# Patient Record
Sex: Female | Born: 1940 | Race: White | Hispanic: No | Smoking: Former smoker
Health system: Southern US, Community
[De-identification: ages and names within clinical notes are randomized; demographics above are authoritative.]

## PROBLEM LIST (undated history)

## (undated) DIAGNOSIS — I471 Supraventricular tachycardia, unspecified: Secondary | ICD-10-CM

## (undated) DIAGNOSIS — E039 Hypothyroidism, unspecified: Secondary | ICD-10-CM

## (undated) DIAGNOSIS — Z8659 Personal history of other mental and behavioral disorders: Secondary | ICD-10-CM

## (undated) DIAGNOSIS — K219 Gastro-esophageal reflux disease without esophagitis: Secondary | ICD-10-CM

## (undated) DIAGNOSIS — R2681 Unsteadiness on feet: Secondary | ICD-10-CM

## (undated) DIAGNOSIS — I5032 Chronic diastolic (congestive) heart failure: Secondary | ICD-10-CM

## (undated) DIAGNOSIS — R519 Headache, unspecified: Secondary | ICD-10-CM

## (undated) DIAGNOSIS — R739 Hyperglycemia, unspecified: Secondary | ICD-10-CM

## (undated) DIAGNOSIS — R1310 Dysphagia, unspecified: Secondary | ICD-10-CM

## (undated) DIAGNOSIS — D649 Anemia, unspecified: Secondary | ICD-10-CM

## (undated) DIAGNOSIS — R002 Palpitations: Secondary | ICD-10-CM

## (undated) DIAGNOSIS — M25551 Pain in right hip: Secondary | ICD-10-CM

## (undated) DIAGNOSIS — G9341 Metabolic encephalopathy: Secondary | ICD-10-CM

## (undated) DIAGNOSIS — G894 Chronic pain syndrome: Secondary | ICD-10-CM

## (undated) DIAGNOSIS — M6281 Muscle weakness (generalized): Secondary | ICD-10-CM

## (undated) DIAGNOSIS — K5900 Constipation, unspecified: Secondary | ICD-10-CM

## (undated) DIAGNOSIS — E0591 Thyrotoxicosis, unspecified with thyrotoxic crisis or storm: Secondary | ICD-10-CM

## (undated) DIAGNOSIS — M4800 Spinal stenosis, site unspecified: Secondary | ICD-10-CM

## (undated) DIAGNOSIS — N183 Chronic kidney disease, stage 3 unspecified: Secondary | ICD-10-CM

## (undated) DIAGNOSIS — R296 Repeated falls: Secondary | ICD-10-CM

## (undated) DIAGNOSIS — M4802 Spinal stenosis, cervical region: Secondary | ICD-10-CM

## (undated) DIAGNOSIS — N390 Urinary tract infection, site not specified: Secondary | ICD-10-CM

## (undated) DIAGNOSIS — R251 Tremor, unspecified: Secondary | ICD-10-CM

## (undated) DIAGNOSIS — G629 Polyneuropathy, unspecified: Secondary | ICD-10-CM

## (undated) DIAGNOSIS — J449 Chronic obstructive pulmonary disease, unspecified: Secondary | ICD-10-CM

## (undated) DIAGNOSIS — G4700 Insomnia, unspecified: Secondary | ICD-10-CM

## (undated) HISTORY — DX: Chronic kidney disease, stage 3 unspecified: N18.30

## (undated) HISTORY — DX: Thyrotoxicosis, unspecified with thyrotoxic crisis or storm: E05.91

## (undated) HISTORY — DX: Spinal stenosis, cervical region: M48.02

## (undated) HISTORY — DX: Anemia, unspecified: D64.9

## (undated) HISTORY — DX: Gastro-esophageal reflux disease without esophagitis: K21.9

## (undated) HISTORY — DX: Dysphagia, unspecified: R13.10

## (undated) HISTORY — DX: Chronic pain syndrome: G89.4

## (undated) HISTORY — DX: Muscle weakness (generalized): M62.81

## (undated) HISTORY — DX: Metabolic encephalopathy: G93.41

## (undated) HISTORY — PX: ANKLE FRACTURE SURGERY: SHX122

## (undated) HISTORY — DX: Hypothyroidism, unspecified: E03.9

## (undated) HISTORY — DX: Chronic diastolic (congestive) heart failure: I50.32

## (undated) HISTORY — DX: Insomnia, unspecified: G47.00

## (undated) HISTORY — DX: Polyneuropathy, unspecified: G62.9

## (undated) HISTORY — DX: Constipation, unspecified: K59.00

## (undated) HISTORY — DX: Unsteadiness on feet: R26.81

## (undated) HISTORY — DX: Repeated falls: R29.6

## (undated) HISTORY — DX: Hyperglycemia, unspecified: R73.9

## (undated) HISTORY — DX: Headache, unspecified: R51.9

## (undated) HISTORY — DX: Supraventricular tachycardia, unspecified: I47.10

## (undated) HISTORY — PX: CATARACT EXTRACTION, BILATERAL: SHX1313

## (undated) HISTORY — DX: Pain in right hip: M25.551

## (undated) HISTORY — DX: Supraventricular tachycardia: I47.1

## (undated) HISTORY — DX: Tremor, unspecified: R25.1

## (undated) HISTORY — DX: Palpitations: R00.2

---

## 1999-08-02 ENCOUNTER — Other Ambulatory Visit: Admission: RE | Admit: 1999-08-02 | Discharge: 1999-08-02 | Payer: Self-pay | Admitting: Family Medicine

## 1999-08-04 ENCOUNTER — Encounter: Payer: Self-pay | Admitting: Family Medicine

## 1999-08-04 ENCOUNTER — Ambulatory Visit (HOSPITAL_COMMUNITY): Admission: RE | Admit: 1999-08-04 | Discharge: 1999-08-04 | Payer: Self-pay | Admitting: Family Medicine

## 1999-08-14 ENCOUNTER — Emergency Department (HOSPITAL_COMMUNITY): Admission: EM | Admit: 1999-08-14 | Discharge: 1999-08-15 | Payer: Self-pay

## 1999-12-16 ENCOUNTER — Ambulatory Visit (HOSPITAL_COMMUNITY): Admission: RE | Admit: 1999-12-16 | Discharge: 1999-12-16 | Payer: Self-pay | Admitting: Family Medicine

## 1999-12-16 ENCOUNTER — Encounter: Payer: Self-pay | Admitting: Family Medicine

## 2000-05-18 ENCOUNTER — Encounter: Payer: Self-pay | Admitting: Family Medicine

## 2000-05-18 ENCOUNTER — Ambulatory Visit (HOSPITAL_COMMUNITY): Admission: RE | Admit: 2000-05-18 | Discharge: 2000-05-18 | Payer: Self-pay | Admitting: Family Medicine

## 2001-04-23 ENCOUNTER — Other Ambulatory Visit: Admission: RE | Admit: 2001-04-23 | Discharge: 2001-04-23 | Payer: Self-pay | Admitting: Family Medicine

## 2001-08-22 ENCOUNTER — Ambulatory Visit (HOSPITAL_COMMUNITY): Admission: RE | Admit: 2001-08-22 | Discharge: 2001-08-22 | Payer: Self-pay | Admitting: Family Medicine

## 2001-08-22 ENCOUNTER — Encounter: Payer: Self-pay | Admitting: Family Medicine

## 2003-05-09 ENCOUNTER — Encounter: Admission: RE | Admit: 2003-05-09 | Discharge: 2003-05-09 | Payer: Self-pay | Admitting: Family Medicine

## 2007-08-17 ENCOUNTER — Emergency Department (HOSPITAL_COMMUNITY): Admission: EM | Admit: 2007-08-17 | Discharge: 2007-08-17 | Payer: Self-pay | Admitting: Emergency Medicine

## 2008-04-02 ENCOUNTER — Encounter (HOSPITAL_COMMUNITY): Admission: RE | Admit: 2008-04-02 | Discharge: 2008-07-01 | Payer: Self-pay | Admitting: Gastroenterology

## 2008-04-29 ENCOUNTER — Emergency Department (HOSPITAL_COMMUNITY): Admission: EM | Admit: 2008-04-29 | Discharge: 2008-04-29 | Payer: Self-pay | Admitting: Emergency Medicine

## 2009-03-18 ENCOUNTER — Emergency Department (HOSPITAL_COMMUNITY): Admission: EM | Admit: 2009-03-18 | Discharge: 2009-03-19 | Payer: Self-pay | Admitting: Emergency Medicine

## 2010-04-05 LAB — BASIC METABOLIC PANEL
BUN: 14 mg/dL (ref 6–23)
Calcium: 9 mg/dL (ref 8.4–10.5)
Glucose, Bld: 116 mg/dL — ABNORMAL HIGH (ref 70–99)

## 2010-04-05 LAB — DIFFERENTIAL
Eosinophils Relative: 0 % (ref 0–5)
Lymphocytes Relative: 7 % — ABNORMAL LOW (ref 12–46)
Lymphs Abs: 0.8 10*3/uL (ref 0.7–4.0)
Monocytes Absolute: 0.8 10*3/uL (ref 0.1–1.0)
Neutro Abs: 9.8 10*3/uL — ABNORMAL HIGH (ref 1.7–7.7)
Neutrophils Relative %: 85 % — ABNORMAL HIGH (ref 43–77)

## 2010-04-05 LAB — CBC
Hemoglobin: 9.7 g/dL — ABNORMAL LOW (ref 12.0–15.0)
RBC: 3.13 MIL/uL — ABNORMAL LOW (ref 3.87–5.11)
WBC: 11.4 10*3/uL — ABNORMAL HIGH (ref 4.0–10.5)

## 2010-04-21 LAB — COMPREHENSIVE METABOLIC PANEL
ALT: 15 U/L (ref 0–35)
AST: 31 U/L (ref 0–37)
Albumin: 3.5 g/dL (ref 3.5–5.2)
BUN: 20 mg/dL (ref 6–23)
Calcium: 8.8 mg/dL (ref 8.4–10.5)
Chloride: 96 mEq/L (ref 96–112)
GFR calc Af Amer: 38 mL/min — ABNORMAL LOW (ref >60)
GFR calc non Af Amer: 31 mL/min — ABNORMAL LOW (ref >60)
Potassium: 4.3 mEq/L (ref 3.5–5.1)

## 2010-04-21 LAB — URINALYSIS, ROUTINE W REFLEX MICROSCOPIC
Bilirubin Urine: NEGATIVE
Glucose, UA: NEGATIVE mg/dL
Ketones, ur: NEGATIVE mg/dL
Nitrite: NEGATIVE
Protein, ur: NEGATIVE mg/dL
Specific Gravity, Urine: 1.013 (ref 1.005–1.030)
Urobilinogen, UA: 0.2 mg/dL (ref 0.0–1.0)

## 2010-04-21 LAB — DIFFERENTIAL
Basophils Absolute: 0 10*3/uL (ref 0.0–0.1)
Eosinophils Absolute: 0.1 10*3/uL (ref 0.0–0.7)
Monocytes Absolute: 0.4 10*3/uL (ref 0.1–1.0)
Monocytes Relative: 5 % (ref 3–12)
Neutrophils Relative %: 77 % (ref 43–77)

## 2010-04-21 LAB — MAGNESIUM: Magnesium: 2.2 mg/dL (ref 1.5–2.5)

## 2010-04-21 LAB — CBC
Hemoglobin: 10.8 g/dL — ABNORMAL LOW (ref 12.0–15.0)
MCHC: 33.3 g/dL (ref 30.0–36.0)
MCV: 84 fL (ref 78.0–100.0)
RDW: 20.3 % — ABNORMAL HIGH (ref 11.5–15.5)

## 2010-04-21 LAB — URINE MICROSCOPIC-ADD ON

## 2010-04-22 LAB — ABO/RH: ABO/RH(D): O POS

## 2010-04-22 LAB — CROSSMATCH: Antibody Screen: NEGATIVE

## 2015-11-10 ENCOUNTER — Observation Stay (HOSPITAL_COMMUNITY): Payer: Medicare Other

## 2015-11-10 ENCOUNTER — Inpatient Hospital Stay (HOSPITAL_BASED_OUTPATIENT_CLINIC_OR_DEPARTMENT_OTHER)
Admission: EM | Admit: 2015-11-10 | Discharge: 2015-11-20 | DRG: 871 | Disposition: A | Discharge status: Skilled Nursing Facility | Payer: Medicare Other | Attending: Internal Medicine | Admitting: Internal Medicine

## 2015-11-10 ENCOUNTER — Emergency Department (HOSPITAL_BASED_OUTPATIENT_CLINIC_OR_DEPARTMENT_OTHER): Payer: Medicare Other

## 2015-11-10 ENCOUNTER — Emergency Department (HOSPITAL_COMMUNITY): Payer: Medicare Other

## 2015-11-10 ENCOUNTER — Encounter (HOSPITAL_BASED_OUTPATIENT_CLINIC_OR_DEPARTMENT_OTHER): Payer: Self-pay

## 2015-11-10 DIAGNOSIS — R739 Hyperglycemia, unspecified: Secondary | ICD-10-CM | POA: Diagnosis present

## 2015-11-10 DIAGNOSIS — G894 Chronic pain syndrome: Secondary | ICD-10-CM | POA: Diagnosis present

## 2015-11-10 DIAGNOSIS — A419 Sepsis, unspecified organism: Principal | ICD-10-CM

## 2015-11-10 DIAGNOSIS — E43 Unspecified severe protein-calorie malnutrition: Secondary | ICD-10-CM | POA: Insufficient documentation

## 2015-11-10 DIAGNOSIS — R410 Disorientation, unspecified: Secondary | ICD-10-CM | POA: Diagnosis present

## 2015-11-10 DIAGNOSIS — K56699 Other intestinal obstruction unspecified as to partial versus complete obstruction: Secondary | ICD-10-CM | POA: Diagnosis present

## 2015-11-10 DIAGNOSIS — M1990 Unspecified osteoarthritis, unspecified site: Secondary | ICD-10-CM | POA: Diagnosis present

## 2015-11-10 DIAGNOSIS — I471 Supraventricular tachycardia: Secondary | ICD-10-CM | POA: Diagnosis present

## 2015-11-10 DIAGNOSIS — J449 Chronic obstructive pulmonary disease, unspecified: Secondary | ICD-10-CM | POA: Diagnosis present

## 2015-11-10 DIAGNOSIS — R509 Fever, unspecified: Secondary | ICD-10-CM | POA: Diagnosis not present

## 2015-11-10 DIAGNOSIS — E0590 Thyrotoxicosis, unspecified without thyrotoxic crisis or storm: Secondary | ICD-10-CM | POA: Diagnosis present

## 2015-11-10 DIAGNOSIS — D649 Anemia, unspecified: Secondary | ICD-10-CM

## 2015-11-10 DIAGNOSIS — Z833 Family history of diabetes mellitus: Secondary | ICD-10-CM | POA: Diagnosis not present

## 2015-11-10 DIAGNOSIS — F329 Major depressive disorder, single episode, unspecified: Secondary | ICD-10-CM | POA: Diagnosis present

## 2015-11-10 DIAGNOSIS — R5383 Other fatigue: Secondary | ICD-10-CM

## 2015-11-10 DIAGNOSIS — E878 Other disorders of electrolyte and fluid balance, not elsewhere classified: Secondary | ICD-10-CM | POA: Diagnosis present

## 2015-11-10 DIAGNOSIS — N390 Urinary tract infection, site not specified: Secondary | ICD-10-CM | POA: Diagnosis present

## 2015-11-10 DIAGNOSIS — R0902 Hypoxemia: Secondary | ICD-10-CM | POA: Diagnosis not present

## 2015-11-10 DIAGNOSIS — K591 Functional diarrhea: Secondary | ICD-10-CM

## 2015-11-10 DIAGNOSIS — Z87440 Personal history of urinary (tract) infections: Secondary | ICD-10-CM | POA: Diagnosis not present

## 2015-11-10 DIAGNOSIS — I9589 Other hypotension: Secondary | ICD-10-CM | POA: Diagnosis present

## 2015-11-10 DIAGNOSIS — R770 Abnormality of albumin: Secondary | ICD-10-CM | POA: Diagnosis not present

## 2015-11-10 DIAGNOSIS — J9601 Acute respiratory failure with hypoxia: Secondary | ICD-10-CM | POA: Diagnosis present

## 2015-11-10 DIAGNOSIS — I129 Hypertensive chronic kidney disease with stage 1 through stage 4 chronic kidney disease, or unspecified chronic kidney disease: Secondary | ICD-10-CM | POA: Diagnosis present

## 2015-11-10 DIAGNOSIS — F419 Anxiety disorder, unspecified: Secondary | ICD-10-CM | POA: Diagnosis present

## 2015-11-10 DIAGNOSIS — I959 Hypotension, unspecified: Secondary | ICD-10-CM | POA: Diagnosis not present

## 2015-11-10 DIAGNOSIS — E860 Dehydration: Secondary | ICD-10-CM | POA: Diagnosis present

## 2015-11-10 DIAGNOSIS — I10 Essential (primary) hypertension: Secondary | ICD-10-CM | POA: Diagnosis not present

## 2015-11-10 DIAGNOSIS — R188 Other ascites: Secondary | ICD-10-CM | POA: Diagnosis present

## 2015-11-10 DIAGNOSIS — Z87891 Personal history of nicotine dependence: Secondary | ICD-10-CM

## 2015-11-10 DIAGNOSIS — I493 Ventricular premature depolarization: Secondary | ICD-10-CM | POA: Diagnosis present

## 2015-11-10 DIAGNOSIS — R197 Diarrhea, unspecified: Secondary | ICD-10-CM | POA: Diagnosis not present

## 2015-11-10 DIAGNOSIS — R64 Cachexia: Secondary | ICD-10-CM | POA: Diagnosis present

## 2015-11-10 DIAGNOSIS — G9341 Metabolic encephalopathy: Secondary | ICD-10-CM | POA: Diagnosis present

## 2015-11-10 DIAGNOSIS — R6521 Severe sepsis with septic shock: Secondary | ICD-10-CM | POA: Diagnosis present

## 2015-11-10 DIAGNOSIS — K5730 Diverticulosis of large intestine without perforation or abscess without bleeding: Secondary | ICD-10-CM | POA: Diagnosis present

## 2015-11-10 DIAGNOSIS — G9340 Encephalopathy, unspecified: Secondary | ICD-10-CM | POA: Diagnosis not present

## 2015-11-10 DIAGNOSIS — Z79899 Other long term (current) drug therapy: Secondary | ICD-10-CM

## 2015-11-10 DIAGNOSIS — I491 Atrial premature depolarization: Secondary | ICD-10-CM

## 2015-11-10 DIAGNOSIS — F418 Other specified anxiety disorders: Secondary | ICD-10-CM | POA: Diagnosis not present

## 2015-11-10 DIAGNOSIS — E861 Hypovolemia: Secondary | ICD-10-CM | POA: Diagnosis present

## 2015-11-10 DIAGNOSIS — N289 Disorder of kidney and ureter, unspecified: Secondary | ICD-10-CM | POA: Diagnosis not present

## 2015-11-10 DIAGNOSIS — N179 Acute kidney failure, unspecified: Secondary | ICD-10-CM | POA: Diagnosis present

## 2015-11-10 DIAGNOSIS — Z6820 Body mass index (BMI) 20.0-20.9, adult: Secondary | ICD-10-CM

## 2015-11-10 DIAGNOSIS — R000 Tachycardia, unspecified: Secondary | ICD-10-CM

## 2015-11-10 DIAGNOSIS — E876 Hypokalemia: Secondary | ICD-10-CM | POA: Diagnosis not present

## 2015-11-10 DIAGNOSIS — E872 Acidosis: Secondary | ICD-10-CM | POA: Diagnosis present

## 2015-11-10 DIAGNOSIS — N183 Chronic kidney disease, stage 3 unspecified: Secondary | ICD-10-CM | POA: Diagnosis present

## 2015-11-10 DIAGNOSIS — I4719 Other supraventricular tachycardia: Secondary | ICD-10-CM | POA: Diagnosis present

## 2015-11-10 DIAGNOSIS — Z681 Body mass index (BMI) 19 or less, adult: Secondary | ICD-10-CM | POA: Diagnosis not present

## 2015-11-10 DIAGNOSIS — R531 Weakness: Secondary | ICD-10-CM

## 2015-11-10 DIAGNOSIS — R9431 Abnormal electrocardiogram [ECG] [EKG]: Secondary | ICD-10-CM | POA: Diagnosis not present

## 2015-11-10 DIAGNOSIS — D509 Iron deficiency anemia, unspecified: Secondary | ICD-10-CM | POA: Diagnosis present

## 2015-11-10 DIAGNOSIS — Z8249 Family history of ischemic heart disease and other diseases of the circulatory system: Secondary | ICD-10-CM | POA: Diagnosis not present

## 2015-11-10 DIAGNOSIS — D631 Anemia in chronic kidney disease: Secondary | ICD-10-CM | POA: Diagnosis present

## 2015-11-10 DIAGNOSIS — R946 Abnormal results of thyroid function studies: Secondary | ICD-10-CM | POA: Diagnosis not present

## 2015-11-10 DIAGNOSIS — R5381 Other malaise: Secondary | ICD-10-CM | POA: Diagnosis not present

## 2015-11-10 DIAGNOSIS — R638 Other symptoms and signs concerning food and fluid intake: Secondary | ICD-10-CM | POA: Diagnosis not present

## 2015-11-10 DIAGNOSIS — R0600 Dyspnea, unspecified: Secondary | ICD-10-CM | POA: Diagnosis not present

## 2015-11-10 HISTORY — DX: Spinal stenosis, site unspecified: M48.00

## 2015-11-10 HISTORY — DX: Chronic obstructive pulmonary disease, unspecified: J44.9

## 2015-11-10 HISTORY — DX: Personal history of other mental and behavioral disorders: Z86.59

## 2015-11-10 HISTORY — DX: Urinary tract infection, site not specified: N39.0

## 2015-11-10 LAB — HEPATIC FUNCTION PANEL
ALK PHOS: 68 U/L (ref 25–125)
ALT: 9 U/L (ref 7–35)
AST: 12 U/L — AB (ref 13–35)
BILIRUBIN, TOTAL: 0.6 mg/dL

## 2015-11-10 LAB — IRON AND TIBC
Iron: 9 ug/dL — ABNORMAL LOW (ref 28–170)
SATURATION RATIOS: 6 % — AB (ref 10.4–31.8)
TIBC: 153 ug/dL — AB (ref 250–450)
UIBC: 144 ug/dL

## 2015-11-10 LAB — BASIC METABOLIC PANEL
BUN: 17 mg/dL (ref 4–21)
CREATININE: 1.6 mg/dL — AB (ref 0.5–1.1)
Glucose: 115 mg/dL
Potassium: 3.6 mmol/L (ref 3.4–5.3)
SODIUM: 136 mmol/L — AB (ref 137–147)

## 2015-11-10 LAB — COMPREHENSIVE METABOLIC PANEL
ALBUMIN: 2.5 g/dL — AB (ref 3.5–5.0)
ALK PHOS: 68 U/L (ref 38–126)
ALT: 9 U/L — ABNORMAL LOW (ref 14–54)
ANION GAP: 9 (ref 5–15)
AST: 12 U/L — ABNORMAL LOW (ref 15–41)
BILIRUBIN TOTAL: 0.6 mg/dL (ref 0.3–1.2)
BUN: 17 mg/dL (ref 6–20)
CALCIUM: 8.1 mg/dL — AB (ref 8.9–10.3)
CO2: 25 mmol/L (ref 22–32)
Chloride: 102 mmol/L (ref 101–111)
Creatinine, Ser: 1.56 mg/dL — ABNORMAL HIGH (ref 0.44–1.00)
GFR calc Af Amer: 36 mL/min — ABNORMAL LOW (ref >60)
GFR, EST NON AFRICAN AMERICAN: 31 mL/min — AB (ref >60)
GLUCOSE: 115 mg/dL — AB (ref 65–99)
POTASSIUM: 3.6 mmol/L (ref 3.5–5.1)
Sodium: 136 mmol/L (ref 135–145)
TOTAL PROTEIN: 6.4 g/dL — AB (ref 6.5–8.1)

## 2015-11-10 LAB — CBC WITH DIFFERENTIAL/PLATELET
Basophils Absolute: 0 10*3/uL (ref 0.0–0.1)
Basophils Relative: 0 %
Eosinophils Absolute: 0 10*3/uL (ref 0.0–0.7)
Eosinophils Relative: 0 %
HEMATOCRIT: 28.2 % — AB (ref 36.0–46.0)
HEMOGLOBIN: 8.5 g/dL — AB (ref 12.0–15.0)
LYMPHS ABS: 1.1 10*3/uL (ref 0.7–4.0)
LYMPHS PCT: 11 %
MCH: 28 pg (ref 26.0–34.0)
MCHC: 30.1 g/dL (ref 30.0–36.0)
MCV: 92.8 fL (ref 78.0–100.0)
MONO ABS: 1.2 10*3/uL — AB (ref 0.1–1.0)
MONOS PCT: 12 %
NEUTROS ABS: 7.5 10*3/uL (ref 1.7–7.7)
NEUTROS PCT: 77 %
Platelets: 351 10*3/uL (ref 150–400)
RBC: 3.04 MIL/uL — ABNORMAL LOW (ref 3.87–5.11)
RDW: 15.5 % (ref 11.5–15.5)
WBC: 9.8 10*3/uL (ref 4.0–10.5)

## 2015-11-10 LAB — BLOOD GAS, ARTERIAL
ACID-BASE DEFICIT: 2.1 mmol/L — AB (ref 0.0–2.0)
BICARBONATE: 21.3 mmol/L (ref 20.0–28.0)
Drawn by: 242311
O2 CONTENT: 2 L/min
O2 SAT: 96.8 %
PATIENT TEMPERATURE: 98.6
PCO2 ART: 31.6 mmHg — AB (ref 32.0–48.0)
PO2 ART: 105 mmHg (ref 83.0–108.0)
pH, Arterial: 7.444 (ref 7.350–7.450)

## 2015-11-10 LAB — URINALYSIS, ROUTINE W REFLEX MICROSCOPIC
Glucose, UA: NEGATIVE mg/dL
Hgb urine dipstick: NEGATIVE
Ketones, ur: 15 mg/dL — AB
Nitrite: NEGATIVE
Protein, ur: 30 mg/dL — AB
Specific Gravity, Urine: 1.018 (ref 1.005–1.030)
pH: 6 (ref 5.0–8.0)

## 2015-11-10 LAB — RAPID URINE DRUG SCREEN, HOSP PERFORMED
AMPHETAMINES: NOT DETECTED
BARBITURATES: NOT DETECTED
BENZODIAZEPINES: POSITIVE — AB
Cocaine: NOT DETECTED
Opiates: NOT DETECTED
Tetrahydrocannabinol: NOT DETECTED

## 2015-11-10 LAB — FOLATE: FOLATE: 21.9 ng/mL (ref >5.9)

## 2015-11-10 LAB — PROCALCITONIN: Procalcitonin: 0.19 ng/mL

## 2015-11-10 LAB — URINE MICROSCOPIC-ADD ON

## 2015-11-10 LAB — TROPONIN I: Troponin I: <0.03 ng/mL

## 2015-11-10 LAB — RETICULOCYTES
RBC.: 3.03 MIL/uL — AB (ref 3.87–5.11)
Retic Count, Absolute: 84.8 10*3/uL (ref 19.0–186.0)
Retic Ct Pct: 2.8 % (ref 0.4–3.1)

## 2015-11-10 LAB — CBC AND DIFFERENTIAL
HEMATOCRIT: 28 % — AB (ref 36–46)
Hemoglobin: 8.5 g/dL — AB (ref 12.0–16.0)
Platelets: 351 10*3/uL (ref 150–399)
WBC: 9.8 10^3/mL

## 2015-11-10 LAB — FERRITIN: Ferritin: 151 ng/mL (ref 11–307)

## 2015-11-10 LAB — OCCULT BLOOD X 1 CARD TO LAB, STOOL: FECAL OCCULT BLD: NEGATIVE

## 2015-11-10 LAB — TSH: TSH: 0.289 u[IU]/mL — ABNORMAL LOW (ref 0.350–4.500)

## 2015-11-10 LAB — VITAMIN B12: VITAMIN B 12: 1511 pg/mL — AB (ref 180–914)

## 2015-11-10 LAB — I-STAT CG4 LACTIC ACID, ED: Lactic Acid, Venous: 1.3 mmol/L (ref 0.5–1.9)

## 2015-11-10 MED ORDER — SODIUM CHLORIDE 0.9 % IV SOLN
Freq: Once | INTRAVENOUS | Status: AC
  Administered 2015-11-10: 03:00:00 via INTRAVENOUS

## 2015-11-10 MED ORDER — ENOXAPARIN SODIUM 30 MG/0.3ML ~~LOC~~ SOLN
30.0000 mg | SUBCUTANEOUS | Status: DC
  Administered 2015-11-10: 30 mg via SUBCUTANEOUS
  Filled 2015-11-10: qty 0.3

## 2015-11-10 MED ORDER — ONDANSETRON HCL 4 MG/2ML IJ SOLN
4.0000 mg | Freq: Four times a day (QID) | INTRAMUSCULAR | Status: DC | PRN
Start: 2015-11-10 — End: 2015-11-20

## 2015-11-10 MED ORDER — DEXTROSE 5 % IV SOLN
2.0000 g | Freq: Once | INTRAVENOUS | Status: AC
  Administered 2015-11-10: 2 g via INTRAVENOUS
  Filled 2015-11-10: qty 2

## 2015-11-10 MED ORDER — SODIUM CHLORIDE 0.9 % IV BOLUS (SEPSIS)
500.0000 mL | Freq: Once | INTRAVENOUS | Status: AC
  Administered 2015-11-10: 500 mL via INTRAVENOUS

## 2015-11-10 MED ORDER — TRAZODONE HCL 50 MG PO TABS
50.0000 mg | ORAL_TABLET | Freq: Every day | ORAL | Status: DC
  Administered 2015-11-10: 50 mg via ORAL
  Filled 2015-11-10: qty 1

## 2015-11-10 MED ORDER — ACETAMINOPHEN 325 MG PO TABS
650.0000 mg | ORAL_TABLET | Freq: Four times a day (QID) | ORAL | Status: DC | PRN
  Administered 2015-11-10 – 2015-11-11 (×3): 650 mg via ORAL
  Filled 2015-11-10 (×3): qty 2

## 2015-11-10 MED ORDER — SODIUM CHLORIDE 0.9 % IV SOLN
Freq: Once | INTRAVENOUS | Status: AC
  Administered 2015-11-10: 1000 mL via INTRAVENOUS

## 2015-11-10 MED ORDER — SODIUM CHLORIDE 0.9 % IV SOLN
INTRAVENOUS | Status: DC
  Administered 2015-11-10 – 2015-11-11 (×3): via INTRAVENOUS

## 2015-11-10 MED ORDER — ALPRAZOLAM 0.5 MG PO TABS: 0.5000 mg | ORAL_TABLET | Freq: Three times a day (TID) | ORAL | Status: DC

## 2015-11-10 MED ORDER — ONDANSETRON HCL 4 MG PO TABS: 4.0000 mg | ORAL_TABLET | Freq: Four times a day (QID) | ORAL | Status: DC | PRN

## 2015-11-10 MED ORDER — OXYCODONE HCL 5 MG PO TABS: 5.0000 mg | ORAL_TABLET | ORAL | Status: DC | PRN

## 2015-11-10 MED ORDER — CEFTRIAXONE SODIUM 1 G IJ SOLR
1.0000 g | INTRAMUSCULAR | Status: DC
  Filled 2015-11-10: qty 10

## 2015-11-10 MED ORDER — SODIUM CHLORIDE 0.9 % IV SOLN
510.0000 mg | Freq: Once | INTRAVENOUS | Status: AC
  Administered 2015-11-10: 510 mg via INTRAVENOUS
  Filled 2015-11-10: qty 17

## 2015-11-10 MED ORDER — ACETAMINOPHEN 650 MG RE SUPP
650.0000 mg | Freq: Four times a day (QID) | RECTAL | Status: DC | PRN
  Filled 2015-11-10: qty 1

## 2015-11-10 MED ORDER — ENSURE ENLIVE PO LIQD
237.0000 mL | Freq: Two times a day (BID) | ORAL | Status: DC
  Administered 2015-11-10 – 2015-11-11 (×3): 237 mL via ORAL

## 2015-11-10 MED ORDER — ALPRAZOLAM 0.5 MG PO TABS
0.5000 mg | ORAL_TABLET | Freq: Three times a day (TID) | ORAL | Status: DC | PRN
  Administered 2015-11-11 (×2): 0.5 mg via ORAL
  Filled 2015-11-10 (×2): qty 1

## 2015-11-10 NOTE — Progress Notes (Signed)
Pharmacy Antibiotic Note  Dawn [name] is a 75 y.o. female admitted on 11/10/2015 with AMS of unknown origin.  Pharmacy has been consulted for Ceftriaxone dosing for UTI.   Recent UTI, possibly inadequately treated. Rx'd with Macrobid 100 mg QID x 7 days. Confirmed Rx with [facility]. Filled on 10/23, completed 10/29.  Long-acting formulation usually dosed BID.   Plan:  Ceftriaxone 2gm IV x 1 given.  Will continue with Ceftriaxone 1 gm IV q24hrs.  No adjustment needed for renal function, but will follow up culture.  Height: '5\' 8"'$  (172.7 cm) Weight: 128 lb (58.1 kg) IBW/kg (Calculated) : 63.9  Temp (24hrs), Avg:98.4 F (36.9 C), Min:97.5 F (36.4 C), Max:99.4 F (37.4 C)   Recent Labs Lab 11/10/15 0220 11/10/15 0231  WBC 9.8  --   CREATININE 1.56*  --   LATICACIDVEN  --  1.30    Estimated Creatinine Clearance: 28.1 mL/min (by C-G formula based on SCr of 1.56 mg/dL (H)).    No Known Allergies  Antimicrobials this admission: Ceftriaxone 10/31>> Outpt Macrodantin 10/23>>10/29  Dose adjustments this admission:  n/a  Microbiology results: 10/31 urine -   10/31 blood x 2 -  Thank you for allowing pharmacy to be a part of this patient's care.  Arty Baumgartner, [place] Pager: [phone] 11/10/2015 7:21 PM

## 2015-11-10 NOTE — ED Triage Notes (Signed)
Per daughter, pt was treated for UTI, completed antibiotics, still not any better; states pt is more confused, not eating, drinking, not urinating; pt appears confused unable to follow commands

## 2015-11-10 NOTE — H&P (Signed)
History and Physical    Dawn [name] PYP:[id number] DOB: Jul 14, 1940 DOA: 11/10/2015   PCP: [name], MD   Patient coming from/Resides with: Private residence/lives with daughter  Admission status: Observation/telemetry -needs to be reevaluated within the next 24 hours to determine if it will be medically necessary to stay a minimum 2 midnights to rule out impending and/or unexpected changes in physiologic status that may differ from initial evaluation performed in the ER and/or at time of admission. Presents with altered mental status confusion and lethargy with signs of acute kidney injury and apparent progressive anemia. Has developed mild hypoxemia (room air 87%) since arrival to ED which has persisted upon arrival to the medical floor. Etiology to symptoms unclear prompting inpatient evaluation. Patient will need IV fluids and close neurological status monitoring for at least the next 24 hours. She will also require low-dose oxygen until source of hypoxemia elucidated.  Chief Complaint: Altered mental status with confusion and lethargy  HPI: Dawn [name] is a 75 y.o. female with medical history significant for anxiety and depression, osteoarthritis with chronic pain. Patient was recently treated for a UTI about 8 days ago with a "yellow pill". Apparently has worsened for total of 2 weeks with no improvement after treatment of UTI. Daughter brought patient in due to concerns of mental status changes. Patient reports that for the past 4 days she has had's nausea with some vomiting but is unclear about having any diarrhea. She does admit to poor oral intake for several days. She denies shortness of breath or cough.  ED Course:  Vital Signs: BP (!) 108/59 (BP Location: Left Arm)   Pulse 94   Temp 97.9 F (36.6 C) (Oral)   Resp 18   Ht '5\' 8"'$  (1.727 m)   Wt 58.1 kg (128 lb)   SpO2 (!) 87% Comment: hooked to 2L NASAL CANNULA  BMI 19.46 kg/m  CT head without contrast: No  acute intracranial finding. There is moderate generalized atrophy and chronic-appearing white matter hypodensities which likely represents small vessel ischemic disease Lab data: Sodium 136, potassium 3.6, CO2 5, BUN 17, creatinine 1.56, glucose 1:15, albumin 2.5, LFTs elevated, total protein 6.4, troponin less than 0.03, lactic acid 1.3, white count 9800 and normal differential, hemoglobin 8.5, platelets 351,000, urinalysis somewhat abnormal with few bacteria, small bilirubin, hyaline cast, amber color, 15 ketones, leukocytes small, wbc's 0-5, urine drug screen positive for benzodiazepines noting patient takes Xanax at home Medications and treatments: Normal saline bolus 1 L  Review of Systems:  In addition to the HPI above,  No Fever-chills, myalgias or other constitutional symptoms No Headache, changes with Vision or hearing, new weakness, tingling, numbness in any extremity, dizziness, dysarthria or word finding difficulty, gait disturbance or imbalance, tremors or seizure activity No problems swallowing food or Liquids, indigestion/reflux, choking or coughing while eating, abdominal pain with or after eating No Chest pain, Cough or Shortness of Breath, palpitations, orthopnea or DOE No Abdominal pain, melena,hematochezia, dark tarry stools No dysuria, malodorous urine, hematuria or flank pain No new skin rashes, lesions, masses or bruises, No new joint pains, aches, swelling or redness No recent unintentional weight gain or loss ?? No polyuria, polydypsia or polyphagia   Past Medical History:  Diagnosis Date  . COPD (chronic obstructive pulmonary disease) ([employer])   . Spinal stenosis   . UTI (urinary tract infection)     History reviewed. No pertinent surgical history.  Social History   Social History  . Marital status: Divorced  Spouse name: N/A  . Number of children: N/A  . Years of education: N/A   Occupational History  . Not on file.   Social History Main Topics  .  Smoking status: Former Research scientist (life sciences)  . Smokeless tobacco: Never Used  . Alcohol use No  . Drug use: Unknown  . Sexual activity: Not on file   Other Topics Concern  . Not on file   Social History Narrative  . No narrative on file    Mobility: Without assistive devices Work history: Not obtained   No Known Allergies  No family history on file. Family history reviewed and not pertinent to current admission findings  Prior to Admission medications   Medication Sig Start Date End Date Taking? Authorizing Provider  ALPRAZolam Duanne Moron) 1 MG tablet Take 1 mg by mouth 3 (three) times daily as needed for anxiety.   Yes Historical Provider, MD  buPROPion (WELLBUTRIN XL) 150 MG 24 hr tablet Take 150 mg by mouth daily.   Yes Historical Provider, MD  oxyCODONE (ROXICODONE) 15 MG immediate release tablet Take 15 mg by mouth every 4 (four) hours as needed for pain.   Yes Historical Provider, MD  traZODone (DESYREL) 100 MG tablet Take 100 mg by mouth at bedtime.   Yes Historical Provider, MD    Physical Exam: Vitals:   11/10/15 0451 11/10/15 0500 11/10/15 0647 11/10/15 0929  BP: (!) 82/45 (!) 81/49 (!) 108/59 (!) 96/47  Pulse: 96 97 94 (!) 140  Resp: '20 19 18 18  '$ Temp:   97.9 F (36.6 C) 98.7 F (37.1 C)  TempSrc:   Oral Oral  SpO2: 95% 96% (!) 87% 98%  Weight:      Height:          Constitutional: NAD, calm, comfortable-Appears pale Eyes: PERRL, lids and conjunctivae normal ENMT: Mucous membranes are dry. Posterior pharynx clear of any exudate or lesions.Normal dentition.  Neck: normal, supple, no masses, no thyromegaly Respiratory: clear to auscultation bilaterally somewhat diminished throughout, no wheezing, no crackles. Normal respiratory effort. No accessory muscle use. [place] oxygen Cardiovascular: Regular rate and rhythm, no murmurs / rubs / gallops. No extremity edema. 2+ pedal pulses. No carotid bruits.  Abdomen: no tenderness, no masses palpated. No hepatosplenomegaly. Bowel sounds  positive.  Musculoskeletal: no clubbing / cyanosis. No joint deformity upper and lower extremities. Good ROM, no contractures. Normal muscle tone.  Skin: no rashes, lesions, ulcers. No induration Neurologic: CN 2-12 grossly intact. Sensation intact, DTR normal. Strength 5/5 x all 4 extremities.  Psychiatric: Alert and oriented x 3. Normal mood. Having some difficulty clarifying recent as well as remote history seems to have short-term memory deficits   Labs on Admission: I have personally reviewed following labs and imaging studies  CBC:  Recent Labs Lab 11/10/15 0220  WBC 9.8  NEUTROABS 7.5  HGB 8.5*  HCT 28.2*  MCV 92.8  PLT 836   Basic Metabolic Panel:  Recent Labs Lab 11/10/15 0220  NA 136  K 3.6  CL 102  CO2 25  GLUCOSE 115*  BUN 17  CREATININE 1.56*  CALCIUM 8.1*   GFR: Estimated Creatinine Clearance: 28.1 mL/min (by C-G formula based on SCr of 1.56 mg/dL (H)). Liver Function Tests:  Recent Labs Lab 11/10/15 0220  AST 12*  ALT 9*  ALKPHOS 68  BILITOT 0.6  PROT 6.4*  ALBUMIN 2.5*   No results for input(s): LIPASE, AMYLASE in the last 168 hours. No results for input(s): AMMONIA in the last 168 hours.  Coagulation Profile: No results for input(s): INR, PROTIME in the last 168 hours. Cardiac Enzymes:  Recent Labs Lab 11/10/15 0220  TROPONINI <0.03   BNP (last 3 results) No results for input(s): PROBNP in the last 8760 hours. HbA1C: No results for input(s): HGBA1C in the last 72 hours. CBG: No results for input(s): GLUCAP in the last 168 hours. Lipid Profile: No results for input(s): CHOL, HDL, LDLCALC, TRIG, CHOLHDL, LDLDIRECT in the last 72 hours. Thyroid Function Tests: No results for input(s): TSH, T4TOTAL, FREET4, T3FREE, THYROIDAB in the last 72 hours. Anemia Panel: No results for input(s): VITAMINB12, FOLATE, FERRITIN, TIBC, IRON, RETICCTPCT in the last 72 hours. Urine analysis:    Component Value Date/Time   COLORURINE AMBER (A)  11/10/2015 0230   APPEARANCEUR CLEAR 11/10/2015 0230   LABSPEC 1.018 11/10/2015 0230   PHURINE 6.0 11/10/2015 0230   GLUCOSEU NEGATIVE 11/10/2015 0230   HGBUR NEGATIVE 11/10/2015 0230   BILIRUBINUR SMALL (A) 11/10/2015 0230   KETONESUR 15 (A) 11/10/2015 0230   PROTEINUR 30 (A) 11/10/2015 0230   UROBILINOGEN 0.2 04/29/2008 2235   NITRITE NEGATIVE 11/10/2015 0230   LEUKOCYTESUR SMALL (A) 11/10/2015 0230   Sepsis Labs: '@LABRCNTIP'$ (procalcitonin:4,lacticidven:4) )No results found for this or any previous visit (from the past 240 hour(s)).   Radiological Exams on Admission: Ct Head Wo Contrast  Result Date: 11/10/2015 CLINICAL DATA:  Increasing confusion EXAM: CT HEAD WITHOUT CONTRAST TECHNIQUE: Contiguous axial images were obtained from the base of the skull through the vertex without intravenous contrast. COMPARISON:  None. FINDINGS: Brain: There is no intracranial hemorrhage, mass or evidence of acute infarction. There is mild generalized atrophy. There is mild chronic microvascular ischemic change. There is no significant extra-axial fluid collection. No acute intracranial findings are evident. The calvarium and skullbase are intact. Visible paranasal sinuses and orbits are unremarkable. Vascular: No hyperdense vessel or unexpected calcification. Skull: Normal. Negative for fracture or focal lesion. Sinuses/Orbits: Left maxillary sinus air-fluid level IMPRESSION: No acute intracranial findings. There is moderate generalized atrophy and chronic appearing white matter hypodensities which likely represent small vessel ischemic disease. Electronically Signed   By: Andreas Newport M.D.   On: 11/10/2015 04:05    EKG: (Independently reviewed) Sinus rhythm with low voltage complexes in all leads except for before through B6, ventricular rate 98 bpm, QTC 431 ms, no acute ischemic changes  Assessment/Plan Principal Problem:   Acute kidney injury and chronic kidney disease -Patient presents with  acute delirium with reports of recent nausea vomiting and poor oral intake and appears dehydrated with associated acute kidney injury (based on history) -Baseline renal function: 14/1.71 current renal function 17/1.56 -Gentle IV fluid hydration for the next 24 hours  Active Problems:   Acute delirium -Patient with altered mentation worse for several days -Rule out inadequately treated UTI versus other infectious process -Given chronic kidney disease and recent dehydration may not be clearing home medications; patient is on multiple psychotropic medications as well as chronic pain medications all of which I have either held or decreased preadmission dosing   Non sustained narrow complex tachycardia -Patient having bursts of nonsustained tachycardia with rates up to 140-150 bpm (seen on 12 lead in hard copy chart) -Also having frequent PACs-if continues consider prn IV Lopressor (BP soft so may not tolerate PO Lopressor) -No definitive P waves of differential includes SVT, PAF, PAT **TSH low at 0.289 -Continue telemetry -Check echocardiogram -Suspect an underlying COPD may have atrial enlargement contributing to dysrhythmia    Acute respiratory failure with  hypoxia/COPD   -History of prior tobacco abuse -May have a component of undiagnosed chronic hypoxemia -Check chest x-ray to rule out pneumonia -Check ABG -May need ambulatory room-air saturations to determine if meets requirements for home oxygen -Not actively wheezing and does not appear to have exacerbation    HTN -on Diovan at home with baseline BP readings on this med around 90-100 -check OVS in am    Normocytic anemia -Patient reports history of chronic anemia -Current hemoglobin is 8.5 with most recent hemoglobin from 2011 9.7 -Suspect potentially related to progressive chronic kidney disease -Initial FOB negative but we'll continue to check while here -Check TSH and anemia panel **TSH 0.289 and Fe low at 9 so ck Free T4 and  T3 and give one dose Fereheme IV    Recurrent UTI -Recently treated with antibiotic (yellow pill-?? Macrodantin) -Delirium could be related to inadequately treated vs resistant UTI check urine culture and blood cultures -No indication for empiric antibiotics at this juncture -Check Procalcitonin deceitful help clarify-initial lactic acid normal    Anxiety and depression -At home patient takes Wellbutrin 150 mg at hour of sleep, Xanax 1 mg 3 times a day as needed, oxycodone 15 mg every 4 hours as needed for pain and trazodone 100 mg at bedtime -I have held Wellbutrin, decreased trazodone to 50 mg, decreased oxycodone 5 mg every 4 hours as needed, and decrease Xanax to 0.5 mg as needed    Chronic pain syndrome -Reports has chronic pain related to osteoarthritis but was unable to tell me if she has chronic back pain, arm pain shoulder pain or knee pain -Chronic narcotics adjusted as above   Acute hyperglycemia -HgbA1c      DVT prophylaxis: Lovenox Code Status: Full Family Communication: Attending M.D. spoke with patient's daughter  Disposition Plan: Anticipate discharge back to preadmission home environment once medically stable Consults called: None    ELLIS,ALLISON L. ANP-BC [facility] Pager [phone]   If 7PM-7AM, please contact night-coverage [email or web address] Password [email or web address]  11/10/2015, 9:32 AM

## 2015-11-10 NOTE — ED Provider Notes (Addendum)
[facility] Provider Note: Dawn Spurling, MD, FACEP  CSN: [id number] MRN: [id number] ARRIVAL: 11/10/15 at [place]  Altered Mental Status  Level V caveat: Altered mental status HISTORY OF PRESENT ILLNESS  Dawn [name] is a 75 y.o. female who has had a general decline over the past 3 weeks. She has become increasingly weak, lethargic and confused. She was seen by her PCP 2 weeks ago and again 8 days ago. On the second visit she was diagnosed with urinary tract infection and was placed on Macrobid. She has had 4 days of nausea and vomiting but it is unclear if she has had associated diarrhea. She has had little to eat or drink. She has had decreased urine output.  She has been very resistant to coming to the ED but her daughter, who remained persistent, was able to bring her to the ED this morning. She required assistance to ambulate to the car. Nursing staff noted her to be confused with difficulty following commands on initial evaluation. The patient states she only took a Xanax this evening.   Past Medical History:  Diagnosis Date  . COPD (chronic obstructive pulmonary disease) ([place])   . Spinal stenosis   . UTI (urinary tract infection)     History reviewed. No pertinent surgical history.  No family history on file.  Social History  Substance Use Topics  . Smoking status: Former Research scientist (life sciences)  . Smokeless tobacco: Never Used  . Alcohol use No    Prior to Admission medications   Medication Sig Start Date End Date Taking? Authorizing Provider  ALPRAZolam Duanne Moron) 1 MG tablet Take 1 mg by mouth 3 (three) times daily as needed for anxiety.   Yes Historical Provider, MD  buPROPion (WELLBUTRIN XL) 150 MG 24 hr tablet Take 150 mg by mouth daily.   Yes Historical Provider, MD  oxyCODONE (ROXICODONE) 15 MG immediate release tablet Take 15 mg by mouth every 4 (four) hours as needed for pain.   Yes Historical Provider, MD  traZODone (DESYREL)  100 MG tablet Take 100 mg by mouth at bedtime.   Yes Historical Provider, MD    Allergies Review of patient's allergies indicates no known allergies.   REVIEW OF SYSTEMS  Level V caveat: Altered mental status   PHYSICAL EXAMINATION  Initial Vital Signs Blood pressure 91/67, pulse 94, temperature 97.5 F (36.4 C), temperature source Oral, resp. rate 18, height '5\' 8"'$  (1.727 m), weight 128 lb (58.1 kg), SpO2 98 %.  Examination General: Well-developed, cachectic female in no acute distress; appearance consistent with age of record HENT: normocephalic; atraumatic; mucous membranes dry Eyes: pupils equal, round and reactive to light; extraocular muscles could not be assessed due to patient's mental status Neck: supple Heart: regular rate and rhythm; frequent PACs Lungs: clear to auscultation bilaterally Abdomen: soft; nondistended; nontender; no masses or hepatosplenomegaly; bowel sounds present Extremities: No deformity; full range of motion; pulses weak Neurologic: Awake, alert and oriented to person and place, year but not month or day, states that Merrilyn Puma is the president; noted to move all extremities without gross focal deficit; no facial droop Skin: Warm and dry   RESULTS  Summary of this visit's results, reviewed by myself:   EKG Interpretation  Date/Time:  Tuesday November 10 2015 02:26:11 EDT Ventricular Rate:  94 PR Interval:    QRS Duration: 94 QT Interval:  369 QTC Calculation: 462 R Axis:   71 Text Interpretation:  Sinus tachycardia Multiple premature complexes  Low voltage, extremity leads RSR' in V1 or V2, right VCD or RVH Nonspecific T abnormalities, anterior leads No previous ECGs available Confirmed by [facility]  MD, Jenny Reichmann ([id number]) on 11/10/2015 3:05:09 AM      Laboratory Studies: Results for orders placed or performed during the hospital encounter of 11/10/15 (from the past 24 hour(s))  CBC with Differential     Status: Abnormal   Collection Time: 11/10/15  2:20  AM  Result Value Ref Range   WBC 9.8 4.0 - 10.5 K/uL   RBC 3.04 (L) 3.87 - 5.11 MIL/uL   Hemoglobin 8.5 (L) 12.0 - 15.0 g/dL   HCT 28.2 (L) 36.0 - 46.0 %   MCV 92.8 78.0 - 100.0 fL   MCH 28.0 26.0 - 34.0 pg   MCHC 30.1 30.0 - 36.0 g/dL   RDW 15.5 11.5 - 15.5 %   Platelets 351 150 - 400 K/uL   Neutrophils Relative % 77 %   Neutro Abs 7.5 1.7 - 7.7 K/uL   Lymphocytes Relative 11 %   Lymphs Abs 1.1 0.7 - 4.0 K/uL   Monocytes Relative 12 %   Monocytes Absolute 1.2 (H) 0.1 - 1.0 K/uL   Eosinophils Relative 0 %   Eosinophils Absolute 0.0 0.0 - 0.7 K/uL   Basophils Relative 0 %   Basophils Absolute 0.0 0.0 - 0.1 K/uL  Comprehensive metabolic panel     Status: Abnormal   Collection Time: 11/10/15  2:20 AM  Result Value Ref Range   Sodium 136 135 - 145 mmol/L   Potassium 3.6 3.5 - 5.1 mmol/L   Chloride 102 101 - 111 mmol/L   CO2 25 22 - 32 mmol/L   Glucose, Bld 115 (H) 65 - 99 mg/dL   BUN 17 6 - 20 mg/dL   Creatinine, Ser 1.56 (H) 0.44 - 1.00 mg/dL   Calcium 8.1 (L) 8.9 - 10.3 mg/dL   Total Protein 6.4 (L) 6.5 - 8.1 g/dL   Albumin 2.5 (L) 3.5 - 5.0 g/dL   AST 12 (L) 15 - 41 U/L   ALT 9 (L) 14 - 54 U/L   Alkaline Phosphatase 68 38 - 126 U/L   Total Bilirubin 0.6 0.3 - 1.2 mg/dL   GFR calc non Af Amer 31 (L) >60 mL/min   GFR calc Af Amer 36 (L) >60 mL/min   Anion gap 9 5 - 15  Troponin I     Status: None   Collection Time: 11/10/15  2:20 AM  Result Value Ref Range   Troponin I <0.03 <0.03 ng/mL  Urinalysis, Routine w reflex microscopic (not at [facility])     Status: Abnormal   Collection Time: 11/10/15  2:30 AM  Result Value Ref Range   Color, Urine AMBER (A) YELLOW   APPearance CLEAR CLEAR   Specific Gravity, Urine 1.018 1.005 - 1.030   pH 6.0 5.0 - 8.0   Glucose, UA NEGATIVE NEGATIVE mg/dL   Hgb urine dipstick NEGATIVE NEGATIVE   Bilirubin Urine SMALL (A) NEGATIVE   Ketones, ur 15 (A) NEGATIVE mg/dL   Protein, ur 30 (A) NEGATIVE mg/dL   Nitrite NEGATIVE NEGATIVE    Leukocytes, UA SMALL (A) NEGATIVE  Urine microscopic-add on     Status: Abnormal   Collection Time: 11/10/15  2:30 AM  Result Value Ref Range   Squamous Epithelial / LPF 0-5 (A) NONE SEEN   WBC, UA 0-5 0 - 5 WBC/hpf   RBC / HPF 0-5 0 - 5 RBC/hpf   Bacteria, UA  FEW (A) NONE SEEN   Casts HYALINE CASTS (A) NEGATIVE  Rapid urine drug screen (hospital performed)     Status: Abnormal   Collection Time: 11/10/15  2:30 AM  Result Value Ref Range   Opiates NONE DETECTED NONE DETECTED   Cocaine NONE DETECTED NONE DETECTED   Benzodiazepines POSITIVE (A) NONE DETECTED   Amphetamines NONE DETECTED NONE DETECTED   Tetrahydrocannabinol NONE DETECTED NONE DETECTED   Barbiturates NONE DETECTED NONE DETECTED  I-Stat CG4 Lactic Acid, ED     Status: None   Collection Time: 11/10/15  2:31 AM  Result Value Ref Range   Lactic Acid, Venous 1.30 0.5 - 1.9 mmol/L  Occult blood card to lab, stool Provider will collect     Status: None   Collection Time: 11/10/15  3:05 AM  Result Value Ref Range   Fecal Occult Bld NEGATIVE NEGATIVE   Imaging Studies: Ct Head Wo Contrast  Result Date: 11/10/2015 CLINICAL DATA:  Increasing confusion EXAM: CT HEAD WITHOUT CONTRAST TECHNIQUE: Contiguous axial images were obtained from the base of the skull through the vertex without intravenous contrast. COMPARISON:  None. FINDINGS: Brain: There is no intracranial hemorrhage, mass or evidence of acute infarction. There is mild generalized atrophy. There is mild chronic microvascular ischemic change. There is no significant extra-axial fluid collection. No acute intracranial findings are evident. The calvarium and skullbase are intact. Visible paranasal sinuses and orbits are unremarkable. Vascular: No hyperdense vessel or unexpected calcification. Skull: Normal. Negative for fracture or focal lesion. Sinuses/Orbits: Left maxillary sinus air-fluid level IMPRESSION: No acute intracranial findings. There is moderate generalized atrophy  and chronic appearing white matter hypodensities which likely represent small vessel ischemic disease. Electronically Signed   By: Andreas Newport M.D.   On: 11/10/2015 04:05    ED COURSE  Nursing notes and initial vitals signs, including pulse oximetry, reviewed.  Vitals:   11/10/15 0300 11/10/15 0330 11/10/15 0400 11/10/15 0430  BP: (!) 84/50 100/58 91/68 (!) 79/44  Pulse:      Resp: '19 20 23 20  '$ Temp:      TempSrc:      SpO2:      Weight:      Height:       3:02 AM Normal saline one liter bolus initiated.  4:21 AM Dr. Alcario Drought accepts for admission to [facility]    ED DIAGNOSES     ICD-9-CM ICD-10-CM   1. Generalized weakness 780.79 R53.1   2. Confusion 298.9 R41.0 CT HEAD WO CONTRAST     CT HEAD WO CONTRAST  3. Anemia, unspecified type 285.9 D64.9   4. Premature atrial contractions 427.61 I49.1   5. Renal insufficiency 593.9 N28.9        Shanon Rosser, MD 11/10/15 0422    Shanon Rosser, MD 11/10/15 [phone]

## 2015-11-10 NOTE — Plan of Care (Signed)
75 yo F with AMS, confusion, lethargy.  Progressively worse over past 2 weeks.  Recently treated for UTI 8 days ago.  Now has 4 days of N/V and ? Diarrhea.  Sounds like Delirium.  Unclear source of what is causing her delirium at this point though as UTI appears resolved.  Patient going to med surg.

## 2015-11-10 NOTE — ED Notes (Addendum)
Per family member pt being tx for uti  Finished meds this past Saturday but is still weak, increased confusion diff w urination x 2 weeks and no improvement  Unsteady gait   Pt not eating or drinking fluids

## 2015-11-11 ENCOUNTER — Encounter (HOSPITAL_COMMUNITY): Payer: Self-pay | Admitting: Pulmonary Disease

## 2015-11-11 ENCOUNTER — Other Ambulatory Visit (HOSPITAL_COMMUNITY): Payer: Medicare Other

## 2015-11-11 DIAGNOSIS — G9340 Encephalopathy, unspecified: Secondary | ICD-10-CM

## 2015-11-11 DIAGNOSIS — R6521 Severe sepsis with septic shock: Secondary | ICD-10-CM

## 2015-11-11 DIAGNOSIS — E876 Hypokalemia: Secondary | ICD-10-CM

## 2015-11-11 DIAGNOSIS — A419 Sepsis, unspecified organism: Secondary | ICD-10-CM

## 2015-11-11 DIAGNOSIS — I471 Supraventricular tachycardia: Secondary | ICD-10-CM

## 2015-11-11 DIAGNOSIS — D649 Anemia, unspecified: Secondary | ICD-10-CM

## 2015-11-11 LAB — COMPREHENSIVE METABOLIC PANEL
ALT: 8 U/L — AB (ref 14–54)
AST: 12 U/L — ABNORMAL LOW (ref 15–41)
Albumin: 1.7 g/dL — ABNORMAL LOW (ref 3.5–5.0)
Alkaline Phosphatase: 52 U/L (ref 38–126)
Anion gap: 8 (ref 5–15)
BUN: 13 mg/dL (ref 6–20)
CHLORIDE: 114 mmol/L — AB (ref 101–111)
CO2: 19 mmol/L — AB (ref 22–32)
CREATININE: 0.97 mg/dL (ref 0.44–1.00)
Calcium: 7 mg/dL — ABNORMAL LOW (ref 8.9–10.3)
GFR calc non Af Amer: 55 mL/min — ABNORMAL LOW (ref >60)
Glucose, Bld: 95 mg/dL (ref 65–99)
Potassium: 3.3 mmol/L — ABNORMAL LOW (ref 3.5–5.1)
SODIUM: 141 mmol/L (ref 135–145)
Total Bilirubin: 0.4 mg/dL (ref 0.3–1.2)
Total Protein: 4.6 g/dL — ABNORMAL LOW (ref 6.5–8.1)

## 2015-11-11 LAB — URINE CULTURE: Culture: NO GROWTH

## 2015-11-11 LAB — CBC
HCT: 20.9 % — ABNORMAL LOW (ref 36.0–46.0)
HEMATOCRIT: 26.2 % — AB (ref 36.0–46.0)
Hemoglobin: 6.4 g/dL — CL (ref 12.0–15.0)
Hemoglobin: 8.2 g/dL — ABNORMAL LOW (ref 12.0–15.0)
MCH: 28.1 pg (ref 26.0–34.0)
MCH: 28.2 pg (ref 26.0–34.0)
MCHC: 30.6 g/dL (ref 30.0–36.0)
MCHC: 31.3 g/dL (ref 30.0–36.0)
MCV: 91.7 fL (ref 78.0–100.0)
MCV: 90 fL (ref 78.0–100.0)
PLATELETS: 153 10*3/uL (ref 150–400)
PLATELETS: 198 10*3/uL (ref 150–400)
RBC: 2.28 MIL/uL — AB (ref 3.87–5.11)
RBC: 2.91 MIL/uL — ABNORMAL LOW (ref 3.87–5.11)
RDW: 16.1 % — ABNORMAL HIGH (ref 11.5–15.5)
RDW: 15.2 % (ref 11.5–15.5)
WBC: 7.1 10*3/uL (ref 4.0–10.5)
WBC: 8.6 10*3/uL (ref 4.0–10.5)

## 2015-11-11 LAB — PROCALCITONIN: Procalcitonin: 0.59 ng/mL

## 2015-11-11 LAB — HEMOGLOBIN A1C
Hgb A1c MFr Bld: 5.5 % (ref 4.8–5.6)
Mean Plasma Glucose: 111 mg/dL

## 2015-11-11 LAB — C DIFFICILE QUICK SCREEN W PCR REFLEX
C DIFFICILE (CDIFF) INTERP: NOT DETECTED
C DIFFICILE (CDIFF) TOXIN: NEGATIVE
C Diff antigen: NEGATIVE

## 2015-11-11 LAB — RPR: RPR Ser Ql: NONREACTIVE

## 2015-11-11 LAB — LACTIC ACID, PLASMA: Lactic Acid, Venous: 0.8 mmol/L (ref 0.5–1.9)

## 2015-11-11 LAB — TROPONIN I: Troponin I: <0.03 ng/mL (ref <0.03)

## 2015-11-11 LAB — PREPARE RBC (CROSSMATCH)

## 2015-11-11 LAB — CORTISOL: Cortisol, Plasma: 21.4 ug/dL

## 2015-11-11 LAB — T4, FREE: FREE T4: 2.4 ng/dL — AB (ref 0.61–1.12)

## 2015-11-11 MED ORDER — SODIUM CHLORIDE 0.9 % IV SOLN: Freq: Once | INTRAVENOUS | Status: AC

## 2015-11-11 MED ORDER — VANCOMYCIN HCL 500 MG IV SOLR
500.0000 mg | Freq: Two times a day (BID) | INTRAVENOUS | Status: DC
  Administered 2015-11-12 – 2015-11-13 (×3): 500 mg via INTRAVENOUS
  Filled 2015-11-11 (×6): qty 500

## 2015-11-11 MED ORDER — SODIUM CHLORIDE 0.9 % IV BOLUS (SEPSIS)
500.0000 mL | Freq: Once | INTRAVENOUS | Status: AC
  Administered 2015-11-11: 500 mL via INTRAVENOUS

## 2015-11-11 MED ORDER — METRONIDAZOLE IN NACL 5-0.79 MG/ML-% IV SOLN
500.0000 mg | Freq: Three times a day (TID) | INTRAVENOUS | Status: DC
  Administered 2015-11-11 – 2015-11-12 (×2): 500 mg via INTRAVENOUS
  Filled 2015-11-11 (×3): qty 100

## 2015-11-11 MED ORDER — ENOXAPARIN SODIUM 40 MG/0.4ML ~~LOC~~ SOLN
40.0000 mg | SUBCUTANEOUS | Status: DC
  Administered 2015-11-11 – 2015-11-20 (×10): 40 mg via SUBCUTANEOUS
  Filled 2015-11-11 (×10): qty 0.4

## 2015-11-11 MED ORDER — SODIUM CHLORIDE 0.9 % IV BOLUS (SEPSIS)
1000.0000 mL | Freq: Once | INTRAVENOUS | Status: AC
  Administered 2015-11-11: 1000 mL via INTRAVENOUS

## 2015-11-11 MED ORDER — VANCOMYCIN HCL IN DEXTROSE 1-5 GM/200ML-% IV SOLN
1000.0000 mg | INTRAVENOUS | Status: AC
  Administered 2015-11-11: 1000 mg via INTRAVENOUS
  Filled 2015-11-11: qty 200

## 2015-11-11 MED ORDER — POTASSIUM CHLORIDE CRYS ER 20 MEQ PO TBCR
40.0000 meq | EXTENDED_RELEASE_TABLET | Freq: Once | ORAL | Status: AC
  Administered 2015-11-11: 40 meq via ORAL
  Filled 2015-11-11: qty 2

## 2015-11-11 MED ORDER — ACETAMINOPHEN 325 MG PO TABS
650.0000 mg | ORAL_TABLET | Freq: Once | ORAL | Status: AC
  Administered 2015-11-11: 650 mg via ORAL
  Filled 2015-11-11: qty 2

## 2015-11-11 MED ORDER — FUROSEMIDE 10 MG/ML IJ SOLN
20.0000 mg | Freq: Once | INTRAMUSCULAR | Status: DC
  Administered 2015-11-11: 20 mg via INTRAVENOUS
  Filled 2015-11-11: qty 2

## 2015-11-11 MED ORDER — LACTATED RINGERS IV BOLUS (SEPSIS)
2000.0000 mL | Freq: Once | INTRAVENOUS | Status: AC
  Administered 2015-11-11: 2000 mL via INTRAVENOUS

## 2015-11-11 MED ORDER — DIPHENHYDRAMINE HCL 50 MG/ML IJ SOLN
25.0000 mg | Freq: Once | INTRAMUSCULAR | Status: AC
  Administered 2015-11-11: 25 mg via INTRAVENOUS
  Filled 2015-11-11: qty 1

## 2015-11-11 MED ORDER — LACTATED RINGERS IV SOLN
INTRAVENOUS | Status: DC
  Administered 2015-11-12 – 2015-11-14 (×3): via INTRAVENOUS

## 2015-11-11 MED ORDER — DEXTROSE 5 % IV SOLN
2.0000 g | INTRAVENOUS | Status: DC
  Administered 2015-11-11 – 2015-11-12 (×2): 2 g via INTRAVENOUS
  Filled 2015-11-11 (×4): qty 2

## 2015-11-11 MED ORDER — METHIMAZOLE 5 MG PO TABS
5.0000 mg | ORAL_TABLET | Freq: Two times a day (BID) | ORAL | Status: DC
  Filled 2015-11-11: qty 1

## 2015-11-11 NOTE — Progress Notes (Signed)
Pt spike a temp 103.0 gave her tylenol dropped down to 63.1, systolic blood pressure readings below 90, and diastolic below 50, on call physician paged about pt condition, ordered for fluid bolus all carried out but BP still reading low , HR reading high went above 190, on call physician schoor was made aware called me back on phone to keep her under observation, EKG done showing sinus tachy, per CCMD said the HR count is not right because is also counting the  T waves as HR,  I called rapid response about what has been going on with pt, I will continue to monitor pt

## 2015-11-11 NOTE — Consult Note (Signed)
PULMONARY / CRITICAL CARE MEDICINE   Name: Dawn [name] MRN: [id number] DOB: 1940-08-24    ADMISSION DATE:  11/10/2015 CONSULTATION DATE:  11/11/2015  REFERRING MD:  Oren Binet, M.D. / [facility]  CHIEF COMPLAINT:  Shock & Encephalopathy  HISTORY OF PRESENT ILLNESS:  75 y.o. female with only a prior known history of COPD and depression/anxiety. History obtained from the patient's electronic medical record as well as the patient however she is somewhat confused and with altered mentation. Patient was recently treated as an outpatient with Macrobid for a urinary tract infection. Per documentation she has had increasing lethargy with decreased oral intake for at least 2 weeks prior to admission. Patient also experiencing intermittent nausea/vomiting as well as diarrhea. Patient was noted to have altered mental status with confusion on presentation as well as underlying anemia. Patient was also reportedly mildly hypoxemic on room air but has been saturating normally despite aggressive fluid resuscitation with over 5 L of normal saline. Additionally, the patient has received 1 unit of packed red blood cells in transfusion for her anemia without obvious signs of bleeding. Patient was hypotensive on presentation and has continued to have hypotension despite aggressive fluid resuscitation. PCCM was consulted to assess the patient for possible ICU transfer given her tenuous status and ongoing hypotension.   PAST MEDICAL HISTORY :  Past Medical History:  Diagnosis Date  . COPD (chronic obstructive pulmonary disease) ([place])   . History of anxiety   . History of depression   . Spinal stenosis   . UTI (urinary tract infection)     PAST SURGICAL HISTORY: Past Surgical History:  Procedure Laterality Date  . NO PAST SURGERIES      No Known Allergies  No current facility-administered medications on file prior to encounter.    No current outpatient prescriptions on file prior to encounter.     FAMILY HISTORY:  Family History  Problem Relation Age of Onset  . Heart attack Mother   . Diabetes Father     SOCIAL HISTORY: Social History   Social History  . Marital status: Divorced    Spouse name: N/A  . Number of children: N/A  . Years of education: N/A   Social History Main Topics  . Smoking status: Former Research scientist (life sciences)  . Smokeless tobacco: Never Used  . Alcohol use No  . Drug use: Unknown  . Sexual activity: Not Asked   Other Topics Concern  . None   Social History Narrative  . None    REVIEW OF SYSTEMS:  Unable to obtain accurate review of systems given mild encephalopathy.  SUBJECTIVE: As above.  VITAL SIGNS: BP (!) 83/45   Pulse 71   Temp 97.4 F (36.3 C) (Oral)   Resp 20   Ht '5\' 8"'$  (1.727 m)   Wt 128 lb (58.1 kg)   SpO2 100%   BMI 19.46 kg/m   HEMODYNAMICS:    VENTILATOR SETTINGS:    INTAKE / OUTPUT: I/O last 3 completed shifts: In: 2407 [P.O.:620; I.V.:1620; IV Piggyback:167] Out: 500 [Urine:500]  PHYSICAL EXAMINATION: General:  Eyes closed. No acute distress. Nurse at bedside. Laying on her left side. Integument:  Warm & dry. No rash on exposed skin. Lymphatics:  No appreciated cervical or supraclavicular lymphadenoapthy. HEENT:  Dry mucus membranes. No oral ulcers. No scleral injection or icterus.  Cardiovascular:  Regular rhythm. No edema. No appreciable JVD given body positioning.  Pulmonary:  Good aeration & clear to auscultation bilaterally. Symmetric chest wall expansion. No accessory muscle use  on room air. Abdomen: Soft. Normal bowel sounds. Nondistended. Mildly tender to diffuse palpation. Stool loose to watery & brown in bedside commode. Musculoskeletal:  Normal bulk and tone.No joint deformity or effusion appreciated. Neurological:  CN 2-12 grossly in tact. No meningismus. Moving all 4 extremities equally. Following commands. Psychiatric:  Somewhat flat affect. Oriented to year, place, and person but not president.    LABS:  BMET  Recent Labs Lab 11/10/15 0220 11/11/15 0734  NA 136 141  K 3.6 3.3*  CL 102 114*  CO2 25 19*  BUN 17 13  CREATININE 1.56* 0.97  GLUCOSE 115* 95    Electrolytes  Recent Labs Lab 11/10/15 0220 11/11/15 0734  CALCIUM 8.1* 7.0*    CBC  Recent Labs Lab 11/10/15 0220 11/11/15 0734  WBC 9.8 7.1  HGB 8.5* 6.4*  HCT 28.2* 20.9*  PLT 351 153    Coag's No results for input(s): APTT, INR in the last 168 hours.  Sepsis Markers  Recent Labs Lab 11/10/15 0231 11/10/15 0903 11/11/15 0734  LATICACIDVEN 1.30  --  0.8  PROCALCITON  --  0.19  --     ABG  Recent Labs Lab 11/10/15 1100  PHART 7.444  PCO2ART 31.6*  PO2ART 105    Liver Enzymes  Recent Labs Lab 11/10/15 0220 11/11/15 0734  AST 12* 12*  ALT 9* 8*  ALKPHOS 68 52  BILITOT 0.6 0.4  ALBUMIN 2.5* 1.7*    Cardiac Enzymes  Recent Labs Lab 11/10/15 0220  TROPONINI <0.03    Glucose No results for input(s): GLUCAP in the last 168 hours.  Imaging No results found.   STUDIES:  CT HEAD W/O 10/31: No acute intracranial findings. There is moderate generalized atrophy and chronic appearing white matter hypodensities which likely represent small vessel ischemic disease.  MICROBIOLOGY: Blood Ctx x2 10/31 >> Urine Ctx 10/31 >> U/A 10/31:  Hyaline Casts / SG 1.018 / leukocytes small / Nitrite negative / WBC 0-5 MRSA PCR 11/1 >> C diff PCR 11/1 >>  ANTIBIOTICS: Rocephin 10/31 >> Vancomycin 10/31 >> Flagyl IV 11/1 >>  SIGNIFICANT EVENTS: 10/31 - Admit with altered mental status & hypotension 11/01 - Transfer to ICU with persistent hypotension despite 5+L IVF & 1u PRBC  LINES/TUBES: PIV x2  DISCUSSION:  75 y.o. female presenting with Shock and acute encephalopathy. Patient currently protecting her airway but shock despite aggressive fluid resuscitation is concerning for ongoing sepsis. Checking cardiac biomarkers and transthoracic echocardiogram. No evidence of adrenal  insufficiency at this time. Holding home antihypertensive regimen. Adding Flagyl to patient's antibiotic regimen given ongoing diarrhea and abdominal discomfort for possible C. difficile infection. Holding on CT imaging of the abdomen for now. Continuing aggressive IV fluid resuscitation. Transferring to intensive care unit given ongoing shock and potential for further clinical decompensation from her multisystem organ failure.  ASSESSMENT / PLAN:  CARDIOVASCULAR A:  Shock - Sepsis versus cardiogenic. Cortisol 21.4. Daughter previously reported normal SBPs in the 90s. Ectopic Atrial Tachycardia - Intermittent. H/O HTN  P:  Cardiology Consulted & following Vitals per unit protocol Continuous telemetry monitoring Continuing IVF resuscitation w/ 2L of LR over 2 hours Holding off on vasopressors for now Trending Troponin I q6hr x3 Checking Complete Echocardiogram Holding home Diovan  NEUROLOGIC A:   Acute Encephalopathy - Likely multifactorial from hypotension & toxic metabolic. RPR negative. H/O Anxiety/Depression Chronic Benzodiazepine Use - Home Xanax & UDS positive.  P:   Avoiding sedating medications Neuro Checks q4hr Fall Risk Precautions Monitor for symptoms  of Benzodiazepine withdrawal Holding home Wellbutrin XL, Oxycodone, Xanax, Trazodone & Neurontin.  INFECTIOUS A:   Possible Sepsis - Possible GI vs GU source. Diarrheal Illness vs UTI - UTI less likely but U/A bland after Macrobid.   P:   Continuing Empiric Vancomycin & Rocephin Day #2 Starting Flagyl IV q8hr for empiric C difficile treatment Awaiting Urine & Blood Culture Results Checking Stool C difficile PCR, Gastrointestinal PCR, & Norovirus PCR. Enteric Precautions placed Trending Procalcitonin per algorithm   RENAL A:   Hypokalemia - Mild. Replaced with PO KCl today. Metabolic Acidosis - Mild. Likely due to GI losses in conjunction w/ hyperchloremia. Hyperchloremia - Mild. Likely due to fluid  resuscitation.  P:   Monitoring UOP Trending electrolytes & renal function daily Replacing electrolytes as indicated  GASTROINTESTINAL A:   Diarrheal Illness Nausea w/ Emesis  P:   NPO except Ice Chips Zofran IV prn  See ID Section  HEMATOLOGIC A:   Anemia - Chronic Hgb around 10. Ferritin 151 & B12 1511. Some element of dilution. No signs of active bleeding. S/P 1u PRBC 11/1.  P:  Repeat Hgb @ 1800 hours Trending cell counts daily w/ CBC Transfuse for Hgb <7.0 or active bleeding SCDs Lovenox [place] daily  ENDOCRINE A:   Possible Hyperthyroidism - TSH 0.289 & Free T4 2.40.    P:   Holding on Tapazole for now Monitoring glucose on daily labs - A1c 5.5.  PULMONARY A: H/O COPD  P:   Continuous Pulse Oximetry Holding Breo   FAMILY  - Updates: No family at bedside 11/1. Attempted to contact daughter [name] via phone but no answer 11/1 left a message to call the unit.   - Inter-disciplinary family meet or Palliative Care meeting due by:  11/7  I have spent a total of 39 minutes of critical care time today caring for the patient and reviewing her electronic medical record.   Sonia Baller Ashok Cordia, M.D. [facility] Pulmonary & Critical Care Pager:  [phone] After 3pm or if no response, call [phone] 11/11/2015, 4:46 PM

## 2015-11-11 NOTE — Progress Notes (Signed)
Pt has been transfered to 4E room11by charge nurse Tremaine and the swot nurse, pt daughter has been informed about pt transfer, all pt belongings has been sent together with the pt

## 2015-11-11 NOTE — Progress Notes (Signed)
Patient's daughter asked to see CSW regarding Medicaid. She stated patient had Medicaid in [place], but they have been trying to get it converted to [place]. CSW provided a Medicaid application for follow up with DSS.  CSW signing off.  Percell Locus Edis Huish LCSWA [phone]

## 2015-11-11 NOTE — Progress Notes (Signed)
PROGRESS NOTE        PATIENT DETAILS Name: Dawn [name] Age: 75 y.o. Sex: female Date of Birth: [birth year] Admit Date: 11/10/2015 Admitting Physician Waldemar Dickens, MD [id number] D, MD  Brief Narrative: Patient is a 75 y.o. female with past medical history of anxiety and depression, recently treated in the outpatient setting for a UTI with Macrobid, admitted for evaluation of acute encephalopathy. Per family, patient has been lethargic, with no significant oral intake for at least 2 weeks prior to this admission. She is also had some intermittent nausea and vomiting. See below for further details  Subjective: Evaluated twice today-in the morning she was mostly sleepy but able to follow commands, and answered appropriately to a few of my questions. Subsequently evaluated this afternoon-she unfortunately had just received IV Benadryl in preparation for PRBC transfusion-and is again lethargic-but does awake and squeeze my hands on command, says yes and no but then goes back to sleep.Per RN-she was more awake and alert before PRBC transfusion. She developed intermittent episodes of hypotension overnight requiring IV fluid bolus challenges.  Per daughter, patient's blood pressure has been soft for the past 1 year-usually runs in the 90 systolic when she goes to her PCP. She previously was on antihypertensives-but since April 2016  is no longer taking them.  Assessment/Plan: Principal Problem: Acute encephalopathy: She is mostly lethargic rather than confused,I presume this is metabolic encephalopathy in a setting of UTI. Will continue empiric antibiotics, we will await culture data and follow clinical course. If no improvement, we can then consider further evaluation. Note, CT head on 10/31 negative for acute abnormalities-furthermore, she is moving all 4 extremities and appears nonfocal.  Active Problems: Sepsis: Probably secondary to UTI-no other foci of  infection apparent. Continue empiric vancomycin and Rocephin, await culture data. Blood pressure continues to be soft-however she does have chronic hypotension at baseline with systolic mostly in the 03E per family. She did receive numerous IV fluid boluses overnight. Random cortisol level this morning appears appropriate. Thankfully lactate levels are normal, and renal function has improved.  Will transfer to stepdown for close monitoring, if hypotension persists-may need PCCM evaluation.  SVT: Likely provoked by acute illness-appears to have hyperthyroidism as well. Unable to use beta blocker given soft blood pressure, likely will need to be started on Tapazole-LFTs appears to be okay. Await echo.  Acute kidney injury: Likely mild prerenal azotemia setting of sepsis/UTI/poor oral intake. Resolved with IV fluids.  Hyperthyroidism: Start Tapazole-not sure if this apathetic thyrotoxicosis-unable to use beta blocker given soft blood pressure.  Anemia: Appears to have chronic anemia at baseline-suspect acute drop in hemoglobin due to IV fluid dilution, acute illness. Transfuse 1 unit of PRBC and follow.  Anxiety and depression: Continue Xanax-given lethargy-continue to hold Wellbutrin.  History of chronic back pain: Continue to hold narcotics given hypotension and lethargy.  Chronic hypotension: Per family (daughter at bedside this morning) patient's blood pressure is mostly in the 09Q systolic range-random cortisol levels appear appropriate- transfer to stepdown for close monitoring   DVT Prophylaxis: Prophylactic Lovenox  Code Status: Full code   Family Communication: Daughter at bedside  Disposition Plan: Remain inpatient-will require several more days of hospitalization prior to consideration of discharge  Antimicrobial agents: See below  Procedures: None  CONSULTS:  None  Time spent: 25 minutes-Greater than 50% of this time was  spent in counseling, explanation of diagnosis,  planning of further management, and coordination of care.  MEDICATIONS: Anti-infectives    Start     Dose/Rate Route Frequency Ordered Stop   11/11/15 2200  vancomycin (VANCOCIN) 500 mg in sodium chloride 0.9 % 100 mL IVPB     500 mg 100 mL/hr over 60 Minutes Intravenous Every 12 hours 11/11/15 0905     11/11/15 1800  cefTRIAXone (ROCEPHIN) 1 g in dextrose 5 % 50 mL IVPB  Status:  Discontinued     1 g 100 mL/hr over 30 Minutes Intravenous Every 24 hours 11/10/15 1918 11/11/15 0832   11/11/15 1800  cefTRIAXone (ROCEPHIN) 2 g in dextrose 5 % 50 mL IVPB     2 g 100 mL/hr over 30 Minutes Intravenous Every 24 hours 11/11/15 0832     11/11/15 0930  vancomycin (VANCOCIN) IVPB 1000 mg/200 mL premix     1,000 mg 200 mL/hr over 60 Minutes Intravenous NOW 11/11/15 0857 11/12/15 0930   11/10/15 1730  cefTRIAXone (ROCEPHIN) 2 g in dextrose 5 % 50 mL IVPB     2 g 100 mL/hr over 30 Minutes Intravenous  Once 11/10/15 1718 11/10/15 1853      Scheduled Meds: . cefTRIAXone (ROCEPHIN)  IV  2 g Intravenous Q24H  . enoxaparin (LOVENOX) injection  40 mg Subcutaneous Q24H  . feeding supplement (ENSURE ENLIVE)  237 mL Oral BID BM  . furosemide  20 mg Intravenous Once  . traZODone  50 mg Oral QHS  . vancomycin  500 mg Intravenous Q12H  . vancomycin  1,000 mg Intravenous NOW   Continuous Infusions: . sodium chloride 100 mL/hr at 11/11/15 0853   PRN Meds:.acetaminophen **OR** acetaminophen, ALPRAZolam, ondansetron **OR** ondansetron (ZOFRAN) IV   PHYSICAL EXAM: Vital signs: Vitals:   11/11/15 1306 11/11/15 1324 11/11/15 1326 11/11/15 1539  BP: (!) 85/44 (!) 79/44 (!) 79/44 (!) 84/50  Pulse: 100 85 88 72  Resp: '20 20  20  '$ Temp: 98.2 F (36.8 C) 98.5 F (36.9 C)  97.4 F (36.3 C)  TempSrc: Oral Oral  Oral  SpO2: 98% 98% 99% 97%  Weight:      Height:       Filed Weights   11/10/15 0148  Weight: 58.1 kg (128 lb)   Body mass index is 19.46 kg/m.   General appearance :Appears mostly  lethargic-speech is slow, she does follow commands after repeated attempts. She moves all extremities and is able to squeeze my fingers with both her hands.   Eyes:, pupils equally reactive to light and accomodation,no scleral icterus.Pink conjunctiva HEENT: Atraumatic and Normocephalic Neck: supple, no JVD. No cervical lymphadenopathy. No thyromegaly Resp:Good air entry bilaterally, no added sounds  CVS: S1 S2 regular, no murmurs.  GI: Bowel sounds present, Non tender and not distended with no gaurding, rigidity or rebound.No organomegaly Extremities: B/L Lower Ext shows no edema, both legs are warm to touch Neurology: Non focal, sensation is grossly intact. Psychiatric: Normal judgment and insight. Alert and oriented x 3. Normal mood. Musculoskeletal:gait appears to be normal.No digital cyanosis Skin:No Rash, warm and dry Wounds:N/A  I have personally reviewed following labs and imaging studies  LABORATORY DATA: CBC:  Recent Labs Lab 11/10/15 0220 11/11/15 0734  WBC 9.8 7.1  NEUTROABS 7.5  --   HGB 8.5* 6.4*  HCT 28.2* 20.9*  MCV 92.8 91.7  PLT 351 696    Basic Metabolic Panel:  Recent Labs Lab 11/10/15 0220 11/11/15 0734  NA 136 141  K 3.6 3.3*  CL 102 114*  CO2 25 19*  GLUCOSE 115* 95  BUN 17 13  CREATININE 1.56* 0.97  CALCIUM 8.1* 7.0*    GFR: Estimated Creatinine Clearance: 45.3 mL/min (by C-G formula based on SCr of 0.97 mg/dL).  Liver Function Tests:  Recent Labs Lab 11/10/15 0220 11/11/15 0734  AST 12* 12*  ALT 9* 8*  ALKPHOS 68 52  BILITOT 0.6 0.4  PROT 6.4* 4.6*  ALBUMIN 2.5* 1.7*   No results for input(s): LIPASE, AMYLASE in the last 168 hours. No results for input(s): AMMONIA in the last 168 hours.  Coagulation Profile: No results for input(s): INR, PROTIME in the last 168 hours.  Cardiac Enzymes:  Recent Labs Lab 11/10/15 0220  TROPONINI <0.03    BNP (last 3 results) No results for input(s): PROBNP in the last 8760  hours.  HbA1C: No results for input(s): HGBA1C in the last 72 hours.  CBG: No results for input(s): GLUCAP in the last 168 hours.  Lipid Profile: No results for input(s): CHOL, HDL, LDLCALC, TRIG, CHOLHDL, LDLDIRECT in the last 72 hours.  Thyroid Function Tests:  Recent Labs  11/10/15 0903 11/11/15 0049  TSH 0.289*  --   FREET4  --  2.40*    Anemia Panel:  Recent Labs  11/10/15 0903  VITAMINB12 1,511*  FOLATE 21.9  FERRITIN 151  TIBC 153*  IRON 9*  RETICCTPCT 2.8    Urine analysis:    Component Value Date/Time   COLORURINE AMBER (A) 11/10/2015 0230   APPEARANCEUR CLEAR 11/10/2015 0230   LABSPEC 1.018 11/10/2015 0230   PHURINE 6.0 11/10/2015 0230   GLUCOSEU NEGATIVE 11/10/2015 0230   HGBUR NEGATIVE 11/10/2015 0230   BILIRUBINUR SMALL (A) 11/10/2015 0230   KETONESUR 15 (A) 11/10/2015 0230   PROTEINUR 30 (A) 11/10/2015 0230   UROBILINOGEN 0.2 04/29/2008 2235   NITRITE NEGATIVE 11/10/2015 0230   LEUKOCYTESUR SMALL (A) 11/10/2015 0230    Sepsis Labs: Lactic Acid, Venous    Component Value Date/Time   LATICACIDVEN 0.8 11/11/2015 0734    MICROBIOLOGY: Recent Results (from the past 240 hour(s))  Urine culture     Status: None   Collection Time: 11/10/15  2:30 AM  Result Value Ref Range Status   Specimen Description   Final    URINE, CLEAN CATCH Performed at [facility] Requests NONE  Final   Culture NO GROWTH  Final   Report Status 11/11/2015 FINAL  Final  Culture, blood (Routine X 2) w Reflex to ID Panel     Status: None (Preliminary result)   Collection Time: 11/10/15  9:15 AM  Result Value Ref Range Status   Specimen Description BLOOD LEFT ARM  Final   Special Requests IN PEDIATRIC BOTTLE 2CC  Final   Culture NO GROWTH 1 DAY  Final   Report Status PENDING  Incomplete  Culture, blood (Routine X 2) w Reflex to ID Panel     Status: None (Preliminary result)   Collection Time: 11/10/15  9:20 AM  Result Value Ref Range Status    Specimen Description BLOOD LEFT HAND  Final   Special Requests IN PEDIATRIC BOTTLE 2CC  Final   Culture NO GROWTH 1 DAY  Final   Report Status PENDING  Incomplete    RADIOLOGY STUDIES/RESULTS: Dg Chest 2 View  Result Date: 11/10/2015 CLINICAL DATA:  Hypoxemia EXAM: CHEST  2 VIEW COMPARISON:  03/19/2009 FINDINGS: Cardiac shadow remains enlarged. Aortic calcifications are noted. The lungs  demonstrate scattered interstitial changes without focal infiltrate. Some scattered scarring is noted in the left lung base stable from the prior exam. No acute infiltrate or sizable effusion is noted. Old rib fractures on the right and old right clavicular fracture are noted. IMPRESSION: Chronic changes without acute abnormality. Electronically Signed   By: Inez Catalina M.D.   On: 11/10/2015 14:31   Ct Head Wo Contrast  Result Date: 11/10/2015 CLINICAL DATA:  Increasing confusion EXAM: CT HEAD WITHOUT CONTRAST TECHNIQUE: Contiguous axial images were obtained from the base of the skull through the vertex without intravenous contrast. COMPARISON:  None. FINDINGS: Brain: There is no intracranial hemorrhage, mass or evidence of acute infarction. There is mild generalized atrophy. There is mild chronic microvascular ischemic change. There is no significant extra-axial fluid collection. No acute intracranial findings are evident. The calvarium and skullbase are intact. Visible paranasal sinuses and orbits are unremarkable. Vascular: No hyperdense vessel or unexpected calcification. Skull: Normal. Negative for fracture or focal lesion. Sinuses/Orbits: Left maxillary sinus air-fluid level IMPRESSION: No acute intracranial findings. There is moderate generalized atrophy and chronic appearing white matter hypodensities which likely represent small vessel ischemic disease. Electronically Signed   By: Andreas Newport M.D.   On: 11/10/2015 04:05     LOS: 1 day   Oren Binet, MD  [facility] Pager:[phone]  [phone]  If 7PM-7AM, please contact night-coverage [email or web address] Password [email or web address] 11/11/2015, 3:45 PM

## 2015-11-11 NOTE — Progress Notes (Signed)
Pharmacy Antibiotic Note  Dawn [name] is a 75 y.o. female admitted on 11/10/2015 with AMS.  Pharmacy has been consulted for vancomycin dosing for sepsis; ceftriaxone started last evening for UTI, changed to 2 g IV q24h to r/o bacteremia. Of note, recently treated for UTI, possibly inadequately treated. Prescribed Macrobid 100 mg PO QID x 7 days. Confirmed Rx with [facility]. Filled on 10/23, completed 10/29.  Long-acting formulation usually dosed BID. AKI improved, SCr down to normal.  Plan: Vancomycin 1000 mg IV now then 500 mg IV q12h Monitor renal function and clinical progress Trough as clinically indicated Monitor microdata Follow-up decrease in ceftriaxone if neg for bactermia  Height: '5\' 8"'$  (172.7 cm) Weight: 128 lb (58.1 kg) IBW/kg (Calculated) : 63.9  Temp (24hrs), Avg:99.7 F (37.6 C), Min:98.6 F (37 C), Max:103 F (39.4 C)   Recent Labs Lab 11/10/15 0220 11/10/15 0231 11/11/15 0734  WBC 9.8  --  7.1  CREATININE 1.56*  --  0.97  LATICACIDVEN  --  1.30 0.8    Estimated Creatinine Clearance: 45.3 mL/min (by C-G formula based on SCr of 0.97 mg/dL).    No Known Allergies  Antimicrobials this admission: Ceftriaxone 10/31 >> Vancomycin 11/1 >>  Dose adjustments this admission:   Microbiology results: 10/31 BCx: sent 10/31 UCx: sent    Thank you for allowing pharmacy to be a part of this patient's care.  Renold Genta, PharmD, BCPS Clinical Pharmacist Phone for today - [place] - [phone] 11/11/2015 8:46 AM

## 2015-11-11 NOTE — Progress Notes (Signed)
Alerted for RRT radar list of patient with soft BP during the night.  On arrival patient supine in bed - skin warm and dry - pale - arouses easily to name - answers questions appropriately although sometimes delayed - states she does not feel good and she is cold - bil BS = clear - abd soft - denies pain - NS bolus infusing at 999cc/hr - IV site good - manual BP done 92/38 HR 104 RR 24 O2 sats 97% on 2 liter nasal cannula - NAD noted - weak- daughter present in room - states her mother has been getting weaker over last 2 weeks - not much eating and drinking during that time.  Review of chart last 24 hours shows BP seems to be fluid dependent.  Daughter also reports mom use to be hypertensive but has recently been taken off BP pills and her normal BP recently has been in the 90's.  Lactic acid being drawn.  Will follow as needed - RN to call for assistance.

## 2015-11-11 NOTE — Consult Note (Signed)
Primary cardiologist: new  HPI: Exercise is a 75 year old female with past medical history of COPD admitted with altered mental status, possible UTI, anemia for evaluation of supraventricular tachycardia. There is no prior cardiac history. She typically does not have dyspnea, chest pain or syncope. She has noticed occasional palpitations recently. No syncope. Patient admitted with a one-week history of urinary tract infection, altered mental status, decreased oral intake, nausea and vomiting and generalized weakness. She has been found to have severe anemia and hyperthyroidism. She is noted to have intermittent atrial tachycardia on telemetry. Cardiology now asked to evaluate.   Medications Prior to Admission  Medication Sig Dispense Refill  . ALPRAZolam (XANAX) 1 MG tablet Take 1 mg by mouth 3 (three) times daily as needed for anxiety.    Marland Kitchen buPROPion (WELLBUTRIN XL) 150 MG 24 hr tablet Take 150 mg by mouth daily.    Marland Kitchen FERROCITE 324 MG TABS tablet Take 324 mg by mouth daily.  2  . gabapentin (NEURONTIN) 600 MG tablet Take 600 mg by mouth 3 (three) times daily.  3  . INCRUSE ELLIPTA 62.5 MCG/INH AEPB Inhale 1 puff into the lungs daily.  3  . Oxycodone HCl 10 MG TABS Take 10 mg by mouth every 8 (eight) hours.  0  . traZODone (DESYREL) 100 MG tablet Take 100 mg by mouth at bedtime.    . valsartan (DIOVAN) 160 MG tablet Take 160 mg by mouth daily.  4    No Known Allergies  Past Medical History:  Diagnosis Date  . COPD (chronic obstructive pulmonary disease) ([place])   . Spinal stenosis   . UTI (urinary tract infection)     Past Surgical History:  Procedure Laterality Date  . NO PAST SURGERIES      Social History   Social History  . Marital status: Divorced    Spouse name: N/A  . Number of children: N/A  . Years of education: N/A   Occupational History  . Not on file.   Social History Main Topics  . Smoking status: Former Research scientist (life sciences)  . Smokeless tobacco: Never Used  . Alcohol  use No  . Drug use: Unknown  . Sexual activity: Not on file   Other Topics Concern  . Not on file   Social History Narrative  . No narrative on file    Family History  Problem Relation Age of Onset  . Heart attack Mother   . Diabetes Father     ROS:  Generalized weakness, nausea and vomiting, dysuria but no fevers or chills, productive cough, hemoptysis, dysphasia, odynophagia, melena, hematochezia, rash, seizure activity, orthopnea, PND, pedal edema. Remaining systems are negative.  Physical Exam:   Blood pressure 102/67, pulse 78, temperature 98.3 F (36.8 C), temperature source Oral, resp. rate (!) 24, height '5\' 8"'  (1.727 m), weight 128 lb (58.1 kg), SpO2 98 %.  General:  Well developed/well frail in NAD Skin warm/dry; pale Patient not depressed No peripheral clubbing Back-not assessed HEENT-normal/normal eyelids Neck supple/normal carotid upstroke bilaterally; no bruits; no JVD; no thyromegaly chest - CTA/ normal expansion CV - RRR/normal S1 and S2; no murmurs, rubs or gallops;  PMI nondisplaced Abdomen -NT/ND, no HSM, no mass, + bowel sounds, no bruit 2+ femoral pulses, no bruits Ext-no edema, chords; diminished distal pulses Neuro-grossly nonfocal  ECG  sinus rhythm with brief PAT.  Results for orders placed or performed during the hospital encounter of 11/10/15 (from the past 48 hour(s))  CBC with Differential  Status: Abnormal   Collection Time: 11/10/15  2:20 AM  Result Value Ref Range   WBC 9.8 4.0 - 10.5 K/uL   RBC 3.04 (L) 3.87 - 5.11 MIL/uL   Hemoglobin 8.5 (L) 12.0 - 15.0 g/dL   HCT 28.2 (L) 36.0 - 46.0 %   MCV 92.8 78.0 - 100.0 fL   MCH 28.0 26.0 - 34.0 pg   MCHC 30.1 30.0 - 36.0 g/dL   RDW 15.5 11.5 - 15.5 %   Platelets 351 150 - 400 K/uL   Neutrophils Relative % 77 %   Neutro Abs 7.5 1.7 - 7.7 K/uL   Lymphocytes Relative 11 %   Lymphs Abs 1.1 0.7 - 4.0 K/uL   Monocytes Relative 12 %   Monocytes Absolute 1.2 (H) 0.1 - 1.0 K/uL    Eosinophils Relative 0 %   Eosinophils Absolute 0.0 0.0 - 0.7 K/uL   Basophils Relative 0 %   Basophils Absolute 0.0 0.0 - 0.1 K/uL  Comprehensive metabolic panel     Status: Abnormal   Collection Time: 11/10/15  2:20 AM  Result Value Ref Range   Sodium 136 135 - 145 mmol/L   Potassium 3.6 3.5 - 5.1 mmol/L   Chloride 102 101 - 111 mmol/L   CO2 25 22 - 32 mmol/L   Glucose, Bld 115 (H) 65 - 99 mg/dL   BUN 17 6 - 20 mg/dL   Creatinine, Ser 1.56 (H) 0.44 - 1.00 mg/dL   Calcium 8.1 (L) 8.9 - 10.3 mg/dL   Total Protein 6.4 (L) 6.5 - 8.1 g/dL   Albumin 2.5 (L) 3.5 - 5.0 g/dL   AST 12 (L) 15 - 41 U/L   ALT 9 (L) 14 - 54 U/L   Alkaline Phosphatase 68 38 - 126 U/L   Total Bilirubin 0.6 0.3 - 1.2 mg/dL   GFR calc non Af Amer 31 (L) >60 mL/min   GFR calc Af Amer 36 (L) >60 mL/min    Comment: (NOTE) The eGFR has been calculated using the CKD EPI equation. This calculation has not been validated in all clinical situations. eGFR's persistently <60 mL/min signify possible Chronic Kidney Disease.    Anion gap 9 5 - 15  Troponin I     Status: None   Collection Time: 11/10/15  2:20 AM  Result Value Ref Range   Troponin I <0.03 <0.03 ng/mL  Urinalysis, Routine w reflex microscopic (not at [facility])     Status: Abnormal   Collection Time: 11/10/15  2:30 AM  Result Value Ref Range   Color, Urine AMBER (A) YELLOW    Comment: BIOCHEMICALS MAY BE AFFECTED BY COLOR   APPearance CLEAR CLEAR   Specific Gravity, Urine 1.018 1.005 - 1.030   pH 6.0 5.0 - 8.0   Glucose, UA NEGATIVE NEGATIVE mg/dL   Hgb urine dipstick NEGATIVE NEGATIVE   Bilirubin Urine SMALL (A) NEGATIVE   Ketones, ur 15 (A) NEGATIVE mg/dL   Protein, ur 30 (A) NEGATIVE mg/dL   Nitrite NEGATIVE NEGATIVE   Leukocytes, UA SMALL (A) NEGATIVE  Urine microscopic-add on     Status: Abnormal   Collection Time: 11/10/15  2:30 AM  Result Value Ref Range   Squamous Epithelial / LPF 0-5 (A) NONE SEEN   WBC, UA 0-5 0 - 5 WBC/hpf   RBC / HPF 0-5  0 - 5 RBC/hpf   Bacteria, UA FEW (A) NONE SEEN   Casts HYALINE CASTS (A) NEGATIVE    Comment: GRANULAR CAST  Rapid urine  drug screen (hospital performed)     Status: Abnormal   Collection Time: 11/10/15  2:30 AM  Result Value Ref Range   Opiates NONE DETECTED NONE DETECTED   Cocaine NONE DETECTED NONE DETECTED   Benzodiazepines POSITIVE (A) NONE DETECTED   Amphetamines NONE DETECTED NONE DETECTED   Tetrahydrocannabinol NONE DETECTED NONE DETECTED   Barbiturates NONE DETECTED NONE DETECTED    Comment:        DRUG SCREEN FOR MEDICAL PURPOSES ONLY.  IF CONFIRMATION IS NEEDED FOR ANY PURPOSE, NOTIFY LAB WITHIN 5 DAYS.        LOWEST DETECTABLE LIMITS FOR URINE DRUG SCREEN Drug Class       Cutoff (ng/mL) Amphetamine      1000 Barbiturate      200 Benzodiazepine   219 Tricyclics       758 Opiates          300 Cocaine          300 THC              50   Urine culture     Status: None   Collection Time: 11/10/15  2:30 AM  Result Value Ref Range   Specimen Description      URINE, CLEAN CATCH Performed at [place] Requests NONE    Culture NO GROWTH    Report Status 11/11/2015 FINAL   I-Stat CG4 Lactic Acid, ED     Status: None   Collection Time: 11/10/15  2:31 AM  Result Value Ref Range   Lactic Acid, Venous 1.30 0.5 - 1.9 mmol/L  Occult blood card to lab, stool Provider will collect     Status: None   Collection Time: 11/10/15  3:05 AM  Result Value Ref Range   Fecal Occult Bld NEGATIVE NEGATIVE  Procalcitonin - Baseline     Status: None   Collection Time: 11/10/15  9:03 AM  Result Value Ref Range   Procalcitonin 0.19 ng/mL    Comment:        Interpretation: PCT (Procalcitonin) <= 0.5 ng/mL: Systemic infection (sepsis) is not likely. Local bacterial infection is possible. (NOTE)         ICU PCT Algorithm               Non ICU PCT Algorithm    ----------------------------     ------------------------------         PCT < 0.25 ng/mL                  PCT < 0.1 ng/mL     Stopping of antibiotics            Stopping of antibiotics       strongly encouraged.               strongly encouraged.    ----------------------------     ------------------------------       PCT level decrease by               PCT < 0.25 ng/mL       >= 80% from peak PCT       OR PCT 0.25 - 0.5 ng/mL          Stopping of antibiotics  encouraged.     Stopping of antibiotics           encouraged.    ----------------------------     ------------------------------       PCT level decrease by              PCT >= 0.25 ng/mL       < 80% from peak PCT        AND PCT >= 0.5 ng/mL            Continuin g antibiotics                                              encouraged.       Continuing antibiotics            encouraged.    ----------------------------     ------------------------------     PCT level increase compared          PCT > 0.5 ng/mL         with peak PCT AND          PCT >= 0.5 ng/mL             Escalation of antibiotics                                          strongly encouraged.      Escalation of antibiotics        strongly encouraged.   TSH     Status: Abnormal   Collection Time: 11/10/15  9:03 AM  Result Value Ref Range   TSH 0.289 (L) 0.350 - 4.500 uIU/mL    Comment: Performed by a 3rd Generation assay with a functional sensitivity of <=0.01 uIU/mL.  Vitamin B12     Status: Abnormal   Collection Time: 11/10/15  9:03 AM  Result Value Ref Range   Vitamin B-12 1,511 (H) 180 - 914 pg/mL    Comment: (NOTE) This assay is not validated for testing neonatal or myeloproliferative syndrome specimens for Vitamin B12 levels.   Folate     Status: None   Collection Time: 11/10/15  9:03 AM  Result Value Ref Range   Folate 21.9 >5.9 ng/mL  Iron and TIBC     Status: Abnormal   Collection Time: 11/10/15  9:03 AM  Result Value Ref Range   Iron 9 (L) 28 - 170 ug/dL   TIBC 153 (L) 250 - 450 ug/dL   Saturation Ratios  6 (L) 10.4 - 31.8 %   UIBC 144 ug/dL  Ferritin     Status: None   Collection Time: 11/10/15  9:03 AM  Result Value Ref Range   Ferritin 151 11 - 307 ng/mL  Reticulocytes     Status: Abnormal   Collection Time: 11/10/15  9:03 AM  Result Value Ref Range   Retic Ct Pct 2.8 0.4 - 3.1 %   RBC. 3.03 (L) 3.87 - 5.11 MIL/uL   Retic Count, Manual 84.8 19.0 - 186.0 K/uL  RPR     Status: None   Collection Time: 11/10/15  9:03 AM  Result Value Ref Range   RPR Ser Ql Non Reactive Non Reactive    Comment: (NOTE) Performed At: [facility] [address] [place], [place] [id number] Evette Doffing  Darrick Penna MD [id number]   Blood gas, arterial     Status: Abnormal   Collection Time: 11/10/15 11:00 AM  Result Value Ref Range   O2 Content 2.0 L/min   Delivery systems NASAL CANNULA    pH, Arterial 7.444 7.350 - 7.450   pCO2 arterial 31.6 (L) 32.0 - 48.0 mmHg   pO2, Arterial 105 83.0 - 108.0 mmHg   Bicarbonate 21.3 20.0 - 28.0 mmol/L   Acid-base deficit 2.1 (H) 0.0 - 2.0 mmol/L   O2 Saturation 96.8 %   Patient temperature 98.6    Collection site RIGHT RADIAL    Drawn by [id number]    Sample type ARTERIAL DRAW    Allens test (pass/fail) PASS PASS  T4, free     Status: Abnormal   Collection Time: 11/11/15 12:49 AM  Result Value Ref Range   Free T4 2.40 (H) 0.61 - 1.12 ng/dL    Comment: (NOTE) Biotin ingestion may interfere with free T4 tests. If the results are inconsistent with the TSH level, previous test results, or the clinical presentation, then consider biotin interference. If needed, order repeat testing after stopping biotin.   CBC     Status: Abnormal   Collection Time: 11/11/15  7:34 AM  Result Value Ref Range   WBC 7.1 4.0 - 10.5 K/uL   RBC 2.28 (L) 3.87 - 5.11 MIL/uL   Hemoglobin 6.4 (LL) 12.0 - 15.0 g/dL    Comment: REPEATED TO VERIFY CRITICAL RESULT CALLED TO, READ BACK BY AND VERIFIED WITH: Baruch Merl RN [phone] 11/11/2015 BY MACEDA, J    HCT 20.9 (L) 36.0 - 46.0  %   MCV 91.7 78.0 - 100.0 fL   MCH 28.1 26.0 - 34.0 pg   MCHC 30.6 30.0 - 36.0 g/dL   RDW 16.1 (H) 11.5 - 15.5 %   Platelets 153 150 - 400 K/uL  Comprehensive metabolic panel     Status: Abnormal   Collection Time: 11/11/15  7:34 AM  Result Value Ref Range   Sodium 141 135 - 145 mmol/L   Potassium 3.3 (L) 3.5 - 5.1 mmol/L   Chloride 114 (H) 101 - 111 mmol/L   CO2 19 (L) 22 - 32 mmol/L   Glucose, Bld 95 65 - 99 mg/dL   BUN 13 6 - 20 mg/dL   Creatinine, Ser 0.97 0.44 - 1.00 mg/dL   Calcium 7.0 (L) 8.9 - 10.3 mg/dL   Total Protein 4.6 (L) 6.5 - 8.1 g/dL   Albumin 1.7 (L) 3.5 - 5.0 g/dL   AST 12 (L) 15 - 41 U/L   ALT 8 (L) 14 - 54 U/L   Alkaline Phosphatase 52 38 - 126 U/L   Total Bilirubin 0.4 0.3 - 1.2 mg/dL   GFR calc non Af Amer 55 (L) >60 mL/min   GFR calc Af Amer >60 >60 mL/min    Comment: (NOTE) The eGFR has been calculated using the CKD EPI equation. This calculation has not been validated in all clinical situations. eGFR's persistently <60 mL/min signify possible Chronic Kidney Disease.    Anion gap 8 5 - 15  Lactic acid, plasma     Status: None   Collection Time: 11/11/15  7:34 AM  Result Value Ref Range   Lactic Acid, Venous 0.8 0.5 - 1.9 mmol/L  Cortisol     Status: None   Collection Time: 11/11/15  8:53 AM  Result Value Ref Range   Cortisol, Plasma 21.4 ug/dL    Comment: (NOTE) AM  6.7 - 22.6 ug/dL PM   <10.0       ug/dL   Type and screen     Status: None (Preliminary result)   Collection Time: 11/11/15 10:00 AM  Result Value Ref Range   ABO/RH(D) O POS    Antibody Screen NEG    Sample Expiration 11/14/2015    Unit Number [id number]    Blood Component Type RED CELLS,LR    Unit division 00    Status of Unit ALLOCATED    Transfusion Status OK TO TRANSFUSE    Crossmatch Result Compatible   Prepare RBC     Status: None   Collection Time: 11/11/15 10:00 AM  Result Value Ref Range   Order Confirmation ORDER PROCESSED BY BLOOD BANK     Dg Chest 2  View  Result Date: 11/10/2015 CLINICAL DATA:  Hypoxemia EXAM: CHEST  2 VIEW COMPARISON:  03/19/2009 FINDINGS: Cardiac shadow remains enlarged. Aortic calcifications are noted. The lungs demonstrate scattered interstitial changes without focal infiltrate. Some scattered scarring is noted in the left lung base stable from the prior exam. No acute infiltrate or sizable effusion is noted. Old rib fractures on the right and old right clavicular fracture are noted. IMPRESSION: Chronic changes without acute abnormality. Electronically Signed   By: Inez Catalina M.D.   On: 11/10/2015 14:31   Ct Head Wo Contrast  Result Date: 11/10/2015 CLINICAL DATA:  Increasing confusion EXAM: CT HEAD WITHOUT CONTRAST TECHNIQUE: Contiguous axial images were obtained from the base of the skull through the vertex without intravenous contrast. COMPARISON:  None. FINDINGS: Brain: There is no intracranial hemorrhage, mass or evidence of acute infarction. There is mild generalized atrophy. There is mild chronic microvascular ischemic change. There is no significant extra-axial fluid collection. No acute intracranial findings are evident. The calvarium and skullbase are intact. Visible paranasal sinuses and orbits are unremarkable. Vascular: No hyperdense vessel or unexpected calcification. Skull: Normal. Negative for fracture or focal lesion. Sinuses/Orbits: Left maxillary sinus air-fluid level IMPRESSION: No acute intracranial findings. There is moderate generalized atrophy and chronic appearing white matter hypodensities which likely represent small vessel ischemic disease. Electronically Signed   By: Andreas Newport M.D.   On: 11/10/2015 04:05    Assessment/Plan 1 Supraventricular tachycardia-telemetry has been reviewed. Patient appears to be having an ectopic atrial tachycardia intermittently. However she is asymptomatic. We will add a beta blocker later but at present cannot because of hypotension. Check echocardiogram.  2  sinus tachycardia-this is likely being driven by severe anemia, dehydration and hyperthyroidism. I would expect this to improve as this is treated.  3 severe normocytic anemia-hemoglobin this morning is 6.4. Transfusion and further evaluation per primary care. No evidence of GI bleeding.   4 Hyperthyroidism-management per primary care.  5 Urinary tract infection/dehydration-agree with IV fluids. Further treatment per primary service.  Kirk Ruths MD 11/11/2015, 12:37 PM

## 2015-11-11 NOTE — Progress Notes (Signed)
CRITICAL VALUE ALERT  Critical value received:  HGB 6.4  Date of notification: 11/11/15  Time of notification: 0850  Critical value read back:  Yes  Nurse who received alert:  [facility]  MD notified (1st page):  Ghimire  Time of first page:  [phone]  MD notified (2nd page):  Time of second page:  Responding MD:   Time MD responded:

## 2015-11-12 ENCOUNTER — Inpatient Hospital Stay (HOSPITAL_COMMUNITY): Payer: Medicare Other

## 2015-11-12 DIAGNOSIS — R9431 Abnormal electrocardiogram [ECG] [EKG]: Secondary | ICD-10-CM

## 2015-11-12 DIAGNOSIS — I959 Hypotension, unspecified: Secondary | ICD-10-CM

## 2015-11-12 DIAGNOSIS — E0590 Thyrotoxicosis, unspecified without thyrotoxic crisis or storm: Secondary | ICD-10-CM

## 2015-11-12 DIAGNOSIS — A419 Sepsis, unspecified organism: Secondary | ICD-10-CM

## 2015-11-12 LAB — ECHOCARDIOGRAM COMPLETE
AVLVOTPG: 2 mmHg
CHL CUP DOP CALC LVOT VTI: 13.6 cm
CHL CUP MV DEC (S): 183
CHL CUP TV REG PEAK VELOCITY: 289 cm/s
E/e' ratio: 14
EWDT: 183 ms
FS: 20 % — AB (ref 28–44)
HEIGHTINCHES: 68 in
IV/PV OW: 1.11
LA diam end sys: 29 mm
LA diam index: 1.72 cm/m2
LA vol A4C: 17 ml
LA vol: 38 mL
LASIZE: 29 mm
LAVOLIN: 22.5 mL/m2
LV E/e' medial: 14
LV PW d: 11.4 mm — AB (ref 0.6–1.1)
LV e' LATERAL: 5.15 cm/s
LVEEAVG: 14
LVOT SV: 52 mL
LVOT area: 3.8 cm2
LVOT peak vel: 72.9 cm/s
LVOTD: 22 mm
MV Peak grad: 2 mmHg
MV pk E vel: 72.1 m/s
MVPKAVEL: 76.5 m/s
P 1/2 time: 413 ms
TDI e' lateral: 5.15
TDI e' medial: 6.14
TRMAXVEL: 289 cm/s
WEIGHTICAEL: 2048 [oz_av]

## 2015-11-12 LAB — CBC WITH DIFFERENTIAL/PLATELET
BASOS ABS: 0 10*3/uL (ref 0.0–0.1)
Basophils Relative: 0 %
Eosinophils Absolute: 0 10*3/uL (ref 0.0–0.7)
Eosinophils Relative: 0 %
HEMATOCRIT: 27 % — AB (ref 36.0–46.0)
Hemoglobin: 8.4 g/dL — ABNORMAL LOW (ref 12.0–15.0)
LYMPHS PCT: 16 %
Lymphs Abs: 1.2 10*3/uL (ref 0.7–4.0)
MCH: 27.7 pg (ref 26.0–34.0)
MCHC: 31.1 g/dL (ref 30.0–36.0)
MCV: 89.1 fL (ref 78.0–100.0)
MONO ABS: 0.6 10*3/uL (ref 0.1–1.0)
Monocytes Relative: 8 %
NEUTROS ABS: 5.6 10*3/uL (ref 1.7–7.7)
Neutrophils Relative %: 76 %
Platelets: 202 10*3/uL (ref 150–400)
RBC: 3.03 MIL/uL — AB (ref 3.87–5.11)
RDW: 15.5 % (ref 11.5–15.5)
WBC: 7.4 10*3/uL (ref 4.0–10.5)

## 2015-11-12 LAB — RENAL FUNCTION PANEL
ALBUMIN: 1.8 g/dL — AB (ref 3.5–5.0)
ANION GAP: 6 (ref 5–15)
BUN: 10 mg/dL (ref 6–20)
CO2: 25 mmol/L (ref 22–32)
Calcium: 7.6 mg/dL — ABNORMAL LOW (ref 8.9–10.3)
Chloride: 108 mmol/L (ref 101–111)
Creatinine, Ser: 0.93 mg/dL (ref 0.44–1.00)
GFR calc Af Amer: >60 mL/min (ref >60)
GFR calc non Af Amer: 58 mL/min — ABNORMAL LOW (ref >60)
GLUCOSE: 92 mg/dL (ref 65–99)
PHOSPHORUS: 1.9 mg/dL — AB (ref 2.5–4.6)
POTASSIUM: 4.2 mmol/L (ref 3.5–5.1)
Sodium: 139 mmol/L (ref 135–145)

## 2015-11-12 LAB — GASTROINTESTINAL PANEL BY PCR, STOOL (REPLACES STOOL CULTURE)
ADENOVIRUS F40/41: NOT DETECTED
ASTROVIRUS: NOT DETECTED
CAMPYLOBACTER SPECIES: NOT DETECTED
CYCLOSPORA CAYETANENSIS: NOT DETECTED
Cryptosporidium: NOT DETECTED
ENTAMOEBA HISTOLYTICA: NOT DETECTED
ENTEROPATHOGENIC E COLI (EPEC): NOT DETECTED
ENTEROTOXIGENIC E COLI (ETEC): NOT DETECTED
Enteroaggregative E coli (EAEC): NOT DETECTED
Giardia lamblia: NOT DETECTED
NOROVIRUS GI/GII: NOT DETECTED
Plesimonas shigelloides: NOT DETECTED
Rotavirus A: NOT DETECTED
SAPOVIRUS (I, II, IV, AND V): NOT DETECTED
SHIGA LIKE TOXIN PRODUCING E COLI (STEC): NOT DETECTED
Salmonella species: NOT DETECTED
Shigella/Enteroinvasive E coli (EIEC): NOT DETECTED
VIBRIO CHOLERAE: NOT DETECTED
VIBRIO SPECIES: NOT DETECTED
Yersinia enterocolitica: NOT DETECTED

## 2015-11-12 LAB — TYPE AND SCREEN
ABO/RH(D): O POS
ANTIBODY SCREEN: NEGATIVE
UNIT DIVISION: 0

## 2015-11-12 LAB — PROCALCITONIN: Procalcitonin: 0.64 ng/mL

## 2015-11-12 LAB — MAGNESIUM: Magnesium: 1.5 mg/dL — ABNORMAL LOW (ref 1.7–2.4)

## 2015-11-12 LAB — T3: T3, Total: 81 ng/dL (ref 71–180)

## 2015-11-12 LAB — MRSA PCR SCREENING: MRSA by PCR: NEGATIVE

## 2015-11-12 LAB — TROPONIN I: Troponin I: <0.03 ng/mL (ref <0.03)

## 2015-11-12 MED ORDER — LEVALBUTEROL HCL 0.63 MG/3ML IN NEBU
0.6300 mg | INHALATION_SOLUTION | RESPIRATORY_TRACT | Status: DC | PRN
  Administered 2015-11-13: 0.63 mg via RESPIRATORY_TRACT
  Filled 2015-11-12: qty 3

## 2015-11-12 MED ORDER — MAGNESIUM SULFATE 4 GM/100ML IV SOLN
4.0000 g | Freq: Once | INTRAVENOUS | Status: AC
  Administered 2015-11-12: 4 g via INTRAVENOUS
  Filled 2015-11-12: qty 100

## 2015-11-12 MED ORDER — POTASSIUM PHOSPHATES 15 MMOLE/5ML IV SOLN
10.0000 mmol | Freq: Once | INTRAVENOUS | Status: AC
  Administered 2015-11-12: 10 mmol via INTRAVENOUS
  Filled 2015-11-12 (×2): qty 3.33

## 2015-11-12 MED ORDER — METOPROLOL TARTRATE 12.5 MG HALF TABLET
12.5000 mg | ORAL_TABLET | Freq: Two times a day (BID) | ORAL | Status: DC
  Administered 2015-11-12 – 2015-11-14 (×5): 12.5 mg via ORAL
  Filled 2015-11-12 (×5): qty 1

## 2015-11-12 MED ORDER — BOOST / RESOURCE BREEZE PO LIQD
1.0000 | Freq: Three times a day (TID) | ORAL | Status: DC
  Administered 2015-11-12 (×2): 1 via ORAL
  Administered 2015-11-13: 237 mL via ORAL
  Administered 2015-11-13 – 2015-11-15 (×7): 1 via ORAL

## 2015-11-12 MED ORDER — SODIUM CHLORIDE 0.9 % IV SOLN
INTRAVENOUS | Status: DC
  Administered 2015-11-12 – 2015-11-13 (×2): via INTRAVENOUS

## 2015-11-12 NOTE — Progress Notes (Signed)
PULMONARY / CRITICAL CARE MEDICINE   Name: Dawn [name] MRN: [id number] DOB: 1940-09-04    ADMISSION DATE:  11/10/2015 CONSULTATION DATE:  11/11/2015  REFERRING MD:  Oren Binet, M.D. / [facility]  CHIEF COMPLAINT:  Shock & Encephalopathy   SUBJECTIVE: Pt much better this AM, in good spirits.  Did have 2 episodes diarrhea overnight but otherwise no events. Is curious as to how long she will have to stay in hospital for.  VITAL SIGNS: BP (!) 106/53 (BP Location: Left Arm)   Pulse 100   Temp 97.8 F (36.6 C) (Oral)   Resp (!) 26   Ht '5\' 8"'$  (1.727 m)   Wt 128 lb (58.1 kg)   SpO2 98%   BMI 19.46 kg/m   HEMODYNAMICS:    VENTILATOR SETTINGS:    INTAKE / OUTPUT: I/O last 3 completed shifts: In: 3588.8 [P.O.:620; I.V.:2200.8; Blood:368; IV [id number]:923] Out: 2175 [Urine:2175]  PHYSICAL EXAMINATION: General:  Adult female, in NAD. Integument:  Warm & dry. No rash on exposed skin. HEENT:  Dry mucus membranes. No oral ulcers. No scleral injection or icterus.  Cardiovascular:  Regular rhythm. No edema Pulmonary:  Good aeration & clear to auscultation bilaterally. Symmetric chest wall expansion. No accessory muscle use on room air. Abdomen: Soft. Normal bowel sounds. NT/ND. Musculoskeletal:  Normal bulk and tone.No joint deformity or effusion appreciated. Neurological:  CN 2-12 grossly in tact. No meningismus. Moving all 4 extremities equally. Following commands.  LABS:  BMET  Recent Labs Lab 11/10/15 0220 11/11/15 0734 11/12/15 0455  NA 136 141 139  K 3.6 3.3* 4.2  CL 102 114* 108  CO2 25 19* 25  BUN '17 13 10  '$ CREATININE 1.56* 0.97 0.93  GLUCOSE 115* 95 92    Electrolytes  Recent Labs Lab 11/10/15 0220 11/11/15 0734 11/12/15 0455  CALCIUM 8.1* 7.0* 7.6*  MG  --   --  1.5*  PHOS  --   --  1.9*    CBC  Recent Labs Lab 11/11/15 0734 11/11/15 1805 11/12/15 0455  WBC 7.1 8.6 7.4  HGB 6.4* 8.2* 8.4*  HCT 20.9* 26.2* 27.0*  PLT 153 198 202     Coag's No results for input(s): APTT, INR in the last 168 hours.  Sepsis Markers  Recent Labs Lab 11/10/15 0231 11/10/15 0903 11/11/15 0734 11/11/15 1805 11/12/15 0455  LATICACIDVEN 1.30  --  0.8  --   --   PROCALCITON  --  0.19  --  0.59 0.64    ABG  Recent Labs Lab 11/10/15 1100  PHART 7.444  PCO2ART 31.6*  PO2ART 105    Liver Enzymes  Recent Labs Lab 11/10/15 0220 11/11/15 0734 11/12/15 0455  AST 12* 12*  --   ALT 9* 8*  --   ALKPHOS 68 52  --   BILITOT 0.6 0.4  --   ALBUMIN 2.5* 1.7* 1.8*    Cardiac Enzymes  Recent Labs Lab 11/11/15 1805 11/11/15 2245 11/12/15 0455  TROPONINI <0.03 <0.03 <0.03    Glucose No results for input(s): GLUCAP in the last 168 hours.  Imaging No results found.   STUDIES:  CT HEAD W/O 10/31: No acute intracranial findings. There is moderate generalized atrophy and chronic appearing white matter hypodensities which likely represent small vessel ischemic disease.  MICROBIOLOGY: Blood Ctx x2 10/31 >> GPC's > Urine Ctx 10/31 >> U/A 10/31:  Hyaline Casts / SG 1.018 / leukocytes small / Nitrite negative / WBC 0-5 MRSA PCR 11/1 >> C diff PCR 11/1 >>  neg.  ANTIBIOTICS: Rocephin 10/31 >> Vancomycin 10/31 >> Flagyl IV 11/1 >> 11/2  SIGNIFICANT EVENTS: 10/31 - Admit with altered mental status & hypotension.  LINES/TUBES: PIV x2  DISCUSSION:  75 y.o. female presenting with hypotension and acute encephalopathy. Patient currently protecting her airway but hypotensive despite aggressive fluid resuscitation - concerning for ongoing sepsis. Checking cardiac biomarkers and transthoracic echocardiogram. No evidence of adrenal insufficiency at this time. Holding home antihypertensive regimen.    ASSESSMENT / PLAN:  CARDIOVASCULAR A:  Shock - Sepsis versus cardiogenic. Cortisol 21.4. Daughter previously reported normal SBPs in the 90s.  Now resolved AM 11/2. Ectopic Atrial Tachycardia - Intermittent. H/O HTN. P:   Cardiology Consulted & following. Continue IVF's - starting NS @ 75 given minimal PO intake. No role vasopressors for now. F/u on echo. Cards starting low dose lopressor 11/2. Holding home Diovan.  NEUROLOGIC A:   Acute Encephalopathy - Likely multifactorial from hypotension & toxic metabolic. RPR negative. H/O Anxiety/Depression. Chronic Benzodiazepine Use - Home Xanax & UDS positive. P:  Limit sedating medications. Holding home Wellbutrin XL, Oxycodone, Xanax, Trazodone & Neurontin.  INFECTIOUS A:   Possible Sepsis - Possible GI vs GU source.  Favor hypotension to be due to hypovolemia. Diarrheal Illness vs UTI - UTI less likely but U/A bland after Macrobid. P:   Continuing Empiric Vancomycin & Rocephin Day #2. D/c flagyl as C.diff negative. Follow cultures. F/u on Gastrointestinal PCR, & Norovirus PCR. Enteric Precautions placed. Trending Procalcitonin per algorithm.   RENAL A:   Hypokalemia - Resolved 11/2. Metabolic Acidosis - Resolved 11/2.  Mild, Likely due to GI losses in conjunction w/ hyperchloremia. Hyperchloremia - Resolved 11/2. Mild, Likely due to fluid resuscitation. P:   Monitoring UO. Trending electrolytes & renal function daily.  GASTROINTESTINAL A:   Diarrheal Illness. Nausea w/ Emesis. P:   NPO except Ice Chips. Zofran IV prn.  HEMATOLOGIC A:   Anemia - Chronic Hgb around 10. Ferritin 151 & B12 1511. Some element of dilution. No signs of active bleeding. S/P 1u PRBC 11/1. P:  Trending cell counts daily w/ CBC. Transfuse for Hgb <7.0 or active bleeding. SCDs / Lovenox.  ENDOCRINE A:   Possible Hyperthyroidism - TSH 0.289 & Free T4 2.40.   P:   Holding on Tapazole for now. Monitoring glucose on daily labs - A1c 5.5.  PULMONARY A: H/O COPD. P:  Levalbuterol PRN. Holding [place].  Nothing further to add.  PCCM will sign off.  Please do not hesitate to call us back if we can be of any further assistance.   FAMILY  - Updates: No  family at bedside 11/1. Attempted to contact daughter Dawn [name] via phone but no answer 11/1 left a message to call the unit.   - Inter-disciplinary family meet or Palliative Care meeting due by:  11/7   Montey Hora, [place] Pager: [phone]  or [phone] 11/12/2015, 12:41 PM

## 2015-11-12 NOTE — Progress Notes (Signed)
PROGRESS NOTE    Dawn [name]  KDT:[id number] DOB: 24-May-1940 DOA: 11/10/2015 PCP: [name], MD   Brief Narrative: Patient is a 75 y.o. female with past medical history of anxiety and depression, recently treated in the outpatient setting for a UTI with Macrobid, admitted for evaluation of acute encephalopathy. Per family, patient has been lethargic, with no significant oral intake for at least 2 weeks prior to this admission. She is also had some intermittent nausea and vomiting. She was subsequently admitted and started on broad-spectrum antibiotics. See below for further details  Subjective: Patient is doing much better today and is interacting appropriately. She had 2-3 loose stools last night as well a a mild headache. Otherwise, patient is doing well.   No chest pain No shortness of breath  Assessment/Plan: Principal Problem: Acute encephalopathy: Likely metabolic encephalopathy in a setting of UTI. Much improved-she is much less lethargic today and is much more awake and alert. Blood culture shows gram positive cocci. Will continue empiric antibiotics. Since clinically improved, doubt further evaluation for encephalopathy is required.  Note, CT head on 10/31 negative for acute abnormalities.  Active Problems: Sepsis:  continue to suspect that this is secondary to UTI, however 1 set of blood cultures is positive for gram-positive cocci. C. difficile PCR is negative. Sepsis pathophysiology is resolving-blood pressure is now much more stable, suspect we can now discontinue Flagyl and just continue with Rocephin and vancomycin pending further culture data. Appreciate PCCM assistance.  SVT: Likely provoked by acute illness-appears to have hyperthyroidism as well. Given improvement in her blood pressure today, cardiology has started low-dose metoprolol. Await echo.  Acute kidney injury: Likely mild prerenal azotemia setting of sepsis/UTI/poor oral intake. Resolved with IV  fluids.  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 Start her on low-dose metoprolol today.  Anemia: Appears to have chronic anemia at baseline-suspect acute drop in hemoglobin due to IV fluid dilution, acute illness. Transfused 1 unit of PRBC on 11/1-hemoglobin stable at 8.4 today. Follow Hb  Anxiety and depression: Continue Xanax-given lethargy-continue to hold Wellbutrin.  History of chronic back pain: Continue to hold narcotics given hypotension and lethargy.  Chronic hypotension: Per family (daughter at bedside this morning) patient's blood pressure is mostly in the 99I systolic range-random cortisol levels appear appropriate- continue to monitor in stepdown   DVT Prophylaxis: Prophylactic Lovenox  Code Status: Full code   Family Communication: None at bedside this am  Disposition Plan: Remain inpatient-will require several more days of hospitalization prior to consideration of discharge-remain ins SDU for another day or so  Antimicrobial agents: Vanco 11/1>> Flagyl 11/1>>11/2 Rocephin 10/31>>  Procedures: None  CONSULTS: PCCM Cards   Objective: Vitals:   11/11/15 2133 11/12/15 0000 11/12/15 0450 11/12/15 0823  BP: 119/69 (!) 110/56 112/62 (!) 106/53  Pulse: (!) 50 98 95 100  Resp: (!) 25 (!) 25 16 (!) 26  Temp: 98.3 F (36.8 C) 98.5 F (36.9 C) 98.8 F (37.1 C) 97.8 F (36.6 C)  TempSrc: Oral Oral Oral Oral  SpO2: 98% 97% 98% 98%  Weight:      Height:        Intake/Output Summary (Last 24 hours) at 11/12/15 0843 Last data filed at 11/12/15 0600  Gross per 24 hour  Intake          1868.83 ml  Output             1675 ml  Net  193.83 ml   Filed Weights   11/10/15 0148  Weight: 58.1 kg (128 lb)    Examination:  General exam: Appears calm and comfortable  Respiratory system: Clear to auscultation. Respiratory effort normal. Cardiovascular system: S1 & S2 heard, RRR.  No JVD, murmurs, rubs, gallops or clicks. No pedal edema. Gastrointestinal system: Abdomen is nondistended, soft and nontender. No organomegaly or masses felt. Central nervous system: Alert and oriented. No focal neurological deficits. Extremities: Symmetric 5 x 5 power. Psychiatry: Judgement and insight appear normal. Mood & affect appropriate.     Data Reviewed: I have personally reviewed following labs and imaging studies  CBC:  Recent Labs Lab 11/10/15 0220 11/11/15 0734 11/11/15 1805 11/12/15 0455  WBC 9.8 7.1 8.6 7.4  NEUTROABS 7.5  --   --  5.6  HGB 8.5* 6.4* 8.2* 8.4*  HCT 28.2* 20.9* 26.2* 27.0*  MCV 92.8 91.7 90.0 89.1  PLT 351 153 198 935   Basic Metabolic Panel:  Recent Labs Lab 11/10/15 0220 11/11/15 0734 11/12/15 0455  NA 136 141 139  K 3.6 3.3* 4.2  CL 102 114* 108  CO2 25 19* 25  GLUCOSE 115* 95 92  BUN '17 13 10  '$ CREATININE 1.56* 0.97 0.93  CALCIUM 8.1* 7.0* 7.6*  MG  --   --  1.5*  PHOS  --   --  1.9*   GFR: Estimated Creatinine Clearance: 47.2 mL/min (by C-G formula based on SCr of 0.93 mg/dL). Liver Function Tests:  Recent Labs Lab 11/10/15 0220 11/11/15 0734 11/12/15 0455  AST 12* 12*  --   ALT 9* 8*  --   ALKPHOS 68 52  --   BILITOT 0.6 0.4  --   PROT 6.4* 4.6*  --   ALBUMIN 2.5* 1.7* 1.8*   No results for input(s): LIPASE, AMYLASE in the last 168 hours. No results for input(s): AMMONIA in the last 168 hours. Coagulation Profile: No results for input(s): INR, PROTIME in the last 168 hours. Cardiac Enzymes:  Recent Labs Lab 11/10/15 0220 11/11/15 1805 11/11/15 2245 11/12/15 0455  TROPONINI <0.03 <0.03 <0.03 <0.03   BNP (last 3 results) No results for input(s): PROBNP in the last 8760 hours. HbA1C:  Recent Labs  11/10/15 0903  HGBA1C 5.5   CBG: No results for input(s): GLUCAP in the last 168 hours. Lipid Profile: No results for input(s): CHOL, HDL, LDLCALC, TRIG, CHOLHDL, LDLDIRECT in the last 72 hours. Thyroid  Function Tests:  Recent Labs  11/10/15 0903 11/11/15 0049  TSH 0.289*  --   FREET4  --  2.40*   Anemia Panel:  Recent Labs  11/10/15 0903  VITAMINB12 1,511*  FOLATE 21.9  FERRITIN 151  TIBC 153*  IRON 9*  RETICCTPCT 2.8   Sepsis Labs:  Recent Labs Lab 11/10/15 0231 11/10/15 0903 11/11/15 0734 11/11/15 1805 11/12/15 0455  PROCALCITON  --  0.19  --  0.59 0.64  LATICACIDVEN 1.30  --  0.8  --   --     Recent Results (from the past 240 hour(s))  Urine culture     Status: None   Collection Time: 11/10/15  2:30 AM  Result Value Ref Range Status   Specimen Description   Final    URINE, CLEAN CATCH Performed at [place] Requests NONE  Final   Culture NO GROWTH  Final   Report Status 11/11/2015 FINAL  Final  Culture, blood (Routine X 2) w Reflex to ID Panel  Status: None (Preliminary result)   Collection Time: 11/10/15  9:15 AM  Result Value Ref Range Status   Specimen Description BLOOD LEFT ARM  Final   Special Requests IN PEDIATRIC BOTTLE 2CC  Final   Culture NO GROWTH 2 DAYS  Final   Report Status PENDING  Incomplete  Culture, blood (Routine X 2) w Reflex to ID Panel     Status: None (Preliminary result)   Collection Time: 11/10/15  9:20 AM  Result Value Ref Range Status   Specimen Description BLOOD LEFT HAND  Final   Special Requests IN PEDIATRIC BOTTLE 2CC  Final   Culture  Setup Time   Final    AEROBIC BOTTLE ONLY GRAM POSITIVE COCCI IN CLUSTERS Organism ID to follow CRITICAL RESULT CALLED TO, READ BACK BY AND VERIFIED WITH: GREG ABBOTT,PHARMD '@0019'$  11/12/15 MKELLY,MLT    Culture GRAM POSITIVE COCCI  Final   Report Status PENDING  Incomplete  C difficile quick scan w PCR reflex     Status: None   Collection Time: 11/11/15  4:54 PM  Result Value Ref Range Status   C Diff antigen NEGATIVE NEGATIVE Final   C Diff toxin NEGATIVE NEGATIVE Final   C Diff interpretation No C. difficile detected.  Final  MRSA PCR Screening      Status: None   Collection Time: 11/11/15 11:43 PM  Result Value Ref Range Status   MRSA by PCR NEGATIVE NEGATIVE Final    Comment:        The GeneXpert MRSA Assay (FDA approved for NASAL specimens only), is one component of a comprehensive MRSA colonization surveillance program. It is not intended to diagnose MRSA infection nor to guide or monitor treatment for MRSA infections.          Radiology Studies: Dg Chest 2 View  Result Date: 11/10/2015 CLINICAL DATA:  Hypoxemia EXAM: CHEST  2 VIEW COMPARISON:  03/19/2009 FINDINGS: Cardiac shadow remains enlarged. Aortic calcifications are noted. The lungs demonstrate scattered interstitial changes without focal infiltrate. Some scattered scarring is noted in the left lung base stable from the prior exam. No acute infiltrate or sizable effusion is noted. Old rib fractures on the right and old right clavicular fracture are noted. IMPRESSION: Chronic changes without acute abnormality. Electronically Signed   By: Inez Catalina M.D.   On: 11/10/2015 14:31        Scheduled Meds: . cefTRIAXone (ROCEPHIN)  IV  2 g Intravenous Q24H  . enoxaparin (LOVENOX) injection  40 mg Subcutaneous Q24H  . metronidazole  500 mg Intravenous Q8H  . vancomycin  500 mg Intravenous Q12H   Continuous Infusions: . lactated ringers 10 mL/hr at 11/12/15 0042     LOS: 2 days    Time spent: [place], PA-S [facility] Pager [phone]  If 7PM-7AM, please contact night-coverage [email or web address] Password [facility] 11/12/2015, 8:43 AM    Attending MD note  Patient was seen, examined,treatment plan was discussed with the PA-S.  I have personally reviewed the clinical findings, lab, imaging studies and management of this patient in detail. I agree with the documentation, as recorded by the PA-S.   Patient appears to be markedly better today-she is much more awake and alert. Answering all my questions appropriately.  On Exam: Gen. exam:  Awake, alert, not in any distress Chest: Good air entry bilaterally, no rhonchi or rales CVS: S1-S2 regular, no murmurs Abdomen: Soft, nontender and nondistended Neurology: Non-focal Skin: No rash or lesions  Impression/plan Acute encephalopathy: Presumed metabolic  encephalopathy in a setting of sepsis. Improved markedly compared to yesterday. Sepsis: Probably secondary to UTI, however 1 set of blood cultures positive for gram-positive cocci-could have gram-positive bacteremia. Continue Rocephin/vancomycin for now. C. difficile PCR is negative-and hence can discontinue Flagyl. Blood pressure is much better today. Acute kidney injury: Resolved  Rest as above  [facility]

## 2015-11-12 NOTE — Progress Notes (Signed)
    Subjective:  Denies CP or dyspnea   Objective:  Vitals:   11/11/15 2133 11/12/15 0000 11/12/15 0450 11/12/15 0823  BP: 119/69 (!) 110/56 112/62 (!) 106/53  Pulse: (!) 50 98 95 100  Resp: (!) 25 (!) 25 16 (!) 26  Temp: 98.3 F (36.8 C) 98.5 F (36.9 C) 98.8 F (37.1 C) 97.8 F (36.6 C)  TempSrc: Oral Oral Oral Oral  SpO2: 98% 97% 98% 98%  Weight:      Height:        Intake/Output from previous day:  Intake/Output Summary (Last 24 hours) at 11/12/15 1005 Last data filed at 11/12/15 0600  Gross per 24 hour  Intake          1868.83 ml  Output             1675 ml  Net           193.83 ml    Physical Exam: Physical exam: Well-developed chronically ill appearing in no acute distress.  Skin is warm and dry.  HEENT is normal.  Neck is supple.  Chest is clear to auscultation with normal expansion.  Cardiovascular exam is regular rate and tachycardic Abdominal exam nontender or distended. No masses palpated. Extremities show no edema. neuro grossly intact    Lab Results: Basic Metabolic Panel:  Recent Labs  11/11/15 0734 11/12/15 0455  NA 141 139  K 3.3* 4.2  CL 114* 108  CO2 19* 25  GLUCOSE 95 92  BUN 13 10  CREATININE 0.97 0.93  CALCIUM 7.0* 7.6*  MG  --  1.5*  PHOS  --  1.9*   CBC:  Recent Labs  11/10/15 0220  11/11/15 1805 11/12/15 0455  WBC 9.8  < > 8.6 7.4  NEUTROABS 7.5  --   --  5.6  HGB 8.5*  < > 8.2* 8.4*  HCT 28.2*  < > 26.2* 27.0*  MCV 92.8  < > 90.0 89.1  PLT 351  < > 198 202  < > = values in this interval not displayed. Cardiac Enzymes:  Recent Labs  11/11/15 1805 11/11/15 2245 11/12/15 0455  TROPONINI <0.03 <0.03 <0.03     Assessment/Plan:  1 Supraventricular tachycardia-telemetry has been reviewed personally. Patient continues to have ectopic atrial tachycardia intermittently. However she is asymptomatic. We will try low dose lopressor; follow BP closely. Await echocardiogram.  2 sinus tachycardia-this is likely  being driven by severe anemia, dehydration and hyperthyroidism. I would expect this to improve as this is treated.  3 severe normocytic anemia- further evaluation per primary care. No evidence of GI bleeding.   4 Hyperthyroidism-management per primary care.  5 Urinary tract infection/dehydration-agree with IV fluids. Further treatment per primary service.  Kirk Ruths 11/12/2015, 10:05 AM

## 2015-11-12 NOTE — Progress Notes (Signed)
Initial Nutrition Assessment  DOCUMENTATION CODES:   Not applicable  INTERVENTION:    Boost Breeze po TID, each supplement provides 250 kcal and 9 grams of protein  NUTRITION DIAGNOSIS:   Inadequate oral intake related to lethargy/confusion as evidenced by meal completion < 25%  GOAL:   Patient will meet greater than or equal to 90% of their needs  MONITOR:   PO intake, Supplement acceptance, Labs, Weight trends, I & O's  REASON FOR ASSESSMENT:   Malnutrition Screening Tool  ASSESSMENT:   75 y.o.femalewith past medical history of anxiety and depression, recently treated in the outpatient setting for a UTI with Macrobid, admitted for evaluation of acute encephalopathy. Per family, patient has been lethargic, with no significant oral intake for at least 2 weeks prior to this admission. She is also had some intermittent nausea and vomiting. She was subsequently admitted and started on broad-spectrum antibiotics. See below for further details.  Pt sleeping upon RD visit >> did not wake. Per Malnutrition Screening Tool Report, pt eating poorly because of a decreased appetite. Pt also with weight loss without trying. PO intake 0% per flowsheet records >> will add supplements.  RD unable to complete Nutrition Focused Physical Exam at this time.  Diet Order:  Diet Heart Room service appropriate? Yes; Fluid consistency: Thin  Skin:  Reviewed, no issues  Last BM:  11/2  Height:   Ht Readings from Last 1 Encounters:  11/10/15 '5\' 8"'$  (1.727 m)    Weight:   Wt Readings from Last 1 Encounters:  11/10/15 128 lb (58.1 kg)    Ideal Body Weight:  64 kg  BMI:  Body mass index is 19.46 kg/m.  Estimated Nutritional Needs:   Kcal:  1500-1700  Protein:  75-85 gm  Fluid:  1.5-1.7 L  EDUCATION NEEDS:   No education needs identified at this time  Arthur Holms, RD, LDN Pager #: [phone] After-Hours Pager #: [phone]

## 2015-11-13 DIAGNOSIS — R638 Other symptoms and signs concerning food and fluid intake: Secondary | ICD-10-CM

## 2015-11-13 DIAGNOSIS — R770 Abnormality of albumin: Secondary | ICD-10-CM

## 2015-11-13 DIAGNOSIS — R946 Abnormal results of thyroid function studies: Secondary | ICD-10-CM

## 2015-11-13 DIAGNOSIS — R197 Diarrhea, unspecified: Secondary | ICD-10-CM

## 2015-11-13 DIAGNOSIS — R509 Fever, unspecified: Secondary | ICD-10-CM

## 2015-11-13 DIAGNOSIS — R5381 Other malaise: Secondary | ICD-10-CM

## 2015-11-13 DIAGNOSIS — I959 Hypotension, unspecified: Secondary | ICD-10-CM

## 2015-11-13 DIAGNOSIS — Z833 Family history of diabetes mellitus: Secondary | ICD-10-CM

## 2015-11-13 DIAGNOSIS — Z8249 Family history of ischemic heart disease and other diseases of the circulatory system: Secondary | ICD-10-CM

## 2015-11-13 DIAGNOSIS — Z87891 Personal history of nicotine dependence: Secondary | ICD-10-CM

## 2015-11-13 DIAGNOSIS — J9601 Acute respiratory failure with hypoxia: Secondary | ICD-10-CM

## 2015-11-13 DIAGNOSIS — R0600 Dyspnea, unspecified: Secondary | ICD-10-CM

## 2015-11-13 LAB — CBC WITH DIFFERENTIAL/PLATELET
BASOS ABS: 0 10*3/uL (ref 0.0–0.1)
Basophils Relative: 0 %
EOS PCT: 0 %
Eosinophils Absolute: 0 10*3/uL (ref 0.0–0.7)
HEMATOCRIT: 26.3 % — AB (ref 36.0–46.0)
Hemoglobin: 8.1 g/dL — ABNORMAL LOW (ref 12.0–15.0)
LYMPHS ABS: 0.9 10*3/uL (ref 0.7–4.0)
LYMPHS PCT: 12 %
MCH: 27.6 pg (ref 26.0–34.0)
MCHC: 30.8 g/dL (ref 30.0–36.0)
MCV: 89.5 fL (ref 78.0–100.0)
MONO ABS: 0.8 10*3/uL (ref 0.1–1.0)
MONOS PCT: 11 %
NEUTROS ABS: 5.8 10*3/uL (ref 1.7–7.7)
Neutrophils Relative %: 77 %
PLATELETS: 243 10*3/uL (ref 150–400)
RBC: 2.94 MIL/uL — ABNORMAL LOW (ref 3.87–5.11)
RDW: 15.5 % (ref 11.5–15.5)
WBC: 7.5 10*3/uL (ref 4.0–10.5)

## 2015-11-13 LAB — CULTURE, BLOOD (ROUTINE X 2)

## 2015-11-13 LAB — RENAL FUNCTION PANEL
ALBUMIN: 1.7 g/dL — AB (ref 3.5–5.0)
ANION GAP: 8 (ref 5–15)
BUN: 13 mg/dL (ref 6–20)
CHLORIDE: 106 mmol/L (ref 101–111)
CO2: 25 mmol/L (ref 22–32)
Calcium: 7.3 mg/dL — ABNORMAL LOW (ref 8.9–10.3)
Creatinine, Ser: 1.1 mg/dL — ABNORMAL HIGH (ref 0.44–1.00)
GFR calc Af Amer: 55 mL/min — ABNORMAL LOW (ref >60)
GFR calc non Af Amer: 48 mL/min — ABNORMAL LOW (ref >60)
GLUCOSE: 125 mg/dL — AB (ref 65–99)
POTASSIUM: 3.6 mmol/L (ref 3.5–5.1)
Phosphorus: 3.7 mg/dL (ref 2.5–4.6)
Sodium: 139 mmol/L (ref 135–145)

## 2015-11-13 LAB — MAGNESIUM: Magnesium: 2.4 mg/dL (ref 1.7–2.4)

## 2015-11-13 LAB — PROCALCITONIN: Procalcitonin: 0.38 ng/mL

## 2015-11-13 MED ORDER — ACETAMINOPHEN 325 MG PO TABS
650.0000 mg | ORAL_TABLET | ORAL | Status: DC | PRN
  Administered 2015-11-13 – 2015-11-19 (×7): 650 mg via ORAL
  Filled 2015-11-13 (×7): qty 2

## 2015-11-13 MED ORDER — GABAPENTIN 600 MG PO TABS
600.0000 mg | ORAL_TABLET | Freq: Three times a day (TID) | ORAL | Status: DC
  Administered 2015-11-13 – 2015-11-20 (×19): 600 mg via ORAL
  Filled 2015-11-13 (×21): qty 1

## 2015-11-13 MED ORDER — LOPERAMIDE HCL 2 MG PO CAPS
2.0000 mg | ORAL_CAPSULE | ORAL | Status: DC | PRN
  Administered 2015-11-13 – 2015-11-15 (×3): 2 mg via ORAL
  Filled 2015-11-13 (×4): qty 1

## 2015-11-13 MED ORDER — ALPRAZOLAM 0.5 MG PO TABS
0.5000 mg | ORAL_TABLET | Freq: Three times a day (TID) | ORAL | Status: DC
  Administered 2015-11-13 – 2015-11-20 (×18): 0.5 mg via ORAL
  Filled 2015-11-13 (×19): qty 1

## 2015-11-13 MED ORDER — METHIMAZOLE 5 MG PO TABS
5.0000 mg | ORAL_TABLET | Freq: Two times a day (BID) | ORAL | Status: DC
  Administered 2015-11-13 – 2015-11-14 (×4): 5 mg via ORAL
  Filled 2015-11-13 (×5): qty 1

## 2015-11-13 MED ORDER — OXYCODONE HCL 5 MG PO TABS
5.0000 mg | ORAL_TABLET | Freq: Three times a day (TID) | ORAL | Status: DC
  Administered 2015-11-13 – 2015-11-20 (×19): 5 mg via ORAL
  Filled 2015-11-13 (×21): qty 1

## 2015-11-13 MED ORDER — SODIUM CHLORIDE 0.9 % IV BOLUS (SEPSIS)
500.0000 mL | Freq: Once | INTRAVENOUS | Status: AC
  Administered 2015-11-13: 500 mL via INTRAVENOUS

## 2015-11-13 MED ORDER — BUPROPION HCL ER (XL) 150 MG PO TB24
150.0000 mg | ORAL_TABLET | Freq: Every day | ORAL | Status: DC
  Administered 2015-11-13 – 2015-11-20 (×8): 150 mg via ORAL
  Filled 2015-11-13 (×8): qty 1

## 2015-11-13 NOTE — Care Management Note (Signed)
Case Management Note  Patient Details  Name: Dawn [name] MRN: [id number] Date of Birth: 09-Aug-1940  Subjective/Objective:   Pt admitted on 11/10/15 with acute encephalopathy, sepsis, and SVT.  PTA, pt independent of ADLS; has supportive daughter.                   Action/Plan: Will follow for discharge planning as pt progresses.  PT evaluation pending.    Expected Discharge Date:                  Expected Discharge Plan:  [place]  In-House Referral:     Discharge planning Services  CM Consult  Post Acute Care Choice:    Choice offered to:     DME Arranged:    DME Agency:     HH Arranged:    [place] Agency:     Status of Service:  In process, will continue to follow  If discussed at Long Length of Stay Meetings, dates discussed:    Additional Comments:  Ella Bodo, RN 11/13/2015, 3:17 PM

## 2015-11-13 NOTE — Progress Notes (Addendum)
PROGRESS NOTE    Dawn [name]  ZOX:[id number] DOB: [birth year] DOA: 11/10/2015 PCP: [name], MD   Brief Narrative: Patient is a 75 y.o. female with past medical history of anxiety and depression, recently treated in the outpatient setting for a UTI with Macrobid, admitted for evaluation of acute encephalopathy. Per family, patient has been lethargic, with no significant oral intake for at least 2 weeks prior to this admission. She is also had some intermittent nausea and vomiting. She was found to be febrile, with low blood pressures and admitted to the hospital with empiric antibiotics and aggressive IV fluid resuscitation. She has now stabilized, however continues to have fever. Culture data and stool studies are unrevealing so far. Hospital course complicated by development of SVT , and new diagnosis of hyperthyroidism See below for further details  Subjective: Continues to have some intermittent diarrhea, but continues to improve-is awake and alert. Fever again last night.   Complains of mild shortness of breath.  Assessment/Plan: Principal Problem: Acute encephalopathy: Likely metabolic encephalopathy in a setting of sepsis. Her mentation has improved significantly, she is no longer lethargic. She is awake and alert.. Since clinically improved, doubt further evaluation for encephalopathy is required.  Note, CT head on 10/31 negative for acute abnormalities.  Active Problems: Sepsis: Sepsis pathophysiology has resolved, she no longer is hypotensive. Foci of infection remains unknown-initially UTI was contemplated-however urine cultures are negative. One set of Blood cultures positive for coag negative staph-probably a contaminant-C. difficile PCR negative. Continue Vanco and Rocephin, will discuss with infectious disease.   SVT: Likely provoked by acute illness-appears to have hyperthyroidism as well. Given improvement in her blood pressure, cardiology slowly optimizing  dose of metoprolol. today, cardiology has started low-dose metoprolol. Echo with EF around 45-50%. This acknowledge mild shortness of breath-but lungs are clear on exam-stop fluids. Have asked RN to check her weight-might need Lasix. Follow  Acute kidney injury: Likely mild prerenal azotemia setting of sepsis/UTI/poor oral intake. Resolved with IV fluids.  Hyperthyroidism:  continue Tapazole-and metoprolol. Will need endocrinology follow-up as outpatient  Diarrhea: Continues-? Related to Rocephin-stool studies negative-start Imodium.  Anemia: Appears to have chronic anemia at baseline-suspect acute drop in hemoglobin due to IV fluid dilution, acute illness. Transfused 1 unit of PRBC on 11/1-hemoglobin stable at 8.1 today. Follow Hb  Anxiety and depression: Continue Xanax-given lethargy-continue to hold Wellbutrin.  History of chronic back pain: Continue to hold narcotics given hypotension and lethargy.  Chronic hypotension: Per family (daughter) patient's blood pressure is mostly in the 81X systolic range-random cortisol levels appear appropriate- continue to monitor in stepdown  DVT Prophylaxis: Prophylactic Lovenox  Code Status: Full code   Family Communication: None at bedside this am  Disposition Plan: Remain inpatient-will require several more days of hospitalization prior to consideration of discharge-remain ins SDU for another day or so  Antimicrobial agents: Vanco 11/1>> Flagyl 11/1>>11/2 Rocephin 10/31>>  Procedures: None  CONSULTS: PCCM Cards   Objective: Vitals:   11/13/15 0348 11/13/15 0400 11/13/15 0733 11/13/15 1217  BP: 113/60  104/72 108/60  Pulse: 94  76 84  Resp: (!) 27 (!) 24 (!) 23 (!) 24  Temp: 99.6 F (37.6 C)  98.3 F (36.8 C) 98.6 F (37 C)  TempSrc: Oral  Oral Oral  SpO2: 96%  100% 100%  Weight:      Height:        Intake/Output Summary (Last 24 hours) at 11/13/15 1254 Last data filed at 11/13/15 0841  Gross per 24 hour  Intake             2920 ml  Output              125 ml  Net             2795 ml   Filed Weights   11/10/15 0148  Weight: 58.1 kg (128 lb)    Examination:  General exam: Appears calm and comfortable  Respiratory system: Clear to auscultation. Respiratory effort normal. Cardiovascular system: S1 & S2 heard, RRR. No JVD, murmurs, rubs, gallops or clicks. No pedal edema. Gastrointestinal system: Abdomen is nondistended, soft and nontender. No organomegaly or masses felt. Central nervous system: Alert and oriented. No focal neurological deficits. Extremities: Symmetric 5 x 5 power. Psychiatry: Judgement and insight appear normal. Mood & affect appropriate.     Data Reviewed: I have personally reviewed following labs and imaging studies  CBC:  Recent Labs Lab 11/10/15 0220 11/11/15 0734 11/11/15 1805 11/12/15 0455 11/13/15 0129  WBC 9.8 7.1 8.6 7.4 7.5  NEUTROABS 7.5  --   --  5.6 5.8  HGB 8.5* 6.4* 8.2* 8.4* 8.1*  HCT 28.2* 20.9* 26.2* 27.0* 26.3*  MCV 92.8 91.7 90.0 89.1 89.5  PLT 351 153 198 202 253   Basic Metabolic Panel:  Recent Labs Lab 11/10/15 0220 11/11/15 0734 11/12/15 0455 11/13/15 0129  NA 136 141 139 139  K 3.6 3.3* 4.2 3.6  CL 102 114* 108 106  CO2 25 19* 25 25  GLUCOSE 115* 95 92 125*  BUN '17 13 10 13  '$ CREATININE 1.56* 0.97 0.93 1.10*  CALCIUM 8.1* 7.0* 7.6* 7.3*  MG  --   --  1.5* 2.4  PHOS  --   --  1.9* 3.7   GFR: Estimated Creatinine Clearance: 39.9 mL/min (by C-G formula based on SCr of 1.1 mg/dL (H)). Liver Function Tests:  Recent Labs Lab 11/10/15 0220 11/11/15 0734 11/12/15 0455 11/13/15 0129  AST 12* 12*  --   --   ALT 9* 8*  --   --   ALKPHOS 68 52  --   --   BILITOT 0.6 0.4  --   --   PROT 6.4* 4.6*  --   --   ALBUMIN 2.5* 1.7* 1.8* 1.7*   No results for input(s): LIPASE, AMYLASE in the last 168 hours. No results for input(s): AMMONIA in the last 168 hours. Coagulation Profile: No results for input(s): INR, PROTIME  in the last 168 hours. Cardiac Enzymes:  Recent Labs Lab 11/10/15 0220 11/11/15 1805 11/11/15 2245 11/12/15 0455  TROPONINI <0.03 <0.03 <0.03 <0.03   BNP (last 3 results) No results for input(s): PROBNP in the last 8760 hours. HbA1C: No results for input(s): HGBA1C in the last 72 hours. CBG: No results for input(s): GLUCAP in the last 168 hours. Lipid Profile: No results for input(s): CHOL, HDL, LDLCALC, TRIG, CHOLHDL, LDLDIRECT in the last 72 hours. Thyroid Function Tests:  Recent Labs  11/11/15 0049  FREET4 2.40*   Anemia Panel: No results for input(s): VITAMINB12, FOLATE, FERRITIN, TIBC, IRON, RETICCTPCT in the last 72 hours. Sepsis Labs:  Recent Labs Lab 11/10/15 0231 11/10/15 0903 11/11/15 0734 11/11/15 1805 11/12/15 0455 11/13/15 0129  PROCALCITON  --  0.19  --  0.59 0.64 0.38  LATICACIDVEN 1.30  --  0.8  --   --   --     Recent Results (from the past 240 hour(s))  Urine culture     Status: None   Collection  Time: 11/10/15  2:30 AM  Result Value Ref Range Status   Specimen Description   Final    URINE, CLEAN CATCH Performed at [place] Requests NONE  Final   Culture NO GROWTH  Final   Report Status 11/11/2015 FINAL  Final  Culture, blood (Routine X 2) w Reflex to ID Panel     Status: None (Preliminary result)   Collection Time: 11/10/15  9:15 AM  Result Value Ref Range Status   Specimen Description BLOOD LEFT ARM  Final   Special Requests IN PEDIATRIC BOTTLE 2CC  Final   Culture NO GROWTH 2 DAYS  Final   Report Status PENDING  Incomplete  Culture, blood (Routine X 2) w Reflex to ID Panel     Status: Abnormal   Collection Time: 11/10/15  9:20 AM  Result Value Ref Range Status   Specimen Description BLOOD LEFT HAND  Final   Special Requests IN PEDIATRIC BOTTLE 2CC  Final   Culture  Setup Time   Final    AEROBIC BOTTLE ONLY GRAM POSITIVE COCCI IN CLUSTERS CRITICAL RESULT CALLED TO, READ BACK BY AND VERIFIED WITH: GREG  ABBOTT,PHARMD '@0019'$  11/12/15 MKELLY,MLT    Culture (A)  Final    STAPHYLOCOCCUS SPECIES (COAGULASE NEGATIVE) THE SIGNIFICANCE OF ISOLATING THIS ORGANISM FROM A SINGLE VENIPUNCTURE CANNOT BE PREDICTED WITHOUT FURTHER CLINICAL AND CULTURE CORRELATION. SUSCEPTIBILITIES AVAILABLE ONLY ON REQUEST.    Report Status 11/13/2015 FINAL  Final  C difficile quick scan w PCR reflex     Status: None   Collection Time: 11/11/15  4:54 PM  Result Value Ref Range Status   C Diff antigen NEGATIVE NEGATIVE Final   C Diff toxin NEGATIVE NEGATIVE Final   C Diff interpretation No C. difficile detected.  Final  Gastrointestinal Panel by PCR , Stool     Status: None   Collection Time: 11/11/15  4:54 PM  Result Value Ref Range Status   Campylobacter species NOT DETECTED NOT DETECTED Final   Plesimonas shigelloides NOT DETECTED NOT DETECTED Final   Salmonella species NOT DETECTED NOT DETECTED Final   Yersinia enterocolitica NOT DETECTED NOT DETECTED Final   Vibrio species NOT DETECTED NOT DETECTED Final   Vibrio cholerae NOT DETECTED NOT DETECTED Final   Enteroaggregative E coli (EAEC) NOT DETECTED NOT DETECTED Final   Enteropathogenic E coli (EPEC) NOT DETECTED NOT DETECTED Final   Enterotoxigenic E coli (ETEC) NOT DETECTED NOT DETECTED Final   Shiga like toxin producing E coli (STEC) NOT DETECTED NOT DETECTED Final   Shigella/Enteroinvasive E coli (EIEC) NOT DETECTED NOT DETECTED Final   Cryptosporidium NOT DETECTED NOT DETECTED Final   Cyclospora cayetanensis NOT DETECTED NOT DETECTED Final   Entamoeba histolytica NOT DETECTED NOT DETECTED Final   Giardia lamblia NOT DETECTED NOT DETECTED Final   Adenovirus F40/41 NOT DETECTED NOT DETECTED Final   Astrovirus NOT DETECTED NOT DETECTED Final   Norovirus GI/GII NOT DETECTED NOT DETECTED Final   Rotavirus A NOT DETECTED NOT DETECTED Final   Sapovirus (I, II, IV, and V) NOT DETECTED NOT DETECTED Final  MRSA PCR Screening     Status: None   Collection Time:  11/11/15 11:43 PM  Result Value Ref Range Status   MRSA by PCR NEGATIVE NEGATIVE Final    Comment:        The GeneXpert MRSA Assay (FDA approved for NASAL specimens only), is one component of a comprehensive MRSA colonization surveillance program. It is not intended to diagnose  MRSA infection nor to guide or monitor treatment for MRSA infections.          Radiology Studies: No results found.      Scheduled Meds: . cefTRIAXone (ROCEPHIN)  IV  2 g Intravenous Q24H  . enoxaparin (LOVENOX) injection  40 mg Subcutaneous Q24H  . feeding supplement  1 Container Oral TID BM  . methimazole  5 mg Oral BID  . metoprolol tartrate  12.5 mg Oral BID  . vancomycin  500 mg Intravenous Q12H   Continuous Infusions: . lactated ringers 10 mL/hr at 11/13/15 0232     LOS: 3 days    Time spent: [place], [facility]   If 7PM-7AM, please contact night-coverage [email or web address] Password [email or web address] 11/13/2015, 12:54 PM

## 2015-11-13 NOTE — Consult Note (Signed)
[facility] for Infectious Disease       Reason for Consult: fever    Referring Physician: Dr. Sloan Leiter  Principal Problem:   Acute kidney injury [facility]) Active Problems:   Acute delirium   Normocytic anemia   Anxiety and depression   Chronic pain syndrome   Recurrent UTI   Acute respiratory failure with hypoxia (HCC)   COPD (chronic obstructive pulmonary disease) (HCC)   Acute hyperglycemia   CKD (chronic kidney disease) stage 3, GFR 30-59 ml/min   Narrow complex tachycardia (HCC)   HTN (hypertension)   Anemia   Delirium   Hypoxemia   Tachycardia   Generalized weakness   Septic shock (HCC)   Acute encephalopathy   Hypokalemia   Sepsis ([place])   Hyperthyroidism   Hypotension   . enoxaparin (LOVENOX) injection  40 mg Subcutaneous Q24H  . feeding supplement  1 Container Oral TID BM  . methimazole  5 mg Oral BID  . metoprolol tartrate  12.5 mg Oral BID    Recommendations: Stop antibiotics  Patient requesting her home inhalers  Assessment: She has had over 2 weeks of progressive symptoms of malaise, poor po, significant diarrhea multiple times per day, new supraventricular tachycardia and presented with significant hypotension but no signs of shock with normal lactate.  She also has had dypsnea and a low albumin.  She also has had a fever to 103 on 10/31 and 101 today. This is in the setting of a low TSH of 0.289 and elevated free T4 of 2.40 which would be most c/w the above causes.  I do not see any signs of infection with a normal WBC, normal lactate, negative cultures except for 1/2 with CoNS.  She was not having any urinary symptoms by her report though UA was checked regardless and did not have any WBCs and she was on treatment as an outpatient.  Fever could also be from methimazole.    Antibiotics: Vancomycin and ceftriaxone  HPI: Dawn [name] is a 75 y.o. female with history of COPD, anxiety, who has had 2-3 weeks of feeling poorly.  Mainly with  diarrhea, malaise and brought in with AMS.  Was being treated for UTI without improvement and came in to ED. Work up revealed above and she required IV fluid resuscitation with prerenal azotemia and responded to fluids.  She was in SVT without chest pain and now on metoprolol.  No history of thyroid issues.  Poor intake likely for over 2 weeks.    Review of Systems:  Constitutional: positive for fatigue, malaise and anorexia or negative for weight loss Gastrointestinal: negative for vomiting Integument/breast: negative for rash Musculoskeletal: negative for myalgias and arthralgias All other systems reviewed and are negative   Past Medical History:  Diagnosis Date  . COPD (chronic obstructive pulmonary disease) ([place])   . History of anxiety   . History of depression   . Spinal stenosis   . UTI (urinary tract infection)     Social History  Substance Use Topics  . Smoking status: Former Research scientist (life sciences)  . Smokeless tobacco: Never Used  . Alcohol use No    Family History  Problem Relation Age of Onset  . Heart attack Mother   . Diabetes Father     No Known Allergies  Physical Exam: Constitutional: non-toxic  Vitals:   11/13/15 0733 11/13/15 1217  BP: 104/72 108/60  Pulse: 76 84  Resp: (!) 23 (!) 24  Temp: 98.3 F (36.8 C) 98.6 F (37 C)  EYES: anicteric ENMT: no thrush Cardiovascular: Cor Tachy Respiratory: CTA B; increased RR, distant sounds GI: Bowel sounds are normal, liver is not enlarged, spleen is not enlarged Musculoskeletal: no pedal edema noted Skin: negatives: no rash Neuro: grossly intact  Lab Results  Component Value Date   WBC 7.5 11/13/2015   HGB 8.1 (L) 11/13/2015   HCT 26.3 (L) 11/13/2015   MCV 89.5 11/13/2015   PLT 243 11/13/2015    Lab Results  Component Value Date   CREATININE 1.10 (H) 11/13/2015   BUN 13 11/13/2015   NA 139 11/13/2015   K 3.6 11/13/2015   CL 106 11/13/2015   CO2 25 11/13/2015    Lab Results  Component Value Date   ALT 8  (L) 11/11/2015   AST 12 (L) 11/11/2015   ALKPHOS 52 11/11/2015     Microbiology: Recent Results (from the past 240 hour(s))  Urine culture     Status: None   Collection Time: 11/10/15  2:30 AM  Result Value Ref Range Status   Specimen Description   Final    URINE, CLEAN CATCH Performed at [place] Requests NONE  Final   Culture NO GROWTH  Final   Report Status 11/11/2015 FINAL  Final  Culture, blood (Routine X 2) w Reflex to ID Panel     Status: None (Preliminary result)   Collection Time: 11/10/15  9:15 AM  Result Value Ref Range Status   Specimen Description BLOOD LEFT ARM  Final   Special Requests IN PEDIATRIC BOTTLE 2CC  Final   Culture NO GROWTH 3 DAYS  Final   Report Status PENDING  Incomplete  Culture, blood (Routine X 2) w Reflex to ID Panel     Status: Abnormal   Collection Time: 11/10/15  9:20 AM  Result Value Ref Range Status   Specimen Description BLOOD LEFT HAND  Final   Special Requests IN PEDIATRIC BOTTLE 2CC  Final   Culture  Setup Time   Final    AEROBIC BOTTLE ONLY GRAM POSITIVE COCCI IN CLUSTERS CRITICAL RESULT CALLED TO, READ BACK BY AND VERIFIED WITH: GREG ABBOTT,PHARMD '@0019'$  11/12/15 MKELLY,MLT    Culture (A)  Final    STAPHYLOCOCCUS SPECIES (COAGULASE NEGATIVE) THE SIGNIFICANCE OF ISOLATING THIS ORGANISM FROM A SINGLE VENIPUNCTURE CANNOT BE PREDICTED WITHOUT FURTHER CLINICAL AND CULTURE CORRELATION. SUSCEPTIBILITIES AVAILABLE ONLY ON REQUEST.    Report Status 11/13/2015 FINAL  Final  C difficile quick scan w PCR reflex     Status: None   Collection Time: 11/11/15  4:54 PM  Result Value Ref Range Status   C Diff antigen NEGATIVE NEGATIVE Final   C Diff toxin NEGATIVE NEGATIVE Final   C Diff interpretation No C. difficile detected.  Final  Gastrointestinal Panel by PCR , Stool     Status: None   Collection Time: 11/11/15  4:54 PM  Result Value Ref Range Status   Campylobacter species NOT DETECTED NOT DETECTED Final    Plesimonas shigelloides NOT DETECTED NOT DETECTED Final   Salmonella species NOT DETECTED NOT DETECTED Final   Yersinia enterocolitica NOT DETECTED NOT DETECTED Final   Vibrio species NOT DETECTED NOT DETECTED Final   Vibrio cholerae NOT DETECTED NOT DETECTED Final   Enteroaggregative E coli (EAEC) NOT DETECTED NOT DETECTED Final   Enteropathogenic E coli (EPEC) NOT DETECTED NOT DETECTED Final   Enterotoxigenic E coli (ETEC) NOT DETECTED NOT DETECTED Final   Shiga like toxin producing E coli (STEC) NOT DETECTED NOT  DETECTED Final   Shigella/Enteroinvasive E coli (EIEC) NOT DETECTED NOT DETECTED Final   Cryptosporidium NOT DETECTED NOT DETECTED Final   Cyclospora cayetanensis NOT DETECTED NOT DETECTED Final   Entamoeba histolytica NOT DETECTED NOT DETECTED Final   Giardia lamblia NOT DETECTED NOT DETECTED Final   Adenovirus F40/41 NOT DETECTED NOT DETECTED Final   Astrovirus NOT DETECTED NOT DETECTED Final   Norovirus GI/GII NOT DETECTED NOT DETECTED Final   Rotavirus A NOT DETECTED NOT DETECTED Final   Sapovirus (I, II, IV, and V) NOT DETECTED NOT DETECTED Final  MRSA PCR Screening     Status: None   Collection Time: 11/11/15 11:43 PM  Result Value Ref Range Status   MRSA by PCR NEGATIVE NEGATIVE Final    Comment:        The GeneXpert MRSA Assay (FDA approved for NASAL specimens only), is one component of a comprehensive MRSA colonization surveillance program. It is not intended to diagnose MRSA infection nor to guide or monitor treatment for MRSA infections.     Scharlene Gloss, [place] [facility] for Infectious Disease [place] [email or web address] [phone] pager  [phone] cell 11/13/2015, 3:43 PM

## 2015-11-13 NOTE — Care Management Important Message (Signed)
Important Message  Patient Details  Name: Dawn [name] MRN: [id number] Date of Birth: 1940-01-16   Medicare Important Message Given:  Yes    [name] Abena 11/13/2015, 10:34 AM

## 2015-11-13 NOTE — Progress Notes (Signed)
Patient complaint of frequent black, pasty stools, small in amount. Loperamide has been given twice this shift.  While patient was using the [facility], I assisted her in cleaning her bottom and noted that patient has a cystocele and she claimed that her MD suggested that it needs to be fixed but at that time, she did not want any surgery.

## 2015-11-13 NOTE — Progress Notes (Addendum)
    Subjective:  Denies CP or dyspnea   Objective:  Vitals:   11/13/15 0215 11/13/15 0348 11/13/15 0400 11/13/15 0733  BP:  113/60  104/72  Pulse:  94  76  Resp:  (!) 27 (!) 24 (!) 23  Temp: (!) 101.5 F (38.6 C) 99.6 F (37.6 C)  98.3 F (36.8 C)  TempSrc: Oral Oral  Oral  SpO2:  96%  100%  Weight:      Height:        Intake/Output from previous day:  Intake/Output Summary (Last 24 hours) at 11/13/15 0927 Last data filed at 11/13/15 0841  Gross per 24 hour  Intake             2920 ml  Output              125 ml  Net             2795 ml    Physical Exam: Physical exam: Well-developed chronically ill appearing in no acute distress.  Skin is warm and dry.  HEENT is normal.  Neck is supple.  Chest is clear to auscultation with normal expansion.  Cardiovascular exam is regular rate and tachycardic Abdominal exam nontender or distended. No masses palpated. Extremities show no edema. neuro grossly intact    Lab Results: Basic Metabolic Panel:  Recent Labs  11/12/15 0455 11/13/15 0129  NA 139 139  K 4.2 3.6  CL 108 106  CO2 25 25  GLUCOSE 92 125*  BUN 10 13  CREATININE 0.93 1.10*  CALCIUM 7.6* 7.3*  MG 1.5* 2.4  PHOS 1.9* 3.7   CBC:  Recent Labs  11/12/15 0455 11/13/15 0129  WBC 7.4 7.5  NEUTROABS 5.6 5.8  HGB 8.4* 8.1*  HCT 27.0* 26.3*  MCV 89.1 89.5  PLT 202 243   Cardiac Enzymes:  Recent Labs  11/11/15 1805 11/11/15 2245 11/12/15 0455  TROPONINI <0.03 <0.03 <0.03     Assessment/Plan:  1 Supraventricular tachycardia-telemetry has been reviewed personally. Patient continues to have ectopic atrial tachycardia intermittently but less frequent. Increase lopressor to 25 mg BID. Echo shows mildly reduced LV function; possibly tachycardia mediated; will need fu in the future.  2 sinus tachycardia-this is likely being driven by severe anemia, dehydration and hyperthyroidism. I would expect this to improve as this is treated.  3 severe  normocytic anemia- further evaluation per primary care. No evidence of GI bleeding.   4 Hyperthyroidism-management per primary care.  5 Urinary tract infection/dehydration-agree with IV fluids. Further treatment per primary service.  6 mild to moderate AI-plan fu echos in the future.  Kirk Ruths 11/13/2015, 9:27 AM

## 2015-11-14 DIAGNOSIS — N179 Acute kidney failure, unspecified: Secondary | ICD-10-CM

## 2015-11-14 LAB — RENAL FUNCTION PANEL
ANION GAP: 6 (ref 5–15)
Albumin: 1.6 g/dL — ABNORMAL LOW (ref 3.5–5.0)
BUN: 11 mg/dL (ref 6–20)
CO2: 24 mmol/L (ref 22–32)
Calcium: 7.3 mg/dL — ABNORMAL LOW (ref 8.9–10.3)
Chloride: 109 mmol/L (ref 101–111)
Creatinine, Ser: 0.9 mg/dL (ref 0.44–1.00)
GFR calc Af Amer: >60 mL/min (ref >60)
GFR calc non Af Amer: >60 mL/min (ref >60)
GLUCOSE: 86 mg/dL (ref 65–99)
POTASSIUM: 3 mmol/L — AB (ref 3.5–5.1)
Phosphorus: 3.2 mg/dL (ref 2.5–4.6)
Sodium: 139 mmol/L (ref 135–145)

## 2015-11-14 LAB — CBC WITH DIFFERENTIAL/PLATELET
BASOS ABS: 0 10*3/uL (ref 0.0–0.1)
Basophils Relative: 0 %
Eosinophils Absolute: 0.1 10*3/uL (ref 0.0–0.7)
Eosinophils Relative: 2 %
HEMATOCRIT: 23.4 % — AB (ref 36.0–46.0)
HEMOGLOBIN: 7.3 g/dL — AB (ref 12.0–15.0)
LYMPHS PCT: 24 %
Lymphs Abs: 1 10*3/uL (ref 0.7–4.0)
MCH: 28.5 pg (ref 26.0–34.0)
MCHC: 31.2 g/dL (ref 30.0–36.0)
MCV: 91.4 fL (ref 78.0–100.0)
MONO ABS: 0.5 10*3/uL (ref 0.1–1.0)
MONOS PCT: 12 %
NEUTROS ABS: 2.7 10*3/uL (ref 1.7–7.7)
NEUTROS PCT: 62 %
Platelets: 178 10*3/uL (ref 150–400)
RBC: 2.56 MIL/uL — ABNORMAL LOW (ref 3.87–5.11)
RDW: 16.1 % — AB (ref 11.5–15.5)
WBC: 4.3 10*3/uL (ref 4.0–10.5)

## 2015-11-14 LAB — BASIC METABOLIC PANEL
BUN: 11 mg/dL (ref 4–21)
CREATININE: 0.9 mg/dL (ref 0.5–1.1)
GLUCOSE: 86 mg/dL
POTASSIUM: 3 mmol/L — AB (ref 3.4–5.3)
Sodium: 139 mmol/L (ref 137–147)

## 2015-11-14 LAB — PROCALCITONIN: Procalcitonin: 0.28 ng/mL

## 2015-11-14 LAB — CBC AND DIFFERENTIAL
HCT: 23 % — AB (ref 36–46)
Hemoglobin: 7.3 g/dL — AB (ref 12.0–16.0)
Platelets: 178 10*3/uL (ref 150–399)
WBC: 4.3 10*3/mL

## 2015-11-14 LAB — MAGNESIUM: Magnesium: 1.9 mg/dL (ref 1.7–2.4)

## 2015-11-14 MED ORDER — METOPROLOL TARTRATE 25 MG PO TABS
25.0000 mg | ORAL_TABLET | Freq: Two times a day (BID) | ORAL | Status: DC
  Administered 2015-11-14: 25 mg via ORAL
  Filled 2015-11-14 (×2): qty 1

## 2015-11-14 MED ORDER — METOPROLOL TARTRATE 12.5 MG HALF TABLET
12.5000 mg | ORAL_TABLET | Freq: Once | ORAL | Status: AC
  Administered 2015-11-14: 12.5 mg via ORAL
  Filled 2015-11-14: qty 1

## 2015-11-14 MED ORDER — FUROSEMIDE 10 MG/ML IJ SOLN
20.0000 mg | Freq: Once | INTRAMUSCULAR | Status: AC
Start: 2015-11-14 — End: 2015-11-14
  Administered 2015-11-14: 20 mg via INTRAVENOUS
  Filled 2015-11-14: qty 2

## 2015-11-14 MED ORDER — POTASSIUM CHLORIDE CRYS ER 20 MEQ PO TBCR
40.0000 meq | EXTENDED_RELEASE_TABLET | Freq: Once | ORAL | Status: AC
  Administered 2015-11-14: 40 meq via ORAL
  Filled 2015-11-14: qty 2

## 2015-11-14 NOTE — Progress Notes (Signed)
Dr Jacalyn Lefevre rounding note reviewed. Telemetry reviewed, still with episodes of PACs and PVCs, short runs of atach. From note yesterday plan was to increase lopressor to '25mg'$  bid however does not appear dose was changed. Will increase today. Ectopic drive should decrease as her anemia and hyperthyroid are treated. We will monitor telemetry over weekend, call with questions.   Zandra Abts MD

## 2015-11-14 NOTE — Progress Notes (Addendum)
PROGRESS NOTE    Dawn [name]  GYF:[id number] DOB: Sep 07, 1940 DOA: 11/10/2015 PCP: [name], MD   Brief Narrative: Patient is a 75 y.o. female with past medical history of anxiety and depression, recently treated in the outpatient setting for a UTI with Macrobid, admitted for evaluation of acute encephalopathy. Per family, patient has been lethargic, with no significant oral intake for at least 2 weeks prior to this admission. She is also had some intermittent nausea,vomiting and diarrhea. She was found to be febrile, with low blood pressures and admitted to the hospital with empiric antibiotics and aggressive IV fluid resuscitation. She has now stabilized, however continues to have fever. Culture data and stool studies are unrevealing so far. Hospital course complicated by development of SVT , and new diagnosis of hyperthyroidism See below for further details  Subjective: Continues to improve-diarrhea slightly better. She is completely awake and alert and is wondering when she will be discharged from the hospital.Afebrile last night  Complains of mild shortness of breath on ambulation  Assessment/Plan: Principal Problem: Acute encephalopathy: Resolved.Likely metabolic encephalopathy in a setting of sepsis, dehydration and hyperthyroidism. Note, CT head on 10/31 negative for acute abnormalities.  Active Problems: ?Sepsis: Sepsis pathophysiology has resolved, she no longer is hypotensive. Foci of infection remains unknown-initially UTI was contemplated-however urine cultures are negative. One set of Blood cultures positive for coag negative staph-probably a contaminant-C. difficile PCR negative. She was on empiric Vanco and Rocephin, however after ID eval-all her Abx have been discontinued on 11/3-she is afebrile overnight.    SVT: Likely provoked by acute illness and newly diagnosed hyperthyroidism. Given improvement in her blood pressure, cardiology slowly optimizing dose of  metoprolol. . Echo with EF around 45-50%.   Acute kidney injury: Resolved. Likely mild prerenal azotemia setting of sepsis/UTI/poor oral intake.   Hyperthyroidism:  New diagnoses-likely the cause of diarrhea/hypovolumia and SVT-not sure if all of her clinical presentation can be attributed to apathetic hyperthyroidism. Started on Tapazole-and metoprolol. Will need endocrinology follow-up as outpatient  Diarrhea: Continues-? Related to Rocephin-or hyperthyroidism-stool studies negative-continue Imodium.Anticiapte will improve with treatment of underlying hyperthyroidism  Anemia: Appears to have chronic anemia at baseline-suspect acute drop in hemoglobin due to IV fluid dilution, acute illness. Transfused 1 unit of PRBC on 11/1-hemoglobin down to 7.3 today-will monitor for one more day-slightly SOB on ambulation-but has signs of mild vol overload (weight up to 136 lb, trace ankle edema)-if Hb decreases any further will transfuse. Follow Hb  Mild Vol Overload: likely 2/2 aggressive IVF resuscitation->5L + balance-weight is up to 136 lb-IVF stopped on 11/3-will give Lasix 20 mg IV today-BP although better is still soft.  Anxiety and depression: Continue Xanax and resume Wellbutrin.  History of chronic back pain: Have resumed narcotics as no longer with hypotension and lethargy.  Chronic hypotension: Per family (daughter) patient's blood pressure is mostly in the 91M systolic range-random cortisol levels appear appropriate- continue to monitor in stepdown  DVT Prophylaxis: Prophylactic Lovenox  Code Status: Full code   Family Communication: None at bedside this am-called daughter-unable to leave voicemail this am  Disposition Plan: Remain inpatient-home in 1-2 days-transfer to telemetry  Antimicrobial agents: Vanco 11/1>>11/3 Flagyl 11/1>>11/2 Rocephin 10/31>>11/3  Procedures: None  CONSULTS: PCCM Cards   Objective: Vitals:   11/14/15 0029 11/14/15 0353 11/14/15  0514 11/14/15 0800  BP: (!) 104/54 93/62 100/62 93/64  Pulse: 83 90 81 73  Resp: (!) 26 (!) 24 (!) 24 16  Temp: 98.8 F (37.1 C)  97 F (  36.1 C) 98.1 F (36.7 C)  TempSrc: Oral  Oral Oral  SpO2: 93% 96% 94% 97%  Weight:   61.7 kg (136 lb)   Height:        Intake/Output Summary (Last 24 hours) at 11/14/15 0847 Last data filed at 11/13/15 2130  Gross per 24 hour  Intake              540 ml  Output               50 ml  Net              490 ml   Filed Weights   11/13/15 1605 11/13/15 1700 11/14/15 0514  Weight: 61.8 kg (136 lb 3.2 oz) 60.6 kg (133 lb 8 oz) 61.7 kg (136 lb)    Examination:  General exam: Appears calm and comfortable  Respiratory system: Clear to auscultation. Respiratory effort normal. Cardiovascular system: S1 & S2 heard, RRR. No JVD, murmurs, rubs, gallops or clicks. No pedal edema. Gastrointestinal system: Abdomen is nondistended, soft and nontender. No organomegaly or masses felt. Central nervous system: Alert and oriented. No focal neurological deficits. Extremities: Symmetric 5 x 5 power-has + edema b/l Psychiatry: Judgement and insight appear normal. Mood & affect appropriate.     Data Reviewed: I have personally reviewed following labs and imaging studies  CBC:  Recent Labs Lab 11/10/15 0220 11/11/15 0734 11/11/15 1805 11/12/15 0455 11/13/15 0129 11/14/15 0556  WBC 9.8 7.1 8.6 7.4 7.5 4.3  NEUTROABS 7.5  --   --  5.6 5.8 2.7  HGB 8.5* 6.4* 8.2* 8.4* 8.1* 7.3*  HCT 28.2* 20.9* 26.2* 27.0* 26.3* 23.4*  MCV 92.8 91.7 90.0 89.1 89.5 91.4  PLT 351 153 198 202 243 967   Basic Metabolic Panel:  Recent Labs Lab 11/10/15 0220 11/11/15 0734 11/12/15 0455 11/13/15 0129 11/14/15 0556  NA 136 141 139 139 139  K 3.6 3.3* 4.2 3.6 3.0*  CL 102 114* 108 106 109  CO2 25 19* '25 25 24  '$ GLUCOSE 115* 95 92 125* 86  BUN '17 13 10 13 11  '$ CREATININE 1.56* 0.97 0.93 1.10* 0.90  CALCIUM 8.1* 7.0* 7.6* 7.3* 7.3*  MG  --   --  1.5* 2.4 1.9  PHOS  --    --  1.9* 3.7 3.2   GFR: Estimated Creatinine Clearance: 51.8 mL/min (by C-G formula based on SCr of 0.9 mg/dL). Liver Function Tests:  Recent Labs Lab 11/10/15 0220 11/11/15 0734 11/12/15 0455 11/13/15 0129 11/14/15 0556  AST 12* 12*  --   --   --   ALT 9* 8*  --   --   --   ALKPHOS 68 52  --   --   --   BILITOT 0.6 0.4  --   --   --   PROT 6.4* 4.6*  --   --   --   ALBUMIN 2.5* 1.7* 1.8* 1.7* 1.6*   No results for input(s): LIPASE, AMYLASE in the last 168 hours. No results for input(s): AMMONIA in the last 168 hours. Coagulation Profile: No results for input(s): INR, PROTIME in the last 168 hours. Cardiac Enzymes:  Recent Labs Lab 11/10/15 0220 11/11/15 1805 11/11/15 2245 11/12/15 0455  TROPONINI <0.03 <0.03 <0.03 <0.03   BNP (last 3 results) No results for input(s): PROBNP in the last 8760 hours. HbA1C: No results for input(s): HGBA1C in the last 72 hours. CBG: No results for input(s): GLUCAP in the last 168 hours. Lipid  Profile: No results for input(s): CHOL, HDL, LDLCALC, TRIG, CHOLHDL, LDLDIRECT in the last 72 hours. Thyroid Function Tests: No results for input(s): TSH, T4TOTAL, FREET4, T3FREE, THYROIDAB in the last 72 hours. Anemia Panel: No results for input(s): VITAMINB12, FOLATE, FERRITIN, TIBC, IRON, RETICCTPCT in the last 72 hours. Sepsis Labs:  Recent Labs Lab 11/10/15 0231  11/11/15 0734 11/11/15 1805 11/12/15 0455 11/13/15 0129 11/14/15 0556  PROCALCITON  --   < >  --  0.59 0.64 0.38 0.28  LATICACIDVEN 1.30  --  0.8  --   --   --   --   < > = values in this interval not displayed.  Recent Results (from the past 240 hour(s))  Urine culture     Status: None   Collection Time: 11/10/15  2:30 AM  Result Value Ref Range Status   Specimen Description   Final    URINE, CLEAN CATCH Performed at [facility] Requests NONE  Final   Culture NO GROWTH  Final   Report Status 11/11/2015 FINAL  Final  Culture, blood (Routine X  2) w Reflex to ID Panel     Status: None (Preliminary result)   Collection Time: 11/10/15  9:15 AM  Result Value Ref Range Status   Specimen Description BLOOD LEFT ARM  Final   Special Requests IN PEDIATRIC BOTTLE 2CC  Final   Culture NO GROWTH 3 DAYS  Final   Report Status PENDING  Incomplete  Culture, blood (Routine X 2) w Reflex to ID Panel     Status: Abnormal   Collection Time: 11/10/15  9:20 AM  Result Value Ref Range Status   Specimen Description BLOOD LEFT HAND  Final   Special Requests IN PEDIATRIC BOTTLE 2CC  Final   Culture  Setup Time   Final    AEROBIC BOTTLE ONLY GRAM POSITIVE COCCI IN CLUSTERS CRITICAL RESULT CALLED TO, READ BACK BY AND VERIFIED WITH: GREG ABBOTT,PHARMD '@0019'$  11/12/15 MKELLY,MLT    Culture (A)  Final    STAPHYLOCOCCUS SPECIES (COAGULASE NEGATIVE) THE SIGNIFICANCE OF ISOLATING THIS ORGANISM FROM A SINGLE VENIPUNCTURE CANNOT BE PREDICTED WITHOUT FURTHER CLINICAL AND CULTURE CORRELATION. SUSCEPTIBILITIES AVAILABLE ONLY ON REQUEST.    Report Status 11/13/2015 FINAL  Final  C difficile quick scan w PCR reflex     Status: None   Collection Time: 11/11/15  4:54 PM  Result Value Ref Range Status   C Diff antigen NEGATIVE NEGATIVE Final   C Diff toxin NEGATIVE NEGATIVE Final   C Diff interpretation No C. difficile detected.  Final  Gastrointestinal Panel by PCR , Stool     Status: None   Collection Time: 11/11/15  4:54 PM  Result Value Ref Range Status   Campylobacter species NOT DETECTED NOT DETECTED Final   Plesimonas shigelloides NOT DETECTED NOT DETECTED Final   Salmonella species NOT DETECTED NOT DETECTED Final   Yersinia enterocolitica NOT DETECTED NOT DETECTED Final   Vibrio species NOT DETECTED NOT DETECTED Final   Vibrio cholerae NOT DETECTED NOT DETECTED Final   Enteroaggregative E coli (EAEC) NOT DETECTED NOT DETECTED Final   Enteropathogenic E coli (EPEC) NOT DETECTED NOT DETECTED Final   Enterotoxigenic E coli (ETEC) NOT DETECTED NOT DETECTED  Final   Shiga like toxin producing E coli (STEC) NOT DETECTED NOT DETECTED Final   Shigella/Enteroinvasive E coli (EIEC) NOT DETECTED NOT DETECTED Final   Cryptosporidium NOT DETECTED NOT DETECTED Final   Cyclospora cayetanensis NOT DETECTED NOT DETECTED Final  Entamoeba histolytica NOT DETECTED NOT DETECTED Final   Giardia lamblia NOT DETECTED NOT DETECTED Final   Adenovirus F40/41 NOT DETECTED NOT DETECTED Final   Astrovirus NOT DETECTED NOT DETECTED Final   Norovirus GI/GII NOT DETECTED NOT DETECTED Final   Rotavirus A NOT DETECTED NOT DETECTED Final   Sapovirus (I, II, IV, and V) NOT DETECTED NOT DETECTED Final  MRSA PCR Screening     Status: None   Collection Time: 11/11/15 11:43 PM  Result Value Ref Range Status   MRSA by PCR NEGATIVE NEGATIVE Final    Comment:        The GeneXpert MRSA Assay (FDA approved for NASAL specimens only), is one component of a comprehensive MRSA colonization surveillance program. It is not intended to diagnose MRSA infection nor to guide or monitor treatment for MRSA infections.          Radiology Studies: No results found.      Scheduled Meds: . ALPRAZolam  0.5 mg Oral TID  . buPROPion  150 mg Oral Daily  . enoxaparin (LOVENOX) injection  40 mg Subcutaneous Q24H  . feeding supplement  1 Container Oral TID BM  . furosemide  20 mg Intravenous Once  . gabapentin  600 mg Oral TID  . methimazole  5 mg Oral BID  . metoprolol tartrate  12.5 mg Oral BID  . oxyCODONE  5 mg Oral Q8H   Continuous Infusions: . lactated ringers 10 mL/hr at 11/13/15 0232     LOS: 4 days    Time spent: 38  [place], [facility]   If 7PM-7AM, please contact night-coverage [email or web address] Password [facility] 11/14/2015, 8:47 AM

## 2015-11-14 NOTE — Progress Notes (Addendum)
Report received from [place] and patient admitted to [place] [facility]. Provider notified of patient's arrival to unit. Patient alert and oriented and has no complaints at this time. Skin check completed with Sarah, RN and skin is WNL other than bruising on both arms. Telemetry initiated and verified, NSR. Patient oriented to room and call bell. Patient verified all of her belongings, two bags of clothing, are present. Will continue to monitor.  Wyonia Hough

## 2015-11-14 NOTE — Evaluation (Signed)
Physical Therapy Evaluation Patient Details Name: Dawn [name] MRN: [id number] DOB: 1940-04-21 Today's Date: 11/14/2015   History of Present Illness  Pt is a 75 y/o female admitted secondary to AMS. PMH including but not limited to OA, anxiety and depression.  Clinical Impression  Pt presented supine in bed with HOB elevated, initially asleep but easily aroused and willing to participate in therapy session. Prior to admission, pt stated that she was very independent with all functional mobility and ADLs. Pt required min guard for safety during all functional mobility. She was able to ambulate 20' x2 without an AD with min guard for safety. Pt would continue to benefit from skilled physical therapy services at this time while admitted to address her below listed limitations in order to improve her overall safety and independence with functional mobility.      Follow Up Recommendations Supervision for mobility/OOB    Equipment Recommendations  None recommended by PT    Recommendations for Other Services       Precautions / Restrictions Precautions Precautions: Fall Restrictions Weight Bearing Restrictions: No      Mobility  Bed Mobility Overal bed mobility: Needs Assistance Bed Mobility: Supine to Sit     Supine to sit: Min guard;HOB elevated     General bed mobility comments: pt required increased time, use of bed rails and min guard for safety  Transfers Overall transfer level: Needs assistance Equipment used: None Transfers: Sit to/from Stand Sit to Stand: Min guard         General transfer comment: min guard for safety  Ambulation/Gait Ambulation/Gait assistance: Min guard Ambulation Distance (Feet): 20 Feet (20' x2 with sitting rest break on toilet) Assistive device: None Gait Pattern/deviations: Step-through pattern;Decreased stride length Gait velocity: decreased   General Gait Details: upon standing from bed, no LOB or instability noted. However,  after pt used the bathroom and standing up front toilet, pt was mildly unsteady and required bilateral UEs on wall to steady herself.   Stairs            Wheelchair Mobility    Modified Rankin (Stroke Patients Only)       Balance Overall balance assessment: Needs assistance Sitting-balance support: Feet supported;No upper extremity supported Sitting balance-Leahy Scale: Good     Standing balance support: During functional activity;No upper extremity supported Standing balance-Leahy Scale: Fair                               Pertinent Vitals/Pain Pain Assessment: No/denies pain    Home Living Family/patient expects to be discharged to:: Private residence Living Arrangements: Children Available Help at Discharge: Family;Available PRN/intermittently Type of Home: House Home Access: Stairs to enter   [employer] of Steps: 3 Home Layout: One level Home Equipment: Walker - 2 wheels      Prior Function Level of Independence: Independent               Hand Dominance        Extremity/Trunk Assessment   Upper Extremity Assessment: Overall WFL for tasks assessed           Lower Extremity Assessment: Overall WFL for tasks assessed         Communication   Communication: No difficulties  Cognition Arousal/Alertness: Lethargic Behavior During Therapy: WFL for tasks assessed/performed Overall Cognitive Status: No family/caregiver present to determine baseline cognitive functioning Area of Impairment: Orientation;Safety/judgement Orientation Level: Disoriented to;Time   Memory: Decreased  short-term memory   Safety/Judgement: Decreased awareness of safety          General Comments      Exercises     Assessment/Plan    PT Assessment Patient needs continued PT services  PT Problem List Decreased activity tolerance;Decreased balance;Decreased mobility;Decreased coordination;Decreased safety awareness          PT  Treatment Interventions Gait training;Stair training;Functional mobility training;Therapeutic activities;Therapeutic exercise;Neuromuscular re-education;Balance training;DME instruction;Patient/family education    PT Goals (Current goals can be found in the Care Plan section)  Acute Rehab PT Goals Patient Stated Goal: return home PT Goal Formulation: With patient Time For Goal Achievement: 11/21/15 Potential to Achieve Goals: Good    Frequency Min 3X/week   Barriers to discharge        Co-evaluation               End of Session Equipment Utilized During Treatment: Gait belt Activity Tolerance: Patient limited by fatigue;Patient limited by lethargy Patient left: in chair;with call bell/phone within reach;with chair alarm set Nurse Communication: Mobility status         Time: 2446-2863 PT Time Calculation (min) (ACUTE ONLY): 20 min   Charges:   PT Evaluation $PT Eval Moderate Complexity: 1 Procedure     PT G CodesClearnce Sorrel Chrystopher Stangl 11/14/2015, 5:28 PM Sherie Don, [place], DPT [phone]

## 2015-11-15 DIAGNOSIS — K591 Functional diarrhea: Secondary | ICD-10-CM

## 2015-11-15 DIAGNOSIS — F418 Other specified anxiety disorders: Secondary | ICD-10-CM

## 2015-11-15 DIAGNOSIS — I491 Atrial premature depolarization: Secondary | ICD-10-CM

## 2015-11-15 LAB — CULTURE, BLOOD (ROUTINE X 2): Culture: NO GROWTH

## 2015-11-15 LAB — CBC AND DIFFERENTIAL
HCT: 27 % — AB (ref 36–46)
HEMOGLOBIN: 8 g/dL — AB (ref 12.0–16.0)
Platelets: 214 10*3/uL (ref 150–399)
WBC: 5.1 10^3/mL

## 2015-11-15 LAB — NOROVIRUS GROUP 1 & 2 BY PCR, STOOL
NOROVIRUS 2 BY PCR: NEGATIVE
Norovirus 1 by PCR: NEGATIVE

## 2015-11-15 LAB — BASIC METABOLIC PANEL
ANION GAP: 6 (ref 5–15)
BUN: 11 mg/dL (ref 6–20)
CHLORIDE: 105 mmol/L (ref 101–111)
CO2: 27 mmol/L (ref 22–32)
Calcium: 7.8 mg/dL — ABNORMAL LOW (ref 8.9–10.3)
Creatinine, Ser: 1.18 mg/dL — ABNORMAL HIGH (ref 0.44–1.00)
GFR calc non Af Amer: 44 mL/min — ABNORMAL LOW (ref >60)
GFR, EST AFRICAN AMERICAN: 51 mL/min — AB (ref >60)
Glucose, Bld: 97 mg/dL (ref 65–99)
POTASSIUM: 4 mmol/L (ref 3.5–5.1)
SODIUM: 138 mmol/L (ref 135–145)

## 2015-11-15 LAB — CBC
HCT: 26.8 % — ABNORMAL LOW (ref 36.0–46.0)
HEMOGLOBIN: 8 g/dL — AB (ref 12.0–15.0)
MCH: 27.7 pg (ref 26.0–34.0)
MCHC: 29.9 g/dL — ABNORMAL LOW (ref 30.0–36.0)
MCV: 92.7 fL (ref 78.0–100.0)
Platelets: 214 10*3/uL (ref 150–400)
RBC: 2.89 MIL/uL — AB (ref 3.87–5.11)
RDW: 16.1 % — ABNORMAL HIGH (ref 11.5–15.5)
WBC: 5.1 10*3/uL (ref 4.0–10.5)

## 2015-11-15 MED ORDER — SODIUM CHLORIDE 0.9 % IV BOLUS (SEPSIS)
250.0000 mL | Freq: Once | INTRAVENOUS | Status: AC
  Administered 2015-11-15: 250 mL via INTRAVENOUS

## 2015-11-15 MED ORDER — METOPROLOL TARTRATE 25 MG PO TABS
37.5000 mg | ORAL_TABLET | Freq: Two times a day (BID) | ORAL | Status: DC
  Administered 2015-11-15 – 2015-11-17 (×4): 37.5 mg via ORAL
  Filled 2015-11-15 (×7): qty 1

## 2015-11-15 MED ORDER — LOPERAMIDE HCL 2 MG PO CAPS
2.0000 mg | ORAL_CAPSULE | Freq: Two times a day (BID) | ORAL | Status: DC
  Administered 2015-11-15 – 2015-11-18 (×6): 2 mg via ORAL
  Filled 2015-11-15 (×7): qty 1

## 2015-11-15 MED ORDER — METHIMAZOLE 10 MG PO TABS
10.0000 mg | ORAL_TABLET | Freq: Two times a day (BID) | ORAL | Status: DC
Start: 2015-11-15 — End: 2015-11-20
  Administered 2015-11-15 – 2015-11-20 (×11): 10 mg via ORAL
  Filled 2015-11-15 (×12): qty 1

## 2015-11-15 MED ORDER — FUROSEMIDE 10 MG/ML IJ SOLN
20.0000 mg | Freq: Once | INTRAMUSCULAR | Status: DC
  Filled 2015-11-15 (×2): qty 2

## 2015-11-15 MED ORDER — RISAQUAD PO CAPS
1.0000 | ORAL_CAPSULE | Freq: Every day | ORAL | Status: DC
  Administered 2015-11-15 – 2015-11-20 (×6): 1 via ORAL
  Filled 2015-11-15 (×6): qty 1

## 2015-11-15 NOTE — Progress Notes (Signed)
Dr Jacalyn Lefevre rounding note reviewed. Telemetry reviewed, still with episodes of PACs and PVCs, short runs of atach though improving. Yesterday we increased lopressor to '25mg'$  bid. Increase to 37.'5mg'$  bid.  Ectopic drive should decrease as her anemia,sepsis, and hyperthyroid are treated. We will monitor telemetry over weekend, call with questions.   Dawn Abts MD

## 2015-11-15 NOTE — Progress Notes (Signed)
Pt temp 102.8, Tylenol given, Schorr, NP notified. Order for bolus 250 ml placed.

## 2015-11-15 NOTE — Progress Notes (Signed)
At 2335, pt temperature 100.50F, bolus completed, Schorr, NP notified about bolus completion and VS. Pt denies pain and discomfort, will continue to monitor.

## 2015-11-15 NOTE — Progress Notes (Signed)
PROGRESS NOTE    Dawn [name]  XBM:[id number] DOB: September 20, 1940 DOA: 11/10/2015 PCP: [name], MD   Brief Narrative: Patient is a 74 y.o. female with past medical history of anxiety and depression, recently treated in the outpatient setting for a UTI with Macrobid, admitted for evaluation of acute encephalopathy. Per family, patient has been lethargic, with no significant oral intake for at least 2 weeks prior to this admission. She is also had some intermittent nausea,vomiting and diarrhea. She was found to be febrile, with low blood pressures and admitted to the hospital with empiric antibiotics and aggressive IV fluid resuscitation. She has now stabilized, however continues to have fever. Culture data and stool studies are unrevealing so far. Hospital course complicated by development of SVT , and new diagnosis of hyperthyroidism See below for further details  Subjective: Continues to have 2-3 stools everyday. Shortness of breath is markedly better. Overall she has significantly improved, she remains awake and alert  No chest pain.  Assessment/Plan: Principal Problem: Acute encephalopathy: Resolved.Likely metabolic encephalopathy in a setting of sepsis, dehydration and hyperthyroidism. Note, CT head on 10/31 negative for acute abnormalities.  Active Problems: Hypotension: Resolved, etiology initially thought to be sepsis, but could have been from hypovolemia from diarrhea. Follow.  ?Sepsis: Fever and hypotension during the early part of her hospital stay-initially thought to be sepsis due to UTI or diarrhea-and treated with vancomycin and Rocephin. Culture data are negative so far, in retrospect, she could have had diarrhea causing hypotension, and fever could be from hypothyroidism. She was seen by infectious disease, all antibiotics have been discontinued on 11/3, she remains afebrile. Plans are to continue to monitor off antibiotics.   SVT: Likely provoked by acute  illness and newly diagnosed hyperthyroidism. Given improvement in her blood pressure, cardiology slowly optimizing dose of metoprolol. . Echo with EF around 45-50%.   Acute kidney injury: Resolved. Likely mild prerenal azotemia setting of sepsis/UTI/poor oral intake.   Hyperthyroidism:  New diagnoses-likely the cause of diarrhea/hypovolumia and SVT-not sure if all of her clinical presentation can be attributed to apathetic hyperthyroidism. Started on Tapazole-and metoprolol. Will need endocrinology follow-up as outpatient  Diarrhea: Continues-probably related to hyperthyroidism. Stool studies looking C. difficile and GI pathogen panel are negative. Will schedule Imodium and add a probiotic. Currently started on Tapazole-hopefully diarrhea was slowly abate with continued treatment of underlying hyperthyroidism.   Anemia: Appears to have chronic anemia at baseline-suspect acute drop in hemoglobin due to IV fluid dilution, acute illness. Transfused 1 unit of PRBC on 11/1-hemoglobin stable at 8.0 today. Her shortness of breath is much better after 1 dose of Lasix yesterday. Continue to follow hemoglobin periodically.   Mild Vol Overload: likely 2/2 aggressive IVF resuscitation->5L + balance-weight is up to 136 lb-IVF stopped on 11/3-will give Lasix 20 mg IV today as well-NO signs of congestive heart failure on exam.  Anxiety and depression: Continue Xanax and resume Wellbutrin.  History of chronic back pain: Have resumed narcotics as no longer with hypotension and lethargy.  Chronic hypotension: Per family (daughter) patient's blood pressure is mostly in the 02V systolic range-random cortisol levels appear appropriate-  DVT Prophylaxis: Prophylactic Lovenox  Code Status: Full code   Family Communication: Spoke to patient's daughter on 11/4-none at bedside this morning.  Disposition Plan: Remain inpatient-home in 1-2 days-transfer to telemetry  Antimicrobial agents: Vanco  11/1>>11/3 Flagyl 11/1>>11/2 Rocephin 10/31>>11/3  Procedures: None  CONSULTS: PCCM Cards   Objective: Vitals:   11/14/15 2145 11/15/15 0105 11/15/15 0333 11/15/15  1105  BP:   114/61 (!) 92/44  Pulse: 91  96 82  Resp:   20   Temp:   98.2 F (36.8 C)   TempSrc:      SpO2:   96%   Weight:  60.9 kg (134 lb 4.8 oz)    Height:        Intake/Output Summary (Last 24 hours) at 11/15/15 1142 Last data filed at 11/15/15 0600  Gross per 24 hour  Intake              240 ml  Output              350 ml  Net             -110 ml   Filed Weights   11/14/15 0514 11/14/15 1423 11/15/15 0105  Weight: 61.7 kg (136 lb) 60.9 kg (134 lb 4.8 oz) 60.9 kg (134 lb 4.8 oz)    Examination:  General exam: Appears calm and comfortable  Respiratory system: Clear to auscultation. Respiratory effort normal. Cardiovascular system: S1 & S2 heard, RRR. No JVD, murmurs, rubs, gallops or clicks. No pedal edema. Gastrointestinal system: Abdomen is nondistended, soft and nontender. No organomegaly or masses felt. Central nervous system: Alert and oriented. No focal neurological deficits. Extremities: Symmetric 5 x 5 power-has trace edema b/l Psychiatry: Judgement and insight appear normal. Mood & affect appropriate.     Data Reviewed: I have personally reviewed following labs and imaging studies  CBC:  Recent Labs Lab 11/10/15 0220  11/11/15 1805 11/12/15 0455 11/13/15 0129 11/14/15 0556 11/15/15 0541  WBC 9.8  < > 8.6 7.4 7.5 4.3 5.1  NEUTROABS 7.5  --   --  5.6 5.8 2.7  --   HGB 8.5*  < > 8.2* 8.4* 8.1* 7.3* 8.0*  HCT 28.2*  < > 26.2* 27.0* 26.3* 23.4* 26.8*  MCV 92.8  < > 90.0 89.1 89.5 91.4 92.7  PLT 351  < > 198 202 243 178 214  < > = values in this interval not displayed. Basic Metabolic Panel:  Recent Labs Lab 11/11/15 0734 11/12/15 0455 11/13/15 0129 11/14/15 0556 11/15/15 0541  NA 141 139 139 139 138  K 3.3* 4.2 3.6 3.0* 4.0  CL 114* 108 106 109 105  CO2 19* '25 25 24  27  '$ GLUCOSE 95 92 125* 86 97  BUN '13 10 13 11 11  '$ CREATININE 0.97 0.93 1.10* 0.90 1.18*  CALCIUM 7.0* 7.6* 7.3* 7.3* 7.8*  MG  --  1.5* 2.4 1.9  --   PHOS  --  1.9* 3.7 3.2  --    GFR: Estimated Creatinine Clearance: 39 mL/min (by C-G formula based on SCr of 1.18 mg/dL (H)). Liver Function Tests:  Recent Labs Lab 11/10/15 0220 11/11/15 0734 11/12/15 0455 11/13/15 0129 11/14/15 0556  AST 12* 12*  --   --   --   ALT 9* 8*  --   --   --   ALKPHOS 68 52  --   --   --   BILITOT 0.6 0.4  --   --   --   PROT 6.4* 4.6*  --   --   --   ALBUMIN 2.5* 1.7* 1.8* 1.7* 1.6*   No results for input(s): LIPASE, AMYLASE in the last 168 hours. No results for input(s): AMMONIA in the last 168 hours. Coagulation Profile: No results for input(s): INR, PROTIME in the last 168 hours. Cardiac Enzymes:  Recent Labs Lab 11/10/15 0220  11/11/15 1805 11/11/15 2245 11/12/15 0455  TROPONINI <0.03 <0.03 <0.03 <0.03   BNP (last 3 results) No results for input(s): PROBNP in the last 8760 hours. HbA1C: No results for input(s): HGBA1C in the last 72 hours. CBG: No results for input(s): GLUCAP in the last 168 hours. Lipid Profile: No results for input(s): CHOL, HDL, LDLCALC, TRIG, CHOLHDL, LDLDIRECT in the last 72 hours. Thyroid Function Tests: No results for input(s): TSH, T4TOTAL, FREET4, T3FREE, THYROIDAB in the last 72 hours. Anemia Panel: No results for input(s): VITAMINB12, FOLATE, FERRITIN, TIBC, IRON, RETICCTPCT in the last 72 hours. Sepsis Labs:  Recent Labs Lab 11/10/15 0231  11/11/15 0734 11/11/15 1805 11/12/15 0455 11/13/15 0129 11/14/15 0556  PROCALCITON  --   < >  --  0.59 0.64 0.38 0.28  LATICACIDVEN 1.30  --  0.8  --   --   --   --   < > = values in this interval not displayed.  Recent Results (from the past 240 hour(s))  Urine culture     Status: None   Collection Time: 11/10/15  2:30 AM  Result Value Ref Range Status   Specimen Description   Final    URINE, CLEAN  CATCH Performed at [facility] Requests NONE  Final   Culture NO GROWTH  Final   Report Status 11/11/2015 FINAL  Final  Culture, blood (Routine X 2) w Reflex to ID Panel     Status: None   Collection Time: 11/10/15  9:15 AM  Result Value Ref Range Status   Specimen Description BLOOD LEFT ARM  Final   Special Requests IN PEDIATRIC BOTTLE 2CC  Final   Culture NO GROWTH 5 DAYS  Final   Report Status 11/15/2015 FINAL  Final  Culture, blood (Routine X 2) w Reflex to ID Panel     Status: Abnormal   Collection Time: 11/10/15  9:20 AM  Result Value Ref Range Status   Specimen Description BLOOD LEFT HAND  Final   Special Requests IN PEDIATRIC BOTTLE 2CC  Final   Culture  Setup Time   Final    AEROBIC BOTTLE ONLY GRAM POSITIVE COCCI IN CLUSTERS CRITICAL RESULT CALLED TO, READ BACK BY AND VERIFIED WITH: GREG ABBOTT,PHARMD '@0019'$  11/12/15 MKELLY,MLT    Culture (A)  Final    STAPHYLOCOCCUS SPECIES (COAGULASE NEGATIVE) THE SIGNIFICANCE OF ISOLATING THIS ORGANISM FROM A SINGLE VENIPUNCTURE CANNOT BE PREDICTED WITHOUT FURTHER CLINICAL AND CULTURE CORRELATION. SUSCEPTIBILITIES AVAILABLE ONLY ON REQUEST.    Report Status 11/13/2015 FINAL  Final  C difficile quick scan w PCR reflex     Status: None   Collection Time: 11/11/15  4:54 PM  Result Value Ref Range Status   C Diff antigen NEGATIVE NEGATIVE Final   C Diff toxin NEGATIVE NEGATIVE Final   C Diff interpretation No C. difficile detected.  Final  Gastrointestinal Panel by PCR , Stool     Status: None   Collection Time: 11/11/15  4:54 PM  Result Value Ref Range Status   Campylobacter species NOT DETECTED NOT DETECTED Final   Plesimonas shigelloides NOT DETECTED NOT DETECTED Final   Salmonella species NOT DETECTED NOT DETECTED Final   Yersinia enterocolitica NOT DETECTED NOT DETECTED Final   Vibrio species NOT DETECTED NOT DETECTED Final   Vibrio cholerae NOT DETECTED NOT DETECTED Final   Enteroaggregative E coli (EAEC)  NOT DETECTED NOT DETECTED Final   Enteropathogenic E coli (EPEC) NOT DETECTED NOT DETECTED Final   Enterotoxigenic E  coli (ETEC) NOT DETECTED NOT DETECTED Final   Shiga like toxin producing E coli (STEC) NOT DETECTED NOT DETECTED Final   Shigella/Enteroinvasive E coli (EIEC) NOT DETECTED NOT DETECTED Final   Cryptosporidium NOT DETECTED NOT DETECTED Final   Cyclospora cayetanensis NOT DETECTED NOT DETECTED Final   Entamoeba histolytica NOT DETECTED NOT DETECTED Final   Giardia lamblia NOT DETECTED NOT DETECTED Final   Adenovirus F40/41 NOT DETECTED NOT DETECTED Final   Astrovirus NOT DETECTED NOT DETECTED Final   Norovirus GI/GII NOT DETECTED NOT DETECTED Final   Rotavirus A NOT DETECTED NOT DETECTED Final   Sapovirus (I, II, IV, and V) NOT DETECTED NOT DETECTED Final  MRSA PCR Screening     Status: None   Collection Time: 11/11/15 11:43 PM  Result Value Ref Range Status   MRSA by PCR NEGATIVE NEGATIVE Final    Comment:        The GeneXpert MRSA Assay (FDA approved for NASAL specimens only), is one component of a comprehensive MRSA colonization surveillance program. It is not intended to diagnose MRSA infection nor to guide or monitor treatment for MRSA infections.          Radiology Studies: No results found.      Scheduled Meds: . acidophilus  1 capsule Oral Daily  . ALPRAZolam  0.5 mg Oral TID  . buPROPion  150 mg Oral Daily  . enoxaparin (LOVENOX) injection  40 mg Subcutaneous Q24H  . feeding supplement  1 Container Oral TID BM  . gabapentin  600 mg Oral TID  . loperamide  2 mg Oral BID  . methimazole  10 mg Oral BID  . metoprolol tartrate  37.5 mg Oral BID  . oxyCODONE  5 mg Oral Q8H   Continuous Infusions:    LOS: 5 days    Time spent: [place], [facility]   If 7PM-7AM, please contact night-coverage [email or web address] Password [email or web address] 11/15/2015, 11:42 AM

## 2015-11-15 NOTE — Progress Notes (Signed)
[facility] for Infectious Disease    Date of Admission:  11/10/2015   Total days of antibiotics 4        - not currently on abtx   ID: Dawn [name] is a 75 y.o. female with  history of COPD, anxiety, who has had 2-3 weeks of feeling poorly.  Mainly with diarrhea, malaise and brought in with AMS.  Was being treated for UTI without improvement and came in to ED. Work up revealed above and she required IV fluid resuscitation with prerenal azotemia and responded to fluids.  She was in SVT without chest pain and now on metoprolol.  No history of thyroid issues.  Poor intake likely for over 2 weeks.  Principal Problem:   Acute kidney injury ([place]) Active Problems:   Acute delirium   Normocytic anemia   Anxiety and depression   Chronic pain syndrome   Recurrent UTI   Acute respiratory failure with hypoxia (HCC)   COPD (chronic obstructive pulmonary disease) (HCC)   Acute hyperglycemia   CKD (chronic kidney disease) stage 3, GFR 30-59 ml/min   Narrow complex tachycardia (HCC)   HTN (hypertension)   Anemia   Delirium   Hypoxemia   Tachycardia   Generalized weakness   Septic shock (HCC)   Acute encephalopathy   Hypokalemia   Sepsis (HCC)   Hyperthyroidism   Hypotension    Subjective:  she has roughly 2 loose bowel movements a day. No abdominal cramping. No fevers in the last 72hr, denies symptoms of palpitations  Interval events: tachycardia on telemetry  Medications:  . ALPRAZolam  0.5 mg Oral TID  . buPROPion  150 mg Oral Daily  . enoxaparin (LOVENOX) injection  40 mg Subcutaneous Q24H  . feeding supplement  1 Container Oral TID BM  . gabapentin  600 mg Oral TID  . methimazole  10 mg Oral BID  . metoprolol tartrate  37.5 mg Oral BID  . oxyCODONE  5 mg Oral Q8H    Objective: Vital signs in last 24 hours: Temp:  [98.2 F (36.8 C)-98.4 F (36.9 C)] 98.2 F (36.8 C) (11/05 0333) Pulse Rate:  [74-96] 82 (11/05 1105) Resp:  [18-20] 20 (11/05 0333) BP:  (92-114)/(44-62) 92/44 (11/05 1105) SpO2:  [96 %-99 %] 96 % (11/05 0333) Weight:  [134 lb 4.8 oz (60.9 kg)] 134 lb 4.8 oz (60.9 kg) (11/05 0105) Physical Exam  Constitutional:  oriented to person, place, and time. appears well-developed and well-nourished. No distress.  HENT: [place]/AT, PERRLA, no scleral icterus Mouth/Throat: Oropharynx is clear and moist. No oropharyngeal exudate.  Cardiovascular: Normal rate, regular rhythm and normal heart sounds. Exam reveals no gallop and no friction rub.  No murmur heard.  Pulmonary/Chest: Effort normal and breath sounds normal. No respiratory distress.  has no wheezes.  Neck = supple, no nuchal rigidity Abdominal: Soft. Bowel sounds are normal.  exhibits no distension. There is no tenderness.  Lymphadenopathy: no cervical adenopathy. No axillary adenopathy Neurological: alert and oriented to person, place, and time.  Skin: Skin is warm and dry. No rash noted. No erythema.  Psychiatric: a normal mood and affect.  behavior is normal.   Lab Results  Recent Labs  11/14/15 0556 11/15/15 0541  WBC 4.3 5.1  HGB 7.3* 8.0*  HCT 23.4* 26.8*  NA 139 138  K 3.0* 4.0  CL 109 105  CO2 24 27  BUN 11 11  CREATININE 0.90 1.18*   Liver Panel  Recent Labs  11/13/15 0129 11/14/15  8546  ALBUMIN 1.7* 1.6*    Microbiology: 10/31 blood cx 1 of 4 bottles CoNS Studies/Results: No results found.   Assessment/Plan: Diarrhea = started having diarrhea that is worsening, I do not think it is infectious, since previously tested and cdiff ruled out. Consider scheduling her loperamide. She was given a dose this morning  Fever = now resolved, no need for abtx  Tachycardia = thought to be due to [facility] and short runs of atrial tachycardia, currently on metoprolol for which cardiology is providing recs  Will sign off.  Baxter Flattery [facility] for Infectious Diseases Cell: [phone] Pager: [phone]  11/15/2015, 11:28 AM

## 2015-11-16 ENCOUNTER — Inpatient Hospital Stay (HOSPITAL_COMMUNITY): Payer: Medicare Other

## 2015-11-16 DIAGNOSIS — Z79899 Other long term (current) drug therapy: Secondary | ICD-10-CM

## 2015-11-16 DIAGNOSIS — E0590 Thyrotoxicosis, unspecified without thyrotoxic crisis or storm: Secondary | ICD-10-CM

## 2015-11-16 DIAGNOSIS — R000 Tachycardia, unspecified: Secondary | ICD-10-CM

## 2015-11-16 LAB — CBC
HCT: 27.1 % — ABNORMAL LOW (ref 36.0–46.0)
Hemoglobin: 8.1 g/dL — ABNORMAL LOW (ref 12.0–15.0)
MCH: 28 pg (ref 26.0–34.0)
MCHC: 29.9 g/dL — AB (ref 30.0–36.0)
MCV: 93.8 fL (ref 78.0–100.0)
PLATELETS: 194 10*3/uL (ref 150–400)
RBC: 2.89 MIL/uL — ABNORMAL LOW (ref 3.87–5.11)
RDW: 16.3 % — ABNORMAL HIGH (ref 11.5–15.5)
WBC: 4.4 10*3/uL (ref 4.0–10.5)

## 2015-11-16 LAB — BASIC METABOLIC PANEL
Anion gap: 8 (ref 5–15)
BUN: 11 mg/dL (ref 4–21)
BUN: 11 mg/dL (ref 6–20)
CO2: 23 mmol/L (ref 22–32)
CREATININE: 1.15 mg/dL — AB (ref 0.44–1.00)
CREATININE: 1.2 mg/dL — AB (ref 0.5–1.1)
Calcium: 8 mg/dL — ABNORMAL LOW (ref 8.9–10.3)
Chloride: 105 mmol/L (ref 101–111)
GFR calc Af Amer: 52 mL/min — ABNORMAL LOW (ref >60)
GFR, EST NON AFRICAN AMERICAN: 45 mL/min — AB (ref >60)
GLUCOSE: 146 mg/dL — AB (ref 65–99)
Glucose: 146 mg/dL
Potassium: 3.9 mmol/L (ref 3.4–5.3)
Potassium: 3.9 mmol/L (ref 3.5–5.1)
SODIUM: 136 mmol/L (ref 135–145)
SODIUM: 136 mmol/L — AB (ref 137–147)

## 2015-11-16 LAB — CBC AND DIFFERENTIAL
HCT: 27 % — AB (ref 36–46)
Hemoglobin: 8.1 g/dL — AB (ref 12.0–16.0)
Platelets: 194 10*3/uL (ref 150–399)
WBC: 4.4 10^3/mL

## 2015-11-16 MED ORDER — IOPAMIDOL (ISOVUE-300) INJECTION 61%
15.0000 mL | INTRAVENOUS | Status: AC
  Administered 2015-11-16 – 2015-11-17 (×3): 15 mL via ORAL

## 2015-11-16 NOTE — Progress Notes (Signed)
Patient Name: Dawn [name] Date of Encounter: 11/16/2015  Primary Cardiologist:  Kirk Ruths, M.D.  Hospital Problem List     Principal Problem:   Acute kidney injury [facility]) Active Problems:   Acute delirium   Normocytic anemia   Anxiety and depression   Chronic pain syndrome   Recurrent UTI   Acute respiratory failure with hypoxia (HCC)   COPD (chronic obstructive pulmonary disease) (HCC)   Acute hyperglycemia   CKD (chronic kidney disease) stage 3, GFR 30-59 ml/min   Narrow complex tachycardia (HCC)   HTN (hypertension)   Anemia   Delirium   Hypoxemia   Tachycardia   Generalized weakness   Septic shock (HCC)   Acute encephalopathy   Hypokalemia   Sepsis (HCC)   Hyperthyroidism   Hypotension   Premature atrial contractions   Functional diarrhea     Subjective   The patient feels well. She denies orthopnea and PND. Tachycardia has been under better control. Beta blocker has been titrated to the limits of acceptable blood pressure.  Inpatient Medications    Scheduled Meds: . acidophilus  1 capsule Oral Daily  . ALPRAZolam  0.5 mg Oral TID  . buPROPion  150 mg Oral Daily  . enoxaparin (LOVENOX) injection  40 mg Subcutaneous Q24H  . feeding supplement  1 Container Oral TID BM  . furosemide  20 mg Intravenous Once  . gabapentin  600 mg Oral TID  . iopamidol  15 mL Oral Q1 Hr x 2  . loperamide  2 mg Oral BID  . methimazole  10 mg Oral BID  . metoprolol tartrate  37.5 mg Oral BID  . oxyCODONE  5 mg Oral Q8H   Continuous Infusions:  PRN Meds: acetaminophen, levalbuterol, loperamide, ondansetron **OR** ondansetron (ZOFRAN) IV   Vital Signs    Vitals:   11/16/15 0300 11/16/15 0521 11/16/15 0953 11/16/15 1149  BP:  112/76 93/69   Pulse:  85 92   Resp:  18    Temp:  97.3 F (36.3 C)  98.7 F (37.1 C)  TempSrc:  Oral  Oral  SpO2:  93% 92%   Weight: 137 lb 2 oz (62.2 kg)     Height:        Intake/Output Summary (Last 24 hours) at 11/16/15  1337 Last data filed at 11/16/15 1145  Gross per 24 hour  Intake                0 ml  Output             1450 ml  Net            -1450 ml   Filed Weights   11/14/15 1423 11/15/15 0105 11/16/15 0300  Weight: 134 lb 4.8 oz (60.9 kg) 134 lb 4.8 oz (60.9 kg) 137 lb 2 oz (62.2 kg)    Physical Exam    GEN: Well nourished, well developed, in no acute distress.  HEENT: Grossly normal.  Neck: Supple, no JVD, carotid bruits, or masses. Cardiac: RRR, no murmurs, rubs, or gallops. No clubbing, cyanosis, edema.  Radials/DP/PT 2+ and equal bilaterally.  Respiratory:  Respirations regular and unlabored, clear to auscultation bilaterally. GI: Soft, nontender, nondistended, BS + x 4. MS: no deformity or atrophy. Skin: warm and dry, no rash. Neuro:  Strength and sensation are intact. Psych: AAOx3.  Normal affect.  Labs    CBC  Recent Labs  11/14/15 0556 11/15/15 0541 11/16/15 0933  WBC 4.3 5.1 4.4  NEUTROABS  2.7  --   --   HGB 7.3* 8.0* 8.1*  HCT 23.4* 26.8* 27.1*  MCV 91.4 92.7 93.8  PLT 178 214 062   Basic Metabolic Panel  Recent Labs  11/14/15 0556 11/15/15 0541 11/16/15 0933  NA 139 138 136  K 3.0* 4.0 3.9  CL 109 105 105  CO2 '24 27 23  '$ GLUCOSE 86 97 146*  BUN '11 11 11  '$ CREATININE 0.90 1.18* 1.15*  CALCIUM 7.3* 7.8* 8.0*  MG 1.9  --   --   PHOS 3.2  --   --    Liver Function Tests  Recent Labs  11/14/15 0556  ALBUMIN 1.6*   No results for input(s): LIPASE, AMYLASE in the last 72 hours. Cardiac Enzymes No results for input(s): CKTOTAL, CKMB, CKMBINDEX, TROPONINI in the last 72 hours. BNP Invalid input(s): POCBNP D-Dimer No results for input(s): DDIMER in the last 72 hours. Hemoglobin A1C No results for input(s): HGBA1C in the last 72 hours. Fasting Lipid Panel No results for input(s): CHOL, HDL, LDLCALC, TRIG, CHOLHDL, LDLDIRECT in the last 72 hours. Thyroid Function Tests No results for input(s): TSH, T4TOTAL, T3FREE, THYROIDAB in the last 72  hours.  Invalid input(s): FREET3  Telemetry    Sinus rhythm, PACs, brief runs of SVT. 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 No recent study since 11/14/15 - Personally Reviewed  Radiology    No results found.  Cardiac Studies   Echocardiogram, 11/12/15: ------------------------------------------------------------------- Study Conclusions  - Left ventricle: The cavity size was normal. Wall thickness was   normal. Systolic function was mildly reduced. The estimated   ejection fraction was in the range of 45% to 50%. Diffuse   hypokinesis. Doppler parameters are consistent with abnormal left   ventricular relaxation (grade 1 diastolic dysfunction). - Aortic valve: There was mild to moderate regurgitation directed   centrally in the LVOT. - Mitral valve: There was mild regurgitation. - Pulmonary arteries: Systolic pressure was mildly increased. PA   peak pressure: 41 mm Hg (S).  Impressions:  - No cardiac source of embolism was identified, but cannot be ruled   out on the basis of this examination. Paroxysmal atrial   tachycardia occurred a few times during the study. The reported   PA pressure estimate was based on a recording during SVT. PA   pressure appears to be lower during nomal rhythm.   Patient Profile     75 year old admitted with shock, encephalopathy, UTI, and noted to have supraventricular tachycardia. Also had severe anemia. Also noted to have hyperthyroidism, COPD, and diarrheal illness. Heart rhythm has improved with oral titration of beta blocker therapy.  Assessment & Plan    1. SVT with frequent PACs and PVCs. Rhythm has come under relatively good control with beta blocker titration. No further upward titration is possible due to relatively low blood pressure. Continue current dose and follow clinically.  Signed, Sinclair Grooms, MD  11/16/2015, 1:37 PM

## 2015-11-16 NOTE — Progress Notes (Signed)
Patient's heartrate in the 130s and 140s.  Does have some pain, and up to the bedside commode. Informed the oncall of this.  If patient sustains the 140s after the medication, we are to call cardiology.

## 2015-11-16 NOTE — Progress Notes (Signed)
Patient's daughter called to check on patient.  I informed her Patient's heart rate is 87, she is sleeping and appears to be comfortable.  She states she will try to call the MD tomorrow.

## 2015-11-16 NOTE — Care Management Important Message (Signed)
Important Message  Patient Details  Name: Dawn [name] MRN: [id number] Date of Birth: [birth year]   Medicare Important Message Given:  Yes    [name] 11/16/2015, 12:15 PM

## 2015-11-16 NOTE — Progress Notes (Signed)
[facility]ious [facility]   Reason for visit: Follow up on fever  Interval History: developed fever to 102 overnight, no chills, no sob, no new rash, diarrhea the same  Physical Exam: Constitutional:  Vitals:   11/16/15 1149 11/16/15 1344  BP:  (!) 95/57  Pulse:  92  Resp:  18  Temp: 98.7 F (37.1 C) 98.4 F (36.9 C)   patient appears in NAD Eyes: anicteric HENT: no thrush Respiratory: Normal respiratory effort; CTA B Cardiovascular: RRR GI: soft, nt, nd  Review of Systems: Gastrointestinal: positive for diarrhea, negative for nausea Musculoskeletal: negative for myalgias and arthralgias  Lab Results  Component Value Date   WBC 4.4 11/16/2015   HGB 8.1 (L) 11/16/2015   HCT 27.1 (L) 11/16/2015   MCV 93.8 11/16/2015   PLT 194 11/16/2015    Lab Results  Component Value Date   CREATININE 1.15 (H) 11/16/2015   BUN 11 11/16/2015   NA 136 11/16/2015   K 3.9 11/16/2015   CL 105 11/16/2015   CO2 23 11/16/2015    Lab Results  Component Value Date   ALT 8 (L) 11/11/2015   AST 12 (L) 11/11/2015   ALKPHOS 52 11/11/2015     Microbiology: Recent Results (from the past 240 hour(s))  Urine culture     Status: None   Collection Time: 11/10/15  2:30 AM  Result Value Ref Range Status   Specimen Description   Final    URINE, CLEAN CATCH Performed at [place] Requests NONE  Final   Culture NO GROWTH  Final   Report Status 11/11/2015 FINAL  Final  Culture, blood (Routine X 2) w Reflex to ID Panel     Status: None   Collection Time: 11/10/15  9:15 AM  Result Value Ref Range Status   Specimen Description BLOOD LEFT ARM  Final   Special Requests IN PEDIATRIC BOTTLE 2CC  Final   Culture NO GROWTH 5 DAYS  Final   Report Status 11/15/2015 FINAL  Final  Culture, blood (Routine X 2) w Reflex to ID Panel     Status: Abnormal   Collection Time: 11/10/15  9:20 AM  Result Value Ref Range Status   Specimen Description BLOOD LEFT HAND   Final   Special Requests IN PEDIATRIC BOTTLE 2CC  Final   Culture  Setup Time   Final    AEROBIC BOTTLE ONLY GRAM POSITIVE COCCI IN CLUSTERS CRITICAL RESULT CALLED TO, READ BACK BY AND VERIFIED WITH: GREG ABBOTT,PHARMD '@0019'$  11/12/15 MKELLY,MLT    Culture (A)  Final    STAPHYLOCOCCUS SPECIES (COAGULASE NEGATIVE) THE SIGNIFICANCE OF ISOLATING THIS ORGANISM FROM A SINGLE VENIPUNCTURE CANNOT BE PREDICTED WITHOUT FURTHER CLINICAL AND CULTURE CORRELATION. SUSCEPTIBILITIES AVAILABLE ONLY ON REQUEST.    Report Status 11/13/2015 FINAL  Final  C difficile quick scan w PCR reflex     Status: None   Collection Time: 11/11/15  4:54 PM  Result Value Ref Range Status   C Diff antigen NEGATIVE NEGATIVE Final   C Diff toxin NEGATIVE NEGATIVE Final   C Diff interpretation No C. difficile detected.  Final  Gastrointestinal Panel by PCR , Stool     Status: None   Collection Time: 11/11/15  4:54 PM  Result Value Ref Range Status   Campylobacter species NOT DETECTED NOT DETECTED Final   Plesimonas shigelloides NOT DETECTED NOT DETECTED Final   Salmonella species NOT DETECTED NOT DETECTED Final   Yersinia enterocolitica NOT  DETECTED NOT DETECTED Final   Vibrio species NOT DETECTED NOT DETECTED Final   Vibrio cholerae NOT DETECTED NOT DETECTED Final   Enteroaggregative E coli (EAEC) NOT DETECTED NOT DETECTED Final   Enteropathogenic E coli (EPEC) NOT DETECTED NOT DETECTED Final   Enterotoxigenic E coli (ETEC) NOT DETECTED NOT DETECTED Final   Shiga like toxin producing E coli (STEC) NOT DETECTED NOT DETECTED Final   Shigella/Enteroinvasive E coli (EIEC) NOT DETECTED NOT DETECTED Final   Cryptosporidium NOT DETECTED NOT DETECTED Final   Cyclospora cayetanensis NOT DETECTED NOT DETECTED Final   Entamoeba histolytica NOT DETECTED NOT DETECTED Final   Giardia lamblia NOT DETECTED NOT DETECTED Final   Adenovirus F40/41 NOT DETECTED NOT DETECTED Final   Astrovirus NOT DETECTED NOT DETECTED Final   Norovirus  GI/GII NOT DETECTED NOT DETECTED Final   Rotavirus A NOT DETECTED NOT DETECTED Final   Sapovirus (I, II, IV, and V) NOT DETECTED NOT DETECTED Final  MRSA PCR Screening     Status: None   Collection Time: 11/11/15 11:43 PM  Result Value Ref Range Status   MRSA by PCR NEGATIVE NEGATIVE Final    Comment:        The GeneXpert MRSA Assay (FDA approved for NASAL specimens only), is one component of a comprehensive MRSA colonization surveillance program. It is not intended to diagnose MRSA infection nor to guide or monitor treatment for MRSA infections.     Impression/Plan:  1. Fever - no clear cause.  CT today.  Differential includes medications,  2. Hyperthyroid - on methimazole.  I suspect is the cause of her symptoms 3. SVT - on beta blocker

## 2015-11-16 NOTE — Progress Notes (Signed)
Physical Therapy Treatment Patient Details Name: Dawn [name] MRN: [id number] DOB: 05/24/1940 Today's Date: 11/16/2015    History of Present Illness Pt is a 75 y/o female admitted secondary to AMS. PMH including but not limited to OA, anxiety and depression.    PT Comments    Pt with noted confusion and impulsivity this date. Pt unsafe to return home alone at this time. Ambulation limited by diarrhea and stool incontinence. Acute PT to follow and reassess tomorrow for d/c recommendations.  Follow Up Recommendations  Supervision for mobility/OOB;SNF     Equipment Recommendations  Rolling walker with 5" wheels    Recommendations for Other Services       Precautions / Restrictions Precautions Precautions: Fall Precaution Comments: impulsive, confused Restrictions Weight Bearing Restrictions: No    Mobility  Bed Mobility Overal bed mobility: Needs Assistance Bed Mobility: Supine to Sit;Sit to Supine     Supine to sit: Min assist Sit to supine: Mod assist   General bed mobility comments: minA for safety due to impulsivity and urgency to use BSC  Transfers Overall transfer level: Needs assistance Equipment used: None Transfers: Sit to/from [facility] Sit to Stand: Min assist Stand pivot transfers: Min assist       General transfer comment: minA due to impulsivity,   Ambulation/Gait Ambulation/Gait assistance: Min assist Ambulation Distance (Feet):  (5 steps to/from sink) Assistive device: 1 person hand held assist Gait Pattern/deviations: Step-to pattern;Decreased stride length Gait velocity: decreased Gait velocity interpretation: <1.8 ft/sec, indicative of risk for recurrent falls General Gait Details: pt reaching for furniture, impulsive   Stairs            Wheelchair Mobility    Modified Rankin (Stroke Patients Only)       Balance Overall balance assessment: Needs assistance Sitting-balance support: Feet  supported Sitting balance-Leahy Scale: Good     Standing balance support: Single extremity supported Standing balance-Leahy Scale: Poor Standing balance comment: required L UE support to perform hygiene, and leaned against counter to wash hands                    Cognition Arousal/Alertness: Awake/alert Behavior During Therapy: Impulsive Overall Cognitive Status: No family/caregiver present to determine baseline cognitive functioning Area of Impairment: Orientation;Safety/judgement;Problem solving;Awareness Orientation Level: Disoriented to;Time;Situation;Place   Memory: Decreased short-term memory   Safety/Judgement: Decreased awareness of safety;Decreased awareness of deficits Awareness: Emergent Problem Solving: Requires verbal cues;Requires tactile cues      Exercises      General Comments General comments (skin integrity, edema, etc.): pt with episode of stool and urinary incontinence      Pertinent Vitals/Pain Pain Assessment: Faces Faces Pain Scale: Hurts even more Pain Location: hip Pain Descriptors / Indicators: Grimacing Pain Intervention(s): Monitored during session    Home Living                      Prior Function            PT Goals (current goals can now be found in the care plan section) Progress towards PT goals: Progressing toward goals    Frequency    Min 3X/week      PT Plan Current plan remains appropriate    Co-evaluation             End of Session Equipment Utilized During Treatment: Gait belt Activity Tolerance: Patient limited by fatigue;Patient limited by lethargy Patient left: in bed;with call bell/phone within reach;with bed alarm set  Time: 1645-1700 PT Time Calculation (min) (ACUTE ONLY): 15 min  Charges:  $Therapeutic Activity: 8-22 mins                    G Codes:      Kingsley Callander 11/16/2015, 5:07 PM   Kittie Plater, PT, DPT Pager #: [phone] Office #: [phone]

## 2015-11-16 NOTE — Progress Notes (Addendum)
PROGRESS NOTE    Dawn [name]  XBJ:[id number] DOB: Oct 11, 1940 DOA: 11/10/2015 PCP: [name], MD   Brief Narrative: Patient is a 75 y.o. female with past medical history of anxiety and depression, recently treated in the outpatient setting for a UTI with Macrobid, admitted for evaluation of acute encephalopathy. Per family, patient has been lethargic, with no significant oral intake for at least 2 weeks prior to this admission. She is also had some intermittent nausea,vomiting and diarrhea. She was found to be febrile, with low blood pressures and admitted to the hospital with empiric antibiotics and aggressive IV fluid resuscitation. She has now stabilized, however continues to have fever. Culture data and stool studies are unrevealing so far. Hospital course complicated by development of SVT , and new diagnosis of hyperthyroidism See below for further details  Subjective: Diarrhea better-had only 2 small bowel movements yesterday. Febrile last night. Awake and alert this morning  No chest pain.  Assessment/Plan: Principal Problem: Acute encephalopathy: Resolved.Likely metabolic encephalopathy in a setting of sepsis, dehydration and hyperthyroidism. Note, CT head on 10/31 negative for acute abnormalities.  Active Problems: Hypotension: Resolved, etiology initially thought to be sepsis, but could have been from hypovolemia from diarrhea. Follow.  ?Sepsis: Fever and hypotension during the early part of her hospital stay-initially thought to be sepsis due to UTI or diarrhea-and treated with vancomycin and Rocephin. Culture data are negative so far, in retrospect, she could have had diarrhea causing hypotension, and fever could be from hyperthyroidism. She was seen by infectious disease, all antibiotics have been discontinued on 11/3, she remained afebrile until last night-way she had a fever of 102.94F-spoke with infectious disease, we'll repeat blood cultures today and plan on  doing a CT of the chest and abdomen to look for sources of infection. Continue to monitor off antibiotics for now, await further recommendations from infectious disease.  Addendum: 6:30 PM Reviewed radiology report for CT abdomen-abdomen is benign on exam-have consulted GI (spoke with Dr Amedeo Plenty) for their opinion. May need a colonoscopy.GI will evaluate 11/7  SVT: Likely provoked by acute illness and newly diagnosed hyperthyroidism. Given improvement in her blood pressure, cardiology slowly optimizing dose of metoprolol. . Echo with EF around 45-50%.   Acute kidney injury: Resolved. Likely mild prerenal azotemia setting of sepsis/UTI/poor oral intake.   Hyperthyroidism:  New diagnoses-likely the cause of diarrhea/hypovolumia and SVT-not sure if all of her clinical presentation can be attributed to apathetic hyperthyroidism. Started on Tapazole-and metoprolol. Will need endocrinology follow-up as outpatient-have scheduled outpatient Endo follow up on 11/13.  Diarrhea: Continues-but much better-probably related to hyperthyroidism. Stool studies looking C. difficile and GI pathogen panel are negative.Continue scheduled Imodium and add a probiotic. Currently started on Tapazole-hopefully diarrhea was slowly abate with continued treatment of underlying hyperthyroidism.   Anemia: Appears to have chronic anemia at baseline-suspect acute drop in hemoglobin due to IV fluid dilution, acute illness. Transfused 1 unit of PRBC on 11/1-hemoglobin stable at 8.0 today. Her shortness of breath is much better after 1 dose of Lasix yesterday. Continue to follow hemoglobin periodically.   Mild Vol Overload: likely 2/2 aggressive IVF resuscitation->5L + balance-weight is up to 136 lb-IVF stopped on 11/3-will give Lasix 20 mg IV today as well-NO signs of congestive heart failure on exam.  Anxiety and depression: Continue Xanax and resume Wellbutrin.  History of chronic back pain: Have resumed narcotics as no longer  with hypotension and lethargy.  Chronic hypotension: Per family (daughter) patient's blood pressure is mostly in the  46F systolic range-random cortisol levels appear appropriate-  DVT Prophylaxis: Prophylactic Lovenox  Code Status: Full code   Family Communication: Spoke to patient's daughter on 11/6-over the phone  Disposition Plan: Remain inpatient-home in the next few days  Antimicrobial agents: Vanco 11/1>>11/3 Flagyl 11/1>>11/2 Rocephin 10/31>>11/3  Procedures: None  CONSULTS: PCCM Cards   Objective: Vitals:   11/16/15 0132 11/16/15 0300 11/16/15 0521 11/16/15 0953  BP:   112/76 93/69  Pulse:   85 92  Resp:   18   Temp: 98.3 F (36.8 C)  97.3 F (36.3 C)   TempSrc: Oral  Oral   SpO2:   93% 92%  Weight:  62.2 kg (137 lb 2 oz)    Height:        Intake/Output Summary (Last 24 hours) at 11/16/15 1102 Last data filed at 11/16/15 0803  Gross per 24 hour  Intake                0 ml  Output             1050 ml  Net            -1050 ml   Filed Weights   11/14/15 1423 11/15/15 0105 11/16/15 0300  Weight: 60.9 kg (134 lb 4.8 oz) 60.9 kg (134 lb 4.8 oz) 62.2 kg (137 lb 2 oz)    Examination:  General exam: Appears calm and comfortable  Respiratory system: Clear to auscultation. Respiratory effort normal. Neck:suppler-non tender thyroid Cardiovascular system: S1 & S2 heard, RRR. No JVD, murmurs, rubs, gallops or clicks. No pedal edema. Gastrointestinal system: Abdomen is nondistended, soft and nontender. No organomegaly or masses felt. Central nervous system: Alert and oriented. No focal neurological deficits. Extremities: Symmetric 5 x 5 power-has trace edema b/l Psychiatry: Judgement and insight appear normal. Mood & affect appropriate.     Data Reviewed: I have personally reviewed following labs and imaging studies  CBC:  Recent Labs Lab 11/10/15 0220  11/12/15 0455 11/13/15 0129 11/14/15 0556 11/15/15 0541 11/16/15 0933  WBC 9.8  <  > 7.4 7.5 4.3 5.1 4.4  NEUTROABS 7.5  --  5.6 5.8 2.7  --   --   HGB 8.5*  < > 8.4* 8.1* 7.3* 8.0* 8.1*  HCT 28.2*  < > 27.0* 26.3* 23.4* 26.8* 27.1*  MCV 92.8  < > 89.1 89.5 91.4 92.7 93.8  PLT 351  < > 202 243 178 214 194  < > = values in this interval not displayed. Basic Metabolic Panel:  Recent Labs Lab 11/12/15 0455 11/13/15 0129 11/14/15 0556 11/15/15 0541 11/16/15 0933  NA 139 139 139 138 136  K 4.2 3.6 3.0* 4.0 3.9  CL 108 106 109 105 105  CO2 '25 25 24 27 23  '$ GLUCOSE 92 125* 86 97 146*  BUN '10 13 11 11 11  '$ CREATININE 0.93 1.10* 0.90 1.18* 1.15*  CALCIUM 7.6* 7.3* 7.3* 7.8* 8.0*  MG 1.5* 2.4 1.9  --   --   PHOS 1.9* 3.7 3.2  --   --    GFR: Estimated Creatinine Clearance: 40.5 mL/min (by C-G formula based on SCr of 1.15 mg/dL (H)). Liver Function Tests:  Recent Labs Lab 11/10/15 0220 11/11/15 0734 11/12/15 0455 11/13/15 0129 11/14/15 0556  AST 12* 12*  --   --   --   ALT 9* 8*  --   --   --   ALKPHOS 68 52  --   --   --   BILITOT  0.6 0.4  --   --   --   PROT 6.4* 4.6*  --   --   --   ALBUMIN 2.5* 1.7* 1.8* 1.7* 1.6*   No results for input(s): LIPASE, AMYLASE in the last 168 hours. No results for input(s): AMMONIA in the last 168 hours. Coagulation Profile: No results for input(s): INR, PROTIME in the last 168 hours. Cardiac Enzymes:  Recent Labs Lab 11/10/15 0220 11/11/15 1805 11/11/15 2245 11/12/15 0455  TROPONINI <0.03 <0.03 <0.03 <0.03   BNP (last 3 results) No results for input(s): PROBNP in the last 8760 hours. HbA1C: No results for input(s): HGBA1C in the last 72 hours. CBG: No results for input(s): GLUCAP in the last 168 hours. Lipid Profile: No results for input(s): CHOL, HDL, LDLCALC, TRIG, CHOLHDL, LDLDIRECT in the last 72 hours. Thyroid Function Tests: No results for input(s): TSH, T4TOTAL, FREET4, T3FREE, THYROIDAB in the last 72 hours. Anemia Panel: No results for input(s): VITAMINB12, FOLATE, FERRITIN, TIBC, IRON, RETICCTPCT  in the last 72 hours. Sepsis Labs:  Recent Labs Lab 11/10/15 0231  11/11/15 0734 11/11/15 1805 11/12/15 0455 11/13/15 0129 11/14/15 0556  PROCALCITON  --   < >  --  0.59 0.64 0.38 0.28  LATICACIDVEN 1.30  --  0.8  --   --   --   --   < > = values in this interval not displayed.  Recent Results (from the past 240 hour(s))  Urine culture     Status: None   Collection Time: 11/10/15  2:30 AM  Result Value Ref Range Status   Specimen Description   Final    URINE, CLEAN CATCH Performed at [place] Requests NONE  Final   Culture NO GROWTH  Final   Report Status 11/11/2015 FINAL  Final  Culture, blood (Routine X 2) w Reflex to ID Panel     Status: None   Collection Time: 11/10/15  9:15 AM  Result Value Ref Range Status   Specimen Description BLOOD LEFT ARM  Final   Special Requests IN PEDIATRIC BOTTLE 2CC  Final   Culture NO GROWTH 5 DAYS  Final   Report Status 11/15/2015 FINAL  Final  Culture, blood (Routine X 2) w Reflex to ID Panel     Status: Abnormal   Collection Time: 11/10/15  9:20 AM  Result Value Ref Range Status   Specimen Description BLOOD LEFT HAND  Final   Special Requests IN PEDIATRIC BOTTLE 2CC  Final   Culture  Setup Time   Final    AEROBIC BOTTLE ONLY GRAM POSITIVE COCCI IN CLUSTERS CRITICAL RESULT CALLED TO, READ BACK BY AND VERIFIED WITH: GREG ABBOTT,PHARMD '@0019'$  11/12/15 MKELLY,MLT    Culture (A)  Final    STAPHYLOCOCCUS SPECIES (COAGULASE NEGATIVE) THE SIGNIFICANCE OF ISOLATING THIS ORGANISM FROM A SINGLE VENIPUNCTURE CANNOT BE PREDICTED WITHOUT FURTHER CLINICAL AND CULTURE CORRELATION. SUSCEPTIBILITIES AVAILABLE ONLY ON REQUEST.    Report Status 11/13/2015 FINAL  Final  C difficile quick scan w PCR reflex     Status: None   Collection Time: 11/11/15  4:54 PM  Result Value Ref Range Status   C Diff antigen NEGATIVE NEGATIVE Final   C Diff toxin NEGATIVE NEGATIVE Final   C Diff interpretation No C. difficile detected.  Final    Gastrointestinal Panel by PCR , Stool     Status: None   Collection Time: 11/11/15  4:54 PM  Result Value Ref Range Status   Campylobacter species  NOT DETECTED NOT DETECTED Final   Plesimonas shigelloides NOT DETECTED NOT DETECTED Final   Salmonella species NOT DETECTED NOT DETECTED Final   Yersinia enterocolitica NOT DETECTED NOT DETECTED Final   Vibrio species NOT DETECTED NOT DETECTED Final   Vibrio cholerae NOT DETECTED NOT DETECTED Final   Enteroaggregative E coli (EAEC) NOT DETECTED NOT DETECTED Final   Enteropathogenic E coli (EPEC) NOT DETECTED NOT DETECTED Final   Enterotoxigenic E coli (ETEC) NOT DETECTED NOT DETECTED Final   Shiga like toxin producing E coli (STEC) NOT DETECTED NOT DETECTED Final   Shigella/Enteroinvasive E coli (EIEC) NOT DETECTED NOT DETECTED Final   Cryptosporidium NOT DETECTED NOT DETECTED Final   Cyclospora cayetanensis NOT DETECTED NOT DETECTED Final   Entamoeba histolytica NOT DETECTED NOT DETECTED Final   Giardia lamblia NOT DETECTED NOT DETECTED Final   Adenovirus F40/41 NOT DETECTED NOT DETECTED Final   Astrovirus NOT DETECTED NOT DETECTED Final   Norovirus GI/GII NOT DETECTED NOT DETECTED Final   Rotavirus A NOT DETECTED NOT DETECTED Final   Sapovirus (I, II, IV, and V) NOT DETECTED NOT DETECTED Final  MRSA PCR Screening     Status: None   Collection Time: 11/11/15 11:43 PM  Result Value Ref Range Status   MRSA by PCR NEGATIVE NEGATIVE Final    Comment:        The GeneXpert MRSA Assay (FDA approved for NASAL specimens only), is one component of a comprehensive MRSA colonization surveillance program. It is not intended to diagnose MRSA infection nor to guide or monitor treatment for MRSA infections.          Radiology Studies: No results found.      Scheduled Meds: . acidophilus  1 capsule Oral Daily  . ALPRAZolam  0.5 mg Oral TID  . buPROPion  150 mg Oral Daily  . enoxaparin (LOVENOX) injection  40 mg Subcutaneous Q24H   . feeding supplement  1 Container Oral TID BM  . furosemide  20 mg Intravenous Once  . gabapentin  600 mg Oral TID  . loperamide  2 mg Oral BID  . methimazole  10 mg Oral BID  . metoprolol tartrate  37.5 mg Oral BID  . oxyCODONE  5 mg Oral Q8H   Continuous Infusions:    LOS: 6 days    Time spent: [place], [facility]   If 7PM-7AM, please contact night-coverage [email or web address] Password [email or web address] 11/16/2015, 11:02 AM

## 2015-11-17 ENCOUNTER — Inpatient Hospital Stay (HOSPITAL_COMMUNITY): Payer: Medicare Other

## 2015-11-17 DIAGNOSIS — R188 Other ascites: Secondary | ICD-10-CM

## 2015-11-17 LAB — BASIC METABOLIC PANEL
ANION GAP: 7 (ref 5–15)
BUN: 11 mg/dL (ref 4–21)
BUN: 11 mg/dL (ref 6–20)
CALCIUM: 8.4 mg/dL — AB (ref 8.9–10.3)
CO2: 30 mmol/L (ref 22–32)
Chloride: 101 mmol/L (ref 101–111)
Creatinine, Ser: 1.14 mg/dL — ABNORMAL HIGH (ref 0.44–1.00)
Creatinine: 1.1 mg/dL (ref 0.5–1.1)
GFR, EST AFRICAN AMERICAN: 53 mL/min — AB (ref >60)
GFR, EST NON AFRICAN AMERICAN: 46 mL/min — AB (ref >60)
Glucose, Bld: 94 mg/dL (ref 65–99)
Glucose: 94 mg/dL
POTASSIUM: 4.3 mmol/L (ref 3.4–5.3)
Potassium: 4.3 mmol/L (ref 3.5–5.1)
SODIUM: 138 mmol/L (ref 137–147)
Sodium: 138 mmol/L (ref 135–145)

## 2015-11-17 MED ORDER — FUROSEMIDE 10 MG/ML IJ SOLN
20.0000 mg | Freq: Once | INTRAMUSCULAR | Status: AC
  Administered 2015-11-17: 20 mg via INTRAVENOUS
  Filled 2015-11-17: qty 2

## 2015-11-17 MED ORDER — IOPAMIDOL (ISOVUE-300) INJECTION 61%
INTRAVENOUS | Status: AC
  Administered 2015-11-17: 15 mL via ORAL
  Filled 2015-11-17: qty 30

## 2015-11-17 MED ORDER — ENSURE ENLIVE PO LIQD: 237.0000 mL | Freq: Two times a day (BID) | ORAL | Status: DC

## 2015-11-17 NOTE — Progress Notes (Signed)
Patient ID: Dawn [name], female   DOB: 1940/06/19, 75 y.o.   MRN: [id number]  Request received for abscess aspiration drain of abdominal fluid collection  Dr Annamaria Boots has reviewed imaging. Feels findings may be either redundant sigmoid or diverticulitis. May be inflammatory process. Rec: rescan in few days with oral contrast  Note in chart does suggest GI MD will be evaluating pt today. May await recommendation from that service also.  If further questions Please contact Dr Annamaria Boots at [phone]  Will inform Dr Sloan Leiter

## 2015-11-17 NOTE — Progress Notes (Signed)
[facility]   Reason for visit: Follow up on fever  Interval History: afebrile; no new complaints  Physical Exam: Constitutional:  Vitals:   11/17/15 0512 11/17/15 0938  BP: (!) 98/59 (!) 106/56  Pulse: 76 92  Resp: 18   Temp: 97.7 F (36.5 C) 99 F (37.2 C)   patient appears in NAD Eyes: anicteric HENT: no thrush Respiratory: Normal respiratory effort; CTA B   Review of Systems: Gastrointestinal: positive for diarrhea, negative for nausea Musculoskeletal: negative for myalgias and arthralgias  Lab Results  Component Value Date   WBC 4.4 11/16/2015   HGB 8.1 (L) 11/16/2015   HCT 27.1 (L) 11/16/2015   MCV 93.8 11/16/2015   PLT 194 11/16/2015    Lab Results  Component Value Date   CREATININE 1.14 (H) 11/17/2015   BUN 11 11/17/2015   NA 138 11/17/2015   K 4.3 11/17/2015   CL 101 11/17/2015   CO2 30 11/17/2015    Lab Results  Component Value Date   ALT 8 (L) 11/11/2015   AST 12 (L) 11/11/2015   ALKPHOS 52 11/11/2015     Microbiology: Recent Results (from the past 240 hour(s))  Urine culture     Status: None   Collection Time: 11/10/15  2:30 AM  Result Value Ref Range Status   Specimen Description   Final    URINE, CLEAN CATCH Performed at [facility] Requests NONE  Final   Culture NO GROWTH  Final   Report Status 11/11/2015 FINAL  Final  Culture, blood (Routine X 2) w Reflex to ID Panel     Status: None   Collection Time: 11/10/15  9:15 AM  Result Value Ref Range Status   Specimen Description BLOOD LEFT ARM  Final   Special Requests IN PEDIATRIC BOTTLE 2CC  Final   Culture NO GROWTH 5 DAYS  Final   Report Status 11/15/2015 FINAL  Final  Culture, blood (Routine X 2) w Reflex to ID Panel     Status: Abnormal   Collection Time: 11/10/15  9:20 AM  Result Value Ref Range Status   Specimen Description BLOOD LEFT HAND  Final   Special Requests IN PEDIATRIC BOTTLE 2CC  Final   Culture  Setup Time    Final    AEROBIC BOTTLE ONLY GRAM POSITIVE COCCI IN CLUSTERS CRITICAL RESULT CALLED TO, READ BACK BY AND VERIFIED WITH: GREG ABBOTT,PHARMD '@0019'$  11/12/15 MKELLY,MLT    Culture (A)  Final    STAPHYLOCOCCUS SPECIES (COAGULASE NEGATIVE) THE SIGNIFICANCE OF ISOLATING THIS ORGANISM FROM A SINGLE VENIPUNCTURE CANNOT BE PREDICTED WITHOUT FURTHER CLINICAL AND CULTURE CORRELATION. SUSCEPTIBILITIES AVAILABLE ONLY ON REQUEST.    Report Status 11/13/2015 FINAL  Final  C difficile quick scan w PCR reflex     Status: None   Collection Time: 11/11/15  4:54 PM  Result Value Ref Range Status   C Diff antigen NEGATIVE NEGATIVE Final   C Diff toxin NEGATIVE NEGATIVE Final   C Diff interpretation No C. difficile detected.  Final  Gastrointestinal Panel by PCR , Stool     Status: None   Collection Time: 11/11/15  4:54 PM  Result Value Ref Range Status   Campylobacter species NOT DETECTED NOT DETECTED Final   Plesimonas shigelloides NOT DETECTED NOT DETECTED Final   Salmonella species NOT DETECTED NOT DETECTED Final   Yersinia enterocolitica NOT DETECTED NOT DETECTED Final   Vibrio species NOT DETECTED NOT DETECTED Final  Vibrio cholerae NOT DETECTED NOT DETECTED Final   Enteroaggregative E coli (EAEC) NOT DETECTED NOT DETECTED Final   Enteropathogenic E coli (EPEC) NOT DETECTED NOT DETECTED Final   Enterotoxigenic E coli (ETEC) NOT DETECTED NOT DETECTED Final   Shiga like toxin producing E coli (STEC) NOT DETECTED NOT DETECTED Final   Shigella/Enteroinvasive E coli (EIEC) NOT DETECTED NOT DETECTED Final   Cryptosporidium NOT DETECTED NOT DETECTED Final   Cyclospora cayetanensis NOT DETECTED NOT DETECTED Final   Entamoeba histolytica NOT DETECTED NOT DETECTED Final   Giardia lamblia NOT DETECTED NOT DETECTED Final   Adenovirus F40/41 NOT DETECTED NOT DETECTED Final   Astrovirus NOT DETECTED NOT DETECTED Final   Norovirus GI/GII NOT DETECTED NOT DETECTED Final   Rotavirus A NOT DETECTED NOT DETECTED  Final   Sapovirus (I, II, IV, and V) NOT DETECTED NOT DETECTED Final  MRSA PCR Screening     Status: None   Collection Time: 11/11/15 11:43 PM  Result Value Ref Range Status   MRSA by PCR NEGATIVE NEGATIVE Final    Comment:        The GeneXpert MRSA Assay (FDA approved for NASAL specimens only), is one component of a comprehensive MRSA colonization surveillance program. It is not intended to diagnose MRSA infection nor to guide or monitor treatment for MRSA infections.     Impression/Plan:  1. Fever - no clear cause.  CT noted and GI seeing as well. Unclear what the process is.  CT with oral contrast today.  2. Hyperthyroid - on methimazole. 3. SVT - on beta blocker

## 2015-11-17 NOTE — Progress Notes (Signed)
Physical Therapy Treatment Patient Details Name: Dawn [name] MRN: [id number] DOB: Sep 03, 1940 Today's Date: 11/17/2015    History of Present Illness Pt is a 75 y/o female admitted secondary to AMS. PMH including but not limited to OA, anxiety and depression.    PT Comments    Pt performed increased activity.  Pt remains impulsive with safety deficits and would continue to benefit from short term skilled rehab at SNF to improve safety before returning home.    Follow Up Recommendations  Supervision for mobility/OOB;SNF     Equipment Recommendations  Rolling walker with 5" wheels    Recommendations for Other Services       Precautions / Restrictions Precautions Precautions: Fall Restrictions Weight Bearing Restrictions: No    Mobility  Bed Mobility Overal bed mobility: Needs Assistance Bed Mobility: Supine to Sit     Supine to sit: Supervision     General bed mobility comments: No assist needed, Supervision for technique.    Transfers Overall transfer level: Needs assistance Equipment used: None Transfers: Sit to/from Stand Sit to Stand: Min assist Stand pivot transfers: Min assist       General transfer comment: minA due to impulsivity, LOB on 1st attempt.  Cues for hand placement to and from seated surface.    Ambulation/Gait Ambulation/Gait assistance: Min guard Ambulation Distance (Feet): 10 Feet (+ 65f.  ) Assistive device: Rolling walker (2 wheeled) Gait Pattern/deviations: Step-through pattern;Trunk flexed;Decreased stride length Gait velocity: decreased   General Gait Details: pt reaching for furniture, impulsive   Stairs            Wheelchair Mobility    Modified Rankin (Stroke Patients Only)       Balance Overall balance assessment: Needs assistance Sitting-balance support: Feet supported Sitting balance-Leahy Scale: Good       Standing balance-Leahy Scale: Fair Standing balance comment: Pt able to perform perianal care  at RW and washing hands at sink.                      Cognition Arousal/Alertness: Awake/alert Behavior During Therapy: WFL for tasks assessed/performed;Impulsive Overall Cognitive Status: No family/caregiver present to determine baseline cognitive functioning Area of Impairment: Safety/judgement (leaving RW at times, RW safety.  )         Safety/Judgement: Decreased awareness of safety;Decreased awareness of deficits          Exercises      General Comments        Pertinent Vitals/Pain Pain Assessment: No/denies pain    Home Living                      Prior Function            PT Goals (current goals can now be found in the care plan section) Acute Rehab PT Goals Patient Stated Goal: return home Potential to Achieve Goals: Good Progress towards PT goals: Progressing toward goals    Frequency    Min 3X/week      PT Plan Current plan remains appropriate    Co-evaluation             End of Session Equipment Utilized During Treatment: Gait belt Activity Tolerance: Patient tolerated treatment well Patient left: in chair;with call bell/phone within reach;with chair alarm set     Time: [address]53PT Time Calculation (min) (ACUTE ONLY): 31 min  Charges:  $Gait Training: 8-22 mins $Therapeutic Activity: 8-22 mins  G Codes:      Cristela Blue 11/17/2015, 2:48 PM Governor Rooks, PTA pager [phone]

## 2015-11-17 NOTE — Progress Notes (Signed)
Nutrition Follow-up  DOCUMENTATION CODES:   Severe malnutrition in context of chronic illness  INTERVENTION:   -D/c Boost Breeze po TID, each supplement provides 250 kcal and 9 grams of protein -Ensure Enlive po BID, each supplement provides 350 kcal and 20 grams of protein  NUTRITION DIAGNOSIS:   Inadequate oral intake related to lethargy/confusion as evidenced by meal completion < 25%.  Ongoing  GOAL:   Patient will meet greater than or equal to 90% of their needs  Progressing  MONITOR:   PO intake, Supplement acceptance, Labs, Weight trends, I & O's  REASON FOR ASSESSMENT:   Malnutrition Screening Tool    ASSESSMENT:   75 y.o.femalewith past medical history of anxiety and depression, recently treated in the outpatient setting for a UTI with Macrobid, admitted for evaluation of acute encephalopathy. Per family, patient has been lethargic, with no significant oral intake for at least 2 weeks prior to this admission. She is also had some intermittent nausea and vomiting. She was subsequently admitted and started on broad-spectrum antibiotics. See below for further details  Spoke with pt at bedside, who was very drowsy at time of visit. She states that her appetite is "fair" and did "ok" with breakfast. Meal completion is variable; PO: 20-100% per doc flowsheets. Per MAR, pt has been refusing Boost Breeze supplements.   GI saw pt just prior to RD visit due to probable inflammatory process with fluid collection in the setting of persistent culture negative diarrhea and fever. IR to see if there are further imaging studies for further investigate fluid collection vs redundant rectosigmoid loop.   Nutrition-Focused physical exam completed. Findings are moderate to severe fat depletion, moderate to severe muscle depletion, and no edema.   Labs reviewed.  Diet Order:  Diet Heart Room service appropriate? Yes; Fluid consistency: Thin  Skin:  Reviewed, no issues  Last BM:   11/17/15  Height:   Ht Readings from Last 1 Encounters:  11/14/15 '5\' 7"'$  (1.702 m)    Weight:   Wt Readings from Last 1 Encounters:  11/17/15 135 lb 5.8 oz (61.4 kg)    Ideal Body Weight:  64 kg  BMI:  Body mass index is 21.2 kg/m.  Estimated Nutritional Needs:   Kcal:  1500-1700  Protein:  75-85 gm  Fluid:  1.5-1.7 L  EDUCATION NEEDS:   No education needs identified at this time  Bryleigh Ottaway A. Jimmye Norman, RD, LDN, CDE Pager: [phone] After hours Pager: [phone]
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
## 2015-11-17 NOTE — Consult Note (Signed)
[facility] Consult Note  Referring Provider: No ref. provider found Primary Care Physician:  [name], MD Primary Gastroenterologist:  Dr.  Laurel Dimmer Complaint: Diarrhea and fever HPI: Dawn [name] is an 75 y.o.female  who presented with diarrhea and recurrent fevers on multiple antibiotics with multiple negative cultures, antibiotics currently stopped. Recent CT scan obtained without oral contrast revealing wall thickening of the rectosigmoid colon with adjacent inflammatory changes with a air mottled lucency along the left aspect of the sigmoid colon measuring 3 to have by 4-1/2 cm differential diagnosis felt to be a contained perforation from colon carcinoma or chronic diverticulitis or simply a redundant rectosigmoid loop. The patient states she has had a colonoscopy in the remote past but does not know the results. Interventional radiology has also been consulted based on the CT appearance.  Past Medical History:  Diagnosis Date  . COPD (chronic obstructive pulmonary disease) ([place])   . History of anxiety   . History of depression   . Spinal stenosis   . UTI (urinary tract infection)     Past Surgical History:  Procedure Laterality Date  . NO PAST SURGERIES      Medications Prior to Admission  Medication Sig Dispense Refill  . ALPRAZolam (XANAX) 1 MG tablet Take 1 mg by mouth 3 (three) times daily as needed for anxiety.    Marland Kitchen buPROPion (WELLBUTRIN XL) 150 MG 24 hr tablet Take 150 mg by mouth daily.    Marland Kitchen FERROCITE 324 MG TABS tablet Take 324 mg by mouth daily.  2  . gabapentin (NEURONTIN) 600 MG tablet Take 600 mg by mouth 3 (three) times daily.  3  . INCRUSE ELLIPTA 62.5 MCG/INH AEPB Inhale 1 puff into the lungs daily.  3  . Oxycodone HCl 10 MG TABS Take 10 mg by mouth every 8 (eight) hours.  0  . traZODone (DESYREL) 100 MG tablet Take 100 mg by mouth at bedtime.    . valsartan (DIOVAN) 160 MG tablet Take 160 mg by mouth daily.  4    Allergies: No Known  Allergies  Family History  Problem Relation Age of Onset  . Heart attack Mother   . Diabetes Father     Social History:  reports that she has quit smoking. She has never used smokeless tobacco. She reports that she does not drink alcohol. Her drug history is not on file.  Review of Systems: negative except As above   Blood pressure (!) 106/56, pulse 92, temperature 99 F (37.2 C), temperature source Oral, resp. rate 18, height '5\' 7"'  (1.702 m), weight 61.4 kg (135 lb 5.8 oz), SpO2 90 %. Head: Normocephalic, without obvious abnormality, atraumatic Neck: no adenopathy, no carotid bruit, no JVD, supple, symmetrical, trachea midline and thyroid not enlarged, symmetric, no tenderness/mass/nodules Resp: clear to auscultation bilaterally Cardio: regular rate and rhythm, S1, S2 normal, no murmur, click, rub or gallop GI: Abdomen soft slightly distended no obvious left lower quadrant mass or tenderness Extremities: extremities normal, atraumatic, no cyanosis or edema  Results for orders placed or performed during the hospital encounter of 11/10/15 (from the past 48 hour(s))  CBC     Status: Abnormal   Collection Time: 11/16/15  9:33 AM  Result Value Ref Range   WBC 4.4 4.0 - 10.5 K/uL   RBC 2.89 (L) 3.87 - 5.11 MIL/uL   Hemoglobin 8.1 (L) 12.0 - 15.0 g/dL   HCT 27.1 (L) 36.0 - 46.0 %   MCV 93.8 78.0 - 100.0 fL  MCH 28.0 26.0 - 34.0 pg   MCHC 29.9 (L) 30.0 - 36.0 g/dL   RDW 16.3 (H) 11.5 - 15.5 %   Platelets 194 150 - 400 K/uL  Basic metabolic panel     Status: Abnormal   Collection Time: 11/16/15  9:33 AM  Result Value Ref Range   Sodium 136 135 - 145 mmol/L   Potassium 3.9 3.5 - 5.1 mmol/L   Chloride 105 101 - 111 mmol/L   CO2 23 22 - 32 mmol/L   Glucose, Bld 146 (H) 65 - 99 mg/dL   BUN 11 6 - 20 mg/dL   Creatinine, Ser 1.15 (H) 0.44 - 1.00 mg/dL   Calcium 8.0 (L) 8.9 - 10.3 mg/dL   GFR calc non Af Amer 45 (L) >60 mL/min   GFR calc Af Amer 52 (L) >60 mL/min    Comment:  (NOTE) The eGFR has been calculated using the CKD EPI equation. This calculation has not been validated in all clinical situations. eGFR's persistently <60 mL/min signify possible Chronic Kidney Disease.    Anion gap 8 5 - 15  Basic metabolic panel     Status: Abnormal   Collection Time: 11/17/15  8:27 AM  Result Value Ref Range   Sodium 138 135 - 145 mmol/L   Potassium 4.3 3.5 - 5.1 mmol/L   Chloride 101 101 - 111 mmol/L   CO2 30 22 - 32 mmol/L   Glucose, Bld 94 65 - 99 mg/dL   BUN 11 6 - 20 mg/dL   Creatinine, Ser 1.14 (H) 0.44 - 1.00 mg/dL   Calcium 8.4 (L) 8.9 - 10.3 mg/dL   GFR calc non Af Amer 46 (L) >60 mL/min   GFR calc Af Amer 53 (L) >60 mL/min    Comment: (NOTE) The eGFR has been calculated using the CKD EPI equation. This calculation has not been validated in all clinical situations. eGFR's persistently <60 mL/min signify possible Chronic Kidney Disease.    Anion gap 7 5 - 15   Ct Abdomen Pelvis Wo Contrast  Addendum Date: 11/16/2015   ADDENDUM REPORT: 11/16/2015 16:51 ADDENDUM: These results were called by telephone at the time of interpretation on 11/16/2015 at 4:51 pm to Dr. Oren Binet , who verbally acknowledged these results. Electronically Signed   By: Marin Olp M.D.   On: 11/16/2015 16:51   Result Date: 11/16/2015 CLINICAL DATA:  Fever overnight. EXAM: CT CHEST, ABDOMEN AND PELVIS WITHOUT CONTRAST TECHNIQUE: Multidetector CT imaging of the chest, abdomen and pelvis was performed following the standard protocol without IV contrast. COMPARISON:  Chest CT 03/19/2009 FINDINGS: CT CHEST FINDINGS Cardiovascular: Mild cardiomegaly. Calcified plaque over the left anterior descending and lateral circumflex coronary arteries. Calcified plaque over the thoracic aorta. Ascending thoracic aorta measures 3.4 cm in AP diameter. Mediastinum/Nodes: No evidence of mediastinal or hilar adenopathy. Remaining mediastinal structures are within normal. Lungs/Pleura: Lungs are well  inflated demonstrate mild to moderate centrilobular emphysematous disease. There is mild bibasilar fibrotic change which has progressed compared to the previous exam. Tiny amount of left pleural fluid. Mild right base atelectasis. Airways within normal. Musculoskeletal: Degenerative change of the spine. CT ABDOMEN PELVIS FINDINGS Hepatobiliary: Gallbladder is contracted.  Liver is within normal. Pancreas: Within normal. Spleen: Within normal. Adrenals/Urinary Tract: Adrenal glands are within normal. Kidneys normal size with multiple bilateral cysts with the largest over the lower pole left kidney measuring 6.6 cm. Ureters are within normal. Bladder demonstrates mild circumferential wall thickening which may be due to underdistention although  can be seen with cystitis. Stomach/Bowel: Stomach is within normal. Small bowel is normal. Appendix is normal. Mild fecal retention throughout the colon. There is wall thickening of the sigmoid colon as it courses by the left adnexa. Minimal stranding of the adjacent pericolonic fat. There is a collection of air and mottled lucency which appears to be abutting the left lateral side of the rectosigmoid colon measuring approximately 3.5 x 4.5 cm as this may represent a redundant loop of rectosigmoid colon versus a contained perforation of the rectosigmoid colon. There is no free peritoneal air visualized. Few small lymph nodes in the left perirectal region. These findings could be due to a chronic diverticulitis with possible contain perforation versus contain perforation of rectosigmoid carcinoma with secondary inflammatory change. Vascular/Lymphatic: Moderate calcified plaque over the abdominal aorta. Reproductive: Suggestion of previous hysterectomy. 3.6 cm oval left adnexal cyst likely ovarian cyst. Right ovary within normal. Musculoskeletal: Moderate degenerative changes of the spine and mild degenerate change of the hips. Mild biphasic curvature of the spine. IMPRESSION: No  acute findings in the chest. Moderate emphysematous disease with mild bibasilar fibrotic change demonstrating interval progression. Wall thickening of the rectosigmoid colon with mild adjacent inflammatory change and minimal associated adjacent small lymph nodes. Collection of air mottled lucency along the left lateral aspect of the rectosigmoid colon measuring 3.5 x 4.5 cm. This collection may represent a redundant rectosigmoid loop versus an extraluminal contained perforation either from colon carcinoma or chronic diverticulitis. Consider delayed images as better colonic contrast may help in interpretation of this finding. Mild cardiomegaly and evidence of atherosclerotic coronary artery disease. Aortic atherosclerosis. Bilateral renal cysts with the largest measuring 6.6 cm over the lower pole left kidney. Next Possible 3.6 cm left ovarian cyst. Electronically Signed: By: Marin Olp M.D. On: 11/16/2015 16:42   Ct Chest Wo Contrast  Addendum Date: 11/16/2015   ADDENDUM REPORT: 11/16/2015 16:51 ADDENDUM: These results were called by telephone at the time of interpretation on 11/16/2015 at 4:51 pm to Dr. Oren Binet , who verbally acknowledged these results. Electronically Signed   By: Marin Olp M.D.   On: 11/16/2015 16:51   Result Date: 11/16/2015 CLINICAL DATA:  Fever overnight. EXAM: CT CHEST, ABDOMEN AND PELVIS WITHOUT CONTRAST TECHNIQUE: Multidetector CT imaging of the chest, abdomen and pelvis was performed following the standard protocol without IV contrast. COMPARISON:  Chest CT 03/19/2009 FINDINGS: CT CHEST FINDINGS Cardiovascular: Mild cardiomegaly. Calcified plaque over the left anterior descending and lateral circumflex coronary arteries. Calcified plaque over the thoracic aorta. Ascending thoracic aorta measures 3.4 cm in AP diameter. Mediastinum/Nodes: No evidence of mediastinal or hilar adenopathy. Remaining mediastinal structures are within normal. Lungs/Pleura: Lungs are well  inflated demonstrate mild to moderate centrilobular emphysematous disease. There is mild bibasilar fibrotic change which has progressed compared to the previous exam. Tiny amount of left pleural fluid. Mild right base atelectasis. Airways within normal. Musculoskeletal: Degenerative change of the spine. CT ABDOMEN PELVIS FINDINGS Hepatobiliary: Gallbladder is contracted.  Liver is within normal. Pancreas: Within normal. Spleen: Within normal. Adrenals/Urinary Tract: Adrenal glands are within normal. Kidneys normal size with multiple bilateral cysts with the largest over the lower pole left kidney measuring 6.6 cm. Ureters are within normal. Bladder demonstrates mild circumferential wall thickening which may be due to underdistention although can be seen with cystitis. Stomach/Bowel: Stomach is within normal. Small bowel is normal. Appendix is normal. Mild fecal retention throughout the colon. There is wall thickening of the sigmoid colon as it courses by the  left adnexa. Minimal stranding of the adjacent pericolonic fat. There is a collection of air and mottled lucency which appears to be abutting the left lateral side of the rectosigmoid colon measuring approximately 3.5 x 4.5 cm as this may represent a redundant loop of rectosigmoid colon versus a contained perforation of the rectosigmoid colon. There is no free peritoneal air visualized. Few small lymph nodes in the left perirectal region. These findings could be due to a chronic diverticulitis with possible contain perforation versus contain perforation of rectosigmoid carcinoma with secondary inflammatory change. Vascular/Lymphatic: Moderate calcified plaque over the abdominal aorta. Reproductive: Suggestion of previous hysterectomy. 3.6 cm oval left adnexal cyst likely ovarian cyst. Right ovary within normal. Musculoskeletal: Moderate degenerative changes of the spine and mild degenerate change of the hips. Mild biphasic curvature of the spine. IMPRESSION: No  acute findings in the chest. Moderate emphysematous disease with mild bibasilar fibrotic change demonstrating interval progression. Wall thickening of the rectosigmoid colon with mild adjacent inflammatory change and minimal associated adjacent small lymph nodes. Collection of air mottled lucency along the left lateral aspect of the rectosigmoid colon measuring 3.5 x 4.5 cm. This collection may represent a redundant rectosigmoid loop versus an extraluminal contained perforation either from colon carcinoma or chronic diverticulitis. Consider delayed images as better colonic contrast may help in interpretation of this finding. Mild cardiomegaly and evidence of atherosclerotic coronary artery disease. Aortic atherosclerosis. Bilateral renal cysts with the largest measuring 6.6 cm over the lower pole left kidney. Next Possible 3.6 cm left ovarian cyst. Electronically Signed: By: Marin Olp M.D. On: 11/16/2015 16:42    Assessment: Ill-defined probable inflammatory process with fluid collection in the setting of persistent culture-negative diarrhea and fever Plan:  Will discuss with interventional radiology any other imaging studies that might conclusively determine whether this is a extraluminal fluid collection or simply redundant rectosigmoid loop. If it is the former, would probably proceed with IR drainage and culture before endoscopic evaluation. If not certain this is not sure luminal fluid collection or otherwise not felt candidate for percutaneous drainage we can proceed with sigmoidoscopy. Elmyra Banwart C 11/17/2015, 10:13 AM  Pager [phone] If no answer or after 5 PM call [phone]

## 2015-11-17 NOTE — Progress Notes (Addendum)
PROGRESS NOTE    Dawn [name]  GGE:[id number] DOB: [birth year] DOA: 11/10/2015 PCP: [name], MD   Brief Narrative: Patient is a 75 y.o. female with past medical history of anxiety and depression, recently treated in the outpatient setting for a UTI with Macrobid, admitted for evaluation of acute encephalopathy. Per family, patient has been lethargic, with no significant oral intake for at least 2 weeks prior to this admission. She is also had some intermittent nausea,vomiting and diarrhea. She was found to be febrile, with low blood pressures and admitted to the hospital with empiric antibiotics and aggressive IV fluid resuscitation. She has now stabilized, however continues to have fever. Culture data and stool studies are unrevealing so far. Hospital course complicated by development of SVT , and new diagnosis of hyperthyroidism See below for further details  Subjective: Diarrhea better-had only 2 small bowel movements yesterday. Mild SOB with activity. Is awake and alert this morning  No chest pain.  Assessment/Plan: Principal Problem: Acute encephalopathy: Resolved.Likely metabolic encephalopathy in a setting of sepsis, dehydration and hyperthyroidism. Note, CT head on 10/31 negative for acute abnormalities.  Active Problems: Hypotension: Resolved, etiology initially thought to be sepsis, but could have been from hypovolemia from diarrhea. Follow.  ?Sepsis: Fever and hypotension during the early part of her hospital stay-initially thought to be sepsis due to UTI or diarrhea-and treated with vancomycin and Rocephin. Stool studies negative, blood cultures negative (one set of blood culture positive for coag-negative staph-thought to be a contaminant). Infectious disease was consulted, all antibiotics were discontinued on 11/3, she was doing well until 11/6 when she had a fever of 102.8. This prompted a CT scan of her chest and abdomen, which showed a 3.54.5 cm collection just  lateral to the rectosigmoid. After discussion with GI, we'll proceed with CT of the abdomen with oral contrast today. GI is contemplating a flexible sigmoidoscopy. IR was consulted for CT-guided drainage, and currently recommended repeating CT scan of the abdomen with oral contrast and GI evaluation first. Discussed with infectious disease, recommendations are to continue to monitor off antimicrobial therapy.  SVT: Likely provoked by acute illness and newly diagnosed hyperthyroidism. Given improvement in her blood pressure, cardiology slowly optimizing dose of metoprolol. . Echo with EF around 45-50%.   Acute kidney injury: Resolved. Likely mild prerenal azotemia setting of sepsis/UTI/poor oral intake.   Hyperthyroidism:  New diagnoses-likely the cause of diarrhea/hypovolumia and SVT-not sure if all of her clinical presentation can be attributed to apathetic hyperthyroidism. Started on Tapazole-and metoprolol. Will need endocrinology follow-up as outpatient-have scheduled outpatient Endo follow up on 11/13.  Diarrhea: Continues-but much better-probably related to hyperthyroidism. Stool studies looking C. difficile and GI pathogen panel are negative.Continue scheduled Imodium and add a probiotic. Currently started on Tapazole-hopefully diarrhea was slowly abate with continued treatment of underlying hyperthyroidism.   Anemia: Appears to have chronic anemia at baseline-suspect acute drop in hemoglobin due to IV fluid dilution, acute illness. Transfused 1 unit of PRBC on 11/1-hemoglobin stable.Her shortness of breath is improving after initiation of Lasix.  Continue to follow hemoglobin periodically.   Mild Vol Overload: likely 2/2 aggressive IVF resuscitation->5L + balance-weight is up to 136 lb-IVF stopped on 11/3-will give Lasix 20 mg IV today as well-NO signs of congestive heart failure on exam.  Anxiety and depression: Continue Xanax and resume Wellbutrin.  History of chronic back pain: Have  resumed narcotics as no longer with hypotension and lethargy.  Chronic hypotension: Per family (daughter) patient's blood pressure is mostly in  the 77O systolic range-random cortisol levels appear appropriate-  DVT Prophylaxis: Prophylactic Lovenox  Code Status: Full code   Family Communication: Left message for patient's daughter-no family at bedside   Addendum 5 pm-spoke with daughter over the phone-updated her regarding new developments  Disposition Plan: Remain inpatient-home in the next few days  Antimicrobial agents: Vanco 11/1>>11/3 Flagyl 11/1>>11/2 Rocephin 10/31>>11/3  Procedures: None  CONSULTS: PCCM Cards   Objective: Vitals:   11/16/15 2131 11/16/15 2138 11/17/15 0512 11/17/15 0938  BP: 107/64 107/64 (!) 98/59 (!) 106/56  Pulse: (!) 139 (!) 138 76 92  Resp:  (!) 24 18   Temp:   97.7 F (36.5 C) 99 F (37.2 C)  TempSrc:   Oral Oral  SpO2:  92% 93% 90%  Weight:   61.4 kg (135 lb 5.8 oz)   Height:        Intake/Output Summary (Last 24 hours) at 11/17/15 1139 Last data filed at 11/17/15 0846  Gross per 24 hour  Intake              600 ml  Output             1350 ml  Net             -750 ml   Filed Weights   11/15/15 0105 11/16/15 0300 11/17/15 0512  Weight: 60.9 kg (134 lb 4.8 oz) 62.2 kg (137 lb 2 oz) 61.4 kg (135 lb 5.8 oz)    Examination:  General exam: Appears calm and comfortable  Respiratory system: Clear to auscultation. Respiratory effort normal. Neck:suppler-non tender thyroid Cardiovascular system: S1 & S2 heard, RRR. No JVD, murmurs, rubs, gallops or clicks. No pedal edema. Gastrointestinal system: Abdomen is nondistended, soft and nontender. No organomegaly or masses felt. Central nervous system: Alert and oriented. No focal neurological deficits. Extremities: Symmetric 5 x 5 power-has trace edema b/l Psychiatry: Judgement and insight appear normal. Mood & affect appropriate.     Data Reviewed: I have personally  reviewed following labs and imaging studies  CBC:  Recent Labs Lab 11/12/15 0455 11/13/15 0129 11/14/15 0556 11/15/15 0541 11/16/15 0933  WBC 7.4 7.5 4.3 5.1 4.4  NEUTROABS 5.6 5.8 2.7  --   --   HGB 8.4* 8.1* 7.3* 8.0* 8.1*  HCT 27.0* 26.3* 23.4* 26.8* 27.1*  MCV 89.1 89.5 91.4 92.7 93.8  PLT 202 243 178 214 242   Basic Metabolic Panel:  Recent Labs Lab 11/12/15 0455 11/13/15 0129 11/14/15 0556 11/15/15 0541 11/16/15 0933 11/17/15 0827  NA 139 139 139 138 136 138  K 4.2 3.6 3.0* 4.0 3.9 4.3  CL 108 106 109 105 105 101  CO2 '25 25 24 27 23 30  '$ GLUCOSE 92 125* 86 97 146* 94  BUN '10 13 11 11 11 11  '$ CREATININE 0.93 1.10* 0.90 1.18* 1.15* 1.14*  CALCIUM 7.6* 7.3* 7.3* 7.8* 8.0* 8.4*  MG 1.5* 2.4 1.9  --   --   --   PHOS 1.9* 3.7 3.2  --   --   --    GFR: Estimated Creatinine Clearance: 40.7 mL/min (by C-G formula based on SCr of 1.14 mg/dL (H)). Liver Function Tests:  Recent Labs Lab 11/11/15 0734 11/12/15 0455 11/13/15 0129 11/14/15 0556  AST 12*  --   --   --   ALT 8*  --   --   --   ALKPHOS 52  --   --   --   BILITOT 0.4  --   --   --  PROT 4.6*  --   --   --   ALBUMIN 1.7* 1.8* 1.7* 1.6*   No results for input(s): LIPASE, AMYLASE in the last 168 hours. No results for input(s): AMMONIA in the last 168 hours. Coagulation Profile: No results for input(s): INR, PROTIME in the last 168 hours. Cardiac Enzymes:  Recent Labs Lab 11/11/15 1805 11/11/15 2245 11/12/15 0455  TROPONINI <0.03 <0.03 <0.03   BNP (last 3 results) No results for input(s): PROBNP in the last 8760 hours. HbA1C: No results for input(s): HGBA1C in the last 72 hours. CBG: No results for input(s): GLUCAP in the last 168 hours. Lipid Profile: No results for input(s): CHOL, HDL, LDLCALC, TRIG, CHOLHDL, LDLDIRECT in the last 72 hours. Thyroid Function Tests: No results for input(s): TSH, T4TOTAL, FREET4, T3FREE, THYROIDAB in the last 72 hours. Anemia Panel: No results for  input(s): VITAMINB12, FOLATE, FERRITIN, TIBC, IRON, RETICCTPCT in the last 72 hours. Sepsis Labs:  Recent Labs Lab 11/11/15 0734 11/11/15 1805 11/12/15 0455 11/13/15 0129 11/14/15 0556  PROCALCITON  --  0.59 0.64 0.38 0.28  LATICACIDVEN 0.8  --   --   --   --     Recent Results (from the past 240 hour(s))  Urine culture     Status: None   Collection Time: 11/10/15  2:30 AM  Result Value Ref Range Status   Specimen Description   Final    URINE, CLEAN CATCH Performed at [facility] Requests NONE  Final   Culture NO GROWTH  Final   Report Status 11/11/2015 FINAL  Final  Culture, blood (Routine X 2) w Reflex to ID Panel     Status: None   Collection Time: 11/10/15  9:15 AM  Result Value Ref Range Status   Specimen Description BLOOD LEFT ARM  Final   Special Requests IN PEDIATRIC BOTTLE 2CC  Final   Culture NO GROWTH 5 DAYS  Final   Report Status 11/15/2015 FINAL  Final  Culture, blood (Routine X 2) w Reflex to ID Panel     Status: Abnormal   Collection Time: 11/10/15  9:20 AM  Result Value Ref Range Status   Specimen Description BLOOD LEFT HAND  Final   Special Requests IN PEDIATRIC BOTTLE 2CC  Final   Culture  Setup Time   Final    AEROBIC BOTTLE ONLY GRAM POSITIVE COCCI IN CLUSTERS CRITICAL RESULT CALLED TO, READ BACK BY AND VERIFIED WITH: GREG ABBOTT,PHARMD '@0019'$  11/12/15 MKELLY,MLT    Culture (A)  Final    STAPHYLOCOCCUS SPECIES (COAGULASE NEGATIVE) THE SIGNIFICANCE OF ISOLATING THIS ORGANISM FROM A SINGLE VENIPUNCTURE CANNOT BE PREDICTED WITHOUT FURTHER CLINICAL AND CULTURE CORRELATION. SUSCEPTIBILITIES AVAILABLE ONLY ON REQUEST.    Report Status 11/13/2015 FINAL  Final  C difficile quick scan w PCR reflex     Status: None   Collection Time: 11/11/15  4:54 PM  Result Value Ref Range Status   C Diff antigen NEGATIVE NEGATIVE Final   C Diff toxin NEGATIVE NEGATIVE Final   C Diff interpretation No C. difficile detected.  Final  Gastrointestinal  Panel by PCR , Stool     Status: None   Collection Time: 11/11/15  4:54 PM  Result Value Ref Range Status   Campylobacter species NOT DETECTED NOT DETECTED Final   Plesimonas shigelloides NOT DETECTED NOT DETECTED Final   Salmonella species NOT DETECTED NOT DETECTED Final   Yersinia enterocolitica NOT DETECTED NOT DETECTED Final   Vibrio species NOT DETECTED NOT  DETECTED Final   Vibrio cholerae NOT DETECTED NOT DETECTED Final   Enteroaggregative E coli (EAEC) NOT DETECTED NOT DETECTED Final   Enteropathogenic E coli (EPEC) NOT DETECTED NOT DETECTED Final   Enterotoxigenic E coli (ETEC) NOT DETECTED NOT DETECTED Final   Shiga like toxin producing E coli (STEC) NOT DETECTED NOT DETECTED Final   Shigella/Enteroinvasive E coli (EIEC) NOT DETECTED NOT DETECTED Final   Cryptosporidium NOT DETECTED NOT DETECTED Final   Cyclospora cayetanensis NOT DETECTED NOT DETECTED Final   Entamoeba histolytica NOT DETECTED NOT DETECTED Final   Giardia lamblia NOT DETECTED NOT DETECTED Final   Adenovirus F40/41 NOT DETECTED NOT DETECTED Final   Astrovirus NOT DETECTED NOT DETECTED Final   Norovirus GI/GII NOT DETECTED NOT DETECTED Final   Rotavirus A NOT DETECTED NOT DETECTED Final   Sapovirus (I, II, IV, and V) NOT DETECTED NOT DETECTED Final  MRSA PCR Screening     Status: None   Collection Time: 11/11/15 11:43 PM  Result Value Ref Range Status   MRSA by PCR NEGATIVE NEGATIVE Final    Comment:        The GeneXpert MRSA Assay (FDA approved for NASAL specimens only), is one component of a comprehensive MRSA colonization surveillance program. It is not intended to diagnose MRSA infection nor to guide or monitor treatment for MRSA infections.          Radiology Studies: Ct Abdomen Pelvis Wo Contrast  Addendum Date: 11/16/2015   ADDENDUM REPORT: 11/16/2015 16:51 ADDENDUM: These results were called by telephone at the time of interpretation on 11/16/2015 at 4:51 pm to Dr. Oren Binet , who  verbally acknowledged these results. Electronically Signed   By: Marin Olp M.D.   On: 11/16/2015 16:51   Result Date: 11/16/2015 CLINICAL DATA:  Fever overnight. EXAM: CT CHEST, ABDOMEN AND PELVIS WITHOUT CONTRAST TECHNIQUE: Multidetector CT imaging of the chest, abdomen and pelvis was performed following the standard protocol without IV contrast. COMPARISON:  Chest CT 03/19/2009 FINDINGS: CT CHEST FINDINGS Cardiovascular: Mild cardiomegaly. Calcified plaque over the left anterior descending and lateral circumflex coronary arteries. Calcified plaque over the thoracic aorta. Ascending thoracic aorta measures 3.4 cm in AP diameter. Mediastinum/Nodes: No evidence of mediastinal or hilar adenopathy. Remaining mediastinal structures are within normal. Lungs/Pleura: Lungs are well inflated demonstrate mild to moderate centrilobular emphysematous disease. There is mild bibasilar fibrotic change which has progressed compared to the previous exam. Tiny amount of left pleural fluid. Mild right base atelectasis. Airways within normal. Musculoskeletal: Degenerative change of the spine. CT ABDOMEN PELVIS FINDINGS Hepatobiliary: Gallbladder is contracted.  Liver is within normal. Pancreas: Within normal. Spleen: Within normal. Adrenals/Urinary Tract: Adrenal glands are within normal. Kidneys normal size with multiple bilateral cysts with the largest over the lower pole left kidney measuring 6.6 cm. Ureters are within normal. Bladder demonstrates mild circumferential wall thickening which may be due to underdistention although can be seen with cystitis. Stomach/Bowel: Stomach is within normal. Small bowel is normal. Appendix is normal. Mild fecal retention throughout the colon. There is wall thickening of the sigmoid colon as it courses by the left adnexa. Minimal stranding of the adjacent pericolonic fat. There is a collection of air and mottled lucency which appears to be abutting the left lateral side of the rectosigmoid  colon measuring approximately 3.5 x 4.5 cm as this may represent a redundant loop of rectosigmoid colon versus a contained perforation of the rectosigmoid colon. There is no free peritoneal air visualized. Few small lymph nodes in  the left perirectal region. These findings could be due to a chronic diverticulitis with possible contain perforation versus contain perforation of rectosigmoid carcinoma with secondary inflammatory change. Vascular/Lymphatic: Moderate calcified plaque over the abdominal aorta. Reproductive: Suggestion of previous hysterectomy. 3.6 cm oval left adnexal cyst likely ovarian cyst. Right ovary within normal. Musculoskeletal: Moderate degenerative changes of the spine and mild degenerate change of the hips. Mild biphasic curvature of the spine. IMPRESSION: No acute findings in the chest. Moderate emphysematous disease with mild bibasilar fibrotic change demonstrating interval progression. Wall thickening of the rectosigmoid colon with mild adjacent inflammatory change and minimal associated adjacent small lymph nodes. Collection of air mottled lucency along the left lateral aspect of the rectosigmoid colon measuring 3.5 x 4.5 cm. This collection may represent a redundant rectosigmoid loop versus an extraluminal contained perforation either from colon carcinoma or chronic diverticulitis. Consider delayed images as better colonic contrast may help in interpretation of this finding. Mild cardiomegaly and evidence of atherosclerotic coronary artery disease. Aortic atherosclerosis. Bilateral renal cysts with the largest measuring 6.6 cm over the lower pole left kidney. Next Possible 3.6 cm left ovarian cyst. Electronically Signed: By: Marin Olp M.D. On: 11/16/2015 16:42   Ct Chest Wo Contrast  Addendum Date: 11/16/2015   ADDENDUM REPORT: 11/16/2015 16:51 ADDENDUM: These results were called by telephone at the time of interpretation on 11/16/2015 at 4:51 pm to Dr. Oren Binet , who  verbally acknowledged these results. Electronically Signed   By: Marin Olp M.D.   On: 11/16/2015 16:51   Result Date: 11/16/2015 CLINICAL DATA:  Fever overnight. EXAM: CT CHEST, ABDOMEN AND PELVIS WITHOUT CONTRAST TECHNIQUE: Multidetector CT imaging of the chest, abdomen and pelvis was performed following the standard protocol without IV contrast. COMPARISON:  Chest CT 03/19/2009 FINDINGS: CT CHEST FINDINGS Cardiovascular: Mild cardiomegaly. Calcified plaque over the left anterior descending and lateral circumflex coronary arteries. Calcified plaque over the thoracic aorta. Ascending thoracic aorta measures 3.4 cm in AP diameter. Mediastinum/Nodes: No evidence of mediastinal or hilar adenopathy. Remaining mediastinal structures are within normal. Lungs/Pleura: Lungs are well inflated demonstrate mild to moderate centrilobular emphysematous disease. There is mild bibasilar fibrotic change which has progressed compared to the previous exam. Tiny amount of left pleural fluid. Mild right base atelectasis. Airways within normal. Musculoskeletal: Degenerative change of the spine. CT ABDOMEN PELVIS FINDINGS Hepatobiliary: Gallbladder is contracted.  Liver is within normal. Pancreas: Within normal. Spleen: Within normal. Adrenals/Urinary Tract: Adrenal glands are within normal. Kidneys normal size with multiple bilateral cysts with the largest over the lower pole left kidney measuring 6.6 cm. Ureters are within normal. Bladder demonstrates mild circumferential wall thickening which may be due to underdistention although can be seen with cystitis. Stomach/Bowel: Stomach is within normal. Small bowel is normal. Appendix is normal. Mild fecal retention throughout the colon. There is wall thickening of the sigmoid colon as it courses by the left adnexa. Minimal stranding of the adjacent pericolonic fat. There is a collection of air and mottled lucency which appears to be abutting the left lateral side of the rectosigmoid  colon measuring approximately 3.5 x 4.5 cm as this may represent a redundant loop of rectosigmoid colon versus a contained perforation of the rectosigmoid colon. There is no free peritoneal air visualized. Few small lymph nodes in the left perirectal region. These findings could be due to a chronic diverticulitis with possible contain perforation versus contain perforation of rectosigmoid carcinoma with secondary inflammatory change. Vascular/Lymphatic: Moderate calcified plaque over the abdominal aorta. Reproductive:  Suggestion of previous hysterectomy. 3.6 cm oval left adnexal cyst likely ovarian cyst. Right ovary within normal. Musculoskeletal: Moderate degenerative changes of the spine and mild degenerate change of the hips. Mild biphasic curvature of the spine. IMPRESSION: No acute findings in the chest. Moderate emphysematous disease with mild bibasilar fibrotic change demonstrating interval progression. Wall thickening of the rectosigmoid colon with mild adjacent inflammatory change and minimal associated adjacent small lymph nodes. Collection of air mottled lucency along the left lateral aspect of the rectosigmoid colon measuring 3.5 x 4.5 cm. This collection may represent a redundant rectosigmoid loop versus an extraluminal contained perforation either from colon carcinoma or chronic diverticulitis. Consider delayed images as better colonic contrast may help in interpretation of this finding. Mild cardiomegaly and evidence of atherosclerotic coronary artery disease. Aortic atherosclerosis. Bilateral renal cysts with the largest measuring 6.6 cm over the lower pole left kidney. Next Possible 3.6 cm left ovarian cyst. Electronically Signed: By: Marin Olp M.D. On: 11/16/2015 16:42        Scheduled Meds: . acidophilus  1 capsule Oral Daily  . ALPRAZolam  0.5 mg Oral TID  . buPROPion  150 mg Oral Daily  . enoxaparin (LOVENOX) injection  40 mg Subcutaneous Q24H  . feeding supplement  1 Container  Oral TID BM  . furosemide  20 mg Intravenous Once  . gabapentin  600 mg Oral TID  . loperamide  2 mg Oral BID  . methimazole  10 mg Oral BID  . metoprolol tartrate  37.5 mg Oral BID  . oxyCODONE  5 mg Oral Q8H   Continuous Infusions:    LOS: 7 days    Time spent: [place], [facility]   If 7PM-7AM, please contact night-coverage [email or web address] Password [email or web address] 11/17/2015, 11:39 AM

## 2015-11-17 NOTE — NC FL2 (Signed)
[place] LEVEL OF CARE SCREENING TOOL     IDENTIFICATION  Patient Name: Dawn [name] Birthdate: March 18, 1940 Sex: female Admission Date (Current Location): 11/10/2015  [facility] and [place] Number:  Herbalist and Address:  [facility], [place] [address], [place], [place] [address]      Provider Number: [id number]  Attending Physician Name and Address:  Jonetta Osgood, MD  Relative Name and Phone Number:       Current Level of Care: Hospital Recommended Level of Care: [place] Prior Approval Number:    Date Approved/Denied:   PASRR Number:    Discharge Plan: SNF    Current Diagnoses: Patient Active Problem List   Diagnosis Date Noted  . Intra-abdominal fluid collection   . Premature atrial contractions   . Functional diarrhea   . Sepsis ([place])   . Hyperthyroidism   . Hypotension   . Septic shock ([place])   . Acute encephalopathy   . Hypokalemia   . Acute delirium 11/10/2015  . Acute kidney injury ([place]) 11/10/2015  . Normocytic anemia 11/10/2015  . Anxiety and depression 11/10/2015  . Chronic pain syndrome 11/10/2015  . Recurrent UTI 11/10/2015  . Acute respiratory failure with hypoxia ([place]) 11/10/2015  . COPD (chronic obstructive pulmonary disease) ([place]) 11/10/2015  . Acute hyperglycemia 11/10/2015  . CKD (chronic kidney disease) stage 3, GFR 30-59 ml/min 11/10/2015  . Narrow complex tachycardia ([place]) 11/10/2015  . HTN (hypertension) 11/10/2015  . Generalized weakness 11/10/2015  . Anemia   . Delirium   . Hypoxemia   . Tachycardia     Orientation RESPIRATION BLADDER Height & Weight     Self, Situation, Place  Normal Continent Weight: 135 lb 5.8 oz (61.4 kg) Height:  '5\' 7"'$  (170.2 cm)  BEHAVIORAL SYMPTOMS/MOOD NEUROLOGICAL BOWEL NUTRITION STATUS      Continent Diet (see DC summary)  AMBULATORY STATUS COMMUNICATION OF NEEDS Skin   Limited Assist Verbally Normal                        Personal Care Assistance Level of Assistance  Bathing, Dressing Bathing Assistance: Limited assistance   Dressing Assistance: Limited assistance     Functional Limitations Info             SPECIAL CARE FACTORS FREQUENCY  PT (By licensed PT), OT (By licensed OT)     PT Frequency: 5/wk OT Frequency: 5/wk            Contractures      Additional Factors Info  Code Status, Allergies, Psychotropic Code Status Info: FULL Allergies Info: NKA Psychotropic Info: xanax, wellbutrin         Current Medications (11/17/2015):  This is the current hospital active medication list Current Facility-Administered Medications  Medication Dose Route Frequency Provider Last Rate Last Dose  . acetaminophen (TYLENOL) tablet 650 mg  650 mg Oral Q4H PRN Rhetta Mura Schorr, NP   650 mg at 11/16/15 2131  . acidophilus (RISAQUAD) capsule 1 capsule  1 capsule Oral Daily Jonetta Osgood, MD   1 capsule at 11/17/15 1009  . ALPRAZolam Duanne Moron) tablet 0.5 mg  0.5 mg Oral TID Jonetta Osgood, MD   0.5 mg at 11/17/15 1008  . buPROPion (WELLBUTRIN XL) 24 hr tablet 150 mg  150 mg Oral Daily Jonetta Osgood, MD   150 mg at 11/17/15 1009  . enoxaparin (LOVENOX) injection 40 mg  40 mg Subcutaneous Q24H  [employer], RPH   40 mg at 11/17/15 1007  . feeding supplement (ENSURE ENLIVE) (ENSURE ENLIVE) liquid 237 mL  237 mL Oral BID BM Shanker Kristeen Mans, MD      . furosemide (LASIX) injection 20 mg  20 mg Intravenous Once Jonetta Osgood, MD      . gabapentin (NEURONTIN) tablet 600 mg  600 mg Oral TID Jonetta Osgood, MD   600 mg at 11/17/15 1009  . iopamidol (ISOVUE-300) 61 % injection           . levalbuterol (XOPENEX) nebulizer solution 0.63 mg  0.63 mg Nebulization Q3H PRN Rahul P Desai, PA-C   0.63 mg at 11/13/15 2235  . loperamide (IMODIUM) capsule 2 mg  2 mg Oral PRN Jonetta Osgood, MD   2 mg at 11/15/15 1103  . loperamide (IMODIUM) capsule 2 mg  2 mg Oral BID Jonetta Osgood, MD   2  mg at 11/17/15 1008  . methimazole (TAPAZOLE) tablet 10 mg  10 mg Oral BID Jonetta Osgood, MD   10 mg at 11/17/15 1008  . metoprolol tartrate (LOPRESSOR) tablet 37.5 mg  37.5 mg Oral BID Arnoldo Lenis, MD   37.5 mg at 11/17/15 1007  . ondansetron (ZOFRAN) tablet 4 mg  4 mg Oral Q6H PRN Samella Parr, NP       Or  . ondansetron [facility]) injection 4 mg  4 mg Intravenous Q6H PRN Samella Parr, NP      . oxyCODONE (Oxy IR/ROXICODONE) immediate release tablet 5 mg  5 mg Oral Q8H Shanker Kristeen Mans, MD   5 mg at 11/17/15 1009     Discharge Medications: Please see discharge summary for a list of discharge medications.  Relevant Imaging Results:  Relevant Lab Results:   Additional Information SS#: [id number]  Jorge Ny, LCSW

## 2015-11-17 NOTE — Progress Notes (Signed)
Patient Name: Dawn [name] Date of Encounter: 11/17/2015  Primary Cardiologist: Kirk Ruths, M.D.  Hospital Problem List     Principal Problem:   Acute kidney injury [facility]) Active Problems:   Acute delirium   Normocytic anemia   Anxiety and depression   Chronic pain syndrome   Recurrent UTI   Acute respiratory failure with hypoxia (HCC)   COPD (chronic obstructive pulmonary disease) (HCC)   Acute hyperglycemia   CKD (chronic kidney disease) stage 3, GFR 30-59 ml/min   Narrow complex tachycardia (HCC)   HTN (hypertension)   Anemia   Delirium   Hypoxemia   Tachycardia   Generalized weakness   Septic shock (HCC)   Acute encephalopathy   Hypokalemia   Sepsis (HCC)   Hyperthyroidism   Hypotension   Premature atrial contractions   Functional diarrhea     Subjective   No cardiac complaints. Continued abdominal complaints. No recent diarrhea. Concerned about "some abnormality in her stomach".  Inpatient Medications    Scheduled Meds: . acidophilus  1 capsule Oral Daily  . ALPRAZolam  0.5 mg Oral TID  . buPROPion  150 mg Oral Daily  . enoxaparin (LOVENOX) injection  40 mg Subcutaneous Q24H  . feeding supplement  1 Container Oral TID BM  . furosemide  20 mg Intravenous Once  . gabapentin  600 mg Oral TID  . loperamide  2 mg Oral BID  . methimazole  10 mg Oral BID  . metoprolol tartrate  37.5 mg Oral BID  . oxyCODONE  5 mg Oral Q8H   Continuous Infusions:  PRN Meds: acetaminophen, levalbuterol, loperamide, ondansetron **OR** ondansetron (ZOFRAN) IV   Vital Signs    Vitals:   11/16/15 2131 11/16/15 2138 11/17/15 0512 11/17/15 0938  BP: 107/64 107/64 (!) 98/59 (!) 106/56  Pulse: (!) 139 (!) 138 76 92  Resp:  (!) 24 18   Temp:   97.7 F (36.5 C) 99 F (37.2 C)  TempSrc:   Oral Oral  SpO2:  92% 93% 90%  Weight:   135 lb 5.8 oz (61.4 kg)   Height:        Intake/Output Summary (Last 24 hours) at 11/17/15 1204 Last data filed at 11/17/15 0846  Gross per 24 hour  Intake              600 ml  Output              950 ml  Net             -350 ml   Filed Weights   11/15/15 0105 11/16/15 0300 11/17/15 0512  Weight: 134 lb 4.8 oz (60.9 kg) 137 lb 2 oz (62.2 kg) 135 lb 5.8 oz (61.4 kg)    Physical Exam    GEN: Well nourished, well developed, in no acute distress.  HEENT: Grossly normal.  Neck: Supple, no JVD, carotid bruits, or masses. Cardiac: RRR, no murmurs, rubs, or gallops. No clubbing, cyanosis, edema.  Radials/DP/PT 2+ and equal bilaterally.  Respiratory:  Respirations regular and unlabored, clear to auscultation bilaterally. GI: Soft, nontender, nondistended, BS + x 4. MS: no deformity or atrophy. Skin: warm and dry, no rash. Neuro:  Strength and sensation are intact. Psych: AAOx3.  Normal affect.  Labs    CBC  Recent Labs  11/15/15 0541 11/16/15 0933  WBC 5.1 4.4  HGB 8.0* 8.1*  HCT 26.8* 27.1*  MCV 92.7 93.8  PLT 214 741   Basic Metabolic Panel  Recent Labs  11/16/15 0933 11/17/15 0827  NA 136 138  K 3.9 4.3  CL 105 101  CO2 23 30  GLUCOSE 146* 94  BUN 11 11  CREATININE 1.15* 1.14*  CALCIUM 8.0* 8.4*   Liver Function Tests No results for input(s): AST, ALT, ALKPHOS, BILITOT, PROT, ALBUMIN in the last 72 hours. No results for input(s): LIPASE, AMYLASE in the last 72 hours. Cardiac Enzymes No results for input(s): CKTOTAL, CKMB, CKMBINDEX, TROPONINI in the last 72 hours. BNP Invalid input(s): POCBNP D-Dimer No results for input(s): DDIMER in the last 72 hours. Hemoglobin A1C No results for input(s): HGBA1C in the last 72 hours. Fasting Lipid Panel No results for input(s): CHOL, HDL, LDLCALC, TRIG, CHOLHDL, LDLDIRECT in the last 72 hours. Thyroid Function Tests No results for input(s): TSH, T4TOTAL, T3FREE, THYROIDAB in the last 72 hours.  Invalid input(s): FREET3  Telemetry    Sinus rhythm with brief nonsustained runs of atrial tach or junctional tachycardia 140 bpm. - Personally  Reviewed  ECG    No new tracing - Personally Reviewed  Radiology    Ct Abdomen Pelvis Wo Contrast  Addendum Date: 11/16/2015   ADDENDUM REPORT: 11/16/2015 16:51 ADDENDUM: These results were called by telephone at the time of interpretation on 11/16/2015 at 4:51 pm to Dr. Oren Binet , who verbally acknowledged these results. Electronically Signed   By: Marin Olp M.D.   On: 11/16/2015 16:51   Result Date: 11/16/2015 CLINICAL DATA:  Fever overnight. EXAM: CT CHEST, ABDOMEN AND PELVIS WITHOUT CONTRAST TECHNIQUE: Multidetector CT imaging of the chest, abdomen and pelvis was performed following the standard protocol without IV contrast. COMPARISON:  Chest CT 03/19/2009 FINDINGS: CT CHEST FINDINGS Cardiovascular: Mild cardiomegaly. Calcified plaque over the left anterior descending and lateral circumflex coronary arteries. Calcified plaque over the thoracic aorta. Ascending thoracic aorta measures 3.4 cm in AP diameter. Mediastinum/Nodes: No evidence of mediastinal or hilar adenopathy. Remaining mediastinal structures are within normal. Lungs/Pleura: Lungs are well inflated demonstrate mild to moderate centrilobular emphysematous disease. There is mild bibasilar fibrotic change which has progressed compared to the previous exam. Tiny amount of left pleural fluid. Mild right base atelectasis. Airways within normal. Musculoskeletal: Degenerative change of the spine. CT ABDOMEN PELVIS FINDINGS Hepatobiliary: Gallbladder is contracted.  Liver is within normal. Pancreas: Within normal. Spleen: Within normal. Adrenals/Urinary Tract: Adrenal glands are within normal. Kidneys normal size with multiple bilateral cysts with the largest over the lower pole left kidney measuring 6.6 cm. Ureters are within normal. Bladder demonstrates mild circumferential wall thickening which may be due to underdistention although can be seen with cystitis. Stomach/Bowel: Stomach is within normal. Small bowel is normal. Appendix is  normal. Mild fecal retention throughout the colon. There is wall thickening of the sigmoid colon as it courses by the left adnexa. Minimal stranding of the adjacent pericolonic fat. There is a collection of air and mottled lucency which appears to be abutting the left lateral side of the rectosigmoid colon measuring approximately 3.5 x 4.5 cm as this may represent a redundant loop of rectosigmoid colon versus a contained perforation of the rectosigmoid colon. There is no free peritoneal air visualized. Few small lymph nodes in the left perirectal region. These findings could be due to a chronic diverticulitis with possible contain perforation versus contain perforation of rectosigmoid carcinoma with secondary inflammatory change. Vascular/Lymphatic: Moderate calcified plaque over the abdominal aorta. Reproductive: Suggestion of previous hysterectomy. 3.6 cm oval left adnexal cyst likely ovarian cyst. Right ovary within normal. Musculoskeletal: Moderate degenerative changes  of the spine and mild degenerate change of the hips. Mild biphasic curvature of the spine. IMPRESSION: No acute findings in the chest. Moderate emphysematous disease with mild bibasilar fibrotic change demonstrating interval progression. Wall thickening of the rectosigmoid colon with mild adjacent inflammatory change and minimal associated adjacent small lymph nodes. Collection of air mottled lucency along the left lateral aspect of the rectosigmoid colon measuring 3.5 x 4.5 cm. This collection may represent a redundant rectosigmoid loop versus an extraluminal contained perforation either from colon carcinoma or chronic diverticulitis. Consider delayed images as better colonic contrast may help in interpretation of this finding. Mild cardiomegaly and evidence of atherosclerotic coronary artery disease. Aortic atherosclerosis. Bilateral renal cysts with the largest measuring 6.6 cm over the lower pole left kidney. Next Possible 3.6 cm left ovarian  cyst. Electronically Signed: By: Marin Olp M.D. On: 11/16/2015 16:42   Ct Chest Wo Contrast  Addendum Date: 11/16/2015   ADDENDUM REPORT: 11/16/2015 16:51 ADDENDUM: These results were called by telephone at the time of interpretation on 11/16/2015 at 4:51 pm to Dr. Oren Binet , who verbally acknowledged these results. Electronically Signed   By: Marin Olp M.D.   On: 11/16/2015 16:51   Result Date: 11/16/2015 CLINICAL DATA:  Fever overnight. EXAM: CT CHEST, ABDOMEN AND PELVIS WITHOUT CONTRAST TECHNIQUE: Multidetector CT imaging of the chest, abdomen and pelvis was performed following the standard protocol without IV contrast. COMPARISON:  Chest CT 03/19/2009 FINDINGS: CT CHEST FINDINGS Cardiovascular: Mild cardiomegaly. Calcified plaque over the left anterior descending and lateral circumflex coronary arteries. Calcified plaque over the thoracic aorta. Ascending thoracic aorta measures 3.4 cm in AP diameter. Mediastinum/Nodes: No evidence of mediastinal or hilar adenopathy. Remaining mediastinal structures are within normal. Lungs/Pleura: Lungs are well inflated demonstrate mild to moderate centrilobular emphysematous disease. There is mild bibasilar fibrotic change which has progressed compared to the previous exam. Tiny amount of left pleural fluid. Mild right base atelectasis. Airways within normal. Musculoskeletal: Degenerative change of the spine. CT ABDOMEN PELVIS FINDINGS Hepatobiliary: Gallbladder is contracted.  Liver is within normal. Pancreas: Within normal. Spleen: Within normal. Adrenals/Urinary Tract: Adrenal glands are within normal. Kidneys normal size with multiple bilateral cysts with the largest over the lower pole left kidney measuring 6.6 cm. Ureters are within normal. Bladder demonstrates mild circumferential wall thickening which may be due to underdistention although can be seen with cystitis. Stomach/Bowel: Stomach is within normal. Small bowel is normal. Appendix is normal.  Mild fecal retention throughout the colon. There is wall thickening of the sigmoid colon as it courses by the left adnexa. Minimal stranding of the adjacent pericolonic fat. There is a collection of air and mottled lucency which appears to be abutting the left lateral side of the rectosigmoid colon measuring approximately 3.5 x 4.5 cm as this may represent a redundant loop of rectosigmoid colon versus a contained perforation of the rectosigmoid colon. There is no free peritoneal air visualized. Few small lymph nodes in the left perirectal region. These findings could be due to a chronic diverticulitis with possible contain perforation versus contain perforation of rectosigmoid carcinoma with secondary inflammatory change. Vascular/Lymphatic: Moderate calcified plaque over the abdominal aorta. Reproductive: Suggestion of previous hysterectomy. 3.6 cm oval left adnexal cyst likely ovarian cyst. Right ovary within normal. Musculoskeletal: Moderate degenerative changes of the spine and mild degenerate change of the hips. Mild biphasic curvature of the spine. IMPRESSION: No acute findings in the chest. Moderate emphysematous disease with mild bibasilar fibrotic change demonstrating interval progression. Wall thickening  of the rectosigmoid colon with mild adjacent inflammatory change and minimal associated adjacent small lymph nodes. Collection of air mottled lucency along the left lateral aspect of the rectosigmoid colon measuring 3.5 x 4.5 cm. This collection may represent a redundant rectosigmoid loop versus an extraluminal contained perforation either from colon carcinoma or chronic diverticulitis. Consider delayed images as better colonic contrast may help in interpretation of this finding. Mild cardiomegaly and evidence of atherosclerotic coronary artery disease. Aortic atherosclerosis. Bilateral renal cysts with the largest measuring 6.6 cm over the lower pole left kidney. Next Possible 3.6 cm left ovarian cyst.  Electronically Signed: By: Marin Olp M.D. On: 11/16/2015 16:42    Cardiac Studies   No new data  Patient Profile     75 year old admitted with shock, encephalopathy, UTI, and noted to have supraventricular tachycardia. Also had severe anemia. Also noted to have hyperthyroidism, COPD, and diarrheal illness. Heart rhythm has improved with oral titration of beta blocker therapy.  Assessment & Plan    1. SVT  Still with short set terminating runs of SVT and occasional PVCs . No sustained arrhythmia.Marland Kitchen Rhythm has come under relatively good control with beta blocker titration. No further upward titration is possible due to relatively low blood pressure. Continue current dose and follow clinically.  Signed, Sinclair Grooms, MD  11/17/2015, 12:04 PM

## 2015-11-18 ENCOUNTER — Encounter (HOSPITAL_COMMUNITY)
Admission: EM | Disposition: A | Discharge status: Skilled Nursing Facility | Payer: Self-pay | Source: Home / Self Care | Attending: Internal Medicine

## 2015-11-18 ENCOUNTER — Encounter (HOSPITAL_COMMUNITY): Payer: Self-pay | Admitting: *Deleted

## 2015-11-18 DIAGNOSIS — E43 Unspecified severe protein-calorie malnutrition: Secondary | ICD-10-CM | POA: Insufficient documentation

## 2015-11-18 DIAGNOSIS — I9589 Other hypotension: Secondary | ICD-10-CM

## 2015-11-18 HISTORY — PX: FLEXIBLE SIGMOIDOSCOPY: SHX5431

## 2015-11-18 LAB — BASIC METABOLIC PANEL
Anion gap: 9 (ref 5–15)
BUN: 17 mg/dL (ref 4–21)
BUN: 17 mg/dL (ref 6–20)
CALCIUM: 8.3 mg/dL — AB (ref 8.9–10.3)
CO2: 29 mmol/L (ref 22–32)
CREATININE: 1.39 mg/dL — AB (ref 0.44–1.00)
Chloride: 97 mmol/L — ABNORMAL LOW (ref 101–111)
Creatinine: 1.4 mg/dL — AB (ref 0.5–1.1)
GFR calc non Af Amer: 36 mL/min — ABNORMAL LOW (ref >60)
GFR, EST AFRICAN AMERICAN: 42 mL/min — AB (ref >60)
Glucose, Bld: 107 mg/dL — ABNORMAL HIGH (ref 65–99)
Glucose: 107 mg/dL
Potassium: 4.6 mmol/L (ref 3.4–5.3)
Potassium: 4.6 mmol/L (ref 3.5–5.1)
SODIUM: 135 mmol/L (ref 135–145)
Sodium: 135 mmol/L — AB (ref 137–147)

## 2015-11-18 LAB — CBC AND DIFFERENTIAL
HEMATOCRIT: 27 % — AB (ref 36–46)
Hemoglobin: 8.2 g/dL — AB (ref 12.0–16.0)
PLATELETS: 239 10*3/uL (ref 150–399)
WBC: 5.3 10^3/mL

## 2015-11-18 LAB — CBC
HCT: 26.6 % — ABNORMAL LOW (ref 36.0–46.0)
Hemoglobin: 8.2 g/dL — ABNORMAL LOW (ref 12.0–15.0)
MCH: 28.8 pg (ref 26.0–34.0)
MCHC: 30.8 g/dL (ref 30.0–36.0)
MCV: 93.3 fL (ref 78.0–100.0)
PLATELETS: 239 10*3/uL (ref 150–400)
RBC: 2.85 MIL/uL — ABNORMAL LOW (ref 3.87–5.11)
RDW: 16.9 % — AB (ref 11.5–15.5)
WBC: 5.3 10*3/uL (ref 4.0–10.5)

## 2015-11-18 SURGERY — SIGMOIDOSCOPY, FLEXIBLE
Laterality: Left

## 2015-11-18 MED ORDER — FLEET ENEMA 7-19 GM/118ML RE ENEM
1.0000 | ENEMA | Freq: Once | RECTAL | Status: AC
  Administered 2015-11-18: 1 via RECTAL
  Filled 2015-11-18: qty 1

## 2015-11-18 MED ORDER — MAGNESIUM CITRATE PO SOLN
1.0000 | Freq: Once | ORAL | Status: AC
  Administered 2015-11-18: 1 via ORAL
  Filled 2015-11-18: qty 296

## 2015-11-18 MED ORDER — METOPROLOL TARTRATE 25 MG PO TABS
25.0000 mg | ORAL_TABLET | Freq: Two times a day (BID) | ORAL | Status: DC
  Administered 2015-11-19 – 2015-11-20 (×3): 25 mg via ORAL
  Filled 2015-11-18 (×4): qty 1

## 2015-11-18 MED ORDER — SODIUM CHLORIDE 0.9 % IV SOLN
INTRAVENOUS | Status: DC
  Administered 2015-11-18 (×2): via INTRAVENOUS

## 2015-11-18 NOTE — Care Management Important Message (Signed)
Important Message  Patient Details  Name: Dawn [name] MRN: [id number] Date of Birth: [birth year]   Medicare Important Message Given:  Yes    Nathen May 11/18/2015, 10:48 AM

## 2015-11-18 NOTE — Progress Notes (Signed)
[facility] for Infectious Disease   Reason for visit: Follow up on fever  Interval History: afebrile; no new complaints; on phone   Physical Exam: Constitutional:  Vitals:   11/18/15 0720 11/18/15 1012  BP: (!) 91/59 (!) 82/48  Pulse: 63 73  Resp:    Temp:     patient appears in NAD Eyes: anicteric Respiratory: Normal respiratory effort; CTA B Skin: no rashes   Review of Systems: Gastrointestinal: positive for diarrhea, negative for nausea Musculoskeletal: negative for myalgias and arthralgias  Lab Results  Component Value Date   WBC 5.3 11/18/2015   HGB 8.2 (L) 11/18/2015   HCT 26.6 (L) 11/18/2015   MCV 93.3 11/18/2015   PLT 239 11/18/2015    Lab Results  Component Value Date   CREATININE 1.39 (H) 11/18/2015   BUN 17 11/18/2015   NA 135 11/18/2015   K 4.6 11/18/2015   CL 97 (L) 11/18/2015   CO2 29 11/18/2015    Lab Results  Component Value Date   ALT 8 (L) 11/11/2015   AST 12 (L) 11/11/2015   ALKPHOS 52 11/11/2015     Microbiology: Recent Results (from the past 240 hour(s))  Urine culture     Status: None   Collection Time: 11/10/15  2:30 AM  Result Value Ref Range Status   Specimen Description   Final    URINE, CLEAN CATCH Performed at [facility] Requests NONE  Final   Culture NO GROWTH  Final   Report Status 11/11/2015 FINAL  Final  Culture, blood (Routine X 2) w Reflex to ID Panel     Status: None   Collection Time: 11/10/15  9:15 AM  Result Value Ref Range Status   Specimen Description BLOOD LEFT ARM  Final   Special Requests IN PEDIATRIC BOTTLE 2CC  Final   Culture NO GROWTH 5 DAYS  Final   Report Status 11/15/2015 FINAL  Final  Culture, blood (Routine X 2) w Reflex to ID Panel     Status: Abnormal   Collection Time: 11/10/15  9:20 AM  Result Value Ref Range Status   Specimen Description BLOOD LEFT HAND  Final   Special Requests IN PEDIATRIC BOTTLE 2CC  Final   Culture  Setup Time   Final    AEROBIC BOTTLE  ONLY GRAM POSITIVE COCCI IN CLUSTERS CRITICAL RESULT CALLED TO, READ BACK BY AND VERIFIED WITH: GREG ABBOTT,PHARMD '@0019'$  11/12/15 MKELLY,MLT    Culture (A)  Final    STAPHYLOCOCCUS SPECIES (COAGULASE NEGATIVE) THE SIGNIFICANCE OF ISOLATING THIS ORGANISM FROM A SINGLE VENIPUNCTURE CANNOT BE PREDICTED WITHOUT FURTHER CLINICAL AND CULTURE CORRELATION. SUSCEPTIBILITIES AVAILABLE ONLY ON REQUEST.    Report Status 11/13/2015 FINAL  Final  C difficile quick scan w PCR reflex     Status: None   Collection Time: 11/11/15  4:54 PM  Result Value Ref Range Status   C Diff antigen NEGATIVE NEGATIVE Final   C Diff toxin NEGATIVE NEGATIVE Final   C Diff interpretation No C. difficile detected.  Final  Gastrointestinal Panel by PCR , Stool     Status: None   Collection Time: 11/11/15  4:54 PM  Result Value Ref Range Status   Campylobacter species NOT DETECTED NOT DETECTED Final   Plesimonas shigelloides NOT DETECTED NOT DETECTED Final   Salmonella species NOT DETECTED NOT DETECTED Final   Yersinia enterocolitica NOT DETECTED NOT DETECTED Final   Vibrio species NOT DETECTED NOT DETECTED Final   Vibrio cholerae  NOT DETECTED NOT DETECTED Final   Enteroaggregative E coli (EAEC) NOT DETECTED NOT DETECTED Final   Enteropathogenic E coli (EPEC) NOT DETECTED NOT DETECTED Final   Enterotoxigenic E coli (ETEC) NOT DETECTED NOT DETECTED Final   Shiga like toxin producing E coli (STEC) NOT DETECTED NOT DETECTED Final   Shigella/Enteroinvasive E coli (EIEC) NOT DETECTED NOT DETECTED Final   Cryptosporidium NOT DETECTED NOT DETECTED Final   Cyclospora cayetanensis NOT DETECTED NOT DETECTED Final   Entamoeba histolytica NOT DETECTED NOT DETECTED Final   Giardia lamblia NOT DETECTED NOT DETECTED Final   Adenovirus F40/41 NOT DETECTED NOT DETECTED Final   Astrovirus NOT DETECTED NOT DETECTED Final   Norovirus GI/GII NOT DETECTED NOT DETECTED Final   Rotavirus A NOT DETECTED NOT DETECTED Final   Sapovirus (I, II,  IV, and V) NOT DETECTED NOT DETECTED Final  MRSA PCR Screening     Status: None   Collection Time: 11/11/15 11:43 PM  Result Value Ref Range Status   MRSA by PCR NEGATIVE NEGATIVE Final    Comment:        The GeneXpert MRSA Assay (FDA approved for NASAL specimens only), is one component of a comprehensive MRSA colonization surveillance program. It is not intended to diagnose MRSA infection nor to guide or monitor treatment for MRSA infections.   Culture, blood (routine x 2)     Status: None (Preliminary result)   Collection Time: 11/16/15  9:35 AM  Result Value Ref Range Status   Specimen Description BLOOD LEFT ARM  Final   Special Requests IN PEDIATRIC BOTTLE 2CC  Final   Culture NO GROWTH 1 DAY  Final   Report Status PENDING  Incomplete  Culture, blood (routine x 2)     Status: None (Preliminary result)   Collection Time: 11/16/15  9:38 AM  Result Value Ref Range Status   Specimen Description BLOOD RIGHT HAND  Final   Special Requests IN PEDIATRIC BOTTLE 1CC  Final   Culture NO GROWTH 1 DAY  Final   Report Status PENDING  Incomplete    Impression/Plan:  1. Fever - no clear cause.  CT noted with oral contrast and nothing definitive.  Sigmoidoscopy today.  Continue off of antibiotics.  2. Hyperthyroid - on methimazole.

## 2015-11-18 NOTE — Progress Notes (Addendum)
PROGRESS NOTE    Dawn [name]  QIW:[id number] DOB: 1940/11/01 DOA: 11/10/2015 PCP: [name], MD     Brief Narrative:  Patient is a 75 y.o.female with past medical history of anxiety and depression, recently treated in the outpatient setting for a UTI with Macrobid, admitted for evaluation of acute encephalopathy. Per family, patient has been lethargic, with no significant oral intake for at least 2 weeks prior to this admission. She is also had some intermittent nausea,vomiting and diarrhea. She was found to be febrile, with low blood pressures and admitted to the hospital with empiric antibiotics and aggressive IV fluid resuscitation. She has now stabilized, however continues to have fever. Culture data and stool studies are unrevealing so far. Hospital course complicated by development of SVT and new diagnosis of hyperthyroidism.    Assessment & Plan:   Principal Problem:   Acute kidney injury ([place]) Active Problems:   Acute delirium   Normocytic anemia   Anxiety and depression   Chronic pain syndrome   Recurrent UTI   Acute respiratory failure with hypoxia (HCC)   COPD (chronic obstructive pulmonary disease) (HCC)   Acute hyperglycemia   CKD (chronic kidney disease) stage 3, GFR 30-59 ml/min   Narrow complex tachycardia (HCC)   HTN (hypertension)   Anemia   Delirium   Hypoxemia   Tachycardia   Generalized weakness   Septic shock (HCC)   Acute encephalopathy   Hypokalemia   Sepsis (HCC)   Hyperthyroidism   Hypotension   Premature atrial contractions   Functional diarrhea   Intra-abdominal fluid collection   Protein-calorie malnutrition, severe   Acute encephalopathy: Resolved. Likely metabolic encephalopathy in a setting of sepsis, dehydration and hyperthyroidism. CT head on 10/31 negative for acute abnormalities.  ?Sepsis: Fever and hypotension during the early part of her hospital stay-initially thought to be sepsis due to UTI or diarrhea-and treated  with vancomycin and Rocephin. Stool studies negative, blood cultures negative (one set of blood culture positive for coag-negative staph-thought to be a contaminant). Infectious disease was consulted, all antibiotics were discontinued on 11/3, she was doing well until 11/6 when she had a fever of 102.8. This prompted a CT scan of her chest and abdomen, which showed a 3.54.5 cm collection just lateral to the rectosigmoid. GI and IR have been consulted. Repeat CT with oral contrast 11/7 with similar appearance, redundant loop of colon vs fluid collection. Sigmoidoscopy today.   ERD:EYCXKG provoked by acute illness and newly diagnosed hyperthyroidism. Echo with EF around 45-50%. Continue metoprolol. Cardiology following.   Acute kidney injury:Resolved. Likely mild prerenal azotemia setting of sepsis/UTI/poor oral intake.Cr slightly worse today from yesterday. Could be secondary to contrast use. Continue to monitor BMP.   Hyperthyroidism:New diagnosis. Likely the cause of diarrhea/hypovolumia and SVT. Patient started on Tapazole and metoprolol. Will need to follow up with scheduled outpatient Endo appointment 11/13  Diarrhea: Continues-but much better-probably related to hyperthyroidism. Stool studies looking C. difficile and GI pathogen panel are negative.Continue scheduled Imodium and add a probiotic. Currently started on Tapazole-hopefully diarrhea was slowly abate with continued treatment of underlying hyperthyroidism. Improved diarrhea.   Anemia: Appears to have chronic anemia at baseline-suspect acute drop in hemoglobin due to IV fluid dilution, acute illness. Transfused 1 unit of PRBC on 11/1-hemoglobin stable.  Mild Vol Overload: likely 2/2 aggressive IVF resuscitation->5L + balance-weight is up to 136 lb-IVF stopped on 11/3 and received Lasix 20 mg IV 11/7    Anxiety and depression: Continue Xanax and resume Wellbutrin.  History of chronic back pain:Have resumed narcotics as no  longer with hypotension and lethargy.  Chronic hypotension: Per family (daughter) patient'sblood pressure is mostly in the 42A systolic range-random cortisol levels appear appropriate   DVT prophylaxis: lovenox  Code Status: full Family Communication: spoke with daughter over the phone today Disposition Plan: SNF recommended    Consultants:   PCCM  Cardiology   Infectious disease   GI  IR  Procedures:   None   Antimicrobials:  Vanco 11/1>>11/3 Flagyl 11/1>>11/2 Rocephin 10/31>>11/3    Subjective: Patient feeling well today. No complaints. Denies any fevers, chest pain, heart palpitations, nausea, vomiting, abdominal pain. Having normal BM now.    Objective: Vitals:   11/18/15 0512 11/18/15 0717 11/18/15 0720 11/18/15 1012  BP:  (!) 86/54 (!) 91/59 (!) 82/48  Pulse: 84 78 63 73  Resp:  (!) 22    Temp:      TempSrc:      SpO2: 92% (!) 89% 97%   Weight:      Height:        Intake/Output Summary (Last 24 hours) at 11/18/15 1019 Last data filed at 11/18/15 0600  Gross per 24 hour  Intake              460 ml  Output              450 ml  Net               10 ml   Filed Weights   11/15/15 0105 11/16/15 0300 11/17/15 0512  Weight: 60.9 kg (134 lb 4.8 oz) 62.2 kg (137 lb 2 oz) 61.4 kg (135 lb 5.8 oz)    Examination:  General exam: Appears calm and comfortable  Respiratory system: Clear to auscultation. Respiratory effort normal. Cardiovascular system: S1 & S2 heard, RRR. No JVD, murmurs, rubs, gallops or clicks. Trace pedal edema. Gastrointestinal system: Abdomen is nondistended, soft and nontender. No organomegaly or masses felt. Normal bowel sounds heard. Central nervous system: Alert and oriented. No focal neurological deficits. Extremities: Symmetric 5 x 5 power. Skin: No rashes, lesions or ulcers Psychiatry: Judgement and insight appear normal. Mood & affect appropriate.   Data Reviewed: I have personally reviewed following labs and imaging  studies  CBC:  Recent Labs Lab 11/12/15 0455 11/13/15 0129 11/14/15 0556 11/15/15 0541 11/16/15 0933 11/18/15 0600  WBC 7.4 7.5 4.3 5.1 4.4 5.3  NEUTROABS 5.6 5.8 2.7  --   --   --   HGB 8.4* 8.1* 7.3* 8.0* 8.1* 8.2*  HCT 27.0* 26.3* 23.4* 26.8* 27.1* 26.6*  MCV 89.1 89.5 91.4 92.7 93.8 93.3  PLT 202 243 178 214 194 834   Basic Metabolic Panel:  Recent Labs Lab 11/12/15 0455 11/13/15 0129 11/14/15 0556 11/15/15 0541 11/16/15 0933 11/17/15 0827 11/18/15 0600  NA 139 139 139 138 136 138 135  K 4.2 3.6 3.0* 4.0 3.9 4.3 4.6  CL 108 106 109 105 105 101 97*  CO2 '25 25 24 27 23 30 29  '$ GLUCOSE 92 125* 86 97 146* 94 107*  BUN '10 13 11 11 11 11 17  '$ CREATININE 0.93 1.10* 0.90 1.18* 1.15* 1.14* 1.39*  CALCIUM 7.6* 7.3* 7.3* 7.8* 8.0* 8.4* 8.3*  MG 1.5* 2.4 1.9  --   --   --   --   PHOS 1.9* 3.7 3.2  --   --   --   --    GFR: Estimated Creatinine Clearance: 33.4 mL/min (by C-G formula  based on SCr of 1.39 mg/dL (H)). Liver Function Tests:  Recent Labs Lab 11/12/15 0455 11/13/15 0129 11/14/15 0556  ALBUMIN 1.8* 1.7* 1.6*   No results for input(s): LIPASE, AMYLASE in the last 168 hours. No results for input(s): AMMONIA in the last 168 hours. Coagulation Profile: No results for input(s): INR, PROTIME in the last 168 hours. Cardiac Enzymes:  Recent Labs Lab 11/11/15 1805 11/11/15 2245 11/12/15 0455  TROPONINI <0.03 <0.03 <0.03   BNP (last 3 results) No results for input(s): PROBNP in the last 8760 hours. HbA1C: No results for input(s): HGBA1C in the last 72 hours. CBG: No results for input(s): GLUCAP in the last 168 hours. Lipid Profile: No results for input(s): CHOL, HDL, LDLCALC, TRIG, CHOLHDL, LDLDIRECT in the last 72 hours. Thyroid Function Tests: No results for input(s): TSH, T4TOTAL, FREET4, T3FREE, THYROIDAB in the last 72 hours. Anemia Panel: No results for input(s): VITAMINB12, FOLATE, FERRITIN, TIBC, IRON, RETICCTPCT in the last 72 hours. Sepsis  Labs:  Recent Labs Lab 11/11/15 1805 11/12/15 0455 11/13/15 0129 11/14/15 0556  PROCALCITON 0.59 0.64 0.38 0.28    Recent Results (from the past 240 hour(s))  Urine culture     Status: None   Collection Time: 11/10/15  2:30 AM  Result Value Ref Range Status   Specimen Description   Final    URINE, CLEAN CATCH Performed at [facility] Requests NONE  Final   Culture NO GROWTH  Final   Report Status 11/11/2015 FINAL  Final  Culture, blood (Routine X 2) w Reflex to ID Panel     Status: None   Collection Time: 11/10/15  9:15 AM  Result Value Ref Range Status   Specimen Description BLOOD LEFT ARM  Final   Special Requests IN PEDIATRIC BOTTLE 2CC  Final   Culture NO GROWTH 5 DAYS  Final   Report Status 11/15/2015 FINAL  Final  Culture, blood (Routine X 2) w Reflex to ID Panel     Status: Abnormal   Collection Time: 11/10/15  9:20 AM  Result Value Ref Range Status   Specimen Description BLOOD LEFT HAND  Final   Special Requests IN PEDIATRIC BOTTLE 2CC  Final   Culture  Setup Time   Final    AEROBIC BOTTLE ONLY GRAM POSITIVE COCCI IN CLUSTERS CRITICAL RESULT CALLED TO, READ BACK BY AND VERIFIED WITH: GREG ABBOTT,PHARMD '@0019'$  11/12/15 MKELLY,MLT    Culture (A)  Final    STAPHYLOCOCCUS SPECIES (COAGULASE NEGATIVE) THE SIGNIFICANCE OF ISOLATING THIS ORGANISM FROM A SINGLE VENIPUNCTURE CANNOT BE PREDICTED WITHOUT FURTHER CLINICAL AND CULTURE CORRELATION. SUSCEPTIBILITIES AVAILABLE ONLY ON REQUEST.    Report Status 11/13/2015 FINAL  Final  C difficile quick scan w PCR reflex     Status: None   Collection Time: 11/11/15  4:54 PM  Result Value Ref Range Status   C Diff antigen NEGATIVE NEGATIVE Final   C Diff toxin NEGATIVE NEGATIVE Final   C Diff interpretation No C. difficile detected.  Final  Gastrointestinal Panel by PCR , Stool     Status: None   Collection Time: 11/11/15  4:54 PM  Result Value Ref Range Status   Campylobacter species NOT DETECTED NOT  DETECTED Final   Plesimonas shigelloides NOT DETECTED NOT DETECTED Final   Salmonella species NOT DETECTED NOT DETECTED Final   Yersinia enterocolitica NOT DETECTED NOT DETECTED Final   Vibrio species NOT DETECTED NOT DETECTED Final   Vibrio cholerae NOT DETECTED NOT DETECTED Final  Enteroaggregative E coli (EAEC) NOT DETECTED NOT DETECTED Final   Enteropathogenic E coli (EPEC) NOT DETECTED NOT DETECTED Final   Enterotoxigenic E coli (ETEC) NOT DETECTED NOT DETECTED Final   Shiga like toxin producing E coli (STEC) NOT DETECTED NOT DETECTED Final   Shigella/Enteroinvasive E coli (EIEC) NOT DETECTED NOT DETECTED Final   Cryptosporidium NOT DETECTED NOT DETECTED Final   Cyclospora cayetanensis NOT DETECTED NOT DETECTED Final   Entamoeba histolytica NOT DETECTED NOT DETECTED Final   Giardia lamblia NOT DETECTED NOT DETECTED Final   Adenovirus F40/41 NOT DETECTED NOT DETECTED Final   Astrovirus NOT DETECTED NOT DETECTED Final   Norovirus GI/GII NOT DETECTED NOT DETECTED Final   Rotavirus A NOT DETECTED NOT DETECTED Final   Sapovirus (I, II, IV, and V) NOT DETECTED NOT DETECTED Final  MRSA PCR Screening     Status: None   Collection Time: 11/11/15 11:43 PM  Result Value Ref Range Status   MRSA by PCR NEGATIVE NEGATIVE Final    Comment:        The GeneXpert MRSA Assay (FDA approved for NASAL specimens only), is one component of a comprehensive MRSA colonization surveillance program. It is not intended to diagnose MRSA infection nor to guide or monitor treatment for MRSA infections.   Culture, blood (routine x 2)     Status: None (Preliminary result)   Collection Time: 11/16/15  9:35 AM  Result Value Ref Range Status   Specimen Description BLOOD LEFT ARM  Final   Special Requests IN PEDIATRIC BOTTLE 2CC  Final   Culture NO GROWTH 1 DAY  Final   Report Status PENDING  Incomplete  Culture, blood (routine x 2)     Status: None (Preliminary result)   Collection Time: 11/16/15  9:38 AM   Result Value Ref Range Status   Specimen Description BLOOD RIGHT HAND  Final   Special Requests IN PEDIATRIC BOTTLE 1CC  Final   Culture NO GROWTH 1 DAY  Final   Report Status PENDING  Incomplete       Radiology Studies: Ct Abdomen Pelvis Wo Contrast  Result Date: 11/17/2015 CLINICAL DATA:  Possible diverticular abscess EXAM: CT ABDOMEN AND PELVIS WITHOUT CONTRAST TECHNIQUE: Multidetector CT imaging of the abdomen and pelvis was performed following the standard protocol without IV contrast. COMPARISON:  11/16/2015 FINDINGS: Lower chest: Mild emphysematous changes are noted. No sizable effusion is seen. Hepatobiliary: The gallbladder is decompressed. The liver is within normal limits. Pancreas: Unremarkable. No pancreatic ductal dilatation or surrounding inflammatory changes. Spleen: Normal in size without focal abnormality. Adrenals/Urinary Tract: Adrenals are within normal limits. Stable renal cysts are noted bilaterally. The bladder is partially decompressed. Stomach/Bowel: Scattered diverticular change of the colon is noted. Some sigmoid wall thickening is seen. The mottled air collection is again identified just anterior to the rectum and slightly eccentric to the left. The contrast load in this region is not appreciable. Again this may represent a redundant loop of sigmoid although the possibility of an extraluminal collection remains. Vascular/Lymphatic: Aortic atherosclerosis. No enlarged abdominal or pelvic lymph nodes. Reproductive: Left ovarian cyst is again seen. History ectomy is been previously performed. Other: No abdominal wall hernia or abnormality. No abdominopelvic ascites. Musculoskeletal: No acute or significant osseous findings. IMPRESSION: Stable changes in the region of the rectosigmoid. This may simply represent redundant loop of colon although imaging was performed prior to contrast reaching the distal colon. The overall appearance is stable from the prior study. Electronically  Signed   By: Elta [place]  Lukens M.D.   On: 11/17/2015 19:48   Ct Abdomen Pelvis Wo Contrast  Addendum Date: 11/16/2015   ADDENDUM REPORT: 11/16/2015 16:51 ADDENDUM: These results were called by telephone at the time of interpretation on 11/16/2015 at 4:51 pm to Dr. Oren Binet , who verbally acknowledged these results. Electronically Signed   By: Marin Olp M.D.   On: 11/16/2015 16:51   Result Date: 11/16/2015 CLINICAL DATA:  Fever overnight. EXAM: CT CHEST, ABDOMEN AND PELVIS WITHOUT CONTRAST TECHNIQUE: Multidetector CT imaging of the chest, abdomen and pelvis was performed following the standard protocol without IV contrast. COMPARISON:  Chest CT 03/19/2009 FINDINGS: CT CHEST FINDINGS Cardiovascular: Mild cardiomegaly. Calcified plaque over the left anterior descending and lateral circumflex coronary arteries. Calcified plaque over the thoracic aorta. Ascending thoracic aorta measures 3.4 cm in AP diameter. Mediastinum/Nodes: No evidence of mediastinal or hilar adenopathy. Remaining mediastinal structures are within normal. Lungs/Pleura: Lungs are well inflated demonstrate mild to moderate centrilobular emphysematous disease. There is mild bibasilar fibrotic change which has progressed compared to the previous exam. Tiny amount of left pleural fluid. Mild right base atelectasis. Airways within normal. Musculoskeletal: Degenerative change of the spine. CT ABDOMEN PELVIS FINDINGS Hepatobiliary: Gallbladder is contracted.  Liver is within normal. Pancreas: Within normal. Spleen: Within normal. Adrenals/Urinary Tract: Adrenal glands are within normal. Kidneys normal size with multiple bilateral cysts with the largest over the lower pole left kidney measuring 6.6 cm. Ureters are within normal. Bladder demonstrates mild circumferential wall thickening which may be due to underdistention although can be seen with cystitis. Stomach/Bowel: Stomach is within normal. Small bowel is normal. Appendix is normal. Mild  fecal retention throughout the colon. There is wall thickening of the sigmoid colon as it courses by the left adnexa. Minimal stranding of the adjacent pericolonic fat. There is a collection of air and mottled lucency which appears to be abutting the left lateral side of the rectosigmoid colon measuring approximately 3.5 x 4.5 cm as this may represent a redundant loop of rectosigmoid colon versus a contained perforation of the rectosigmoid colon. There is no free peritoneal air visualized. Few small lymph nodes in the left perirectal region. These findings could be due to a chronic diverticulitis with possible contain perforation versus contain perforation of rectosigmoid carcinoma with secondary inflammatory change. Vascular/Lymphatic: Moderate calcified plaque over the abdominal aorta. Reproductive: Suggestion of previous hysterectomy. 3.6 cm oval left adnexal cyst likely ovarian cyst. Right ovary within normal. Musculoskeletal: Moderate degenerative changes of the spine and mild degenerate change of the hips. Mild biphasic curvature of the spine. IMPRESSION: No acute findings in the chest. Moderate emphysematous disease with mild bibasilar fibrotic change demonstrating interval progression. Wall thickening of the rectosigmoid colon with mild adjacent inflammatory change and minimal associated adjacent small lymph nodes. Collection of air mottled lucency along the left lateral aspect of the rectosigmoid colon measuring 3.5 x 4.5 cm. This collection may represent a redundant rectosigmoid loop versus an extraluminal contained perforation either from colon carcinoma or chronic diverticulitis. Consider delayed images as better colonic contrast may help in interpretation of this finding. Mild cardiomegaly and evidence of atherosclerotic coronary artery disease. Aortic atherosclerosis. Bilateral renal cysts with the largest measuring 6.6 cm over the lower pole left kidney. Next Possible 3.6 cm left ovarian cyst.  Electronically Signed: By: Marin Olp M.D. On: 11/16/2015 16:42   Ct Chest Wo Contrast  Addendum Date: 11/16/2015   ADDENDUM REPORT: 11/16/2015 16:51 ADDENDUM: These results were called by telephone at the time of interpretation  on 11/16/2015 at 4:51 pm to Dr. Oren Binet , who verbally acknowledged these results. Electronically Signed   By: Marin Olp M.D.   On: 11/16/2015 16:51   Result Date: 11/16/2015 CLINICAL DATA:  Fever overnight. EXAM: CT CHEST, ABDOMEN AND PELVIS WITHOUT CONTRAST TECHNIQUE: Multidetector CT imaging of the chest, abdomen and pelvis was performed following the standard protocol without IV contrast. COMPARISON:  Chest CT 03/19/2009 FINDINGS: CT CHEST FINDINGS Cardiovascular: Mild cardiomegaly. Calcified plaque over the left anterior descending and lateral circumflex coronary arteries. Calcified plaque over the thoracic aorta. Ascending thoracic aorta measures 3.4 cm in AP diameter. Mediastinum/Nodes: No evidence of mediastinal or hilar adenopathy. Remaining mediastinal structures are within normal. Lungs/Pleura: Lungs are well inflated demonstrate mild to moderate centrilobular emphysematous disease. There is mild bibasilar fibrotic change which has progressed compared to the previous exam. Tiny amount of left pleural fluid. Mild right base atelectasis. Airways within normal. Musculoskeletal: Degenerative change of the spine. CT ABDOMEN PELVIS FINDINGS Hepatobiliary: Gallbladder is contracted.  Liver is within normal. Pancreas: Within normal. Spleen: Within normal. Adrenals/Urinary Tract: Adrenal glands are within normal. Kidneys normal size with multiple bilateral cysts with the largest over the lower pole left kidney measuring 6.6 cm. Ureters are within normal. Bladder demonstrates mild circumferential wall thickening which may be due to underdistention although can be seen with cystitis. Stomach/Bowel: Stomach is within normal. Small bowel is normal. Appendix is normal. Mild  fecal retention throughout the colon. There is wall thickening of the sigmoid colon as it courses by the left adnexa. Minimal stranding of the adjacent pericolonic fat. There is a collection of air and mottled lucency which appears to be abutting the left lateral side of the rectosigmoid colon measuring approximately 3.5 x 4.5 cm as this may represent a redundant loop of rectosigmoid colon versus a contained perforation of the rectosigmoid colon. There is no free peritoneal air visualized. Few small lymph nodes in the left perirectal region. These findings could be due to a chronic diverticulitis with possible contain perforation versus contain perforation of rectosigmoid carcinoma with secondary inflammatory change. Vascular/Lymphatic: Moderate calcified plaque over the abdominal aorta. Reproductive: Suggestion of previous hysterectomy. 3.6 cm oval left adnexal cyst likely ovarian cyst. Right ovary within normal. Musculoskeletal: Moderate degenerative changes of the spine and mild degenerate change of the hips. Mild biphasic curvature of the spine. IMPRESSION: No acute findings in the chest. Moderate emphysematous disease with mild bibasilar fibrotic change demonstrating interval progression. Wall thickening of the rectosigmoid colon with mild adjacent inflammatory change and minimal associated adjacent small lymph nodes. Collection of air mottled lucency along the left lateral aspect of the rectosigmoid colon measuring 3.5 x 4.5 cm. This collection may represent a redundant rectosigmoid loop versus an extraluminal contained perforation either from colon carcinoma or chronic diverticulitis. Consider delayed images as better colonic contrast may help in interpretation of this finding. Mild cardiomegaly and evidence of atherosclerotic coronary artery disease. Aortic atherosclerosis. Bilateral renal cysts with the largest measuring 6.6 cm over the lower pole left kidney. Next Possible 3.6 cm left ovarian cyst.  Electronically Signed: By: Marin Olp M.D. On: 11/16/2015 16:42      Scheduled Meds: . acidophilus  1 capsule Oral Daily  . ALPRAZolam  0.5 mg Oral TID  . buPROPion  150 mg Oral Daily  . enoxaparin (LOVENOX) injection  40 mg Subcutaneous Q24H  . feeding supplement (ENSURE ENLIVE)  237 mL Oral BID BM  . gabapentin  600 mg Oral TID  . loperamide  2 mg Oral BID  . methimazole  10 mg Oral BID  . metoprolol tartrate  25 mg Oral BID  . oxyCODONE  5 mg Oral Q8H  . sodium phosphate  1 enema Rectal Once   Continuous Infusions: . sodium chloride 125 mL/hr at 11/18/15 1009     LOS: 8 days    Time spent: 40 minutes   Dessa Phi, DO [facility] [email or web address] Password [email or web address] 11/18/2015, 10:19 AM

## 2015-11-18 NOTE — Progress Notes (Addendum)
PT Cancellation Note  Patient Details Name: Dawn [name] MRN: [id number] DOB: 1940-05-10   Cancelled Treatment:    Reason Eval/Treat Not Completed: Patient declined, no reason specified.  Pt adamantly refused treatment despite education/encouragement.  Pt educated on benefits of mobility and verbalizes understanding and remains to refuse treatment.   Bary Castilla 11/18/2015, 5:00 PM   Charting reviewed for accuracy.   Governor Rooks, PTA pager [phone]

## 2015-11-18 NOTE — Progress Notes (Signed)
Spoke to daughter, [name], gave her an update.  Gave her phone number to [place], Therapist, sports, Public house manager. Hand off report given to oncoming RNs.

## 2015-11-18 NOTE — Progress Notes (Signed)
Dawn [name], Patient's Daughter gave verbal consent to Merrily Pew, RN and I over the phone for the Flexible Sigmoidoscopy this afternoon  Dawn [name] did not have any questions for [place] and said all questions were answered by Dr. Amedeo Plenty.

## 2015-11-18 NOTE — Progress Notes (Signed)
Patient Name: Dawn [name] Date of Encounter: 11/18/2015  Primary Cardiologist: Stanford Breed, M.D.  Hospital Problem List     Principal Problem:   Acute kidney injury [facility]) Active Problems:   Acute delirium   Normocytic anemia   Anxiety and depression   Chronic pain syndrome   Recurrent UTI   Acute respiratory failure with hypoxia (HCC)   COPD (chronic obstructive pulmonary disease) (HCC)   Acute hyperglycemia   CKD (chronic kidney disease) stage 3, GFR 30-59 ml/min   Narrow complex tachycardia (HCC)   HTN (hypertension)   Anemia   Delirium   Hypoxemia   Tachycardia   Generalized weakness   Septic shock (HCC)   Acute encephalopathy   Hypokalemia   Sepsis (HCC)   Hyperthyroidism   Hypotension   Premature atrial contractions   Functional diarrhea   Intra-abdominal fluid collection   Protein-calorie malnutrition, severe     Subjective   No particular complaints, especially related to cardiovascular system.  Inpatient Medications    Scheduled Meds: . acidophilus  1 capsule Oral Daily  . ALPRAZolam  0.5 mg Oral TID  . buPROPion  150 mg Oral Daily  . enoxaparin (LOVENOX) injection  40 mg Subcutaneous Q24H  . feeding supplement (ENSURE ENLIVE)  237 mL Oral BID BM  . gabapentin  600 mg Oral TID  . loperamide  2 mg Oral BID  . methimazole  10 mg Oral BID  . metoprolol tartrate  37.5 mg Oral BID  . oxyCODONE  5 mg Oral Q8H  . sodium phosphate  1 enema Rectal Once   Continuous Infusions:  PRN Meds: acetaminophen, levalbuterol, loperamide, ondansetron **OR** ondansetron (ZOFRAN) IV   Vital Signs    Vitals:   11/18/15 0511 11/18/15 0512 11/18/15 0717 11/18/15 0720  BP: (!) 80/53  (!) 86/54 (!) 91/59  Pulse: (!) 151 84 78 63  Resp: 18  (!) 22   Temp: 98.7 F (37.1 C)     TempSrc: Oral     SpO2: 92% 92% (!) 89% 97%  Weight:      Height:        Intake/Output Summary (Last 24 hours) at 11/18/15 0952 Last data filed at 11/18/15 0600  Gross per 24  hour  Intake              460 ml  Output              450 ml  Net               10 ml   Filed Weights   11/15/15 0105 11/16/15 0300 11/17/15 0512  Weight: 134 lb 4.8 oz (60.9 kg) 137 lb 2 oz (62.2 kg) 135 lb 5.8 oz (61.4 kg)    Physical Exam    GEN: Elderly and frail and in no acute distress.  HEENT: Grossly normal.  Neck: Supple, no JVD, carotid bruits, or masses. Cardiac: RRR, no murmurs, rubs, or gallops. No clubbing, cyanosis, edema.  Radials/DP/PT 2+ and equal bilaterally.  Respiratory:  Respirations regular and unlabored, clear to auscultation bilaterally. GI: Soft, nontender, nondistended, BS + x 4. MS: no deformity or atrophy. Skin: warm and dry, no rash. Neuro:  Strength and sensation are intact. Psych: AAOx3.  Normal affect.  Labs    CBC  Recent Labs  11/16/15 0933 11/18/15 0600  WBC 4.4 5.3  HGB 8.1* 8.2*  HCT 27.1* 26.6*  MCV 93.8 93.3  PLT 194 846   Basic Metabolic Panel  Recent Labs  11/17/15 0827 11/18/15 0600  NA 138 135  K 4.3 4.6  CL 101 97*  CO2 30 29  GLUCOSE 94 107*  BUN 11 17  CREATININE 1.14* 1.39*  CALCIUM 8.4* 8.3*     Telemetry    Normal sinus rhythm with occasional runs of SVT that was self terminating. Rates are up to 140 bpm. - Personally Reviewed  ECG    Not repeated - Personally Reviewed  Radiology    Ct Abdomen Pelvis Wo Contrast  Result Date: 11/17/2015 CLINICAL DATA:  Possible diverticular abscess EXAM: CT ABDOMEN AND PELVIS WITHOUT CONTRAST TECHNIQUE: Multidetector CT imaging of the abdomen and pelvis was performed following the standard protocol without IV contrast. COMPARISON:  11/16/2015 FINDINGS: Lower chest: Mild emphysematous changes are noted. No sizable effusion is seen. Hepatobiliary: The gallbladder is decompressed. The liver is within normal limits. Pancreas: Unremarkable. No pancreatic ductal dilatation or surrounding inflammatory changes. Spleen: Normal in size without focal abnormality. Adrenals/Urinary  Tract: Adrenals are within normal limits. Stable renal cysts are noted bilaterally. The bladder is partially decompressed. Stomach/Bowel: Scattered diverticular change of the colon is noted. Some sigmoid wall thickening is seen. The mottled air collection is again identified just anterior to the rectum and slightly eccentric to the left. The contrast load in this region is not appreciable. Again this may represent a redundant loop of sigmoid although the possibility of an extraluminal collection remains. Vascular/Lymphatic: Aortic atherosclerosis. No enlarged abdominal or pelvic lymph nodes. Reproductive: Left ovarian cyst is again seen. History ectomy is been previously performed. Other: No abdominal wall hernia or abnormality. No abdominopelvic ascites. Musculoskeletal: No acute or significant osseous findings. IMPRESSION: Stable changes in the region of the rectosigmoid. This may simply represent redundant loop of colon although imaging was performed prior to contrast reaching the distal colon. The overall appearance is stable from the prior study. Electronically Signed   By: Inez Catalina M.D.   On: 11/17/2015 19:48   Ct Abdomen Pelvis Wo Contrast  Addendum Date: 11/16/2015   ADDENDUM REPORT: 11/16/2015 16:51 ADDENDUM: These results were called by telephone at the time of interpretation on 11/16/2015 at 4:51 pm to Dr. Oren Binet , who verbally acknowledged these results. Electronically Signed   By: Marin Olp M.D.   On: 11/16/2015 16:51   Result Date: 11/16/2015 CLINICAL DATA:  Fever overnight. EXAM: CT CHEST, ABDOMEN AND PELVIS WITHOUT CONTRAST TECHNIQUE: Multidetector CT imaging of the chest, abdomen and pelvis was performed following the standard protocol without IV contrast. COMPARISON:  Chest CT 03/19/2009 FINDINGS: CT CHEST FINDINGS Cardiovascular: Mild cardiomegaly. Calcified plaque over the left anterior descending and lateral circumflex coronary arteries. Calcified plaque over the thoracic  aorta. Ascending thoracic aorta measures 3.4 cm in AP diameter. Mediastinum/Nodes: No evidence of mediastinal or hilar adenopathy. Remaining mediastinal structures are within normal. Lungs/Pleura: Lungs are well inflated demonstrate mild to moderate centrilobular emphysematous disease. There is mild bibasilar fibrotic change which has progressed compared to the previous exam. Tiny amount of left pleural fluid. Mild right base atelectasis. Airways within normal. Musculoskeletal: Degenerative change of the spine. CT ABDOMEN PELVIS FINDINGS Hepatobiliary: Gallbladder is contracted.  Liver is within normal. Pancreas: Within normal. Spleen: Within normal. Adrenals/Urinary Tract: Adrenal glands are within normal. Kidneys normal size with multiple bilateral cysts with the largest over the lower pole left kidney measuring 6.6 cm. Ureters are within normal. Bladder demonstrates mild circumferential wall thickening which may be due to underdistention although can be seen with cystitis. Stomach/Bowel: Stomach is within normal. Small  bowel is normal. Appendix is normal. Mild fecal retention throughout the colon. There is wall thickening of the sigmoid colon as it courses by the left adnexa. Minimal stranding of the adjacent pericolonic fat. There is a collection of air and mottled lucency which appears to be abutting the left lateral side of the rectosigmoid colon measuring approximately 3.5 x 4.5 cm as this may represent a redundant loop of rectosigmoid colon versus a contained perforation of the rectosigmoid colon. There is no free peritoneal air visualized. Few small lymph nodes in the left perirectal region. These findings could be due to a chronic diverticulitis with possible contain perforation versus contain perforation of rectosigmoid carcinoma with secondary inflammatory change. Vascular/Lymphatic: Moderate calcified plaque over the abdominal aorta. Reproductive: Suggestion of previous hysterectomy. 3.6 cm oval left  adnexal cyst likely ovarian cyst. Right ovary within normal. Musculoskeletal: Moderate degenerative changes of the spine and mild degenerate change of the hips. Mild biphasic curvature of the spine. IMPRESSION: No acute findings in the chest. Moderate emphysematous disease with mild bibasilar fibrotic change demonstrating interval progression. Wall thickening of the rectosigmoid colon with mild adjacent inflammatory change and minimal associated adjacent small lymph nodes. Collection of air mottled lucency along the left lateral aspect of the rectosigmoid colon measuring 3.5 x 4.5 cm. This collection may represent a redundant rectosigmoid loop versus an extraluminal contained perforation either from colon carcinoma or chronic diverticulitis. Consider delayed images as better colonic contrast may help in interpretation of this finding. Mild cardiomegaly and evidence of atherosclerotic coronary artery disease. Aortic atherosclerosis. Bilateral renal cysts with the largest measuring 6.6 cm over the lower pole left kidney. Next Possible 3.6 cm left ovarian cyst. Electronically Signed: By: Marin Olp M.D. On: 11/16/2015 16:42   Ct Chest Wo Contrast  Addendum Date: 11/16/2015   ADDENDUM REPORT: 11/16/2015 16:51 ADDENDUM: These results were called by telephone at the time of interpretation on 11/16/2015 at 4:51 pm to Dr. Oren Binet , who verbally acknowledged these results. Electronically Signed   By: Marin Olp M.D.   On: 11/16/2015 16:51   Result Date: 11/16/2015 CLINICAL DATA:  Fever overnight. EXAM: CT CHEST, ABDOMEN AND PELVIS WITHOUT CONTRAST TECHNIQUE: Multidetector CT imaging of the chest, abdomen and pelvis was performed following the standard protocol without IV contrast. COMPARISON:  Chest CT 03/19/2009 FINDINGS: CT CHEST FINDINGS Cardiovascular: Mild cardiomegaly. Calcified plaque over the left anterior descending and lateral circumflex coronary arteries. Calcified plaque over the thoracic  aorta. Ascending thoracic aorta measures 3.4 cm in AP diameter. Mediastinum/Nodes: No evidence of mediastinal or hilar adenopathy. Remaining mediastinal structures are within normal. Lungs/Pleura: Lungs are well inflated demonstrate mild to moderate centrilobular emphysematous disease. There is mild bibasilar fibrotic change which has progressed compared to the previous exam. Tiny amount of left pleural fluid. Mild right base atelectasis. Airways within normal. Musculoskeletal: Degenerative change of the spine. CT ABDOMEN PELVIS FINDINGS Hepatobiliary: Gallbladder is contracted.  Liver is within normal. Pancreas: Within normal. Spleen: Within normal. Adrenals/Urinary Tract: Adrenal glands are within normal. Kidneys normal size with multiple bilateral cysts with the largest over the lower pole left kidney measuring 6.6 cm. Ureters are within normal. Bladder demonstrates mild circumferential wall thickening which may be due to underdistention although can be seen with cystitis. Stomach/Bowel: Stomach is within normal. Small bowel is normal. Appendix is normal. Mild fecal retention throughout the colon. There is wall thickening of the sigmoid colon as it courses by the left adnexa. Minimal stranding of the adjacent pericolonic fat. There is  a collection of air and mottled lucency which appears to be abutting the left lateral side of the rectosigmoid colon measuring approximately 3.5 x 4.5 cm as this may represent a redundant loop of rectosigmoid colon versus a contained perforation of the rectosigmoid colon. There is no free peritoneal air visualized. Few small lymph nodes in the left perirectal region. These findings could be due to a chronic diverticulitis with possible contain perforation versus contain perforation of rectosigmoid carcinoma with secondary inflammatory change. Vascular/Lymphatic: Moderate calcified plaque over the abdominal aorta. Reproductive: Suggestion of previous hysterectomy. 3.6 cm oval left  adnexal cyst likely ovarian cyst. Right ovary within normal. Musculoskeletal: Moderate degenerative changes of the spine and mild degenerate change of the hips. Mild biphasic curvature of the spine. IMPRESSION: No acute findings in the chest. Moderate emphysematous disease with mild bibasilar fibrotic change demonstrating interval progression. Wall thickening of the rectosigmoid colon with mild adjacent inflammatory change and minimal associated adjacent small lymph nodes. Collection of air mottled lucency along the left lateral aspect of the rectosigmoid colon measuring 3.5 x 4.5 cm. This collection may represent a redundant rectosigmoid loop versus an extraluminal contained perforation either from colon carcinoma or chronic diverticulitis. Consider delayed images as better colonic contrast may help in interpretation of this finding. Mild cardiomegaly and evidence of atherosclerotic coronary artery disease. Aortic atherosclerosis. Bilateral renal cysts with the largest measuring 6.6 cm over the lower pole left kidney. Next Possible 3.6 cm left ovarian cyst. Electronically Signed: By: Marin Olp M.D. On: 11/16/2015 16:42    Cardiac Studies   No new data  Patient Profile     75 year old admitted with shock, encephalopathy, UTI, and noted to have supraventricular tachycardia. Also had severe anemia. Also noted to have hyperthyroidism, COPD, and diarrheal illness. Heart rhythm has improved with oral titration of beta blocker therapy  Assessment & Plan    1. 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 Somewhat suppressed with by beta blocker therapy. Limited in dose intensity due to hypotension. Plan to decrease metoprolol to 25 mg twice a day. 2. Hypotension, likely related to volume depletion. She has poor intake. She needs IV fluid. 3. Acute kidney injury, likely due to volume contraction/hypotension.  Signed, Sinclair Grooms, MD  11/18/2015, 9:52 AM

## 2015-11-18 NOTE — Progress Notes (Signed)
Paged on call. Patient's BP is 80/43, HR 151 and Patient just had 8 beat run of VT.  Awaiting call back/ order.

## 2015-11-18 NOTE — Progress Notes (Signed)
CSW provided choice to patient dtr- she will review facilities and discuss further with the patient the idea of going to SNF- no choice at this time  CSW will continue to follow  Jorge Ny, [place] Social Worker [phone]

## 2015-11-18 NOTE — Progress Notes (Signed)
[facility] Progress Note  Subjective: Patient feeling about the same.  Objective: Vital signs in last 24 hours: Temp:  [98.3 F (36.8 C)-99.3 F (37.4 C)] 98.7 F (37.1 C) (11/08 0511) Pulse Rate:  [63-151] 63 (11/08 0720) Resp:  [16-22] 22 (11/08 0717) BP: (80-112)/(53-68) 91/59 (11/08 0720) SpO2:  [89 %-98 %] 97 % (11/08 0720) Weight change:    PE: Unchanged.  Lab Results: Results for orders placed or performed during the hospital encounter of 11/10/15 (from the past 24 hour(s))  CBC     Status: Abnormal   Collection Time: 11/18/15  6:00 AM  Result Value Ref Range   WBC 5.3 4.0 - 10.5 K/uL   RBC 2.85 (L) 3.87 - 5.11 MIL/uL   Hemoglobin 8.2 (L) 12.0 - 15.0 g/dL   HCT 26.6 (L) 36.0 - 46.0 %   MCV 93.3 78.0 - 100.0 fL   MCH 28.8 26.0 - 34.0 pg   MCHC 30.8 30.0 - 36.0 g/dL   RDW 16.9 (H) 11.5 - 15.5 %   Platelets 239 150 - 400 K/uL  Basic metabolic panel     Status: Abnormal   Collection Time: 11/18/15  6:00 AM  Result Value Ref Range   Sodium 135 135 - 145 mmol/L   Potassium 4.6 3.5 - 5.1 mmol/L   Chloride 97 (L) 101 - 111 mmol/L   CO2 29 22 - 32 mmol/L   Glucose, Bld 107 (H) 65 - 99 mg/dL   BUN 17 6 - 20 mg/dL   Creatinine, Ser 1.39 (H) 0.44 - 1.00 mg/dL   Calcium 8.3 (L) 8.9 - 10.3 mg/dL   GFR calc non Af Amer 36 (L) >60 mL/min   GFR calc Af Amer 42 (L) >60 mL/min   Anion gap 9 5 - 15    Studies/Results: Ct Abdomen Pelvis Wo Contrast  Result Date: 11/17/2015 CLINICAL DATA:  Possible diverticular abscess EXAM: CT ABDOMEN AND PELVIS WITHOUT CONTRAST TECHNIQUE: Multidetector CT imaging of the abdomen and pelvis was performed following the standard protocol without IV contrast. COMPARISON:  11/16/2015 FINDINGS: Lower chest: Mild emphysematous changes are noted. No sizable effusion is seen. Hepatobiliary: The gallbladder is decompressed. The liver is within normal limits. Pancreas: Unremarkable. No pancreatic ductal dilatation or surrounding inflammatory  changes. Spleen: Normal in size without focal abnormality. Adrenals/Urinary Tract: Adrenals are within normal limits. Stable renal cysts are noted bilaterally. The bladder is partially decompressed. Stomach/Bowel: Scattered diverticular change of the colon is noted. Some sigmoid wall thickening is seen. The mottled air collection is again identified just anterior to the rectum and slightly eccentric to the left. The contrast load in this region is not appreciable. Again this may represent a redundant loop of sigmoid although the possibility of an extraluminal collection remains. Vascular/Lymphatic: Aortic atherosclerosis. No enlarged abdominal or pelvic lymph nodes. Reproductive: Left ovarian cyst is again seen. History ectomy is been previously performed. Other: No abdominal wall hernia or abnormality. No abdominopelvic ascites. Musculoskeletal: No acute or significant osseous findings. IMPRESSION: Stable changes in the region of the rectosigmoid. This may simply represent redundant loop of colon although imaging was performed prior to contrast reaching the distal colon. The overall appearance is stable from the prior study. Electronically Signed   By: Inez Catalina M.D.   On: 11/17/2015 19:48   Ct Abdomen Pelvis Wo Contrast  Addendum Date: 11/16/2015   ADDENDUM REPORT: 11/16/2015 16:51 ADDENDUM: These results were called by telephone at the time of interpretation on 11/16/2015 at 4:51 pm to Dr.  [facility] , who verbally acknowledged these results. Electronically Signed   By: Marin Olp M.D.   On: 11/16/2015 16:51   Result Date: 11/16/2015 CLINICAL DATA:  Fever overnight. EXAM: CT CHEST, ABDOMEN AND PELVIS WITHOUT CONTRAST TECHNIQUE: Multidetector CT imaging of the chest, abdomen and pelvis was performed following the standard protocol without IV contrast. COMPARISON:  Chest CT 03/19/2009 FINDINGS: CT CHEST FINDINGS Cardiovascular: Mild cardiomegaly. Calcified plaque over the left anterior descending  and lateral circumflex coronary arteries. Calcified plaque over the thoracic aorta. Ascending thoracic aorta measures 3.4 cm in AP diameter. Mediastinum/Nodes: No evidence of mediastinal or hilar adenopathy. Remaining mediastinal structures are within normal. Lungs/Pleura: Lungs are well inflated demonstrate mild to moderate centrilobular emphysematous disease. There is mild bibasilar fibrotic change which has progressed compared to the previous exam. Tiny amount of left pleural fluid. Mild right base atelectasis. Airways within normal. Musculoskeletal: Degenerative change of the spine. CT ABDOMEN PELVIS FINDINGS Hepatobiliary: Gallbladder is contracted.  Liver is within normal. Pancreas: Within normal. Spleen: Within normal. Adrenals/Urinary Tract: Adrenal glands are within normal. Kidneys normal size with multiple bilateral cysts with the largest over the lower pole left kidney measuring 6.6 cm. Ureters are within normal. Bladder demonstrates mild circumferential wall thickening which may be due to underdistention although can be seen with cystitis. Stomach/Bowel: Stomach is within normal. Small bowel is normal. Appendix is normal. Mild fecal retention throughout the colon. There is wall thickening of the sigmoid colon as it courses by the left adnexa. Minimal stranding of the adjacent pericolonic fat. There is a collection of air and mottled lucency which appears to be abutting the left lateral side of the rectosigmoid colon measuring approximately 3.5 x 4.5 cm as this may represent a redundant loop of rectosigmoid colon versus a contained perforation of the rectosigmoid colon. There is no free peritoneal air visualized. Few small lymph nodes in the left perirectal region. These findings could be due to a chronic diverticulitis with possible contain perforation versus contain perforation of rectosigmoid carcinoma with secondary inflammatory change. Vascular/Lymphatic: Moderate calcified plaque over the abdominal  aorta. Reproductive: Suggestion of previous hysterectomy. 3.6 cm oval left adnexal cyst likely ovarian cyst. Right ovary within normal. Musculoskeletal: Moderate degenerative changes of the spine and mild degenerate change of the hips. Mild biphasic curvature of the spine. IMPRESSION: No acute findings in the chest. Moderate emphysematous disease with mild bibasilar fibrotic change demonstrating interval progression. Wall thickening of the rectosigmoid colon with mild adjacent inflammatory change and minimal associated adjacent small lymph nodes. Collection of air mottled lucency along the left lateral aspect of the rectosigmoid colon measuring 3.5 x 4.5 cm. This collection may represent a redundant rectosigmoid loop versus an extraluminal contained perforation either from colon carcinoma or chronic diverticulitis. Consider delayed images as better colonic contrast may help in interpretation of this finding. Mild cardiomegaly and evidence of atherosclerotic coronary artery disease. Aortic atherosclerosis. Bilateral renal cysts with the largest measuring 6.6 cm over the lower pole left kidney. Next Possible 3.6 cm left ovarian cyst. Electronically Signed: By: Marin Olp M.D. On: 11/16/2015 16:42   Ct Chest Wo Contrast  Addendum Date: 11/16/2015   ADDENDUM REPORT: 11/16/2015 16:51 ADDENDUM: These results were called by telephone at the time of interpretation on 11/16/2015 at 4:51 pm to Dr. Oren Binet , who verbally acknowledged these results. Electronically Signed   By: Marin Olp M.D.   On: 11/16/2015 16:51   Result Date: 11/16/2015 CLINICAL DATA:  Fever overnight. EXAM: CT CHEST, ABDOMEN  AND PELVIS WITHOUT CONTRAST TECHNIQUE: Multidetector CT imaging of the chest, abdomen and pelvis was performed following the standard protocol without IV contrast. COMPARISON:  Chest CT 03/19/2009 FINDINGS: CT CHEST FINDINGS Cardiovascular: Mild cardiomegaly. Calcified plaque over the left anterior descending and  lateral circumflex coronary arteries. Calcified plaque over the thoracic aorta. Ascending thoracic aorta measures 3.4 cm in AP diameter. Mediastinum/Nodes: No evidence of mediastinal or hilar adenopathy. Remaining mediastinal structures are within normal. Lungs/Pleura: Lungs are well inflated demonstrate mild to moderate centrilobular emphysematous disease. There is mild bibasilar fibrotic change which has progressed compared to the previous exam. Tiny amount of left pleural fluid. Mild right base atelectasis. Airways within normal. Musculoskeletal: Degenerative change of the spine. CT ABDOMEN PELVIS FINDINGS Hepatobiliary: Gallbladder is contracted.  Liver is within normal. Pancreas: Within normal. Spleen: Within normal. Adrenals/Urinary Tract: Adrenal glands are within normal. Kidneys normal size with multiple bilateral cysts with the largest over the lower pole left kidney measuring 6.6 cm. Ureters are within normal. Bladder demonstrates mild circumferential wall thickening which may be due to underdistention although can be seen with cystitis. Stomach/Bowel: Stomach is within normal. Small bowel is normal. Appendix is normal. Mild fecal retention throughout the colon. There is wall thickening of the sigmoid colon as it courses by the left adnexa. Minimal stranding of the adjacent pericolonic fat. There is a collection of air and mottled lucency which appears to be abutting the left lateral side of the rectosigmoid colon measuring approximately 3.5 x 4.5 cm as this may represent a redundant loop of rectosigmoid colon versus a contained perforation of the rectosigmoid colon. There is no free peritoneal air visualized. Few small lymph nodes in the left perirectal region. These findings could be due to a chronic diverticulitis with possible contain perforation versus contain perforation of rectosigmoid carcinoma with secondary inflammatory change. Vascular/Lymphatic: Moderate calcified plaque over the abdominal  aorta. Reproductive: Suggestion of previous hysterectomy. 3.6 cm oval left adnexal cyst likely ovarian cyst. Right ovary within normal. Musculoskeletal: Moderate degenerative changes of the spine and mild degenerate change of the hips. Mild biphasic curvature of the spine. IMPRESSION: No acute findings in the chest. Moderate emphysematous disease with mild bibasilar fibrotic change demonstrating interval progression. Wall thickening of the rectosigmoid colon with mild adjacent inflammatory change and minimal associated adjacent small lymph nodes. Collection of air mottled lucency along the left lateral aspect of the rectosigmoid colon measuring 3.5 x 4.5 cm. This collection may represent a redundant rectosigmoid loop versus an extraluminal contained perforation either from colon carcinoma or chronic diverticulitis. Consider delayed images as better colonic contrast may help in interpretation of this finding. Mild cardiomegaly and evidence of atherosclerotic coronary artery disease. Aortic atherosclerosis. Bilateral renal cysts with the largest measuring 6.6 cm over the lower pole left kidney. Next Possible 3.6 cm left ovarian cyst. Electronically Signed: By: Marin Olp M.D. On: 11/16/2015 16:42      Assessment: Nonspecific abnormality in the rectosigmoid junction, nature not clear by 2 CT scans.  Plan: Will proceed with sigmoidoscopy today.    Deavon Podgorski C 11/18/2015, 9:22 AM  Pager [phone] If no answer or after 5 PM call [phone]

## 2015-11-18 NOTE — Progress Notes (Signed)
Spoke with Patient's daughter [name] who said she has not heard about the flex sig. Dr. Amedeo Plenty who said he would call her.    [name] [phone] phone number

## 2015-11-18 NOTE — Op Note (Signed)
[facility] Patient Name: Dawn [name] Procedure Date : 11/18/2015 MRN: [id number] Attending MD: Missy Sabins , MD Date of Birth: 20-Aug-1940 CSN: [id number] Age: 75 Admit Type: Inpatient Procedure:                Flexible Sigmoidoscopy Indications:              Abnormal CT of the GI tract Providers:                Elyse Jarvis. Amedeo Plenty, MD, Cleda Daub, RN, Elspeth Cho Tech., Technician Referring MD:              Medicines:                None Complications:            No immediate complications. Estimated Blood Loss:     Estimated blood loss: none. Procedure:                Pre-Anesthesia Assessment:                           - Prior to the procedure, a History and Physical                            was performed, and patient medications and                            allergies were reviewed. The patient's tolerance of                            previous anesthesia was also reviewed. The risks                            and benefits of the procedure and the sedation                            options and risks were discussed with the patient.                            All questions were answered, and informed consent                            was obtained. Prior Anticoagulants: The patient has                            taken no previous anticoagulant or antiplatelet                            agents. ASA Grade Assessment: III - A patient with                            severe systemic disease. After reviewing the risks  and benefits, the patient was deemed in                            satisfactory condition to undergo the procedure.                           After obtaining informed consent, the scope was                            passed under direct vision. The EC-[id number] ([id number])                            scope was introduced through the anus and advanced                            to the the rectosigmoid  junction. The flexible                            sigmoidoscopy was performed with moderate                            difficulty due to inadequate bowel prep. The                            patient tolerated the procedure well. The quality                            of the bowel preparation was poor. Scope In: Scope Out: Findings:      A few small-mouthed diverticula were found in the recto-sigmoid colon.      A moderate amount of semi-solid stool was found in the recto-sigmoid       colon, precluding visualization.      The exam was otherwise without abnormality. Impression:               - Preparation of the colon was poor.                           - Diverticulosis in the recto-sigmoid colon.                           - Stool in the recto-sigmoid colon.                           - The examination was otherwise normal.                           - No specimens collected. Recommendation:           - Repeat flexible sigmoidoscopy in 2 days because                            the bowel preparation was poor. Procedure Code(s):        --- Professional ---  45330, 52, Sigmoidoscopy, flexible; diagnostic,                            including collection of specimen(s) by brushing or                            washing, when performed (separate procedure) Diagnosis Code(s):        --- Professional ---                           K57.30, Diverticulosis of large intestine without                            perforation or abscess without bleeding                           R93.3, Abnormal findings on diagnostic imaging of                            other parts of digestive tract CPT copyright 2016 [employer]. All rights reserved. The codes documented in this report are preliminary and upon coder review may  be revised to meet current compliance requirements. Missy Sabins, MD 11/18/2015 2:54:40 PM This report has been signed electronically. Number of  Addenda: 0

## 2015-11-19 ENCOUNTER — Encounter (HOSPITAL_COMMUNITY): Payer: Self-pay | Admitting: Gastroenterology

## 2015-11-19 LAB — CBC WITH DIFFERENTIAL/PLATELET
BASOS ABS: 0 10*3/uL (ref 0.0–0.1)
BASOS PCT: 0 %
EOS ABS: 0.1 10*3/uL (ref 0.0–0.7)
Eosinophils Relative: 1 %
HEMATOCRIT: 29 % — AB (ref 36.0–46.0)
Hemoglobin: 8.6 g/dL — ABNORMAL LOW (ref 12.0–15.0)
Lymphocytes Relative: 18 %
Lymphs Abs: 0.9 10*3/uL (ref 0.7–4.0)
MCH: 28.1 pg (ref 26.0–34.0)
MCHC: 29.7 g/dL — ABNORMAL LOW (ref 30.0–36.0)
MCV: 94.8 fL (ref 78.0–100.0)
MONO ABS: 0.5 10*3/uL (ref 0.1–1.0)
Monocytes Relative: 11 %
NEUTROS ABS: 3.3 10*3/uL (ref 1.7–7.7)
Neutrophils Relative %: 70 %
PLATELETS: 225 10*3/uL (ref 150–400)
RBC: 3.06 MIL/uL — ABNORMAL LOW (ref 3.87–5.11)
RDW: 16.9 % — AB (ref 11.5–15.5)
WBC: 4.8 10*3/uL (ref 4.0–10.5)

## 2015-11-19 LAB — BASIC METABOLIC PANEL
ANION GAP: 6 (ref 5–15)
BUN: 14 mg/dL (ref 4–21)
BUN: 14 mg/dL (ref 6–20)
CO2: 35 mmol/L — ABNORMAL HIGH (ref 22–32)
Calcium: 8.1 mg/dL — ABNORMAL LOW (ref 8.9–10.3)
Chloride: 97 mmol/L — ABNORMAL LOW (ref 101–111)
Creatinine, Ser: 1.19 mg/dL — ABNORMAL HIGH (ref 0.44–1.00)
Creatinine: 1.2 mg/dL — AB (ref 0.5–1.1)
GFR calc Af Amer: 50 mL/min — ABNORMAL LOW (ref >60)
GFR, EST NON AFRICAN AMERICAN: 43 mL/min — AB (ref >60)
GLUCOSE: 88 mg/dL
Glucose, Bld: 88 mg/dL (ref 65–99)
POTASSIUM: 4.7 mmol/L (ref 3.5–5.1)
Potassium: 4.7 mmol/L (ref 3.4–5.3)
SODIUM: 138 mmol/L (ref 137–147)
SODIUM: 138 mmol/L (ref 135–145)

## 2015-11-19 LAB — CBC AND DIFFERENTIAL
HEMATOCRIT: 29 % — AB (ref 36–46)
HEMOGLOBIN: 8.6 g/dL — AB (ref 12.0–16.0)
Platelets: 225 10*3/uL (ref 150–399)
WBC: 4.8 10*3/mL

## 2015-11-19 LAB — MAGNESIUM: MAGNESIUM: 2.4 mg/dL (ref 1.7–2.4)

## 2015-11-19 MED ORDER — SODIUM CHLORIDE 0.9 % IV SOLN: INTRAVENOUS | Status: DC

## 2015-11-19 MED ORDER — DIGOXIN 0.25 MG/ML IJ SOLN
0.2500 mg | Freq: Two times a day (BID) | INTRAMUSCULAR | Status: AC
  Administered 2015-11-19 (×2): 0.25 mg via INTRAVENOUS
  Filled 2015-11-19 (×3): qty 2

## 2015-11-19 MED ORDER — PEG 3350-KCL-NA BICARB-NACL 420 G PO SOLR
4000.0000 mL | Freq: Once | ORAL | Status: AC
  Administered 2015-11-19: 4000 mL via ORAL
  Filled 2015-11-19: qty 4000

## 2015-11-19 MED ORDER — DIGOXIN 125 MCG PO TABS
0.0625 mg | ORAL_TABLET | Freq: Every day | ORAL | Status: DC
  Administered 2015-11-20: 0.0625 mg via ORAL
  Filled 2015-11-19: qty 1

## 2015-11-19 MED ORDER — SODIUM CHLORIDE 0.9 % IV SOLN
INTRAVENOUS | Status: DC
  Administered 2015-11-19 (×2): via INTRAVENOUS

## 2015-11-19 NOTE — Progress Notes (Signed)
    [facility]   Reason for visit: Follow up on  fever  Interval History: she remains afebrile  Physical Exam: Constitutional:  Vitals:   11/19/15 1454 11/19/15 1519  BP:  104/68  Pulse: 78 71  Resp:    Temp:     patient appears in NAD  Impression: resolved fever.  She has done well off of antibiotics and has not had a fever >72 hours.  GI work up continues.    Plan: 1.  No changes. I will sign off, please call for any new issues.   thanks

## 2015-11-19 NOTE — Progress Notes (Signed)
Pt digoxin fell from my hand and broke Ivanna witnessed the broken one and I took another dose from pyxis

## 2015-11-19 NOTE — Progress Notes (Addendum)
Pts. Daughter can be reached Friday 11/10- Monday 11/13 8a-8p at her work phone number [phone]

## 2015-11-19 NOTE — Progress Notes (Signed)
[facility] Progress Note  Subjective: Patient feels about the same. No new complaints  Objective: Vital signs in last 24 hours: Temp:  [97.8 F (36.6 C)-98.8 F (37.1 C)] 98.1 F (36.7 C) (11/09 0453) Pulse Rate:  [73-165] 108 (11/09 0654) Resp:  [17-27] 17 (11/09 0453) BP: (81-108)/(46-74) 99/50 (11/09 0453) SpO2:  [87 %-100 %] 100 % (11/09 0453) Weight:  [56.5 kg (124 lb 9 oz)-57 kg (125 lb 10.6 oz)] 56.5 kg (124 lb 9 oz) (11/09 0453) Weight change:    PE: Unchanged  Lab Results: Results for orders placed or performed during the hospital encounter of 11/10/15 (from the past 24 hour(s))  Basic metabolic panel     Status: Abnormal   Collection Time: 11/19/15  4:28 AM  Result Value Ref Range   Sodium 138 135 - 145 mmol/L   Potassium 4.7 3.5 - 5.1 mmol/L   Chloride 97 (L) 101 - 111 mmol/L   CO2 35 (H) 22 - 32 mmol/L   Glucose, Bld 88 65 - 99 mg/dL   BUN 14 6 - 20 mg/dL   Creatinine, Ser 1.19 (H) 0.44 - 1.00 mg/dL   Calcium 8.1 (L) 8.9 - 10.3 mg/dL   GFR calc non Af Amer 43 (L) >60 mL/min   GFR calc Af Amer 50 (L) >60 mL/min   Anion gap 6 5 - 15  Magnesium     Status: None   Collection Time: 11/19/15  4:28 AM  Result Value Ref Range   Magnesium 2.4 1.7 - 2.4 mg/dL  CBC with Differential/Platelet     Status: Abnormal   Collection Time: 11/19/15  4:28 AM  Result Value Ref Range   WBC 4.8 4.0 - 10.5 K/uL   RBC 3.06 (L) 3.87 - 5.11 MIL/uL   Hemoglobin 8.6 (L) 12.0 - 15.0 g/dL   HCT 29.0 (L) 36.0 - 46.0 %   MCV 94.8 78.0 - 100.0 fL   MCH 28.1 26.0 - 34.0 pg   MCHC 29.7 (L) 30.0 - 36.0 g/dL   RDW 16.9 (H) 11.5 - 15.5 %   Platelets 225 150 - 400 K/uL   Neutrophils Relative % 70 %   Neutro Abs 3.3 1.7 - 7.7 K/uL   Lymphocytes Relative 18 %   Lymphs Abs 0.9 0.7 - 4.0 K/uL   Monocytes Relative 11 %   Monocytes Absolute 0.5 0.1 - 1.0 K/uL   Eosinophils Relative 1 %   Eosinophils Absolute 0.1 0.0 - 0.7 K/uL   Basophils Relative 0 %   Basophils Absolute 0.0  0.0 - 0.1 K/uL    Studies/Results: Ct Abdomen Pelvis Wo Contrast  Result Date: 11/17/2015 CLINICAL DATA:  Possible diverticular abscess EXAM: CT ABDOMEN AND PELVIS WITHOUT CONTRAST TECHNIQUE: Multidetector CT imaging of the abdomen and pelvis was performed following the standard protocol without IV contrast. COMPARISON:  11/16/2015 FINDINGS: Lower chest: Mild emphysematous changes are noted. No sizable effusion is seen. Hepatobiliary: The gallbladder is decompressed. The liver is within normal limits. Pancreas: Unremarkable. No pancreatic ductal dilatation or surrounding inflammatory changes. Spleen: Normal in size without focal abnormality. Adrenals/Urinary Tract: Adrenals are within normal limits. Stable renal cysts are noted bilaterally. The bladder is partially decompressed. Stomach/Bowel: Scattered diverticular change of the colon is noted. Some sigmoid wall thickening is seen. The mottled air collection is again identified just anterior to the rectum and slightly eccentric to the left. The contrast load in this region is not appreciable. Again this may represent a redundant loop of sigmoid although the possibility  of an extraluminal collection remains. Vascular/Lymphatic: Aortic atherosclerosis. No enlarged abdominal or pelvic lymph nodes. Reproductive: Left ovarian cyst is again seen. History ectomy is been previously performed. Other: No abdominal wall hernia or abnormality. No abdominopelvic ascites. Musculoskeletal: No acute or significant osseous findings. IMPRESSION: Stable changes in the region of the rectosigmoid. This may simply represent redundant loop of colon although imaging was performed prior to contrast reaching the distal colon. The overall appearance is stable from the prior study. Electronically Signed   By: Inez Catalina M.D.   On: 11/17/2015 19:48      Assessment: Ill-defined space-occupying lesion involving the rectosigmoid and sigmoid colon, sigmoidoscopy yesterday  nondiagnostic due to poor prep and inability to sedate because of recent food ingestion.  Plan: Will schedule repeat study tomorrow with full colon prep. Scheduled for 9 AM.    Aveah Castell C 11/19/2015, 7:49 AM  Pager [phone] If no answer or after 5 PM call [phone]

## 2015-11-19 NOTE — Progress Notes (Signed)
Pt dtr called CSW and informed of facility choice- would like [facility] as first choice.  CSW updated [facility] who can accept pt when stable for DC.  CSW will continue to follow  Jorge Ny, LCSW Clinical Social Worker [phone]

## 2015-11-19 NOTE — Progress Notes (Signed)
Patient Name: Dawn [name] Date of Encounter: 11/19/2015  Primary Cardiologist: Stanford Breed, M.D.  Hospital Problem List     Principal Problem:   Acute kidney injury [facility]) Active Problems:   Acute delirium   Normocytic anemia   Anxiety and depression   Chronic pain syndrome   Recurrent UTI   Acute respiratory failure with hypoxia (HCC)   COPD (chronic obstructive pulmonary disease) (HCC)   Acute hyperglycemia   CKD (chronic kidney disease) stage 3, GFR 30-59 ml/min   Narrow complex tachycardia (HCC)   HTN (hypertension)   Anemia   Delirium   Hypoxemia   Tachycardia   Generalized weakness   Septic shock (HCC)   Acute encephalopathy   Hypokalemia   Sepsis (HCC)   Hyperthyroidism   Hypotension   Premature atrial contractions   Functional diarrhea   Intra-abdominal fluid collection   Protein-calorie malnutrition, severe     Subjective   No Idea of what her treatment plan is. She denies cardiovascular symptoms. Eating breakfast this morning, liquids without difficulty.  Inpatient Medications    Scheduled Meds: . acidophilus  1 capsule Oral Daily  . ALPRAZolam  0.5 mg Oral TID  . buPROPion  150 mg Oral Daily  . enoxaparin (LOVENOX) injection  40 mg Subcutaneous Q24H  . feeding supplement (ENSURE ENLIVE)  237 mL Oral BID BM  . gabapentin  600 mg Oral TID  . loperamide  2 mg Oral BID  . methimazole  10 mg Oral BID  . metoprolol tartrate  25 mg Oral BID  . oxyCODONE  5 mg Oral Q8H  . polyethylene glycol-electrolytes  4,000 mL Oral Once   Continuous Infusions: . sodium chloride     PRN Meds: acetaminophen, levalbuterol, loperamide, ondansetron **OR** ondansetron (ZOFRAN) IV   Vital Signs    Vitals:   11/18/15 2147 11/18/15 2213 11/19/15 0453 11/19/15 0654  BP: (!) 86/46 94/60 (!) 99/50   Pulse: 93 (!) 165 (!) 152 (!) 108  Resp: '18 17 17   '$ Temp: 98.8 F (37.1 C) 97.8 F (36.6 C) 98.1 F (36.7 C)   TempSrc: Oral Oral Oral   SpO2: 95% 97% 100%     Weight:   124 lb 9 oz (56.5 kg)   Height:        Intake/Output Summary (Last 24 hours) at 11/19/15 0934 Last data filed at 11/18/15 1253  Gross per 24 hour  Intake                0 ml  Output                0 ml  Net                0 ml   Filed Weights   11/17/15 0512 11/18/15 1029 11/19/15 0453  Weight: 135 lb 5.8 oz (61.4 kg) 125 lb 10.6 oz (57 kg) 124 lb 9 oz (56.5 kg)    Physical Exam    GEN: Elderly and frail and in no acute distress.  HEENT: Grossly normal.  Neck: Supple, no JVD, carotid bruits, or masses. Cardiac: RRR, no murmurs, rubs, or gallops. No clubbing, cyanosis, edema.  Radials/DP/PT 2+ and equal bilaterally.  Respiratory:  Respirations regular and unlabored, clear to auscultation bilaterally. GI: Soft, nontender, nondistended, BS + x 4. MS: no deformity or atrophy. Skin: warm and dry, no rash. Neuro:  Strength and sensation are intact. Psych: AAOx3.  Normal affect.  Labs    CBC  Recent Labs  11/18/15 0600 11/19/15 0428  WBC 5.3 4.8  NEUTROABS  --  3.3  HGB 8.2* 8.6*  HCT 26.6* 29.0*  MCV 93.3 94.8  PLT 239 174   Basic Metabolic Panel  Recent Labs  11/18/15 0600 11/19/15 0428  NA 135 138  K 4.6 4.7  CL 97* 97*  CO2 29 35*  GLUCOSE 107* 88  BUN 17 14  CREATININE 1.39* 1.19*  CALCIUM 8.3* 8.1*  MG  --  2.4     Telemetry    Normal sinus rhythm with occasional runs of SVT that was self terminating. Rates are up to 140 bpm. - Personally Reviewed  ECG    Not repeated - Personally Reviewed  Radiology    Ct Abdomen Pelvis Wo Contrast  Result Date: 11/17/2015 CLINICAL DATA:  Possible diverticular abscess EXAM: CT ABDOMEN AND PELVIS WITHOUT CONTRAST TECHNIQUE: Multidetector CT imaging of the abdomen and pelvis was performed following the standard protocol without IV contrast. COMPARISON:  11/16/2015 FINDINGS: Lower chest: Mild emphysematous changes are noted. No sizable effusion is seen. Hepatobiliary: The gallbladder is decompressed.  The liver is within normal limits. Pancreas: Unremarkable. No pancreatic ductal dilatation or surrounding inflammatory changes. Spleen: Normal in size without focal abnormality. Adrenals/Urinary Tract: Adrenals are within normal limits. Stable renal cysts are noted bilaterally. The bladder is partially decompressed. Stomach/Bowel: Scattered diverticular change of the colon is noted. Some sigmoid wall thickening is seen. The mottled air collection is again identified just anterior to the rectum and slightly eccentric to the left. The contrast load in this region is not appreciable. Again this may represent a redundant loop of sigmoid although the possibility of an extraluminal collection remains. Vascular/Lymphatic: Aortic atherosclerosis. No enlarged abdominal or pelvic lymph nodes. Reproductive: Left ovarian cyst is again seen. History ectomy is been previously performed. Other: No abdominal wall hernia or abnormality. No abdominopelvic ascites. Musculoskeletal: No acute or significant osseous findings. IMPRESSION: Stable changes in the region of the rectosigmoid. This may simply represent redundant loop of colon although imaging was performed prior to contrast reaching the distal colon. The overall appearance is stable from the prior study. Electronically Signed   By: Inez Catalina M.D.   On: 11/17/2015 19:48    Cardiac Studies   No new data  Patient Profile     75 year old admitted with shock, encephalopathy, UTI, and noted to have supraventricular tachycardia. Also had severe anemia. Also noted to have hyperthyroidism, COPD, and diarrheal illness. Heart rhythm has improved with oral titration of beta blocker therapy  Assessment & Plan    1. 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 Since decreasing metoprolol to a lower dose, tachycardia has become more prevalent. Will add low-dose digoxin to help better control the tachycardia. Dig will not impact her blood pressure. 2.  Hypotension, likely related to volume depletion. She has poor intake. We'll start normal saline 3. Acute kidney injury, likely due to volume contraction/hypotension.  Signed, Sinclair Grooms, MD  11/19/2015, 9:34 AM

## 2015-11-19 NOTE — Evaluation (Signed)
Occupational Therapy Evaluation Patient Details Name: Dawn [name] MRN: [id number] DOB: 10-06-1940 Today's Date: 11/19/2015    History of Present Illness Pt is a 75 y/o female admitted secondary to AMS. PMH including but not limited to OA, anxiety and depression.   Clinical Impression   Pt admitted with the above diagnoses and presents with below problem list. Pt will benefit from continued OT to address the below listed deficits and maximize independence with basic ADLs prior to d/c to next venue. PTA pt was independent with ADLs. Pt is currently min to mod A with LB ADLs and functional transfers. Multiple episodes of bowel incontinence during session with pt prepping for colonoscopy exam. Will follow.       Follow Up Recommendations  SNF    Equipment Recommendations  Other (comment) (defer to next venue)    Recommendations for Other Services       Precautions / Restrictions Precautions Precautions: Fall Precaution Comments: impulsive, confused Restrictions Weight Bearing Restrictions: No      Mobility Bed Mobility Overal bed mobility: Needs Assistance Bed Mobility: Supine to Sit     Supine to sit: HOB elevated;Min guard        Transfers Overall transfer level: Needs assistance Equipment used: Rolling walker (2 wheeled);None Transfers: Sit to/from [employer] to Stand: Min assist Stand pivot transfers: Min assist;Mod assist       General transfer comment: min-mod A due to impulsivity. LOB on first attempt with pt trying to quickly get to [facility].     Balance Overall balance assessment: Needs assistance Sitting-balance support: Feet supported;No upper extremity supported Sitting balance-Leahy Scale: Good     Standing balance support: Bilateral upper extremity supported;During functional activity Standing balance-Leahy Scale: Fair Standing balance comment: BUE of rw during pericare in sit<>stand                             ADL Overall ADL's : Needs assistance/impaired Eating/Feeding: Set up;Sitting   Grooming: Set up;Sitting   Upper Body Bathing: Set up;Sitting   Lower Body Bathing: Minimal assistance;Sit to/from stand   Upper Body Dressing : Set up;Sitting   Lower Body Dressing: Minimal assistance;Sit to/from stand   Toilet Transfer: Minimal assistance;Stand-pivot;Ambulation;RW;BSC;Moderate assistance Toilet Transfer Details (indicate cue type and reason): BSC due to urgency and frequency with colonoscopy prep. Mod A due to LOB on initial SPT Toileting- Clothing Manipulation and Hygiene: Moderate assistance;Sit to/from stand Toileting - Clothing Manipulation Details (indicate cue type and reason): Pt stood with rw while therapist performed pericare.  Tub/ Shower Transfer: Moderate assistance;Ambulation;3 in 1;Rolling walker Tub/Shower Transfer Details (indicate cue type and reason): pt normally sponge baths at baseline. Functional mobility during ADLs: Min guard;Rolling walker General ADL Comments: Pt completed bed mobility and SPT to [facility]. Multiple sit<>stands and pericare during session. Pt with 1 LOB on initial stand from EOB while initiating SPT, mod A needed to recover balance.      Vision     Perception     Praxis      Pertinent Vitals/Pain Pain Assessment: 0-10 Pain Score: 8  Pain Location: head and back Pain Descriptors / Indicators: Grimacing;Aching Pain Intervention(s): Monitored during session;Limited activity within patient's tolerance;Repositioned;Patient requesting pain meds-RN notified;RN gave pain meds during session     Hand Dominance     Extremity/Trunk Assessment Upper Extremity Assessment Upper Extremity Assessment: Overall WFL for tasks assessed;Generalized weakness   Lower Extremity Assessment Lower Extremity Assessment: Defer to  PT evaluation       Communication Communication Communication: No difficulties   Cognition Arousal/Alertness:  Awake/alert Behavior During Therapy: WFL for tasks assessed/performed;Impulsive Overall Cognitive Status: Difficult to assess Area of Impairment: Safety/judgement     Memory: Decreased short-term memory   Safety/Judgement: Decreased awareness of safety;Decreased awareness of deficits         General Comments       Exercises       Shoulder Instructions      Home Living Family/patient expects to be discharged to:: Private residence Living Arrangements: Children Available Help at Discharge: Family;Available PRN/intermittently Type of Home: House Home Access: Stairs to enter [employer] of Steps: 3   Home Layout: One level     Bathroom Shower/Tub: Tub/shower unit Shower/tub characteristics: Curtain Biochemist, clinical: Standard     Home Equipment: Environmental consultant - 2 wheels   Additional Comments: possibly needs to sidestep with rw in bathroom       Prior Functioning/Environment Level of Independence: Independent                 OT Problem List: Decreased activity tolerance;Impaired balance (sitting and/or standing);Decreased cognition;Decreased safety awareness;Decreased knowledge of use of DME or AE;Decreased knowledge of precautions;Pain   OT Treatment/Interventions: Self-care/ADL training;Energy conservation;DME and/or AE instruction;Therapeutic activities;Cognitive remediation/compensation;Patient/family education;Balance training    OT Goals(Current goals can be found in the care plan section) Acute Rehab OT Goals Patient Stated Goal: return home OT Goal Formulation: With patient/family Time For Goal Achievement: 12/03/15 Potential to Achieve Goals: Good ADL Goals Pt Will Perform Upper Body Bathing: with modified independence;sitting Pt Will Perform Lower Body Bathing: with supervision;sit to/from stand Pt Will Perform Upper Body Dressing: with modified independence;sitting Pt Will Perform Lower Body Dressing: with supervision;sit to/from stand Pt Will  Transfer to Toilet: with supervision;ambulating Pt Will Perform Toileting - Clothing Manipulation and hygiene: with modified independence;sit to/from stand  OT Frequency: Min 2X/week   Barriers to D/C:            Co-evaluation              End of Session Equipment Utilized During Treatment: Rolling walker;Gait belt  Activity Tolerance: Patient limited by fatigue;Other (comment) (prepping for colonoscopy exam) Patient left: with call bell/phone within reach;with nursing/sitter in room;with family/visitor present;Other (comment) (on [facility])   Time: 8264-1583 OT Time Calculation (min): 26 min Charges:  OT General Charges $OT Visit: 1 Procedure OT Evaluation $OT Eval Low Complexity: 1 Procedure OT Treatments $Self Care/Home Management : 8-22 mins G-Codes:    Hortencia Pilar 12-13-2015, 2:42 PM

## 2015-11-19 NOTE — Progress Notes (Signed)
PROGRESS NOTE    Dawn [name]  GYJ:[id number] DOB: [birth year] DOA: 11/10/2015 PCP: [name], MD     Brief Narrative:  Patient is a 75 y.o.female with past medical history of anxiety and depression, recently treated in the outpatient setting for a UTI with Macrobid, admitted for evaluation of acute encephalopathy. Per family, patient has been lethargic, with no significant oral intake for at least 2 weeks prior to this admission. She is also had some intermittent nausea,vomiting and diarrhea. She was found to be febrile, with low blood pressures and admitted to the hospital with empiric antibiotics and aggressive IV fluid resuscitation. She has now stabilized, however continues to have fever. Culture data and stool studies are unrevealing so far. Hospital course complicated by development of SVT and new diagnosis of hyperthyroidism.     Assessment & Plan:   Principal Problem:   Acute kidney injury ([place]) Active Problems:   Acute delirium   Normocytic anemia   Anxiety and depression   Chronic pain syndrome   Recurrent UTI   Acute respiratory failure with hypoxia (HCC)   COPD (chronic obstructive pulmonary disease) (HCC)   Acute hyperglycemia   CKD (chronic kidney disease) stage 3, GFR 30-59 ml/min   Narrow complex tachycardia (HCC)   HTN (hypertension)   Anemia   Delirium   Hypoxemia   Tachycardia   Generalized weakness   Septic shock (HCC)   Acute encephalopathy   Hypokalemia   Sepsis (HCC)   Hyperthyroidism   Hypotension   Premature atrial contractions   Functional diarrhea   Intra-abdominal fluid collection   Protein-calorie malnutrition, severe   Acute encephalopathy: Resolved. Likely metabolic encephalopathy in a setting of sepsis, dehydration and hyperthyroidism. CT head on 10/31 negative for acute abnormalities.  ?Sepsis: Fever and hypotension during the early part of her hospital stay-initially thought to be sepsis due to UTI or diarrhea-and treated  with vancomycin and Rocephin. Stool studies negative, blood cultures negative (one set of blood culture positive for coag-negative staph-thought to be a contaminant). Infectious disease was consulted, all antibiotics were discontinued on 11/3, she was doing well until 11/6 when she had a fever of 102.8. This prompted a CT scan of her chest and abdomen, which showed a 3.54.5 cm collection just lateral to the rectosigmoid. GI and IR have been consulted. Repeat CT with oral contrast 11/7 with similar appearance, redundant loop of colon vs fluid collection. Sigmoidoscopy on 11/8 with diverticulosis but poor prep. Plan for repeat flex sig 11/10  YOV:ZCHYIF provoked by acute illness and newly diagnosed hyperthyroidism. Echo with EF around 45-50%. Continue metoprolol. Cardiology following.   Acute kidney injury:Likely mild prerenal azotemia setting of sepsis/UTI/poor oral intake. Improved today. Continue to monitor BMP.   Hyperthyroidism:New diagnosis. Likely the cause of diarrhea/hypovolumia and SVT. Patient started on Tapazole and metoprolol. Will need to follow up with scheduled outpatient Endo appointment 11/13  Diarrhea: Continues-but much better-probably related to hyperthyroidism. Stool studies looking C. difficile and GI pathogen panel are negative.Continue scheduled Imodium and add a probiotic. Currently started on Tapazole-hopefully diarrhea was slowly abate with continued treatment of underlying hyperthyroidism.   Anemia: Appears to have chronic anemia at baseline-suspect acute drop in hemoglobin due to IV fluid dilution, acute illness. Transfused 1 unit of PRBC on 11/1-hemoglobin stable.   Mild Vol Overload: likely 2/2 aggressive IVF resuscitation->5L + balance-weight is up to 136 lb-IVF stopped on 11/3 and received Lasix 20 mg IV 11/7    Anxiety and depression: Continue Xanax and resume Wellbutrin.  History of chronic back pain:Have resumed narcotics as no longer with hypotension  and lethargy.  Chronic hypotension: Per family (daughter) patient'sblood pressure is mostly in the 39J systolic range-random cortisol levels appear appropriate   DVT prophylaxis: lovenox  Code Status: full Family Communication: No family at bedside Disposition Plan: SNF recommended    Consultants:   PCCM  Cardiology   Infectious disease   GI  IR  Procedures:   None   Antimicrobials:  Vanco 11/1>>11/3 Flagyl 11/1>>11/2 Rocephin 10/31>>11/3    Subjective: Complaining of a mild headache, but no other complaints. Denies any abdominal pain, fevers, chest pain, nausea or vomiting.   Objective: Vitals:   11/18/15 2147 11/18/15 2213 11/19/15 0453 11/19/15 0654  BP: (!) 86/46 94/60 (!) 99/50   Pulse: 93 (!) 165 (!) 152 (!) 108  Resp: '18 17 17   '$ Temp: 98.8 F (37.1 C) 97.8 F (36.6 C) 98.1 F (36.7 C)   TempSrc: Oral Oral Oral   SpO2: 95% 97% 100%   Weight:   56.5 kg (124 lb 9 oz)   Height:        Intake/Output Summary (Last 24 hours) at 11/19/15 0940 Last data filed at 11/18/15 1253  Gross per 24 hour  Intake                0 ml  Output                0 ml  Net                0 ml   Filed Weights   11/17/15 0512 11/18/15 1029 11/19/15 0453  Weight: 61.4 kg (135 lb 5.8 oz) 57 kg (125 lb 10.6 oz) 56.5 kg (124 lb 9 oz)    Examination:  General exam: Appears calm and comfortable  Respiratory system: Clear to auscultation. Respiratory effort normal. Cardiovascular system: S1 & S2 heard, tachycardic, regular. No JVD, murmurs, rubs, gallops or clicks. Trace pedal edema. Gastrointestinal system: Abdomen is nondistended, soft and nontender. No organomegaly or masses felt. Normal bowel sounds heard. Central nervous system: Alert and oriented. No focal neurological deficits. Extremities: Symmetric 5 x 5 power. Skin: No rashes, lesions or ulcers Psychiatry: Judgement and insight appear normal. Mood & affect appropriate.   Data Reviewed: I have personally  reviewed following labs and imaging studies  CBC:  Recent Labs Lab 11/13/15 0129 11/14/15 0556 11/15/15 0541 11/16/15 0933 11/18/15 0600 11/19/15 0428  WBC 7.5 4.3 5.1 4.4 5.3 4.8  NEUTROABS 5.8 2.7  --   --   --  3.3  HGB 8.1* 7.3* 8.0* 8.1* 8.2* 8.6*  HCT 26.3* 23.4* 26.8* 27.1* 26.6* 29.0*  MCV 89.5 91.4 92.7 93.8 93.3 94.8  PLT 243 178 214 194 239 673   Basic Metabolic Panel:  Recent Labs Lab 11/13/15 0129 11/14/15 0556 11/15/15 0541 11/16/15 0933 11/17/15 0827 11/18/15 0600 11/19/15 0428  NA 139 139 138 136 138 135 138  K 3.6 3.0* 4.0 3.9 4.3 4.6 4.7  CL 106 109 105 105 101 97* 97*  CO2 '25 24 27 23 30 29 '$ 35*  GLUCOSE 125* 86 97 146* 94 107* 88  BUN '13 11 11 11 11 17 14  '$ CREATININE 1.10* 0.90 1.18* 1.15* 1.14* 1.39* 1.19*  CALCIUM 7.3* 7.3* 7.8* 8.0* 8.4* 8.3* 8.1*  MG 2.4 1.9  --   --   --   --  2.4  PHOS 3.7 3.2  --   --   --   --   --  GFR: Estimated Creatinine Clearance: 35.9 mL/min (by C-G formula based on SCr of 1.19 mg/dL (H)). Liver Function Tests:  Recent Labs Lab 11/13/15 0129 11/14/15 0556  ALBUMIN 1.7* 1.6*   No results for input(s): LIPASE, AMYLASE in the last 168 hours. No results for input(s): AMMONIA in the last 168 hours. Coagulation Profile: No results for input(s): INR, PROTIME in the last 168 hours. Cardiac Enzymes: No results for input(s): CKTOTAL, CKMB, CKMBINDEX, TROPONINI in the last 168 hours. BNP (last 3 results) No results for input(s): PROBNP in the last 8760 hours. HbA1C: No results for input(s): HGBA1C in the last 72 hours. CBG: No results for input(s): GLUCAP in the last 168 hours. Lipid Profile: No results for input(s): CHOL, HDL, LDLCALC, TRIG, CHOLHDL, LDLDIRECT in the last 72 hours. Thyroid Function Tests: No results for input(s): TSH, T4TOTAL, FREET4, T3FREE, THYROIDAB in the last 72 hours. Anemia Panel: No results for input(s): VITAMINB12, FOLATE, FERRITIN, TIBC, IRON, RETICCTPCT in the last 72  hours. Sepsis Labs:  Recent Labs Lab 11/13/15 0129 11/14/15 0556  PROCALCITON 0.38 0.28    Recent Results (from the past 240 hour(s))  Urine culture     Status: None   Collection Time: 11/10/15  2:30 AM  Result Value Ref Range Status   Specimen Description   Final    URINE, CLEAN CATCH Performed at [facility] Requests NONE  Final   Culture NO GROWTH  Final   Report Status 11/11/2015 FINAL  Final  Culture, blood (Routine X 2) w Reflex to ID Panel     Status: None   Collection Time: 11/10/15  9:15 AM  Result Value Ref Range Status   Specimen Description BLOOD LEFT ARM  Final   Special Requests IN PEDIATRIC BOTTLE 2CC  Final   Culture NO GROWTH 5 DAYS  Final   Report Status 11/15/2015 FINAL  Final  Culture, blood (Routine X 2) w Reflex to ID Panel     Status: Abnormal   Collection Time: 11/10/15  9:20 AM  Result Value Ref Range Status   Specimen Description BLOOD LEFT HAND  Final   Special Requests IN PEDIATRIC BOTTLE 2CC  Final   Culture  Setup Time   Final    AEROBIC BOTTLE ONLY GRAM POSITIVE COCCI IN CLUSTERS CRITICAL RESULT CALLED TO, READ BACK BY AND VERIFIED WITH: GREG ABBOTT,PHARMD '@0019'$  11/12/15 MKELLY,MLT    Culture (A)  Final    STAPHYLOCOCCUS SPECIES (COAGULASE NEGATIVE) THE SIGNIFICANCE OF ISOLATING THIS ORGANISM FROM A SINGLE VENIPUNCTURE CANNOT BE PREDICTED WITHOUT FURTHER CLINICAL AND CULTURE CORRELATION. SUSCEPTIBILITIES AVAILABLE ONLY ON REQUEST.    Report Status 11/13/2015 FINAL  Final  C difficile quick scan w PCR reflex     Status: None   Collection Time: 11/11/15  4:54 PM  Result Value Ref Range Status   C Diff antigen NEGATIVE NEGATIVE Final   C Diff toxin NEGATIVE NEGATIVE Final   C Diff interpretation No C. difficile detected.  Final  Gastrointestinal Panel by PCR , Stool     Status: None   Collection Time: 11/11/15  4:54 PM  Result Value Ref Range Status   Campylobacter species NOT DETECTED NOT DETECTED Final    Plesimonas shigelloides NOT DETECTED NOT DETECTED Final   Salmonella species NOT DETECTED NOT DETECTED Final   Yersinia enterocolitica NOT DETECTED NOT DETECTED Final   Vibrio species NOT DETECTED NOT DETECTED Final   Vibrio cholerae NOT DETECTED NOT DETECTED Final   Enteroaggregative E coli (  EAEC) NOT DETECTED NOT DETECTED Final   Enteropathogenic E coli (EPEC) NOT DETECTED NOT DETECTED Final   Enterotoxigenic E coli (ETEC) NOT DETECTED NOT DETECTED Final   Shiga like toxin producing E coli (STEC) NOT DETECTED NOT DETECTED Final   Shigella/Enteroinvasive E coli (EIEC) NOT DETECTED NOT DETECTED Final   Cryptosporidium NOT DETECTED NOT DETECTED Final   Cyclospora cayetanensis NOT DETECTED NOT DETECTED Final   Entamoeba histolytica NOT DETECTED NOT DETECTED Final   Giardia lamblia NOT DETECTED NOT DETECTED Final   Adenovirus F40/41 NOT DETECTED NOT DETECTED Final   Astrovirus NOT DETECTED NOT DETECTED Final   Norovirus GI/GII NOT DETECTED NOT DETECTED Final   Rotavirus A NOT DETECTED NOT DETECTED Final   Sapovirus (I, II, IV, and V) NOT DETECTED NOT DETECTED Final  MRSA PCR Screening     Status: None   Collection Time: 11/11/15 11:43 PM  Result Value Ref Range Status   MRSA by PCR NEGATIVE NEGATIVE Final    Comment:        The GeneXpert MRSA Assay (FDA approved for NASAL specimens only), is one component of a comprehensive MRSA colonization surveillance program. It is not intended to diagnose MRSA infection nor to guide or monitor treatment for MRSA infections.   Culture, blood (routine x 2)     Status: None (Preliminary result)   Collection Time: 11/16/15  9:35 AM  Result Value Ref Range Status   Specimen Description BLOOD LEFT ARM  Final   Special Requests IN PEDIATRIC BOTTLE 2CC  Final   Culture NO GROWTH 2 DAYS  Final   Report Status PENDING  Incomplete  Culture, blood (routine x 2)     Status: None (Preliminary result)   Collection Time: 11/16/15  9:38 AM  Result Value  Ref Range Status   Specimen Description BLOOD RIGHT HAND  Final   Special Requests IN PEDIATRIC BOTTLE 1CC  Final   Culture NO GROWTH 2 DAYS  Final   Report Status PENDING  Incomplete       Radiology Studies: Ct Abdomen Pelvis Wo Contrast  Result Date: 11/17/2015 CLINICAL DATA:  Possible diverticular abscess EXAM: CT ABDOMEN AND PELVIS WITHOUT CONTRAST TECHNIQUE: Multidetector CT imaging of the abdomen and pelvis was performed following the standard protocol without IV contrast. COMPARISON:  11/16/2015 FINDINGS: Lower chest: Mild emphysematous changes are noted. No sizable effusion is seen. Hepatobiliary: The gallbladder is decompressed. The liver is within normal limits. Pancreas: Unremarkable. No pancreatic ductal dilatation or surrounding inflammatory changes. Spleen: Normal in size without focal abnormality. Adrenals/Urinary Tract: Adrenals are within normal limits. Stable renal cysts are noted bilaterally. The bladder is partially decompressed. Stomach/Bowel: Scattered diverticular change of the colon is noted. Some sigmoid wall thickening is seen. The mottled air collection is again identified just anterior to the rectum and slightly eccentric to the left. The contrast load in this region is not appreciable. Again this may represent a redundant loop of sigmoid although the possibility of an extraluminal collection remains. Vascular/Lymphatic: Aortic atherosclerosis. No enlarged abdominal or pelvic lymph nodes. Reproductive: Left ovarian cyst is again seen. History ectomy is been previously performed. Other: No abdominal wall hernia or abnormality. No abdominopelvic ascites. Musculoskeletal: No acute or significant osseous findings. IMPRESSION: Stable changes in the region of the rectosigmoid. This may simply represent redundant loop of colon although imaging was performed prior to contrast reaching the distal colon. The overall appearance is stable from the prior study. Electronically Signed   By:  Linus Mako.D.  On: 11/17/2015 19:48      Scheduled Meds: . acidophilus  1 capsule Oral Daily  . ALPRAZolam  0.5 mg Oral TID  . buPROPion  150 mg Oral Daily  . enoxaparin (LOVENOX) injection  40 mg Subcutaneous Q24H  . feeding supplement (ENSURE ENLIVE)  237 mL Oral BID BM  . gabapentin  600 mg Oral TID  . loperamide  2 mg Oral BID  . methimazole  10 mg Oral BID  . metoprolol tartrate  25 mg Oral BID  . oxyCODONE  5 mg Oral Q8H  . polyethylene glycol-electrolytes  4,000 mL Oral Once   Continuous Infusions: . sodium chloride       LOS: 9 days    Time spent: 32mnutes   JDessa Phi DO [facility] [email or web address] Password [email or web address] 11/19/2015, 9:40 AM

## 2015-11-20 ENCOUNTER — Encounter (HOSPITAL_COMMUNITY): Payer: Self-pay

## 2015-11-20 ENCOUNTER — Inpatient Hospital Stay (HOSPITAL_COMMUNITY): Payer: Medicare Other | Admitting: Certified Registered"

## 2015-11-20 ENCOUNTER — Encounter (HOSPITAL_COMMUNITY)
Admission: EM | Disposition: A | Discharge status: Skilled Nursing Facility | Payer: Self-pay | Source: Home / Self Care | Attending: Internal Medicine

## 2015-11-20 DIAGNOSIS — N289 Disorder of kidney and ureter, unspecified: Secondary | ICD-10-CM

## 2015-11-20 HISTORY — PX: FLEXIBLE SIGMOIDOSCOPY: SHX5431

## 2015-11-20 LAB — CBC WITH DIFFERENTIAL/PLATELET
Basophils Absolute: 0 10*3/uL (ref 0.0–0.1)
Basophils Relative: 0 %
Eosinophils Absolute: 0.1 10*3/uL (ref 0.0–0.7)
Eosinophils Relative: 2 %
HEMATOCRIT: 24.9 % — AB (ref 36.0–46.0)
HEMOGLOBIN: 7.7 g/dL — AB (ref 12.0–15.0)
LYMPHS ABS: 0.9 10*3/uL (ref 0.7–4.0)
Lymphocytes Relative: 31 %
MCH: 29.1 pg (ref 26.0–34.0)
MCHC: 30.9 g/dL (ref 30.0–36.0)
MCV: 94 fL (ref 78.0–100.0)
MONO ABS: 0.4 10*3/uL (ref 0.1–1.0)
MONOS PCT: 15 %
NEUTROS ABS: 1.5 10*3/uL — AB (ref 1.7–7.7)
NEUTROS PCT: 52 %
Platelets: 188 10*3/uL (ref 150–400)
RBC: 2.65 MIL/uL — ABNORMAL LOW (ref 3.87–5.11)
RDW: 17 % — ABNORMAL HIGH (ref 11.5–15.5)
WBC: 2.8 10*3/uL — ABNORMAL LOW (ref 4.0–10.5)

## 2015-11-20 LAB — BASIC METABOLIC PANEL
ANION GAP: 7 (ref 5–15)
BUN: 11 mg/dL (ref 4–21)
BUN: 11 mg/dL (ref 6–20)
CHLORIDE: 98 mmol/L — AB (ref 101–111)
CO2: 32 mmol/L (ref 22–32)
CREATININE: 1.1 mg/dL (ref 0.5–1.1)
Calcium: 8 mg/dL — ABNORMAL LOW (ref 8.9–10.3)
Creatinine, Ser: 1.08 mg/dL — ABNORMAL HIGH (ref 0.44–1.00)
GFR calc non Af Amer: 49 mL/min — ABNORMAL LOW (ref >60)
GFR, EST AFRICAN AMERICAN: 56 mL/min — AB (ref >60)
GLUCOSE: 88 mg/dL (ref 65–99)
Glucose: 88 mg/dL
Potassium: 4.5 mmol/L (ref 3.5–5.1)
SODIUM: 137 mmol/L (ref 137–147)
Sodium: 137 mmol/L (ref 135–145)

## 2015-11-20 LAB — CBC AND DIFFERENTIAL: WBC: 2.8 10^3/mL

## 2015-11-20 SURGERY — SIGMOIDOSCOPY, FLEXIBLE
Anesthesia: Monitor Anesthesia Care

## 2015-11-20 MED ORDER — METOPROLOL TARTRATE 25 MG PO TABS: 25.0000 mg | ORAL_TABLET | Freq: Two times a day (BID) | ORAL | 0 refills | Status: DC

## 2015-11-20 MED ORDER — LIDOCAINE HCL (CARDIAC) 20 MG/ML IV SOLN
INTRAVENOUS | Status: DC | PRN
  Administered 2015-11-20: 100 mg via INTRATRACHEAL

## 2015-11-20 MED ORDER — OXYCODONE HCL 5 MG/5ML PO SOLN: 5.0000 mg | Freq: Once | ORAL | Status: DC | PRN

## 2015-11-20 MED ORDER — PROPOFOL 500 MG/50ML IV EMUL
INTRAVENOUS | Status: DC | PRN
  Administered 2015-11-20: 75 ug/kg/min via INTRAVENOUS

## 2015-11-20 MED ORDER — PHENYLEPHRINE HCL 10 MG/ML IJ SOLN
INTRAVENOUS | Status: DC | PRN
  Administered 2015-11-20: 60 ug/min via INTRAVENOUS

## 2015-11-20 MED ORDER — FENTANYL CITRATE (PF) 100 MCG/2ML IJ SOLN: 25.0000 ug | INTRAMUSCULAR | Status: DC | PRN

## 2015-11-20 MED ORDER — OXYCODONE HCL 5 MG PO TABS: 5.0000 mg | ORAL_TABLET | Freq: Once | ORAL | Status: DC | PRN

## 2015-11-20 MED ORDER — PROPOFOL 10 MG/ML IV BOLUS
INTRAVENOUS | Status: DC | PRN
  Administered 2015-11-20: 30 mg via INTRAVENOUS

## 2015-11-20 MED ORDER — PHENYLEPHRINE HCL 10 MG/ML IJ SOLN
INTRAMUSCULAR | Status: DC | PRN
  Administered 2015-11-20 (×2): 200 ug via INTRAVENOUS

## 2015-11-20 MED ORDER — METHIMAZOLE 10 MG PO TABS: 10.0000 mg | ORAL_TABLET | Freq: Two times a day (BID) | ORAL | 0 refills | Status: DC

## 2015-11-20 MED ORDER — ONDANSETRON HCL 4 MG/2ML IJ SOLN: 4.0000 mg | Freq: Once | INTRAMUSCULAR | Status: DC | PRN

## 2015-11-20 MED ORDER — LACTATED RINGERS IV SOLN
INTRAVENOUS | Status: DC
  Administered 2015-11-20: 10:00:00 via INTRAVENOUS

## 2015-11-20 NOTE — Progress Notes (Signed)
Patient will DC to: [facility] Anticipated DC date: 11/20/15 Family notified: Daughter Transport by: Corey Harold   Per MD patient ready for DC to [facility]. RN, patient, patient's family, and facility notified of DC. Discharge Summary sent to facility. RN given number for report. DC packet on chart. Ambulance transport requested for patient.   CSW signing off.  Cedric Fishman, [place] Social Worker [phone]

## 2015-11-20 NOTE — Progress Notes (Signed)
Pasrr: [phone] A   Neysa Hotter [phone]

## 2015-11-20 NOTE — Discharge Summary (Signed)
Physician Discharge Summary  Dawn [name] PYP:[id number] DOB: 01-21-1940 DOA: 11/10/2015  PCP: [name], MD  Admit date: 11/10/2015 Discharge date: 11/20/2015  Admitted From: Home Disposition:  SNF, [facility]   Recommendations for Outpatient Follow-up:  1. Follow up with PCP in 1 week 2. Follow up with scheduled outpatient Endocrinology appointment 11/13 for hyperthyroidism 3. Follow up with cardiology in 2 weeks for SVT  4. Follow up on 3.54.5 cm collection just lateral to the rectosigmoid, ovarian cyst vs fluid collection vs redundant loop of colon. We would recommend further evaluation with pelvic ultrasound as an outpatient.   Home Health: No  Equipment/Devices: None   Discharge Condition: Stable CODE STATUS: Full  Diet recommendation: regular   Brief/Interim Summary: From H&P: Patient is a 75 y.o.female with past medical history of anxiety and depression, recently treated in the outpatient setting for a UTI with Macrobid, admitted for evaluation of acute encephalopathy. Per family, patient has been lethargic, with no significant oral intake for at least 2 weeks prior to this admission. She is also had some intermittent nausea,vomiting and diarrhea. She was found to be febrile, with low blood pressures and admitted to the hospital with empiric antibiotics and aggressive IV fluid resuscitation. She has now stabilized, however continued to have fever. Culture data and stool studies are unrevealing so far. Hospital course complicated by development of SVT and new diagnosis of hyperthyroidism.   Interim: Patient had fever and hypotension during initial part of her hospitalization, initially thought to be due to UTI or diarrhea. She was treated with vancomycin and Rocephin. Stool studies, blood cultures were negative. Infectious disease was consulted. Antibiotics were discontinued on 11/3. Patient was doing well until 11/6, and she had a fever. At that time, CT scan of her  chest and abdomen was completed. This showed a 3.5 x 4.5 cm collection of fluid vs redundant loop of bowel just lateral to the rectal sigmoid. GI and IR were also consulted. Repeat CT with oral contrast, 11/7 showed similar appearance. Patient underwent sigmoidoscopy on 11/8, which was a poor prep. She underwent a repeat flexible sigmoidoscopy on 11/10, which showed diverticuli as well as internal structure. IR was contacted again and they felt that this was likely secondary to ovarian cyst. He recommended an outpatient pelvic ultrasound for further evaluation. No intervention needed as inpatient. As patient continued to be afebrile for many days and was doing well without any antibiotics.   Patient was treated with beta blocker as well as methimazole for hyperthyroidism. She has a follow-up endocrinology appointment that is set up. For SVT, cardiology was consulted. Rate control was difficult as patient also had hypotension. She was treated with beta blocker and short course of digoxin, with improvement in rate.   Subjective on day of discharge: Doing well, no complaints. Tolerating lunch. Denies any fevers, chills, Cp, SOB, abdominal pain.   Discharge Diagnoses:  Principal Problem:   Acute kidney injury ([place]) Active Problems:   Acute delirium   Normocytic anemia   Anxiety and depression   Chronic pain syndrome   Recurrent UTI   Acute respiratory failure with hypoxia (HCC)   COPD (chronic obstructive pulmonary disease) (HCC)   Acute hyperglycemia   CKD (chronic kidney disease) stage 3, GFR 30-59 ml/min   Narrow complex tachycardia (HCC)   HTN (hypertension)   Anemia   Delirium   Hypoxemia   Tachycardia   Generalized weakness   Septic shock (HCC)   Acute encephalopathy   Hypokalemia   Sepsis (  [place])   Hyperthyroidism   Hypotension   Premature atrial contractions   Functional diarrhea   Intra-abdominal fluid collection   Protein-calorie malnutrition, severe   Renal  insufficiency   Acute encephalopathy: Resolved. Likely metabolic encephalopathy in a setting of sepsis, dehydration and hyperthyroidism. CT head on 10/31 negative for acute abnormalities.  ?Sepsis: Fever and hypotension during the early part of her hospital stay-initially thought to be sepsis due to UTI or diarrhea-and treated with vancomycin and Rocephin. Stool studies negative, blood cultures negative (one set of blood culture positive for coag-negative staph-thought to be a contaminant). Infectious disease was consulted, all antibiotics were discontinued on 11/3, she was doing well until 11/6 when she had a fever of 102.8. This prompted a CT scan of her chest and abdomen, which showed a 3.54.5 cm collection just lateral to the rectosigmoid. GI and IR were consulted. Repeat CT with oral contrast 11/7 with similar appearance, redundant loop of colon vs fluid collection. Sigmoidoscopy on 11/8 with diverticulosis but poor prep and sigmoidoscopy was repeated on 11/10 which found diverticulosis in the recto-sigmoid colon and in the sigmoid colon and benign-appearing intrinsic stricture in the sigmoid colon. Spoke with IR on call who reviewed images again; he felt this was likely an ovarian cyst and recommended outpatient follow up with pelvis ultrasound. Patient has been afebrile for the past 4 days without antibiotics.   RJJ:OACZYS provoked by acute illness and newly diagnosed hyperthyroidism. Echo with EF around 45-50%. Was on digoxin for a short period of time. Will discontinue digoxin and continue metoprolol at time of discharge. Appreciate cardiology.  Acute kidney injury:Likely mild prerenal azotemia setting of sepsis/UTI/poor oral intake. Improved at time of discharge.   Hyperthyroidism:New diagnosis. Likely the cause of diarrhea/hypovolumia and SVT. Patient started on Tapazole and metoprolol. Will need to follow up with scheduled outpatient Endo appointment 11/13  Diarrhea: Probably  related to hyperthyroidism. Stool studies looking C. difficile and GI pathogen panel are negative.Continue scheduled Imodium and add a probiotic. Currently started on Tapazole-hopefully diarrhea was slowly abate with continued treatment of underlying hyperthyroidism.   Anemia: Appears to have chronic anemia at baseline-suspect acute drop in hemoglobin due to IV fluid dilution, acute illness. Transfused 1 unit of PRBC on 11/1-hemoglobin stable.   Mild Vol Overload:likely 2/2 aggressive IVF resuscitation. Weight 128lb --> 137lb --> 132lb at time of discharge. Now improved.   Anxiety and depression: Continue Xanax and resume Wellbutrin.   History of chronic back pain:Have resumed narcotics as no longer with hypotension and lethargy.  Chronic hypotension: Per family (daughter) patient'sblood pressure is mostly in the 06T systolic range-random cortisol levels appear appropriate. Will hold diovan.    Discharge Instructions  Discharge Instructions    Diet - low sodium heart healthy    Complete by:  As directed    Increase activity slowly    Complete by:  As directed        Medication List    STOP taking these medications   valsartan 160 MG tablet Commonly known as:  DIOVAN     TAKE these medications   ALPRAZolam 1 MG tablet Commonly known as:  XANAX Take 1 mg by mouth 3 (three) times daily as needed for anxiety.   buPROPion 150 MG 24 hr tablet Commonly known as:  WELLBUTRIN XL Take 150 mg by mouth daily.   FERROCITE 324 (106 Fe) MG Tabs tablet Generic drug:  Ferrous Fumarate Take 324 mg by mouth daily.   gabapentin 600 MG tablet Commonly known as:  NEURONTIN Take 600 mg by mouth 3 (three) times daily.   INCRUSE ELLIPTA 62.5 MCG/INH Aepb Generic drug:  umeclidinium bromide Inhale 1 puff into the lungs daily.   methimazole 10 MG tablet Commonly known as:  TAPAZOLE Take 1 tablet (10 mg total) by mouth 2 (two) times daily.   metoprolol tartrate 25 MG  tablet Commonly known as:  LOPRESSOR Take 1 tablet (25 mg total) by mouth 2 (two) times daily.   Oxycodone HCl 10 MG Tabs Take 10 mg by mouth every 8 (eight) hours.   traZODone 100 MG tablet Commonly known as:  DESYREL Take 100 mg by mouth at bedtime.       Contact information for follow-up providers    REESE,BETTI D, MD. Schedule an appointment as soon as possible for a visit in 1 week(s).   Specialty:  Family Medicine Contact information: [address] [place] [address] [phone]        [facility], MD Follow up on 11/23/2015.   Specialty:  Endocrinology Why:  appointment at 7:45 am, please get there 10-15 minutes before. Contact information: [place] [address] [phone]        Henry W Smith III, MD. Schedule an appointment as soon as possible for a visit in 1 week(s).   Specialty:  Cardiology Contact information: [address] [place] [address] [phone]            Contact information for after-discharge care    Destination    [facility] Follow up.   Specialty:  Skilled Nursing Facility Contact information: [address] [place] [phone]                 No Known Allergies  Consultations:  Cardiology  PCCM  Infectious disease  GI   IR  Procedures/Studies: Ct Abdomen Pelvis Wo Contrast  Result Date: 11/17/2015 CLINICAL DATA:  Possible diverticular abscess EXAM: CT ABDOMEN AND PELVIS WITHOUT CONTRAST TECHNIQUE: Multidetector CT imaging of the abdomen and pelvis was performed following the standard protocol without IV contrast. COMPARISON:  11/16/2015 FINDINGS: Lower chest: Mild emphysematous changes are noted. No sizable effusion is seen. Hepatobiliary: The gallbladder is decompressed. The liver is within normal limits. Pancreas: Unremarkable. No pancreatic ductal dilatation or surrounding inflammatory  changes. Spleen: Normal in size without focal abnormality. Adrenals/Urinary Tract: Adrenals are within normal limits. Stable renal cysts are noted bilaterally. The bladder is partially decompressed. Stomach/Bowel: Scattered diverticular change of the colon is noted. Some sigmoid wall thickening is seen. The mottled air collection is again identified just anterior to the rectum and slightly eccentric to the left. The contrast load in this region is not appreciable. Again this may represent a redundant loop of sigmoid although the possibility of an extraluminal collection remains. Vascular/Lymphatic: Aortic atherosclerosis. No enlarged abdominal or pelvic lymph nodes. Reproductive: Left ovarian cyst is again seen. History ectomy is been previously performed. Other: No abdominal wall hernia or abnormality. No abdominopelvic ascites. Musculoskeletal: No acute or significant osseous findings. IMPRESSION: Stable changes in the region of the rectosigmoid. This may simply represent redundant loop of colon although imaging was performed prior to contrast reaching the distal colon. The overall appearance is stable from the prior study. Electronically Signed   By: Inez Catalina M.D.   On: 11/17/2015 19:48   Ct Abdomen Pelvis Wo Contrast  Addendum Date: 11/16/2015   ADDENDUM REPORT: 11/16/2015 16:51 ADDENDUM: These results were  called by telephone at the time of interpretation on 11/16/2015 at 4:51 pm to Dr. Oren Binet , who verbally acknowledged these results. Electronically Signed   By: Marin Olp M.D.   On: 11/16/2015 16:51   Result Date: 11/16/2015 CLINICAL DATA:  Fever overnight. EXAM: CT CHEST, ABDOMEN AND PELVIS WITHOUT CONTRAST TECHNIQUE: Multidetector CT imaging of the chest, abdomen and pelvis was performed following the standard protocol without IV contrast. COMPARISON:  Chest CT 03/19/2009 FINDINGS: CT CHEST FINDINGS Cardiovascular: Mild cardiomegaly. Calcified plaque over the left anterior descending  and lateral circumflex coronary arteries. Calcified plaque over the thoracic aorta. Ascending thoracic aorta measures 3.4 cm in AP diameter. Mediastinum/Nodes: No evidence of mediastinal or hilar adenopathy. Remaining mediastinal structures are within normal. Lungs/Pleura: Lungs are well inflated demonstrate mild to moderate centrilobular emphysematous disease. There is mild bibasilar fibrotic change which has progressed compared to the previous exam. Tiny amount of left pleural fluid. Mild right base atelectasis. Airways within normal. Musculoskeletal: Degenerative change of the spine. CT ABDOMEN PELVIS FINDINGS Hepatobiliary: Gallbladder is contracted.  Liver is within normal. Pancreas: Within normal. Spleen: Within normal. Adrenals/Urinary Tract: Adrenal glands are within normal. Kidneys normal size with multiple bilateral cysts with the largest over the lower pole left kidney measuring 6.6 cm. Ureters are within normal. Bladder demonstrates mild circumferential wall thickening which may be due to underdistention although can be seen with cystitis. Stomach/Bowel: Stomach is within normal. Small bowel is normal. Appendix is normal. Mild fecal retention throughout the colon. There is wall thickening of the sigmoid colon as it courses by the left adnexa. Minimal stranding of the adjacent pericolonic fat. There is a collection of air and mottled lucency which appears to be abutting the left lateral side of the rectosigmoid colon measuring approximately 3.5 x 4.5 cm as this may represent a redundant loop of rectosigmoid colon versus a contained perforation of the rectosigmoid colon. There is no free peritoneal air visualized. Few small lymph nodes in the left perirectal region. These findings could be due to a chronic diverticulitis with possible contain perforation versus contain perforation of rectosigmoid carcinoma with secondary inflammatory change. Vascular/Lymphatic: Moderate calcified plaque over the abdominal  aorta. Reproductive: Suggestion of previous hysterectomy. 3.6 cm oval left adnexal cyst likely ovarian cyst. Right ovary within normal. Musculoskeletal: Moderate degenerative changes of the spine and mild degenerate change of the hips. Mild biphasic curvature of the spine. IMPRESSION: No acute findings in the chest. Moderate emphysematous disease with mild bibasilar fibrotic change demonstrating interval progression. Wall thickening of the rectosigmoid colon with mild adjacent inflammatory change and minimal associated adjacent small lymph nodes. Collection of air mottled lucency along the left lateral aspect of the rectosigmoid colon measuring 3.5 x 4.5 cm. This collection may represent a redundant rectosigmoid loop versus an extraluminal contained perforation either from colon carcinoma or chronic diverticulitis. Consider delayed images as better colonic contrast may help in interpretation of this finding. Mild cardiomegaly and evidence of atherosclerotic coronary artery disease. Aortic atherosclerosis. Bilateral renal cysts with the largest measuring 6.6 cm over the lower pole left kidney. Next Possible 3.6 cm left ovarian cyst. Electronically Signed: By: Marin Olp M.D. On: 11/16/2015 16:42   Dg Chest 2 View  Result Date: 11/10/2015 CLINICAL DATA:  Hypoxemia EXAM: CHEST  2 VIEW COMPARISON:  03/19/2009 FINDINGS: Cardiac shadow remains enlarged. Aortic calcifications are noted. The lungs demonstrate scattered interstitial changes without focal infiltrate. Some scattered scarring is noted in the left lung base stable from the prior exam. No  acute infiltrate or sizable effusion is noted. Old rib fractures on the right and old right clavicular fracture are noted. IMPRESSION: Chronic changes without acute abnormality. Electronically Signed   By: Inez Catalina M.D.   On: 11/10/2015 14:31   Ct Head Wo Contrast  Result Date: 11/10/2015 CLINICAL DATA:  Increasing confusion EXAM: CT HEAD WITHOUT CONTRAST  TECHNIQUE: Contiguous axial images were obtained from the base of the skull through the vertex without intravenous contrast. COMPARISON:  None. FINDINGS: Brain: There is no intracranial hemorrhage, mass or evidence of acute infarction. There is mild generalized atrophy. There is mild chronic microvascular ischemic change. There is no significant extra-axial fluid collection. No acute intracranial findings are evident. The calvarium and skullbase are intact. Visible paranasal sinuses and orbits are unremarkable. Vascular: No hyperdense vessel or unexpected calcification. Skull: Normal. Negative for fracture or focal lesion. Sinuses/Orbits: Left maxillary sinus air-fluid level IMPRESSION: No acute intracranial findings. There is moderate generalized atrophy and chronic appearing white matter hypodensities which likely represent small vessel ischemic disease. Electronically Signed   By: Andreas Newport M.D.   On: 11/10/2015 04:05   Ct Chest Wo Contrast  Addendum Date: 11/16/2015   ADDENDUM REPORT: 11/16/2015 16:51 ADDENDUM: These results were called by telephone at the time of interpretation on 11/16/2015 at 4:51 pm to Dr. Oren Binet , who verbally acknowledged these results. Electronically Signed   By: Marin Olp M.D.   On: 11/16/2015 16:51   Result Date: 11/16/2015 CLINICAL DATA:  Fever overnight. EXAM: CT CHEST, ABDOMEN AND PELVIS WITHOUT CONTRAST TECHNIQUE: Multidetector CT imaging of the chest, abdomen and pelvis was performed following the standard protocol without IV contrast. COMPARISON:  Chest CT 03/19/2009 FINDINGS: CT CHEST FINDINGS Cardiovascular: Mild cardiomegaly. Calcified plaque over the left anterior descending and lateral circumflex coronary arteries. Calcified plaque over the thoracic aorta. Ascending thoracic aorta measures 3.4 cm in AP diameter. Mediastinum/Nodes: No evidence of mediastinal or hilar adenopathy. Remaining mediastinal structures are within normal. Lungs/Pleura: Lungs  are well inflated demonstrate mild to moderate centrilobular emphysematous disease. There is mild bibasilar fibrotic change which has progressed compared to the previous exam. Tiny amount of left pleural fluid. Mild right base atelectasis. Airways within normal. Musculoskeletal: Degenerative change of the spine. CT ABDOMEN PELVIS FINDINGS Hepatobiliary: Gallbladder is contracted.  Liver is within normal. Pancreas: Within normal. Spleen: Within normal. Adrenals/Urinary Tract: Adrenal glands are within normal. Kidneys normal size with multiple bilateral cysts with the largest over the lower pole left kidney measuring 6.6 cm. Ureters are within normal. Bladder demonstrates mild circumferential wall thickening which may be due to underdistention although can be seen with cystitis. Stomach/Bowel: Stomach is within normal. Small bowel is normal. Appendix is normal. Mild fecal retention throughout the colon. There is wall thickening of the sigmoid colon as it courses by the left adnexa. Minimal stranding of the adjacent pericolonic fat. There is a collection of air and mottled lucency which appears to be abutting the left lateral side of the rectosigmoid colon measuring approximately 3.5 x 4.5 cm as this may represent a redundant loop of rectosigmoid colon versus a contained perforation of the rectosigmoid colon. There is no free peritoneal air visualized. Few small lymph nodes in the left perirectal region. These findings could be due to a chronic diverticulitis with possible contain perforation versus contain perforation of rectosigmoid carcinoma with secondary inflammatory change. Vascular/Lymphatic: Moderate calcified plaque over the abdominal aorta. Reproductive: Suggestion of previous hysterectomy. 3.6 cm oval left adnexal cyst likely ovarian cyst. Right ovary  within normal. Musculoskeletal: Moderate degenerative changes of the spine and mild degenerate change of the hips. Mild biphasic curvature of the spine.  IMPRESSION: No acute findings in the chest. Moderate emphysematous disease with mild bibasilar fibrotic change demonstrating interval progression. Wall thickening of the rectosigmoid colon with mild adjacent inflammatory change and minimal associated adjacent small lymph nodes. Collection of air mottled lucency along the left lateral aspect of the rectosigmoid colon measuring 3.5 x 4.5 cm. This collection may represent a redundant rectosigmoid loop versus an extraluminal contained perforation either from colon carcinoma or chronic diverticulitis. Consider delayed images as better colonic contrast may help in interpretation of this finding. Mild cardiomegaly and evidence of atherosclerotic coronary artery disease. Aortic atherosclerosis. Bilateral renal cysts with the largest measuring 6.6 cm over the lower pole left kidney. Next Possible 3.6 cm left ovarian cyst. Electronically Signed: By: Marin Olp M.D. On: 11/16/2015 16:42    Sigmoidoscopy 11/8 Impression:       - Preparation of the colon was poor.                           - Diverticulosis in the recto-sigmoid colon.                           - Stool in the recto-sigmoid colon.                           - The examination was otherwise normal.                           - No specimens collected.  Sigmoidoscopy 11/10 Impression:       - Diverticulosis in the recto-sigmoid colon and in                            the sigmoid colon.                           - Stricture in the sigmoid colon.                           - The examination was otherwise normal.                           - No specimens collected.   Discharge Exam: Vitals:   11/20/15 1050 11/20/15 1157  BP: (!) 108/50 114/64  Pulse: 76 71  Resp: (!) 26   Temp:     Vitals:   11/20/15 1035 11/20/15 1040 11/20/15 1050 11/20/15 1157  BP: (!) 98/49 111/60 (!) 108/50 114/64  Pulse: 82 80 76 71  Resp: 16 20 (!) 26   Temp: 97.8 F (36.6 C)     TempSrc: Oral     SpO2: 98% 99% 100%    Weight:      Height:       General exam: Appears calm and comfortable  Respiratory system: Clear to auscultation. Respiratory effort normal. Cardiovascular system: S1 & S2 heard, RRR. No JVD, murmurs, rubs, gallops or clicks. Trace pedal edema. Gastrointestinal system: Abdomen is nondistended, soft and nontender. No organomegaly or masses felt. Normal bowel sounds heard. Central nervous system: Alert and oriented. No focal neurological deficits.  Extremities: Symmetric 5 x 5 power. Skin: No rashes, lesions or ulcers Psychiatry: Judgement and insight appear normal. Mood & affect appropriate.     The results of significant diagnostics from this hospitalization (including imaging, microbiology, ancillary and laboratory) are listed below for reference.     Microbiology: Recent Results (from the past 240 hour(s))  C difficile quick scan w PCR reflex     Status: None   Collection Time: 11/11/15  4:54 PM  Result Value Ref Range Status   C Diff antigen NEGATIVE NEGATIVE Final   C Diff toxin NEGATIVE NEGATIVE Final   C Diff interpretation No C. difficile detected.  Final  Gastrointestinal Panel by PCR , Stool     Status: None   Collection Time: 11/11/15  4:54 PM  Result Value Ref Range Status   Campylobacter species NOT DETECTED NOT DETECTED Final   Plesimonas shigelloides NOT DETECTED NOT DETECTED Final   Salmonella species NOT DETECTED NOT DETECTED Final   Yersinia enterocolitica NOT DETECTED NOT DETECTED Final   Vibrio species NOT DETECTED NOT DETECTED Final   Vibrio cholerae NOT DETECTED NOT DETECTED Final   Enteroaggregative E coli (EAEC) NOT DETECTED NOT DETECTED Final   Enteropathogenic E coli (EPEC) NOT DETECTED NOT DETECTED Final   Enterotoxigenic E coli (ETEC) NOT DETECTED NOT DETECTED Final   Shiga like toxin producing E coli (STEC) NOT DETECTED NOT DETECTED Final   Shigella/Enteroinvasive E coli (EIEC) NOT DETECTED NOT DETECTED Final   Cryptosporidium NOT DETECTED NOT  DETECTED Final   Cyclospora cayetanensis NOT DETECTED NOT DETECTED Final   Entamoeba histolytica NOT DETECTED NOT DETECTED Final   Giardia lamblia NOT DETECTED NOT DETECTED Final   Adenovirus F40/41 NOT DETECTED NOT DETECTED Final   Astrovirus NOT DETECTED NOT DETECTED Final   Norovirus GI/GII NOT DETECTED NOT DETECTED Final   Rotavirus A NOT DETECTED NOT DETECTED Final   Sapovirus (I, II, IV, and V) NOT DETECTED NOT DETECTED Final  MRSA PCR Screening     Status: None   Collection Time: 11/11/15 11:43 PM  Result Value Ref Range Status   MRSA by PCR NEGATIVE NEGATIVE Final    Comment:        The GeneXpert MRSA Assay (FDA approved for NASAL specimens only), is one component of a comprehensive MRSA colonization surveillance program. It is not intended to diagnose MRSA infection nor to guide or monitor treatment for MRSA infections.   Culture, blood (routine x 2)     Status: None (Preliminary result)   Collection Time: 11/16/15  9:35 AM  Result Value Ref Range Status   Specimen Description BLOOD LEFT ARM  Final   Special Requests IN PEDIATRIC BOTTLE 2CC  Final   Culture NO GROWTH 4 DAYS  Final   Report Status PENDING  Incomplete  Culture, blood (routine x 2)     Status: None (Preliminary result)   Collection Time: 11/16/15  9:38 AM  Result Value Ref Range Status   Specimen Description BLOOD RIGHT HAND  Final   Special Requests IN PEDIATRIC BOTTLE 1CC  Final   Culture NO GROWTH 4 DAYS  Final   Report Status PENDING  Incomplete     Labs: BNP (last 3 results) No results for input(s): BNP in the last 8760 hours. Basic Metabolic Panel:  Recent Labs Lab 11/14/15 0556  11/16/15 0933 11/17/15 0827 11/18/15 0600 11/19/15 0428 11/20/15 0558  NA 139  < > 136 138 135 138 137  K 3.0*  < > 3.9 4.3 4.6  4.7 4.5  CL 109  < > 105 101 97* 97* 98*  CO2 24  < > '23 30 29 '$ 35* 32  GLUCOSE 86  < > 146* 94 107* 88 88  BUN 11  < > '11 11 17 14 11  '$ CREATININE 0.90  < > 1.15* 1.14* 1.39*  1.19* 1.08*  CALCIUM 7.3*  < > 8.0* 8.4* 8.3* 8.1* 8.0*  MG 1.9  --   --   --   --  2.4  --   PHOS 3.2  --   --   --   --   --   --   < > = values in this interval not displayed. Liver Function Tests:  Recent Labs Lab 11/14/15 0556  ALBUMIN 1.6*   No results for input(s): LIPASE, AMYLASE in the last 168 hours. No results for input(s): AMMONIA in the last 168 hours. CBC:  Recent Labs Lab 11/14/15 0556 11/15/15 0541 11/16/15 0933 11/18/15 0600 11/19/15 0428 11/20/15 0558  WBC 4.3 5.1 4.4 5.3 4.8 2.8*  NEUTROABS 2.7  --   --   --  3.3 1.5*  HGB 7.3* 8.0* 8.1* 8.2* 8.6* 7.7*  HCT 23.4* 26.8* 27.1* 26.6* 29.0* 24.9*  MCV 91.4 92.7 93.8 93.3 94.8 94.0  PLT 178 214 194 239 225 188   Cardiac Enzymes: No results for input(s): CKTOTAL, CKMB, CKMBINDEX, TROPONINI in the last 168 hours. BNP: Invalid input(s): POCBNP CBG: No results for input(s): GLUCAP in the last 168 hours. D-Dimer No results for input(s): DDIMER in the last 72 hours. Hgb A1c No results for input(s): HGBA1C in the last 72 hours. Lipid Profile No results for input(s): CHOL, HDL, LDLCALC, TRIG, CHOLHDL, LDLDIRECT in the last 72 hours. Thyroid function studies No results for input(s): TSH, T4TOTAL, T3FREE, THYROIDAB in the last 72 hours.  Invalid input(s): FREET3 Anemia work up No results for input(s): VITAMINB12, FOLATE, FERRITIN, TIBC, IRON, RETICCTPCT in the last 72 hours. Urinalysis    Component Value Date/Time   COLORURINE AMBER (A) 11/10/2015 0230   APPEARANCEUR CLEAR 11/10/2015 0230   LABSPEC 1.018 11/10/2015 0230   PHURINE 6.0 11/10/2015 0230   GLUCOSEU NEGATIVE 11/10/2015 0230   HGBUR NEGATIVE 11/10/2015 0230   BILIRUBINUR SMALL (A) 11/10/2015 0230   KETONESUR 15 (A) 11/10/2015 0230   PROTEINUR 30 (A) 11/10/2015 0230   UROBILINOGEN 0.2 04/29/2008 2235   NITRITE NEGATIVE 11/10/2015 0230   LEUKOCYTESUR SMALL (A) 11/10/2015 0230   Sepsis Labs Invalid input(s): PROCALCITONIN,  WBC,   LACTICIDVEN Microbiology Recent Results (from the past 240 hour(s))  C difficile quick scan w PCR reflex     Status: None   Collection Time: 11/11/15  4:54 PM  Result Value Ref Range Status   C Diff antigen NEGATIVE NEGATIVE Final   C Diff toxin NEGATIVE NEGATIVE Final   C Diff interpretation No C. difficile detected.  Final  Gastrointestinal Panel by PCR , Stool     Status: None   Collection Time: 11/11/15  4:54 PM  Result Value Ref Range Status   Campylobacter species NOT DETECTED NOT DETECTED Final   Plesimonas shigelloides NOT DETECTED NOT DETECTED Final   Salmonella species NOT DETECTED NOT DETECTED Final   Yersinia enterocolitica NOT DETECTED NOT DETECTED Final   Vibrio species NOT DETECTED NOT DETECTED Final   Vibrio cholerae NOT DETECTED NOT DETECTED Final   Enteroaggregative E coli (EAEC) NOT DETECTED NOT DETECTED Final   Enteropathogenic E coli (EPEC) NOT DETECTED NOT DETECTED Final   Enterotoxigenic  E coli (ETEC) NOT DETECTED NOT DETECTED Final   Shiga like toxin producing E coli (STEC) NOT DETECTED NOT DETECTED Final   Shigella/Enteroinvasive E coli (EIEC) NOT DETECTED NOT DETECTED Final   Cryptosporidium NOT DETECTED NOT DETECTED Final   Cyclospora cayetanensis NOT DETECTED NOT DETECTED Final   Entamoeba histolytica NOT DETECTED NOT DETECTED Final   Giardia lamblia NOT DETECTED NOT DETECTED Final   Adenovirus F40/41 NOT DETECTED NOT DETECTED Final   Astrovirus NOT DETECTED NOT DETECTED Final   Norovirus GI/GII NOT DETECTED NOT DETECTED Final   Rotavirus A NOT DETECTED NOT DETECTED Final   Sapovirus (I, II, IV, and V) NOT DETECTED NOT DETECTED Final  MRSA PCR Screening     Status: None   Collection Time: 11/11/15 11:43 PM  Result Value Ref Range Status   MRSA by PCR NEGATIVE NEGATIVE Final    Comment:        The GeneXpert MRSA Assay (FDA approved for NASAL specimens only), is one component of a comprehensive MRSA colonization surveillance program. It is  not intended to diagnose MRSA infection nor to guide or monitor treatment for MRSA infections.   Culture, blood (routine x 2)     Status: None (Preliminary result)   Collection Time: 11/16/15  9:35 AM  Result Value Ref Range Status   Specimen Description BLOOD LEFT ARM  Final   Special Requests IN PEDIATRIC BOTTLE 2CC  Final   Culture NO GROWTH 4 DAYS  Final   Report Status PENDING  Incomplete  Culture, blood (routine x 2)     Status: None (Preliminary result)   Collection Time: 11/16/15  9:38 AM  Result Value Ref Range Status   Specimen Description BLOOD RIGHT HAND  Final   Special Requests IN PEDIATRIC BOTTLE [place]  Final   Culture NO GROWTH 4 DAYS  Final   Report Status PENDING  Incomplete     Time coordinating discharge: Over 30 minutes  SIGNED:  Dessa Phi, DO [facility] Pager [phone]  If 7PM-7AM, please contact night-coverage [email or web address] Password [email or web address] 11/20/2015, 1:43 PM

## 2015-11-20 NOTE — Progress Notes (Signed)
Nsg Discharge Note  Admit Date:  11/10/2015 Discharge date: 11/20/2015   Arnaldo Natal Tsang to be D/C'd [place] facility per MD order.  Report called to Caren Griffins at [facility] Discharge Medication:   Medication List    STOP taking these medications   valsartan 160 MG tablet Commonly known as:  DIOVAN     TAKE these medications   ALPRAZolam 1 MG tablet Commonly known as:  XANAX Take 1 mg by mouth 3 (three) times daily as needed for anxiety.   buPROPion 150 MG 24 hr tablet Commonly known as:  WELLBUTRIN XL Take 150 mg by mouth daily.   FERROCITE 324 (106 Fe) MG Tabs tablet Generic drug:  Ferrous Fumarate Take 324 mg by mouth daily.   gabapentin 600 MG tablet Commonly known as:  NEURONTIN Take 600 mg by mouth 3 (three) times daily.   INCRUSE ELLIPTA 62.5 MCG/INH Aepb Generic drug:  umeclidinium bromide Inhale 1 puff into the lungs daily.   methimazole 10 MG tablet Commonly known as:  TAPAZOLE Take 1 tablet (10 mg total) by mouth 2 (two) times daily.   metoprolol tartrate 25 MG tablet Commonly known as:  LOPRESSOR Take 1 tablet (25 mg total) by mouth 2 (two) times daily.   Oxycodone HCl 10 MG Tabs Take 10 mg by mouth every 8 (eight) hours.   traZODone 100 MG tablet Commonly known as:  DESYREL Take 100 mg by mouth at bedtime.       Discharge Assessment: Vitals:   11/20/15 1050 11/20/15 1157  BP: (!) 108/50 114/64  Pulse: 76 71  Resp: (!) 26   Temp:     Skin clean, dry and intact without evidence of skin break down, no evidence of skin tears noted. IV catheter discontinued intact. Site without signs and symptoms of complications - no redness or edema noted at insertion site, patient denies c/o pain - only slight tenderness at site.  Dressing with slight pressure applied.  D/c Instructions-Education: Patient instructed to return to ED, call 911, or call MD for any changes in condition.  Patient escorted by EMS via stretcher to [address]  [place], [place] 11/20/2015 4:58 PM

## 2015-11-20 NOTE — Care Management Note (Signed)
Case Management Note  Patient Details  Name: Dawn [name] MRN: [id number] Date of Birth: 08/27/1940  Subjective/Objective:    AKI, Acute Delirium, Acute Resp Failure,  COPD             Action/Plan: Discharge Planning: Chart reviewed. CSW following for SNF placement.    Expected Discharge Date:  11/20/2015               Expected Discharge Plan:  [place]  In-House Referral:  Clinical Social Work  Discharge planning Services  CM Consult  Post Acute Care Choice:  NA Choice offered to:  NA  DME Arranged:  N/A DME Agency:  NA  HH Arranged:  NA HH Agency:  NA  Status of Service:  Completed, signed off  If discussed at [employer] of Stay Meetings, dates discussed:    Additional Comments:  Erenest Rasher, RN 11/20/2015, 1:59 PM

## 2015-11-20 NOTE — Transfer of Care (Signed)
Immediate Anesthesia Transfer of Care Note  Patient: Dawn [name]  Procedure(s) Performed: Procedure(s): FLEXIBLE SIGMOIDOSCOPY (N/A)  Patient Location: Endoscopy Unit  Anesthesia Type:MAC  Level of Consciousness: awake and patient cooperative  Airway & Oxygen Therapy: Patient Spontanous Breathing and Patient connected to nasal cannula oxygen  Post-op Assessment: Report given to RN and Post -op Vital signs reviewed and stable  Post vital signs: Reviewed and stable  Last Vitals:  Vitals:   11/20/15 0859 11/20/15 0937  BP: 102/60 119/66  Pulse: 70 74  Resp: 18 16  Temp: 36.7 C     Last Pain:  Vitals:   11/20/15 0859  TempSrc: Oral  PainSc:       Patients Stated Pain Goal: 2 (34/03/52 4818)  Complications: No apparent anesthesia complications

## 2015-11-20 NOTE — Progress Notes (Signed)
Patient Name: Dawn [name] Date of Encounter: 11/20/2015  Primary Cardiologist: Stanford Breed, M.D.  Hospital Problem List     Principal Problem:   Acute kidney injury [facility]) Active Problems:   Acute delirium   Normocytic anemia   Anxiety and depression   Chronic pain syndrome   Recurrent UTI   Acute respiratory failure with hypoxia (HCC)   COPD (chronic obstructive pulmonary disease) (HCC)   Acute hyperglycemia   CKD (chronic kidney disease) stage 3, GFR 30-59 ml/min   Narrow complex tachycardia (HCC)   HTN (hypertension)   Anemia   Delirium   Hypoxemia   Tachycardia   Generalized weakness   Septic shock (HCC)   Acute encephalopathy   Hypokalemia   Sepsis (HCC)   Hyperthyroidism   Hypotension   Premature atrial contractions   Functional diarrhea   Intra-abdominal fluid collection   Protein-calorie malnutrition, severe     Subjective   She voices no complaints.  Inpatient Medications    Scheduled Meds: . acidophilus  1 capsule Oral Daily  . ALPRAZolam  0.5 mg Oral TID  . buPROPion  150 mg Oral Daily  . digoxin  0.0625 mg Oral Daily  . enoxaparin (LOVENOX) injection  40 mg Subcutaneous Q24H  . feeding supplement (ENSURE ENLIVE)  237 mL Oral BID BM  . gabapentin  600 mg Oral TID  . methimazole  10 mg Oral BID  . metoprolol tartrate  25 mg Oral BID  . oxyCODONE  5 mg Oral Q8H   Continuous Infusions: . sodium chloride    . sodium chloride Stopped (11/20/15 0901)   PRN Meds: acetaminophen, levalbuterol, ondansetron **OR** ondansetron (ZOFRAN) IV   Vital Signs    Vitals:   11/19/15 2054 11/19/15 2247 11/20/15 0602 11/20/15 0859  BP: 102/61 (!) 102/59 113/61 102/60  Pulse: 93 79 79 70  Resp: '18  17 18  '$ Temp: 99.2 F (37.3 C)  97.4 F (36.3 C) 98.1 F (36.7 C)  TempSrc: Oral  Oral Oral  SpO2: 92%  97% 99%  Weight:   132 lb 0.9 oz (59.9 kg)   Height:        Intake/Output Summary (Last 24 hours) at 11/20/15 0923 Last data filed at 11/20/15  0901  Gross per 24 hour  Intake          2325.83 ml  Output                0 ml  Net          2325.83 ml   Filed Weights   11/18/15 1029 11/19/15 0453 11/20/15 0602  Weight: 125 lb 10.6 oz (57 kg) 124 lb 9 oz (56.5 kg) 132 lb 0.9 oz (59.9 kg)    Physical Exam    GEN: Elderly and frail and in no acute distress.  HEENT: Grossly normal.  Neck: Supple, no JVD, carotid bruits, or masses. Cardiac: IRR and no murmur is present.  Respiratory:  Respirations regular and unlabored, clear to auscultation bilaterally. GI: Soft, nontender, nondistended, BS + x 4. MS: no deformity or atrophy. Skin: warm and dry, no rash. Neuro:  Decreased hearing. Arousable. More alert today. Psych: AAOx3.  Normal affect.  Labs    CBC  Recent Labs  11/19/15 0428 11/20/15 0558  WBC 4.8 2.8*  NEUTROABS 3.3 1.5*  HGB 8.6* 7.7*  HCT 29.0* 24.9*  MCV 94.8 94.0  PLT 225 578   Basic Metabolic Panel  Recent Labs  11/19/15 0428 11/20/15 0558  NA 138  137  K 4.7 4.5  CL 97* 98*  CO2 35* 32  GLUCOSE 88 88  BUN 14 11  CREATININE 1.19* 1.08*  CALCIUM 8.1* 8.0*  MG 2.4  --      Telemetry    Normal sinus rhythm with much better rate control and many fewer episodes of SVT since starting digoxin. - Personally Reviewed  ECG    Not repeated - Personally Reviewed  Radiology    No results found.  Cardiac Studies   No new data  Patient Profile     75 year old admitted with shock, encephalopathy, UTI, and noted to have supraventricular tachycardia. Also had severe anemia. Also noted to have hyperthyroidism, COPD, and diarrheal illness. Heart rhythm has improved with oral titration of beta blocker therapy  Assessment & Plan    1. Paroxysmal atrial tachycardia, Digoxin has helped to decrease frequency of SVT. Will decrease digoxin to 0.0625 mg daily. As her acute illness resolves, I would recommend discontinuation of digoxin and using only beta blocker. The dose of the beta blocker may be  increased if blood pressure improves with hydration and oral intake. 2. Hypotension, likely related to volume depletion. BP is better today. IV fluids have been stopped. 3. Acute kidney injury, kidney function has improved with hydration.  Signed, Sinclair Grooms, MD  11/20/2015, 9:23 AM

## 2015-11-20 NOTE — Clinical Social Work Placement (Signed)
   CLINICAL SOCIAL WORK PLACEMENT  NOTE  Date:  11/20/2015  Patient Details  Name: Dawn [name] MRN: [id number] Date of Birth: 1940/04/03  Clinical Social Work is seeking post-discharge placement for this patient at the [place] level of care (*CSW will initial, date and re-position this form in  chart as items are completed):  Yes   Patient/family provided with [place] Work Department's list of facilities offering this level of care within the geographic area requested by the patient (or if unable, by the patient's family).  Yes   Patient/family informed of their freedom to choose among providers that offer the needed level of care, that participate in Medicare, Medicaid or managed care program needed by the patient, have an available bed and are willing to accept the patient.  Yes   Patient/family informed of [facility]'s ownership interest in [facility] and [facility], as well as of the fact that they are under no obligation to receive care at these facilities.  PASRR submitted to EDS on 11/18/15     PASRR number received on 11/18/15     Existing PASRR number confirmed on       FL2 transmitted to all facilities in geographic area requested by pt/family on 11/16/15     FL2 transmitted to all facilities within larger geographic area on       Patient informed that his/her managed care company has contracts with or will negotiate with certain facilities, including the following:        Yes   Patient/family informed of bed offers received.  Patient chooses bed at [facility]     Physician recommends and patient chooses bed at      Patient to be transferred to [facility] on 11/20/15.  Patient to be transferred to facility by [facility]     Patient family notified on 11/20/15 of transfer.  Name of family member notified:  Daughter, [name]     PHYSICIAN Please sign FL2     Additional Comment:     _______________________________________________ Benard Halsted, [place] 11/20/2015, 5:04 PM

## 2015-11-20 NOTE — Op Note (Addendum)
[facility] Patient Name: Dawn [name] Procedure Date : 11/20/2015 MRN: [id number] Attending MD: Missy Sabins , MD Date of Birth: Apr 01, 1940 CSN: [id number] Age: 75 Admit Type: Inpatient Procedure:                Flexible Sigmoidoscopy Indications:              Abnormal CT of the GI tract Providers:                Elyse Jarvis. Amedeo Plenty, MD, Malka So, RN, Elspeth Cho Tech., Technician, Lance Coon, CRNA Referring MD:              Medicines:                Propofol per Anesthesia Complications:            No immediate complications. Estimated Blood Loss:     Estimated blood loss: none. Procedure:                Pre-Anesthesia Assessment:                           - Prior to the procedure, a History and Physical                            was performed, and patient medications and                            allergies were reviewed. The patient's tolerance of                            previous anesthesia was also reviewed. The risks                            and benefits of the procedure and the sedation                            options and risks were discussed with the patient.                            All questions were answered, and informed consent                            was obtained. Prior Anticoagulants: The patient has                            taken no previous anticoagulant or antiplatelet                            agents. ASA Grade Assessment: III - A patient with                            severe systemic disease. After reviewing the risks  and benefits, the patient was deemed in                            satisfactory condition to undergo the procedure.                           After obtaining informed consent, the scope was                            passed under direct vision. The Colonoscope was                            introduced through the anus and advanced to the the                    sigmoid colon. The colonoscopy was technically                            difficult and complex due to poor endoscopic                            visualization and restricted mobility of the colon.                            Successful completion of the procedure was aided by                            withdrawing the scope and replacing with the                            pediatric endoscope. The patient tolerated the                            procedure well. The quality of the bowel                            preparation was good. The rectum was photographed.                            After obtaining informed consent, the scope was                            passed under direct vision. Scope In: Scope Out: Findings:      A few diverticula were found in the recto-sigmoid colon and sigmoid       colon.      A benign-appearing, intrinsic stenosis was found in the sigmoid colon       and was non-traversed.      The exam was otherwise without abnormality. Impression:               - Diverticulosis in the recto-sigmoid colon and in                            the sigmoid colon.                           -  Stricture in the sigmoid colon.                           - The examination was otherwise normal.                           - No specimens collected. Moderate Sedation:      no moderate sedation Recommendation:           - Refer to an interventional radiologist at                            appointment to be scheduled.                           - Resume previous diet.                           - Continue present medications.                           - No repeat colonoscopy due to age. Procedure Code(s):        --- Professional ---                           [phone], 44, Colonoscopy, flexible; diagnostic,                            including collection of specimen(s) by brushing or                            washing, when performed (separate procedure) Diagnosis Code(s):         --- Professional ---                           K56.69, Other intestinal obstruction                           K57.30, Diverticulosis of large intestine without                            perforation or abscess without bleeding                           R93.3, Abnormal findings on diagnostic imaging of                            other parts of digestive tract CPT copyright 2016 American Medical Association. All rights reserved. The codes documented in this report are preliminary and upon coder review may  be revised to meet current compliance requirements. Missy Sabins, MD 11/20/2015 10:36:37 AM This report has been signed electronically. Number of Addenda: 0

## 2015-11-20 NOTE — Anesthesia Preprocedure Evaluation (Signed)
Anesthesia Evaluation  Patient identified by MRN, date of birth, ID band Patient awake    Reviewed: Allergy & Precautions, NPO status , Patient's Chart, lab work & pertinent test results  Airway Mallampati: II  TM Distance: >3 FB Neck ROM: Full    Dental  (+) Edentulous Upper, Edentulous Lower   Pulmonary former smoker,    breath sounds clear to auscultation       Cardiovascular  Rhythm:Irregular Rate:Normal     Neuro/Psych    GI/Hepatic   Endo/Other    Renal/GU      Musculoskeletal   Abdominal   Peds  Hematology   Anesthesia Other Findings   Reproductive/Obstetrics                             Anesthesia Physical Anesthesia Plan  ASA: III  Anesthesia Plan: MAC   Post-op Pain Management:    Induction: Intravenous  Airway Management Planned: Natural Airway and Simple Face Mask  Additional Equipment:   Intra-op Plan:   Post-operative Plan:   Informed Consent: I have reviewed the patients History and Physical, chart, labs and discussed the procedure including the risks, benefits and alternatives for the proposed anesthesia with the patient or authorized representative who has indicated his/her understanding and acceptance.     Plan Discussed with: CRNA and Anesthesiologist  Anesthesia Plan Comments:         Anesthesia Quick Evaluation

## 2015-11-20 NOTE — Anesthesia Postprocedure Evaluation (Signed)
Anesthesia Post Note  Patient: Dawn [name]  Procedure(s) Performed: Procedure(s) (LRB): FLEXIBLE SIGMOIDOSCOPY (N/A)  Patient location during evaluation: Endoscopy Anesthesia Type: MAC Level of consciousness: awake, awake and alert and oriented Pain management: pain level controlled Vital Signs Assessment: post-procedure vital signs reviewed and stable Respiratory status: spontaneous breathing, nonlabored ventilation, respiratory function stable and patient connected to nasal cannula oxygen Cardiovascular status: blood pressure returned to baseline Anesthetic complications: no    Last Vitals:  Vitals:   11/20/15 1050 11/20/15 1157  BP: (!) 108/50 114/64  Pulse: 76 71  Resp: (!) 26   Temp:      Last Pain:  Vitals:   11/20/15 1035  TempSrc: Oral  PainSc:                  [name]

## 2015-11-20 NOTE — Progress Notes (Signed)
Second attempt at sigmoidoscopy, colonoscopy today, not limited by prep but very sharp angulation, narrowing with a few scattered diverticuli between 25 and 30 cm. I could not traverse this area with either the pediatric or ultraslim colonoscope. Overall I suspect some sort of extrinsic process with no mucosal abnormalities seen to the point of furthest inspection. However a diverticular abscess or neoplasm with perforation cannot be ruled out. I do not think this can be ascertained any further endoscopically. Would recommend re-involving interventional radiology to see if there is any further study to better illuminate the situation, if nothing interventional, perhaps a Gastrografin enema. Since she does not appear clinically obstructed no clear need for surgical intervention but if no other etiology for her ongoing fevers, surgical consult could be appropriate if IR drainage not opted for or felt safe or indicated.Marland Kitchen

## 2015-11-20 NOTE — Anesthesia Procedure Notes (Signed)
Procedure Name: MAC Date/Time: 11/20/2015 10:08 AM Performed by: Lance Coon Pre-anesthesia Checklist: Patient identified, Emergency Drugs available, Suction available, Patient being monitored and Timeout performed Patient Re-evaluated:Patient Re-evaluated prior to inductionOxygen Delivery Method: Nasal cannula Intubation Type: IV induction

## 2015-11-21 LAB — CULTURE, BLOOD (ROUTINE X 2)
Culture: NO GROWTH
Culture: NO GROWTH

## 2015-11-23 ENCOUNTER — Encounter: Payer: Self-pay | Admitting: Internal Medicine

## 2015-11-23 ENCOUNTER — Ambulatory Visit: Payer: Medicare Other | Admitting: Endocrinology

## 2015-11-23 ENCOUNTER — Non-Acute Institutional Stay (SKILLED_NURSING_FACILITY): Payer: Medicare Other | Admitting: Internal Medicine

## 2015-11-23 DIAGNOSIS — Z0289 Encounter for other administrative examinations: Secondary | ICD-10-CM

## 2015-11-23 DIAGNOSIS — D649 Anemia, unspecified: Secondary | ICD-10-CM | POA: Diagnosis not present

## 2015-11-23 DIAGNOSIS — I471 Supraventricular tachycardia: Secondary | ICD-10-CM

## 2015-11-23 DIAGNOSIS — G9340 Encephalopathy, unspecified: Secondary | ICD-10-CM | POA: Diagnosis not present

## 2015-11-23 DIAGNOSIS — I9589 Other hypotension: Secondary | ICD-10-CM

## 2015-11-23 DIAGNOSIS — A419 Sepsis, unspecified organism: Secondary | ICD-10-CM | POA: Diagnosis not present

## 2015-11-23 DIAGNOSIS — F329 Major depressive disorder, single episode, unspecified: Secondary | ICD-10-CM

## 2015-11-23 DIAGNOSIS — F32A Depression, unspecified: Secondary | ICD-10-CM

## 2015-11-23 DIAGNOSIS — N179 Acute kidney failure, unspecified: Secondary | ICD-10-CM | POA: Diagnosis not present

## 2015-11-23 DIAGNOSIS — K591 Functional diarrhea: Secondary | ICD-10-CM | POA: Diagnosis not present

## 2015-11-23 DIAGNOSIS — G629 Polyneuropathy, unspecified: Secondary | ICD-10-CM

## 2015-11-23 DIAGNOSIS — E0590 Thyrotoxicosis, unspecified without thyrotoxic crisis or storm: Secondary | ICD-10-CM

## 2015-11-23 NOTE — Progress Notes (Signed)
: Provider:  Noah Delaine. Sheppard Coil, MD Location:  [facility] Room Number: 983J Place of Service:  SNF (31)  PCP: [name], MD Patient Care Team: Lin Landsman, MD as PCP - General (Family Medicine)  Extended Emergency Contact Information Primary Emergency Contact: Hatfield,Lisa Address: [address]          [place], [place] [address] [place] Work Phone: [phone] Mobile Phone: [phone] Relation: Daughter     Allergies: Patient has no known allergies.  Chief Complaint  Patient presents with  . New Admit To SNF    Admit to Facility    HPI: Patient is 75 y.o. female wITH anxiety and depression, recently treated in the outpatient setting for a UTI with Macrobid, admitted for evaluation of acute encephalopathy. Per family, patient has been lethargic, with no significant oral intake for at least 2 weeks prior to this admission. She is also had some intermittent nausea,vomiting and diarrhea. She was found to be febrile, with low blood pressures. Pt was admitted to [facility] from 10/31-11/10 where she was treated with IVF and vancomycin and rocephin until stool cx and blood cx came back neg. Pt's hospital course was complicated by encephalopathy, improved and a recurrent fever, which w/u was neg except for a possible ovarian cyst. Hospital course was further complicated by AKI, improved with IVF, the development of SVT, rate controlled with course of dig and with metoprolol  and new dx of hyperthyroidism, tx with methimazole and metoprolol. Pt is admitted to SNF for generalized weakness for OT/PT. While at SNF pt will be followed for depression, tx with welbutrin, neuropathy, tx with neurontin and chronic anemia, tx with iron.  Past Medical History:  Diagnosis Date  . COPD (chronic obstructive pulmonary disease) ([place])   . History of anxiety   . History of depression   . Spinal stenosis   . UTI (urinary tract infection)     Past Surgical  History:  Procedure Laterality Date  . FLEXIBLE SIGMOIDOSCOPY Left 11/18/2015   Procedure: FLEXIBLE SIGMOIDOSCOPY;  Surgeon: [name], MD;  Location: [facility];  Service: Endoscopy;  Laterality: Left;  . NO PAST SURGERIES        Medication List       Accurate as of 11/23/15  9:25 AM. Always use your most recent med list.          ALPRAZolam 1 MG tablet Commonly known as:  XANAX Take 1 mg by mouth 3 (three) times daily as needed for anxiety.   buPROPion 150 MG 24 hr tablet Commonly known as:  WELLBUTRIN XL Take 150 mg by mouth daily.   FERROCITE 324 (106 Fe) MG Tabs tablet Generic drug:  Ferrous Fumarate Take 324 mg by mouth daily.   gabapentin 600 MG tablet Commonly known as:  NEURONTIN Take 600 mg by mouth 3 (three) times daily.   INCRUSE ELLIPTA 62.5 MCG/INH Aepb Generic drug:  umeclidinium bromide Inhale 1 puff into the lungs daily.   methimazole 10 MG tablet Commonly known as:  TAPAZOLE Take 1 tablet (10 mg total) by mouth 2 (two) times daily.   metoprolol tartrate 25 MG tablet Commonly known as:  LOPRESSOR Take 1 tablet (25 mg total) by mouth 2 (two) times daily.   Oxycodone HCl 10 MG Tabs Take 10 mg by mouth every 8 (eight) hours.   traZODone 100 MG tablet Commonly known as:  DESYREL Take 100 mg by mouth at bedtime.       No  orders of the defined types were placed in this encounter.    There is no immunization history on file for this patient.  Social History  Substance Use Topics  . Smoking status: Former Research scientist (life sciences)  . Smokeless tobacco: Never Used  . Alcohol use No    Family history is   Family History  Problem Relation Age of Onset  . Heart attack Mother   . Diabetes Father       Review of Systems  DATA OBTAINED: from patient, nurse GENERAL:  no fevers, fatigue, appetite changes SKIN: No itching, or rash EYES: No eye pain, redness, discharge EARS: No earache, tinnitus, change in hearing NOSE: No congestion, drainage or  bleeding  MOUTH/THROAT: No mouth or tooth pain, No sore throat RESPIRATORY: No cough, wheezing, SOB CARDIAC: No chest pain, palpitations, lower extremity edema  GI: No abdominal pain, No N/V/D or constipation, No heartburn or reflux  GU: No dysuria, frequency or urgency, or incontinence  MUSCULOSKELETAL: No unrelieved bone/joint pain NEUROLOGIC: No headache, dizziness or focal weakness PSYCHIATRIC: No c/o anxiety or sadness   Vitals:   11/23/15 0839  BP: 127/84  Pulse: 90  Resp: 20  Temp: 98.2 F (36.8 C)    SpO2 Readings from Last 1 Encounters:  11/23/15 97%   Body mass index is 21.24 kg/m.     Physical Exam  GENERAL APPEARANCE: Alert, conversant,  No acute distress.  SKIN: No diaphoresis rash HEAD: Normocephalic, atraumatic  EYES: Conjunctiva/lids clear. Pupils round, reactive. EOMs intact.  EARS: External exam WNL, canals clear. Hearing grossly normal.  NOSE: No deformity or discharge.  MOUTH/THROAT: Lips w/o lesions  RESPIRATORY: Breathing is even, unlabored. Lung sounds are clear   CARDIOVASCULAR: Heart RRR no murmurs, rubs or gallops. No peripheral edema.   GASTROINTESTINAL: Abdomen is soft, non-tender, not distended w/ normal bowel sounds. GENITOURINARY: Bladder non tender, not distended  MUSCULOSKELETAL: No abnormal joints or musculature NEUROLOGIC:  Cranial nerves 2-12 grossly intact. Moves all extremities  PSYCHIATRIC: Mood and affect appropriate to situation, no behavioral issues  Patient Active Problem List   Diagnosis Date Noted  . Renal insufficiency   . Protein-calorie malnutrition, severe 11/18/2015  . Intra-abdominal fluid collection   . Premature atrial contractions   . Functional diarrhea   . Sepsis ([place])   . Hyperthyroidism   . Hypotension   . Septic shock ([place])   . Acute encephalopathy   . Hypokalemia   . Acute delirium 11/10/2015  . Acute kidney injury ([place]) 11/10/2015  . Normocytic anemia 11/10/2015  . Anxiety and depression  11/10/2015  . Chronic pain syndrome 11/10/2015  . Recurrent UTI 11/10/2015  . Acute respiratory failure with hypoxia ([place]) 11/10/2015  . COPD (chronic obstructive pulmonary disease) ([place]) 11/10/2015  . Acute hyperglycemia 11/10/2015  . CKD (chronic kidney disease) stage 3, GFR 30-59 ml/min 11/10/2015  . Narrow complex tachycardia ([place]) 11/10/2015  . HTN (hypertension) 11/10/2015  . Generalized weakness 11/10/2015  . Anemia   . Delirium   . Hypoxemia   . Tachycardia       Labs reviewed: Basic Metabolic Panel:    Component Value Date/Time   NA 137 11/20/2015 0558   NA 137 11/20/2015   K 4.5 11/20/2015 0558   CL 98 (L) 11/20/2015 0558   CO2 32 11/20/2015 0558   GLUCOSE 88 11/20/2015 0558   BUN 11 11/20/2015 0558   BUN 11 11/20/2015   CREATININE 1.08 (H) 11/20/2015 0558   CALCIUM 8.0 (L) 11/20/2015 0558   PROT 4.6 (L)  11/11/2015 0734   ALBUMIN 1.6 (L) 11/14/2015 0556   AST 12 (L) 11/11/2015 0734   ALT 8 (L) 11/11/2015 0734   ALKPHOS 52 11/11/2015 0734   BILITOT 0.4 11/11/2015 0734   GFRNONAA 49 (L) 11/20/2015 0558   GFRAA 56 (L) 11/20/2015 0558     Recent Labs  11/12/15 0455 11/13/15 0129  11/14/15 0556  11/18/15 0600 11/19/15 11/19/15 0428 11/20/15 11/20/15 0558  NA 139 139  < > 139  < > 135 138 138 137 137  K 4.2 3.6  < > 3.0*  < > 4.6 4.7 4.7  --  4.5  CL 108 106  --  109  < > 97*  --  97*  --  98*  CO2 25 25  --  24  < > 29  --  35*  --  32  GLUCOSE 92 125*  --  86  < > 107*  --  88  --  88  BUN 10 13  < > 11  < > '17 14 14 11 11  '$ CREATININE 0.93 1.10*  < > 0.90  < > 1.39* 1.2* 1.19* 1.1 1.08*  CALCIUM 7.6* 7.3*  --  7.3*  < > 8.3*  --  8.1*  --  8.0*  MG 1.5* 2.4  --  1.9  --   --   --  2.4  --   --   PHOS 1.9* 3.7  --  3.2  --   --   --   --   --   --   < > = values in this interval not displayed. Liver Function Tests:  Recent Labs  11/10/15  11/10/15 0220 11/11/15 0734 11/12/15 0455 11/13/15 0129 11/14/15 0556  AST 12*  --  12* 12*  --   --    --   ALT 9  --  9* 8*  --   --   --   ALKPHOS 68  --  68 52  --   --   --   BILITOT  --   --  0.6 0.4  --   --   --   PROT  --   --  6.4* 4.6*  --   --   --   ALBUMIN  --   < > 2.5* 1.7* 1.8* 1.7* 1.6*  < > = values in this interval not displayed. No results for input(s): LIPASE, AMYLASE in the last 8760 hours. No results for input(s): AMMONIA in the last 8760 hours. CBC:  Recent Labs  11/14/15 0556  11/18/15 0600 11/19/15 11/19/15 0428 11/20/15 11/20/15 0558  WBC 4.3  < > 5.3 4.8 4.8 2.8 2.8*  NEUTROABS 2.7  --   --   --  3.3  --  1.5*  HGB 7.3*  < > 8.2* 8.6* 8.6*  --  7.7*  HCT 23.4*  < > 26.6* 29* 29.0*  --  24.9*  MCV 91.4  < > 93.3  --  94.8  --  94.0  PLT 178  < > 239 225 225  --  188  < > = values in this interval not displayed. Lipid No results for input(s): CHOL, HDL, LDLCALC, TRIG in the last 8760 hours.  Cardiac Enzymes:  Recent Labs  11/11/15 1805 11/11/15 2245 11/12/15 0455  TROPONINI <0.03 <0.03 <0.03   BNP: No results for input(s): BNP in the last 8760 hours. No results found for: [facility] Lab Results  Component Value Date   HGBA1C 5.5 11/10/2015  Lab Results  Component Value Date   TSH 0.289 (L) 11/10/2015   Lab Results  Component Value Date   VITAMINB12 1,511 (H) 11/10/2015   Lab Results  Component Value Date   FOLATE 21.9 11/10/2015   Lab Results  Component Value Date   IRON 9 (L) 11/10/2015   TIBC 153 (L) 11/10/2015   FERRITIN 151 11/10/2015    Imaging and Procedures obtained prior to SNF admission: Dg Chest 2 View  Result Date: 11/10/2015 CLINICAL DATA:  Hypoxemia EXAM: CHEST  2 VIEW COMPARISON:  03/19/2009 FINDINGS: Cardiac shadow remains enlarged. Aortic calcifications are noted. The lungs demonstrate scattered interstitial changes without focal infiltrate. Some scattered scarring is noted in the left lung base stable from the prior exam. No acute infiltrate or sizable effusion is noted. Old rib fractures on the right and old  right clavicular fracture are noted. IMPRESSION: Chronic changes without acute abnormality. Electronically Signed   By: Inez Catalina M.D.   On: 11/10/2015 14:31   Ct Head Wo Contrast  Result Date: 11/10/2015 CLINICAL DATA:  Increasing confusion EXAM: CT HEAD WITHOUT CONTRAST TECHNIQUE: Contiguous axial images were obtained from the base of the skull through the vertex without intravenous contrast. COMPARISON:  None. FINDINGS: Brain: There is no intracranial hemorrhage, mass or evidence of acute infarction. There is mild generalized atrophy. There is mild chronic microvascular ischemic change. There is no significant extra-axial fluid collection. No acute intracranial findings are evident. The calvarium and skullbase are intact. Visible paranasal sinuses and orbits are unremarkable. Vascular: No hyperdense vessel or unexpected calcification. Skull: Normal. Negative for fracture or focal lesion. Sinuses/Orbits: Left maxillary sinus air-fluid level IMPRESSION: No acute intracranial findings. There is moderate generalized atrophy and chronic appearing white matter hypodensities which likely represent small vessel ischemic disease. Electronically Signed   By: Andreas Newport M.D.   On: 11/10/2015 04:05     Not all labs, radiology exams or other studies done during hospitalization come through on my EPIC note; however they are reviewed by me.    Assessment and Plan  SEPSIS/ FEVER/ HYPOTENSION/ AKI/ ENCEPHALOPATHY - sepsis thought to be 2/2 UTI or diarrhea and pt was tx with IVF and vancomycin and rocephin until cultures came back negative; pt was a afebrile for a while then fever recurred which precipitated a CT scan of chest and abdomen, which revealed a fluid collection , which after a negative sigmoidoscopy and further reflction was felt to be an ovarian cysts that needs to be f/u with an outpt U/S  SVT - felt 2/2 acute illness and hyperthyroidism EF 45-50%; rate controlled with dig and when BP  improved, that was d/c and metoprolol continued. SNF - cont metoprolol 25 mg BID  HYPERTHYROIDISM/ DIARRHEA - ONE WAS PROBABLY 2/2 TO THE OTHER;stool cx neg and neg for C diff; hyperthyroidism was tx with methimazole and metoprolol SNF - cont methimazole 10 mg BID and metoprolol 25 mg BID;oupt f/u with endocrinology  DEPRESSION SNF - cont welbutrin 150 mg daily  POLYNEUROPATHY SNF - controlled with neurontin 600 mg TID  ANEMIA - pt received 1 u PRBC; no d/c Hb; Hb before tx was 7.7 SNF - f/u with CBC; cont iron once daily    Time spent . 45 min;> 50% of time with patient was spent reviewing records, labs, tests and studies, counseling and developing plan of care  Webb Silversmith D. Sheppard Coil, MD

## 2015-11-26 ENCOUNTER — Ambulatory Visit: Payer: Medicare Other | Admitting: Endocrinology

## 2015-11-27 ENCOUNTER — Encounter: Payer: Self-pay | Admitting: Internal Medicine

## 2015-11-27 DIAGNOSIS — G629 Polyneuropathy, unspecified: Secondary | ICD-10-CM | POA: Insufficient documentation

## 2015-11-27 DIAGNOSIS — I471 Supraventricular tachycardia, unspecified: Secondary | ICD-10-CM | POA: Insufficient documentation

## 2015-11-27 DIAGNOSIS — F339 Major depressive disorder, recurrent, unspecified: Secondary | ICD-10-CM | POA: Insufficient documentation

## 2015-11-27 DIAGNOSIS — R197 Diarrhea, unspecified: Secondary | ICD-10-CM | POA: Insufficient documentation

## 2015-11-30 ENCOUNTER — Emergency Department (HOSPITAL_COMMUNITY): Payer: Medicare Other

## 2015-11-30 ENCOUNTER — Inpatient Hospital Stay (HOSPITAL_COMMUNITY)
Admission: EM | Admit: 2015-11-30 | Discharge: 2015-12-04 | DRG: 091 | Disposition: A | Discharge status: Skilled Nursing Facility | Payer: Medicare Other | Attending: Internal Medicine | Admitting: Internal Medicine

## 2015-11-30 ENCOUNTER — Encounter (HOSPITAL_COMMUNITY): Payer: Self-pay | Admitting: *Deleted

## 2015-11-30 DIAGNOSIS — T402X5A Adverse effect of other opioids, initial encounter: Secondary | ICD-10-CM | POA: Diagnosis present

## 2015-11-30 DIAGNOSIS — M4800 Spinal stenosis, site unspecified: Secondary | ICD-10-CM | POA: Diagnosis present

## 2015-11-30 DIAGNOSIS — T426X5A Adverse effect of other antiepileptic and sedative-hypnotic drugs, initial encounter: Secondary | ICD-10-CM | POA: Diagnosis present

## 2015-11-30 DIAGNOSIS — Z79891 Long term (current) use of opiate analgesic: Secondary | ICD-10-CM

## 2015-11-30 DIAGNOSIS — Z681 Body mass index (BMI) 19 or less, adult: Secondary | ICD-10-CM

## 2015-11-30 DIAGNOSIS — G92 Toxic encephalopathy: Principal | ICD-10-CM | POA: Diagnosis present

## 2015-11-30 DIAGNOSIS — Y92129 Unspecified place in nursing home as the place of occurrence of the external cause: Secondary | ICD-10-CM

## 2015-11-30 DIAGNOSIS — D509 Iron deficiency anemia, unspecified: Secondary | ICD-10-CM | POA: Diagnosis present

## 2015-11-30 DIAGNOSIS — E0590 Thyrotoxicosis, unspecified without thyrotoxic crisis or storm: Secondary | ICD-10-CM | POA: Diagnosis present

## 2015-11-30 DIAGNOSIS — F329 Major depressive disorder, single episode, unspecified: Secondary | ICD-10-CM | POA: Diagnosis present

## 2015-11-30 DIAGNOSIS — N183 Chronic kidney disease, stage 3 unspecified: Secondary | ICD-10-CM | POA: Diagnosis present

## 2015-11-30 DIAGNOSIS — Z833 Family history of diabetes mellitus: Secondary | ICD-10-CM

## 2015-11-30 DIAGNOSIS — G9340 Encephalopathy, unspecified: Secondary | ICD-10-CM | POA: Diagnosis present

## 2015-11-30 DIAGNOSIS — R471 Dysarthria and anarthria: Secondary | ICD-10-CM | POA: Diagnosis present

## 2015-11-30 DIAGNOSIS — R52 Pain, unspecified: Secondary | ICD-10-CM

## 2015-11-30 DIAGNOSIS — R404 Transient alteration of awareness: Secondary | ICD-10-CM | POA: Insufficient documentation

## 2015-11-30 DIAGNOSIS — E43 Unspecified severe protein-calorie malnutrition: Secondary | ICD-10-CM | POA: Diagnosis present

## 2015-11-30 DIAGNOSIS — T424X5A Adverse effect of benzodiazepines, initial encounter: Secondary | ICD-10-CM | POA: Diagnosis present

## 2015-11-30 DIAGNOSIS — R9401 Abnormal electroencephalogram [EEG]: Secondary | ICD-10-CM | POA: Diagnosis present

## 2015-11-30 DIAGNOSIS — R531 Weakness: Secondary | ICD-10-CM

## 2015-11-30 DIAGNOSIS — F419 Anxiety disorder, unspecified: Secondary | ICD-10-CM | POA: Diagnosis present

## 2015-11-30 DIAGNOSIS — F339 Major depressive disorder, recurrent, unspecified: Secondary | ICD-10-CM | POA: Diagnosis present

## 2015-11-30 DIAGNOSIS — J449 Chronic obstructive pulmonary disease, unspecified: Secondary | ICD-10-CM | POA: Diagnosis present

## 2015-11-30 DIAGNOSIS — I471 Supraventricular tachycardia: Secondary | ICD-10-CM | POA: Diagnosis present

## 2015-11-30 DIAGNOSIS — G894 Chronic pain syndrome: Secondary | ICD-10-CM | POA: Diagnosis present

## 2015-11-30 DIAGNOSIS — R4182 Altered mental status, unspecified: Secondary | ICD-10-CM

## 2015-11-30 DIAGNOSIS — Z87891 Personal history of nicotine dependence: Secondary | ICD-10-CM

## 2015-11-30 DIAGNOSIS — I10 Essential (primary) hypertension: Secondary | ICD-10-CM | POA: Diagnosis present

## 2015-11-30 DIAGNOSIS — R509 Fever, unspecified: Secondary | ICD-10-CM

## 2015-11-30 DIAGNOSIS — Z8249 Family history of ischemic heart disease and other diseases of the circulatory system: Secondary | ICD-10-CM

## 2015-11-30 LAB — CBC
HCT: 27.9 % — ABNORMAL LOW (ref 36.0–46.0)
Hemoglobin: 8.4 g/dL — ABNORMAL LOW (ref 12.0–15.0)
MCH: 28.5 pg (ref 26.0–34.0)
MCHC: 30.1 g/dL (ref 30.0–36.0)
MCV: 94.6 fL (ref 78.0–100.0)
Platelets: 227 K/uL (ref 150–400)
RBC: 2.95 MIL/uL — ABNORMAL LOW (ref 3.87–5.11)
RDW: 17.9 % — ABNORMAL HIGH (ref 11.5–15.5)
WBC: 6.7 K/uL (ref 4.0–10.5)

## 2015-11-30 LAB — BASIC METABOLIC PANEL
BUN: 16 mg/dL (ref 4–21)
BUN: 19 mg/dL (ref 4–21)
CREATININE: 1.2 mg/dL — AB (ref 0.5–1.1)
CREATININE: 1.2 mg/dL — AB (ref 0.5–1.1)
GLUCOSE: 91 mg/dL
GLUCOSE: 92 mg/dL
POTASSIUM: 4.7 mmol/L (ref 3.4–5.3)
POTASSIUM: 4.7 mmol/L (ref 3.4–5.3)
SODIUM: 136 mmol/L — AB (ref 137–147)
Sodium: 135 mmol/L — AB (ref 137–147)

## 2015-11-30 LAB — I-STAT CHEM 8, ED
BUN: 19 mg/dL (ref 6–20)
CHLORIDE: 98 mmol/L — AB (ref 101–111)
CREATININE: 1.2 mg/dL — AB (ref 0.44–1.00)
Calcium, Ion: 1.09 mmol/L — ABNORMAL LOW (ref 1.15–1.40)
Glucose, Bld: 91 mg/dL (ref 65–99)
HEMATOCRIT: 27 % — AB (ref 36.0–46.0)
Hemoglobin: 9.2 g/dL — ABNORMAL LOW (ref 12.0–15.0)
POTASSIUM: 4.7 mmol/L (ref 3.5–5.1)
Sodium: 136 mmol/L (ref 135–145)
TCO2: 29 mmol/L (ref 0–100)

## 2015-11-30 LAB — COMPREHENSIVE METABOLIC PANEL WITH GFR
ALT: 9 U/L — ABNORMAL LOW (ref 14–54)
AST: 11 U/L — ABNORMAL LOW (ref 15–41)
Albumin: 2.6 g/dL — ABNORMAL LOW (ref 3.5–5.0)
Alkaline Phosphatase: 68 U/L (ref 38–126)
Anion gap: 6 (ref 5–15)
BUN: 16 mg/dL (ref 6–20)
CO2: 28 mmol/L (ref 22–32)
Calcium: 8.3 mg/dL — ABNORMAL LOW (ref 8.9–10.3)
Chloride: 101 mmol/L (ref 101–111)
Creatinine, Ser: 1.16 mg/dL — ABNORMAL HIGH (ref 0.44–1.00)
GFR calc Af Amer: 52 mL/min — ABNORMAL LOW
GFR calc non Af Amer: 45 mL/min — ABNORMAL LOW
Glucose, Bld: 92 mg/dL (ref 65–99)
Potassium: 4.7 mmol/L (ref 3.5–5.1)
Sodium: 135 mmol/L (ref 135–145)
Total Bilirubin: 0.3 mg/dL (ref 0.3–1.2)
Total Protein: 5.7 g/dL — ABNORMAL LOW (ref 6.5–8.1)

## 2015-11-30 LAB — CBC AND DIFFERENTIAL
HCT: 28 % — AB (ref 36–46)
Hemoglobin: 8.4 g/dL — AB (ref 12.0–16.0)
PLATELETS: 227 10*3/uL (ref 150–399)
WBC: 6.7 10*3/mL

## 2015-11-30 LAB — DIFFERENTIAL
Basophils Absolute: 0 K/uL (ref 0.0–0.1)
Basophils Relative: 0 %
Eosinophils Absolute: 0 K/uL (ref 0.0–0.7)
Eosinophils Relative: 1 %
Lymphocytes Relative: 21 %
Lymphs Abs: 1.4 K/uL (ref 0.7–4.0)
Monocytes Absolute: 0.7 K/uL (ref 0.1–1.0)
Monocytes Relative: 11 %
Neutro Abs: 4.5 K/uL (ref 1.7–7.7)
Neutrophils Relative %: 67 %

## 2015-11-30 LAB — I-STAT ARTERIAL BLOOD GAS, ED
Acid-base deficit: 1 mmol/L (ref 0.0–2.0)
Bicarbonate: 24.9 mmol/L (ref 20.0–28.0)
O2 Saturation: 84 %
Patient temperature: 98.6
TCO2: 26 mmol/L (ref 0–100)
pCO2 arterial: 43.4 mmHg (ref 32.0–48.0)
pH, Arterial: 7.366 (ref 7.350–7.450)
pO2, Arterial: 51 mmHg — ABNORMAL LOW (ref 83.0–108.0)

## 2015-11-30 LAB — HEPATIC FUNCTION PANEL
ALT: 9 U/L (ref 7–35)
AST: 11 U/L — AB (ref 13–35)
Alkaline Phosphatase: 68 U/L (ref 25–125)
Bilirubin, Total: 0.3 mg/dL

## 2015-11-30 LAB — PROTIME-INR
INR: 1.23
PROTHROMBIN TIME: 15.5 s — AB (ref 11.4–15.2)

## 2015-11-30 LAB — I-STAT TROPONIN, ED: TROPONIN I, POC: 0.02 ng/mL (ref 0.00–0.08)

## 2015-11-30 LAB — CBG MONITORING, ED: GLUCOSE-CAPILLARY: 79 mg/dL (ref 65–99)

## 2015-11-30 LAB — ETHANOL: Alcohol, Ethyl (B): <5 mg/dL

## 2015-11-30 LAB — APTT: aPTT: 28 s (ref 24–36)

## 2015-11-30 MED ORDER — NALOXONE HCL 0.4 MG/ML IJ SOLN
INTRAMUSCULAR | Status: AC
  Filled 2015-11-30: qty 1

## 2015-11-30 MED ORDER — NALOXONE HCL 0.4 MG/ML IJ SOLN
0.4000 mg | Freq: Once | INTRAMUSCULAR | Status: AC
  Administered 2015-11-30: 0.4 mg via INTRAVENOUS

## 2015-11-30 NOTE — Progress Notes (Addendum)
Patient arrived to unit via ED staff, no family at bedside, vitals taken and stable. Oriented to unit/room as able. Continue to monitor. MD notified that pt arrived to floor

## 2015-11-30 NOTE — ED Notes (Signed)
Patient transported to MRI 

## 2015-11-30 NOTE — ED Triage Notes (Signed)
Patient arrived via EMS from [facility].  Daughter found her face down on the floor in the BR at the rehab facility.  Patient arrived as a code stroke and seen by Dr Leonel Ramsay

## 2015-11-30 NOTE — ED Notes (Signed)
Patient sleeping at this time.

## 2015-11-30 NOTE — ED Notes (Signed)
Patient transported to CT 

## 2015-11-30 NOTE — ED Triage Notes (Signed)
Patient presents today found on the floor unresponsive at 1745.Patient [place] at 1500 Patient left-side facial droop and weakness per EMS. Patient also only responsive to painful stimuli  Patient given 0.4 Narcan. Patient responsive on arrival. Moving all extremities. Patient complains of pain right hip pain and back pain.

## 2015-11-30 NOTE — ED Provider Notes (Signed)
[facility] DEPT Provider Note  CSN: [id number] Arrival date & time: 11/30/15  1847    History   Chief Complaint Chief Complaint  Patient presents with  . Code Stroke  . Fall    HPI Dawn [name] is a 75 y.o. female.  HPI Patient presents to the emergency room for evaluation of altered mental status and a fall.  She is a resident at a nursing facility.  The last time she was seen normal was at 3pm.   She was found lying on the floor.   When EMS arrived the patient was confused. Her speech was slurred. Code stroke was activated. She was given Narcan on route with some improvement in her mental status.  Here in ED the patient is alert but speech is mildly dysarthric. She moves all extremities. She is complaining some pain in her left hip. Past Medical History:  Diagnosis Date  . COPD (chronic obstructive pulmonary disease) ([place])   . History of anxiety   . History of depression   . Spinal stenosis   . UTI (urinary tract infection)     Patient Active Problem List   Diagnosis Date Noted  . SVT (supraventricular tachycardia) ([place]) 11/27/2015  . Diarrhea 11/27/2015  . Depression 11/27/2015  . Polyneuropathy ([place]) 11/27/2015  . Renal insufficiency   . Protein-calorie malnutrition, severe 11/18/2015  . Intra-abdominal fluid collection   . Premature atrial contractions   . Functional diarrhea   . Sepsis ([place])   . Hyperthyroidism   . Hypotension   . Septic shock ([place])   . Acute encephalopathy   . Hypokalemia   . Acute delirium 11/10/2015  . Acute kidney injury ([place]) 11/10/2015  . Normocytic anemia 11/10/2015  . Anxiety and depression 11/10/2015  . Chronic pain syndrome 11/10/2015  . Recurrent UTI 11/10/2015  . Acute respiratory failure with hypoxia ([place]) 11/10/2015  . COPD (chronic obstructive pulmonary disease) ([place]) 11/10/2015  . Acute hyperglycemia 11/10/2015  . CKD (chronic kidney disease) stage 3, GFR 30-59 ml/min 11/10/2015  . Narrow complex tachycardia  ([place]) 11/10/2015  . HTN (hypertension) 11/10/2015  . Generalized weakness 11/10/2015  . Anemia   . Delirium   . Hypoxemia   . Tachycardia     Past Surgical History:  Procedure Laterality Date  . FLEXIBLE SIGMOIDOSCOPY Left 11/18/2015   Procedure: FLEXIBLE SIGMOIDOSCOPY;  Surgeon: [name], MD;  Location: [facility];  Service: Endoscopy;  Laterality: Left;  . FLEXIBLE SIGMOIDOSCOPY N/A 11/20/2015   Procedure: FLEXIBLE SIGMOIDOSCOPY;  Surgeon: [name], MD;  Location: [facility] ENDOSCOPY;  Service: Endoscopy;  Laterality: N/A;  . NO PAST SURGERIES      OB History    No data available       Home Medications    Prior to Admission medications   Medication Sig Start Date End Date Taking? Authorizing Provider  ALPRAZolam Duanne Moron) 1 MG tablet Take 1 mg by mouth 3 (three) times daily as needed for anxiety.   Yes Historical Provider, MD  buPROPion (WELLBUTRIN XL) 150 MG 24 hr tablet Take 150 mg by mouth daily.   Yes Historical Provider, MD  FERROCITE 324 MG TABS tablet Take 324 mg by mouth daily. 11/02/15  Yes Historical Provider, MD  gabapentin (NEURONTIN) 600 MG tablet Take 600 mg by mouth 3 (three) times daily. 10/19/15  Yes Historical Provider, MD  INCRUSE ELLIPTA 62.5 MCG/INH AEPB Inhale 1 puff into the lungs daily. 10/19/15  Yes Historical Provider, MD  methimazole (TAPAZOLE) 10 MG tablet Take 1 tablet (10 mg  total) by mouth 2 (two) times daily. 11/20/15 12/20/15 Yes Jennifer Chahn-Yang Choi, DO  metoprolol tartrate (LOPRESSOR) 25 MG tablet Take 1 tablet (25 mg total) by mouth 2 (two) times daily. 11/20/15 12/20/15 Yes Jennifer Chahn-Yang Choi, DO  Oxycodone HCl 10 MG TABS Take 10 mg by mouth every 8 (eight) hours. 10/28/15  Yes Historical Provider, MD  traZODone (DESYREL) 100 MG tablet Take 100 mg by mouth at bedtime.   Yes Historical Provider, MD    Family History Family History  Problem Relation Age of Onset  . Heart attack Mother   . Diabetes Father     Social History Social  History  Substance Use Topics  . Smoking status: Former Research scientist (life sciences)  . Smokeless tobacco: Never Used  . Alcohol use No     Allergies   Patient has no known allergies.   Review of Systems Review of Systems  All other systems reviewed and are negative.    Physical Exam Updated Vital Signs BP (!) 99/50   Pulse 71   Temp 98.5 F (36.9 C) (Oral)   Resp 17   Ht '5\' 6"'$  (1.676 m)   Wt 54.9 kg   SpO2 100%   BMI 19.54 kg/m   Physical Exam  Constitutional: No distress.  Elderly, frail  HENT:  Head: Normocephalic and atraumatic.  Right Ear: External ear normal.  Left Ear: External ear normal.  Eyes: Conjunctivae are normal. Right eye exhibits no discharge. Left eye exhibits no discharge. No scleral icterus.  Neck: Neck supple. No tracheal deviation present.  Cardiovascular: Normal rate, regular rhythm and intact distal pulses.   Pulmonary/Chest: Effort normal and breath sounds normal. No stridor. No respiratory distress. She has no wheezes. She has no rales.  Abdominal: Soft. Bowel sounds are normal. She exhibits no distension. There is no tenderness. There is no rebound and no guarding.  Musculoskeletal: She exhibits no edema or tenderness.  No shortening of her lower extremities, no pain with flexion of her hips, no tenderness to palpation with AP compression of her pelvis  Neurological: She is alert. She has normal strength. She displays tremor. No cranial nerve deficit (no facial droop, extraocular movements intact, mildly slurred speech ) or sensory deficit. She exhibits normal muscle tone. She displays no seizure activity. Abnormal coordination: able to do finger to nose exam with some tremor,   Skin: Skin is warm and dry. No rash noted.  Psychiatric: She has a normal mood and affect.  Nursing note and vitals reviewed.    ED Treatments / Results  Labs (all labs ordered are listed, but only abnormal results are displayed) Labs Reviewed  PROTIME-INR - Abnormal; Notable for  the following:       Result Value   Prothrombin Time 15.5 (*)    All other components within normal limits  CBC - Abnormal; Notable for the following:    RBC 2.95 (*)    Hemoglobin 8.4 (*)    HCT 27.9 (*)    RDW 17.9 (*)    All other components within normal limits  COMPREHENSIVE METABOLIC PANEL - Abnormal; Notable for the following:    Creatinine, Ser 1.16 (*)    Calcium 8.3 (*)    Total Protein 5.7 (*)    Albumin 2.6 (*)    AST 11 (*)    ALT 9 (*)    GFR calc non Af Amer 45 (*)    GFR calc Af Amer 52 (*)    All other components within normal limits  I-STAT CHEM 8, ED - Abnormal; Notable for the following:    Chloride 98 (*)    Creatinine, Ser 1.20 (*)    Calcium, Ion 1.09 (*)    Hemoglobin 9.2 (*)    HCT 27.0 (*)    All other components within normal limits  I-STAT ARTERIAL BLOOD GAS, ED - Abnormal; Notable for the following:    pO2, Arterial 51.0 (*)    All other components within normal limits  ETHANOL  APTT  DIFFERENTIAL  RAPID URINE DRUG SCREEN, HOSP PERFORMED  URINALYSIS, ROUTINE W REFLEX MICROSCOPIC (NOT AT [facility])  I-STAT TROPOININ, ED  CBG MONITORING, ED    EKG  EKG Interpretation None       Radiology Dg Chest 1 View  Result Date: 11/30/2015 CLINICAL DATA:  Status post fall, with concern for chest injury. Initial encounter. EXAM: CHEST 1 VIEW COMPARISON:  Chest radiograph performed 11/10/2015, and CT of the chest performed 11/16/2015 FINDINGS: The lungs are hyperexpanded, with flattening of the hemidiaphragms, compatible with COPD. Mild bilateral atelectasis is seen. There is no evidence of pleural effusion or pneumothorax. The cardiomediastinal silhouette is enlarged. No acute osseous abnormalities are seen. Chronic right-sided rib deformities are noted. IMPRESSION: 1. No displaced rib fracture seen. 2. Findings of COPD, with mild bilateral atelectasis. 3. Cardiomegaly. Electronically Signed   By: Garald Balding M.D.   On: 11/30/2015 21:25   Dg Lumbar  Spine Complete  Result Date: 11/30/2015 CLINICAL DATA:  Status post fall, with lower back pain. Initial encounter. EXAM: LUMBAR SPINE - COMPLETE 4+ VIEW COMPARISON:  CT of the abdomen and pelvis performed 11/17/2015 FINDINGS: There is no evidence of fracture or subluxation. Vertebral bodies demonstrate normal height. Mild left convex thoracolumbar scoliosis is noted. Intervertebral disc spaces are preserved. Facet disease is noted along the lumbar spine. The visualized bowel gas pattern is unremarkable in appearance; air and stool are noted within the colon. The sacroiliac joints are within normal limits. Diffuse calcification is seen along the abdominal aorta acute IMPRESSION: 1. No evidence of fracture or subluxation along the lumbar spine. 2. Mild left convex thoracolumbar scoliosis, with mild degenerative change. 3. Diffuse aortic atherosclerosis. Electronically Signed   By: Garald Balding M.D.   On: 11/30/2015 21:23   Ct Cervical Spine Wo Contrast  Result Date: 11/30/2015 CLINICAL DATA:  Found unresponsive on floor at rehabilitation center. Weakness. History of spinal stenosis. EXAM: CT CERVICAL SPINE WITHOUT CONTRAST TECHNIQUE: Multidetector CT imaging of the cervical spine was performed without intravenous contrast. Multiplanar CT image reconstructions were also generated. COMPARISON:  None. FINDINGS: ALIGNMENT: Broad reversed lordosis. Minimal grade 1 C3-4 anterolisthesis off spondylolysis. SKULL BASE AND VERTEBRAE: Cervical vertebral bodies and posterior elements are intact. Severe C4-5, C5-6 disc height loss, moderate to severe at C3-4 and C6-7 associated with uncovertebral hypertrophy, subchondral cysts and endplate spurring compatible with degenerative discs. RIGHT C2-3 facets are fused on degenerative basis. Multilevel moderate to severe facet arthropathy. No destructive bony lesions. C1-2 articulation maintained. Calcified craniocervical ligaments. SOFT TISSUES AND SPINAL CANAL: Soft tissues  are nonacute. Superficial 23 x 8 mm probable sebaceous cyst C3-4 paraspinal soft tissues. Mild calcific atherosclerosis of the carotid bifurcations. DISC LEVELS: No significant osseous canal stenosis. Severe LEFT C3-4, moderate to severe bilateral C4-5, severe bilateral C5-6 and moderate to severe LEFT C6-7 neural foraminal narrowing. UPPER CHEST: Lung apices are clear. OTHER: None. IMPRESSION: No acute fracture. Minimal C3-4 anterolisthesis on degenerative basis. Multilevel severe and moderate to severe neural foraminal narrowing. Electronically Signed  By: Elon Alas M.D.   On: 11/30/2015 20:38   Dg Hips Bilat W Or Wo Pelvis 3-4 Views  Result Date: 11/30/2015 CLINICAL DATA:  Status post fall. Concern for pelvic injury. Initial encounter. EXAM: DG HIP (WITH OR WITHOUT PELVIS) 3-4V BILAT COMPARISON:  None. FINDINGS: There is no evidence of fracture or dislocation. Both femoral heads are seated normally within their respective acetabula. The proximal femurs appear intact bilaterally. No significant degenerative change is appreciated. The sacroiliac joints are unremarkable in appearance. The visualized bowel gas pattern is grossly unremarkable in appearance. IMPRESSION: No evidence of fracture or dislocation. Electronically Signed   By: Garald Balding M.D.   On: 11/30/2015 21:21   Ct Head Code Stroke W/o Cm  Result Date: 11/30/2015 CLINICAL DATA:  Code stroke. LEFT-sided weakness and slurred speech. Found on floor unresponsive . Last seen normal at 1500 hours. History of urinary tract infection. EXAM: CT HEAD WITHOUT CONTRAST TECHNIQUE: Contiguous axial images were obtained from the base of the skull through the vertex without intravenous contrast. COMPARISON:  CT HEAD November 10, 2015 FINDINGS: BRAIN: The ventricles and sulci are normal for age. No intraparenchymal hemorrhage, mass effect nor midline shift. Patchy supratentorial white matter hypodensities within normal range for patient's age,  though non-specific are most compatible with chronic small vessel ischemic disease. No acute large vascular territory infarcts. No abnormal extra-axial fluid collections. Basal cisterns are patent. VASCULAR: Mild to moderate calcific atherosclerosis of the carotid siphons. SKULL: No skull fracture. Moderate RIGHT temporomandibular osteoarthrosis. No significant scalp soft tissue swelling. SINUSES/ORBITS: Small LEFT maxillary sinus air-fluid level with frothy secretions, improved. Mild paranasal sinus mucosal thickening. Mastoid air cells are well aerated. Soft tissue within the external auditory canals compatible with cerumen. The included ocular globes and orbital contents are non-suspicious. 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 ASPECTS [facility] Stroke Program Early CT Score) - Ganglionic level infarction (caudate, lentiform nuclei, internal capsule, insula, M1-M3 cortex): 7 - Supraganglionic infarction (M4-M6 cortex): 3 Total score (0-10 with 10 being normal): 10 IMPRESSION: 1. No acute intracranial process ; negative CT HEAD for age. 2. ASPECTS is 10. Critical Value/emergent results were called by telephone at the time of interpretation on 11/30/2015 at 7:21 pm to Dr. Leonel Ramsay, Neurology, who verbally acknowledged these results. Electronically Signed   By: Elon Alas M.D.   On: 11/30/2015 19:22    Procedures Procedures (including critical care time)  Medications Ordered in ED Medications  naloxone (NARCAN) 0.4 MG/ML injection (not administered)  naloxone (NARCAN) injection 0.4 mg (0.4 mg Intravenous Given 11/30/15 1909)     Initial Impression / Assessment and Plan / ED Course  I have reviewed the triage vital signs and the nursing notes.  Pertinent labs & imaging results that were available during my care of the patient were reviewed by me and considered in my medical decision making (see chart for details).  Clinical Course as of Nov 30 2151  Mon Nov 30, 2015  1930 Anemia is stable Hemoglobin: (!) 8.4 [JK]  1930 Increased from previous Creatinine: (!) 1.20 [JK]  2140 X-rays without evidence of fracture or dislocation  [JK]    Clinical Course User Index [JK] Dorie Rank, MD   The patient presented to the emergency room with altered mental status. She was activated as a code stroke out in the field. She had some improvement with Narcan. She was evaluated by Dr. Leonel Ramsay in the emergency room. He does not feel that IV TPA is warranted. There is  a possibility that she's had an occult stroke but metabolic causes are more likely.  Plan on admission to the hospital for further evaluation. Hold sedating meds. MRI has been ordered.  Final Clinical Impressions(s) / ED Diagnoses   Final diagnoses:  Altered mental status, unspecified altered mental status type      Dorie Rank, MD 11/30/15 2155

## 2015-11-30 NOTE — Consult Note (Signed)
Neurology Consultation Reason for Consult: Altered mental status Referring Physician: Hillard Danker  CC: Altered mental status  History is obtained from:EMS  HPI: Dawn [name] is a 75 year old female who presents with altered mental status. She was last seen well at 3 PM, and subsequently was found down and brought into the emergency room as a code stroke. She was unresponsive with pinpoint pupils, and EMS felt that she had some questionable right-sided weakness, but he states that she was still unresponsive he is not certain. He gave her Narcan, and she rapidly improved following Narcan administration.  On arrival, she was grossly nonfocal, but did have some mild dysarthria and dysmetria bilaterally. She was given an additional dose of Narcan, but did not have any further definite improvement.   Of note, she was recently admitted with acute encephalopathy and found to have collection of fluid near the rectal sigmoid., She underwent flexible sigmoidoscopy with diverticuli. IR felt this is likely secondary to ovarian cyst and recommended a pelvic ultrasound.   She also was diagnosed with new onset hyperthyroidism.    LKW:  3PM tpa given?: no, Rapid improvement   ROS: A 14 point ROS was performed and is negative except as noted in the HPI.   Past Medical History:  Diagnosis Date  . COPD (chronic obstructive pulmonary disease) ([place])   . History of anxiety   . History of depression   . Spinal stenosis   . UTI (urinary tract infection)      Family History  Problem Relation Age of Onset  . Heart attack Mother   . Diabetes Father      Social History:  reports that she has quit smoking. She has never used smokeless tobacco. She reports that she does not drink alcohol. Her drug history is not on file.   Exam: Current vital signs: BP 131/63 (BP Location: Right Arm)   Pulse 73   Temp 98.5 F (36.9 C) (Oral)   Resp 23   Ht '5\' 6"'$  (1.676 m)   Wt 54.9 kg (121 lb 0.5 oz)   SpO2  100%   BMI 19.54 kg/m  Vital signs in last 24 hours: Temp:  [98.5 F (36.9 C)] 98.5 F (36.9 C) (11/20 1915) Pulse Rate:  [73-76] 73 (11/20 2000) Resp:  [23] 23 (11/20 2000) BP: (108-131)/(63-74) 131/63 (11/20 2000) SpO2:  [93 %-100 %] 100 % (11/20 2000) Weight:  [54.9 kg (121 lb 0.5 oz)-59.4 kg (130 lb 15.3 oz)] 54.9 kg (121 lb 0.5 oz) (11/20 2000)   Physical Exam  Constitutional: Appears well-developed and well-nourished.  Psych: Affect appropriate to situation Eyes: No scleral injection HENT: No OP obstrucion, C-collar in place.  Head: Normocephalic.  Cardiovascular: Normal rate and regular rhythm.  Respiratory: Effort normal and breath sounds normal to anterior ascultation GI: Soft.  No distension. There is no tenderness.  Skin: WDI  Neuro: Mental Status: Patient is drowsy, but easily rousable. oriented to person, place,Unable to give month. She is not sure why she is in the hospital. She is only moderately cooperative with exam.   I have question of dysarthria, but difficult to be certain because of c-collar.  Cranial Nerves: II: Visual Fields are full. Pupils are equal, round, and reactive to light.   III,IV, VI: EOMI without ptosis or diploplia.  V: Facial sensation is symmetric to pin VII: Facial movement is symmetric.  VIII: hearing is intact to voice X: Uvula elevates symmetrically XI: Shoulder shrug is symmetric. XII: tongue is midline without atrophy or  fasciculations.  Motor: Tone is normal. Bulk is normal. 5/5 strength was present in all four extremities.  Sensory: Sensation is symmetric to pin Cerebellar: She does have some difficulty with FNF bilaterally, but difficult to tell if due to cooperation, mental status, or true ataxia.    I have reviewed labs in epic and the results pertinent to this consultation are: Cr 1.2 Hgb 8.4(up from 7.7 10d ago)  I have reviewed the images obtained:CT head - no acute findings.   Impression: 75 yo F with AMS that  appears to have responded to narcan. She still is slightly confused, and I do think that an MRI is reasonable.   Given the improvement seen with Narcan, coupled with the lack of clear localizing symptoms, I did not feel that IV TPA was warranted. It is still possible that she may have had a small ischemic infarct, but especially at the end of the 4-1/2 are window with rapid improvement I don't think that further treatment is warranted.   Recommendations: 1) MRI brain, MRA head.  2) minimize sedating medications 3) UA 4) EEG 5) Neurology will continue to follow.    Roland Rack, MD [facility] [phone]  If 7pm- 7am, please page neurology on call as listed in [place].

## 2015-11-30 NOTE — ED Notes (Signed)
Patient returned from CT

## 2015-11-30 NOTE — Code Documentation (Addendum)
75 year old presents to [facility] as code stroke. Patient was found down in the bathroom after an unwitnessed fall at her SNF. She was LSW at 3 pm when the staff took her something to eat.   She has chronic pain and takes meds - EMS reports giving Narcan PTA with good results - patient more alert.  She is moving all 4 extremities - weak equally on all 4.  Speech slightly slurred but has c-collar on.  Dr. Leonel Ramsay at bedside.  CT done.  Becoming more sleepy - second dose of Narcan given - more alert and moving all 4's. BP 104/54 NS 500 cc bolus IV given.    Difficult neuro exam.  Out of window of tPA at 1930.  Handoff to [employer].  To call as needed.

## 2015-11-30 NOTE — Progress Notes (Signed)
Per ABG placed patient on 2 LPM nasal cannula. SPO2 94%.

## 2015-11-30 NOTE — ED Notes (Addendum)
Pt is CAOx1 with warm and dry skin. Initial NIH 6, 2nd NIH 5. Vitals prior to MRI 99/50, R-17, P-71. Pt is a resident at a nursing home and fell. Family advised EMS the patient was altered and patient was not her normal level of consciousness. EMS administered narcan with minimal improvement. CT Scan was negative and pt currently in MRI.

## 2015-12-01 ENCOUNTER — Observation Stay (HOSPITAL_COMMUNITY): Payer: Medicare Other

## 2015-12-01 ENCOUNTER — Encounter (HOSPITAL_COMMUNITY): Payer: Self-pay | Admitting: Internal Medicine

## 2015-12-01 ENCOUNTER — Observation Stay (HOSPITAL_BASED_OUTPATIENT_CLINIC_OR_DEPARTMENT_OTHER)
Admit: 2015-12-01 | Discharge: 2015-12-01 | Disposition: A | Discharge status: Home / Self Care | Payer: Medicare Other | Attending: Neurology | Admitting: Neurology

## 2015-12-01 DIAGNOSIS — R471 Dysarthria and anarthria: Secondary | ICD-10-CM | POA: Diagnosis present

## 2015-12-01 DIAGNOSIS — J449 Chronic obstructive pulmonary disease, unspecified: Secondary | ICD-10-CM | POA: Diagnosis present

## 2015-12-01 DIAGNOSIS — R404 Transient alteration of awareness: Secondary | ICD-10-CM | POA: Diagnosis present

## 2015-12-01 DIAGNOSIS — T424X5A Adverse effect of benzodiazepines, initial encounter: Secondary | ICD-10-CM | POA: Diagnosis present

## 2015-12-01 DIAGNOSIS — Z833 Family history of diabetes mellitus: Secondary | ICD-10-CM | POA: Diagnosis not present

## 2015-12-01 DIAGNOSIS — F329 Major depressive disorder, single episode, unspecified: Secondary | ICD-10-CM | POA: Diagnosis present

## 2015-12-01 DIAGNOSIS — N183 Chronic kidney disease, stage 3 (moderate): Secondary | ICD-10-CM | POA: Diagnosis not present

## 2015-12-01 DIAGNOSIS — G894 Chronic pain syndrome: Secondary | ICD-10-CM

## 2015-12-01 DIAGNOSIS — F419 Anxiety disorder, unspecified: Secondary | ICD-10-CM | POA: Diagnosis present

## 2015-12-01 DIAGNOSIS — R9401 Abnormal electroencephalogram [EEG]: Secondary | ICD-10-CM | POA: Diagnosis present

## 2015-12-01 DIAGNOSIS — I471 Supraventricular tachycardia: Secondary | ICD-10-CM | POA: Diagnosis present

## 2015-12-01 DIAGNOSIS — R4182 Altered mental status, unspecified: Secondary | ICD-10-CM

## 2015-12-01 DIAGNOSIS — Y92129 Unspecified place in nursing home as the place of occurrence of the external cause: Secondary | ICD-10-CM | POA: Diagnosis not present

## 2015-12-01 DIAGNOSIS — Z79891 Long term (current) use of opiate analgesic: Secondary | ICD-10-CM | POA: Diagnosis not present

## 2015-12-01 DIAGNOSIS — E0590 Thyrotoxicosis, unspecified without thyrotoxic crisis or storm: Secondary | ICD-10-CM | POA: Diagnosis present

## 2015-12-01 DIAGNOSIS — G92 Toxic encephalopathy: Secondary | ICD-10-CM | POA: Diagnosis present

## 2015-12-01 DIAGNOSIS — Z681 Body mass index (BMI) 19 or less, adult: Secondary | ICD-10-CM | POA: Diagnosis not present

## 2015-12-01 DIAGNOSIS — G9340 Encephalopathy, unspecified: Secondary | ICD-10-CM | POA: Diagnosis not present

## 2015-12-01 DIAGNOSIS — T426X5A Adverse effect of other antiepileptic and sedative-hypnotic drugs, initial encounter: Secondary | ICD-10-CM | POA: Diagnosis present

## 2015-12-01 DIAGNOSIS — E43 Unspecified severe protein-calorie malnutrition: Secondary | ICD-10-CM | POA: Diagnosis present

## 2015-12-01 DIAGNOSIS — T402X5A Adverse effect of other opioids, initial encounter: Secondary | ICD-10-CM | POA: Diagnosis present

## 2015-12-01 DIAGNOSIS — Z87891 Personal history of nicotine dependence: Secondary | ICD-10-CM | POA: Diagnosis not present

## 2015-12-01 DIAGNOSIS — M4800 Spinal stenosis, site unspecified: Secondary | ICD-10-CM | POA: Diagnosis present

## 2015-12-01 DIAGNOSIS — D509 Iron deficiency anemia, unspecified: Secondary | ICD-10-CM | POA: Diagnosis present

## 2015-12-01 DIAGNOSIS — I10 Essential (primary) hypertension: Secondary | ICD-10-CM | POA: Diagnosis present

## 2015-12-01 DIAGNOSIS — Z8249 Family history of ischemic heart disease and other diseases of the circulatory system: Secondary | ICD-10-CM | POA: Diagnosis not present

## 2015-12-01 DIAGNOSIS — I6789 Other cerebrovascular disease: Secondary | ICD-10-CM | POA: Diagnosis not present

## 2015-12-01 LAB — URINALYSIS, ROUTINE W REFLEX MICROSCOPIC
Bilirubin Urine: NEGATIVE
Glucose, UA: NEGATIVE mg/dL
Hgb urine dipstick: NEGATIVE
Ketones, ur: NEGATIVE mg/dL
LEUKOCYTES UA: NEGATIVE
NITRITE: NEGATIVE
PROTEIN: NEGATIVE mg/dL
Specific Gravity, Urine: 1.012 (ref 1.005–1.030)
pH: 6.5 (ref 5.0–8.0)

## 2015-12-01 LAB — AMMONIA: Ammonia: 11 umol/L (ref 9–35)

## 2015-12-01 LAB — CBC AND DIFFERENTIAL
HCT: 26 % — AB (ref 36–46)
HEMATOCRIT: 29 % — AB (ref 36–46)
HEMOGLOBIN: 7.8 g/dL — AB (ref 12.0–16.0)
Hemoglobin: 8.9 g/dL — AB (ref 12.0–16.0)
PLATELETS: 182 10*3/uL (ref 150–399)
Platelets: 174 10*3/uL (ref 150–399)
WBC: 3.6 10*3/mL
WBC: 4.6 10^3/mL

## 2015-12-01 LAB — CBC
HEMATOCRIT: 26 % — AB (ref 36.0–46.0)
HEMOGLOBIN: 7.8 g/dL — AB (ref 12.0–15.0)
MCH: 28.5 pg (ref 26.0–34.0)
MCHC: 30 g/dL (ref 30.0–36.0)
MCV: 94.9 fL (ref 78.0–100.0)
Platelets: 174 10*3/uL (ref 150–400)
RBC: 2.74 MIL/uL — ABNORMAL LOW (ref 3.87–5.11)
RDW: 17.9 % — ABNORMAL HIGH (ref 11.5–15.5)
WBC: 4.6 10*3/uL (ref 4.0–10.5)

## 2015-12-01 LAB — CBC WITH DIFFERENTIAL/PLATELET
BASOS ABS: 0 10*3/uL (ref 0.0–0.1)
BASOS PCT: 0 %
EOS PCT: 1 %
Eosinophils Absolute: 0 10*3/uL (ref 0.0–0.7)
HCT: 29.4 % — ABNORMAL LOW (ref 36.0–46.0)
Hemoglobin: 8.9 g/dL — ABNORMAL LOW (ref 12.0–15.0)
LYMPHS PCT: 16 %
Lymphs Abs: 0.6 10*3/uL — ABNORMAL LOW (ref 0.7–4.0)
MCH: 28.9 pg (ref 26.0–34.0)
MCHC: 30.3 g/dL (ref 30.0–36.0)
MCV: 95.5 fL (ref 78.0–100.0)
MONO ABS: 0.5 10*3/uL (ref 0.1–1.0)
Monocytes Relative: 13 %
Neutro Abs: 2.6 10*3/uL (ref 1.7–7.7)
Neutrophils Relative %: 70 %
PLATELETS: 182 10*3/uL (ref 150–400)
RBC: 3.08 MIL/uL — ABNORMAL LOW (ref 3.87–5.11)
RDW: 18 % — AB (ref 11.5–15.5)
WBC: 3.6 10*3/uL — ABNORMAL LOW (ref 4.0–10.5)

## 2015-12-01 LAB — TSH: TSH: 0.034 u[IU]/mL — ABNORMAL LOW (ref 0.350–4.500)

## 2015-12-01 LAB — CREATININE, SERUM
CREATININE: 1.12 mg/dL — AB (ref 0.44–1.00)
GFR, EST AFRICAN AMERICAN: 54 mL/min — AB (ref >60)
GFR, EST NON AFRICAN AMERICAN: 46 mL/min — AB (ref >60)

## 2015-12-01 LAB — FIBRINOGEN: FIBRINOGEN: 500 mg/dL — AB (ref 210–475)

## 2015-12-01 LAB — RAPID URINE DRUG SCREEN, HOSP PERFORMED
Amphetamines: NOT DETECTED
BARBITURATES: NOT DETECTED
Benzodiazepines: POSITIVE — AB
COCAINE: NOT DETECTED
Opiates: NOT DETECTED
Tetrahydrocannabinol: NOT DETECTED

## 2015-12-01 LAB — RETICULOCYTES
RBC.: 3.08 MIL/uL — AB (ref 3.87–5.11)
RETIC COUNT ABSOLUTE: 150.9 10*3/uL (ref 19.0–186.0)
RETIC CT PCT: 4.9 % — AB (ref 0.4–3.1)

## 2015-12-01 LAB — LACTATE DEHYDROGENASE: LDH: 100 U/L (ref 98–192)

## 2015-12-01 LAB — BASIC METABOLIC PANEL: Creatinine: 1.1 mg/dL (ref 0.5–1.1)

## 2015-12-01 LAB — LIPID PANEL
Cholesterol: 107 mg/dL (ref 0–200)
HDL: 27 mg/dL — ABNORMAL LOW (ref >40)
LDL CALC: 59 mg/dL (ref 0–99)
Total CHOL/HDL Ratio: 4 RATIO
Triglycerides: 107 mg/dL (ref <150)
VLDL: 21 mg/dL (ref 0–40)

## 2015-12-01 LAB — TROPONIN I: Troponin I: <0.03 ng/mL (ref <0.03)

## 2015-12-01 LAB — MRSA PCR SCREENING: MRSA BY PCR: NEGATIVE

## 2015-12-01 LAB — T4, FREE: Free T4: 1.44 ng/dL — ABNORMAL HIGH (ref 0.61–1.12)

## 2015-12-01 MED ORDER — SODIUM CHLORIDE 0.9 % IV SOLN
INTRAVENOUS | Status: DC
  Administered 2015-12-01: 02:00:00 via INTRAVENOUS

## 2015-12-01 MED ORDER — ASPIRIN EC 325 MG PO TBEC
325.0000 mg | DELAYED_RELEASE_TABLET | Freq: Every day | ORAL | Status: DC
  Administered 2015-12-01 – 2015-12-04 (×4): 325 mg via ORAL
  Filled 2015-12-01 (×4): qty 1

## 2015-12-01 MED ORDER — ENOXAPARIN SODIUM 40 MG/0.4ML ~~LOC~~ SOLN
40.0000 mg | SUBCUTANEOUS | Status: DC
  Filled 2015-12-01: qty 0.4

## 2015-12-01 MED ORDER — UMECLIDINIUM BROMIDE 62.5 MCG/INH IN AEPB
1.0000 | INHALATION_SPRAY | Freq: Every day | RESPIRATORY_TRACT | Status: DC
  Administered 2015-12-01 – 2015-12-04 (×4): 1 via RESPIRATORY_TRACT
  Filled 2015-12-01: qty 7

## 2015-12-01 MED ORDER — ALPRAZOLAM 0.25 MG PO TABS
0.2500 mg | ORAL_TABLET | Freq: Two times a day (BID) | ORAL | Status: DC | PRN
  Administered 2015-12-03 (×2): 0.25 mg via ORAL
  Filled 2015-12-01 (×2): qty 1

## 2015-12-01 MED ORDER — BUPROPION HCL ER (XL) 150 MG PO TB24
150.0000 mg | ORAL_TABLET | Freq: Every day | ORAL | Status: DC
  Administered 2015-12-01 – 2015-12-04 (×4): 150 mg via ORAL
  Filled 2015-12-01 (×4): qty 1

## 2015-12-01 MED ORDER — METHIMAZOLE 10 MG PO TABS
10.0000 mg | ORAL_TABLET | Freq: Two times a day (BID) | ORAL | Status: DC
  Administered 2015-12-01 – 2015-12-03 (×6): 10 mg via ORAL
  Filled 2015-12-01 (×6): qty 1

## 2015-12-01 MED ORDER — METOPROLOL TARTRATE 25 MG PO TABS
25.0000 mg | ORAL_TABLET | Freq: Two times a day (BID) | ORAL | Status: DC
  Filled 2015-12-01 (×2): qty 1

## 2015-12-01 MED ORDER — FERROUS FUMARATE 324 (106 FE) MG PO TABS
106.0000 mg | ORAL_TABLET | Freq: Every day | ORAL | Status: DC
  Administered 2015-12-01 – 2015-12-04 (×4): 106 mg via ORAL
  Filled 2015-12-01 (×4): qty 1

## 2015-12-01 MED ORDER — ACETAMINOPHEN 325 MG PO TABS
650.0000 mg | ORAL_TABLET | Freq: Four times a day (QID) | ORAL | Status: DC | PRN
  Administered 2015-12-01 – 2015-12-04 (×5): 650 mg via ORAL
  Filled 2015-12-01 (×5): qty 2

## 2015-12-01 NOTE — Progress Notes (Signed)
[facility] OF CARE NOTE Patient: Dawn [name] BEM:[id number]   PCP: [name], MD DOB: 03/21/1940   DOA: 11/30/2015   DOS: 12/01/2015    Patient was admitted by my colleague Dr. [name]  earlier on 12/01/2015. I have reviewed the H&P as well as assessment and plan and agree with the same. Important changes in the plan are listed below.  Plan of care: Principal Problem:   Acute encephalopathy Active Problems:   Normocytic anemia   Anxiety and depression   Chronic pain syndrome   COPD (chronic obstructive pulmonary disease) (HCC)   CKD (chronic kidney disease) stage 3, GFR 30-59 ml/min   HTN (hypertension)   Hyperthyroidism   Protein-calorie malnutrition, severe   Depression  Getting anemia workup. Also getting acute encephalopathy workup. Neurology will be following for the CVA.  Author: Berle Mull, MD [facility] Pager: [phone] 12/01/2015 5:28 PM   If 7PM-7AM, please contact night-coverage at [email or web address], password [facility]

## 2015-12-01 NOTE — Progress Notes (Signed)
Patient daughter called unit for update, questions answered.  Clarified with MD, continuing q2h vitals and neuro checks per stroke work-up protocol. Continue to monitor.

## 2015-12-01 NOTE — Procedures (Signed)
ELECTROENCEPHALOGRAM REPORT  Date of Study: 12/01/2015  Patient's Name: Dawn [name] MRN: [id number] Date of Birth: [birth year]  Referring Provider: Dr. Roland Rack  Clinical History: This is a 75 year old woman with sudden change in mental status.  Medications: ALPRAZolam (XANAX) tablet 0.25 mg  aspirin EC tablet 325 mg  buPROPion (WELLBUTRIN XL) 24 hr tablet 150 mg  Ferrous Fumarate (HEMOCYTE - 106 mg FE) tablet 106 mg of iron  methimazole (TAPAZOLE) tablet 10 mg  umeclidinium bromide (INCRUSE ELLIPTA) 62.5 MCG/INH 1 puff   Technical Summary: A multichannel digital EEG recording measured by the international 10-20 system with electrodes applied with paste and impedances below 5000 ohms performed as portable with EKG monitoring in an awake and asleep patient.  Hyperventilation and photic stimulation were not performed.  The digital EEG was referentially recorded, reformatted, and digitally filtered in a variety of bipolar and referential montages for optimal display.   Description: The patient is awake and asleep during the recording.  During maximal wakefulness, there is a symmetric, medium voltage 8 Hz posterior dominant rhythm that attenuates with eye opening. This is admixed with a small amount of diffuse 4-5 Hz theta and 2-3 Hz delta slowing of the waking background.  During drowsiness and sleep, there is an increase in theta slowing of the background.  Vertex waves and symmetric sleep spindles were seen.  Hyperventilation and photic stimulation were not performed.  There were no epileptiform discharges or electrographic seizures seen.    EKG lead was unremarkable.  Impression: This awake and asleep EEG is abnormal due to mild to moderate diffuse slowing of the waking background.  Clinical Correlation of the above findings indicates diffuse cerebral dysfunction that is non-specific in etiology and can be seen with hypoxic/ischemic injury, toxic/metabolic  encephalopathies, neurodegenerative disorders, or medication effect.  The absence of epileptiform discharges does not rule out a clinical diagnosis of epilepsy.  Clinical correlation is advised.   Ellouise Newer, M.D.

## 2015-12-01 NOTE — Progress Notes (Addendum)
PT Cancellation Note  Patient Details Name: KENNEDE LUSK MRN: [id number] DOB: 1940-09-27   Cancelled Treatment:    Reason Eval/Treat Not Completed: Patient at procedure or test/unavailable: EEG  Addendum- attempted to see @ 14:15 with pt off the floor again for testing.   Jeanie Cooks Kristy Catoe 12/01/2015, 11:34 AM Pager [phone]

## 2015-12-01 NOTE — Progress Notes (Signed)
VASCULAR LAB PRELIMINARY  PRELIMINARY  PRELIMINARY  PRELIMINARY  Carotid duplex completed.    Preliminary report:  Bilateral:  1-39% ICA stenosis.  Vertebral artery flow is antegrade.     Dawn [name], [place], RVS 12/01/2015, 5:40 PM

## 2015-12-01 NOTE — Progress Notes (Signed)
EEG completed, results pending. 

## 2015-12-01 NOTE — Progress Notes (Signed)
Subjective: Interval History: none.  Feeling better and less confused.  Objective: Vital signs in last 24 hours: Temp:  [97.3 F (36.3 C)-98.5 F (36.9 C)] 98.1 F (36.7 C) (11/21 1352) Pulse Rate:  [57-79] 79 (11/21 1352) Resp:  [16-23] 18 (11/21 1352) BP: (98-131)/(50-74) 104/58 (11/21 1352) SpO2:  [93 %-100 %] 98 % (11/21 1352) Weight:  [54.9 kg (121 lb 0.5 oz)-59.4 kg (130 lb 15.3 oz)] 54.9 kg (121 lb 0.5 oz) (11/20 2000)  Intake/Output from previous day: 11/20 0701 - 11/21 0700 In: 156.7 [I.V.:156.7] Out: -  Intake/Output this shift: Total I/O In: 240 [P.O.:240] Out: -  Nutritional status: Diet Heart Room service appropriate? Yes; Fluid consistency: Thin  Neuro Exam Mental Status: Awake, alert, oriented to person, age, place, month, year, knows president Cranial Nerves: intact Motor: Moves all extremities  Lab Results:  Recent Labs  11/30/15 1850 11/30/15 1856 12/01/15 0047 12/01/15 1215  WBC 6.7  --  4.6 3.6*  HGB 8.4* 9.2* 7.8* 8.9*  HCT 27.9* 27.0* 26.0* 29.4*  PLT 227  --  174 182  NA 135 136  --   --   K 4.7 4.7  --   --   CL 101 98*  --   --   CO2 28  --   --   --   GLUCOSE 92 91  --   --   BUN 16 19  --   --   CREATININE 1.16* 1.20* 1.12*  --   CALCIUM 8.3*  --   --   --    Lipid Panel  Recent Labs  12/01/15 0644  CHOL 107  TRIG 107  HDL 27*  CHOLHDL 4.0  VLDL 21  LDLCALC 59    Studies/Results: Dg Chest 1 View  Result Date: 11/30/2015 CLINICAL DATA:  Status post fall, with concern for chest injury. Initial encounter. EXAM: CHEST 1 VIEW COMPARISON:  Chest radiograph performed 11/10/2015, and CT of the chest performed 11/16/2015 FINDINGS: The lungs are hyperexpanded, with flattening of the hemidiaphragms, compatible with COPD. Mild bilateral atelectasis is seen. There is no evidence of pleural effusion or pneumothorax. The cardiomediastinal silhouette is enlarged. No acute osseous abnormalities are seen. Chronic right-sided rib deformities  are noted. IMPRESSION: 1. No displaced rib fracture seen. 2. Findings of COPD, with mild bilateral atelectasis. 3. Cardiomegaly. Electronically Signed   By: Garald Balding M.D.   On: 11/30/2015 21:25   Dg Lumbar Spine Complete  Result Date: 11/30/2015 CLINICAL DATA:  Status post fall, with lower back pain. Initial encounter. EXAM: LUMBAR SPINE - COMPLETE 4+ VIEW COMPARISON:  CT of the abdomen and pelvis performed 11/17/2015 FINDINGS: There is no evidence of fracture or subluxation. Vertebral bodies demonstrate normal height. Mild left convex thoracolumbar scoliosis is noted. Intervertebral disc spaces are preserved. Facet disease is noted along the lumbar spine. The visualized bowel gas pattern is unremarkable in appearance; air and stool are noted within the colon. The sacroiliac joints are within normal limits. Diffuse calcification is seen along the abdominal aorta acute IMPRESSION: 1. No evidence of fracture or subluxation along the lumbar spine. 2. Mild left convex thoracolumbar scoliosis, with mild degenerative change. 3. Diffuse aortic atherosclerosis. Electronically Signed   By: Garald Balding M.D.   On: 11/30/2015 21:23   Ct Cervical Spine Wo Contrast  Result Date: 11/30/2015 CLINICAL DATA:  Found unresponsive on floor at rehabilitation center. Weakness. History of spinal stenosis. EXAM: CT CERVICAL SPINE WITHOUT CONTRAST TECHNIQUE: Multidetector CT imaging of the cervical spine was  performed without intravenous contrast. Multiplanar CT image reconstructions were also generated. COMPARISON:  None. FINDINGS: ALIGNMENT: Broad reversed lordosis. Minimal grade 1 C3-4 anterolisthesis off spondylolysis. SKULL BASE AND VERTEBRAE: Cervical vertebral bodies and posterior elements are intact. Severe C4-5, C5-6 disc height loss, moderate to severe at C3-4 and C6-7 associated with uncovertebral hypertrophy, subchondral cysts and endplate spurring compatible with degenerative discs. RIGHT C2-3 facets are fused  on degenerative basis. Multilevel moderate to severe facet arthropathy. No destructive bony lesions. C1-2 articulation maintained. Calcified craniocervical ligaments. SOFT TISSUES AND SPINAL CANAL: Soft tissues are nonacute. Superficial 23 x 8 mm probable sebaceous cyst C3-4 paraspinal soft tissues. Mild calcific atherosclerosis of the carotid bifurcations. DISC LEVELS: No significant osseous canal stenosis. Severe LEFT C3-4, moderate to severe bilateral C4-5, severe bilateral C5-6 and moderate to severe LEFT C6-7 neural foraminal narrowing. UPPER CHEST: Lung apices are clear. OTHER: None. IMPRESSION: No acute fracture. Minimal C3-4 anterolisthesis on degenerative basis. Multilevel severe and moderate to severe neural foraminal narrowing. Electronically Signed   By: Elon Alas M.D.   On: 11/30/2015 20:38   Mr Jodene Nam Head Wo Contrast  Result Date: 11/30/2015 CLINICAL DATA:  Altered mental status after fall. EXAM: MRI HEAD WITHOUT CONTRAST MRA HEAD WITHOUT CONTRAST TECHNIQUE: Multiplanar, multiecho pulse sequences of the brain and surrounding structures were obtained without intravenous contrast. Angiographic images of the head were obtained using MRA technique without contrast. COMPARISON:  Head CT same day FINDINGS: MRI HEAD FINDINGS Brain: There is a punctate focus of diffusion restriction within the right frontal white matter at the base of the precentral gyrus. No other evidence of ischemia. No acute hemorrhage. The midline structures are normal. There is beginning confluent hyperintense T2-weighted signal within the periventricular and deep white matter, most often seen in the setting of chronic microvascular ischemia. No mass lesion or midline shift. No hydrocephalus or extra-axial fluid collection. No age advanced or lobar predominant atrophy. Vascular: Major intracranial arterial and venous sinus flow voids are preserved. No evidence of chronic microhemorrhage or amyloid angiopathy. Skull and upper  cervical spine: The visualized skull base, calvarium, upper cervical spine and extracranial soft tissues are normal. Sinuses/Orbits: Left maxillary retention cyst. Normal orbits. MRA HEAD FINDINGS Intracranial internal carotid arteries: Normal. Anterior cerebral arteries: Normal. Middle cerebral arteries: Normal. Posterior communicating arteries: Present on the right. Posterior cerebral arteries: Normal. Basilar artery: Normal. Vertebral arteries: Left dominant. Normal. Superior cerebellar arteries: Normal. Anterior inferior cerebellar arteries: Normal. Posterior inferior cerebellar arteries: Normal on the left. Not identified on the right. Variable MRA appearance of the posterior inferior cerebellar arteries is not uncommon. IMPRESSION: 1. Punctate focus of acute ischemia within the right frontal white matter at the base of the precentral gyrus. No hemorrhage or mass effect. 2. Chronic microvascular ischemia. 3. Normal MRA of the circle of Willis and intracranial arteries. Electronically Signed   By: Ulyses Jarred M.D.   On: 11/30/2015 23:31   Mr Brain Wo Contrast  Result Date: 11/30/2015 CLINICAL DATA:  Altered mental status after fall. EXAM: MRI HEAD WITHOUT CONTRAST MRA HEAD WITHOUT CONTRAST TECHNIQUE: Multiplanar, multiecho pulse sequences of the brain and surrounding structures were obtained without intravenous contrast. Angiographic images of the head were obtained using MRA technique without contrast. COMPARISON:  Head CT same day FINDINGS: MRI HEAD FINDINGS Brain: There is a punctate focus of diffusion restriction within the right frontal white matter at the base of the precentral gyrus. No other evidence of ischemia. No acute hemorrhage. The midline structures are normal. There is  beginning confluent hyperintense T2-weighted signal within the periventricular and deep white matter, most often seen in the setting of chronic microvascular ischemia. No mass lesion or midline shift. No hydrocephalus or  extra-axial fluid collection. No age advanced or lobar predominant atrophy. Vascular: Major intracranial arterial and venous sinus flow voids are preserved. No evidence of chronic microhemorrhage or amyloid angiopathy. Skull and upper cervical spine: The visualized skull base, calvarium, upper cervical spine and extracranial soft tissues are normal. Sinuses/Orbits: Left maxillary retention cyst. Normal orbits. MRA HEAD FINDINGS Intracranial internal carotid arteries: Normal. Anterior cerebral arteries: Normal. Middle cerebral arteries: Normal. Posterior communicating arteries: Present on the right. Posterior cerebral arteries: Normal. Basilar artery: Normal. Vertebral arteries: Left dominant. Normal. Superior cerebellar arteries: Normal. Anterior inferior cerebellar arteries: Normal. Posterior inferior cerebellar arteries: Normal on the left. Not identified on the right. Variable MRA appearance of the posterior inferior cerebellar arteries is not uncommon. IMPRESSION: 1. Punctate focus of acute ischemia within the right frontal white matter at the base of the precentral gyrus. No hemorrhage or mass effect. 2. Chronic microvascular ischemia. 3. Normal MRA of the circle of Willis and intracranial arteries. Electronically Signed   By: Ulyses Jarred M.D.   On: 11/30/2015 23:31   Dg Hips Bilat W Or Wo Pelvis 3-4 Views  Result Date: 11/30/2015 CLINICAL DATA:  Status post fall. Concern for pelvic injury. Initial encounter. EXAM: DG HIP (WITH OR WITHOUT PELVIS) 3-4V BILAT COMPARISON:  None. FINDINGS: There is no evidence of fracture or dislocation. Both femoral heads are seated normally within their respective acetabula. The proximal femurs appear intact bilaterally. No significant degenerative change is appreciated. The sacroiliac joints are unremarkable in appearance. The visualized bowel gas pattern is grossly unremarkable in appearance. IMPRESSION: No evidence of fracture or dislocation. Electronically Signed   By:  Garald Balding M.D.   On: 11/30/2015 21:21   Ct Head Code Stroke W/o Cm  Result Date: 11/30/2015 CLINICAL DATA:  Code stroke. LEFT-sided weakness and slurred speech. Found on floor unresponsive . Last seen normal at 1500 hours. History of urinary tract infection. EXAM: CT HEAD WITHOUT CONTRAST TECHNIQUE: Contiguous axial images were obtained from the base of the skull through the vertex without intravenous contrast. COMPARISON:  CT HEAD November 10, 2015 FINDINGS: BRAIN: The ventricles and sulci are normal for age. No intraparenchymal hemorrhage, mass effect nor midline shift. Patchy supratentorial white matter hypodensities within normal range for patient's age, though non-specific are most compatible with chronic small vessel ischemic disease. No acute large vascular territory infarcts. No abnormal extra-axial fluid collections. Basal cisterns are patent. VASCULAR: Mild to moderate calcific atherosclerosis of the carotid siphons. SKULL: No skull fracture. Moderate RIGHT temporomandibular osteoarthrosis. No significant scalp soft tissue swelling. SINUSES/ORBITS: Small LEFT maxillary sinus air-fluid level with frothy secretions, improved. Mild paranasal sinus mucosal thickening. Mastoid air cells are well aerated. Soft tissue within the external auditory canals compatible with cerumen. The included ocular globes and orbital contents are non-suspicious. 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 ASPECTS [facility] Stroke Program Early CT Score) - Ganglionic level infarction (caudate, lentiform nuclei, internal capsule, insula, M1-M3 cortex): 7 - Supraganglionic infarction (M4-M6 cortex): 3 Total score (0-10 with 10 being normal): 10 IMPRESSION: 1. No acute intracranial process ; negative CT HEAD for age. 2. ASPECTS is 10. Critical Value/emergent results were called by telephone at the time of interpretation on 11/30/2015 at 7:21 pm to Dr. Leonel Ramsay, Neurology, who verbally acknowledged these  results. Electronically Signed   By: Elon Alas  M.D.   On: 11/30/2015 19:22    Medications: I have reviewed the patient's current medications.  Assessment/Plan: 1. Encephalopathy Appears to be nearly completely resolved.  Likely from medication effect.  The small possible stroke seen on MRI would not account for her symptoms.  The EEG shows no evidence for seizures but confirms encephalopathy. 2. Small stroke on MRI Can complete the remainder of the stroke work-up.  This is likely an incidental finding.   LOS: 0 days   Doren Custard

## 2015-12-01 NOTE — Consult Note (Signed)
SLP Cancellation Note  Patient Details Name: Dawn [name] MRN: [id number] DOB: 1940-09-06   Cancelled treatment:        Unable to complete SLE at this time, as pt is currently unavailable. Will continue efforts.  Shonna Chock 12/01/2015, 11:40 AM  Enriqueta Shutter. [place], [place], [place]

## 2015-12-01 NOTE — H&P (Addendum)
History and Physical    Dawn [name] VOJ:[id number] DOB: [birth year] DOA: 11/30/2015  PCP: [name], MD  Patient coming from: Home.  Chief Complaint: Altered mental status.  HPI: Dawn [name] is a 75 y.o. female with admitted 3 weeks ago for encephalopathy in the setting of possible sepsis and newly diagnosed hyperthyroidism was brought to the ER after family found the patient had a sudden change in mental status. Was found on the floor last evening around 3 PM. Patient was brought as a code stroke in the ER. On the way to the ER EMS staff noticed patient had pinpoint pupils and was given Narcan with some improvement in mental status. CT of the head did not show anything acute. Neurologist was consulted and MRI brain was ordered. Which shows a small punctate infarct.. On my exam patient is still lethargic but is oriented to time place and person. Moves all extremities.  ED Course: CT of the head did not show anything acute. ABG did not show any carbon dioxide retention. MRI of the brain shows a small punctate infarct.  Review of Systems: As per HPI, rest all negative.   Past Medical History:  Diagnosis Date  . COPD (chronic obstructive pulmonary disease) ([place])   . History of anxiety   . History of depression   . Spinal stenosis   . UTI (urinary tract infection)     Past Surgical History:  Procedure Laterality Date  . FLEXIBLE SIGMOIDOSCOPY Left 11/18/2015   Procedure: FLEXIBLE SIGMOIDOSCOPY;  Surgeon: [name], MD;  Location: [place];  Service: Endoscopy;  Laterality: Left;  . FLEXIBLE SIGMOIDOSCOPY N/A 11/20/2015   Procedure: FLEXIBLE SIGMOIDOSCOPY;  Surgeon: [name], MD;  Location: [facility] ENDOSCOPY;  Service: Endoscopy;  Laterality: N/A;  . NO PAST SURGERIES       reports that she has quit smoking. She has never used smokeless tobacco. She reports that she does not drink alcohol. Her drug history is not on file.  No Known Allergies  Family History    Problem Relation Age of Onset  . Heart attack Mother   . Diabetes Father     Prior to Admission medications   Medication Sig Start Date End Date Taking? Authorizing Provider  ALPRAZolam Duanne Moron) 1 MG tablet Take 1 mg by mouth 3 (three) times daily as needed for anxiety.   Yes Historical Provider, MD  buPROPion (WELLBUTRIN XL) 150 MG 24 hr tablet Take 150 mg by mouth daily.   Yes Historical Provider, MD  FERROCITE 324 MG TABS tablet Take 324 mg by mouth daily. 11/02/15  Yes Historical Provider, MD  gabapentin (NEURONTIN) 600 MG tablet Take 600 mg by mouth 3 (three) times daily. 10/19/15  Yes Historical Provider, MD  INCRUSE ELLIPTA 62.5 MCG/INH AEPB Inhale 1 puff into the lungs daily. 10/19/15  Yes Historical Provider, MD  methimazole (TAPAZOLE) 10 MG tablet Take 1 tablet (10 mg total) by mouth 2 (two) times daily. 11/20/15 12/20/15 Yes Jennifer Chahn-Yang Choi, DO  metoprolol tartrate (LOPRESSOR) 25 MG tablet Take 1 tablet (25 mg total) by mouth 2 (two) times daily. 11/20/15 12/20/15 Yes Jennifer Chahn-Yang Choi, DO  Oxycodone HCl 10 MG TABS Take 10 mg by mouth every 8 (eight) hours. 10/28/15  Yes Historical Provider, MD  traZODone (DESYREL) 100 MG tablet Take 100 mg by mouth at bedtime.   Yes Historical Provider, MD    Physical Exam: Vitals:   11/30/15 2103 11/30/15 2115 11/30/15 2130 11/30/15 2330  BP: 111/67 112/56 (!) 99/50 Marland Kitchen)  115/58  Pulse: 71 70 71 70  Resp: '20 23 17   '$ Temp:    98.5 F (36.9 C)  TempSrc:    Oral  SpO2: 100% 100% 100% 94%  Weight:      Height:    '5\' 6"'$  (1.676 m)      Constitutional: Moderately built and nourished. Vitals:   11/30/15 2103 11/30/15 2115 11/30/15 2130 11/30/15 2330  BP: 111/67 112/56 (!) 99/50 (!) 115/58  Pulse: 71 70 71 70  Resp: '20 23 17   '$ Temp:    98.5 F (36.9 C)  TempSrc:    Oral  SpO2: 100% 100% 100% 94%  Weight:      Height:    '5\' 6"'$  (1.676 m)   Eyes: Anicteric no pallor. ENMT: No discharge from the ears eyes nose or  mouth. Neck: No mass felt. No neck rigidity. Respiratory: No rhonchi or crepitations. Cardiovascular: S1 and S2 heard. No murmurs appreciated. Abdomen: Soft nontender bowel sounds present. No guarding or rigidity. Musculoskeletal: No edema. No joint effusion. Skin: No rash. Skin appears warm. Neurologic: Drowsy but oriented to time place and person. Moves all extremities. Psychiatric: Drowsy.   Labs on Admission: I have personally reviewed following labs and imaging studies  CBC:  Recent Labs Lab 11/30/15 1850 11/30/15 1856  WBC 6.7  --   NEUTROABS 4.5  --   HGB 8.4* 9.2*  HCT 27.9* 27.0*  MCV 94.6  --   PLT 227  --    Basic Metabolic Panel:  Recent Labs Lab 11/30/15 1850 11/30/15 1856  NA 135 136  K 4.7 4.7  CL 101 98*  CO2 28  --   GLUCOSE 92 91  BUN 16 19  CREATININE 1.16* 1.20*  CALCIUM 8.3*  --    GFR: Estimated Creatinine Clearance: 34.6 mL/min (by C-G formula based on SCr of 1.2 mg/dL (H)). Liver Function Tests:  Recent Labs Lab 11/30/15 1850  AST 11*  ALT 9*  ALKPHOS 68  BILITOT 0.3  PROT 5.7*  ALBUMIN 2.6*   No results for input(s): LIPASE, AMYLASE in the last 168 hours. No results for input(s): AMMONIA in the last 168 hours. Coagulation Profile:  Recent Labs Lab 11/30/15 1850  INR 1.23   Cardiac Enzymes: No results for input(s): CKTOTAL, CKMB, CKMBINDEX, TROPONINI in the last 168 hours. BNP (last 3 results) No results for input(s): PROBNP in the last 8760 hours. HbA1C: No results for input(s): HGBA1C in the last 72 hours. CBG:  Recent Labs Lab 11/30/15 1911  GLUCAP 79   Lipid Profile: No results for input(s): CHOL, HDL, LDLCALC, TRIG, CHOLHDL, LDLDIRECT in the last 72 hours. Thyroid Function Tests: No results for input(s): TSH, T4TOTAL, FREET4, T3FREE, THYROIDAB in the last 72 hours. Anemia Panel: No results for input(s): VITAMINB12, FOLATE, FERRITIN, TIBC, IRON, RETICCTPCT in the last 72 hours. Urine analysis:     Component Value Date/Time   COLORURINE AMBER (A) 11/10/2015 0230   APPEARANCEUR CLEAR 11/10/2015 0230   LABSPEC 1.018 11/10/2015 0230   PHURINE 6.0 11/10/2015 0230   GLUCOSEU NEGATIVE 11/10/2015 0230   HGBUR NEGATIVE 11/10/2015 0230   BILIRUBINUR SMALL (A) 11/10/2015 0230   KETONESUR 15 (A) 11/10/2015 0230   PROTEINUR 30 (A) 11/10/2015 0230   UROBILINOGEN 0.2 04/29/2008 2235   NITRITE NEGATIVE 11/10/2015 0230   LEUKOCYTESUR SMALL (A) 11/10/2015 0230   Sepsis Labs: '@LABRCNTIP'$ (procalcitonin:4,lacticidven:4) )No results found for this or any previous visit (from the past 240 hour(s)).   Radiological Exams on Admission: Dg  Chest 1 View  Result Date: 11/30/2015 CLINICAL DATA:  Status post fall, with concern for chest injury. Initial encounter. EXAM: CHEST 1 VIEW COMPARISON:  Chest radiograph performed 11/10/2015, and CT of the chest performed 11/16/2015 FINDINGS: The lungs are hyperexpanded, with flattening of the hemidiaphragms, compatible with COPD. Mild bilateral atelectasis is seen. There is no evidence of pleural effusion or pneumothorax. The cardiomediastinal silhouette is enlarged. No acute osseous abnormalities are seen. Chronic right-sided rib deformities are noted. IMPRESSION: 1. No displaced rib fracture seen. 2. Findings of COPD, with mild bilateral atelectasis. 3. Cardiomegaly. Electronically Signed   By: Garald Balding M.D.   On: 11/30/2015 21:25   Dg Lumbar Spine Complete  Result Date: 11/30/2015 CLINICAL DATA:  Status post fall, with lower back pain. Initial encounter. EXAM: LUMBAR SPINE - COMPLETE 4+ VIEW COMPARISON:  CT of the abdomen and pelvis performed 11/17/2015 FINDINGS: There is no evidence of fracture or subluxation. Vertebral bodies demonstrate normal height. Mild left convex thoracolumbar scoliosis is noted. Intervertebral disc spaces are preserved. Facet disease is noted along the lumbar spine. The visualized bowel gas pattern is unremarkable in appearance; air and  stool are noted within the colon. The sacroiliac joints are within normal limits. Diffuse calcification is seen along the abdominal aorta acute IMPRESSION: 1. No evidence of fracture or subluxation along the lumbar spine. 2. Mild left convex thoracolumbar scoliosis, with mild degenerative change. 3. Diffuse aortic atherosclerosis. Electronically Signed   By: Garald Balding M.D.   On: 11/30/2015 21:23   Ct Cervical Spine Wo Contrast  Result Date: 11/30/2015 CLINICAL DATA:  Found unresponsive on floor at rehabilitation center. Weakness. History of spinal stenosis. EXAM: CT CERVICAL SPINE WITHOUT CONTRAST TECHNIQUE: Multidetector CT imaging of the cervical spine was performed without intravenous contrast. Multiplanar CT image reconstructions were also generated. COMPARISON:  None. FINDINGS: ALIGNMENT: Broad reversed lordosis. Minimal grade 1 C3-4 anterolisthesis off spondylolysis. SKULL BASE AND VERTEBRAE: Cervical vertebral bodies and posterior elements are intact. Severe C4-5, C5-6 disc height loss, moderate to severe at C3-4 and C6-7 associated with uncovertebral hypertrophy, subchondral cysts and endplate spurring compatible with degenerative discs. RIGHT C2-3 facets are fused on degenerative basis. Multilevel moderate to severe facet arthropathy. No destructive bony lesions. C1-2 articulation maintained. Calcified craniocervical ligaments. SOFT TISSUES AND SPINAL CANAL: Soft tissues are nonacute. Superficial 23 x 8 mm probable sebaceous cyst C3-4 paraspinal soft tissues. Mild calcific atherosclerosis of the carotid bifurcations. DISC LEVELS: No significant osseous canal stenosis. Severe LEFT C3-4, moderate to severe bilateral C4-5, severe bilateral C5-6 and moderate to severe LEFT C6-7 neural foraminal narrowing. UPPER CHEST: Lung apices are clear. OTHER: None. IMPRESSION: No acute fracture. Minimal C3-4 anterolisthesis on degenerative basis. Multilevel severe and moderate to severe neural foraminal  narrowing. Electronically Signed   By: Elon Alas M.D.   On: 11/30/2015 20:38   Mr Jodene Nam Head Wo Contrast  Result Date: 11/30/2015 CLINICAL DATA:  Altered mental status after fall. EXAM: MRI HEAD WITHOUT CONTRAST MRA HEAD WITHOUT CONTRAST TECHNIQUE: Multiplanar, multiecho pulse sequences of the brain and surrounding structures were obtained without intravenous contrast. Angiographic images of the head were obtained using MRA technique without contrast. COMPARISON:  Head CT same day FINDINGS: MRI HEAD FINDINGS Brain: There is a punctate focus of diffusion restriction within the right frontal white matter at the base of the precentral gyrus. No other evidence of ischemia. No acute hemorrhage. The midline structures are normal. There is beginning confluent hyperintense T2-weighted signal within the periventricular and deep white matter,  most often seen in the setting of chronic microvascular ischemia. No mass lesion or midline shift. No hydrocephalus or extra-axial fluid collection. No age advanced or lobar predominant atrophy. Vascular: Major intracranial arterial and venous sinus flow voids are preserved. No evidence of chronic microhemorrhage or amyloid angiopathy. Skull and upper cervical spine: The visualized skull base, calvarium, upper cervical spine and extracranial soft tissues are normal. Sinuses/Orbits: Left maxillary retention cyst. Normal orbits. MRA HEAD FINDINGS Intracranial internal carotid arteries: Normal. Anterior cerebral arteries: Normal. Middle cerebral arteries: Normal. Posterior communicating arteries: Present on the right. Posterior cerebral arteries: Normal. Basilar artery: Normal. Vertebral arteries: Left dominant. Normal. Superior cerebellar arteries: Normal. Anterior inferior cerebellar arteries: Normal. Posterior inferior cerebellar arteries: Normal on the left. Not identified on the right. Variable MRA appearance of the posterior inferior cerebellar arteries is not uncommon.  IMPRESSION: 1. Punctate focus of acute ischemia within the right frontal white matter at the base of the precentral gyrus. No hemorrhage or mass effect. 2. Chronic microvascular ischemia. 3. Normal MRA of the circle of Willis and intracranial arteries. Electronically Signed   By: Ulyses Jarred M.D.   On: 11/30/2015 23:31   Mr Brain Wo Contrast  Result Date: 11/30/2015 CLINICAL DATA:  Altered mental status after fall. EXAM: MRI HEAD WITHOUT CONTRAST MRA HEAD WITHOUT CONTRAST TECHNIQUE: Multiplanar, multiecho pulse sequences of the brain and surrounding structures were obtained without intravenous contrast. Angiographic images of the head were obtained using MRA technique without contrast. COMPARISON:  Head CT same day FINDINGS: MRI HEAD FINDINGS Brain: There is a punctate focus of diffusion restriction within the right frontal white matter at the base of the precentral gyrus. No other evidence of ischemia. No acute hemorrhage. The midline structures are normal. There is beginning confluent hyperintense T2-weighted signal within the periventricular and deep white matter, most often seen in the setting of chronic microvascular ischemia. No mass lesion or midline shift. No hydrocephalus or extra-axial fluid collection. No age advanced or lobar predominant atrophy. Vascular: Major intracranial arterial and venous sinus flow voids are preserved. No evidence of chronic microhemorrhage or amyloid angiopathy. Skull and upper cervical spine: The visualized skull base, calvarium, upper cervical spine and extracranial soft tissues are normal. Sinuses/Orbits: Left maxillary retention cyst. Normal orbits. MRA HEAD FINDINGS Intracranial internal carotid arteries: Normal. Anterior cerebral arteries: Normal. Middle cerebral arteries: Normal. Posterior communicating arteries: Present on the right. Posterior cerebral arteries: Normal. Basilar artery: Normal. Vertebral arteries: Left dominant. Normal. Superior cerebellar arteries:  Normal. Anterior inferior cerebellar arteries: Normal. Posterior inferior cerebellar arteries: Normal on the left. Not identified on the right. Variable MRA appearance of the posterior inferior cerebellar arteries is not uncommon. IMPRESSION: 1. Punctate focus of acute ischemia within the right frontal white matter at the base of the precentral gyrus. No hemorrhage or mass effect. 2. Chronic microvascular ischemia. 3. Normal MRA of the circle of Willis and intracranial arteries. Electronically Signed   By: Ulyses Jarred M.D.   On: 11/30/2015 23:31   Dg Hips Bilat W Or Wo Pelvis 3-4 Views  Result Date: 11/30/2015 CLINICAL DATA:  Status post fall. Concern for pelvic injury. Initial encounter. EXAM: DG HIP (WITH OR WITHOUT PELVIS) 3-4V BILAT COMPARISON:  None. FINDINGS: There is no evidence of fracture or dislocation. Both femoral heads are seated normally within their respective acetabula. The proximal femurs appear intact bilaterally. No significant degenerative change is appreciated. The sacroiliac joints are unremarkable in appearance. The visualized bowel gas pattern is grossly unremarkable in appearance. IMPRESSION: No evidence  of fracture or dislocation. Electronically Signed   By: Garald Balding M.D.   On: 11/30/2015 21:21   Ct Head Code Stroke W/o Cm  Result Date: 11/30/2015 CLINICAL DATA:  Code stroke. LEFT-sided weakness and slurred speech. Found on floor unresponsive . Last seen normal at 1500 hours. History of urinary tract infection. EXAM: CT HEAD WITHOUT CONTRAST TECHNIQUE: Contiguous axial images were obtained from the base of the skull through the vertex without intravenous contrast. COMPARISON:  CT HEAD November 10, 2015 FINDINGS: BRAIN: The ventricles and sulci are normal for age. No intraparenchymal hemorrhage, mass effect nor midline shift. Patchy supratentorial white matter hypodensities within normal range for patient's age, though non-specific are most compatible with chronic small  vessel ischemic disease. No acute large vascular territory infarcts. No abnormal extra-axial fluid collections. Basal cisterns are patent. VASCULAR: Mild to moderate calcific atherosclerosis of the carotid siphons. SKULL: No skull fracture. Moderate RIGHT temporomandibular osteoarthrosis. No significant scalp soft tissue swelling. SINUSES/ORBITS: Small LEFT maxillary sinus air-fluid level with frothy secretions, improved. Mild paranasal sinus mucosal thickening. Mastoid air cells are well aerated. Soft tissue within the external auditory canals compatible with cerumen. The included ocular globes and orbital contents are non-suspicious. 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 ASPECTS [facility] Stroke Program Early CT Score) - Ganglionic level infarction (caudate, lentiform nuclei, internal capsule, insula, M1-M3 cortex): 7 - Supraganglionic infarction (M4-M6 cortex): 3 Total score (0-10 with 10 being normal): 10 IMPRESSION: 1. No acute intracranial process ; negative CT HEAD for age. 2. ASPECTS is 10. Critical Value/emergent results were called by telephone at the time of interpretation on 11/30/2015 at 7:21 pm to Dr. Leonel Ramsay, Neurology, who verbally acknowledged these results. Electronically Signed   By: Elon Alas M.D.   On: 11/30/2015 19:22    EKG: Independently reviewed. Normal sinus rhythm with anterior T-wave inversions.  Assessment/Plan Active Problems:   Chronic pain syndrome   COPD (chronic obstructive pulmonary disease) (HCC)   CKD (chronic kidney disease) stage 3, GFR 30-59 ml/min   HTN (hypertension)   Acute encephalopathy   Hyperthyroidism   Depression    1. Acute encephalopathy -  MRI brain does show stroke which may not completely account for patient's mental status changes. At this time holding off patient's sedatives and pain relief medications and closely follow mental status changes. Check EEG ammonia levels. 2. Stroke - MRI brain does show stroke.  Will get 2-D echo and carotid Dopplers physical therapy consult to complete stroke workup. Check hemoglobin A1c and lipid panel. 3. Chronic anemia - follow CBC. 4. Chronic pain - see #1. 5. Hyperthyroidism recently diagnosed - continue Tapazole and metoprolol. 6. Recent SVT on metoprolol. 7. Chronic kidney disease stage III - creatinine appears to be at baseline. 8. COPD - not wheezing continue inhalers. 9. EKG shows T-wave inversion in anterior leads - check 2-D echo and troponins.   DVT prophylaxis: Lovenox. Code Status: Full code.  Family Communication: Discussed with patient.  Disposition Plan: Home.  Consults called: Neurology.  Admission status: Observation.    [name] MD [facility] Pager [phone].  If 7PM-7AM, please contact night-coverage [email or web address] Password [facility]  12/01/2015, 12:38 AM

## 2015-12-02 ENCOUNTER — Inpatient Hospital Stay (HOSPITAL_COMMUNITY): Payer: Medicare Other

## 2015-12-02 DIAGNOSIS — I6789 Other cerebrovascular disease: Secondary | ICD-10-CM

## 2015-12-02 LAB — CBC
HCT: 26.5 % — ABNORMAL LOW (ref 36.0–46.0)
Hemoglobin: 8.2 g/dL — ABNORMAL LOW (ref 12.0–15.0)
MCH: 28.7 pg (ref 26.0–34.0)
MCHC: 30.9 g/dL (ref 30.0–36.0)
MCV: 92.7 fL (ref 78.0–100.0)
PLATELETS: 181 10*3/uL (ref 150–400)
RBC: 2.86 MIL/uL — AB (ref 3.87–5.11)
RDW: 17.6 % — AB (ref 11.5–15.5)
WBC: 5.8 10*3/uL (ref 4.0–10.5)

## 2015-12-02 LAB — CBC WITH DIFFERENTIAL/PLATELET
Basophils Absolute: 0 10*3/uL (ref 0.0–0.1)
Basophils Relative: 0 %
EOS ABS: 0 10*3/uL (ref 0.0–0.7)
Eosinophils Relative: 0 %
HEMATOCRIT: 25.4 % — AB (ref 36.0–46.0)
HEMOGLOBIN: 7.6 g/dL — AB (ref 12.0–15.0)
LYMPHS ABS: 0.9 10*3/uL (ref 0.7–4.0)
LYMPHS PCT: 21 %
MCH: 27.9 pg (ref 26.0–34.0)
MCHC: 29.9 g/dL — ABNORMAL LOW (ref 30.0–36.0)
MCV: 93.4 fL (ref 78.0–100.0)
Monocytes Absolute: 0.6 10*3/uL (ref 0.1–1.0)
Monocytes Relative: 13 %
NEUTROS ABS: 2.9 10*3/uL (ref 1.7–7.7)
NEUTROS PCT: 66 %
Platelets: 172 10*3/uL (ref 150–400)
RBC: 2.72 MIL/uL — AB (ref 3.87–5.11)
RDW: 17.6 % — ABNORMAL HIGH (ref 11.5–15.5)
WBC: 4.3 10*3/uL (ref 4.0–10.5)

## 2015-12-02 LAB — BASIC METABOLIC PANEL
BUN: 8 mg/dL (ref 4–21)
Creatinine: 1 mg/dL (ref 0.5–1.1)
Glucose: 94 mg/dL
Potassium: 3.8 mmol/L (ref 3.4–5.3)
SODIUM: 137 mmol/L (ref 137–147)

## 2015-12-02 LAB — HEMOGLOBIN A1C
Hgb A1c MFr Bld: 5.2 % (ref 4.8–5.6)
Mean Plasma Glucose: 103 mg/dL

## 2015-12-02 LAB — FOLATE: Folate: 18 ng/mL (ref >5.9)

## 2015-12-02 LAB — C DIFFICILE QUICK SCREEN W PCR REFLEX
C Diff antigen: NEGATIVE
C Diff interpretation: NOT DETECTED
C Diff toxin: NEGATIVE

## 2015-12-02 LAB — VAS US CAROTID
LCCAPDIAS: 17 cm/s
LCCAPSYS: 78 cm/s
LEFT ECA DIAS: -8 cm/s
LEFT VERTEBRAL DIAS: -11 cm/s
Left CCA dist dias: -22 cm/s
Left CCA dist sys: -93 cm/s
Left ICA dist dias: -14 cm/s
Left ICA dist sys: -63 cm/s
Left ICA prox dias: -27 cm/s
Left ICA prox sys: -131 cm/s
RCCADSYS: 60 cm/s
RCCAPDIAS: 14 cm/s
RIGHT ECA DIAS: -4 cm/s
RIGHT VERTEBRAL DIAS: -21 cm/s
Right CCA prox sys: 81 cm/s

## 2015-12-02 LAB — IRON AND TIBC
Iron: 11 ug/dL — ABNORMAL LOW (ref 28–170)
Saturation Ratios: 6 % — ABNORMAL LOW (ref 10.4–31.8)
TIBC: 179 ug/dL — AB (ref 250–450)
UIBC: 168 ug/dL

## 2015-12-02 LAB — ECHOCARDIOGRAM COMPLETE
HEIGHTINCHES: 66 in
Weight: 1936.52 oz

## 2015-12-02 LAB — CBC AND DIFFERENTIAL
HEMATOCRIT: 25 % — AB (ref 36–46)
HEMATOCRIT: 27 % — AB (ref 36–46)
HEMOGLOBIN: 8.2 g/dL — AB (ref 12.0–16.0)
Hemoglobin: 7.6 g/dL — AB (ref 12.0–16.0)
PLATELETS: 172 10*3/uL (ref 150–399)
Platelets: 181 10*3/uL (ref 150–399)
WBC: 4.3 10^3/mL
WBC: 5.8 10*3/mL

## 2015-12-02 LAB — COMPREHENSIVE METABOLIC PANEL
ALT: 6 U/L — AB (ref 14–54)
ANION GAP: 7 (ref 5–15)
AST: 8 U/L — ABNORMAL LOW (ref 15–41)
Albumin: 2.2 g/dL — ABNORMAL LOW (ref 3.5–5.0)
Alkaline Phosphatase: 56 U/L (ref 38–126)
BUN: 8 mg/dL (ref 6–20)
CHLORIDE: 103 mmol/L (ref 101–111)
CO2: 27 mmol/L (ref 22–32)
Calcium: 8 mg/dL — ABNORMAL LOW (ref 8.9–10.3)
Creatinine, Ser: 0.96 mg/dL (ref 0.44–1.00)
GFR calc Af Amer: >60 mL/min (ref >60)
GFR, EST NON AFRICAN AMERICAN: 56 mL/min — AB (ref >60)
Glucose, Bld: 94 mg/dL (ref 65–99)
POTASSIUM: 3.8 mmol/L (ref 3.5–5.1)
SODIUM: 137 mmol/L (ref 135–145)
Total Bilirubin: 0.3 mg/dL (ref 0.3–1.2)
Total Protein: 5.2 g/dL — ABNORMAL LOW (ref 6.5–8.1)

## 2015-12-02 LAB — RETICULOCYTES
RBC.: 2.96 MIL/uL — AB (ref 3.87–5.11)
RETIC COUNT ABSOLUTE: 142.1 10*3/uL (ref 19.0–186.0)
RETIC CT PCT: 4.8 % — AB (ref 0.4–3.1)

## 2015-12-02 LAB — MAGNESIUM: Magnesium: 1.8 mg/dL (ref 1.7–2.4)

## 2015-12-02 LAB — FERRITIN: FERRITIN: 249 ng/mL (ref 11–307)

## 2015-12-02 LAB — VITAMIN B12: VITAMIN B 12: 559 pg/mL (ref 180–914)

## 2015-12-02 MED ORDER — LOPERAMIDE HCL 2 MG PO CAPS
2.0000 mg | ORAL_CAPSULE | Freq: Two times a day (BID) | ORAL | Status: DC
  Administered 2015-12-02 – 2015-12-04 (×5): 2 mg via ORAL
  Filled 2015-12-02 (×5): qty 1

## 2015-12-02 NOTE — Progress Notes (Signed)
On call paged of patient's several loose/mucous stools.  Patient placed on enteric precautions at this time. Orders placed 0618 for c.diff PCR and enteric precautions. Stool collected and taken to lab 0655. Continue to monitor.

## 2015-12-02 NOTE — Evaluation (Signed)
Physical Therapy Evaluation Patient Details Name: Dawn [name] MRN: [id number] DOB: [birth year] Today's Date: 12/02/2015   History of Present Illness  Dawn [name] is a 75 year old female who presents with altered mental status. She was found down and brought into the emergency room as a code stroke.  Clinical Impression  Pt admitted with above diagnosis. Pt currently with functional limitations due to the deficits listed below (see PT Problem List).  Pt will benefit from skilled PT to increase their independence and safety with mobility to allow discharge to the venue listed below.  Pt reluctantly agreeable to ambulate short distance in room.  She appears pale and weak. Her cognition fluctuated during session. Recommend returning to SNF.       Follow Up Recommendations SNF;Supervision for mobility/OOB    Equipment Recommendations  None recommended by PT    Recommendations for Other Services       Precautions / Restrictions Precautions Precautions: Fall Precaution Comments: impulsive      Mobility  Bed Mobility Overal bed mobility: Needs Assistance Bed Mobility: Supine to Sit     Supine to sit: Supervision        Transfers Overall transfer level: Needs assistance Equipment used: Rolling walker (2 wheeled) Transfers: Sit to/from Stand Sit to Stand: Min assist         General transfer comment: MIN A due to impulsivity  Ambulation/Gait Ambulation/Gait assistance: Min guard Ambulation Distance (Feet): 12 Feet (x2) Assistive device: Rolling walker (2 wheeled) Gait Pattern/deviations: Trunk flexed;Decreased step length - right;Decreased step length - left     General Gait Details: Pt reluctantly agreeable to ambulate to bathroom and back to bed.  Refused recliner and SCDs.  Stairs            Wheelchair Mobility    Modified Rankin (Stroke Patients Only) Modified Rankin (Stroke Patients Only) Pre-Morbid Rankin Score: Moderate  disability Modified Rankin: Moderately severe disability     Balance Overall balance assessment: Needs assistance;History of Falls Sitting-balance support: Feet supported Sitting balance-Leahy Scale: Good       Standing balance-Leahy Scale: Poor                               Pertinent Vitals/Pain Pain Assessment: No/denies pain Faces Pain Scale: Hurts little more Pain Location: back Pain Descriptors / Indicators: Constant Pain Intervention(s): Limited activity within patient's tolerance    Home Living Family/patient expects to be discharged to:: Private residence Living Arrangements: Children (daughter works 12 hour shifts) Available Help at Discharge: Family;Available PRN/intermittently Type of Home: House Home Access: Stairs to enter   Entrance Stairs-Number of Steps: 3 Home Layout: One level Home Equipment: Walker - 2 wheels;Cane - single point      Prior Function Level of Independence: Independent         Comments: Pt gave above history, but per chart she has been at [facility] for rehab since last hospital admission     Hand Dominance   Dominant Hand: Right    Extremity/Trunk Assessment   Upper Extremity Assessment: Overall WFL for tasks assessed;Generalized weakness           Lower Extremity Assessment: Overall WFL for tasks assessed;Generalized weakness         Communication      Cognition Arousal/Alertness: Awake/alert Behavior During Therapy: Impulsive Overall Cognitive Status: No family/caregiver present to determine baseline cognitive functioning   Orientation Level: Disoriented to;Situation;Place   Memory: Decreased short-term memory  Safety/Judgement: Decreased awareness of safety;Decreased awareness of deficits Awareness: Emergent Problem Solving: Requires verbal cues;Requires tactile cues      General Comments General comments (skin integrity, edema, etc.): Pt c/o feeling cold and shivering.  Appears pale and weak.     Exercises     Assessment/Plan    PT Assessment Patient needs continued PT services  PT Problem List Decreased strength;Decreased balance;Decreased mobility;Decreased activity tolerance          PT Treatment Interventions DME instruction;Gait training;Functional mobility training;Therapeutic exercise;Therapeutic activities;Patient/family education    PT Goals (Current goals can be found in the Care Plan section)  Acute Rehab PT Goals Patient Stated Goal: none stated. "Stop asking me questions." PT Goal Formulation: Patient unable to participate in goal setting Time For Goal Achievement: 12/16/15 Potential to Achieve Goals: Good    Frequency Min 3X/week   Barriers to discharge        Co-evaluation               End of Session Equipment Utilized During Treatment: Gait belt Activity Tolerance: Patient limited by fatigue Patient left: in bed;with call bell/phone within reach;with bed alarm set (pt refused SCDs and OOB to recliner) Nurse Communication: Mobility status         Time: 0263-7858 PT Time Calculation (min) (ACUTE ONLY): 17 min   Charges:   PT Evaluation $PT Eval Moderate Complexity: 1 Procedure     PT G Codes:        Dawn [name] 12/02/2015, 2:00 PM

## 2015-12-02 NOTE — Progress Notes (Signed)
PROGRESS NOTE    EMILLY LAVEY  MWN:[id number] DOB: 02/25/1940 DOA: 11/30/2015 PCP: [name], MD  Brief Narrative: Dawn [name] is a 75 y.o. female with admitted 3 weeks ago for encephalopathy in the setting of possible sepsis and newly diagnosed hyperthyroidism was brought to the ER after family found the patient had a sudden change in mental status. Was found on the floor last evening around 3 PM. Patient was brought as a code stroke in the ER. On the way to the ER EMS staff noticed patient had pinpoint pupils and was given Narcan with some improvement in mental status. CT of the head did not show anything acute. Neurologist was consulted and MRI brain was ordered. Which shows a small punctate infarct.  Assessment & Plan: 1.  Acute encephalopathy -  D/w Neurology : felt that MRI findings of? Punctate infarct  is artifact vs incidental finding -likely related to polypharmacy -holding sedating meds, esp gabapentin -mentation improved -PT/OT/SLP consulted -appreciate Neuro input -FU ECHo and Carotid duplex for completeness -continue ASA -FU LDL and Hba1c  2. Chronic anemia - follow CBC. -no overt bleeding, check anemia panel, last Hb in 7.8 range -may need transfusion  3. Chronic pain - see #1.  4. Hyperthyroidism recently diagnosed - continue Tapazole and metoprolol.  5. Recent SVT on metoprolol.  6. Chronic kidney disease stage III - creatinine appears to be at baseline.  7. COPD - not wheezing continue inhalers.  8. EKG shows T-wave inversion in anterior leads -FU ECHO, troponins negative, no chest pain  DVT prophylaxis: Lovenox. Code Status: Full code.  Family Communication: Discussed with patient, no family at bedside Disposition Plan: Home vs SNF  Consultants:  Neurology  Subjective: Feels so so , no specific complaints  Objective: Vitals:   12/02/15 0500 12/02/15 0800 12/02/15 1017 12/02/15 1024  BP: 104/73 110/69  (!) 111/50  Pulse: 78 78  89 83  Resp: '18 16 16 20  '$ Temp: 98.9 F (37.2 C) 98.4 F (36.9 C)  98.2 F (36.8 C)  TempSrc: Oral Oral  Oral  SpO2: 100% 100% 92% 100%  Weight:      Height:        Intake/Output Summary (Last 24 hours) at 12/02/15 1503 Last data filed at 12/01/15 1700  Gross per 24 hour  Intake              300 ml  Output                0 ml  Net              300 ml   Filed Weights   11/30/15 1940 11/30/15 2000  Weight: 59.4 kg (130 lb 15.3 oz) 54.9 kg (121 lb 0.5 oz)    Examination:  General exam: Appears calm and comfortable, chronically ill appearing Respiratory system: Clear to auscultation. Respiratory effort normal. Cardiovascular system: S1 & S2 heard, RRR. No JVD, murmurs, rubs, gallops or clicks. No pedal edema. Gastrointestinal system: Abdomen is nondistended, soft and nontender. No organomegaly or masses felt. Normal bowel sounds heard. Central nervous system: Alert and oriented. No focal neurological deficits. Extremities: Symmetric 5 x 5 power. Skin: No rashes, lesions or ulcers Psychiatry: Judgement and insight appear normal. Mood & affect appropriate.     Data Reviewed: I have personally reviewed following labs and imaging studies  CBC:  Recent Labs Lab 11/30/15 1850 11/30/15 1856 12/01/15 0047 12/01/15 1215 12/02/15 0459  WBC 6.7  --  4.6 3.6*  4.3  NEUTROABS 4.5  --   --  2.6 2.9  HGB 8.4* 9.2* 7.8* 8.9* 7.6*  HCT 27.9* 27.0* 26.0* 29.4* 25.4*  MCV 94.6  --  94.9 95.5 93.4  PLT 227  --  174 182 536   Basic Metabolic Panel:  Recent Labs Lab 11/30/15 1850 11/30/15 1856 12/01/15 0047 12/02/15 0459  NA 135 136  --  137  K 4.7 4.7  --  3.8  CL 101 98*  --  103  CO2 28  --   --  27  GLUCOSE 92 91  --  94  BUN 16 19  --  8  CREATININE 1.16* 1.20* 1.12* 0.96  CALCIUM 8.3*  --   --  8.0*  MG  --   --   --  1.8   GFR: Estimated Creatinine Clearance: 43.2 mL/min (by C-G formula based on SCr of 0.96 mg/dL). Liver Function Tests:  Recent Labs Lab  11/30/15 1850 12/02/15 0459  AST 11* 8*  ALT 9* 6*  ALKPHOS 68 56  BILITOT 0.3 0.3  PROT 5.7* 5.2*  ALBUMIN 2.6* 2.2*   No results for input(s): LIPASE, AMYLASE in the last 168 hours.  Recent Labs Lab 12/01/15 0047  AMMONIA 11   Coagulation Profile:  Recent Labs Lab 11/30/15 1850  INR 1.23   Cardiac Enzymes:  Recent Labs Lab 12/01/15 0639 12/01/15 1215 12/01/15 2012  TROPONINI <0.03 <0.03 <0.03   BNP (last 3 results) No results for input(s): PROBNP in the last 8760 hours. HbA1C:  Recent Labs  12/01/15 0644  HGBA1C 5.2   CBG:  Recent Labs Lab 11/30/15 1911  GLUCAP 79   Lipid Profile:  Recent Labs  12/01/15 0644  CHOL 107  HDL 27*  LDLCALC 59  TRIG 107  CHOLHDL 4.0   Thyroid Function Tests:  Recent Labs  12/01/15 1201 12/01/15 1215  TSH 0.034*  --   FREET4  --  1.44*   Anemia Panel:  Recent Labs  12/01/15 1215 12/02/15 1155  VITAMINB12  --  559  FOLATE  --  18.0  FERRITIN  --  249  TIBC  --  179*  IRON  --  11*  RETICCTPCT 4.9* 4.8*   Urine analysis:    Component Value Date/Time   COLORURINE YELLOW 11/30/2015 [place] 11/30/2015 1243   LABSPEC 1.012 11/30/2015 1243   PHURINE 6.5 11/30/2015 1243   GLUCOSEU NEGATIVE 11/30/2015 1243   HGBUR NEGATIVE 11/30/2015 1243   BILIRUBINUR NEGATIVE 11/30/2015 1243   KETONESUR NEGATIVE 11/30/2015 1243   PROTEINUR NEGATIVE 11/30/2015 1243   UROBILINOGEN 0.2 04/29/2008 2235   NITRITE NEGATIVE 11/30/2015 1243   LEUKOCYTESUR NEGATIVE 11/30/2015 1243   Sepsis Labs: '@LABRCNTIP'$ (procalcitonin:4,lacticidven:4)  ) Recent Results (from the past 240 hour(s))  MRSA PCR Screening     Status: None   Collection Time: 12/01/15  5:08 AM  Result Value Ref Range Status   MRSA by PCR NEGATIVE NEGATIVE Final    Comment:        The GeneXpert MRSA Assay (FDA approved for NASAL specimens only), is one component of a comprehensive MRSA colonization surveillance program. It is  not intended to diagnose MRSA infection nor to guide or monitor treatment for MRSA infections.   C difficile quick scan w PCR reflex     Status: None   Collection Time: 12/02/15  6:19 AM  Result Value Ref Range Status   C Diff antigen NEGATIVE NEGATIVE Final   C Diff toxin NEGATIVE  NEGATIVE Final   C Diff interpretation No C. difficile detected.  Final         Radiology Studies: Dg Chest 1 View  Result Date: 11/30/2015 CLINICAL DATA:  Status post fall, with concern for chest injury. Initial encounter. EXAM: CHEST 1 VIEW COMPARISON:  Chest radiograph performed 11/10/2015, and CT of the chest performed 11/16/2015 FINDINGS: The lungs are hyperexpanded, with flattening of the hemidiaphragms, compatible with COPD. Mild bilateral atelectasis is seen. There is no evidence of pleural effusion or pneumothorax. The cardiomediastinal silhouette is enlarged. No acute osseous abnormalities are seen. Chronic right-sided rib deformities are noted. IMPRESSION: 1. No displaced rib fracture seen. 2. Findings of COPD, with mild bilateral atelectasis. 3. Cardiomegaly. Electronically Signed   By: Garald Balding M.D.   On: 11/30/2015 21:25   Dg Lumbar Spine Complete  Result Date: 11/30/2015 CLINICAL DATA:  Status post fall, with lower back pain. Initial encounter. EXAM: LUMBAR SPINE - COMPLETE 4+ VIEW COMPARISON:  CT of the abdomen and pelvis performed 11/17/2015 FINDINGS: There is no evidence of fracture or subluxation. Vertebral bodies demonstrate normal height. Mild left convex thoracolumbar scoliosis is noted. Intervertebral disc spaces are preserved. Facet disease is noted along the lumbar spine. The visualized bowel gas pattern is unremarkable in appearance; air and stool are noted within the colon. The sacroiliac joints are within normal limits. Diffuse calcification is seen along the abdominal aorta acute IMPRESSION: 1. No evidence of fracture or subluxation along the lumbar spine. 2. Mild left convex  thoracolumbar scoliosis, with mild degenerative change. 3. Diffuse aortic atherosclerosis. Electronically Signed   By: Garald Balding M.D.   On: 11/30/2015 21:23   Ct Cervical Spine Wo Contrast  Result Date: 11/30/2015 CLINICAL DATA:  Found unresponsive on floor at rehabilitation center. Weakness. History of spinal stenosis. EXAM: CT CERVICAL SPINE WITHOUT CONTRAST TECHNIQUE: Multidetector CT imaging of the cervical spine was performed without intravenous contrast. Multiplanar CT image reconstructions were also generated. COMPARISON:  None. FINDINGS: ALIGNMENT: Broad reversed lordosis. Minimal grade 1 C3-4 anterolisthesis off spondylolysis. SKULL BASE AND VERTEBRAE: Cervical vertebral bodies and posterior elements are intact. Severe C4-5, C5-6 disc height loss, moderate to severe at C3-4 and C6-7 associated with uncovertebral hypertrophy, subchondral cysts and endplate spurring compatible with degenerative discs. RIGHT C2-3 facets are fused on degenerative basis. Multilevel moderate to severe facet arthropathy. No destructive bony lesions. C1-2 articulation maintained. Calcified craniocervical ligaments. SOFT TISSUES AND SPINAL CANAL: Soft tissues are nonacute. Superficial 23 x 8 mm probable sebaceous cyst C3-4 paraspinal soft tissues. Mild calcific atherosclerosis of the carotid bifurcations. DISC LEVELS: No significant osseous canal stenosis. Severe LEFT C3-4, moderate to severe bilateral C4-5, severe bilateral C5-6 and moderate to severe LEFT C6-7 neural foraminal narrowing. UPPER CHEST: Lung apices are clear. OTHER: None. IMPRESSION: No acute fracture. Minimal C3-4 anterolisthesis on degenerative basis. Multilevel severe and moderate to severe neural foraminal narrowing. Electronically Signed   By: Elon Alas M.D.   On: 11/30/2015 20:38   Mr Jodene Nam Head Wo Contrast  Result Date: 11/30/2015 CLINICAL DATA:  Altered mental status after fall. EXAM: MRI HEAD WITHOUT CONTRAST MRA HEAD WITHOUT CONTRAST  TECHNIQUE: Multiplanar, multiecho pulse sequences of the brain and surrounding structures were obtained without intravenous contrast. Angiographic images of the head were obtained using MRA technique without contrast. COMPARISON:  Head CT same day FINDINGS: MRI HEAD FINDINGS Brain: There is a punctate focus of diffusion restriction within the right frontal white matter at the base of the precentral gyrus. No other evidence  of ischemia. No acute hemorrhage. The midline structures are normal. There is beginning confluent hyperintense T2-weighted signal within the periventricular and deep white matter, most often seen in the setting of chronic microvascular ischemia. No mass lesion or midline shift. No hydrocephalus or extra-axial fluid collection. No age advanced or lobar predominant atrophy. Vascular: Major intracranial arterial and venous sinus flow voids are preserved. No evidence of chronic microhemorrhage or amyloid angiopathy. Skull and upper cervical spine: The visualized skull base, calvarium, upper cervical spine and extracranial soft tissues are normal. Sinuses/Orbits: Left maxillary retention cyst. Normal orbits. MRA HEAD FINDINGS Intracranial internal carotid arteries: Normal. Anterior cerebral arteries: Normal. Middle cerebral arteries: Normal. Posterior communicating arteries: Present on the right. Posterior cerebral arteries: Normal. Basilar artery: Normal. Vertebral arteries: Left dominant. Normal. Superior cerebellar arteries: Normal. Anterior inferior cerebellar arteries: Normal. Posterior inferior cerebellar arteries: Normal on the left. Not identified on the right. Variable MRA appearance of the posterior inferior cerebellar arteries is not uncommon. IMPRESSION: 1. Punctate focus of acute ischemia within the right frontal white matter at the base of the precentral gyrus. No hemorrhage or mass effect. 2. Chronic microvascular ischemia. 3. Normal MRA of the circle of Willis and intracranial arteries.  Electronically Signed   By: Ulyses Jarred M.D.   On: 11/30/2015 23:31   Mr Brain Wo Contrast  Result Date: 11/30/2015 CLINICAL DATA:  Altered mental status after fall. EXAM: MRI HEAD WITHOUT CONTRAST MRA HEAD WITHOUT CONTRAST TECHNIQUE: Multiplanar, multiecho pulse sequences of the brain and surrounding structures were obtained without intravenous contrast. Angiographic images of the head were obtained using MRA technique without contrast. COMPARISON:  Head CT same day FINDINGS: MRI HEAD FINDINGS Brain: There is a punctate focus of diffusion restriction within the right frontal white matter at the base of the precentral gyrus. No other evidence of ischemia. No acute hemorrhage. The midline structures are normal. There is beginning confluent hyperintense T2-weighted signal within the periventricular and deep white matter, most often seen in the setting of chronic microvascular ischemia. No mass lesion or midline shift. No hydrocephalus or extra-axial fluid collection. No age advanced or lobar predominant atrophy. Vascular: Major intracranial arterial and venous sinus flow voids are preserved. No evidence of chronic microhemorrhage or amyloid angiopathy. Skull and upper cervical spine: The visualized skull base, calvarium, upper cervical spine and extracranial soft tissues are normal. Sinuses/Orbits: Left maxillary retention cyst. Normal orbits. MRA HEAD FINDINGS Intracranial internal carotid arteries: Normal. Anterior cerebral arteries: Normal. Middle cerebral arteries: Normal. Posterior communicating arteries: Present on the right. Posterior cerebral arteries: Normal. Basilar artery: Normal. Vertebral arteries: Left dominant. Normal. Superior cerebellar arteries: Normal. Anterior inferior cerebellar arteries: Normal. Posterior inferior cerebellar arteries: Normal on the left. Not identified on the right. Variable MRA appearance of the posterior inferior cerebellar arteries is not uncommon. IMPRESSION: 1.  Punctate focus of acute ischemia within the right frontal white matter at the base of the precentral gyrus. No hemorrhage or mass effect. 2. Chronic microvascular ischemia. 3. Normal MRA of the circle of Willis and intracranial arteries. Electronically Signed   By: Ulyses Jarred M.D.   On: 11/30/2015 23:31   Dg Hips Bilat W Or Wo Pelvis 3-4 Views  Result Date: 11/30/2015 CLINICAL DATA:  Status post fall. Concern for pelvic injury. Initial encounter. EXAM: DG HIP (WITH OR WITHOUT PELVIS) 3-4V BILAT COMPARISON:  None. FINDINGS: There is no evidence of fracture or dislocation. Both femoral heads are seated normally within their respective acetabula. The proximal femurs appear intact bilaterally. No significant  degenerative change is appreciated. The sacroiliac joints are unremarkable in appearance. The visualized bowel gas pattern is grossly unremarkable in appearance. IMPRESSION: No evidence of fracture or dislocation. Electronically Signed   By: Garald Balding M.D.   On: 11/30/2015 21:21   Ct Head Code Stroke W/o Cm  Result Date: 11/30/2015 CLINICAL DATA:  Code stroke. LEFT-sided weakness and slurred speech. Found on floor unresponsive . Last seen normal at 1500 hours. History of urinary tract infection. EXAM: CT HEAD WITHOUT CONTRAST TECHNIQUE: Contiguous axial images were obtained from the base of the skull through the vertex without intravenous contrast. COMPARISON:  CT HEAD November 10, 2015 FINDINGS: BRAIN: The ventricles and sulci are normal for age. No intraparenchymal hemorrhage, mass effect nor midline shift. Patchy supratentorial white matter hypodensities within normal range for patient's age, though non-specific are most compatible with chronic small vessel ischemic disease. No acute large vascular territory infarcts. No abnormal extra-axial fluid collections. Basal cisterns are patent. VASCULAR: Mild to moderate calcific atherosclerosis of the carotid siphons. SKULL: No skull fracture. Moderate  RIGHT temporomandibular osteoarthrosis. No significant scalp soft tissue swelling. SINUSES/ORBITS: Small LEFT maxillary sinus air-fluid level with frothy secretions, improved. Mild paranasal sinus mucosal thickening. Mastoid air cells are well aerated. Soft tissue within the external auditory canals compatible with cerumen. The included ocular globes and orbital contents are non-suspicious. 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 ASPECTS [facility] Stroke Program Early CT Score) - Ganglionic level infarction (caudate, lentiform nuclei, internal capsule, insula, M1-M3 cortex): 7 - Supraganglionic infarction (M4-M6 cortex): 3 Total score (0-10 with 10 being normal): 10 IMPRESSION: 1. No acute intracranial process ; negative CT HEAD for age. 2. ASPECTS is 10. Critical Value/emergent results were called by telephone at the time of interpretation on 11/30/2015 at 7:21 pm to Dr. Leonel Ramsay, Neurology, who verbally acknowledged these results. Electronically Signed   By: Elon Alas M.D.   On: 11/30/2015 19:22        Scheduled Meds: . aspirin EC  325 mg Oral Daily  . buPROPion  150 mg Oral Daily  . Ferrous Fumarate  106 mg of iron Oral Daily  . loperamide  2 mg Oral BID  . methimazole  10 mg Oral BID  . umeclidinium bromide  1 puff Inhalation Daily   Continuous Infusions:   LOS: 1 day    Time spent: 13mn    PDomenic Polite MD [facility] Pager [phone] If 7PM-7AM, please contact night-coverage [email or web address] Password [email or web address] [place]11/22/2017, 3:03 PM

## 2015-12-02 NOTE — NC FL2 (Signed)
[place] LEVEL OF CARE SCREENING TOOL     IDENTIFICATION  Patient Name: Dawn [name] Birthdate: 01/24/1940 Sex: female Admission Date (Current Location): 11/30/2015  [facility] and [place] Number:  Herbalist and Address:  [facility] [place], [place] [address] [place], [place] [address]      Provider Number: [id number]  Attending Physician Name and Address:  Domenic Polite, MD  Relative Name and Phone Number:       Current Level of Care: Hospital Recommended Level of Care: [place] Prior Approval Number:    Date Approved/Denied:   PASRR Number: [id number]  Discharge Plan: Home    Current Diagnoses: Patient Active Problem List   Diagnosis Date Noted  . Alteration consciousness 11/30/2015  . SVT (supraventricular tachycardia) ([place]) 11/27/2015  . Diarrhea 11/27/2015  . Depression 11/27/2015  . 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 Renal insufficiency   . Protein-calorie malnutrition, severe 11/18/2015  . Intra-abdominal fluid collection   . Premature atrial contractions   . Functional diarrhea   . Sepsis ([place])   . Hyperthyroidism   . Hypotension   . Septic shock ([place])   . Acute encephalopathy   . Hypokalemia   . Acute delirium 11/10/2015  . Acute kidney injury ([place]) 11/10/2015  . Normocytic anemia 11/10/2015  . Anxiety and depression 11/10/2015  . Chronic pain syndrome 11/10/2015  . Recurrent UTI 11/10/2015  . Acute respiratory failure with hypoxia ([place]) 11/10/2015  . COPD (chronic obstructive pulmonary disease) ([place]) 11/10/2015  . Acute hyperglycemia 11/10/2015  . CKD (chronic kidney disease) stage 3, GFR 30-59 ml/min 11/10/2015  . Narrow complex tachycardia ([place]) 11/10/2015  . HTN (hypertension) 11/10/2015  . Generalized weakness 11/10/2015  . Anemia   . Delirium   . Hypoxemia   . Tachycardia     Orientation RESPIRATION BLADDER Height & Weight     Self, Time, Situation, Place  O2 (2L)  Continent Weight:  (61.9 kg; bed not zeroed properly) Height:  '5\' 6"'$  (167.6 cm) (per pt)  BEHAVIORAL SYMPTOMS/MOOD NEUROLOGICAL BOWEL NUTRITION STATUS      Continent Diet (Heart Healthy/Thin Liquids)  AMBULATORY STATUS COMMUNICATION OF NEEDS Skin   Limited Assist Verbally Normal                       Personal Care Assistance Level of Assistance  Bathing, Feeding, Dressing Bathing Assistance: Limited assistance Feeding assistance: Independent Dressing Assistance: Limited assistance     Functional Limitations Info  Sight, Hearing, Speech Sight Info: Adequate Hearing Info: Adequate Speech Info: Adequate    SPECIAL CARE FACTORS FREQUENCY  PT (By licensed PT), Speech therapy, OT (By licensed OT)     PT Frequency: 5 OT Frequency: 5     Speech Therapy Frequency: 5      Contractures Contractures Info: Not present    Additional Factors Info  Code Status, Allergies, Psychotropic Code Status Info: Full Code Allergies Info: No known Allergies Psychotropic Info: Medications         Current Medications (12/02/2015):  This is the current hospital active medication list Current Facility-Administered Medications  Medication Dose Route Frequency Provider Last Rate Last Dose  . acetaminophen (TYLENOL) tablet 650 mg  650 mg Oral Q6H PRN Lavina Hamman, MD   650 mg at 12/01/15 1359  . ALPRAZolam Duanne Moron) tablet 0.25 mg  0.25 mg Oral BID PRN Lavina Hamman, MD      . aspirin EC tablet 325 mg  325 mg  Oral Daily Lavina Hamman, MD   325 mg at 12/02/15 1043  . buPROPion (WELLBUTRIN XL) 24 hr tablet 150 mg  150 mg Oral Daily Lavina Hamman, MD   150 mg at 12/02/15 1043  . Ferrous Fumarate (HEMOCYTE - 106 mg FE) tablet 106 mg of iron  106 mg of iron Oral Daily [name], MD   106 mg of iron at 12/02/15 1044  . loperamide (IMODIUM) capsule 2 mg  2 mg Oral BID Domenic Polite, MD   2 mg at 12/02/15 1127  . methimazole (TAPAZOLE) tablet 10 mg  10 mg Oral BID [name], MD    10 mg at 12/02/15 1043  . umeclidinium bromide (INCRUSE ELLIPTA) 62.5 MCG/INH 1 puff  1 puff Inhalation Daily [name], MD   1 puff at 12/02/15 1016     Discharge Medications: Please see discharge summary for a list of discharge medications.  Relevant Imaging Results:  Relevant Lab Results:   Additional Information SSN:  [id number]  Darden Dates, LCSW

## 2015-12-02 NOTE — Progress Notes (Signed)
  Echocardiogram 2D Echocardiogram has been performed.  Dawn [name] 12/02/2015, 3:28 PM

## 2015-12-02 NOTE — Progress Notes (Signed)
PT Cancellation Note  Patient Details Name: ONDREA DOW MRN: [id number] DOB: 12-22-1940   Cancelled Treatment:    Reason Eval/Treat Not Completed: Patient declined citing up all night with diarrhea and needing to rest.  Offered limited PT eval, and pt still declined.  Will check back later as schedule permits.   Shakeira Rhee LUBECK 12/02/2015, 9:42 AM

## 2015-12-02 NOTE — Evaluation (Signed)
Speech Language Pathology Evaluation Patient Details Name: Dawn [name] MRN: [id number] DOB: 1940/10/07 Today's Date: 12/02/2015 Time: 2956-2130 SLP Time Calculation (min) (ACUTE ONLY): 14 min  Problem List:  Patient Active Problem List   Diagnosis Date Noted  . Alteration consciousness 11/30/2015  . SVT (supraventricular tachycardia) ([place]) 11/27/2015  . Diarrhea 11/27/2015  . Depression 11/27/2015  . Polyneuropathy ([place]) 11/27/2015  . Renal insufficiency   . Protein-calorie malnutrition, severe 11/18/2015  . Intra-abdominal fluid collection   . Premature atrial contractions   . Functional diarrhea   . Sepsis ([place])   . Hyperthyroidism   . Hypotension   . Septic shock ([place])   . Acute encephalopathy   . Hypokalemia   . Acute delirium 11/10/2015  . Acute kidney injury ([place]) 11/10/2015  . Normocytic anemia 11/10/2015  . Anxiety and depression 11/10/2015  . Chronic pain syndrome 11/10/2015  . Recurrent UTI 11/10/2015  . Acute respiratory failure with hypoxia ([place]) 11/10/2015  . COPD (chronic obstructive pulmonary disease) ([place]) 11/10/2015  . Acute hyperglycemia 11/10/2015  . CKD (chronic kidney disease) stage 3, GFR 30-59 ml/min 11/10/2015  . Narrow complex tachycardia ([place]) 11/10/2015  . HTN (hypertension) 11/10/2015  . Generalized weakness 11/10/2015  . Anemia   . Delirium   . Hypoxemia   . Tachycardia    Past Medical History:  Past Medical History:  Diagnosis Date  . COPD (chronic obstructive pulmonary disease) ([place])   . History of anxiety   . History of depression   . Spinal stenosis   . UTI (urinary tract infection)    Past Surgical History:  Past Surgical History:  Procedure Laterality Date  . FLEXIBLE SIGMOIDOSCOPY Left 11/18/2015   Procedure: FLEXIBLE SIGMOIDOSCOPY;  Surgeon: [name], MD;  Location: [place];  Service: Endoscopy;  Laterality: Left;  . FLEXIBLE SIGMOIDOSCOPY N/A 11/20/2015   Procedure: FLEXIBLE SIGMOIDOSCOPY;  Surgeon: [name], MD;  Location: [facility] ENDOSCOPY;  Service: Endoscopy;  Laterality: N/A;  . NO PAST SURGERIES     HPI:  Dawn Duffey Haithcockis a 75 y.o.femalewith PMH: COPD, anxiety/dpression, UTI, spinal stenosis with recent admission 3 weeks ago for encephalopathy in the setting of possible sepsis and newly diagnosed hyperthyroidism. This admission with AMS. MRI showed a punctate focus of acute ischemia within the right frontal white matter at the base of the precentral gyrus. No hemorrhage or mass effect, chronic microvascular ischemia.   Assessment / Plan / Recommendation Clinical Impression  Pt's baseline status unknwown. She exhibited difficulty on various subtests of MOCA and Cognistat including verbal problem solving, divergent naming and demonstrated left inattention. Pt answers questions without initiating much verbal output. ST will intervene and attempt to determine baseline cognitive status and facilitate baseline independence.     SLP Assessment  Patient needs continued Speech Lanaguage Pathology Services    Follow Up Recommendations   (TBD)    Frequency and Duration min 2x/week  2 weeks      SLP Evaluation Cognition  Overall Cognitive Status: Difficult to assess (baseline not known) Arousal/Alertness: Awake/alert Orientation Level: Oriented to person;Oriented to place;Oriented to time Attention: Sustained Sustained Attention: Impaired Sustained Attention Impairment: Verbal basic Memory:  (will assess further) Awareness: Impaired Awareness Impairment: Intellectual impairment;Emergent impairment;Anticipatory impairment Problem Solving: Impaired Problem Solving Impairment: Verbal basic Safety/Judgment: Impaired       Comprehension  Auditory Comprehension Overall Auditory Comprehension: Appears within functional limits for tasks assessed Visual Recognition/Discrimination Discrimination: Not tested Reading Comprehension Reading Status:  (TBA)    Expression  Expression Primary Mode of  Expression: Verbal Verbal Expression Overall Verbal Expression: Appears within functional limits for tasks assessed (question if difficulty for longer utterances) Initiation: No impairment Repetition:  (NT) Naming: Not tested Pragmatics: Impairment Impairments: Eye contact Written Expression Dominant Hand: Right Written Expression:  (TBA)   Oral / Motor  Oral Motor/Sensory Function Overall Oral Motor/Sensory Function: Within functional limits Motor Speech Overall Motor Speech: Appears within functional limits for tasks assessed Respiration: Within functional limits Phonation: Normal Resonance: Within functional limits Articulation: Within functional limitis Intelligibility: Intelligible Motor Planning: Witnin functional limits   Dawn [name]                    Dawn [name] 12/02/2015, 11:56 AM  Orbie Pyo Colvin Caroli.[employer] [phone]

## 2015-12-02 NOTE — Progress Notes (Addendum)
On call notified [phone] that patient having PVCs, HR jumping to 140s although not sustaining, HR irregular. Pt remains asymptomatic, resting in bed HR typically ~80. No orders at this time. Will continue to monitor.   Orders placed 0101 for magnesium draw in AM. Continue to monitor.

## 2015-12-03 ENCOUNTER — Inpatient Hospital Stay (HOSPITAL_COMMUNITY): Payer: Medicare Other

## 2015-12-03 LAB — BASIC METABOLIC PANEL
ANION GAP: 6 (ref 5–15)
BUN: 8 mg/dL (ref 4–21)
BUN: 8 mg/dL (ref 6–20)
CALCIUM: 8.1 mg/dL — AB (ref 8.9–10.3)
CHLORIDE: 102 mmol/L (ref 101–111)
CO2: 28 mmol/L (ref 22–32)
CREATININE: 1 mg/dL (ref 0.5–1.1)
Creatinine, Ser: 0.96 mg/dL (ref 0.44–1.00)
GFR calc non Af Amer: 56 mL/min — ABNORMAL LOW (ref >60)
GLUCOSE: 93 mg/dL
Glucose, Bld: 93 mg/dL (ref 65–99)
POTASSIUM: 3.5 mmol/L (ref 3.4–5.3)
Potassium: 3.5 mmol/L (ref 3.5–5.1)
SODIUM: 136 mmol/L — AB (ref 137–147)
SODIUM: 136 mmol/L (ref 135–145)

## 2015-12-03 LAB — CBC
HCT: 26.3 % — ABNORMAL LOW (ref 36.0–46.0)
HEMOGLOBIN: 8.1 g/dL — AB (ref 12.0–15.0)
MCH: 28.6 pg (ref 26.0–34.0)
MCHC: 30.8 g/dL (ref 30.0–36.0)
MCV: 92.9 fL (ref 78.0–100.0)
Platelets: 189 10*3/uL (ref 150–400)
RBC: 2.83 MIL/uL — AB (ref 3.87–5.11)
RDW: 17.5 % — ABNORMAL HIGH (ref 11.5–15.5)
WBC: 6.5 10*3/uL (ref 4.0–10.5)

## 2015-12-03 LAB — CBC AND DIFFERENTIAL
HCT: 26 % — AB (ref 36–46)
HEMOGLOBIN: 8.1 g/dL — AB (ref 12.0–16.0)
Platelets: 189 10*3/uL (ref 150–399)
WBC: 6.5 10*3/mL

## 2015-12-03 MED ORDER — METHIMAZOLE 5 MG PO TABS
15.0000 mg | ORAL_TABLET | Freq: Two times a day (BID) | ORAL | Status: DC
  Administered 2015-12-03 – 2015-12-04 (×2): 15 mg via ORAL
  Filled 2015-12-03 (×2): qty 1

## 2015-12-03 MED ORDER — LOPERAMIDE HCL 2 MG PO CAPS: 2.0000 mg | ORAL_CAPSULE | Freq: Two times a day (BID) | ORAL | 0 refills | Status: DC

## 2015-12-03 MED ORDER — ALPRAZOLAM 0.5 MG PO TABS: 0.5000 mg | ORAL_TABLET | Freq: Two times a day (BID) | ORAL | 0 refills | Status: DC | PRN

## 2015-12-03 MED ORDER — OXYCODONE HCL 5 MG PO TABS
5.0000 mg | ORAL_TABLET | Freq: Four times a day (QID) | ORAL | Status: DC | PRN
  Administered 2015-12-03 – 2015-12-04 (×3): 5 mg via ORAL
  Filled 2015-12-03 (×4): qty 1

## 2015-12-03 MED ORDER — OXYCODONE HCL 5 MG PO TABS: 5.0000 mg | ORAL_TABLET | Freq: Four times a day (QID) | ORAL | 0 refills | Status: DC | PRN

## 2015-12-03 MED ORDER — METHIMAZOLE 10 MG PO TABS: 15.0000 mg | ORAL_TABLET | Freq: Two times a day (BID) | ORAL | 0 refills | Status: DC

## 2015-12-03 MED ORDER — SODIUM CHLORIDE 0.9 % IV SOLN
25.0000 mg | Freq: Once | INTRAVENOUS | Status: AC
  Administered 2015-12-03: 25 mg via INTRAVENOUS
  Filled 2015-12-03: qty 0.5

## 2015-12-03 MED ORDER — SODIUM CHLORIDE 0.9 % IV SOLN
250.0000 mg | Freq: Once | INTRAVENOUS | Status: AC
  Administered 2015-12-03: 250 mg via INTRAVENOUS
  Filled 2015-12-03 (×2): qty 5

## 2015-12-03 MED ORDER — FERROCITE 324 MG PO TABS: 106.0000 mg | ORAL_TABLET | Freq: Two times a day (BID) | ORAL | 2 refills | Status: DC

## 2015-12-03 NOTE — Progress Notes (Signed)
Patient's temp 100.4 orally. MD notified. MD requested rectal temp. Patient refused rectal temp. MD notified. Will recheck temp and notify MD at 1740.

## 2015-12-03 NOTE — Clinical Social Work Note (Signed)
MSW received call from MD in regards to patient returning to [facility] today, 11/23. MSW has contacted facility and left a message with admissions director for a returned phone call.   FL2 completed and faxed via [place]. MSW remains available as needed.   Glendon Axe, MSW [phone] 12/03/2015 10:52 AM

## 2015-12-03 NOTE — Clinical Social Work Note (Addendum)
MSW has called [facility] at 11:17AM, facility representative reported that she would need to contact admissions director, Lexine Baton. MSW has made third attempt to contact admissions director, Lexine Baton at 12:06PM and left a message for a returned phone call in regards to patient returning.   MSW has also spoken with patient's daughter, [name] who has been notified of discharge. Patient's dtr is agreeable to patient's return however was unaware that patient would dc today, 11/23. Patient's dtr agreeable to dc today as long as patient returns to [facility]. Patient's dtr aware of possibly for Friday, 11/24 discharge.   MSW to inform on call CSW at 12:30PM. MD aware.   DC summary and SNF transport report faxed via [place].   Glendon Axe, MSW [phone] 12/03/2015 12:15 PM

## 2015-12-03 NOTE — Progress Notes (Signed)
Patient's temp remains 100.3 orally. MD notified. Per MD, d/c discharge for tonight. Patient's daughter, [name], notified, and [facility] notified.

## 2015-12-03 NOTE — Discharge Summary (Addendum)
Physician Discharge Summary  Dawn [name] ITG:[id number] DOB: Jun 01, 1940 DOA: 11/30/2015  PCP: [name], MD  Admit date: 11/30/2015 Discharge date: 12/04/2015  Time spent: 45 minutes  Recommendations for Outpatient Follow-up:  1. PCP Dr.Reese in 1 week, please monitor CBC/Hb 2. Endocrinology Dr.Kumar in 2 weeks for Hyperthyroidism   Discharge Diagnoses:  Principal Problem:   Acute encephalopathy   Polypharmacy   Severe Iron deficiency anemia   Anxiety and depression   Chronic pain syndrome   COPD (chronic obstructive pulmonary disease) (HCC)   CKD (chronic kidney disease) stage 3, GFR 30-59 ml/min   HTN (hypertension)   Hyperthyroidism   Protein-calorie malnutrition, severe   Depression   Discharge Condition:stable  Diet recommendation: low sodium, heart healthy  Filed Weights   11/30/15 1940 11/30/15 2000  Weight: 59.4 kg (130 lb 15.3 oz) 54.9 kg (121 lb 0.5 oz)    History of present illness:  Dawn [name] a 75 y.o.femalewith admitted 3 weeks ago for encephalopathy in the setting of possible sepsis and newly diagnosed hyperthyroidism was brought to the ER after family found the patient had a sudden change in mental status. Was found on the floor last evening around 3 PM. Patient was brought as a code stroke in the ER. On the way to the ER EMS staff noticed patient had pinpoint pupils and was given Narcan with some improvement in mental status. CT of the head did not show anything acute.   Hospital Course:  1.  Acute encephalopathy- D/w Neurology : felt that MRI findings of? Punctate infarct  is artifact vs incidental finding -symptoms felt to be related to polypharmacy ( high dose gabapentin/xanax/oxycodone etc -stopped gabapentin, incidentally patient had requested this to be stopped last admission due to somnolence. -mentation improved, xanax and oxycodone dose also decreased -PT/OT/SLP consulted, SNF recommended -2D ECHo shows normal EF and  grade 2DD and Carotid duplex-unremarkable -continue ASA '325mg'$  daily for secondary stroke prevention - LDL-59 and Hba1c was 5.2 -discharged back to Rehab on lower dose of sedating meds  2. Chronic anemia- follow CBC. -no overt bleeding, Hb in 7.6-8 range and today is 8.1 -anemia panel with severe Iron defi, given IV Iron in hospital -increased PO Iron at discharge and she is uptodate on colonoscopy   3. Chronic pain- see #1.  4. Hyperthyroidismrecently diagnosed - continue Tapazole and metoprolol. -increased tapazole dose to '15mg'$  BID from '10mg'$  BID, since Free T4 still high and with diarrhea, monitor CBC while on this -FU with Dr.Kumar as previously scheduled  5. Recent SVT-stable, on metoprolol.  6. Chronic kidney disease stage III - creatinine appears to be at baseline.  7. COPD - not wheezing continue inhalers.   Consultations:  Neurology  Discharge Exam: Vitals:   12/03/15 0953 12/03/15 1037  BP:  100/62  Pulse: 78 70  Resp: 18 20  Temp:  97.8 F (36.6 C)    General: AAOx3 Cardiovascular: S1S2/RRR Respiratory: CTAB  Discharge Instructions   Discharge Instructions    Diet - low sodium heart healthy    Complete by:  As directed    Increase activity slowly    Complete by:  As directed      Current Discharge Medication List    START taking these medications   Details  loperamide (IMODIUM) 2 MG capsule Take 1 capsule (2 mg total) by mouth 2 (two) times daily. Qty: 30 capsule, Refills: 0      CONTINUE these medications which have CHANGED   Details  ALPRAZolam Duanne Moron)  0.5 MG tablet Take 1 tablet (0.5 mg total) by mouth 2 (two) times daily as needed for anxiety. Qty: 20 tablet, Refills: 0    FERROCITE 324 MG TABS tablet Take 1 tablet (106 mg of iron total) by mouth 2 (two) times daily after a meal. Qty: 30 tablet, Refills: 2    methimazole (TAPAZOLE) 10 MG tablet Take 1.5 tablets (15 mg total) by mouth 2 (two) times daily. Refills: 0     oxyCODONE (OXY IR/ROXICODONE) 5 MG immediate release tablet Take 1 tablet (5 mg total) by mouth every 6 (six) hours as needed for moderate pain. Qty: 30 tablet, Refills: 0      CONTINUE these medications which have NOT CHANGED   Details  buPROPion (WELLBUTRIN XL) 150 MG 24 hr tablet Take 150 mg by mouth daily.    INCRUSE ELLIPTA 62.5 MCG/INH AEPB Inhale 1 puff into the lungs daily. Refills: 3    metoprolol tartrate (LOPRESSOR) 25 MG tablet Take 1 tablet (25 mg total) by mouth 2 (two) times daily. Qty: 60 tablet, Refills: 0    traZODone (DESYREL) 100 MG tablet Take 100 mg by mouth at bedtime.      STOP taking these medications     gabapentin (NEURONTIN) 600 MG tablet        No Known Allergies    The results of significant diagnostics from this hospitalization (including imaging, microbiology, ancillary and laboratory) are listed below for reference.    Significant Diagnostic Studies: Ct Abdomen Pelvis Wo Contrast  Result Date: 11/17/2015 CLINICAL DATA:  Possible diverticular abscess EXAM: CT ABDOMEN AND PELVIS WITHOUT CONTRAST TECHNIQUE: Multidetector CT imaging of the abdomen and pelvis was performed following the standard protocol without IV contrast. COMPARISON:  11/16/2015 FINDINGS: Lower chest: Mild emphysematous changes are noted. No sizable effusion is seen. Hepatobiliary: The gallbladder is decompressed. The liver is within normal limits. Pancreas: Unremarkable. No pancreatic ductal dilatation or surrounding inflammatory changes. Spleen: Normal in size without focal abnormality. Adrenals/Urinary Tract: Adrenals are within normal limits. Stable renal cysts are noted bilaterally. The bladder is partially decompressed. Stomach/Bowel: Scattered diverticular change of the colon is noted. Some sigmoid wall thickening is seen. The mottled air collection is again identified just anterior to the rectum and slightly eccentric to the left. The contrast load in this region is not  appreciable. Again this may represent a redundant loop of sigmoid although the possibility of an extraluminal collection remains. Vascular/Lymphatic: Aortic atherosclerosis. No enlarged abdominal or pelvic lymph nodes. Reproductive: Left ovarian cyst is again seen. History ectomy is been previously performed. Other: No abdominal wall hernia or abnormality. No abdominopelvic ascites. Musculoskeletal: No acute or significant osseous findings. IMPRESSION: Stable changes in the region of the rectosigmoid. This may simply represent redundant loop of colon although imaging was performed prior to contrast reaching the distal colon. The overall appearance is stable from the prior study. Electronically Signed   By: Inez Catalina M.D.   On: 11/17/2015 19:48   Ct Abdomen Pelvis Wo Contrast  Addendum Date: 11/16/2015   ADDENDUM REPORT: 11/16/2015 16:51 ADDENDUM: These results were called by telephone at the time of interpretation on 11/16/2015 at 4:51 pm to Dr. Oren Binet , who verbally acknowledged these results. Electronically Signed   By: Marin Olp M.D.   On: 11/16/2015 16:51   Result Date: 11/16/2015 CLINICAL DATA:  Fever overnight. EXAM: CT CHEST, ABDOMEN AND PELVIS WITHOUT CONTRAST TECHNIQUE: Multidetector CT imaging of the chest, abdomen and pelvis was performed following the standard protocol without  IV contrast. COMPARISON:  Chest CT 03/19/2009 FINDINGS: CT CHEST FINDINGS Cardiovascular: Mild cardiomegaly. Calcified plaque over the left anterior descending and lateral circumflex coronary arteries. Calcified plaque over the thoracic aorta. Ascending thoracic aorta measures 3.4 cm in AP diameter. Mediastinum/Nodes: No evidence of mediastinal or hilar adenopathy. Remaining mediastinal structures are within normal. Lungs/Pleura: Lungs are well inflated demonstrate mild to moderate centrilobular emphysematous disease. There is mild bibasilar fibrotic change which has progressed compared to the previous exam.  Tiny amount of left pleural fluid. Mild right base atelectasis. Airways within normal. Musculoskeletal: Degenerative change of the spine. CT ABDOMEN PELVIS FINDINGS Hepatobiliary: Gallbladder is contracted.  Liver is within normal. Pancreas: Within normal. Spleen: Within normal. Adrenals/Urinary Tract: Adrenal glands are within normal. Kidneys normal size with multiple bilateral cysts with the largest over the lower pole left kidney measuring 6.6 cm. Ureters are within normal. Bladder demonstrates mild circumferential wall thickening which may be due to underdistention although can be seen with cystitis. Stomach/Bowel: Stomach is within normal. Small bowel is normal. Appendix is normal. Mild fecal retention throughout the colon. There is wall thickening of the sigmoid colon as it courses by the left adnexa. Minimal stranding of the adjacent pericolonic fat. There is a collection of air and mottled lucency which appears to be abutting the left lateral side of the rectosigmoid colon measuring approximately 3.5 x 4.5 cm as this may represent a redundant loop of rectosigmoid colon versus a contained perforation of the rectosigmoid colon. There is no free peritoneal air visualized. Few small lymph nodes in the left perirectal region. These findings could be due to a chronic diverticulitis with possible contain perforation versus contain perforation of rectosigmoid carcinoma with secondary inflammatory change. Vascular/Lymphatic: Moderate calcified plaque over the abdominal aorta. Reproductive: Suggestion of previous hysterectomy. 3.6 cm oval left adnexal cyst likely ovarian cyst. Right ovary within normal. Musculoskeletal: Moderate degenerative changes of the spine and mild degenerate change of the hips. Mild biphasic curvature of the spine. IMPRESSION: No acute findings in the chest. Moderate emphysematous disease with mild bibasilar fibrotic change demonstrating interval progression. Wall thickening of the rectosigmoid  colon with mild adjacent inflammatory change and minimal associated adjacent small lymph nodes. Collection of air mottled lucency along the left lateral aspect of the rectosigmoid colon measuring 3.5 x 4.5 cm. This collection may represent a redundant rectosigmoid loop versus an extraluminal contained perforation either from colon carcinoma or chronic diverticulitis. Consider delayed images as better colonic contrast may help in interpretation of this finding. Mild cardiomegaly and evidence of atherosclerotic coronary artery disease. Aortic atherosclerosis. Bilateral renal cysts with the largest measuring 6.6 cm over the lower pole left kidney. Next Possible 3.6 cm left ovarian cyst. Electronically Signed: By: Marin Olp M.D. On: 11/16/2015 16:42   Dg Chest 1 View  Result Date: 11/30/2015 CLINICAL DATA:  Status post fall, with concern for chest injury. Initial encounter. EXAM: CHEST 1 VIEW COMPARISON:  Chest radiograph performed 11/10/2015, and CT of the chest performed 11/16/2015 FINDINGS: The lungs are hyperexpanded, with flattening of the hemidiaphragms, compatible with COPD. Mild bilateral atelectasis is seen. There is no evidence of pleural effusion or pneumothorax. The cardiomediastinal silhouette is enlarged. No acute osseous abnormalities are seen. Chronic right-sided rib deformities are noted. IMPRESSION: 1. No displaced rib fracture seen. 2. Findings of COPD, with mild bilateral atelectasis. 3. Cardiomegaly. Electronically Signed   By: Garald Balding M.D.   On: 11/30/2015 21:25   Dg Chest 2 View  Result Date: 11/10/2015 CLINICAL DATA:  Hypoxemia EXAM: CHEST  2 VIEW COMPARISON:  03/19/2009 FINDINGS: Cardiac shadow remains enlarged. Aortic calcifications are noted. The lungs demonstrate scattered interstitial changes without focal infiltrate. Some scattered scarring is noted in the left lung base stable from the prior exam. No acute infiltrate or sizable effusion is noted. Old rib fractures on  the right and old right clavicular fracture are noted. IMPRESSION: Chronic changes without acute abnormality. Electronically Signed   By: Inez Catalina M.D.   On: 11/10/2015 14:31   Dg Lumbar Spine Complete  Result Date: 11/30/2015 CLINICAL DATA:  Status post fall, with lower back pain. Initial encounter. EXAM: LUMBAR SPINE - COMPLETE 4+ VIEW COMPARISON:  CT of the abdomen and pelvis performed 11/17/2015 FINDINGS: There is no evidence of fracture or subluxation. Vertebral bodies demonstrate normal height. Mild left convex thoracolumbar scoliosis is noted. Intervertebral disc spaces are preserved. Facet disease is noted along the lumbar spine. The visualized bowel gas pattern is unremarkable in appearance; air and stool are noted within the colon. The sacroiliac joints are within normal limits. Diffuse calcification is seen along the abdominal aorta acute IMPRESSION: 1. No evidence of fracture or subluxation along the lumbar spine. 2. Mild left convex thoracolumbar scoliosis, with mild degenerative change. 3. Diffuse aortic atherosclerosis. Electronically Signed   By: Garald Balding M.D.   On: 11/30/2015 21:23   Ct Head Wo Contrast  Result Date: 11/10/2015 CLINICAL DATA:  Increasing confusion EXAM: CT HEAD WITHOUT CONTRAST TECHNIQUE: Contiguous axial images were obtained from the base of the skull through the vertex without intravenous contrast. COMPARISON:  None. FINDINGS: Brain: There is no intracranial hemorrhage, mass or evidence of acute infarction. There is mild generalized atrophy. There is mild chronic microvascular ischemic change. There is no significant extra-axial fluid collection. No acute intracranial findings are evident. The calvarium and skullbase are intact. Visible paranasal sinuses and orbits are unremarkable. Vascular: No hyperdense vessel or unexpected calcification. Skull: Normal. Negative for fracture or focal lesion. Sinuses/Orbits: Left maxillary sinus air-fluid level IMPRESSION: No  acute intracranial findings. There is moderate generalized atrophy and chronic appearing white matter hypodensities which likely represent small vessel ischemic disease. Electronically Signed   By: Andreas Newport M.D.   On: 11/10/2015 04:05   Ct Chest Wo Contrast  Addendum Date: 11/16/2015   ADDENDUM REPORT: 11/16/2015 16:51 ADDENDUM: These results were called by telephone at the time of interpretation on 11/16/2015 at 4:51 pm to Dr. Oren Binet , who verbally acknowledged these results. Electronically Signed   By: Marin Olp M.D.   On: 11/16/2015 16:51   Result Date: 11/16/2015 CLINICAL DATA:  Fever overnight. EXAM: CT CHEST, ABDOMEN AND PELVIS WITHOUT CONTRAST TECHNIQUE: Multidetector CT imaging of the chest, abdomen and pelvis was performed following the standard protocol without IV contrast. COMPARISON:  Chest CT 03/19/2009 FINDINGS: CT CHEST FINDINGS Cardiovascular: Mild cardiomegaly. Calcified plaque over the left anterior descending and lateral circumflex coronary arteries. Calcified plaque over the thoracic aorta. Ascending thoracic aorta measures 3.4 cm in AP diameter. Mediastinum/Nodes: No evidence of mediastinal or hilar adenopathy. Remaining mediastinal structures are within normal. Lungs/Pleura: Lungs are well inflated demonstrate mild to moderate centrilobular emphysematous disease. There is mild bibasilar fibrotic change which has progressed compared to the previous exam. Tiny amount of left pleural fluid. Mild right base atelectasis. Airways within normal. Musculoskeletal: Degenerative change of the spine. CT ABDOMEN PELVIS FINDINGS Hepatobiliary: Gallbladder is contracted.  Liver is within normal. Pancreas: Within normal. Spleen: Within normal. Adrenals/Urinary Tract: Adrenal glands are within normal. Kidneys  normal size with multiple bilateral cysts with the largest over the lower pole left kidney measuring 6.6 cm. Ureters are within normal. Bladder demonstrates mild circumferential  wall thickening which may be due to underdistention although can be seen with cystitis. Stomach/Bowel: Stomach is within normal. Small bowel is normal. Appendix is normal. Mild fecal retention throughout the colon. There is wall thickening of the sigmoid colon as it courses by the left adnexa. Minimal stranding of the adjacent pericolonic fat. There is a collection of air and mottled lucency which appears to be abutting the left lateral side of the rectosigmoid colon measuring approximately 3.5 x 4.5 cm as this may represent a redundant loop of rectosigmoid colon versus a contained perforation of the rectosigmoid colon. There is no free peritoneal air visualized. Few small lymph nodes in the left perirectal region. These findings could be due to a chronic diverticulitis with possible contain perforation versus contain perforation of rectosigmoid carcinoma with secondary inflammatory change. Vascular/Lymphatic: Moderate calcified plaque over the abdominal aorta. Reproductive: Suggestion of previous hysterectomy. 3.6 cm oval left adnexal cyst likely ovarian cyst. Right ovary within normal. Musculoskeletal: Moderate degenerative changes of the spine and mild degenerate change of the hips. Mild biphasic curvature of the spine. IMPRESSION: No acute findings in the chest. Moderate emphysematous disease with mild bibasilar fibrotic change demonstrating interval progression. Wall thickening of the rectosigmoid colon with mild adjacent inflammatory change and minimal associated adjacent small lymph nodes. Collection of air mottled lucency along the left lateral aspect of the rectosigmoid colon measuring 3.5 x 4.5 cm. This collection may represent a redundant rectosigmoid loop versus an extraluminal contained perforation either from colon carcinoma or chronic diverticulitis. Consider delayed images as better colonic contrast may help in interpretation of this finding. Mild cardiomegaly and evidence of atherosclerotic coronary  artery disease. Aortic atherosclerosis. Bilateral renal cysts with the largest measuring 6.6 cm over the lower pole left kidney. Next Possible 3.6 cm left ovarian cyst. Electronically Signed: By: Marin Olp M.D. On: 11/16/2015 16:42   Ct Cervical Spine Wo Contrast  Result Date: 11/30/2015 CLINICAL DATA:  Found unresponsive on floor at rehabilitation center. Weakness. History of spinal stenosis. EXAM: CT CERVICAL SPINE WITHOUT CONTRAST TECHNIQUE: Multidetector CT imaging of the cervical spine was performed without intravenous contrast. Multiplanar CT image reconstructions were also generated. COMPARISON:  None. FINDINGS: ALIGNMENT: Broad reversed lordosis. Minimal grade 1 C3-4 anterolisthesis off spondylolysis. SKULL BASE AND VERTEBRAE: Cervical vertebral bodies and posterior elements are intact. Severe C4-5, C5-6 disc height loss, moderate to severe at C3-4 and C6-7 associated with uncovertebral hypertrophy, subchondral cysts and endplate spurring compatible with degenerative discs. RIGHT C2-3 facets are fused on degenerative basis. Multilevel moderate to severe facet arthropathy. No destructive bony lesions. C1-2 articulation maintained. Calcified craniocervical ligaments. SOFT TISSUES AND SPINAL CANAL: Soft tissues are nonacute. Superficial 23 x 8 mm probable sebaceous cyst C3-4 paraspinal soft tissues. Mild calcific atherosclerosis of the carotid bifurcations. DISC LEVELS: No significant osseous canal stenosis. Severe LEFT C3-4, moderate to severe bilateral C4-5, severe bilateral C5-6 and moderate to severe LEFT C6-7 neural foraminal narrowing. UPPER CHEST: Lung apices are clear. OTHER: None. IMPRESSION: No acute fracture. Minimal C3-4 anterolisthesis on degenerative basis. Multilevel severe and moderate to severe neural foraminal narrowing. Electronically Signed   By: Elon Alas M.D.   On: 11/30/2015 20:38   Mr Jodene Nam Head Wo Contrast  Result Date: 11/30/2015 CLINICAL DATA:  Altered mental  status after fall. EXAM: MRI HEAD WITHOUT CONTRAST MRA HEAD WITHOUT CONTRAST TECHNIQUE:  Multiplanar, multiecho pulse sequences of the brain and surrounding structures were obtained without intravenous contrast. Angiographic images of the head were obtained using MRA technique without contrast. COMPARISON:  Head CT same day FINDINGS: MRI HEAD FINDINGS Brain: There is a punctate focus of diffusion restriction within the right frontal white matter at the base of the precentral gyrus. No other evidence of ischemia. No acute hemorrhage. The midline structures are normal. There is beginning confluent hyperintense T2-weighted signal within the periventricular and deep white matter, most often seen in the setting of chronic microvascular ischemia. No mass lesion or midline shift. No hydrocephalus or extra-axial fluid collection. No age advanced or lobar predominant atrophy. Vascular: Major intracranial arterial and venous sinus flow voids are preserved. No evidence of chronic microhemorrhage or amyloid angiopathy. Skull and upper cervical spine: The visualized skull base, calvarium, upper cervical spine and extracranial soft tissues are normal. Sinuses/Orbits: Left maxillary retention cyst. Normal orbits. MRA HEAD FINDINGS Intracranial internal carotid arteries: Normal. Anterior cerebral arteries: Normal. Middle cerebral arteries: Normal. Posterior communicating arteries: Present on the right. Posterior cerebral arteries: Normal. Basilar artery: Normal. Vertebral arteries: Left dominant. Normal. Superior cerebellar arteries: Normal. Anterior inferior cerebellar arteries: Normal. Posterior inferior cerebellar arteries: Normal on the left. Not identified on the right. Variable MRA appearance of the posterior inferior cerebellar arteries is not uncommon. IMPRESSION: 1. Punctate focus of acute ischemia within the right frontal white matter at the base of the precentral gyrus. No hemorrhage or mass effect. 2. Chronic  microvascular ischemia. 3. Normal MRA of the circle of Willis and intracranial arteries. Electronically Signed   By: Ulyses Jarred M.D.   On: 11/30/2015 23:31   Mr Brain Wo Contrast  Result Date: 11/30/2015 CLINICAL DATA:  Altered mental status after fall. EXAM: MRI HEAD WITHOUT CONTRAST MRA HEAD WITHOUT CONTRAST TECHNIQUE: Multiplanar, multiecho pulse sequences of the brain and surrounding structures were obtained without intravenous contrast. Angiographic images of the head were obtained using MRA technique without contrast. COMPARISON:  Head CT same day FINDINGS: MRI HEAD FINDINGS Brain: There is a punctate focus of diffusion restriction within the right frontal white matter at the base of the precentral gyrus. No other evidence of ischemia. No acute hemorrhage. The midline structures are normal. There is beginning confluent hyperintense T2-weighted signal within the periventricular and deep white matter, most often seen in the setting of chronic microvascular ischemia. No mass lesion or midline shift. No hydrocephalus or extra-axial fluid collection. No age advanced or lobar predominant atrophy. Vascular: Major intracranial arterial and venous sinus flow voids are preserved. No evidence of chronic microhemorrhage or amyloid angiopathy. Skull and upper cervical spine: The visualized skull base, calvarium, upper cervical spine and extracranial soft tissues are normal. Sinuses/Orbits: Left maxillary retention cyst. Normal orbits. MRA HEAD FINDINGS Intracranial internal carotid arteries: Normal. Anterior cerebral arteries: Normal. Middle cerebral arteries: Normal. Posterior communicating arteries: Present on the right. Posterior cerebral arteries: Normal. Basilar artery: Normal. Vertebral arteries: Left dominant. Normal. Superior cerebellar arteries: Normal. Anterior inferior cerebellar arteries: Normal. Posterior inferior cerebellar arteries: Normal on the left. Not identified on the right. Variable MRA  appearance of the posterior inferior cerebellar arteries is not uncommon. IMPRESSION: 1. Punctate focus of acute ischemia within the right frontal white matter at the base of the precentral gyrus. No hemorrhage or mass effect. 2. Chronic microvascular ischemia. 3. Normal MRA of the circle of Willis and intracranial arteries. Electronically Signed   By: Ulyses Jarred M.D.   On: 11/30/2015 23:31   Dg Hips  Bilat W Or Wo Pelvis 3-4 Views  Result Date: 11/30/2015 CLINICAL DATA:  Status post fall. Concern for pelvic injury. Initial encounter. EXAM: DG HIP (WITH OR WITHOUT PELVIS) 3-4V BILAT COMPARISON:  None. FINDINGS: There is no evidence of fracture or dislocation. Both femoral heads are seated normally within their respective acetabula. The proximal femurs appear intact bilaterally. No significant degenerative change is appreciated. The sacroiliac joints are unremarkable in appearance. The visualized bowel gas pattern is grossly unremarkable in appearance. IMPRESSION: No evidence of fracture or dislocation. Electronically Signed   By: Garald Balding M.D.   On: 11/30/2015 21:21   Ct Head Code Stroke W/o Cm  Result Date: 11/30/2015 CLINICAL DATA:  Code stroke. LEFT-sided weakness and slurred speech. Found on floor unresponsive . Last seen normal at 1500 hours. History of urinary tract infection. EXAM: CT HEAD WITHOUT CONTRAST TECHNIQUE: Contiguous axial images were obtained from the base of the skull through the vertex without intravenous contrast. COMPARISON:  CT HEAD November 10, 2015 FINDINGS: BRAIN: The ventricles and sulci are normal for age. No intraparenchymal hemorrhage, mass effect nor midline shift. Patchy supratentorial white matter hypodensities within normal range for patient's age, though non-specific are most compatible with chronic small vessel ischemic disease. No acute large vascular territory infarcts. No abnormal extra-axial fluid collections. Basal cisterns are patent. VASCULAR: Mild to  moderate calcific atherosclerosis of the carotid siphons. SKULL: No skull fracture. Moderate RIGHT temporomandibular osteoarthrosis. No significant scalp soft tissue swelling. SINUSES/ORBITS: Small LEFT maxillary sinus air-fluid level with frothy secretions, improved. Mild paranasal sinus mucosal thickening. Mastoid air cells are well aerated. Soft tissue within the external auditory canals compatible with cerumen. The included ocular globes and orbital contents are non-suspicious. 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 ASPECTS [facility] Stroke Program Early CT Score) - Ganglionic level infarction (caudate, lentiform nuclei, internal capsule, insula, M1-M3 cortex): 7 - Supraganglionic infarction (M4-M6 cortex): 3 Total score (0-10 with 10 being normal): 10 IMPRESSION: 1. No acute intracranial process ; negative CT HEAD for age. 2. ASPECTS is 10. Critical Value/emergent results were called by telephone at the time of interpretation on 11/30/2015 at 7:21 pm to Dr. Leonel Ramsay, Neurology, who verbally acknowledged these results. Electronically Signed   By: Elon Alas M.D.   On: 11/30/2015 19:22    Microbiology: Recent Results (from the past 240 hour(s))  MRSA PCR Screening     Status: None   Collection Time: 12/01/15  5:08 AM  Result Value Ref Range Status   MRSA by PCR NEGATIVE NEGATIVE Final    Comment:        The GeneXpert MRSA Assay (FDA approved for NASAL specimens only), is one component of a comprehensive MRSA colonization surveillance program. It is not intended to diagnose MRSA infection nor to guide or monitor treatment for MRSA infections.   C difficile quick scan w PCR reflex     Status: None   Collection Time: 12/02/15  6:19 AM  Result Value Ref Range Status   C Diff antigen NEGATIVE NEGATIVE Final   C Diff toxin NEGATIVE NEGATIVE Final   C Diff interpretation No C. difficile detected.  Final     Labs: Basic Metabolic Panel:  Recent Labs Lab  11/30/15 1850 11/30/15 1856 12/01/15 0047 12/02/15 0459 12/03/15 0028  NA 135 136  --  137 136  K 4.7 4.7  --  3.8 3.5  CL 101 98*  --  103 102  CO2 28  --   --  27 28  GLUCOSE 92 91  --  94 93  BUN 16 19  --  8 8  CREATININE 1.16* 1.20* 1.12* 0.96 0.96  CALCIUM 8.3*  --   --  8.0* 8.1*  MG  --   --   --  1.8  --    Liver Function Tests:  Recent Labs Lab 11/30/15 1850 12/02/15 0459  AST 11* 8*  ALT 9* 6*  ALKPHOS 68 56  BILITOT 0.3 0.3  PROT 5.7* 5.2*  ALBUMIN 2.6* 2.2*   No results for input(s): LIPASE, AMYLASE in the last 168 hours.  Recent Labs Lab 12/01/15 0047  AMMONIA 11   CBC:  Recent Labs Lab 11/30/15 1850  12/01/15 0047 12/01/15 1215 12/02/15 0459 12/02/15 1624 12/03/15 0028  WBC 6.7  --  4.6 3.6* 4.3 5.8 6.5  NEUTROABS 4.5  --   --  2.6 2.9  --   --   HGB 8.4*  < > 7.8* 8.9* 7.6* 8.2* 8.1*  HCT 27.9*  < > 26.0* 29.4* 25.4* 26.5* 26.3*  MCV 94.6  --  94.9 95.5 93.4 92.7 92.9  PLT 227  --  174 182 172 181 189  < > = values in this interval not displayed. Cardiac Enzymes:  Recent Labs Lab 12/01/15 0639 12/01/15 1215 12/01/15 2012  TROPONINI <0.03 <0.03 <0.03   BNP: BNP (last 3 results) No results for input(s): BNP in the last 8760 hours.  ProBNP (last 3 results) No results for input(s): PROBNP in the last 8760 hours.  CBG:  Recent Labs Lab 11/30/15 1911  GLUCAP 79       Signed:  Khush Pasion MD.  [facility] 12/03/2015, 11:37 AM

## 2015-12-03 NOTE — Clinical Social Work Note (Signed)
Clinical Social Worker received notification that patient was medically ready for discharge.  CSW spoke with [facility] admissions coordinator who is agreeable with patient return.  Clinical Social Worker facilitated patient discharge including contacting patient family and facility to confirm patient discharge plans.  Clinical information faxed to facility and family agreeable with plan.  CSW arranged ambulance transport via [facility] to [facility].  RN to call report prior to discharge.  Clinical Social Worker will sign off for now as social work intervention is no longer needed. Please consult [place] again if new need arises.  Barbette Or, [place]

## 2015-12-04 ENCOUNTER — Ambulatory Visit (HOSPITAL_COMMUNITY): Admit: 2015-12-04 | Payer: Medicare Other

## 2015-12-04 LAB — URINE MICROSCOPIC-ADD ON

## 2015-12-04 LAB — URINALYSIS, ROUTINE W REFLEX MICROSCOPIC
BILIRUBIN URINE: NEGATIVE
Glucose, UA: NEGATIVE mg/dL
HGB URINE DIPSTICK: NEGATIVE
KETONES UR: NEGATIVE mg/dL
Leukocytes, UA: NEGATIVE
NITRITE: NEGATIVE
PROTEIN: 30 mg/dL — AB
Specific Gravity, Urine: 1.018 (ref 1.005–1.030)
pH: 7.5 (ref 5.0–8.0)

## 2015-12-04 LAB — CBC
HCT: 29 % — ABNORMAL LOW (ref 36.0–46.0)
Hemoglobin: 9 g/dL — ABNORMAL LOW (ref 12.0–15.0)
MCH: 28.9 pg (ref 26.0–34.0)
MCHC: 31 g/dL (ref 30.0–36.0)
MCV: 93.2 fL (ref 78.0–100.0)
PLATELETS: 195 10*3/uL (ref 150–400)
RBC: 3.11 MIL/uL — AB (ref 3.87–5.11)
RDW: 17.5 % — AB (ref 11.5–15.5)
WBC: 7 10*3/uL (ref 4.0–10.5)

## 2015-12-04 LAB — BASIC METABOLIC PANEL
ANION GAP: 7 (ref 5–15)
BUN: 12 mg/dL (ref 6–20)
CALCIUM: 8.1 mg/dL — AB (ref 8.9–10.3)
CO2: 29 mmol/L (ref 22–32)
Chloride: 104 mmol/L (ref 101–111)
Creatinine, Ser: 1.08 mg/dL — ABNORMAL HIGH (ref 0.44–1.00)
GFR, EST AFRICAN AMERICAN: 56 mL/min — AB (ref >60)
GFR, EST NON AFRICAN AMERICAN: 49 mL/min — AB (ref >60)
GLUCOSE: 107 mg/dL — AB (ref 65–99)
Potassium: 3.4 mmol/L — ABNORMAL LOW (ref 3.5–5.1)
SODIUM: 140 mmol/L (ref 135–145)

## 2015-12-04 NOTE — Progress Notes (Signed)
Pt seen and examined, discharge was held last evening due to low grade fever of 100.4, likely due to IV Iron she got in the afternoon, UA and CXR negative, WBC normal, afebrile today -will discharge to SNF today -Rest of the Plan per my Discharge summary from 11/23  Domenic Polite

## 2015-12-04 NOTE — Clinical Social Work Note (Signed)
Clinical Social Work Assessment  Patient Details  Name: Dawn [name] MRN: [id number] Date of Birth: 08-22-1940  Date of referral:  12/04/15               Reason for consult:  Facility Placement                Permission sought to share information with:  Family Supports Permission granted to share information::  Yes, Verbal Permission Granted  Name::     Delene Ruffini  Relationship::  daughter  Contact Information:  [phone]  Housing/Transportation Living arrangements for the past 2 months:  [place], [place] of Information:  Patient Patient Interpreter Needed:  None Criminal Activity/Legal Involvement Pertinent to Current Situation/Hospitalization:  No - Comment as needed Significant Relationships:  Adult Children Lives with:  Self Do you feel safe going back to the place where you live?  Yes Need for family participation in patient care:  Yes (Comment)  Care giving concerns:  No care giving concerns identified.   Social Worker assessment / plan:  CSW met with pt to address consult for pt as she was admitted from [facility]. Pt is ready for discharge today. Pt is able to return, per facility. Pt is aware and agreeable to returning to facility. CSW left a message with pt's daughter requesting a return phone call. RN will call report. [facility] will provide transportation. CSW is signing off as no further needs identified.   Employment status:  Retired Forensic scientist:  Medicare PT Recommendations:  [place] / Referral to community resources:  [place]  Patient/Family's Response to care:  Pt was appreciative of CSW support.  Patient/Family's Understanding of and Emotional Response to Diagnosis, Current Treatment, and Prognosis:  Pt understands that she needs to return to facility.   Emotional Assessment Appearance:  Appears stated age Attitude/Demeanor/Rapport:   (Appropriate) Affect  (typically observed):  Accepting, Adaptable Orientation:  Oriented to Self, Oriented to Place, Oriented to Situation Alcohol / Substance use:  Never Used Psych involvement (Current and /or in the community):  No (Comment)  Discharge Needs  Concerns to be addressed:  No discharge needs identified Readmission within the last 30 days:  Yes Current discharge risk:  Chronically ill Barriers to Discharge:  No Barriers Identified   Darden Dates, LCSW 12/04/2015, 2:50 PM

## 2015-12-05 LAB — URINE CULTURE

## 2015-12-07 ENCOUNTER — Encounter: Payer: Self-pay | Admitting: Internal Medicine

## 2015-12-07 ENCOUNTER — Non-Acute Institutional Stay (SKILLED_NURSING_FACILITY): Payer: Medicare Other | Admitting: Internal Medicine

## 2015-12-07 DIAGNOSIS — D508 Other iron deficiency anemias: Secondary | ICD-10-CM

## 2015-12-07 DIAGNOSIS — I471 Supraventricular tachycardia: Secondary | ICD-10-CM

## 2015-12-07 DIAGNOSIS — E0590 Thyrotoxicosis, unspecified without thyrotoxic crisis or storm: Secondary | ICD-10-CM

## 2015-12-07 DIAGNOSIS — G9340 Encephalopathy, unspecified: Secondary | ICD-10-CM

## 2015-12-07 DIAGNOSIS — N183 Chronic kidney disease, stage 3 unspecified: Secondary | ICD-10-CM

## 2015-12-07 DIAGNOSIS — F329 Major depressive disorder, single episode, unspecified: Secondary | ICD-10-CM

## 2015-12-07 DIAGNOSIS — F419 Anxiety disorder, unspecified: Secondary | ICD-10-CM | POA: Diagnosis not present

## 2015-12-07 DIAGNOSIS — G894 Chronic pain syndrome: Secondary | ICD-10-CM | POA: Diagnosis not present

## 2015-12-07 DIAGNOSIS — F32A Depression, unspecified: Secondary | ICD-10-CM

## 2015-12-07 NOTE — Progress Notes (Signed)
: Provider:  Noah Delaine. Sheppard Coil, MD Location:  [facility] Room Number: 932T Place of Service:  SNF (31)  PCP: [name], MD Patient Care Team: Lin Landsman, MD as PCP - General (Family Medicine)  Extended Emergency Contact Information Primary Emergency Contact: Hatfield,Lisa Address: [address]          [place], [place] [address] [place] Work Phone: [phone] Mobile Phone: [phone] Relation: Daughter     Allergies: Patient has no known allergies.  Chief Complaint  Patient presents with  . New Admit To SNF    Admit to Facility    HPI: Patient is 75 y.o. female who was admitted 3 weeks ago for encephalopathy in the setting of possible sepsis and newly diagnosed hyperthyroidism was brought to the ER after family found the patient had a sudden change in mental status. Was found on the floor last evening around 3 PM. Patient was brought as a code stroke in the ER. On the way to the ER EMS staff noticed patient had pinpoint pupils and was given Narcan with some improvement in mental status. CT of the head did not show anything acute.Symptoms felt to be related to polypharmacy ( high dose gabapentin/xanax/oxycodone etc). Neurontin was d/c and xanax and oxycodone were  decreased.Work up was unremarkable except for chronic anemia and known hyperthyroidism requiring an increase in tapazole. Pt was admitted to SNF for OT/PT and supportive care. While at SNF pt will be followed for anemia, tx with iron, anxiety , tx with xanax and depression, tx wit wellbutrin.  Past Medical History:  Diagnosis Date  . COPD (chronic obstructive pulmonary disease) ([place])   . History of anxiety   . History of depression   . Spinal stenosis   . UTI (urinary tract infection)     Past Surgical History:  Procedure Laterality Date  . FLEXIBLE SIGMOIDOSCOPY Left 11/18/2015   Procedure: FLEXIBLE SIGMOIDOSCOPY;  Surgeon: [name], MD;  Location: [facility];   Service: Endoscopy;  Laterality: Left;  . FLEXIBLE SIGMOIDOSCOPY N/A 11/20/2015   Procedure: FLEXIBLE SIGMOIDOSCOPY;  Surgeon: [name], MD;  Location: [facility] ENDOSCOPY;  Service: Endoscopy;  Laterality: N/A;  . NO PAST SURGERIES        Medication List       Accurate as of 12/07/15 11:09 AM. Always use your most recent med list.          ALPRAZolam 0.5 MG tablet Commonly known as:  XANAX Take 1 tablet (0.5 mg total) by mouth 2 (two) times daily as needed for anxiety.   buPROPion 150 MG 24 hr tablet Commonly known as:  WELLBUTRIN XL Take 150 mg by mouth daily.   FERROCITE 324 (106 Fe) MG Tabs tablet Generic drug:  Ferrous Fumarate Take 1 tablet (106 mg of iron total) by mouth 2 (two) times daily after a meal.   INCRUSE ELLIPTA 62.5 MCG/INH Aepb Generic drug:  umeclidinium bromide Inhale 1 puff into the lungs daily.   loperamide 2 MG capsule Commonly known as:  IMODIUM Take 1 capsule (2 mg total) by mouth 2 (two) times daily.   methimazole 10 MG tablet Commonly known as:  TAPAZOLE Take 1.5 tablets (15 mg total) by mouth 2 (two) times daily.   metoprolol tartrate 25 MG tablet Commonly known as:  LOPRESSOR Take 1 tablet (25 mg total) by mouth 2 (two) times daily.   oxyCODONE 5 MG immediate release tablet Commonly known as:  Oxy IR/ROXICODONE Take 1 tablet (5  mg total) by mouth every 6 (six) hours as needed for moderate pain.   traZODone 100 MG tablet Commonly known as:  DESYREL Take 100 mg by mouth at bedtime.       No orders of the defined types were placed in this encounter.    There is no immunization history on file for this patient.  Social History  Substance Use Topics  . Smoking status: Former Research scientist (life sciences)  . Smokeless tobacco: Never Used  . Alcohol use No    Family history is   Family History  Problem Relation Age of Onset  . Heart attack Mother   . Diabetes Father       Review of Systems  DATA OBTAINED: from patient, nurse GENERAL:  no  fevers, fatigue, appetite changes SKIN: No itching, or rash EYES: No eye pain, redness, discharge EARS: No earache, tinnitus, change in hearing NOSE: No congestion, drainage or bleeding  MOUTH/THROAT: No mouth or tooth pain, No sore throat RESPIRATORY: No cough, wheezing, SOB CARDIAC: No chest pain, palpitations, lower extremity edema  GI: No abdominal pain, No N/V or constipation,daily diarrhea,  No heartburn or reflux  GU: No dysuria, frequency or urgency, or incontinence  MUSCULOSKELETAL: No unrelieved bone/joint pain NEUROLOGIC: No headache, dizziness or focal weakness PSYCHIATRIC: No c/o anxiety or sadness   Vitals:   12/07/15 1055  BP: 106/60  Pulse: 74  Resp: 18  Temp: 99.9 F (37.7 C)    SpO2 Readings from Last 1 Encounters:  12/07/15 93%   Body mass index is 19.54 kg/m.     Physical Exam  GENERAL APPEARANCE: Alert, conversant,  No acute distress.  SKIN: No diaphoresis rash HEAD: Normocephalic, atraumatic  EYES: Conjunctiva/lids clear. Pupils round, reactive. EOMs intact.  EARS: External exam WNL, canals clear. Hearing grossly normal.  NOSE: No deformity or discharge.  MOUTH/THROAT: Lips w/o lesions  RESPIRATORY: Breathing is even, unlabored. Lung sounds are clear   CARDIOVASCULAR: Heart RRR no murmurs, rubs or gallops. No peripheral edema.   GASTROINTESTINAL: Abdomen is soft, non-tender, not distended w/ normal bowel sounds. GENITOURINARY: Bladder non tender, not distended  MUSCULOSKELETAL: No abnormal joints or musculature NEUROLOGIC:  Cranial nerves 2-12 grossly intact. Moves all extremities  PSYCHIATRIC: Mood and affect appropriate to situation, no behavioral issues  Patient Active Problem List   Diagnosis Date Noted  . Alteration consciousness 11/30/2015  . SVT (supraventricular tachycardia) ([place]) 11/27/2015  . Diarrhea 11/27/2015  . Depression 11/27/2015  . Polyneuropathy ([place]) 11/27/2015  . Renal insufficiency   . Protein-calorie malnutrition,  severe 11/18/2015  . Intra-abdominal fluid collection   . Premature atrial contractions   . Functional diarrhea   . Sepsis ([place])   . Hyperthyroidism   . Hypotension   . Septic shock ([place])   . Acute encephalopathy   . Hypokalemia   . Acute delirium 11/10/2015  . Acute kidney injury ([place]) 11/10/2015  . Normocytic anemia 11/10/2015  . Anxiety and depression 11/10/2015  . Chronic pain syndrome 11/10/2015  . Recurrent UTI 11/10/2015  . Acute respiratory failure with hypoxia ([place]) 11/10/2015  . COPD (chronic obstructive pulmonary disease) ([place]) 11/10/2015  . Acute hyperglycemia 11/10/2015  . CKD (chronic kidney disease) stage 3, GFR 30-59 ml/min 11/10/2015  . Narrow complex tachycardia ([place]) 11/10/2015  . HTN (hypertension) 11/10/2015  . Generalized weakness 11/10/2015  . Anemia   . Delirium   . Hypoxemia   . Tachycardia       Labs reviewed: Basic Metabolic Panel:    Component Value Date/Time  NA 140 12/04/2015 0026   NA 136 (A) 12/03/2015   K 3.4 (L) 12/04/2015 0026   CL 104 12/04/2015 0026   CO2 29 12/04/2015 0026   GLUCOSE 107 (H) 12/04/2015 0026   BUN 12 12/04/2015 0026   BUN 8 12/03/2015   CREATININE 1.08 (H) 12/04/2015 0026   CALCIUM 8.1 (L) 12/04/2015 0026   PROT 5.2 (L) 12/02/2015 0459   ALBUMIN 2.2 (L) 12/02/2015 0459   AST 8 (L) 12/02/2015 0459   ALT 6 (L) 12/02/2015 0459   ALKPHOS 56 12/02/2015 0459   BILITOT 0.3 12/02/2015 0459   GFRNONAA 49 (L) 12/04/2015 0026   GFRAA 56 (L) 12/04/2015 0026     Recent Labs  11/12/15 0455 11/13/15 0129  11/14/15 0556  11/19/15 0428  12/02/15 0459 12/03/15 12/03/15 0028 12/04/15 0026  NA 139 139  < > 139  < > 138  < > 137  137 136* 136 140  K 4.2 3.6  < > 3.0*  < > 4.7  < > 3.8  3.8 3.5 3.5 3.4*  CL 108 106  --  109  < > 97*  < > 103  --  102 104  CO2 25 25  --  24  < > 35*  < > 27  --  28 29  GLUCOSE 92 125*  --  86  < > 88  < > 94  --  93 107*  BUN 10 13  < > 11  < > 14  < > '8  8 8 8 12  '$ CREATININE  0.93 1.10*  < > 0.90  < > 1.19*  < > 0.96  1.0 1.0 0.96 1.08*  CALCIUM 7.6* 7.3*  --  7.3*  < > 8.1*  < > 8.0*  --  8.1* 8.1*  MG 1.5* 2.4  --  1.9  --  2.4  --  1.8  --   --   --   PHOS 1.9* 3.7  --  3.2  --   --   --   --   --   --   --   < > = values in this interval not displayed. Liver Function Tests:  Recent Labs  11/11/15 0734  11/14/15 0556 11/30/15 1850 12/02/15 0459  AST 12*  --   --  11*  11* 8*  ALT 8*  --   --  9*  9 6*  ALKPHOS 52  --   --  68  68 56  BILITOT 0.4  --   --  0.3 0.3  PROT 4.6*  --   --  5.7* 5.2*  ALBUMIN 1.7*  < > 1.6* 2.6* 2.2*  < > = values in this interval not displayed. No results for input(s): LIPASE, AMYLASE in the last 8760 hours.  Recent Labs  12/01/15 0047  AMMONIA 11   CBC:  Recent Labs  11/30/15 1850  12/01/15 1215 12/02/15 0459 12/02/15 1624 12/03/15 12/03/15 0028 12/04/15 0300  WBC 6.7  < > 3.6  3.6* 4.3  4.3 5.8  5.8 6.5 6.5 7.0  NEUTROABS 4.5  --  2.6 2.9  --   --   --   --   HGB 8.4*  < > 8.9*  8.9* 7.6*  7.6* 8.2*  8.2* 8.1* 8.1* 9.0*  HCT 27.9*  < > 29.4*  29* 25.4*  25* 26.5*  27* 26* 26.3* 29.0*  MCV 94.6  < > 95.5 93.4 92.7  --  92.9 93.2  PLT  227  < > 182  182 172  172 181  181 189 189 195  < > = values in this interval not displayed. Lipid  Recent Labs  12/01/15 0644  CHOL 107  HDL 27*  LDLCALC 59  TRIG 107    Cardiac Enzymes:  Recent Labs  12/01/15 0639 12/01/15 1215 12/01/15 2012  TROPONINI <0.03 <0.03 <0.03   BNP: No results for input(s): BNP in the last 8760 hours. No results found for: [facility] Lab Results  Component Value Date   HGBA1C 5.2 12/01/2015   Lab Results  Component Value Date   TSH 0.034 (L) 12/01/2015   Lab Results  Component Value Date   VITAMINB12 559 12/02/2015   Lab Results  Component Value Date   FOLATE 18.0 12/02/2015   Lab Results  Component Value Date   IRON 11 (L) 12/02/2015   TIBC 179 (L) 12/02/2015   FERRITIN 249 12/02/2015     Imaging and Procedures obtained prior to SNF admission: No results found.   Not all labs, radiology exams or other studies done during hospitalization come through on my EPIC note; however they are reviewed by me.    Assessment and Plan  ACUTE ENCEPHALOPATHY - Punctate infarct is artifact vs incidental per neuro; symptoms felt to be related to polypharmacy ( high dose gabapentin/xanax/oxycodone etc; gabapentin was d/c ;mentation improved, xanax and oxycodone dose also decreased; -2D ECHo shows normal EF and grade 2DD and Carotid duplex-unremarkabe; continue ASA '325mg'$  daily for secondary stroke prevention LDL-59 and Hba1c was 5.2 SNF - admitted for OT/PT and supportive care  CHRONIC ANEMIA- no overt bleeding, Hb in 7.6-8 range and today is 8.; anemia panel with severe Iron defi, given IV Iron in hospital; increased PO Iron at discharge and she is uptodate on colonoscopy  SNF - will f/u CBC; cont iron 324 mg BID  CHRONIC PAIN  SNF - plan cont meds at lower doses; meds written as prn for 2 weeks; plan to not escalate use  HYPERTHYROIDISM - recently diagnosed - continue Tapazole and metoprolol.-increased tapazole dose to '15mg'$  BID from '10mg'$  BID, since Free T4 still high and with diarrhea, monitor CBC while on this SNF - cont tapazole 15 mg BID and cont metoprolol 25 mmg BID; pt has f/u with endocrine as outpt  RECENT SVT- pt stable on metoprolol SNF - will monitor HR  CKD3 - baseline SNF - f/u BMP  ANXIETY  SNF - stable; have kept xanax at 0.5 mg BID PRN for 2 weeks to see if we can cut down use  DEPRESSION SNF - stable; cont Wellbutrin 150 mg daily    Time spent > 45 min;> 50% of time with patient was spent reviewing records, labs, tests and studies, counseling and developing plan of care  Webb Silversmith D. Sheppard Coil, MD

## 2015-12-10 ENCOUNTER — Ambulatory Visit (INDEPENDENT_AMBULATORY_CARE_PROVIDER_SITE_OTHER): Payer: Medicare Other | Admitting: Endocrinology

## 2015-12-10 ENCOUNTER — Encounter: Payer: Self-pay | Admitting: Endocrinology

## 2015-12-10 VITALS — BP 122/80 | HR 62 | Wt 122.0 lb

## 2015-12-10 DIAGNOSIS — E0590 Thyrotoxicosis, unspecified without thyrotoxic crisis or storm: Secondary | ICD-10-CM

## 2015-12-10 LAB — COMPREHENSIVE METABOLIC PANEL
ALBUMIN: 3.1 g/dL — AB (ref 3.5–5.2)
ALT: 6 U/L (ref 0–35)
AST: 8 U/L (ref 0–37)
Alkaline Phosphatase: 64 U/L (ref 39–117)
BILIRUBIN TOTAL: 0.4 mg/dL (ref 0.2–1.2)
BUN: 20 mg/dL (ref 6–23)
CALCIUM: 8.5 mg/dL (ref 8.4–10.5)
CO2: 29 meq/L (ref 19–32)
CREATININE: 1.19 mg/dL (ref 0.40–1.20)
Chloride: 98 mEq/L (ref 96–112)
GFR: 46.82 mL/min — ABNORMAL LOW (ref >60.00)
Glucose, Bld: 89 mg/dL (ref 70–99)
Potassium: 4.3 mEq/L (ref 3.5–5.1)
Sodium: 134 mEq/L — ABNORMAL LOW (ref 135–145)
Total Protein: 6.8 g/dL (ref 6.0–8.3)

## 2015-12-10 NOTE — Patient Instructions (Signed)
Your thyroid levels are being checked today. We will fax information to the nursing home about adjusting the dose of methimazole based on today's tests

## 2015-12-10 NOTE — Progress Notes (Signed)
Patient ID: Dawn [name], female   DOB: 04/24/1940, 75 y.o.   MRN: [id number]                                                                                                                Reason for Appointment:  Hyperthyroidism, new consultation  Referring physician: Hospitalist   History of Present Illness:   The patient has dementia and is unable to give any history today  She is coming from a nursing home and apparently has been there at least since her hospitalization for encephalopathy Hospital records indicated that she had newly diagnosed hyperthyroidism and 10/70 Thyroid functions were probably none because of her having diarrhea and episodes of SVT. She was likely started on methimazole at the beginning of November when her free T4 was normal  The patient however still complains of having diarrhea including at night Think she has also been losing weight but not clear how long, she may have lost 10 pounds She has had some shakiness of her hands for about 4 months On her first hospitalization she was started on methimazole 10 mg twice a day and on readmission about 10 days ago this was increased to 15 mg twice a day since free T4 was still relatively high  She has been evaluated with thyroid function tests recently and they showed the following:     Lab Results  Component Value Date   FREET4 1.44 (H) 12/01/2015   FREET4 2.40 (H) 11/11/2015   TSH 0.034 (L) 12/01/2015   TSH 0.289 (L) 11/10/2015   No results found for: T3FREE       Medication List       Accurate as of 12/10/15  3:09 PM. Always use your most recent med list.          ALPRAZolam 0.5 MG tablet Commonly known as:  XANAX Take 1 tablet (0.5 mg total) by mouth 2 (two) times daily as needed for anxiety.   buPROPion 150 MG 24 hr tablet Commonly known as:  WELLBUTRIN XL Take 150 mg by mouth daily.   FERROCITE 324 (106 Fe) MG Tabs tablet Generic drug:  Ferrous Fumarate Take 1 tablet (106 mg of  iron total) by mouth 2 (two) times daily after a meal.   INCRUSE ELLIPTA 62.5 MCG/INH Aepb Generic drug:  umeclidinium bromide Inhale 1 puff into the lungs daily.   loperamide 2 MG capsule Commonly known as:  IMODIUM Take 1 capsule (2 mg total) by mouth 2 (two) times daily.   methimazole 10 MG tablet Commonly known as:  TAPAZOLE Take 1.5 tablets (15 mg total) by mouth 2 (two) times daily.   metoprolol tartrate 25 MG tablet Commonly known as:  LOPRESSOR Take 1 tablet (25 mg total) by mouth 2 (two) times daily.   oxyCODONE 5 MG immediate release tablet Commonly known as:  Oxy IR/ROXICODONE Take 1 tablet (5 mg total) by mouth every 6 (six) hours as needed for moderate pain.   traZODone 100 MG tablet Commonly known as:  DESYREL  Take 100 mg by mouth at bedtime.           Past Medical History:  Diagnosis Date  . COPD (chronic obstructive pulmonary disease) ([place])   . History of anxiety   . History of depression   . Spinal stenosis   . UTI (urinary tract infection)     Past Surgical History:  Procedure Laterality Date  . FLEXIBLE SIGMOIDOSCOPY Left 11/18/2015   Procedure: FLEXIBLE SIGMOIDOSCOPY;  Surgeon: [name], MD;  Location: [place];  Service: Endoscopy;  Laterality: Left;  . FLEXIBLE SIGMOIDOSCOPY N/A 11/20/2015   Procedure: FLEXIBLE SIGMOIDOSCOPY;  Surgeon: [name], MD;  Location[facility];  Service: Endoscopy;  Laterality: N/A;  . NO PAST SURGERIES      Family History  Problem Relation Age of Onset  . Heart attack Mother   . Diabetes Father     Social History:  reports that she has quit smoking. She has never used smokeless tobacco. She reports that she does not drink alcohol. Her drug history is not on file.  Allergies: No Known Allergies  Review of Systems:  Review of Systems  Constitutional: Positive for weight loss. Negative for reduced appetite.  HENT: Negative for trouble swallowing.   Eyes: Negative for blurred vision.  Respiratory:  Positive for shortness of breath.   Cardiovascular: Negative for palpitations and leg swelling.  Gastrointestinal: Positive for diarrhea. Negative for abdominal pain.  Endocrine: Positive for fatigue and cold intolerance. Negative for heat intolerance.  Musculoskeletal: Negative for joint pain.  Neurological: Positive for weakness and tremors.  Psychiatric/Behavioral: Positive for nervousness and insomnia.      Examination:   BP 122/80   Pulse 62   Wt 122 lb (55.3 kg)   BMI 19.69 kg/m    General Appearance:  well-built and nourished, pleasant, not anxious or hyperkinetic.        Eyes: No unusual prominence, lid lag or stare. No swelling of the eyelids.  Fundus exam not indicated. Neck: The thyroid is not enlarged    There is no lymphadenopathy .          Heart: normal S1 and S2, no murmurs .          Lungs: breath sounds are clear bilaterally Abdomen: no hepatosplenomegaly or other palpable abnormality  Extremities: hands are warm. No ankle edema. Neurological: She is alert and appears oriented No obvious weakness in her legs Deep tendon reflexes at biceps are Relatively brisk, ankle jerks not elicited Bilateral Coarse tremors of the hands  present Skin: No rash, abnormal thickening of the skin on legs or pigmentation seen     Assessment/Plan:   Hyperthyroidism, Likely to be from Graves' disease  She has been on antithyroid drugs started at the hospital about 4 weeks ago More recent free T4 level has been mildly increased Difficult to assess her symptoms because of her dementia and she continues to have symptoms of diarrhea but no other typical symptoms of hyperthyroidism and her pulse is controlled now  For now will recheck her thyroid levels today and decide on doses of methimazole, currently on 15 mg twice a day She may be able to stop metoprolol as pulse is relatively slow  Will have her come back in 3 weeks for follow-up also If she continues to require relatively  high doses of methimazole may consider I-131 treatment  ANEMIA and diarrhea: To be evaluated by PCP   [facility] 12/10/2015, 3:09 PM

## 2015-12-11 ENCOUNTER — Non-Acute Institutional Stay (SKILLED_NURSING_FACILITY): Payer: Medicare Other | Admitting: Internal Medicine

## 2015-12-11 ENCOUNTER — Encounter: Payer: Self-pay | Admitting: Internal Medicine

## 2015-12-11 DIAGNOSIS — J449 Chronic obstructive pulmonary disease, unspecified: Secondary | ICD-10-CM

## 2015-12-11 DIAGNOSIS — F329 Major depressive disorder, single episode, unspecified: Secondary | ICD-10-CM | POA: Diagnosis not present

## 2015-12-11 DIAGNOSIS — F418 Other specified anxiety disorders: Secondary | ICD-10-CM

## 2015-12-11 DIAGNOSIS — N183 Chronic kidney disease, stage 3 unspecified: Secondary | ICD-10-CM

## 2015-12-11 DIAGNOSIS — F419 Anxiety disorder, unspecified: Secondary | ICD-10-CM

## 2015-12-11 DIAGNOSIS — E43 Unspecified severe protein-calorie malnutrition: Secondary | ICD-10-CM

## 2015-12-11 DIAGNOSIS — F32A Depression, unspecified: Secondary | ICD-10-CM

## 2015-12-11 DIAGNOSIS — D649 Anemia, unspecified: Secondary | ICD-10-CM | POA: Diagnosis not present

## 2015-12-11 DIAGNOSIS — E0590 Thyrotoxicosis, unspecified without thyrotoxic crisis or storm: Secondary | ICD-10-CM

## 2015-12-11 DIAGNOSIS — I10 Essential (primary) hypertension: Secondary | ICD-10-CM | POA: Diagnosis not present

## 2015-12-11 DIAGNOSIS — G9340 Encephalopathy, unspecified: Secondary | ICD-10-CM | POA: Diagnosis not present

## 2015-12-11 DIAGNOSIS — G894 Chronic pain syndrome: Secondary | ICD-10-CM | POA: Diagnosis not present

## 2015-12-11 DIAGNOSIS — R404 Transient alteration of awareness: Secondary | ICD-10-CM

## 2015-12-11 LAB — T3, FREE: T3, Free: 2.4 pg/mL (ref 2.3–4.2)

## 2015-12-11 LAB — T4, FREE: Free T4: 1.22 ng/dL (ref 0.60–1.60)

## 2015-12-11 NOTE — Progress Notes (Signed)
Location:  [place] Room Number: 193X Place of Service:  SNF (31) Noah Delaine. Sheppard Coil, MD  PCP: [name], MD Patient Care Team: Lin Landsman, MD as PCP - [facility])  Extended Emergency Contact Information Primary Emergency Contact: Hatfield,Lisa Address: [address]          [place], [place] [address] Johnnette Litter of [place] Work Phone: [phone] Mobile Phone: [phone] Relation: Daughter  No Known Allergies  Chief Complaint  Patient presents with  . Discharge Note    Discharged from SNF    HPI:  75 y.o. female  admitted 3 weeks ago for encephalopathy in the setting of possible sepsis and newly diagnosed hyperthyroidism was brought to the ER after family found the patient had a sudden change in mental status. Was found on the floor last evening around 3 PM. Patient was brought as a code stroke in the ER. On the way to the ER EMS staff noticed patient had pinpoint pupils and was given Narcan with some improvement in mental status. CT of the head did not show anything acute.Symptoms felt to be related to polypharmacy ( high dose gabapentin/xanax/oxycodone etc). Neurontin was d/c and xanax and oxycodone were  decreased.Work up was unremarkable. Pt was admitted to SNF for OT/PT and supportive care and is now ready to be d/c to home.    Past Medical History:  Diagnosis Date  . COPD (chronic obstructive pulmonary disease) ([place])   . History of anxiety   . History of depression   . Spinal stenosis   . UTI (urinary tract infection)     Past Surgical History:  Procedure Laterality Date  . FLEXIBLE SIGMOIDOSCOPY Left 11/18/2015   Procedure: FLEXIBLE SIGMOIDOSCOPY;  Surgeon: [name], MD;  Location: [place];  Service: Endoscopy;  Laterality: Left;  . FLEXIBLE SIGMOIDOSCOPY N/A 11/20/2015   Procedure: FLEXIBLE SIGMOIDOSCOPY;  Surgeon: [name], MD;  Location: [facility] ENDOSCOPY;  Service: Endoscopy;  Laterality: N/A;  . NO PAST  SURGERIES       reports that she has quit smoking. She has never used smokeless tobacco. She reports that she does not drink alcohol. Her drug history is not on file. Social History   Social History  . Marital status: Divorced    Spouse name: N/A  . Number of children: N/A  . Years of education: N/A   Occupational History  . Not on file.   Social History Main Topics  . Smoking status: Former Research scientist (life sciences)  . Smokeless tobacco: Never Used  . Alcohol use No  . Drug use: Unknown  . Sexual activity: Not on file   Other Topics Concern  . Not on file   Social History Narrative  . No narrative on file    Pertinent  Health Maintenance Due  Topic Date Due  . DEXA SCAN  07/06/2004  . PNA vac Low Risk Adult (1 of 2 - PCV13) 07/06/2004  . INFLUENZA VACCINE  08/11/2015    Medications:   Medication List       Accurate as of 12/11/15  6:02 PM. Always use your most recent med list.          ALPRAZolam 0.5 MG tablet Commonly known as:  XANAX Take 1 tablet (0.5 mg total) by mouth 2 (two) times daily as needed for anxiety.   buPROPion 150 MG 24 hr tablet Commonly known as:  WELLBUTRIN XL Take 150 mg by mouth daily.   FERROCITE 324 (106 Fe) MG Tabs tablet Generic  drug:  Ferrous Fumarate Take 1 tablet (106 mg of iron total) by mouth 2 (two) times daily after a meal.   INCRUSE ELLIPTA 62.5 MCG/INH Aepb Generic drug:  umeclidinium bromide Inhale 1 puff into the lungs daily.   lactose free nutrition Liqd Take 237 mLs by mouth 2 (two) times daily between meals.   loperamide 2 MG capsule Commonly known as:  IMODIUM Take 1 capsule (2 mg total) by mouth 2 (two) times daily.   methimazole 10 MG tablet Commonly known as:  TAPAZOLE Take 1.5 tablets (15 mg total) by mouth 2 (two) times daily.   metoprolol tartrate 25 MG tablet Commonly known as:  LOPRESSOR Take 1 tablet (25 mg total) by mouth 2 (two) times daily.   oxyCODONE 5 MG immediate release tablet Commonly known as:  Oxy  IR/ROXICODONE Take 1 tablet (5 mg total) by mouth every 6 (six) hours as needed for moderate pain.   traZODone 100 MG tablet Commonly known as:  DESYREL Take 100 mg by mouth at bedtime.        Vitals:   12/11/15 0952  BP: (!) 110/50  Pulse: (!) 59  Resp: 17  Temp: 98 F (36.7 C)  Weight: 122 lb (55.3 kg)  Height: '5\' 6"'$  (1.676 m)   Body mass index is 19.69 kg/m.  Physical Exam  GENERAL APPEARANCE: Alert, conversant. No acute distress.  HEENT: Unremarkable. RESPIRATORY: Breathing is even, unlabored. Lung sounds are clear   CARDIOVASCULAR: Heart RRR no murmurs, rubs or gallops. No peripheral edema.  GASTROINTESTINAL: Abdomen is soft, non-tender, not distended w/ normal bowel sounds.  NEUROLOGIC: Cranial nerves 2-12 grossly intact. Moves all extremities   Labs reviewed: Basic Metabolic Panel:  Recent Labs  11/12/15 0455 11/13/15 0129  11/14/15 0556  11/19/15 0428  12/02/15 0459  12/03/15 0028 12/04/15 0026 12/10/15 1524  NA 139 139  < > 139  < > 138  < > 137  137  < > 136 140 134*  K 4.2 3.6  < > 3.0*  < > 4.7  < > 3.8  3.8  < > 3.5 3.4* 4.3  CL 108 106  --  109  < > 97*  < > 103  --  102 104 98  CO2 25 25  --  24  < > 35*  < > 27  --  '28 29 29  '$ GLUCOSE 92 125*  --  86  < > 88  < > 94  --  93 107* 89  BUN 10 13  < > 11  < > 14  < > 8  8  < > '8 12 20  '$ CREATININE 0.93 1.10*  < > 0.90  < > 1.19*  < > 0.96  1.0  < > 0.96 1.08* 1.19  CALCIUM 7.6* 7.3*  --  7.3*  < > 8.1*  < > 8.0*  --  8.1* 8.1* 8.5  MG 1.5* 2.4  --  1.9  --  2.4  --  1.8  --   --   --   --   PHOS 1.9* 3.7  --  3.2  --   --   --   --   --   --   --   --   < > = values in this interval not displayed. No results found for: [facility] Liver Function Tests:  Recent Labs  11/30/15 1850 12/02/15 0459 12/10/15 1524  AST 11*  11* 8* 8  ALT 9*  9 6*  6  ALKPHOS 68  68 56 64  BILITOT 0.3 0.3 0.4  PROT 5.7* 5.2* 6.8  ALBUMIN 2.6* 2.2* 3.1*   No results for input(s): LIPASE, AMYLASE in the  last 8760 hours.  Recent Labs  12/01/15 0047  AMMONIA 11   CBC:  Recent Labs  11/30/15 1850  12/01/15 1215 12/02/15 0459 12/02/15 1624 12/03/15 12/03/15 0028 12/04/15 0300  WBC 6.7  < > 3.6  3.6* 4.3  4.3 5.8  5.8 6.5 6.5 7.0  NEUTROABS 4.5  --  2.6 2.9  --   --   --   --   HGB 8.4*  < > 8.9*  8.9* 7.6*  7.6* 8.2*  8.2* 8.1* 8.1* 9.0*  HCT 27.9*  < > 29.4*  29* 25.4*  25* 26.5*  27* 26* 26.3* 29.0*  MCV 94.6  < > 95.5 93.4 92.7  --  92.9 93.2  PLT 227  < > 182  182 172  172 181  181 189 189 195  < > = values in this interval not displayed. Lipid  Recent Labs  12/01/15 0644  CHOL 107  HDL 27*  LDLCALC 59  TRIG 107   Cardiac Enzymes:  Recent Labs  12/01/15 0639 12/01/15 1215 12/01/15 2012  TROPONINI <0.03 <0.03 <0.03   BNP: No results for input(s): BNP in the last 8760 hours. CBG:  Recent Labs  11/30/15 1911  GLUCAP 79    Procedures and Imaging Studies During Stay: Ct Abdomen Pelvis Wo Contrast  Result Date: 11/17/2015 CLINICAL DATA:  Possible diverticular abscess EXAM: CT ABDOMEN AND PELVIS WITHOUT CONTRAST TECHNIQUE: Multidetector CT imaging of the abdomen and pelvis was performed following the standard protocol without IV contrast. COMPARISON:  11/16/2015 FINDINGS: Lower chest: Mild emphysematous changes are noted. No sizable effusion is seen. Hepatobiliary: The gallbladder is decompressed. The liver is within normal limits. Pancreas: Unremarkable. No pancreatic ductal dilatation or surrounding inflammatory changes. Spleen: Normal in size without focal abnormality. Adrenals/Urinary Tract: Adrenals are within normal limits. Stable renal cysts are noted bilaterally. The bladder is partially decompressed. Stomach/Bowel: Scattered diverticular change of the colon is noted. Some sigmoid wall thickening is seen. The mottled air collection is again identified just anterior to the rectum and slightly eccentric to the left. The contrast load in this region  is not appreciable. Again this may represent a redundant loop of sigmoid although the possibility of an extraluminal collection remains. Vascular/Lymphatic: Aortic atherosclerosis. No enlarged abdominal or pelvic lymph nodes. Reproductive: Left ovarian cyst is again seen. History ectomy is been previously performed. Other: No abdominal wall hernia or abnormality. No abdominopelvic ascites. Musculoskeletal: No acute or significant osseous findings. IMPRESSION: Stable changes in the region of the rectosigmoid. This may simply represent redundant loop of colon although imaging was performed prior to contrast reaching the distal colon. The overall appearance is stable from the prior study. Electronically Signed   By: Inez Catalina M.D.   On: 11/17/2015 19:48   Ct Abdomen Pelvis Wo Contrast  Addendum Date: 11/16/2015   ADDENDUM REPORT: 11/16/2015 16:51 ADDENDUM: These results were called by telephone at the time of interpretation on 11/16/2015 at 4:51 pm to Dr. Oren Binet , who verbally acknowledged these results. Electronically Signed   By: Marin Olp M.D.   On: 11/16/2015 16:51   Result Date: 11/16/2015 CLINICAL DATA:  Fever overnight. EXAM: CT CHEST, ABDOMEN AND PELVIS WITHOUT CONTRAST TECHNIQUE: Multidetector CT imaging of the chest, abdomen and pelvis was performed following the standard protocol without IV  contrast. COMPARISON:  Chest CT 03/19/2009 FINDINGS: CT CHEST FINDINGS Cardiovascular: Mild cardiomegaly. Calcified plaque over the left anterior descending and lateral circumflex coronary arteries. Calcified plaque over the thoracic aorta. Ascending thoracic aorta measures 3.4 cm in AP diameter. Mediastinum/Nodes: No evidence of mediastinal or hilar adenopathy. Remaining mediastinal structures are within normal. Lungs/Pleura: Lungs are well inflated demonstrate mild to moderate centrilobular emphysematous disease. There is mild bibasilar fibrotic change which has progressed compared to the previous  exam. Tiny amount of left pleural fluid. Mild right base atelectasis. Airways within normal. Musculoskeletal: Degenerative change of the spine. CT ABDOMEN PELVIS FINDINGS Hepatobiliary: Gallbladder is contracted.  Liver is within normal. Pancreas: Within normal. Spleen: Within normal. Adrenals/Urinary Tract: Adrenal glands are within normal. Kidneys normal size with multiple bilateral cysts with the largest over the lower pole left kidney measuring 6.6 cm. Ureters are within normal. Bladder demonstrates mild circumferential wall thickening which may be due to underdistention although can be seen with cystitis. Stomach/Bowel: Stomach is within normal. Small bowel is normal. Appendix is normal. Mild fecal retention throughout the colon. There is wall thickening of the sigmoid colon as it courses by the left adnexa. Minimal stranding of the adjacent pericolonic fat. There is a collection of air and mottled lucency which appears to be abutting the left lateral side of the rectosigmoid colon measuring approximately 3.5 x 4.5 cm as this may represent a redundant loop of rectosigmoid colon versus a contained perforation of the rectosigmoid colon. There is no free peritoneal air visualized. Few small lymph nodes in the left perirectal region. These findings could be due to a chronic diverticulitis with possible contain perforation versus contain perforation of rectosigmoid carcinoma with secondary inflammatory change. Vascular/Lymphatic: Moderate calcified plaque over the abdominal aorta. Reproductive: Suggestion of previous hysterectomy. 3.6 cm oval left adnexal cyst likely ovarian cyst. Right ovary within normal. Musculoskeletal: Moderate degenerative changes of the spine and mild degenerate change of the hips. Mild biphasic curvature of the spine. IMPRESSION: No acute findings in the chest. Moderate emphysematous disease with mild bibasilar fibrotic change demonstrating interval progression. Wall thickening of the  rectosigmoid colon with mild adjacent inflammatory change and minimal associated adjacent small lymph nodes. Collection of air mottled lucency along the left lateral aspect of the rectosigmoid colon measuring 3.5 x 4.5 cm. This collection may represent a redundant rectosigmoid loop versus an extraluminal contained perforation either from colon carcinoma or chronic diverticulitis. Consider delayed images as better colonic contrast may help in interpretation of this finding. Mild cardiomegaly and evidence of atherosclerotic coronary artery disease. Aortic atherosclerosis. Bilateral renal cysts with the largest measuring 6.6 cm over the lower pole left kidney. Next Possible 3.6 cm left ovarian cyst. Electronically Signed: By: Marin Olp M.D. On: 11/16/2015 16:42   Dg Chest 1 View  Result Date: 11/30/2015 CLINICAL DATA:  Status post fall, with concern for chest injury. Initial encounter. EXAM: CHEST 1 VIEW COMPARISON:  Chest radiograph performed 11/10/2015, and CT of the chest performed 11/16/2015 FINDINGS: The lungs are hyperexpanded, with flattening of the hemidiaphragms, compatible with COPD. Mild bilateral atelectasis is seen. There is no evidence of pleural effusion or pneumothorax. The cardiomediastinal silhouette is enlarged. No acute osseous abnormalities are seen. Chronic right-sided rib deformities are noted. IMPRESSION: 1. No displaced rib fracture seen. 2. Findings of COPD, with mild bilateral atelectasis. 3. Cardiomegaly. Electronically Signed   By: Garald Balding M.D.   On: 11/30/2015 21:25   Dg Chest 2 View  Result Date: 12/03/2015 CLINICAL DATA:  Fever  EXAM: CHEST  2 VIEW COMPARISON:  11/30/2015 FINDINGS: Lungs appear slightly hyperinflated. Diffuse coarsening of the lung interstitium suspected to be chronic. No acute consolidation or pleural effusion is visualized. Moderate cardiomegaly without overt failure. No pneumothorax. Old right clavicular fracture. Multiple old right rib fractures.  IMPRESSION: 1. Mild to moderate cardiomegaly without overt failure 2. Diffuse coarsening of the lung interstitium suspected to be due to chronic change. No acute consolidations. Electronically Signed   By: Donavan Foil M.D.   On: 12/03/2015 19:41   Dg Lumbar Spine Complete  Result Date: 11/30/2015 CLINICAL DATA:  Status post fall, with lower back pain. Initial encounter. EXAM: LUMBAR SPINE - COMPLETE 4+ VIEW COMPARISON:  CT of the abdomen and pelvis performed 11/17/2015 FINDINGS: There is no evidence of fracture or subluxation. Vertebral bodies demonstrate normal height. Mild left convex thoracolumbar scoliosis is noted. Intervertebral disc spaces are preserved. Facet disease is noted along the lumbar spine. The visualized bowel gas pattern is unremarkable in appearance; air and stool are noted within the colon. The sacroiliac joints are within normal limits. Diffuse calcification is seen along the abdominal aorta acute IMPRESSION: 1. No evidence of fracture or subluxation along the lumbar spine. 2. Mild left convex thoracolumbar scoliosis, with mild degenerative change. 3. Diffuse aortic atherosclerosis. Electronically Signed   By: Garald Balding M.D.   On: 11/30/2015 21:23   Ct Chest Wo Contrast  Addendum Date: 11/16/2015   ADDENDUM REPORT: 11/16/2015 16:51 ADDENDUM: These results were called by telephone at the time of interpretation on 11/16/2015 at 4:51 pm to Dr. Oren Binet , who verbally acknowledged these results. Electronically Signed   By: Marin Olp M.D.   On: 11/16/2015 16:51   Result Date: 11/16/2015 CLINICAL DATA:  Fever overnight. EXAM: CT CHEST, ABDOMEN AND PELVIS WITHOUT CONTRAST TECHNIQUE: Multidetector CT imaging of the chest, abdomen and pelvis was performed following the standard protocol without IV contrast. COMPARISON:  Chest CT 03/19/2009 FINDINGS: CT CHEST FINDINGS Cardiovascular: Mild cardiomegaly. Calcified plaque over the left anterior descending and lateral circumflex  coronary arteries. Calcified plaque over the thoracic aorta. Ascending thoracic aorta measures 3.4 cm in AP diameter. Mediastinum/Nodes: No evidence of mediastinal or hilar adenopathy. Remaining mediastinal structures are within normal. Lungs/Pleura: Lungs are well inflated demonstrate mild to moderate centrilobular emphysematous disease. There is mild bibasilar fibrotic change which has progressed compared to the previous exam. Tiny amount of left pleural fluid. Mild right base atelectasis. Airways within normal. Musculoskeletal: Degenerative change of the spine. CT ABDOMEN PELVIS FINDINGS Hepatobiliary: Gallbladder is contracted.  Liver is within normal. Pancreas: Within normal. Spleen: Within normal. Adrenals/Urinary Tract: Adrenal glands are within normal. Kidneys normal size with multiple bilateral cysts with the largest over the lower pole left kidney measuring 6.6 cm. Ureters are within normal. Bladder demonstrates mild circumferential wall thickening which may be due to underdistention although can be seen with cystitis. Stomach/Bowel: Stomach is within normal. Small bowel is normal. Appendix is normal. Mild fecal retention throughout the colon. There is wall thickening of the sigmoid colon as it courses by the left adnexa. Minimal stranding of the adjacent pericolonic fat. There is a collection of air and mottled lucency which appears to be abutting the left lateral side of the rectosigmoid colon measuring approximately 3.5 x 4.5 cm as this may represent a redundant loop of rectosigmoid colon versus a contained perforation of the rectosigmoid colon. There is no free peritoneal air visualized. Few small lymph nodes in the left perirectal region. These findings could be  due to a chronic diverticulitis with possible contain perforation versus contain perforation of rectosigmoid carcinoma with secondary inflammatory change. Vascular/Lymphatic: Moderate calcified plaque over the abdominal aorta. Reproductive:  Suggestion of previous hysterectomy. 3.6 cm oval left adnexal cyst likely ovarian cyst. Right ovary within normal. Musculoskeletal: Moderate degenerative changes of the spine and mild degenerate change of the hips. Mild biphasic curvature of the spine. IMPRESSION: No acute findings in the chest. Moderate emphysematous disease with mild bibasilar fibrotic change demonstrating interval progression. Wall thickening of the rectosigmoid colon with mild adjacent inflammatory change and minimal associated adjacent small lymph nodes. Collection of air mottled lucency along the left lateral aspect of the rectosigmoid colon measuring 3.5 x 4.5 cm. This collection may represent a redundant rectosigmoid loop versus an extraluminal contained perforation either from colon carcinoma or chronic diverticulitis. Consider delayed images as better colonic contrast may help in interpretation of this finding. Mild cardiomegaly and evidence of atherosclerotic coronary artery disease. Aortic atherosclerosis. Bilateral renal cysts with the largest measuring 6.6 cm over the lower pole left kidney. Next Possible 3.6 cm left ovarian cyst. Electronically Signed: By: Marin Olp M.D. On: 11/16/2015 16:42   Ct Cervical Spine Wo Contrast  Result Date: 11/30/2015 CLINICAL DATA:  Found unresponsive on floor at rehabilitation center. Weakness. History of spinal stenosis. EXAM: CT CERVICAL SPINE WITHOUT CONTRAST TECHNIQUE: Multidetector CT imaging of the cervical spine was performed without intravenous contrast. Multiplanar CT image reconstructions were also generated. COMPARISON:  None. FINDINGS: ALIGNMENT: Broad reversed lordosis. Minimal grade 1 C3-4 anterolisthesis off spondylolysis. SKULL BASE AND VERTEBRAE: Cervical vertebral bodies and posterior elements are intact. Severe C4-5, C5-6 disc height loss, moderate to severe at C3-4 and C6-7 associated with uncovertebral hypertrophy, subchondral cysts and endplate spurring compatible with  degenerative discs. RIGHT C2-3 facets are fused on degenerative basis. Multilevel moderate to severe facet arthropathy. No destructive bony lesions. C1-2 articulation maintained. Calcified craniocervical ligaments. SOFT TISSUES AND SPINAL CANAL: Soft tissues are nonacute. Superficial 23 x 8 mm probable sebaceous cyst C3-4 paraspinal soft tissues. Mild calcific atherosclerosis of the carotid bifurcations. DISC LEVELS: No significant osseous canal stenosis. Severe LEFT C3-4, moderate to severe bilateral C4-5, severe bilateral C5-6 and moderate to severe LEFT C6-7 neural foraminal narrowing. UPPER CHEST: Lung apices are clear. OTHER: None. IMPRESSION: No acute fracture. Minimal C3-4 anterolisthesis on degenerative basis. Multilevel severe and moderate to severe neural foraminal narrowing. Electronically Signed   By: Elon Alas M.D.   On: 11/30/2015 20:38   Mr Jodene Nam Head Wo Contrast  Result Date: 11/30/2015 CLINICAL DATA:  Altered mental status after fall. EXAM: MRI HEAD WITHOUT CONTRAST MRA HEAD WITHOUT CONTRAST TECHNIQUE: Multiplanar, multiecho pulse sequences of the brain and surrounding structures were obtained without intravenous contrast. Angiographic images of the head were obtained using MRA technique without contrast. COMPARISON:  Head CT same day FINDINGS: MRI HEAD FINDINGS Brain: There is a punctate focus of diffusion restriction within the right frontal white matter at the base of the precentral gyrus. No other evidence of ischemia. No acute hemorrhage. The midline structures are normal. There is beginning confluent hyperintense T2-weighted signal within the periventricular and deep white matter, most often seen in the setting of chronic microvascular ischemia. No mass lesion or midline shift. No hydrocephalus or extra-axial fluid collection. No age advanced or lobar predominant atrophy. Vascular: Major intracranial arterial and venous sinus flow voids are preserved. No evidence of chronic  microhemorrhage or amyloid angiopathy. Skull and upper cervical spine: The visualized skull base, calvarium, upper cervical  spine and extracranial soft tissues are normal. Sinuses/Orbits: Left maxillary retention cyst. Normal orbits. MRA HEAD FINDINGS Intracranial internal carotid arteries: Normal. Anterior cerebral arteries: Normal. Middle cerebral arteries: Normal. Posterior communicating arteries: Present on the right. Posterior cerebral arteries: Normal. Basilar artery: Normal. Vertebral arteries: Left dominant. Normal. Superior cerebellar arteries: Normal. Anterior inferior cerebellar arteries: Normal. Posterior inferior cerebellar arteries: Normal on the left. Not identified on the right. Variable MRA appearance of the posterior inferior cerebellar arteries is not uncommon. IMPRESSION: 1. Punctate focus of acute ischemia within the right frontal white matter at the base of the precentral gyrus. No hemorrhage or mass effect. 2. Chronic microvascular ischemia. 3. Normal MRA of the circle of Willis and intracranial arteries. Electronically Signed   By: Ulyses Jarred M.D.   On: 11/30/2015 23:31   Mr Brain Wo Contrast  Result Date: 11/30/2015 CLINICAL DATA:  Altered mental status after fall. EXAM: MRI HEAD WITHOUT CONTRAST MRA HEAD WITHOUT CONTRAST TECHNIQUE: Multiplanar, multiecho pulse sequences of the brain and surrounding structures were obtained without intravenous contrast. Angiographic images of the head were obtained using MRA technique without contrast. COMPARISON:  Head CT same day FINDINGS: MRI HEAD FINDINGS Brain: There is a punctate focus of diffusion restriction within the right frontal white matter at the base of the precentral gyrus. No other evidence of ischemia. No acute hemorrhage. The midline structures are normal. There is beginning confluent hyperintense T2-weighted signal within the periventricular and deep white matter, most often seen in the setting of chronic microvascular ischemia.  No mass lesion or midline shift. No hydrocephalus or extra-axial fluid collection. No age advanced or lobar predominant atrophy. Vascular: Major intracranial arterial and venous sinus flow voids are preserved. No evidence of chronic microhemorrhage or amyloid angiopathy. Skull and upper cervical spine: The visualized skull base, calvarium, upper cervical spine and extracranial soft tissues are normal. Sinuses/Orbits: Left maxillary retention cyst. Normal orbits. MRA HEAD FINDINGS Intracranial internal carotid arteries: Normal. Anterior cerebral arteries: Normal. Middle cerebral arteries: Normal. Posterior communicating arteries: Present on the right. Posterior cerebral arteries: Normal. Basilar artery: Normal. Vertebral arteries: Left dominant. Normal. Superior cerebellar arteries: Normal. Anterior inferior cerebellar arteries: Normal. Posterior inferior cerebellar arteries: Normal on the left. Not identified on the right. Variable MRA appearance of the posterior inferior cerebellar arteries is not uncommon. IMPRESSION: 1. Punctate focus of acute ischemia within the right frontal white matter at the base of the precentral gyrus. No hemorrhage or mass effect. 2. Chronic microvascular ischemia. 3. Normal MRA of the circle of Willis and intracranial arteries. Electronically Signed   By: Ulyses Jarred M.D.   On: 11/30/2015 23:31   Dg Hips Bilat W Or Wo Pelvis 3-4 Views  Result Date: 11/30/2015 CLINICAL DATA:  Status post fall. Concern for pelvic injury. Initial encounter. EXAM: DG HIP (WITH OR WITHOUT PELVIS) 3-4V BILAT COMPARISON:  None. FINDINGS: There is no evidence of fracture or dislocation. Both femoral heads are seated normally within their respective acetabula. The proximal femurs appear intact bilaterally. No significant degenerative change is appreciated. The sacroiliac joints are unremarkable in appearance. The visualized bowel gas pattern is grossly unremarkable in appearance. IMPRESSION: No evidence of  fracture or dislocation. Electronically Signed   By: Garald Balding M.D.   On: 11/30/2015 21:21   Ct Head Code Stroke W/o Cm  Result Date: 11/30/2015 CLINICAL DATA:  Code stroke. LEFT-sided weakness and slurred speech. Found on floor unresponsive . Last seen normal at 1500 hours. History of urinary tract infection. EXAM: CT HEAD WITHOUT  CONTRAST TECHNIQUE: Contiguous axial images were obtained from the base of the skull through the vertex without intravenous contrast. COMPARISON:  CT HEAD November 10, 2015 FINDINGS: BRAIN: The ventricles and sulci are normal for age. No intraparenchymal hemorrhage, mass effect nor midline shift. Patchy supratentorial white matter hypodensities within normal range for patient's age, though non-specific are most compatible with chronic small vessel ischemic disease. No acute large vascular territory infarcts. No abnormal extra-axial fluid collections. Basal cisterns are patent. VASCULAR: Mild to moderate calcific atherosclerosis of the carotid siphons. SKULL: No skull fracture. Moderate RIGHT temporomandibular osteoarthrosis. No significant scalp soft tissue swelling. SINUSES/ORBITS: Small LEFT maxillary sinus air-fluid level with frothy secretions, improved. Mild paranasal sinus mucosal thickening. Mastoid air cells are well aerated. Soft tissue within the external auditory canals compatible with cerumen. The included ocular globes and orbital contents are non-suspicious. 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 ASPECTS [facility] Stroke Program Early CT Score) - Ganglionic level infarction (caudate, lentiform nuclei, internal capsule, insula, M1-M3 cortex): 7 - Supraganglionic infarction (M4-M6 cortex): 3 Total score (0-10 with 10 being normal): 10 IMPRESSION: 1. No acute intracranial process ; negative CT HEAD for age. 2. ASPECTS is 10. Critical Value/emergent results were called by telephone at the time of interpretation on 11/30/2015 at 7:21 pm to Dr.  Leonel Ramsay, Neurology, who verbally acknowledged these results. Electronically Signed   By: Elon Alas M.D.   On: 11/30/2015 19:22    Assessment/Plan:   Acute encephalopathy  Chronic pain syndrome  Anxiety and depression  Anemia, unspecified type  Alteration consciousness  Depression, unspecified depression type  Protein-calorie malnutrition, severe  CKD (chronic kidney disease) stage 3, GFR 30-59 ml/min  Hyperthyroidism  Chronic obstructive pulmonary disease, unspecified COPD type ([place])  Essential hypertension   Patient is being discharged with the following home health services:  HH/OT/PT/ Nursing  Patient is being discharged with the following durable medical equipment:  none  Patient has been advised to f/u with their PCP in 1-2 weeks to bring them up to date on their rehab stay.  Social services at facility was responsible for arranging this appointment.  Pt was provided with a 30 day supply of prescriptions for medications and refills must be obtained from their PCP.  For controlled substances, a more limited supply may be provided adequate until PCP appointment only.   Time spent > 30 min;> 50% of time with patient was spent reviewing records, labs, tests and studies, counseling and developing plan of care  Noah Delaine. Sheppard Coil, MD

## 2015-12-12 ENCOUNTER — Encounter: Payer: Self-pay | Admitting: Internal Medicine

## 2015-12-12 DIAGNOSIS — Z8659 Personal history of other mental and behavioral disorders: Secondary | ICD-10-CM | POA: Insufficient documentation

## 2015-12-13 NOTE — Progress Notes (Signed)
Labs and instructions faxed to nursing home but she is going home on Monday from there.  Need to let her daughter know that the methimazole dose is going to be 10 mg twice a day now

## 2015-12-18 ENCOUNTER — Encounter: Payer: Self-pay | Admitting: Interventional Cardiology

## 2015-12-24 ENCOUNTER — Encounter (INDEPENDENT_AMBULATORY_CARE_PROVIDER_SITE_OTHER): Payer: Self-pay

## 2015-12-24 ENCOUNTER — Encounter: Payer: Self-pay | Admitting: Interventional Cardiology

## 2015-12-24 ENCOUNTER — Ambulatory Visit (INDEPENDENT_AMBULATORY_CARE_PROVIDER_SITE_OTHER): Payer: Medicare Other | Admitting: Interventional Cardiology

## 2015-12-24 VITALS — BP 112/70 | HR 76 | Ht 68.0 in | Wt 124.8 lb

## 2015-12-24 DIAGNOSIS — I471 Supraventricular tachycardia: Secondary | ICD-10-CM

## 2015-12-24 DIAGNOSIS — I491 Atrial premature depolarization: Secondary | ICD-10-CM | POA: Diagnosis not present

## 2015-12-24 DIAGNOSIS — J449 Chronic obstructive pulmonary disease, unspecified: Secondary | ICD-10-CM

## 2015-12-24 DIAGNOSIS — I10 Essential (primary) hypertension: Secondary | ICD-10-CM | POA: Diagnosis not present

## 2015-12-24 NOTE — Patient Instructions (Signed)
Medication Instructions:  None  Labwork: None  Testing/Procedures: Your physician has recommended that you wear an event monitor. Event monitors are medical devices that record the heart's electrical activity. Doctors most often [place] these monitors to diagnose arrhythmias. Arrhythmias are problems with the speed or rhythm of the heartbeat. The monitor is a small, portable device. You can wear one while you do your normal daily activities. This is usually used to diagnose what is causing palpitations/syncope (passing out).   Follow-Up: Your physician recommends that you schedule a follow-up appointment as needed with Dr. Tamala Julian.   Any Other Special Instructions Will Be Listed Below (If Applicable).     If you need a refill on your cardiac medications before your next appointment, please call your pharmacy.

## 2015-12-24 NOTE — Progress Notes (Signed)
Cardiology Office Note    Date:  12/24/2015   ID:  Dawn [name], DOB August 11, 1940, MRN [id number]  PCP:  [name], MD  Cardiologist: Sinclair Grooms, MD   Chief Complaint  Patient presents with  . Tachycardia    History of Present Illness:  Dawn [name] is a 75 y.o. female with COPD, anxiety, depression, cognitive impairment, and recent hospitalization where she was noted to have bouts of SVT. She is referred for cardiology follow-up. She was started on beta blocker therapy while hospitalized.  She complains that she has racing heart at night when she tries to sleep. She cannot give any details concerning how long it lasts, how frequent the episodes are, or whether she has any associated symptoms.  Otherwise in no cardiac complaints. She is accompanied by her daughter who has multiple tangential questions and concerns. There is no prior history of heart disease.    Past Medical History:  Diagnosis Date  . COPD (chronic obstructive pulmonary disease) ([place])   . History of anxiety   . History of depression   . Spinal stenosis   . UTI (urinary tract infection)     Past Surgical History:  Procedure Laterality Date  . FLEXIBLE SIGMOIDOSCOPY Left 11/18/2015   Procedure: FLEXIBLE SIGMOIDOSCOPY;  Surgeon: [name], MD;  Location: [facility];  Service: Endoscopy;  Laterality: Left;  . FLEXIBLE SIGMOIDOSCOPY N/A 11/20/2015   Procedure: FLEXIBLE SIGMOIDOSCOPY;  Surgeon: [name], MD;  Location: [facility] ENDOSCOPY;  Service: Endoscopy;  Laterality: N/A;  . NO PAST SURGERIES      Current Medications: Outpatient Medications Prior to Visit  Medication Sig Dispense Refill  . ALPRAZolam (XANAX) 0.5 MG tablet Take 1 tablet (0.5 mg total) by mouth 2 (two) times daily as needed for anxiety. 20 tablet 0  . buPROPion (WELLBUTRIN XL) 150 MG 24 hr tablet Take 150 mg by mouth daily.    Marland Kitchen FERROCITE 324 MG TABS tablet Take 1 tablet (106 mg of iron total) by mouth 2 (two) times  daily after a meal. 30 tablet 2  . INCRUSE ELLIPTA 62.5 MCG/INH AEPB Inhale 1 puff into the lungs daily.  3  . lactose free nutrition (BOOST) LIQD Take 237 mLs by mouth 2 (two) times daily between meals.    . methimazole (TAPAZOLE) 10 MG tablet Take 1.5 tablets (15 mg total) by mouth 2 (two) times daily.  0  . oxyCODONE (OXY IR/ROXICODONE) 5 MG immediate release tablet Take 1 tablet (5 mg total) by mouth every 6 (six) hours as needed for moderate pain. 30 tablet 0  . traZODone (DESYREL) 100 MG tablet Take 100 mg by mouth at bedtime.    Marland Kitchen loperamide (IMODIUM) 2 MG capsule Take 1 capsule (2 mg total) by mouth 2 (two) times daily. (Patient not taking: Reported on 12/24/2015) 30 capsule 0  . metoprolol tartrate (LOPRESSOR) 25 MG tablet Take 1 tablet (25 mg total) by mouth 2 (two) times daily. 60 tablet 0   No facility-administered medications prior to visit.      Allergies:   Patient has no known allergies.   Social History   Social History  . Marital status: Divorced    Spouse name: N/A  . Number of children: N/A  . Years of education: N/A   Social History Main Topics  . Smoking status: Former Research scientist (life sciences)  . Smokeless tobacco: Never Used  . Alcohol use No  . Drug use: No  . Sexual activity: Not Asked   Other Topics  Concern  . None   Social History Narrative  . None     Family History:  The patient's family history includes Diabetes in her father; Heart attack in her mother.   ROS:   Please see the history of present illness.    Decreased memory, weak and frail, pain syndrome.  All other systems reviewed and are negative.   PHYSICAL EXAM:   VS:  BP 112/70 (BP Location: Right Arm)   Pulse 76   Ht '5\' 8"'$  (1.727 m)   Wt 124 lb 12.8 oz (56.6 kg)   BMI 18.98 kg/m    GEN: Well nourished, well developed, in no acute distress . Frail. HEENT: normal  Neck: no JVD, carotid bruits, or masses Cardiac: RRR; no murmurs, rubs, or gallops,no edema  Respiratory:  clear to auscultation  bilaterally, normal work of breathing GI: soft, nontender, nondistended, + BS MS: no deformity or atrophy  Skin: warm and dry, no rash Neuro:  Alert and Oriented x 3, Strength and sensation are intact Psych: euthymic mood, full affect  Wt Readings from Last 3 Encounters:  12/24/15 124 lb 12.8 oz (56.6 kg)  12/11/15 122 lb (55.3 kg)  12/10/15 122 lb (55.3 kg)      Studies/Labs Reviewed:   EKG:  EKG  Performed 12/01/2015 reveals sinus rhythm with T-wave inversion V1 and V3. Incomplete right bundle branch block.  Recent Labs: 12/01/2015: TSH 0.034 12/02/2015: Magnesium 1.8 12/04/2015: Hemoglobin 9.0; Platelets 195 12/10/2015: ALT 6; BUN 20; Creatinine, Ser 1.19; Potassium 4.3; Sodium 134   Lipid Panel    Component Value Date/Time   CHOL 107 12/01/2015 0644   TRIG 107 12/01/2015 0644   HDL 27 (L) 12/01/2015 0644   CHOLHDL 4.0 12/01/2015 0644   VLDL 21 12/01/2015 0644   LDLCALC 59 12/01/2015 0644    Additional studies/ records that were reviewed today include:  CT scan performed 11/16/15 reveals coronary calcification.  Echocardiogram 12/02/15 demonstrated:  Study Conclusions  - Left ventricle: Wall thickness was increased in a pattern of mild   LVH. Systolic function was normal. The estimated ejection   fraction was in the range of 50% to 55%. Wall motion was normal;   there were no regional wall motion abnormalities. Features are   consistent with a pseudonormal left ventricular filling pattern,   with concomitant abnormal relaxation and increased filling   pressure (grade 2 diastolic dysfunction). - Aortic valve: There was mild to moderate regurgitation. - Mitral valve: There was mild regurgitation. - Left atrium: The atrium was moderately dilated. - Right atrium: The atrium was mildly dilated. - Pulmonary arteries: Systolic pressure was moderately increased.   PA peak pressure: 50 mm Hg (S).  ASSESSMENT:    1. SVT (supraventricular tachycardia) ([place])   2.  Premature atrial contractions   3. Essential hypertension   4. Chronic obstructive pulmonary disease, unspecified COPD type ([place])      PLAN:  In order of problems listed above:  1. Identified during hospitalization. She is now post hospital stay and is unable to give a history that is reliable. Thirty-day continuous monitor will be performed to exclude atrial fibrillation which would be important to know to exclude the possibility of potential stroke risk. 2. Will be addressed with a 30 day monitor. 3. Excellent blood pressure control at this time. 4. Not addressed.  Clinical follow-up  Medication Adjustments/Labs and Tests Ordered: Current medicines are reviewed at length with the patient today.  Concerns regarding medicines are outlined above.  Medication changes,  Labs and Tests ordered today are listed in the Patient Instructions below. There are no Patient Instructions on file for this visit.   Signed, Sinclair Grooms, MD  12/24/2015 9:50 AM    [address], [place],   [address] Phone: [phone]; Fax: [phone]

## 2015-12-28 ENCOUNTER — Other Ambulatory Visit: Payer: Medicare Other

## 2015-12-31 ENCOUNTER — Encounter: Payer: Self-pay | Admitting: Endocrinology

## 2015-12-31 ENCOUNTER — Ambulatory Visit (INDEPENDENT_AMBULATORY_CARE_PROVIDER_SITE_OTHER): Payer: Medicare Other

## 2015-12-31 ENCOUNTER — Ambulatory Visit (INDEPENDENT_AMBULATORY_CARE_PROVIDER_SITE_OTHER): Payer: Medicare Other | Admitting: Endocrinology

## 2015-12-31 VITALS — BP 102/60 | HR 83 | Ht 68.0 in | Wt 122.0 lb

## 2015-12-31 DIAGNOSIS — E0590 Thyrotoxicosis, unspecified without thyrotoxic crisis or storm: Secondary | ICD-10-CM | POA: Diagnosis not present

## 2015-12-31 DIAGNOSIS — I471 Supraventricular tachycardia: Secondary | ICD-10-CM

## 2015-12-31 NOTE — Patient Instructions (Signed)
What is hyperthyroidism?  Hyperthyroidism develops when the body is exposed to excessive amounts of thyroid hormone. This disorder occurs in almost one percent of all Americans and affects women five to 10 times more often than men. In its mildest form, hyperthyroidism may not cause recognizable symptoms. More often, however, the symptoms are discomforting, disabling or even life-threatening.  What are the causes of hyperthyroidism?  Berenice Primas' disease: Graves' disease (named after [place] physician Raylene Everts) is an autoimmune disorder that frequently results in thyroid enlargement and hyperthyroidism. In some patients, swelling of the muscles and other tissues around the eyes may develop, causing eye prominence, discomfort or double vision. Like other autoimmune diseases, this condition tends to affect multiple family members. It is much more common in women than in men and tends to occur in younger patients. .   . Silent thyroiditis: Transient (temporary) hyperthyroidism can be caused by silent thyroiditis, a condition which appears to be the same as postpartum thyroiditis, but is not related to pregnancy. It is not accompanied by a painful thyroid gland. . Subacute thyroiditis: This condition may follow a viral infection and is characterized by painful thyroid gland enlargement and inflammation, which results in the release of large amounts of thyroid hormones into the blood. Fortunately, this condition usually resolves spontaneously. The thyroid usually heals itself over several months, but often not before a temporary period of low thyroid hormone production (hypothyroidism) occurs. . Toxic multinodular goiter: Multiple nodules in the thyroid can produce excessive thyroid hormone, causing hyperthyroidism. Typically diagnosed in patients over the age of 36, this disorder is more likely to affect heart rhythm. In many cases, the person has had the goiter for many years before it becomes  overactive. . Toxic nodule: A single nodule or lump in the thyroid can also produce more thyroid hormone than the body requires and lead to hyperthyroidism. This disorder is not familial.    What are the signs and symptoms of hyperthyroidism? When hyperthyroidism develops, a goiter (enlargement of the thyroid) is usually present and may be associated with some or many of the following features: . Fast heart rate, often more than 100 beats per minute . Becoming anxious, irritable, argumentative . Trembling hands . Weight loss, despite eating the same amount or even more than usual . Intolerance of warm temperatures and increased likelihood to perspire . Loss of scalp hair . Tendency of fingernails to separate from the nail bed . Muscle weakness, especially of the upper arms and thighs . Loose and frequent bowel movements . Smooth skin . Change in menstrual pattern . Increased likelihood for miscarriage . Prominent "stare" of the eyes . Protrusion of the eyes, with or without double vision (in patients with Graves' disease) . Irregular heart rhythm, especially in patients older than 76 years of age . Accelerated loss of calcium from bones, which increases the risk of osteoporosis and fractures   How is hyperthyroidism diagnosed? Sometimes a general physician can diagnose and treat the cause of hyperthyroidism, but assistance is often needed from an endocrinologist, a physician who specializes in managing thyroid disease. Characteristic symptoms and physical signs of the disease can be detected by a trained physician. In addition, tests can be used to confirm the diagnosis and to determine the cause.  Tests TSH (THYROID-STIMULATING HORMONE OR THYROTROPIN): A low TSH level in the blood is the most accurate indicator of hyperthyroidism. The body shuts off production of this pituitary hormone when the thyroid gland even slightly overproduces thyroid hormone. If the  TSH level is low, it is very  important to also check thyroid hormone levels to confirm the diagnosis of hyperthyroidism.  ESTIMATES OF FREE THYROXINE AND FREE TRIIODOTHYRONINE: When hyperthyroidism develops, free thyroxine and free triiodothyronine levels rise above previous values in that specific patient (although they may still fall within the normal range for the general population) and are often considerably elevated. TSI (THYROID-STIMULATING IMMUNOGLOBULIN): A substance often found in the blood when Graves' disease is the cause of hyperthyroidism. RADIOACTIVE IODINE UPTAKE (RAIU): The amount of iodine the thyroid gland can collect, and a thyroid scan, which shows how the iodine is distributed throughout the thyroid gland.  THYROID SCAN: This information can be useful in determining the cause of hyperthyroidism and, ultimately, its treatment.   How is hyperthyroidism treated? Appropriate management of hyperthyroidism requires careful evaluation and ongoing care by a physician experienced in the treatment of this complex condition. Before the development of current treatment options, the death rate from severe hyperthyroidism was as high as 50 percent. Now several effective treatments are available and, with proper management, death from hyperthyroidism is rare. Deciding which treatment is best depends on what caused the hyperthyroidism, its severity and other conditions present.   . Antithyroid Drugs In the [place], two drugs are available for treating hyperthyroidism: propylthiouracil (PTU) and methimazole (Tapazole). Except for early pregnancy, methimazole is preferred because PTU can cause fatal liver damage, although rarely. These medications control hyperthyroidism by slowing thyroid hormone production. They may take several months to normalize thyroid hormone levels. Some patients with hyperthyroidism caused by Graves' disease experience a spontaneous or natural remission of hyperthyroidism after a 12- to 24-month course of treatment with these drugs and may sometimes avoid permanent underactivity of the thyroid (hypothyroidism), which often occurs as a result of using the other methods of treating hyperthyroidism. Unfortunately, the remission is frequently only temporary, with the hyperthyroidism recurring after several months or years off medication and requiring additional treatment, so relatively few patients are treated solely with antithyroid medication in the [place] Antithyroid drugs may cause an allergic reaction in about five percent of patients who use them. This usually occurs during the first six weeks of drug treatment. Such a reaction may include rash or hives; but after discontinuing use of the drug, the symptoms resolve within one to two weeks and there is no permanent damage. A more serious side effect, but occurring in only about one in 250-500 patients during the first four to eight weeks of treatment, is a rapid decrease of white blood cells in the bloodstream. This could increase susceptibility to serious infection. Symptoms such as a sore throat, infection or fever should be reported promptly to your physician, and a white blood cell count should be done immediately. In nearly every case, when a person stops using the medication, the white blood cell count returns to normal. Very rarely, antithyroid drugs may cause severe liver problems, which can be detected by monitoring blood tests or joint problems characterized by joint pain and/or swelling. Your physician should be contacted if there is yellowing of the skin (jaundice), fever, loss of appetite or abdominal pain.  . Radioactive Iodine Treatment Iodine is an essential ingredient in the production of thyroid hormone. Each molecule of thyroid hormone contains either four (T4) or three (T3) molecules of iodine. Since most overactive thyroid glands are quite hungry for iodine, it was discovered in the 1940s that the thyroid could be  "tricked" into destroying itself by simply feeding it radioactive  iodine. The radioactive iodine is given by mouth, usually in capsule form, and is quickly absorbed from the bowel. It then enters the thyroid cells from the bloodstream and gradually destroys them. Maximal benefit is usually noted within three to six months. It is not possible to eliminate "just the right amount" of the diseased thyroid gland, since radioiodine eventually damages all thyroid cells. Therefore, most endocrinologists strive to completely destroy the diseased thyroid gland with a single dose of radioiodine. This results in the intentional development of an underactive thyroid state (hypothyroidism), which is easily, predictably and inexpensively corrected by lifelong daily use of oral thyroid hormone replacement therapy. Although every effort is made to calculate the correct dose of radioiodine for each patient, not every treatment will successfully correct the hyperthyroidism, particularly if the goiter is quite large and a second dose of radioactive iodine is occasionally needed. Thousands of patients have received radioiodine treatment, including former Software engineer of the [employer] and his wife, Pamala Hurry. The treatment is a very safe, simple and reliably effective one. Because of this, it is considered by most thyroid specialists in the [place] to be the treatment of choice for hyperthyroidism cases caused by overproduction of thyroid hormone. Marland Kitchen

## 2015-12-31 NOTE — Progress Notes (Signed)
Patient ID: Dawn [name], female   DOB: 1940-07-28, 75 y.o.   MRN: [id number]                                                                                                                Reason for Appointment:  Hyperthyroidism, follow-up visit    History of Present Illness:    Thyroid functions were checked in the hospital when she was having diarrhea and episodes of SVT in October 2017 With this she was diagnosed to have Hyperthyroidism. She was likely started on methimazole at the beginning of November   On her initial consultation patient was not able to give a good history but was continuing to have diarrhea Did not complain of palpitations. At her initial consultation her free T4 had come back to normal and free T3 was low normal Because of this her methimazole was reduced from 15 down to 10 mg twice a day  Her daughter is present today help her with her visit and she thinks she is taking the medication as directed Her only complaint is diarrhea Her weight has leveled off Has not had any labs done recently   Wt Readings from Last 3 Encounters:  12/31/15 122 lb (55.3 kg)  12/24/15 124 lb 12.8 oz (56.6 kg)  12/11/15 122 lb (55.3 kg)    She has been evaluated with thyroid function tests  and they showed the following:     Lab Results  Component Value Date   FREET4 1.22 12/10/2015   FREET4 1.44 (H) 12/01/2015   FREET4 2.40 (H) 11/11/2015   T3FREE 2.4 12/10/2015   TSH 0.034 (L) 12/01/2015   TSH 0.289 (L) 11/10/2015   Lab Results  Component Value Date   T3FREE 2.4 12/10/2015       Allergies as of 12/31/2015   No Known Allergies     Medication List       Accurate as of 12/31/15  2:20 PM. Always use your most recent med list.          ALPRAZolam 0.5 MG tablet Commonly known as:  XANAX Take 1 tablet (0.5 mg total) by mouth 2 (two) times daily as needed for anxiety.   buPROPion 150 MG 24 hr tablet Commonly known as:  WELLBUTRIN XL Take 150 mg by  mouth daily.   FERROCITE 324 (106 Fe) MG Tabs tablet Generic drug:  Ferrous Fumarate Take 1 tablet (106 mg of iron total) by mouth 2 (two) times daily after a meal.   INCRUSE ELLIPTA 62.5 MCG/INH Aepb Generic drug:  umeclidinium bromide Inhale 1 puff into the lungs daily.   lactose free nutrition Liqd Take 237 mLs by mouth 2 (two) times daily between meals.   loperamide 2 MG capsule Commonly known as:  IMODIUM Take 4 mg by mouth as needed for diarrhea or loose stools.   methimazole 10 MG tablet Commonly known as:  TAPAZOLE Take 1.5 tablets (15 mg total) by mouth 2 (two) times daily.   metoprolol tartrate 25 MG tablet Commonly known  as:  LOPRESSOR Take 25 mg by mouth 2 (two) times daily.   oxyCODONE 5 MG immediate release tablet Commonly known as:  Oxy IR/ROXICODONE Take 1 tablet (5 mg total) by mouth every 6 (six) hours as needed for moderate pain.   traZODone 100 MG tablet Commonly known as:  DESYREL Take 100 mg by mouth at bedtime.           Past Medical History:  Diagnosis Date  . COPD (chronic obstructive pulmonary disease) ([place])   . History of anxiety   . History of depression   . Spinal stenosis   . UTI (urinary tract infection)     Past Surgical History:  Procedure Laterality Date  . FLEXIBLE SIGMOIDOSCOPY Left 11/18/2015   Procedure: FLEXIBLE SIGMOIDOSCOPY;  Surgeon: [name], MD;  Location: [facility];  Service: Endoscopy;  Laterality: Left;  . FLEXIBLE SIGMOIDOSCOPY N/A 11/20/2015   Procedure: FLEXIBLE SIGMOIDOSCOPY;  Surgeon: [name], MD;  Location: [facility] ENDOSCOPY;  Service: Endoscopy;  Laterality: N/A;  . NO PAST SURGERIES      Family History  Problem Relation Age of Onset  . Heart attack Mother   . Diabetes Father     Social History:  reports that she has quit smoking. She has never used smokeless tobacco. She reports that she does not drink alcohol or use drugs.  Allergies: No Known Allergies  Review of Systems:  Review of Systems    Constitutional: Positive for weight loss. Negative for reduced appetite.  HENT: Negative for trouble swallowing.   Eyes: Negative for blurred vision.  Respiratory: Positive for shortness of breath.   Cardiovascular: Negative for palpitations and leg swelling.  Gastrointestinal: Positive for diarrhea. Negative for abdominal pain.  Endocrine: Positive for fatigue and cold intolerance. Negative for heat intolerance.  Musculoskeletal: Negative for joint pain.  Neurological: Positive for weakness and tremors.  Psychiatric/Behavioral: Positive for nervousness and insomnia.      Examination:   BP 102/60   Pulse 83   Ht '5\' 8"'$  (1.727 m)   Wt 122 lb (55.3 kg)   SpO2 92%   BMI 18.55 kg/m   She looks well, not anxious Neck: The thyroid is not enlarged   Deep tendon reflexes at biceps are slightly brisk Bilateral Coarse tremors of the hands present   Assessment/Plan:   Hyperthyroidism, Likely to be from Graves' disease  She has been on antithyroid drugs since about early November No thyroid enlargement felt Most likely she does have Graves' disease and will need to confirm this with a thyrotropin receptor antibody Discussed etiology of hyperthyroidism with patient and her daughter  Also discussed treatment options including I-131 treatment which may be needed if she has persistent hyperthyroidism or significantly high thyrotropin receptor antibody   Today we will have her get labs to evaluate thyroid function and decide on methimazole dosage and potentially need for I-131 after this   [facility] 12/31/2015, 2:20 PM

## 2016-01-05 ENCOUNTER — Telehealth: Payer: Self-pay | Admitting: *Deleted

## 2016-01-05 NOTE — Telephone Encounter (Signed)
Follow up  Pts daughter calling back to speak with nurse.  Please f/u

## 2016-01-05 NOTE — Telephone Encounter (Signed)
Received monitor strips showing SVT on 12/23.  I placed call to pt and left message to call back

## 2016-01-05 NOTE — Telephone Encounter (Signed)
Left message to call back  

## 2016-01-05 NOTE — Telephone Encounter (Signed)
Additional monitor strips show SVT on 12/22

## 2016-01-05 NOTE — Telephone Encounter (Signed)
Strips reviewed with Dr. Caryl Comes. Strips most consistent with PJRT. Recommend medical treatment or referral for ablation. I spoke with pt's daughter and told her pt should continue same medications.  I asked her to let us know if pt starts feeling palpitations or increased heart rate.

## 2016-01-05 NOTE — Telephone Encounter (Signed)
I spoke with pt's daughter who reports pt did not have any palpitations or other symptoms on 12/22 and 12/23. She is taking lopressor 25 twice daily as listed.

## 2016-01-06 ENCOUNTER — Other Ambulatory Visit (HOSPITAL_COMMUNITY)
Admission: RE | Admit: 2016-01-06 | Discharge: 2016-01-06 | Disposition: A | Discharge status: Home / Self Care | Payer: Medicare Other | Source: Other Acute Inpatient Hospital | Attending: Endocrinology | Admitting: Endocrinology

## 2016-01-06 DIAGNOSIS — E0590 Thyrotoxicosis, unspecified without thyrotoxic crisis or storm: Secondary | ICD-10-CM | POA: Diagnosis present

## 2016-01-06 LAB — T4, FREE: FREE T4: 0.84 ng/dL (ref 0.61–1.12)

## 2016-01-06 LAB — TSH: TSH: 4.936 u[IU]/mL — ABNORMAL HIGH (ref 0.350–4.500)

## 2016-01-07 LAB — THYROTROPIN RECEPTOR AUTOABS

## 2016-01-07 LAB — T3, FREE: T3 FREE: 1.9 pg/mL — AB (ref 2.0–4.4)

## 2016-01-11 ENCOUNTER — Encounter (HOSPITAL_COMMUNITY): Payer: Self-pay | Admitting: Nurse Practitioner

## 2016-01-11 ENCOUNTER — Emergency Department (HOSPITAL_COMMUNITY): Payer: Medicare Other

## 2016-01-11 ENCOUNTER — Inpatient Hospital Stay (HOSPITAL_COMMUNITY)
Admission: EM | Admit: 2016-01-11 | Discharge: 2016-01-15 | DRG: 469 | Disposition: A | Discharge status: Skilled Nursing Facility | Payer: Medicare Other | Attending: Internal Medicine | Admitting: Internal Medicine

## 2016-01-11 DIAGNOSIS — G894 Chronic pain syndrome: Secondary | ICD-10-CM | POA: Diagnosis present

## 2016-01-11 DIAGNOSIS — E43 Unspecified severe protein-calorie malnutrition: Secondary | ICD-10-CM | POA: Diagnosis not present

## 2016-01-11 DIAGNOSIS — S72011A Unspecified intracapsular fracture of right femur, initial encounter for closed fracture: Secondary | ICD-10-CM | POA: Diagnosis not present

## 2016-01-11 DIAGNOSIS — I959 Hypotension, unspecified: Secondary | ICD-10-CM | POA: Diagnosis present

## 2016-01-11 DIAGNOSIS — Z79891 Long term (current) use of opiate analgesic: Secondary | ICD-10-CM

## 2016-01-11 DIAGNOSIS — Z87891 Personal history of nicotine dependence: Secondary | ICD-10-CM

## 2016-01-11 DIAGNOSIS — J449 Chronic obstructive pulmonary disease, unspecified: Secondary | ICD-10-CM | POA: Diagnosis present

## 2016-01-11 DIAGNOSIS — M4800 Spinal stenosis, site unspecified: Secondary | ICD-10-CM | POA: Diagnosis present

## 2016-01-11 DIAGNOSIS — R0902 Hypoxemia: Secondary | ICD-10-CM | POA: Diagnosis present

## 2016-01-11 DIAGNOSIS — M5489 Other dorsalgia: Secondary | ICD-10-CM | POA: Diagnosis not present

## 2016-01-11 DIAGNOSIS — Z7901 Long term (current) use of anticoagulants: Secondary | ICD-10-CM

## 2016-01-11 DIAGNOSIS — I471 Supraventricular tachycardia: Secondary | ICD-10-CM | POA: Diagnosis present

## 2016-01-11 DIAGNOSIS — R140 Abdominal distension (gaseous): Secondary | ICD-10-CM

## 2016-01-11 DIAGNOSIS — Z681 Body mass index (BMI) 19 or less, adult: Secondary | ICD-10-CM

## 2016-01-11 DIAGNOSIS — N183 Chronic kidney disease, stage 3 unspecified: Secondary | ICD-10-CM | POA: Diagnosis present

## 2016-01-11 DIAGNOSIS — S299XXA Unspecified injury of thorax, initial encounter: Secondary | ICD-10-CM | POA: Diagnosis not present

## 2016-01-11 DIAGNOSIS — W19XXXA Unspecified fall, initial encounter: Secondary | ICD-10-CM

## 2016-01-11 DIAGNOSIS — Z23 Encounter for immunization: Secondary | ICD-10-CM | POA: Diagnosis not present

## 2016-01-11 DIAGNOSIS — I129 Hypertensive chronic kidney disease with stage 1 through stage 4 chronic kidney disease, or unspecified chronic kidney disease: Secondary | ICD-10-CM | POA: Diagnosis present

## 2016-01-11 DIAGNOSIS — Z79899 Other long term (current) drug therapy: Secondary | ICD-10-CM

## 2016-01-11 DIAGNOSIS — F329 Major depressive disorder, single episode, unspecified: Secondary | ICD-10-CM | POA: Diagnosis present

## 2016-01-11 DIAGNOSIS — E0590 Thyrotoxicosis, unspecified without thyrotoxic crisis or storm: Secondary | ICD-10-CM | POA: Diagnosis present

## 2016-01-11 DIAGNOSIS — I4891 Unspecified atrial fibrillation: Secondary | ICD-10-CM | POA: Diagnosis not present

## 2016-01-11 DIAGNOSIS — N390 Urinary tract infection, site not specified: Secondary | ICD-10-CM | POA: Diagnosis present

## 2016-01-11 DIAGNOSIS — E1122 Type 2 diabetes mellitus with diabetic chronic kidney disease: Secondary | ICD-10-CM | POA: Diagnosis present

## 2016-01-11 DIAGNOSIS — Z8249 Family history of ischemic heart disease and other diseases of the circulatory system: Secondary | ICD-10-CM

## 2016-01-11 DIAGNOSIS — F419 Anxiety disorder, unspecified: Secondary | ICD-10-CM | POA: Diagnosis present

## 2016-01-11 DIAGNOSIS — T148XXA Other injury of unspecified body region, initial encounter: Secondary | ICD-10-CM | POA: Diagnosis not present

## 2016-01-11 DIAGNOSIS — Z96649 Presence of unspecified artificial hip joint: Secondary | ICD-10-CM

## 2016-01-11 DIAGNOSIS — W1830XA Fall on same level, unspecified, initial encounter: Secondary | ICD-10-CM | POA: Diagnosis present

## 2016-01-11 DIAGNOSIS — D509 Iron deficiency anemia, unspecified: Secondary | ICD-10-CM | POA: Diagnosis present

## 2016-01-11 DIAGNOSIS — D500 Iron deficiency anemia secondary to blood loss (chronic): Secondary | ICD-10-CM | POA: Diagnosis present

## 2016-01-11 LAB — CBC WITH DIFFERENTIAL/PLATELET
BASOS ABS: 0 10*3/uL (ref 0.0–0.1)
Basophils Relative: 0 %
EOS ABS: 0.1 10*3/uL (ref 0.0–0.7)
Eosinophils Relative: 1 %
HCT: 30.5 % — ABNORMAL LOW (ref 36.0–46.0)
HEMOGLOBIN: 9.6 g/dL — AB (ref 12.0–15.0)
LYMPHS PCT: 6 %
Lymphs Abs: 0.6 10*3/uL — ABNORMAL LOW (ref 0.7–4.0)
MCH: 29.4 pg (ref 26.0–34.0)
MCHC: 31.5 g/dL (ref 30.0–36.0)
MCV: 93.3 fL (ref 78.0–100.0)
MONOS PCT: 7 %
Monocytes Absolute: 0.8 10*3/uL (ref 0.1–1.0)
Neutro Abs: 9.3 10*3/uL — ABNORMAL HIGH (ref 1.7–7.7)
Neutrophils Relative %: 86 %
Platelets: 223 10*3/uL (ref 150–400)
RBC: 3.27 MIL/uL — ABNORMAL LOW (ref 3.87–5.11)
RDW: 17.2 % — ABNORMAL HIGH (ref 11.5–15.5)
WBC: 10.8 10*3/uL — ABNORMAL HIGH (ref 4.0–10.5)

## 2016-01-11 LAB — BASIC METABOLIC PANEL
ANION GAP: 5 (ref 5–15)
BUN: 22 mg/dL — ABNORMAL HIGH (ref 6–20)
CHLORIDE: 106 mmol/L (ref 101–111)
CO2: 24 mmol/L (ref 22–32)
CREATININE: 1.43 mg/dL — AB (ref 0.44–1.00)
Calcium: 7.9 mg/dL — ABNORMAL LOW (ref 8.9–10.3)
GFR calc non Af Amer: 35 mL/min — ABNORMAL LOW (ref >60)
GFR, EST AFRICAN AMERICAN: 40 mL/min — AB (ref >60)
Glucose, Bld: 104 mg/dL — ABNORMAL HIGH (ref 65–99)
POTASSIUM: 3.9 mmol/L (ref 3.5–5.1)
SODIUM: 135 mmol/L (ref 135–145)

## 2016-01-11 LAB — PROTIME-INR
INR: 1.21
Prothrombin Time: 15.4 seconds — ABNORMAL HIGH (ref 11.4–15.2)

## 2016-01-11 MED ORDER — FENTANYL CITRATE (PF) 100 MCG/2ML IJ SOLN
50.0000 ug | INTRAMUSCULAR | Status: DC | PRN
  Administered 2016-01-11 (×2): 50 ug via INTRAVENOUS
  Filled 2016-01-11 (×2): qty 2

## 2016-01-11 MED ORDER — SODIUM CHLORIDE 0.9 % IV BOLUS (SEPSIS)
1000.0000 mL | Freq: Once | INTRAVENOUS | Status: AC
  Administered 2016-01-11: 1000 mL via INTRAVENOUS

## 2016-01-11 MED ORDER — ONDANSETRON HCL 4 MG/2ML IJ SOLN
4.0000 mg | INTRAMUSCULAR | Status: DC | PRN
  Administered 2016-01-11: 4 mg via INTRAVENOUS
  Filled 2016-01-11: qty 2

## 2016-01-11 NOTE — Progress Notes (Signed)
Please let patient's daughter know that the lab result is low normal. Need to reduce Methimazole form '10mg'$  2x daily to HALF tab daily only Also schedule lab visit prior to OV on 2/5

## 2016-01-11 NOTE — ED Provider Notes (Signed)
[facility] DEPT Provider Note   CSN: [id number] Arrival date & time: 01/11/16  2113     History   Chief Complaint Chief Complaint  Patient presents with  . Fall  . Hip Pain    HPI Dawn [name] is a 76 y.o. female.  The history is provided by the patient.  Fall  This is a new problem. The current episode started 1 to 2 hours ago. The problem occurs constantly. The problem has not changed since onset.Pertinent negatives include no chest pain and no abdominal pain. Exacerbated by: moving right leg. Nothing relieves the symptoms. She has tried nothing for the symptoms.  Hip Pain  This is a new problem. The problem occurs constantly. The problem has not changed since onset.Pertinent negatives include no chest pain and no abdominal pain.    Past Medical History:  Diagnosis Date  . COPD (chronic obstructive pulmonary disease) ([place])   . History of anxiety   . History of depression   . Spinal stenosis   . UTI (urinary tract infection)     Patient Active Problem List   Diagnosis Date Noted  . History of depression 12/12/2015  . Alteration consciousness 11/30/2015  . SVT (supraventricular tachycardia) ([place]) 11/27/2015  . Diarrhea 11/27/2015  . Depression 11/27/2015  . Polyneuropathy ([place]) 11/27/2015  . Renal insufficiency   . Protein-calorie malnutrition, severe 11/18/2015  . Intra-abdominal fluid collection   . Premature atrial contractions   . Functional diarrhea   . Sepsis ([place])   . Hyperthyroidism   . Hypotension   . Septic shock ([place])   . Acute encephalopathy   . Hypokalemia   . Acute delirium 11/10/2015  . Acute kidney injury ([place]) 11/10/2015  . Anemia, iron deficiency 11/10/2015  . Anxiety 11/10/2015  . Chronic pain syndrome 11/10/2015  . Recurrent UTI 11/10/2015  . Acute respiratory failure with hypoxia ([place]) 11/10/2015  . COPD (chronic obstructive pulmonary disease) ([place]) 11/10/2015  . Acute hyperglycemia 11/10/2015  . CKD (chronic kidney  disease) stage 3, GFR 30-59 ml/min 11/10/2015  . Narrow complex tachycardia ([place]) 11/10/2015  . HTN (hypertension) 11/10/2015  . Generalized weakness 11/10/2015  . Anemia   . Delirium   . Hypoxemia   . Tachycardia     Past Surgical History:  Procedure Laterality Date  . FLEXIBLE SIGMOIDOSCOPY Left 11/18/2015   Procedure: FLEXIBLE SIGMOIDOSCOPY;  Surgeon: [name], MD;  Location: [facility];  Service: Endoscopy;  Laterality: Left;  . FLEXIBLE SIGMOIDOSCOPY N/A 11/20/2015   Procedure: FLEXIBLE SIGMOIDOSCOPY;  Surgeon: [name], MD;  Location[facility] ENDOSCOPY;  Service: Endoscopy;  Laterality: N/A;  . NO PAST SURGERIES      OB History    No data available       Home Medications    Prior to Admission medications   Medication Sig Start Date End Date Taking? Authorizing Provider  ALPRAZolam Duanne Moron) 0.5 MG tablet Take 1 tablet (0.5 mg total) by mouth 2 (two) times daily as needed for anxiety. 12/03/15   Domenic Polite, MD  buPROPion (WELLBUTRIN XL) 150 MG 24 hr tablet Take 150 mg by mouth daily.    Historical Provider, MD  FERROCITE 324 MG TABS tablet Take 1 tablet (106 mg of iron total) by mouth 2 (two) times daily after a meal. 12/03/15   Domenic Polite, MD  INCRUSE ELLIPTA 62.5 MCG/INH AEPB Inhale 1 puff into the lungs daily. 10/19/15   Historical Provider, MD  lactose free nutrition (BOOST) LIQD Take 237 mLs by mouth 2 (two) times daily  between meals.    Historical Provider, MD  loperamide (IMODIUM) 2 MG capsule Take 4 mg by mouth as needed for diarrhea or loose stools.    Historical Provider, MD  methimazole (TAPAZOLE) 10 MG tablet Take 1.5 tablets (15 mg total) by mouth 2 (two) times daily. 12/03/15 01/02/16  Domenic Polite, MD  metoprolol tartrate (LOPRESSOR) 25 MG tablet Take 25 mg by mouth 2 (two) times daily.    Historical Provider, MD  oxyCODONE (OXY IR/ROXICODONE) 5 MG immediate release tablet Take 1 tablet (5 mg total) by mouth every 6 (six) hours as needed for moderate  pain. 12/03/15   Domenic Polite, MD  traZODone (DESYREL) 100 MG tablet Take 100 mg by mouth at bedtime.    Historical Provider, MD    Family History Family History  Problem Relation Age of Onset  . Heart attack Mother   . Diabetes Father   . Thyroid disease Neg Hx     Social History Social History  Substance Use Topics  . Smoking status: Former Research scientist (life sciences)  . Smokeless tobacco: Never Used  . Alcohol use No     Allergies   Patient has no known allergies.   Review of Systems Review of Systems  Cardiovascular: Negative for chest pain.  Gastrointestinal: Negative for abdominal pain.  All other systems reviewed and are negative.    Physical Exam Updated Vital Signs BP 99/57 (BP Location: Left Arm)   Pulse 79   Temp 97.8 F (36.6 C) (Oral)   Resp 23   Ht '5\' 8"'$  (1.727 m)   Wt 122 lb (55.3 kg)   SpO2 93%   BMI 18.55 kg/m   Physical Exam  Constitutional: She is oriented to person, place, and time. She appears well-developed and well-nourished. No distress.  HENT:  Head: Normocephalic and atraumatic.  Nose: Nose normal.  Eyes: Conjunctivae are normal.  Neck: Neck supple. No tracheal deviation present.  Cardiovascular: Normal rate, regular rhythm and normal heart sounds.   Pulmonary/Chest: Effort normal and breath sounds normal. No respiratory distress.  Abdominal: Soft. She exhibits no distension.  Musculoskeletal:       Right hip: She exhibits tenderness.  shortening and external rotation of right lower extremity  Neurological: She is alert and oriented to person, place, and time.  Skin: Skin is warm and dry.  Psychiatric: She has a normal mood and affect.  Vitals reviewed.    ED Treatments / Results  Labs (all labs ordered are listed, but only abnormal results are displayed) Labs Reviewed  CBC WITH DIFFERENTIAL/PLATELET - Abnormal; Notable for the following:       Result Value   WBC 10.8 (*)    RBC 3.27 (*)    Hemoglobin 9.6 (*)    HCT 30.5 (*)    RDW  17.2 (*)    Neutro Abs 9.3 (*)    Lymphs Abs 0.6 (*)    All other components within normal limits  BASIC METABOLIC PANEL - Abnormal; Notable for the following:    Glucose, Bld 104 (*)    BUN 22 (*)    Creatinine, Ser 1.43 (*)    Calcium 7.9 (*)    GFR calc non Af Amer 35 (*)    GFR calc Af Amer 40 (*)    All other components within normal limits  PROTIME-INR - Abnormal; Notable for the following:    Prothrombin Time 15.4 (*)    All other components within normal limits    EKG  EKG Interpretation None  Radiology Dg Chest 1 View  Result Date: 01/11/2016 CLINICAL DATA:  Right hip pain status post fall EXAM: CHEST 1 VIEW COMPARISON:  12/03/2015 FINDINGS: Lungs are hyperinflated with emphysematous changes. Probable mild fibrosis within the bilateral lung bases. No acute consolidation or effusion. Stable cardiomegaly. Atherosclerosis. No pneumothorax. Old right clavicle fracture. Multiple old right rib fractures. IMPRESSION: 1. Hyperinflation with emphysematous changes.  No acute infiltrate. 2. Stable cardiomegaly. Electronically Signed   By: Donavan Foil M.D.   On: 01/11/2016 22:09   Dg Hip Unilat W Or Wo Pelvis 2-3 Views Right  Result Date: 01/11/2016 CLINICAL DATA:  [place] at home while walking from continue bedroom. EXAM: DG HIP (WITH OR WITHOUT PELVIS) 2-3V RIGHT COMPARISON:  None FINDINGS: There is an acute subcapital right hip fracture with mild varus angulation. No dislocation. No radiographic findings to suggest a pathologic basis for the fracture. IMPRESSION: Subcapital right hip fracture Electronically Signed   By: Andreas Newport M.D.   On: 01/11/2016 22:08    Procedures Procedures (including critical care time)  Medications Ordered in ED Medications  ondansetron [facility]) injection 4 mg (4 mg Intravenous Given 01/11/16 2142)  fentaNYL (SUBLIMAZE) injection 50 mcg (50 mcg Intravenous Given 01/11/16 2248)     Initial Impression / Assessment and Plan / ED Course  I  have reviewed the triage vital signs and the nursing notes.  Pertinent labs & imaging results that were available during my care of the patient were reviewed by me and considered in my medical decision making (see chart for details).  Clinical Course    76 y.o. female presents after fall from standing onto right hip, exam concerning for fracture with shortening and point tenderness over hip. HDS, no other signs of significant trauma. Orthopedics consulted, admit to medicine pending surgical management.   Final Clinical Impressions(s) / ED Diagnoses   Final diagnoses:  Closed subcapital fracture of femur, right, initial encounter ([place])  Fall from standing, initial encounter    New Prescriptions New Prescriptions   No medications on file     Leo Grosser, MD 01/12/16 [phone]

## 2016-01-11 NOTE — ED Notes (Signed)
Patient transported to X-ray 

## 2016-01-11 NOTE — ED Triage Notes (Signed)
Per EMS: Shortening of pain in Right leg and hip. Pelvic binding in place. 1104mg of Fentanyl. Patient has hx of tremors per family. Daughter presented at the home but patient was there with 2 other gentlemen. She was having tremors and dropped a container of milk. Possibly fell on the milk per family that was in the home.  #22 R hand. IVF KVO. Scoop stretcher and immobilized since contact with EMS. Pain 10/10. Pain 5/5 after Fentanyl. 99/58, 80 irregular, CBG 139.  hx of PAC. She is wearing a heart monitor and her PCP is aware of the PACs. They are posing no complications for the patient. Good distal pulses. No LOC.

## 2016-01-11 NOTE — ED Notes (Signed)
Dr.Knott at bedside to discuss plan of care for pt. Pt changed into gown and clothing placed in personal belongings bag.

## 2016-01-11 NOTE — ED Notes (Signed)
Bed: WA10 Expected date:  Expected time:  Means of arrival:  Comments: EMS 

## 2016-01-11 NOTE — ED Notes (Signed)
Patient states "I have tremors and sometimes they are worse than others. I guess this was one of those times because I was getting milk out the fridge and dropped it and somehow slipped and fell. I fell on my right side and hit my hip, leg, and the back of my head.". Denies LOC, states she is having pain in right hip that shoots down her leg, unable to move and her grandson tried to get her up. The pain was so severe that she told them to leave her laying there and call 911. States she was wearing her bedroom shoes. Denies headaches, is A&O x4, denies any visual changes.

## 2016-01-11 NOTE — ED Notes (Signed)
Pt placed onto 2L O2 via [place] due to O2 saturation of 89%.

## 2016-01-12 ENCOUNTER — Encounter (HOSPITAL_COMMUNITY)
Admission: EM | Disposition: A | Discharge status: Skilled Nursing Facility | Payer: Self-pay | Source: Home / Self Care | Attending: Internal Medicine

## 2016-01-12 ENCOUNTER — Inpatient Hospital Stay (HOSPITAL_COMMUNITY): Payer: Medicare Other

## 2016-01-12 ENCOUNTER — Inpatient Hospital Stay (HOSPITAL_COMMUNITY): Payer: Medicare Other | Admitting: Anesthesiology

## 2016-01-12 ENCOUNTER — Encounter (HOSPITAL_COMMUNITY): Payer: Self-pay | Admitting: Family Medicine

## 2016-01-12 ENCOUNTER — Telehealth: Payer: Self-pay | Admitting: Endocrinology

## 2016-01-12 DIAGNOSIS — D500 Iron deficiency anemia secondary to blood loss (chronic): Secondary | ICD-10-CM | POA: Diagnosis not present

## 2016-01-12 DIAGNOSIS — I129 Hypertensive chronic kidney disease with stage 1 through stage 4 chronic kidney disease, or unspecified chronic kidney disease: Secondary | ICD-10-CM | POA: Diagnosis present

## 2016-01-12 DIAGNOSIS — S72009A Fracture of unspecified part of neck of unspecified femur, initial encounter for closed fracture: Secondary | ICD-10-CM | POA: Diagnosis not present

## 2016-01-12 DIAGNOSIS — I471 Supraventricular tachycardia: Secondary | ICD-10-CM | POA: Diagnosis present

## 2016-01-12 DIAGNOSIS — S72011D Unspecified intracapsular fracture of right femur, subsequent encounter for closed fracture with routine healing: Secondary | ICD-10-CM | POA: Diagnosis not present

## 2016-01-12 DIAGNOSIS — G894 Chronic pain syndrome: Secondary | ICD-10-CM

## 2016-01-12 DIAGNOSIS — M4800 Spinal stenosis, site unspecified: Secondary | ICD-10-CM | POA: Diagnosis present

## 2016-01-12 DIAGNOSIS — F419 Anxiety disorder, unspecified: Secondary | ICD-10-CM | POA: Diagnosis present

## 2016-01-12 DIAGNOSIS — D508 Other iron deficiency anemias: Secondary | ICD-10-CM

## 2016-01-12 DIAGNOSIS — J90 Pleural effusion, not elsewhere classified: Secondary | ICD-10-CM | POA: Diagnosis not present

## 2016-01-12 DIAGNOSIS — I10 Essential (primary) hypertension: Secondary | ICD-10-CM | POA: Diagnosis not present

## 2016-01-12 DIAGNOSIS — S72011A Unspecified intracapsular fracture of right femur, initial encounter for closed fracture: Principal | ICD-10-CM

## 2016-01-12 DIAGNOSIS — I509 Heart failure, unspecified: Secondary | ICD-10-CM | POA: Diagnosis not present

## 2016-01-12 DIAGNOSIS — Z79891 Long term (current) use of opiate analgesic: Secondary | ICD-10-CM | POA: Diagnosis not present

## 2016-01-12 DIAGNOSIS — Z87891 Personal history of nicotine dependence: Secondary | ICD-10-CM | POA: Diagnosis not present

## 2016-01-12 DIAGNOSIS — R1030 Lower abdominal pain, unspecified: Secondary | ICD-10-CM | POA: Diagnosis not present

## 2016-01-12 DIAGNOSIS — Z8249 Family history of ischemic heart disease and other diseases of the circulatory system: Secondary | ICD-10-CM | POA: Diagnosis not present

## 2016-01-12 DIAGNOSIS — J449 Chronic obstructive pulmonary disease, unspecified: Secondary | ICD-10-CM

## 2016-01-12 DIAGNOSIS — F329 Major depressive disorder, single episode, unspecified: Secondary | ICD-10-CM | POA: Diagnosis present

## 2016-01-12 DIAGNOSIS — W010XXA Fall on same level from slipping, tripping and stumbling without subsequent striking against object, initial encounter: Secondary | ICD-10-CM | POA: Diagnosis not present

## 2016-01-12 DIAGNOSIS — R0902 Hypoxemia: Secondary | ICD-10-CM | POA: Diagnosis present

## 2016-01-12 DIAGNOSIS — E0590 Thyrotoxicosis, unspecified without thyrotoxic crisis or storm: Secondary | ICD-10-CM

## 2016-01-12 DIAGNOSIS — R259 Unspecified abnormal involuntary movements: Secondary | ICD-10-CM | POA: Diagnosis not present

## 2016-01-12 DIAGNOSIS — S72041A Displaced fracture of base of neck of right femur, initial encounter for closed fracture: Secondary | ICD-10-CM | POA: Diagnosis not present

## 2016-01-12 DIAGNOSIS — N183 Chronic kidney disease, stage 3 (moderate): Secondary | ICD-10-CM

## 2016-01-12 DIAGNOSIS — N390 Urinary tract infection, site not specified: Secondary | ICD-10-CM | POA: Diagnosis present

## 2016-01-12 DIAGNOSIS — Z79899 Other long term (current) drug therapy: Secondary | ICD-10-CM | POA: Diagnosis not present

## 2016-01-12 DIAGNOSIS — E1122 Type 2 diabetes mellitus with diabetic chronic kidney disease: Secondary | ICD-10-CM | POA: Diagnosis present

## 2016-01-12 DIAGNOSIS — S72001A Fracture of unspecified part of neck of right femur, initial encounter for closed fracture: Secondary | ICD-10-CM | POA: Diagnosis not present

## 2016-01-12 DIAGNOSIS — Z681 Body mass index (BMI) 19 or less, adult: Secondary | ICD-10-CM | POA: Diagnosis not present

## 2016-01-12 DIAGNOSIS — R2681 Unsteadiness on feet: Secondary | ICD-10-CM | POA: Diagnosis not present

## 2016-01-12 DIAGNOSIS — I9581 Postprocedural hypotension: Secondary | ICD-10-CM | POA: Diagnosis not present

## 2016-01-12 DIAGNOSIS — W1830XA Fall on same level, unspecified, initial encounter: Secondary | ICD-10-CM | POA: Diagnosis present

## 2016-01-12 DIAGNOSIS — E43 Unspecified severe protein-calorie malnutrition: Secondary | ICD-10-CM | POA: Diagnosis not present

## 2016-01-12 DIAGNOSIS — M6281 Muscle weakness (generalized): Secondary | ICD-10-CM | POA: Diagnosis not present

## 2016-01-12 DIAGNOSIS — D509 Iron deficiency anemia, unspecified: Secondary | ICD-10-CM | POA: Diagnosis not present

## 2016-01-12 DIAGNOSIS — E039 Hypothyroidism, unspecified: Secondary | ICD-10-CM | POA: Diagnosis not present

## 2016-01-12 DIAGNOSIS — I4891 Unspecified atrial fibrillation: Secondary | ICD-10-CM | POA: Diagnosis not present

## 2016-01-12 DIAGNOSIS — Z9181 History of falling: Secondary | ICD-10-CM | POA: Diagnosis not present

## 2016-01-12 DIAGNOSIS — R488 Other symbolic dysfunctions: Secondary | ICD-10-CM | POA: Diagnosis not present

## 2016-01-12 DIAGNOSIS — Z471 Aftercare following joint replacement surgery: Secondary | ICD-10-CM | POA: Diagnosis not present

## 2016-01-12 DIAGNOSIS — R1311 Dysphagia, oral phase: Secondary | ICD-10-CM | POA: Diagnosis not present

## 2016-01-12 DIAGNOSIS — I959 Hypotension, unspecified: Secondary | ICD-10-CM | POA: Diagnosis present

## 2016-01-12 DIAGNOSIS — Z96641 Presence of right artificial hip joint: Secondary | ICD-10-CM | POA: Diagnosis not present

## 2016-01-12 DIAGNOSIS — Z23 Encounter for immunization: Secondary | ICD-10-CM | POA: Diagnosis not present

## 2016-01-12 DIAGNOSIS — Z7901 Long term (current) use of anticoagulants: Secondary | ICD-10-CM | POA: Diagnosis not present

## 2016-01-12 HISTORY — PX: HIP ARTHROPLASTY: SHX981

## 2016-01-12 LAB — URINALYSIS, ROUTINE W REFLEX MICROSCOPIC
Bilirubin Urine: NEGATIVE
Glucose, UA: NEGATIVE mg/dL
Ketones, ur: NEGATIVE mg/dL
Nitrite: POSITIVE — AB
PH: 5 (ref 5.0–8.0)
Protein, ur: 30 mg/dL — AB
SPECIFIC GRAVITY, URINE: 1.015 (ref 1.005–1.030)

## 2016-01-12 LAB — BASIC METABOLIC PANEL
Anion gap: 5 (ref 5–15)
BUN: 19 mg/dL (ref 6–20)
CHLORIDE: 109 mmol/L (ref 101–111)
CO2: 24 mmol/L (ref 22–32)
CREATININE: 1.1 mg/dL — AB (ref 0.44–1.00)
Calcium: 7.5 mg/dL — ABNORMAL LOW (ref 8.9–10.3)
GFR calc Af Amer: 55 mL/min — ABNORMAL LOW (ref >60)
GFR calc non Af Amer: 48 mL/min — ABNORMAL LOW (ref >60)
GLUCOSE: 101 mg/dL — AB (ref 65–99)
POTASSIUM: 4 mmol/L (ref 3.5–5.1)
Sodium: 138 mmol/L (ref 135–145)

## 2016-01-12 LAB — CBC
HCT: 28 % — ABNORMAL LOW (ref 36.0–46.0)
HEMOGLOBIN: 9 g/dL — AB (ref 12.0–15.0)
MCH: 30.7 pg (ref 26.0–34.0)
MCHC: 32.1 g/dL (ref 30.0–36.0)
MCV: 95.6 fL (ref 78.0–100.0)
Platelets: 178 10*3/uL (ref 150–400)
RBC: 2.93 MIL/uL — AB (ref 3.87–5.11)
RDW: 17.6 % — ABNORMAL HIGH (ref 11.5–15.5)
WBC: 8.3 10*3/uL (ref 4.0–10.5)

## 2016-01-12 LAB — SURGICAL PCR SCREEN
MRSA, PCR: NEGATIVE
STAPHYLOCOCCUS AUREUS: NEGATIVE

## 2016-01-12 LAB — ABO/RH: ABO/RH(D): O POS

## 2016-01-12 LAB — TROPONIN I: Troponin I: 0.1 ng/mL (ref <0.03)

## 2016-01-12 SURGERY — HEMIARTHROPLASTY, HIP, DIRECT ANTERIOR APPROACH, FOR FRACTURE
Anesthesia: General | Site: Hip | Laterality: Right

## 2016-01-12 MED ORDER — SODIUM CHLORIDE 0.9 % IV SOLN: INTRAVENOUS | Status: DC

## 2016-01-12 MED ORDER — SODIUM CHLORIDE 0.9 % IV BOLUS (SEPSIS)
500.0000 mL | Freq: Once | INTRAVENOUS | Status: AC
  Administered 2016-01-12: 500 mL via INTRAVENOUS

## 2016-01-12 MED ORDER — METOPROLOL TARTRATE 12.5 MG HALF TABLET
12.5000 mg | ORAL_TABLET | Freq: Two times a day (BID) | ORAL | Status: DC
  Administered 2016-01-13 – 2016-01-15 (×5): 12.5 mg via ORAL
  Filled 2016-01-12 (×5): qty 1

## 2016-01-12 MED ORDER — METOCLOPRAMIDE HCL 5 MG/ML IJ SOLN: 5.0000 mg | Freq: Three times a day (TID) | INTRAMUSCULAR | Status: DC | PRN

## 2016-01-12 MED ORDER — DEXAMETHASONE SODIUM PHOSPHATE 10 MG/ML IJ SOLN
INTRAMUSCULAR | Status: DC | PRN
  Administered 2016-01-12: 10 mg via INTRAVENOUS

## 2016-01-12 MED ORDER — SODIUM CHLORIDE 0.9 % IV BOLUS (SEPSIS)
250.0000 mL | Freq: Once | INTRAVENOUS | Status: AC
  Administered 2016-01-12: 250 mL via INTRAVENOUS

## 2016-01-12 MED ORDER — ROCURONIUM BROMIDE 50 MG/5ML IV SOSY
PREFILLED_SYRINGE | INTRAVENOUS | Status: DC | PRN
  Administered 2016-01-12: 10 mg via INTRAVENOUS

## 2016-01-12 MED ORDER — PHENYLEPHRINE 40 MCG/ML (10ML) SYRINGE FOR IV PUSH (FOR BLOOD PRESSURE SUPPORT)
PREFILLED_SYRINGE | INTRAVENOUS | Status: AC
  Filled 2016-01-12: qty 20

## 2016-01-12 MED ORDER — PROPOFOL 10 MG/ML IV BOLUS
INTRAVENOUS | Status: DC | PRN
  Administered 2016-01-12: 100 mg via INTRAVENOUS

## 2016-01-12 MED ORDER — BUPROPION HCL ER (XL) 150 MG PO TB24
150.0000 mg | ORAL_TABLET | Freq: Every day | ORAL | Status: DC
  Administered 2016-01-13 – 2016-01-15 (×3): 150 mg via ORAL
  Filled 2016-01-12 (×4): qty 1

## 2016-01-12 MED ORDER — FENTANYL CITRATE (PF) 100 MCG/2ML IJ SOLN
INTRAMUSCULAR | Status: AC
  Filled 2016-01-12: qty 2

## 2016-01-12 MED ORDER — HYDROCODONE-ACETAMINOPHEN 5-325 MG PO TABS
1.0000 | ORAL_TABLET | Freq: Four times a day (QID) | ORAL | Status: DC | PRN
  Administered 2016-01-13: 2 via ORAL
  Administered 2016-01-13: 1 via ORAL
  Administered 2016-01-13 – 2016-01-14 (×2): 2 via ORAL
  Administered 2016-01-14: 1 via ORAL
  Administered 2016-01-14 – 2016-01-15 (×2): 2 via ORAL
  Administered 2016-01-15: 1 via ORAL
  Filled 2016-01-12 (×2): qty 1
  Filled 2016-01-12: qty 2
  Filled 2016-01-12: qty 1
  Filled 2016-01-12 (×3): qty 2
  Filled 2016-01-12: qty 1
  Filled 2016-01-12: qty 2

## 2016-01-12 MED ORDER — UMECLIDINIUM BROMIDE 62.5 MCG/INH IN AEPB
1.0000 | INHALATION_SPRAY | Freq: Every day | RESPIRATORY_TRACT | Status: DC
  Administered 2016-01-12 – 2016-01-15 (×4): 1 via RESPIRATORY_TRACT
  Filled 2016-01-12 (×2): qty 7

## 2016-01-12 MED ORDER — CIPROFLOXACIN IN D5W 400 MG/200ML IV SOLN
400.0000 mg | Freq: Once | INTRAVENOUS | Status: AC
  Administered 2016-01-12: 400 mg via INTRAVENOUS

## 2016-01-12 MED ORDER — SODIUM CHLORIDE 0.9 % IV BOLUS (SEPSIS)
1000.0000 mL | Freq: Once | INTRAVENOUS | Status: AC
  Administered 2016-01-12: 1000 mL via INTRAVENOUS

## 2016-01-12 MED ORDER — FERROUS FUMARATE 324 (106 FE) MG PO TABS
106.0000 mg | ORAL_TABLET | Freq: Two times a day (BID) | ORAL | Status: DC
  Administered 2016-01-12 – 2016-01-15 (×4): 106 mg via ORAL
  Filled 2016-01-12 (×10): qty 1

## 2016-01-12 MED ORDER — PROPOFOL 10 MG/ML IV BOLUS
INTRAVENOUS | Status: AC
  Filled 2016-01-12: qty 20

## 2016-01-12 MED ORDER — SUGAMMADEX SODIUM 200 MG/2ML IV SOLN
INTRAVENOUS | Status: AC
  Filled 2016-01-12: qty 2

## 2016-01-12 MED ORDER — STERILE WATER FOR IRRIGATION IR SOLN
Status: DC | PRN
  Administered 2016-01-12: 3000 mL

## 2016-01-12 MED ORDER — METHOCARBAMOL 500 MG PO TABS: 500.0000 mg | ORAL_TABLET | Freq: Four times a day (QID) | ORAL | Status: DC | PRN

## 2016-01-12 MED ORDER — ONDANSETRON HCL 4 MG/2ML IJ SOLN: 4.0000 mg | Freq: Four times a day (QID) | INTRAMUSCULAR | Status: DC | PRN

## 2016-01-12 MED ORDER — SUGAMMADEX SODIUM 200 MG/2ML IV SOLN
INTRAVENOUS | Status: DC | PRN
  Administered 2016-01-12: 100 mg via INTRAVENOUS

## 2016-01-12 MED ORDER — ROCURONIUM BROMIDE 50 MG/5ML IV SOSY
PREFILLED_SYRINGE | INTRAVENOUS | Status: AC
  Filled 2016-01-12: qty 5

## 2016-01-12 MED ORDER — LOPERAMIDE HCL 2 MG PO CAPS: 4.0000 mg | ORAL_CAPSULE | ORAL | Status: DC | PRN

## 2016-01-12 MED ORDER — METHOCARBAMOL 1000 MG/10ML IJ SOLN
500.0000 mg | Freq: Four times a day (QID) | INTRAMUSCULAR | Status: DC | PRN
  Filled 2016-01-12: qty 5

## 2016-01-12 MED ORDER — 0.9 % SODIUM CHLORIDE (POUR BTL) OPTIME
TOPICAL | Status: DC | PRN
  Administered 2016-01-12: 1000 mL

## 2016-01-12 MED ORDER — DOCUSATE SODIUM 100 MG PO CAPS
100.0000 mg | ORAL_CAPSULE | Freq: Two times a day (BID) | ORAL | Status: DC
  Administered 2016-01-12 – 2016-01-15 (×6): 100 mg via ORAL
  Filled 2016-01-12 (×6): qty 1

## 2016-01-12 MED ORDER — ALBUMIN HUMAN 25 % IV SOLN
INTRAVENOUS | Status: DC | PRN
  Administered 2016-01-12 (×2): via INTRAVENOUS

## 2016-01-12 MED ORDER — GABAPENTIN 300 MG PO CAPS
600.0000 mg | ORAL_CAPSULE | Freq: Three times a day (TID) | ORAL | Status: DC
  Administered 2016-01-12 – 2016-01-15 (×8): 600 mg via ORAL
  Filled 2016-01-12 (×9): qty 2

## 2016-01-12 MED ORDER — SUCCINYLCHOLINE CHLORIDE 200 MG/10ML IV SOSY
PREFILLED_SYRINGE | INTRAVENOUS | Status: AC
  Filled 2016-01-12: qty 10

## 2016-01-12 MED ORDER — MENTHOL 3 MG MT LOZG
1.0000 | LOZENGE | OROMUCOSAL | Status: DC | PRN
  Filled 2016-01-12: qty 9

## 2016-01-12 MED ORDER — MORPHINE SULFATE (PF) 2 MG/ML IV SOLN
0.5000 mg | INTRAVENOUS | Status: DC | PRN
  Administered 2016-01-12 (×3): 0.5 mg via INTRAVENOUS
  Filled 2016-01-12 (×3): qty 1

## 2016-01-12 MED ORDER — BISACODYL 10 MG RE SUPP: 10.0000 mg | Freq: Every day | RECTAL | Status: DC | PRN

## 2016-01-12 MED ORDER — CIPROFLOXACIN HCL 500 MG PO TABS
500.0000 mg | ORAL_TABLET | Freq: Two times a day (BID) | ORAL | Status: DC
  Administered 2016-01-12 – 2016-01-14 (×4): 500 mg via ORAL
  Filled 2016-01-12 (×5): qty 1

## 2016-01-12 MED ORDER — METHIMAZOLE 10 MG PO TABS
10.0000 mg | ORAL_TABLET | Freq: Two times a day (BID) | ORAL | Status: DC
  Administered 2016-01-13 – 2016-01-15 (×5): 10 mg via ORAL
  Filled 2016-01-12 (×8): qty 1

## 2016-01-12 MED ORDER — METOCLOPRAMIDE HCL 5 MG/ML IJ SOLN: 10.0000 mg | Freq: Once | INTRAMUSCULAR | Status: DC | PRN

## 2016-01-12 MED ORDER — SUCCINYLCHOLINE CHLORIDE 200 MG/10ML IV SOSY
PREFILLED_SYRINGE | INTRAVENOUS | Status: DC | PRN
  Administered 2016-01-12: 100 mg via INTRAVENOUS

## 2016-01-12 MED ORDER — DILTIAZEM HCL 25 MG/5ML IV SOLN
10.0000 mg | Freq: Once | INTRAVENOUS | Status: AC
  Administered 2016-01-12: 10 mg via INTRAVENOUS
  Filled 2016-01-12: qty 5

## 2016-01-12 MED ORDER — HYDROMORPHONE HCL 1 MG/ML IJ SOLN: 0.2500 mg | INTRAMUSCULAR | Status: DC | PRN

## 2016-01-12 MED ORDER — CEFTRIAXONE SODIUM 1 G IJ SOLR
1.0000 g | INTRAMUSCULAR | Status: DC
  Filled 2016-01-12: qty 10

## 2016-01-12 MED ORDER — FENTANYL CITRATE (PF) 100 MCG/2ML IJ SOLN: 25.0000 ug | INTRAMUSCULAR | Status: DC | PRN

## 2016-01-12 MED ORDER — POLYETHYLENE GLYCOL 3350 17 G PO PACK: 17.0000 g | PACK | Freq: Every day | ORAL | Status: DC | PRN

## 2016-01-12 MED ORDER — ALBUMIN HUMAN 5 % IV SOLN
INTRAVENOUS | Status: AC
  Filled 2016-01-12: qty 500

## 2016-01-12 MED ORDER — CEFAZOLIN SODIUM-DEXTROSE 2-4 GM/100ML-% IV SOLN
2.0000 g | Freq: Four times a day (QID) | INTRAVENOUS | Status: AC
  Administered 2016-01-12 – 2016-01-13 (×2): 2 g via INTRAVENOUS
  Filled 2016-01-12 (×4): qty 100

## 2016-01-12 MED ORDER — FENTANYL CITRATE (PF) 100 MCG/2ML IJ SOLN
INTRAMUSCULAR | Status: DC | PRN
  Administered 2016-01-12: 25 ug via INTRAVENOUS

## 2016-01-12 MED ORDER — DILTIAZEM HCL 100 MG IV SOLR
5.0000 mg/h | INTRAVENOUS | Status: DC
  Filled 2016-01-12: qty 100

## 2016-01-12 MED ORDER — LACTATED RINGERS IV SOLN: INTRAVENOUS | Status: DC

## 2016-01-12 MED ORDER — HYDROMORPHONE HCL 1 MG/ML IJ SOLN
0.5000 mg | INTRAMUSCULAR | Status: DC | PRN
  Administered 2016-01-12: 0.5 mg via INTRAVENOUS
  Administered 2016-01-12: 10:00:00 1 mg via INTRAVENOUS
  Filled 2016-01-12: qty 0.5
  Filled 2016-01-12: qty 1

## 2016-01-12 MED ORDER — SODIUM CHLORIDE 0.9 % IV SOLN
INTRAVENOUS | Status: DC
  Administered 2016-01-12 (×3): via INTRAVENOUS
  Administered 2016-01-13: 100 mL/h via INTRAVENOUS
  Administered 2016-01-14: 06:00:00 via INTRAVENOUS

## 2016-01-12 MED ORDER — ONDANSETRON HCL 4 MG PO TABS: 4.0000 mg | ORAL_TABLET | Freq: Four times a day (QID) | ORAL | Status: DC | PRN

## 2016-01-12 MED ORDER — LIDOCAINE 2% (20 MG/ML) 5 ML SYRINGE
INTRAMUSCULAR | Status: AC
  Filled 2016-01-12: qty 5

## 2016-01-12 MED ORDER — MEPERIDINE HCL 50 MG/ML IJ SOLN: 6.2500 mg | INTRAMUSCULAR | Status: DC | PRN

## 2016-01-12 MED ORDER — METOCLOPRAMIDE HCL 5 MG PO TABS: 5.0000 mg | ORAL_TABLET | Freq: Three times a day (TID) | ORAL | Status: DC | PRN

## 2016-01-12 MED ORDER — ASPIRIN EC 325 MG PO TBEC
325.0000 mg | DELAYED_RELEASE_TABLET | Freq: Two times a day (BID) | ORAL | Status: DC
  Administered 2016-01-13 – 2016-01-15 (×5): 325 mg via ORAL
  Filled 2016-01-12 (×5): qty 1

## 2016-01-12 MED ORDER — CEFAZOLIN SODIUM-DEXTROSE 2-4 GM/100ML-% IV SOLN
2.0000 g | Freq: Once | INTRAVENOUS | Status: AC
  Administered 2016-01-12: 2 g via INTRAVENOUS
  Filled 2016-01-12: qty 100

## 2016-01-12 MED ORDER — EPHEDRINE SULFATE 50 MG/ML IJ SOLN
INTRAMUSCULAR | Status: DC | PRN
  Administered 2016-01-12 (×3): 10 mg via INTRAVENOUS

## 2016-01-12 MED ORDER — ONDANSETRON HCL 4 MG/2ML IJ SOLN
INTRAMUSCULAR | Status: DC | PRN
  Administered 2016-01-12: 4 mg via INTRAVENOUS

## 2016-01-12 MED ORDER — PHENOL 1.4 % MT LIQD: 1.0000 | OROMUCOSAL | Status: DC | PRN

## 2016-01-12 MED ORDER — HYDROCODONE-ACETAMINOPHEN 5-325 MG PO TABS
1.0000 | ORAL_TABLET | Freq: Four times a day (QID) | ORAL | Status: DC | PRN
  Administered 2016-01-12: 06:00:00 2 via ORAL
  Filled 2016-01-12: qty 2

## 2016-01-12 MED ORDER — INFLUENZA VAC SPLIT QUAD 0.5 ML IM SUSY
0.5000 mL | PREFILLED_SYRINGE | INTRAMUSCULAR | Status: AC
  Administered 2016-01-13: 0.5 mL via INTRAMUSCULAR
  Filled 2016-01-12: qty 0.5

## 2016-01-12 MED ORDER — LACTATED RINGERS IV SOLN
INTRAVENOUS | Status: DC | PRN
  Administered 2016-01-12: 14:00:00 via INTRAVENOUS

## 2016-01-12 MED ORDER — PHENYLEPHRINE HCL 10 MG/ML IJ SOLN
INTRAMUSCULAR | Status: DC | PRN
  Administered 2016-01-12 (×2): 80 ug via INTRAVENOUS
  Administered 2016-01-12 (×2): 120 ug via INTRAVENOUS
  Administered 2016-01-12: 80 ug via INTRAVENOUS
  Administered 2016-01-12: 120 ug via INTRAVENOUS
  Administered 2016-01-12: 80 ug via INTRAVENOUS
  Administered 2016-01-12: 120 ug via INTRAVENOUS

## 2016-01-12 SURGICAL SUPPLY — 51 items
BAG ZIPLOCK 12X15 (MISCELLANEOUS) ×3 IMPLANT
BLADE SAW SGTL 18X1.27X75 (BLADE) ×2 IMPLANT
BLADE SAW SGTL 18X1.27X75MM (BLADE) ×1
CAPT HIP HEMI 2 ×3 IMPLANT
CLOSURE WOUND 1/2 X4 (GAUZE/BANDAGES/DRESSINGS) ×1
DERMABOND ADVANCED (GAUZE/BANDAGES/DRESSINGS) ×2
DERMABOND ADVANCED .7 DNX12 (GAUZE/BANDAGES/DRESSINGS) ×1 IMPLANT
DRAPE INCISE IOBAN 85X60 (DRAPES) ×3 IMPLANT
DRAPE ORTHO SPLIT 77X108 STRL (DRAPES) ×4
DRAPE POUCH INSTRU U-SHP 10X18 (DRAPES) ×3 IMPLANT
DRAPE SURG 17X11 SM STRL (DRAPES) ×3 IMPLANT
DRAPE SURG ORHT 6 SPLT 77X108 (DRAPES) ×2 IMPLANT
DRAPE U-SHAPE 47X51 STRL (DRAPES) ×3 IMPLANT
DRSG AQUACEL AG ADV 3.5X10 (GAUZE/BANDAGES/DRESSINGS) ×3 IMPLANT
DRSG TEGADERM 4X4.75 (GAUZE/BANDAGES/DRESSINGS) IMPLANT
DURAPREP 26ML APPLICATOR (WOUND CARE) ×3 IMPLANT
ELECT BLADE TIP CTD 4 INCH (ELECTRODE) ×3 IMPLANT
ELECT REM PT RETURN 9FT ADLT (ELECTROSURGICAL) ×3
ELECTRODE REM PT RTRN 9FT ADLT (ELECTROSURGICAL) ×1 IMPLANT
EVACUATOR 1/8 PVC DRAIN (DRAIN) IMPLANT
FACESHIELD WRAPAROUND (MASK) ×12 IMPLANT
GAUZE SPONGE 2X2 8PLY STRL LF (GAUZE/BANDAGES/DRESSINGS) IMPLANT
GLOVE BIOGEL M 7.0 STRL (GLOVE) IMPLANT
GLOVE BIOGEL PI IND STRL 7.5 (GLOVE) ×1 IMPLANT
GLOVE BIOGEL PI IND STRL 8.5 (GLOVE) ×1 IMPLANT
GLOVE BIOGEL PI INDICATOR 7.5 (GLOVE) ×2
GLOVE BIOGEL PI INDICATOR 8.5 (GLOVE) ×2
GLOVE ECLIPSE 8.0 STRL XLNG CF (GLOVE) IMPLANT
GLOVE ORTHO TXT STRL SZ7.5 (GLOVE) ×6 IMPLANT
GLOVE SURG ORTHO 8.0 STRL STRW (GLOVE) ×3 IMPLANT
GOWN STRL REUS W/TWL LRG LVL3 (GOWN DISPOSABLE) ×3 IMPLANT
GOWN STRL REUS W/TWL XL LVL3 (GOWN DISPOSABLE) ×6 IMPLANT
HANDPIECE INTERPULSE COAX TIP (DISPOSABLE)
IMMOBILIZER KNEE 20 (SOFTGOODS)
IMMOBILIZER KNEE 20 THIGH 36 (SOFTGOODS) IMPLANT
KIT BASIN OR (CUSTOM PROCEDURE TRAY) ×3 IMPLANT
MANIFOLD NEPTUNE II (INSTRUMENTS) ×3 IMPLANT
PACK TOTAL JOINT (CUSTOM PROCEDURE TRAY) ×3 IMPLANT
POSITIONER SURGICAL ARM (MISCELLANEOUS) ×3 IMPLANT
SET HNDPC FAN SPRY TIP SCT (DISPOSABLE) IMPLANT
SPONGE GAUZE 2X2 STER 10/PKG (GAUZE/BANDAGES/DRESSINGS)
STRIP CLOSURE SKIN 1/2X4 (GAUZE/BANDAGES/DRESSINGS) ×2 IMPLANT
SUT ETHIBOND NAB CT1 #1 30IN (SUTURE) IMPLANT
SUT MNCRL AB 4-0 PS2 18 (SUTURE) ×3 IMPLANT
SUT VIC AB 1 CT1 36 (SUTURE) ×6 IMPLANT
SUT VIC AB 2-0 CT1 27 (SUTURE) ×4
SUT VIC AB 2-0 CT1 TAPERPNT 27 (SUTURE) ×2 IMPLANT
SUT VLOC 180 0 24IN GS25 (SUTURE) ×3 IMPLANT
TOWEL OR 17X26 10 PK STRL BLUE (TOWEL DISPOSABLE) ×6 IMPLANT
TRAY FOLEY CATH 14FRSI W/METER (CATHETERS) ×3 IMPLANT
TRAY FOLEY W/METER SILVER 16FR (SET/KITS/TRAYS/PACK) IMPLANT

## 2016-01-12 NOTE — Telephone Encounter (Signed)
-----   Message from Elayne Snare, MD sent at 01/11/2016  9:16 PM EST ----- Please let patient's daughter know that the lab result is low normal. Need to reduce Methimazole form '10mg'$  2x daily to HALF tab daily only Also schedule lab visit prior to OV on 2/5

## 2016-01-12 NOTE — Anesthesia Postprocedure Evaluation (Signed)
Anesthesia Post Note  Patient: Dawn [name]  Procedure(s) Performed: Procedure(s) (LRB): ARTHROPLASTY BIPOLAR HIP (HEMIARTHROPLASTY) (Right)  Patient location during evaluation: PACU Anesthesia Type: General Level of consciousness: awake and alert Pain management: pain level controlled Vital Signs Assessment: post-procedure vital signs reviewed and stable Respiratory status: spontaneous breathing, nonlabored ventilation, respiratory function stable and patient connected to nasal cannula oxygen Cardiovascular status: blood pressure returned to baseline and stable Postop Assessment: no signs of nausea or vomiting Anesthetic complications: no       Last Vitals:  Vitals:   01/12/16 1600 01/12/16 1615  BP: 102/64 (!) 95/52  Pulse: (!) 104 93  Resp: 18 17  Temp:  37.2 C    Last Pain:  Vitals:   01/12/16 1615  TempSrc:   PainSc: 0-No pain                 Montez Hageman

## 2016-01-12 NOTE — Op Note (Signed)
NAME:  Dawn [name], Dawn [name] NO.:  [id number]   MEDICAL RECORD NO.: [id number]   LOCATION:  2620                         FACILITY:  [facility] OF BIRTH:  January 25, 2040  PHYSICIAN:  Pietro Cassis. Alvan Dame, M.D.     DATE OF PROCEDURE:  01/12/16                               OPERATIVE REPORT     PREOPERATIVE DIAGNOSIS:  right displaced femoral neck fracture.   POSTOPERATIVE DIAGNOSIS:  right displaced femoral neck fracture.   PROCEDURE:  right hip hemiarthroplasty utilizing DePuy component, size 4 standard Tri-Lock stem with a 73m unipolar ball with a +5 adapter.   SURGEON:  MPietro Cassis OAlvan Dame MD   ASSISTANT:  MDanae Orleans PA-C.   ANESTHESIA:  General.   SPECIMENS:  None.   DRAINS:  None.   BLOOD LOSS:  About 200 cc.   COMPLICATIONS:  None.   INDICATION OF PROCEDURE:  Dawn HKeleheris a pleasant 76year old female who lives independently.  She unfortunately had a fall at her house.  She was admitted to the hospital after radiographs revealed a femoral neck fracture.  She was seen and evaluated and was scheduled for surgery for fixation.  The necessity of surgical repair was discussed with she and her family.  Consent was obtained after reviewing risks of infection, DVT, component failure, and need for revision surgery.   PROCEDURE IN DETAIL:  The patient was brought to the operative theater. Once adequate anesthesia, preoperative antibiotics, 2 g of Ancef, 1 gm of Tranexamic Acid, and 10 mg of Decadron administered, the patient was positioned into the left lateral decubitus position with the right side up.  The right lower extremity was then prepped and draped in sterile fashion.  A time-out was performed identifying the patient, planned procedure, and extremity.   A lateral incision was made off the proximal trochanter. Sharp dissection was carried down to the iliotibial band and gluteal fascia. The gluteal fascia was then incised for posterior approach.   The short external rotators were taken down separate from the posterior capsule. An L capsulotomy was made preserving the posterior leaflet for later anatomic repair. Fracture site was identified and after removing comminuted segments of the posterior femoral neck, the femoral head was removed without difficulty and measured on the back table  using the sizing rings and determined to be 47 mm in diameter.   The proximal femur was then exposed.  Retractors placed.  I then drilled, opened the proximal femur.  Then I hand reamed once and  Irrigated the canal to try to prevent fat emboli.  I began broaching the femur with a starter broach up to a size 4 broach with good medial and lateral metaphyseal fit without evidence of any torsion or movement.  A trial reduction was carried out with a standard neck and a +0 then +5 adapter with a 439munipolar ball.  The hip reduced nicely.  The leg lengths appeared to be equal compared to the down leg.   The hip went through a range of motion without evidence of any subluxation or impingement.   Given these findings, the trial components removed.  The final 4 standard  Tri-Lock stem  was opened.  After irrigating the canal, the final stem was impacted and sat at the level where the broach was. Based on this and the trial reduction, a +5 adapter was opened and impacted in the 27m unipolar ball onto a clean and dry trunnion.  The hip had been irrigated throughout the case and again at this point.  I re- Approximated the posterior capsule to the superior leaflet using a  #1 Vicryl.  The remainder of the wound was closed with #1 Vicryl and #0 V-lock sutures in the iliotibial band and gluteal fascia, a  2-0 Vicryl in the sub-Q tissue and a running 4-0 Monocryl in the skin.  The hip was cleaned, dried, and dressed sterilely using Dermabond and Aquacel dressing.  She was then brought to recovery room, extubated in stable condition, tolerating the procedure  well.  MDanae Orleans PA-C was present and utilized as aEnvironmental consultantfor the entire case from  Preoperative positioning to management of the contralateral extremity and retractors to  General facilitation of the procedure.  He was also involved with primary wound closure.         MPietro CassisOAlvan Dame M.D.

## 2016-01-12 NOTE — Telephone Encounter (Signed)
pts daughter is aware of the decrease  Pt is currently in the hospital she fell and broke her hip and is having surgery now

## 2016-01-12 NOTE — Progress Notes (Signed)
CRITICAL VALUE ALERT  Critical value received:  Troponin 0.10  Date of notification:  01/12/2016  Time of notification:  1950  Critical value read back:Yes.    Nurse who received alert:  Carnella Guadalajara I  MD notified (1st page):  Tylene Fantasia, NP  Time of first page:  2110  No new orders given at this time.

## 2016-01-12 NOTE — Progress Notes (Addendum)
[facility] PROGRESS NOTE  Dawn [name] SFK:[id number] DOB: 04/16/1940 DOA: 01/11/2016   PCP: [name], MD     Assessment/Plan: Principal Problem:   Subcapital fracture of right hip, closed, initial encounter ([place]) Active Problems:   Anemia, iron deficiency   Chronic pain syndrome   COPD (chronic obstructive pulmonary disease) (HCC)   CKD (chronic kidney disease) stage 3, GFR 30-59 ml/min   Hyperthyroidism   Protein-calorie malnutrition, severe   Dawn [name] is a 76 y.o. female with a past medical history significant for hyperthyroidism on methimazole, COPD not on home O2 and chronic pain who presents with hip pain after a fall.  Assessment and Plan: 1. Hip fracture: per Dr. Alvan Dame, operative fixation of the RIGHT hip.   Continue med-surg bed -Hydrocodone-acetaminophen or morphine as tolerated for pain -Bed rest, apply ice, document sedation and vitals per Hip fracture protocol -NPO currently  Overall, the patient is at low risk for the planned surgery.  Patient has a RCRI score of 0 (active cardiac condition, CHF, CAD, DM treated with insulin, TIA/CVA, Cr > 2.0). The patient has no active cardiac symptoms, and although she is sedentary and does not typically exert to a functional capacity >4 METs, she would be expected to be at average risk for cardiac complications from this intermediate risk procedure.    Atrial fibrillation -developed a fib post op ,hr in the 160's Transferred to step down on cardizem gtt Troponin times 3 Has a hx of SVT ,seen by Dr Daneen Schick 12/24/15 Will consult cardiology if no improvement on gtt   2. 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  This appears to have clinically stabilized. -Continue methimazole. Most recent TSH 4.9, free T4 within normal limits Continue metoprolol 12.5 mg BID given soft BP and hold parameters for hypotension  3. Iron deficiency anemia: -Continue iron supplement -Transfusion threshold 7  g/dL  4. COPD: -Continue Incruse  5. CKD III, baseline Cr 1-1.2: -IVF and close monitoring of renal function  6. Other medications: -Continue bupropion -Hold alprazolam and trazodone unless required  7. UTI-will start empirically on Rocephin, follow urine culture results  DVT prophylaxsis to be determined by orthopedics after surgery   Code Status:  Full code    Family Communication: Discussed in detail with the patient, all imaging results, lab results explained to the patient   Disposition Plan: transferred to step down due to a fib with rvr      Consultants:  Orthopedics  Procedures:  None  Antibiotics: Anti-infectives    None         HPI/Subjective: Denies any significant amount of pain this morning, resting comfortably in bed  Objective: Vitals:   01/12/16 0010 01/12/16 0115 01/12/16 0607 01/12/16 0944  BP: 94/59 (!) 97/50 (!) 106/51   Pulse: 76 71 72   Resp: '19 20 19   '$ Temp:  98.7 F (37.1 C) 98.5 F (36.9 C)   TempSrc:  Oral Oral   SpO2: 98% 100% 98% 98%  Weight:      Height:        Intake/Output Summary (Last 24 hours) at 01/12/16 1216 Last data filed at 01/12/16 0917  Gross per 24 hour  Intake           451.67 ml  Output                0 ml  Net           451.67 ml    Exam:  Examination:  General exam: Appears calm and comfortable  Respiratory system: Clear to auscultation. Respiratory effort normal. Cardiovascular system: S1 & S2 heard, RRR. No JVD, murmurs, rubs, gallops or clicks. No pedal edema. Gastrointestinal system: Abdomen is nondistended, soft and nontender. No organomegaly or masses felt. Normal bowel sounds heard. Central nervous system: Alert and oriented. No focal neurological deficits.  Right hip: She exhibits decreased range of motion, decreased strength, tenderness, bony tenderness and deformity. She exhibits no laceration.  Skin: No rashes, lesions or ulcers Psychiatry: Judgement and insight appear normal.  Mood & affect appropriate.     Data Reviewed: I have personally reviewed following labs and imaging studies  Micro Results Recent Results (from the past 240 hour(s))  Surgical PCR screen     Status: None   Collection Time: 01/12/16  4:55 AM  Result Value Ref Range Status   MRSA, PCR NEGATIVE NEGATIVE Final   Staphylococcus aureus NEGATIVE NEGATIVE Final    Comment:        The Xpert SA Assay (FDA approved for NASAL specimens in patients over 48 years of age), is one component of a comprehensive surveillance program.  Test performance has been validated by [facility] for patients greater than or equal to 10 year old. It is not intended to diagnose infection nor to guide or monitor treatment.     Radiology Reports Dg Chest 1 View  Result Date: 01/11/2016 CLINICAL DATA:  Right hip pain status post fall EXAM: CHEST 1 VIEW COMPARISON:  12/03/2015 FINDINGS: Lungs are hyperinflated with emphysematous changes. Probable mild fibrosis within the bilateral lung bases. No acute consolidation or effusion. Stable cardiomegaly. Atherosclerosis. No pneumothorax. Old right clavicle fracture. Multiple old right rib fractures. IMPRESSION: 1. Hyperinflation with emphysematous changes.  No acute infiltrate. 2. Stable cardiomegaly. Electronically Signed   By: Donavan Foil M.D.   On: 01/11/2016 22:09   Dg Hip Unilat W Or Wo Pelvis 2-3 Views Right  Result Date: 01/11/2016 CLINICAL DATA:  [place] at home while walking from continue bedroom. EXAM: DG HIP (WITH OR WITHOUT PELVIS) 2-3V RIGHT COMPARISON:  None FINDINGS: There is an acute subcapital right hip fracture with mild varus angulation. No dislocation. No radiographic findings to suggest a pathologic basis for the fracture. IMPRESSION: Subcapital right hip fracture Electronically Signed   By: Andreas Newport M.D.   On: 01/11/2016 22:08     CBC  Recent Labs Lab 01/11/16 2145 01/12/16 0423  WBC 10.8* 8.3  HGB 9.6* 9.0*  HCT 30.5* 28.0*  PLT 223  178  MCV 93.3 95.6  MCH 29.4 30.7  MCHC 31.5 32.1  RDW 17.2* 17.6*  LYMPHSABS 0.6*  --   MONOABS 0.8  --   EOSABS 0.1  --   BASOSABS 0.0  --     Chemistries   Recent Labs Lab 01/11/16 2145 01/12/16 0423  NA 135 138  K 3.9 4.0  CL 106 109  CO2 24 24  GLUCOSE 104* 101*  BUN 22* 19  CREATININE 1.43* 1.10*  CALCIUM 7.9* 7.5*   ------------------------------------------------------------------------------------------------------------------ estimated creatinine clearance is 38 mL/min (by C-G formula based on SCr of 1.1 mg/dL (H)). ------------------------------------------------------------------------------------------------------------------ No results for input(s): HGBA1C in the last 72 hours. ------------------------------------------------------------------------------------------------------------------ No results for input(s): CHOL, HDL, LDLCALC, TRIG, CHOLHDL, LDLDIRECT in the last 72 hours. ------------------------------------------------------------------------------------------------------------------ No results for input(s): TSH, T4TOTAL, T3FREE, THYROIDAB in the last 72 hours.  Invalid input(s): FREET3 ------------------------------------------------------------------------------------------------------------------ No results for input(s): VITAMINB12, FOLATE, FERRITIN, TIBC, IRON, RETICCTPCT in the last 72 hours.  Coagulation profile  Recent Labs Lab 01/11/16 2145  INR 1.21    No results for input(s): DDIMER in the last 72 hours.  Cardiac Enzymes No results for input(s): CKMB, TROPONINI, MYOGLOBIN in the last 168 hours.  Invalid input(s): CK ------------------------------------------------------------------------------------------------------------------ Invalid input(s): POCBNP   CBG: No results for input(s): GLUCAP in the last 168 hours.     Studies: Dg Chest 1 View  Result Date: 01/11/2016 CLINICAL DATA:  Right hip pain status post fall  EXAM: CHEST 1 VIEW COMPARISON:  12/03/2015 FINDINGS: Lungs are hyperinflated with emphysematous changes. Probable mild fibrosis within the bilateral lung bases. No acute consolidation or effusion. Stable cardiomegaly. Atherosclerosis. No pneumothorax. Old right clavicle fracture. Multiple old right rib fractures. IMPRESSION: 1. Hyperinflation with emphysematous changes.  No acute infiltrate. 2. Stable cardiomegaly. Electronically Signed   By: Donavan Foil M.D.   On: 01/11/2016 22:09   Dg Hip Unilat W Or Wo Pelvis 2-3 Views Right  Result Date: 01/11/2016 CLINICAL DATA:  [place] [address] at home while walking from continue bedroom. EXAM: DG HIP (WITH OR WITHOUT PELVIS) 2-3V RIGHT COMPARISON:  None FINDINGS: There is an acute subcapital right hip fracture with mild varus angulation. No dislocation. No radiographic findings to suggest a pathologic basis for the fracture. IMPRESSION: Subcapital right hip fracture Electronically Signed   By: Andreas Newport M.D.   On: 01/11/2016 22:08      Lab Results  Component Value Date   HGBA1C 5.2 12/01/2015   HGBA1C 5.5 11/10/2015   Lab Results  Component Value Date   LDLCALC 59 12/01/2015   CREATININE 1.10 (H) 01/12/2016       Scheduled Meds: . buPROPion  150 mg Oral Daily  . docusate sodium  100 mg Oral BID  . Ferrous Fumarate  106 mg of iron Oral BID PC  . gabapentin  600 mg Oral TID  . [START ON 01/13/2016] Influenza vac split quadrivalent PF  0.5 mL Intramuscular Tomorrow-1000  . methimazole  10 mg Oral BID  . metoprolol tartrate  12.5 mg Oral BID  . umeclidinium bromide  1 puff Inhalation Daily   Continuous Infusions: . sodium chloride 100 mL/hr at 01/12/16 0444     LOS: 0 days    Time spent: >30 MINS    [facility]  [facility] Pager [phone]. If 7PM-7AM, please contact night-coverage at [email or web address], password [facility] 01/12/2016, 12:16 PM  LOS: 0 days

## 2016-01-12 NOTE — Interval H&P Note (Signed)
History and Physical Interval Note:  01/12/2016 1:15 PM  Dawn [name]  has presented today for surgery, with the diagnosis of hip fracture right  The various methods of treatment have been discussed with the patient and family. After consideration of risks, benefits and other options for treatment, the patient has consented to  Procedure(s): ARTHROPLASTY BIPOLAR HIP (HEMIARTHROPLASTY) (Right) as a surgical intervention .  The patient's history has been reviewed, patient examined, no change in status, stable for surgery.  I have reviewed the patient's chart and labs.  Questions were answered to the patient's satisfaction.     Mauri Pole

## 2016-01-12 NOTE — H&P (View-Only) (Signed)
Reason for Consult:    Acute subcapital right hip fracture  Referring Physician:   ED physician  Dawn [name] is an 76 y.o. female.  HPI:   Presented to the ER yesterday after experiencing a fall from standing onto the right hip.  Patient had immediate pain and unable to bear weight.  In the ER x-rays revealed av acute subcapital right hip fracture with mild varus angulation.  Orthopaedics was consulted.  Dr. Alvan Dame was then consulted to do surgery for the patient.  Risks, benefits and expectations were discussed with the patient/family.  Risks including but not limited to the risk of anesthesia, blood clots, nerve damage, blood vessel damage, failure of the prosthesis, infection and up to and including death.  Patient/family understand the risks, benefits and expectations and wishes to proceed with surgery.    Past Medical History:  Diagnosis Date  . COPD (chronic obstructive pulmonary disease) ([place])   . History of anxiety   . History of depression   . Spinal stenosis   . UTI (urinary tract infection)     Past Surgical History:  Procedure Laterality Date  . FLEXIBLE SIGMOIDOSCOPY Left 11/18/2015   Procedure: FLEXIBLE SIGMOIDOSCOPY;  Surgeon: [name], MD;  Location: [facility];  Service: Endoscopy;  Laterality: Left;  . FLEXIBLE SIGMOIDOSCOPY N/A 11/20/2015   Procedure: FLEXIBLE SIGMOIDOSCOPY;  Surgeon: [name], MD;  Location: [facility] ENDOSCOPY;  Service: Endoscopy;  Laterality: N/A;  . NO PAST SURGERIES      Family History  Problem Relation Age of Onset  . Heart attack Mother   . Diabetes Father   . Thyroid disease Neg Hx     Social History:  reports that she has quit smoking. She has never used smokeless tobacco. She reports that she does not drink alcohol or use drugs.  Allergies: No Known Allergies   Results for orders placed or performed during the hospital encounter of 01/11/16 (from the past 48 hour(s))  CBC with Differential/Platelet     Status: Abnormal   Collection Time: 01/11/16  9:45 PM  Result Value Ref Range   WBC 10.8 (H) 4.0 - 10.5 K/uL   RBC 3.27 (L) 3.87 - 5.11 MIL/uL   Hemoglobin 9.6 (L) 12.0 - 15.0 g/dL   HCT 30.5 (L) 36.0 - 46.0 %   MCV 93.3 78.0 - 100.0 fL   MCH 29.4 26.0 - 34.0 pg   MCHC 31.5 30.0 - 36.0 g/dL   RDW 17.2 (H) 11.5 - 15.5 %   Platelets 223 150 - 400 K/uL   Neutrophils Relative % 86 %   Lymphocytes Relative 6 %   Monocytes Relative 7 %   Eosinophils Relative 1 %   Basophils Relative 0 %   Neutro Abs 9.3 (H) 1.7 - 7.7 K/uL   Lymphs Abs 0.6 (L) 0.7 - 4.0 K/uL   Monocytes Absolute 0.8 0.1 - 1.0 K/uL   Eosinophils Absolute 0.1 0.0 - 0.7 K/uL   Basophils Absolute 0.0 0.0 - 0.1 K/uL   Smear Review MORPHOLOGY UNREMARKABLE   Basic metabolic panel     Status: Abnormal   Collection Time: 01/11/16  9:45 PM  Result Value Ref Range   Sodium 135 135 - 145 mmol/L   Potassium 3.9 3.5 - 5.1 mmol/L   Chloride 106 101 - 111 mmol/L   CO2 24 22 - 32 mmol/L   Glucose, Bld 104 (H) 65 - 99 mg/dL   BUN 22 (H) 6 - 20 mg/dL   Creatinine, Ser 1.43 (H) 0.44 -  1.00 mg/dL   Calcium 7.9 (L) 8.9 - 10.3 mg/dL   GFR calc non Af Amer 35 (L) >60 mL/min   GFR calc Af Amer 40 (L) >60 mL/min    Comment: (NOTE) The eGFR has been calculated using the CKD EPI equation. This calculation has not been validated in all clinical situations. eGFR's persistently <60 mL/min signify possible Chronic Kidney Disease.    Anion gap 5 5 - 15  Protime-INR     Status: Abnormal   Collection Time: 01/11/16  9:45 PM  Result Value Ref Range   Prothrombin Time 15.4 (H) 11.4 - 15.2 seconds   INR 1.21   CBC     Status: Abnormal   Collection Time: 01/12/16  4:23 AM  Result Value Ref Range   WBC 8.3 4.0 - 10.5 K/uL   RBC 2.93 (L) 3.87 - 5.11 MIL/uL   Hemoglobin 9.0 (L) 12.0 - 15.0 g/dL   HCT 28.0 (L) 36.0 - 46.0 %   MCV 95.6 78.0 - 100.0 fL   MCH 30.7 26.0 - 34.0 pg   MCHC 32.1 30.0 - 36.0 g/dL   RDW 17.6 (H) 11.5 - 15.5 %   Platelets 178 150 - 400  K/uL  Basic metabolic panel     Status: Abnormal   Collection Time: 01/12/16  4:23 AM  Result Value Ref Range   Sodium 138 135 - 145 mmol/L   Potassium 4.0 3.5 - 5.1 mmol/L   Chloride 109 101 - 111 mmol/L   CO2 24 22 - 32 mmol/L   Glucose, Bld 101 (H) 65 - 99 mg/dL   BUN 19 6 - 20 mg/dL   Creatinine, Ser 1.10 (H) 0.44 - 1.00 mg/dL   Calcium 7.5 (L) 8.9 - 10.3 mg/dL   GFR calc non Af Amer 48 (L) >60 mL/min   GFR calc Af Amer 55 (L) >60 mL/min    Comment: (NOTE) The eGFR has been calculated using the CKD EPI equation. This calculation has not been validated in all clinical situations. eGFR's persistently <60 mL/min signify possible Chronic Kidney Disease.    Anion gap 5 5 - 15  Type and screen Laurie     Status: None   Collection Time: 01/12/16  4:23 AM  Result Value Ref Range   ABO/RH(D) O POS    Antibody Screen NEG    Sample Expiration 01/15/2016   ABO/Rh     Status: None   Collection Time: 01/12/16  4:23 AM  Result Value Ref Range   ABO/RH(D) O POS   Urinalysis, Routine w reflex microscopic     Status: Abnormal   Collection Time: 01/12/16  4:52 AM  Result Value Ref Range   Color, Urine AMBER (A) YELLOW    Comment: BIOCHEMICALS MAY BE AFFECTED BY COLOR   APPearance CLOUDY (A) CLEAR   Specific Gravity, Urine 1.015 1.005 - 1.030   pH 5.0 5.0 - 8.0   Glucose, UA NEGATIVE NEGATIVE mg/dL   Hgb urine dipstick SMALL (A) NEGATIVE   Bilirubin Urine NEGATIVE NEGATIVE   Ketones, ur NEGATIVE NEGATIVE mg/dL   Protein, ur 30 (A) NEGATIVE mg/dL   Nitrite POSITIVE (A) NEGATIVE   Leukocytes, UA MODERATE (A) NEGATIVE   RBC / HPF 0-5 0 - 5 RBC/hpf   WBC, UA TOO NUMEROUS TO COUNT 0 - 5 WBC/hpf   Bacteria, UA MANY (A) NONE SEEN   Squamous Epithelial / LPF 0-5 (A) NONE SEEN    Dg Chest 1 View  Result Date:  01/11/2016 CLINICAL DATA:  Right hip pain status post fall EXAM: CHEST 1 VIEW COMPARISON:  12/03/2015 FINDINGS: Lungs are hyperinflated with emphysematous  changes. Probable mild fibrosis within the bilateral lung bases. No acute consolidation or effusion. Stable cardiomegaly. Atherosclerosis. No pneumothorax. Old right clavicle fracture. Multiple old right rib fractures. IMPRESSION: 1. Hyperinflation with emphysematous changes.  No acute infiltrate. 2. Stable cardiomegaly. Electronically Signed   By: Donavan Foil M.D.   On: 01/11/2016 22:09   Dg Hip Unilat W Or Wo Pelvis 2-3 Views Right  Result Date: 01/11/2016 CLINICAL DATA:  [address] [address] at home while walking from continue bedroom. EXAM: DG HIP (WITH OR WITHOUT PELVIS) 2-3V RIGHT COMPARISON:  None FINDINGS: There is an acute subcapital right hip fracture with mild varus angulation. No dislocation. No radiographic findings to suggest a pathologic basis for the fracture. IMPRESSION: Subcapital right hip fracture Electronically Signed   By: Andreas Newport M.D.   On: 01/11/2016 22:08    Review of Systems  Constitutional: Negative.   HENT: Negative.   Eyes: Negative.   Respiratory: Positive for shortness of breath (with exertion).   Cardiovascular: Negative.   Gastrointestinal: Negative.   Genitourinary: Negative.   Musculoskeletal: Positive for joint pain.  Skin: Negative.   Neurological: Negative.   Endo/Heme/Allergies: Negative.   Psychiatric/Behavioral: Positive for depression. The patient is nervous/anxious.    Blood pressure (!) 106/51, pulse 72, temperature 98.5 F (36.9 C), temperature source Oral, resp. rate 19, height 5' 8" (1.727 m), weight 55.3 kg (122 lb), SpO2 98 %. Physical Exam  Constitutional: She appears well-developed.  HENT:  Head: Normocephalic.  Eyes: Pupils are equal, round, and reactive to light.  Neck: Neck supple. No JVD present. No tracheal deviation present. No thyromegaly present.  Cardiovascular: Normal rate, regular rhythm and intact distal pulses.   Respiratory: Effort normal and breath sounds normal. No respiratory distress. She has no wheezes.  GI: Soft. There  is no tenderness. There is no guarding.  Musculoskeletal:       Right hip: She exhibits decreased range of motion, decreased strength, tenderness, bony tenderness and deformity. She exhibits no laceration.  Lymphadenopathy:    She has no cervical adenopathy.  Neurological: She is alert.  Skin: Skin is warm and dry.  Psychiatric: She has a normal mood and affect.    Assessment/Plan: Acute subcapital right hip fracture    NPO as of midnight/now Order placed to obtain consent for a right hip hemiarthroplasty, per Dr. Alvan Dame. Surgery planned for later today.     Pricilla Loveless 01/12/2016, 9:47 AM

## 2016-01-12 NOTE — Progress Notes (Signed)
Patient transferred form 6E. Tele monitor applied HR in 130s. Spoke with Dr. Allyson Sabal, gave order for '10mg'$  IV push of Cardizem and wrote orders for patient to be transferred to SDU and a Cardizem drip. IV push given patient HR converted to NSR in the 80s. Dr. Allyson Sabal notified and still wants patient moved to SDU. Per Dr. Allyson Sabal leave Cardizem drip order in case she goes back into afib but do not administer at this time.

## 2016-01-12 NOTE — Progress Notes (Signed)
Patient was just picked up for surgery. IV flushed and saline locked.

## 2016-01-12 NOTE — Anesthesia Preprocedure Evaluation (Signed)
Anesthesia Evaluation  Patient identified by MRN, date of birth, ID band Patient awake    Reviewed: Allergy & Precautions, NPO status , Patient's Chart, lab work & pertinent test results  Airway Mallampati: II  TM Distance: >3 FB Neck ROM: Full    Dental no notable dental hx.    Pulmonary COPD, former smoker,    Pulmonary exam normal breath sounds clear to auscultation       Cardiovascular hypertension, Pt. on medications and Pt. on home beta blockers Normal cardiovascular exam Rhythm:Regular Rate:Normal     Neuro/Psych Anxiety Depression Spinal stenosis negative neurological ROS  negative psych ROS   GI/Hepatic negative GI ROS, Neg liver ROS,   Endo/Other  negative endocrine ROS  Renal/GU negative Renal ROS  negative genitourinary   Musculoskeletal negative musculoskeletal ROS (+)   Abdominal   Peds negative pediatric ROS (+)  Hematology negative hematology ROS (+)   Anesthesia Other Findings   Reproductive/Obstetrics negative OB ROS                             Anesthesia Physical Anesthesia Plan  ASA: II  Anesthesia Plan: General   Post-op Pain Management:    Induction: Intravenous  Airway Management Planned: Oral ETT  Additional Equipment:   Intra-op Plan:   Post-operative Plan: Extubation in OR  Informed Consent: I have reviewed the patients History and Physical, chart, labs and discussed the procedure including the risks, benefits and alternatives for the proposed anesthesia with the patient or authorized representative who has indicated his/her understanding and acceptance.   Dental advisory given  Plan Discussed with: CRNA  Anesthesia Plan Comments:         Anesthesia Quick Evaluation

## 2016-01-12 NOTE — ED Notes (Signed)
Pt being assisted with bedpan

## 2016-01-12 NOTE — Consult Note (Signed)
Reason for Consult:    Acute subcapital right hip fracture  Referring Physician:   ED physician  Dawn [name] is an 75 y.o. female.  HPI:   Presented to the ER yesterday after experiencing a fall from standing onto the right hip.  Patient had immediate pain and unable to bear weight.  In the ER x-rays revealed av acute subcapital right hip fracture with mild varus angulation.  Orthopaedics was consulted.  Dr. [name] was then consulted to do surgery for the patient.  Risks, benefits and expectations were discussed with the patient/family.  Risks including but not limited to the risk of anesthesia, blood clots, nerve damage, blood vessel damage, failure of the prosthesis, infection and up to and including death.  Patient/family understand the risks, benefits and expectations and wishes to proceed with surgery.    Past Medical History:  Diagnosis Date  . COPD (chronic obstructive pulmonary disease) (HCC)   . History of anxiety   . History of depression   . Spinal stenosis   . UTI (urinary tract infection)     Past Surgical History:  Procedure Laterality Date  . FLEXIBLE SIGMOIDOSCOPY Left 11/18/2015   Procedure: FLEXIBLE SIGMOIDOSCOPY;  Surgeon: John Hayes, MD;  Location: [facility];  Service: Endoscopy;  Laterality: Left;  . FLEXIBLE SIGMOIDOSCOPY N/A 11/20/2015   Procedure: FLEXIBLE SIGMOIDOSCOPY;  Surgeon: John Hayes, MD;  Location: [facility];  Service: Endoscopy;  Laterality: N/A;  . NO PAST SURGERIES      Family History  Problem Relation Age of Onset  . Heart attack Mother   . Diabetes Father   . Thyroid disease Neg Hx     Social History:  reports that she has quit smoking. She has never used smokeless tobacco. She reports that she does not drink alcohol or use drugs.  Allergies: No Known Allergies   Results for orders placed or performed during the hospital encounter of 01/11/16 (from the past 48 hour(s))  CBC with Differential/Platelet     Status: Abnormal   Collection Time: 01/11/16  9:45 PM  Result Value Ref Range   WBC 10.8 (H) 4.0 - 10.5 K/uL   RBC 3.27 (L) 3.87 - 5.11 MIL/uL   Hemoglobin 9.6 (L) 12.0 - 15.0 g/dL   HCT 30.5 (L) 36.0 - 46.0 %   MCV 93.3 78.0 - 100.0 fL   MCH 29.4 26.0 - 34.0 pg   MCHC 31.5 30.0 - 36.0 g/dL   RDW 17.2 (H) 11.5 - 15.5 %   Platelets 223 150 - 400 K/uL   Neutrophils Relative % 86 %   Lymphocytes Relative 6 %   Monocytes Relative 7 %   Eosinophils Relative 1 %   Basophils Relative 0 %   Neutro Abs 9.3 (H) 1.7 - 7.7 K/uL   Lymphs Abs 0.6 (L) 0.7 - 4.0 K/uL   Monocytes Absolute 0.8 0.1 - 1.0 K/uL   Eosinophils Absolute 0.1 0.0 - 0.7 K/uL   Basophils Absolute 0.0 0.0 - 0.1 K/uL   Smear Review MORPHOLOGY UNREMARKABLE   Basic metabolic panel     Status: Abnormal   Collection Time: 01/11/16  9:45 PM  Result Value Ref Range   Sodium 135 135 - 145 mmol/L   Potassium 3.9 3.5 - 5.1 mmol/L   Chloride 106 101 - 111 mmol/L   CO2 24 22 - 32 mmol/L   Glucose, Bld 104 (H) 65 - 99 mg/dL   BUN 22 (H) 6 - 20 mg/dL   Creatinine, Ser 1.43 (H) 0.44 -   1.00 mg/dL   Calcium 7.9 (L) 8.9 - 10.3 mg/dL   GFR calc non Af Amer 35 (L) >60 mL/min   GFR calc Af Amer 40 (L) >60 mL/min    Comment: (NOTE) The eGFR has been calculated using the CKD EPI equation. This calculation has not been validated in all clinical situations. eGFR's persistently <60 mL/min signify possible Chronic Kidney Disease.    Anion gap 5 5 - 15  Protime-INR     Status: Abnormal   Collection Time: 01/11/16  9:45 PM  Result Value Ref Range   Prothrombin Time 15.4 (H) 11.4 - 15.2 seconds   INR 1.21   CBC     Status: Abnormal   Collection Time: 01/12/16  4:23 AM  Result Value Ref Range   WBC 8.3 4.0 - 10.5 K/uL   RBC 2.93 (L) 3.87 - 5.11 MIL/uL   Hemoglobin 9.0 (L) 12.0 - 15.0 g/dL   HCT 28.0 (L) 36.0 - 46.0 %   MCV 95.6 78.0 - 100.0 fL   MCH 30.7 26.0 - 34.0 pg   MCHC 32.1 30.0 - 36.0 g/dL   RDW 17.6 (H) 11.5 - 15.5 %   Platelets 178 150 - 400  K/uL  Basic metabolic panel     Status: Abnormal   Collection Time: 01/12/16  4:23 AM  Result Value Ref Range   Sodium 138 135 - 145 mmol/L   Potassium 4.0 3.5 - 5.1 mmol/L   Chloride 109 101 - 111 mmol/L   CO2 24 22 - 32 mmol/L   Glucose, Bld 101 (H) 65 - 99 mg/dL   BUN 19 6 - 20 mg/dL   Creatinine, Ser 1.10 (H) 0.44 - 1.00 mg/dL   Calcium 7.5 (L) 8.9 - 10.3 mg/dL   GFR calc non Af Amer 48 (L) >60 mL/min   GFR calc Af Amer 55 (L) >60 mL/min    Comment: (NOTE) The eGFR has been calculated using the CKD EPI equation. This calculation has not been validated in all clinical situations. eGFR's persistently <60 mL/min signify possible Chronic Kidney Disease.    Anion gap 5 5 - 15  Type and screen [facility]     Status: None   Collection Time: 01/12/16  4:23 AM  Result Value Ref Range   ABO/RH(D) O POS    Antibody Screen NEG    Sample Expiration 01/15/2016   ABO/Rh     Status: None   Collection Time: 01/12/16  4:23 AM  Result Value Ref Range   ABO/RH(D) O POS   Urinalysis, Routine w reflex microscopic     Status: Abnormal   Collection Time: 01/12/16  4:52 AM  Result Value Ref Range   Color, Urine AMBER (A) YELLOW    Comment: BIOCHEMICALS MAY BE AFFECTED BY COLOR   APPearance CLOUDY (A) CLEAR   Specific Gravity, Urine 1.015 1.005 - 1.030   pH 5.0 5.0 - 8.0   Glucose, UA NEGATIVE NEGATIVE mg/dL   Hgb urine dipstick SMALL (A) NEGATIVE   Bilirubin Urine NEGATIVE NEGATIVE   Ketones, ur NEGATIVE NEGATIVE mg/dL   Protein, ur 30 (A) NEGATIVE mg/dL   Nitrite POSITIVE (A) NEGATIVE   Leukocytes, UA MODERATE (A) NEGATIVE   RBC / HPF 0-5 0 - 5 RBC/hpf   WBC, UA TOO NUMEROUS TO COUNT 0 - 5 WBC/hpf   Bacteria, UA MANY (A) NONE SEEN   Squamous Epithelial / LPF 0-5 (A) NONE SEEN    Dg Chest 1 View  Result Date:   01/11/2016 CLINICAL DATA:  Right hip pain status post fall EXAM: CHEST 1 VIEW COMPARISON:  12/03/2015 FINDINGS: Lungs are hyperinflated with emphysematous  changes. Probable mild fibrosis within the bilateral lung bases. No acute consolidation or effusion. Stable cardiomegaly. Atherosclerosis. No pneumothorax. Old right clavicle fracture. Multiple old right rib fractures. IMPRESSION: 1. Hyperinflation with emphysematous changes.  No acute infiltrate. 2. Stable cardiomegaly. Electronically Signed   By: Donavan Foil M.D.   On: 01/11/2016 22:09   Dg Hip Unilat W Or Wo Pelvis 2-3 Views Right  Result Date: 01/11/2016 CLINICAL DATA:  [address] at home while walking from continue bedroom. EXAM: DG HIP (WITH OR WITHOUT PELVIS) 2-3V RIGHT COMPARISON:  None FINDINGS: There is an acute subcapital right hip fracture with mild varus angulation. No dislocation. No radiographic findings to suggest a pathologic basis for the fracture. IMPRESSION: Subcapital right hip fracture Electronically Signed   By: Andreas Newport M.D.   On: 01/11/2016 22:08    Review of Systems  Constitutional: Negative.   HENT: Negative.   Eyes: Negative.   Respiratory: Positive for shortness of breath (with exertion).   Cardiovascular: Negative.   Gastrointestinal: Negative.   Genitourinary: Negative.   Musculoskeletal: Positive for joint pain.  Skin: Negative.   Neurological: Negative.   Endo/Heme/Allergies: Negative.   Psychiatric/Behavioral: Positive for depression. The patient is nervous/anxious.    Blood pressure (!) 106/51, pulse 72, temperature 98.5 F (36.9 C), temperature source Oral, resp. rate 19, height 5' 8" (1.727 m), weight 55.3 kg (122 lb), SpO2 98 %. Physical Exam  Constitutional: She appears well-developed.  HENT:  Head: Normocephalic.  Eyes: Pupils are equal, round, and reactive to light.  Neck: Neck supple. No JVD present. No tracheal deviation present. No thyromegaly present.  Cardiovascular: Normal rate, regular rhythm and intact distal pulses.   Respiratory: Effort normal and breath sounds normal. No respiratory distress. She has no wheezes.  GI: Soft. There  is no tenderness. There is no guarding.  Musculoskeletal:       Right hip: She exhibits decreased range of motion, decreased strength, tenderness, bony tenderness and deformity. She exhibits no laceration.  Lymphadenopathy:    She has no cervical adenopathy.  Neurological: She is alert.  Skin: Skin is warm and dry.  Psychiatric: She has a normal mood and affect.    Assessment/Plan: Acute subcapital right hip fracture    NPO as of midnight/now Order placed to obtain consent for a right hip hemiarthroplasty, per Dr. Alvan Dame. Surgery planned for later today.     Pricilla Loveless 01/12/2016, 9:47 AM

## 2016-01-12 NOTE — ED Notes (Signed)
Dr. Loleta Books at bedside to evaluate pt.

## 2016-01-12 NOTE — ED Notes (Signed)
Pt assisted with female urinal awaiting admission orders.

## 2016-01-12 NOTE — H&P (Signed)
History and Physical  Patient Name: Dawn [name]     KXF:[id number]    DOB: 10-26-1940    DOA: 01/11/2016 Referring provider: Leo Grosser, MD PCP: [name], MD  Outpatient specialists:  Dr. Dwyane Dee, Endocrinology     Dr. Tamala Julian, Cardiology   Patient coming from: Home     Chief Complaint: Hip pain and fall  HPI: Dawn [name] is a 76 y.o. female with a past medical history significant for hyperthyroidism on methimazole, COPD not on home O2 and chronic pain who presents with hip pain after a fall.  The patient was in her normal state of health until this evening when she was getting some milk from the fridge and was unsteady on her feet and fell and had immediate RIGHT hip pain and could not walk.    ED course:  -Afebrile, heart rate 80, respirations 23, BP 94/61, pulse ox normal -Na 135, K 3.9, Cr 1.43 (baseline 1-1.2), WBC 10.8K, Hgb 9.6 (improving from last discharge) -Plain radiograph of the RIGHT hip showed a subcapital fracture.   -The case was discussed with Dr. Ninfa Linden who agreed to see the patient, and [facility] were asked to admit for medical management.    The patient is a somewhat vague historian, but denies that there was preceding dizziness, weakness, lightheadedness or vertigo, no chest discomfort, palpitations, or dyspnea, nor are there any of those symptoms now.  The patient denies active heart issues, angina, or history of MI. She does not typically exert to an equivalent of 4 METS, but believes that she could without dyspnea.  She denies history of TIAs/CVAs, CAD, CHF, or DM treated with insulin. She has COPD but does not have O2 requirement of recent active symptoms, and no history of OSA, including snoring, daytime drowsiness, apnea.  She has no history of prolonged steroid use and does not use insulin.  She does not use alcohol, and has no history of withdrawal symptoms or delirium in the context of previous surgeries.  Anesthesia Specific  concerns: Presence of loose teeth: Partial plates on top, dentures bottom Anesthesia problems in past: None (only had colonoscopies in the past) History of bleeding disorder: None  Review of Systems:  Review of Systems  Respiratory: Negative for cough, shortness of breath and wheezing.   Cardiovascular: Negative for chest pain.  Musculoskeletal: Positive for falls and joint pain.  Neurological: Negative for loss of consciousness.  All other systems reviewed and are negative.         Past Medical History:  Diagnosis Date  . COPD (chronic obstructive pulmonary disease) ([place])   . History of anxiety   . History of depression   . Spinal stenosis   . UTI (urinary tract infection)     Past Surgical History:  Procedure Laterality Date  . FLEXIBLE SIGMOIDOSCOPY Left 11/18/2015   Procedure: FLEXIBLE SIGMOIDOSCOPY;  Surgeon: [name], MD;  Location: [facility];  Service: Endoscopy;  Laterality: Left;  . FLEXIBLE SIGMOIDOSCOPY N/A 11/20/2015   Procedure: FLEXIBLE SIGMOIDOSCOPY;  Surgeon: [name], MD;  Location: [facility] ENDOSCOPY;  Service: Endoscopy;  Laterality: N/A;  . NO PAST SURGERIES      Social History: Patient lives with her daughter.  Patient walks unassisted.  She is a retired Educational psychologist.  She is a former smoker.    No Known Allergies  Family history: family history includes Diabetes in her father; Heart attack in her mother.  Prior to Admission medications   Medication Sig Start Date End Date Taking? Authorizing Provider  ALPRAZolam (XANAX) 0.5 MG tablet Take 1 tablet (0.5 mg total) by mouth 2 (two) times daily as needed for anxiety. 12/03/15  Yes Domenic Polite, MD  buPROPion (WELLBUTRIN XL) 150 MG 24 hr tablet Take 150 mg by mouth daily.   Yes Historical Provider, MD  FERROCITE 324 MG TABS tablet Take 1 tablet (106 mg of iron total) by mouth 2 (two) times daily after a meal. 12/03/15  Yes Domenic Polite, MD  gabapentin (NEURONTIN) 600 MG tablet Take 600 mg by mouth 3  (three) times daily.   Yes Historical Provider, MD  INCRUSE ELLIPTA 62.5 MCG/INH AEPB Inhale 1 puff into the lungs daily. 10/19/15  Yes Historical Provider, MD  loperamide (IMODIUM) 2 MG capsule Take 4 mg by mouth as needed for diarrhea or loose stools.   Yes Historical Provider, MD  methimazole (TAPAZOLE) 10 MG tablet Take 10 mg by mouth 2 (two) times daily.   Yes Historical Provider, MD  metoprolol tartrate (LOPRESSOR) 25 MG tablet Take 25 mg by mouth 2 (two) times daily.   Yes Historical Provider, MD  oxyCODONE (OXY IR/ROXICODONE) 5 MG immediate release tablet Take 1 tablet (5 mg total) by mouth every 6 (six) hours as needed for moderate pain. 12/03/15  Yes Domenic Polite, MD  methimazole (TAPAZOLE) 10 MG tablet Take 1.5 tablets (15 mg total) by mouth 2 (two) times daily. 12/03/15 01/02/16  Domenic Polite, MD       Physical Exam: BP 94/59 (BP Location: Left Arm)   Pulse 76   Temp 97.8 F (36.6 C) (Oral)   Resp 19   Ht '5\' 8"'$  (1.727 m)   Wt 55.3 kg (122 lb)   SpO2 98%   BMI 18.55 kg/m  General appearance: Thin elderly frail appearing female, alert and in no acute distress.   Eyes: Anicteric, conjunctiva pink, lids and lashes normal. PERRL.    ENT: No nasal deformity, discharge, epistaxis.  Hearing normal. OP moist without lesions.      Lymph: No cervical, supraclavicular or axillary lymphadenopathy. Skin: Warm and dry.  No jaundice.  No suspicious rashes or lesions. Cardiac: RRR, nl S1-S2, no murmurs appreciated.  Capillary refill is brisk.  JVP normal.  No LE edema.  Radial and DP pulses 2+ and symmetric.   Respiratory: Normal respiratory rate and rhythm.  CTAB without rales or wheezes. GI: Abdomen soft without rigidity.  No TTP. No ascites, distension, no hepatosplenomegaly.  MSK: RIGHT leg foreshortened and externally rotated.  No effusions.  No clubbing or cyanosis. Neuro: Cranial nerves 3-12 intact.  Sensorium intact and responding to questions, attention normal.  Speech is  fluent.  Moves all extremities equally and with normal coordination.    Psych: The patient is oriented to time, place and person. Behavior appropriate just a little dazed.  Affect normal.  Recall, recent and remote, as well as general fund of knowledge seem within normal limits. No evidence of aural or visual hallucinations or delusions.     Labs on Admission:  I have personally reviewed following labs and imaging studies: CBC:  Recent Labs Lab 01/11/16 2145  WBC 10.8*  NEUTROABS 9.3*  HGB 9.6*  HCT 30.5*  MCV 93.3  PLT 315   Basic Metabolic Panel:  Recent Labs Lab 01/11/16 2145  NA 135  K 3.9  CL 106  CO2 24  GLUCOSE 104*  BUN 22*  CREATININE 1.43*  CALCIUM 7.9*   GFR: Estimated Creatinine Clearance: 29.2 mL/min (by C-G formula based on SCr of 1.43 mg/dL (H)).  Liver Function Tests: No results for input(s): AST, ALT, ALKPHOS, BILITOT, PROT, ALBUMIN in the last 168 hours. No results for input(s): LIPASE, AMYLASE in the last 168 hours. No results for input(s): AMMONIA in the last 168 hours. Coagulation Profile:  Recent Labs Lab 01/11/16 2145  INR 1.21   Cardiac Enzymes: No results for input(s): CKTOTAL, CKMB, CKMBINDEX, TROPONINI in the last 168 hours. BNP (last 3 results) No results for input(s): PROBNP in the last 8760 hours. HbA1C: No results for input(s): HGBA1C in the last 72 hours. CBG: No results for input(s): GLUCAP in the last 168 hours. Lipid Profile: No results for input(s): CHOL, HDL, LDLCALC, TRIG, CHOLHDL, LDLDIRECT in the last 72 hours. Thyroid Function Tests: No results for input(s): TSH, T4TOTAL, FREET4, T3FREE, THYROIDAB in the last 72 hours. Anemia Panel: No results for input(s): VITAMINB12, FOLATE, FERRITIN, TIBC, IRON, RETICCTPCT in the last 72 hours.    Radiological Exams on Admission: Personally reviewed: Dg Chest 1 View  Result Date: 01/11/2016 CLINICAL DATA:  Right hip pain status post fall EXAM: CHEST 1 VIEW COMPARISON:   12/03/2015 FINDINGS: Lungs are hyperinflated with emphysematous changes. Probable mild fibrosis within the bilateral lung bases. No acute consolidation or effusion. Stable cardiomegaly. Atherosclerosis. No pneumothorax. Old right clavicle fracture. Multiple old right rib fractures. IMPRESSION: 1. Hyperinflation with emphysematous changes.  No acute infiltrate. 2. Stable cardiomegaly. Electronically Signed   By: Donavan Foil M.D.   On: 01/11/2016 22:09   Dg Hip Unilat W Or Wo Pelvis 2-3 Views Right  Result Date: 01/11/2016 CLINICAL DATA:  [address] at home while walking from continue bedroom. EXAM: DG HIP (WITH OR WITHOUT PELVIS) 2-3V RIGHT COMPARISON:  None FINDINGS: There is an acute subcapital right hip fracture with mild varus angulation. No dislocation. No radiographic findings to suggest a pathologic basis for the fracture. IMPRESSION: Subcapital right hip fracture Electronically Signed   By: Andreas Newport M.D.   On: 01/11/2016 22:08        Assessment and Plan: 1. Hip fracture: The patient will be seen by Dr. Ninfa Linden in the morning at [facility], to evaluate for operative fixation of the RIGHT hip.   -Admit to med-surg bed -Hydrocodone-acetaminophen or morphine as tolerated for pain -Bed rest, apply ice, document sedation and vitals per Hip fracture protocol -NPO at midnight -MIVF -Nutrition consulted      Overall, the patient is at low risk for the planned surgery.  Patient has a RCRI score of 0 (active cardiac condition, CHF, CAD, DM treated with insulin, TIA/CVA, Cr > 2.0). The patient has no active cardiac symptoms, and although she is sedentary and does not typically exert to a functional capacity >4 METs, she would be expected to be at average risk for cardiac complications from this intermediate risk procedure. -No further testing needed  Pulmonary: COPD, mild hypoxia noted in ER, after fentanyl administration  Endocrine: Patient has no history of steroid use or DM.  Heme:  Transfusion threshold 7 mg/dL.  Post-operative medical care: Per AAOS 2014 guidelines on hip fractures in the elderly: -Recommend osteoporosis screening after discharge if not done previously -Recommend vitamin D 800 IUand calcium 1200 mg supplementation after discharge     2. 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  This appears to have clinically stabilized. -Continue methimazole -Higher risk for post-operative tachcyardias -May continue metoprolol, dose lowered to 12.5 mg BID given soft BP and hold parameters for hypotension  3. Iron deficiency anemia: -Continue iron supplement -Transfusion threshold 7 g/dL  4. COPD: -Continue Incruse  5. CKD III, baseline Cr 1-1.2: -IVF and close monitoring of renal function  6. Other medications: -Continue bupropion -Hold alprazolam and trazodone unless required   DVT prophylaxis: SCDs  Diet: NPO Code Status: FULL  Family Communication: Daughter by phone  Disposition Plan: Anticipate evaluation by Orthopedics and surgical fixation tomorrow, then PT evaluation and discharge to SNF in 2-3 days. Admission status: INPATIENT for hip fracture, medical surgical bed     Medical decision making and consults: Patient seen at 12:37 AM on 01/12/2016.  The patient was discussed with Dr. Laneta Simmers. What exists of the patient's chart was reviewed in depth and summarized above.  Clinical condition: stable.      Edwin Dada [facility] Pager [phone]         At the time of admission, it appears that the appropriate admission status for this patient is INPATIENT. This is judged to be reasonable and necessary in order to provide the required intensity of service to ensure the patient's safety given the presenting symptoms, physical exam findings, and initial radiographic and laboratory data in the context of their chronic comorbidities.  Together, these circumstances are felt to place her at high risk for further clinical deterioration  threatening life, limb, or organ.   Patient requires inpatient status due to high intensity of service, high risk for further deterioration and high frequency of surveillance required because of this acute illness that poses a threat to life, limb or bodily function.  I certify that at the point of admission it is my clinical judgment that the patient will require inpatient hospital care spanning beyond 2 midnights from the point of admission and that early discharge would result in unnecessary risk of decompensation and readmission or threat to life, limb or bodily function.

## 2016-01-12 NOTE — Anesthesia Procedure Notes (Signed)
Procedure Name: Intubation Date/Time: 01/12/2016 2:15 PM Performed by: West Pugh Pre-anesthesia Checklist: Patient identified, Emergency Drugs available, Suction available, Patient being monitored and Timeout performed Patient Re-evaluated:Patient Re-evaluated prior to inductionOxygen Delivery Method: Circle system utilized Preoxygenation: Pre-oxygenation with 100% oxygen Intubation Type: IV induction Ventilation: Mask ventilation without difficulty Laryngoscope Size: Mac and 4 Grade View: Grade I Tube type: Oral Tube size: 7.0 mm Number of attempts: 1 Airway Equipment and Method: Stylet Placement Confirmation: ETT inserted through vocal cords under direct vision,  positive ETCO2,  CO2 detector and breath sounds checked- equal and bilateral Secured at: 19 cm Tube secured with: Tape Dental Injury: Teeth and Oropharynx as per pre-operative assessment

## 2016-01-12 NOTE — Transfer of Care (Signed)
Immediate Anesthesia Transfer of Care Note  Patient: Dawn [name]  Procedure(s) Performed: Procedure(s): ARTHROPLASTY BIPOLAR HIP (HEMIARTHROPLASTY) (Right)  Patient Location: PACU  Anesthesia Type:General  Level of Consciousness:  sedated, patient cooperative and responds to stimulation  Airway & Oxygen Therapy:Patient Spontanous Breathing and Patient connected to face mask oxgen  Post-op Assessment:  Report given to PACU RN and Post -op Vital signs reviewed and stable  Post vital signs:  Reviewed and stable  Last Vitals:  Vitals:   01/12/16 1539 01/12/16 1545  BP:  (!) 86/70  Pulse:  (!) 103  Resp: 18 20  Temp: 95.0 C     Complications: No apparent anesthesia complications

## 2016-01-13 DIAGNOSIS — D509 Iron deficiency anemia, unspecified: Secondary | ICD-10-CM

## 2016-01-13 DIAGNOSIS — I9581 Postprocedural hypotension: Secondary | ICD-10-CM

## 2016-01-13 DIAGNOSIS — I4891 Unspecified atrial fibrillation: Secondary | ICD-10-CM

## 2016-01-13 LAB — CORTISOL: CORTISOL PLASMA: 3.9 ug/dL

## 2016-01-13 LAB — CBC
HEMATOCRIT: 27.5 % — AB (ref 36.0–46.0)
HEMOGLOBIN: 8.8 g/dL — AB (ref 12.0–15.0)
MCH: 29.8 pg (ref 26.0–34.0)
MCHC: 32 g/dL (ref 30.0–36.0)
MCV: 93.2 fL (ref 78.0–100.0)
Platelets: 131 10*3/uL — ABNORMAL LOW (ref 150–400)
RBC: 2.95 MIL/uL — AB (ref 3.87–5.11)
RDW: 18 % — ABNORMAL HIGH (ref 11.5–15.5)
WBC: 3.9 10*3/uL — ABNORMAL LOW (ref 4.0–10.5)

## 2016-01-13 LAB — BASIC METABOLIC PANEL
ANION GAP: 7 (ref 5–15)
BUN: 14 mg/dL (ref 6–20)
CHLORIDE: 111 mmol/L (ref 101–111)
CO2: 20 mmol/L — AB (ref 22–32)
CREATININE: 0.93 mg/dL (ref 0.44–1.00)
Calcium: 7.4 mg/dL — ABNORMAL LOW (ref 8.9–10.3)
GFR calc non Af Amer: 58 mL/min — ABNORMAL LOW (ref >60)
Glucose, Bld: 119 mg/dL — ABNORMAL HIGH (ref 65–99)
POTASSIUM: 4.2 mmol/L (ref 3.5–5.1)
SODIUM: 138 mmol/L (ref 135–145)

## 2016-01-13 LAB — TROPONIN I
TROPONIN I: 0.07 ng/mL — AB (ref <0.03)
Troponin I: 0.06 ng/mL (ref <0.03)

## 2016-01-13 LAB — HEMOGLOBIN AND HEMATOCRIT, BLOOD
HEMATOCRIT: 22.9 % — AB (ref 36.0–46.0)
Hemoglobin: 7.3 g/dL — ABNORMAL LOW (ref 12.0–15.0)

## 2016-01-13 LAB — PREPARE RBC (CROSSMATCH)

## 2016-01-13 MED ORDER — SODIUM CHLORIDE 0.9 % IV BOLUS (SEPSIS)
2000.0000 mL | Freq: Once | INTRAVENOUS | Status: AC
  Administered 2016-01-13: 2000 mL via INTRAVENOUS

## 2016-01-13 MED ORDER — LIP MEDEX EX OINT
TOPICAL_OINTMENT | CUTANEOUS | Status: AC
  Administered 2016-01-13: 12:00:00
  Filled 2016-01-13: qty 7

## 2016-01-13 MED ORDER — HYDROCODONE-ACETAMINOPHEN 5-325 MG PO TABS: 1.0000 | ORAL_TABLET | Freq: Four times a day (QID) | ORAL | 0 refills | Status: DC | PRN

## 2016-01-13 MED ORDER — ADULT MULTIVITAMIN W/MINERALS CH
1.0000 | ORAL_TABLET | Freq: Every day | ORAL | Status: DC
  Administered 2016-01-13 – 2016-01-15 (×3): 1 via ORAL
  Filled 2016-01-13 (×3): qty 1

## 2016-01-13 MED ORDER — SODIUM CHLORIDE 0.9 % IV SOLN
Freq: Once | INTRAVENOUS | Status: AC
  Administered 2016-01-13: 03:00:00 via INTRAVENOUS

## 2016-01-13 MED ORDER — ASPIRIN 325 MG PO TBEC: 325.0000 mg | DELAYED_RELEASE_TABLET | Freq: Two times a day (BID) | ORAL | 0 refills | Status: DC

## 2016-01-13 NOTE — Progress Notes (Signed)
Initial Nutrition Assessment  DOCUMENTATION CODES:   Not applicable  INTERVENTION:  - Will order daily multivitamin with minerals.  - Advance diet as medically feasible. - RD will continue to monitor for additional nutrition-related needs.  NUTRITION DIAGNOSIS:   Inadequate protein intake related to other (see comment) (current diet order) as evidenced by other (see comment) (CLD does not meet estimated protein need).  GOAL:   Patient will meet greater than or equal to 90% of their needs  MONITOR:   PO intake, Diet advancement, Weight trends, Labs, Skin, I & O's  REASON FOR ASSESSMENT:   Malnutrition Screening Tool, Consult Hip fracture protocol  ASSESSMENT:   76 y.o. female with a past medical history significant for hyperthyroidism on methimazole, COPD not on home O2 and chronic pain who presents with hip pain after a fall. The patient was in her normal state of health until this evening when she was getting some milk from the fridge and was unsteady on her feet and fell and had immediate RIGHT hip pain and could not walk.    Pt seen for MST and consult. BMI indicates normal weight. No intakes documented. Pt reports she had chicken broth, cranberry juice, jello, and tea for breakfast. Tech was ordering lunch tray when RD entered pt's room. She denies abdominal pain or nausea with intakes. She states that PTA she had a good appetite. She was previously at a nursing home following hospitalization at [facility] but has been home for 3-4 months, per her report. She states that while at NH she was depressed and did not eat well but that since being home her appetite has returned.   Pt reports that she has lost 10 lbs and that weight loss began during the time when she was in NH. Per chart review, pt lost 14 lbs (10% body weight) from 11/23/15-11/30/15 which is significant. From 11/30/15-12/31/15 weight was stable (121-124 lbs). From 12/31/15-01/12/16 she gained 10 lbs. CBW is now consistent  with weight from 11/20/15 and 11/23/15. Will continue to monitor weight closely based on this. Noted pt has hx of hyperthyroidism and is on medication for this.   Physical assessment shows no muscle and no fat wasting. Mild edema to BLE which may be result of surgery yesterday.  She states that she has chronic, intermittent R clavicle pain after she broke it in the past. Pt reports she has had PT for this. She is R handed but denies pain limiting her ability to eat.  Medications reviewed; PRN Dulcolax, 100 mg Colace BID, 325 mg hemocyte BID, PRN Imodium, PRN Reglan, PRN Zofran, PRN Miralax. Labs reviewed; Ca: 7.4 mg/dL.   IVF: LR @ 75 mL/hr.    Diet Order:  Diet clear liquid Room service appropriate? Yes; Fluid consistency: Thin  Skin:  Wound (see comment) (R hip incision from 01/12/16)  Last BM:  1/3  Height:   Ht Readings from Last 1 Encounters:  01/12/16 '5\' 8"'$  (1.727 m)    Weight:   Wt Readings from Last 1 Encounters:  01/12/16 132 lb 11.5 oz (60.2 kg)    Ideal Body Weight:  63.64 kg  BMI:  Body mass index is 20.18 kg/m.  Estimated Nutritional Needs:   Kcal:  1325-1565 (22-26 kcal/kg)  Protein:  50-60 grams  Fluid:  1.5-1.7 L/day  EDUCATION NEEDS:   No education needs identified at this time    Dawn Matin, MS, RD, LDN, CNSC Inpatient Clinical Dietitian Pager # [phone] After hours/weekend pager # [phone]

## 2016-01-13 NOTE — Progress Notes (Signed)
     Subjective: 1 Day Post-Op Procedure(s) (LRB): ARTHROPLASTY BIPOLAR HIP (HEMIARTHROPLASTY) (Right)   Patient reports pain as mild, pain controlled.  HR appears to back t in the normal range.  BP however still appears to be soft.  She is currently receiving her 2nd L of fluid.  States that she is happy that the surgery went well.  Objective:   VITALS:   Vitals:   01/13/16 0600 01/13/16 0800  BP: (!) 90/51 (!) 81/49  Pulse: 81 (!) 120  Resp: 17 (!) 28  Temp:  97.7 F (36.5 C)    Dorsiflexion/Plantar flexion intact Incision: dressing C/[name]/I No cellulitis present Compartment soft No significant swelling about the right hip  LABS  Recent Labs  01/11/16 2145 01/12/16 0423 01/13/16 0014 01/13/16 0718  HGB 9.6* 9.0* 7.3* 8.8*  HCT 30.5* 28.0* 22.9* 27.5*  WBC 10.8* 8.3  --  3.9*  PLT 223 178  --  131*     Recent Labs  01/11/16 2145 01/12/16 0423 01/13/16 0718  NA 135 138 138  K 3.9 4.0 4.2  BUN 22* 19 14  CREATININE 1.43* 1.10* 0.93  GLUCOSE 104* 101* 119*     Assessment/Plan: 1 Day Post-Op Procedure(s) (LRB): ARTHROPLASTY BIPOLAR HIP (HEMIARTHROPLASTY) (Right) Up with therapy when able, BP stable Appreciate medicine caring for the patient  Ortho recommendations:  ASA 325 mg bid for 4 weeks for anticoagulation, unless other medically indicated.  Norco for pain management (Rx written).  MiraLax and Colace for constipation  Iron 325 mg tid for 2-3 weeks   WBAT on the right leg.  Dressing to remain in place until follow in clinic in 2 weeks.  Dressing is waterproof and may shower with it in place.  Follow up in 2 weeks at [facility]. Follow up with [name],Dawn [name] in 2 weeks.  Contact information:  [facility] [address]   PAC  01/13/2016, 9:20 AM

## 2016-01-13 NOTE — Progress Notes (Signed)
PROGRESS NOTE                                                                                                                                                                                                             Patient Demographics:    Dawn [name], is a 76 y.o. female, DOB - [birth year], IOE:[id number]  Admit date - 01/11/2016   Admitting Physician Edwin Dada, MD  Outpatient Primary MD for the patient is [name], MD  LOS - 1  Outpatient Specialists : none  Chief Complaint  Patient presents with  . Fall  . Hip Pain       Brief Narrative   65 female with COPD not on home o2, recently diagnosed hyperthyroidism on methimazole presented with fall with rt femoral neck fracture. Post op went into rapid afib, then hypotensive requiring stepdown admission.     Subjective:   Transferred to stepdown for rapid Afib and hypotensive. Also had drop in h&H. Received 1.5 L NS bolus and 1 u PRBC. BP  81/60 this am. Ordered 2 l NS bolus and BP improved to 110/39mhg. C/o pain in rt hip on movement. denies CP, SOB, palpitations. HR stable off cardizem drip/   Assessment  & Plan :    Principal Problem:   Subcapital fracture of right hip, closed, initial encounter (HLouise Rt hip hemiarthroplasty on 1/2. transferred to stepdown after pt went into rapid afib.  Minimize pain meds D/c foley. Up with therapy 9 start PT later this morning or afternoon once BP stable) Possibly needs SNF DVT prophylaxis per ortho. ( ASA 325 mg BID)   Active Problems:  afib with RVR Post op.  New onset. Possibly triggered by hypotension and postoperative blood loss. Given cardizem drip briefly. Now stable off it and in sinus. On scheduled low dose metoprolol ( hold given low BP) recent 2d Echo with normal EF and no wall motion abnormality.. Now in sinus, if reoccurs, will need cards consult and decide on anticoagulation. (  CHADS2vasc of 3 : age & female sex)  Hypotension SBP in 80s last night. Given 1.5 L NS bolus , then maintenance at 100cc/ hr. This morning BP is 80s over 60s. ordered 2 L NS bolus, improved to 110/70. Continue maintenance NS '@100'$  cc/ hr after bolus completed.     Anemia, iron deficiency/ post  operative blood loss  drop in h&H. Received 1u PRBC. Hb improved to 8.8 post transfusion. continue iron supplement    Chronic pain syndrome Minimize narcotics given low BP    COPD (chronic obstructive pulmonary disease) (HCC) Stable. Currently requiring 2L via [place]. Home meds.    CKD (chronic kidney disease) stage 3, GFR 30-59 ml/min Stable    Hyperthyroidism On methimazole. Recent thyroid fn stable. Rapid afib unlikely from underlying graves'.    Protein-calorie malnutrition, severe Nutrition consult  UTI  on empiric cipro. Multiple species on cx    Code Status : full code  Family Communication  : none at bedside  Disposition Plan  : SNF possibly on 1/5  Barriers For Discharge : active symptoms ( hypotension, afib, stepdown monitoring)  Consults  :  Dr Alvan Dame  Procedures  : rt hip hemiarthroplasty  DVT Prophylaxis  :  ASA  Lab Results  Component Value Date   PLT 131 (L) 01/13/2016    Antibiotics  :   Anti-infectives    Start     Dose/Rate Route Frequency Ordered Stop   01/12/16 2000  ceFAZolin (ANCEF) IVPB 2g/100 mL premix     2 g 200 mL/hr over 30 Minutes Intravenous Every 6 hours 01/12/16 1820 01/13/16 0512   01/12/16 2000  ciprofloxacin (CIPRO) tablet 500 mg     500 mg Oral 2 times daily 01/12/16 1533     01/12/16 1400  ciprofloxacin (CIPRO) IVPB 400 mg     400 mg 200 mL/hr over 60 Minutes Intravenous  Once 01/12/16 1354 01/12/16 1448   01/12/16 1400  ceFAZolin (ANCEF) IVPB 2g/100 mL premix     2 g 200 mL/hr over 30 Minutes Intravenous  Once 01/12/16 1354 01/12/16 1440   01/12/16 1300  cefTRIAXone (ROCEPHIN) 1 g in dextrose 5 % 50 mL IVPB  Status:  Discontinued      1 g 100 mL/hr over 30 Minutes Intravenous Every 24 hours 01/12/16 1220 01/12/16 1354        Objective:   Vitals:   01/13/16 0510 01/13/16 0521 01/13/16 0600 01/13/16 0800  BP: (!) 89/61  (!) 90/51   Pulse: 68 67 81   Resp: '19 19 17   '$ Temp:  97.6 F (36.4 C)  97.7 F (36.5 C)  TempSrc:  Oral  Oral  SpO2: 94% 93% 94%   Weight:      Height:        Wt Readings from Last 3 Encounters:  01/12/16 60.2 kg (132 lb 11.5 oz)  12/31/15 55.3 kg (122 lb)  12/24/15 56.6 kg (124 lb 12.8 oz)     Intake/Output Summary (Last 24 hours) at 01/13/16 0816 Last data filed at 01/13/16 0600  Gross per 24 hour  Intake             5524 ml  Output             1130 ml  Net             4394 ml     Physical Exam  Gen: not in distress, fatigued HEENT:  pallor+, moist mucosa, supple neck Chest: clear b/l, no added sounds CVS: N S1&S2, no murmurs, rubs or gallop GI: soft, NT, ND, BS+ Musculoskeletal: warm, no edema, clean dressing over rt hip    Data Review:    CBC  Recent Labs Lab 01/11/16 2145 01/12/16 0423 01/13/16 0014 01/13/16 0718  WBC 10.8* 8.3  --  3.9*  HGB 9.6* 9.0* 7.3* 8.8*  HCT 30.5* 28.0* 22.9* 27.5*  PLT 223 178  --  131*  MCV 93.3 95.6  --  93.2  MCH 29.4 30.7  --  29.8  MCHC 31.5 32.1  --  32.0  RDW 17.2* 17.6*  --  18.0*  LYMPHSABS 0.6*  --   --   --   MONOABS 0.8  --   --   --   EOSABS 0.1  --   --   --   BASOSABS 0.0  --   --   --     Chemistries   Recent Labs Lab 01/11/16 2145 01/12/16 0423 01/13/16 0718  NA 135 138 138  K 3.9 4.0 4.2  CL 106 109 111  CO2 24 24 20*  GLUCOSE 104* 101* 119*  BUN 22* 19 14  CREATININE 1.43* 1.10* 0.93  CALCIUM 7.9* 7.5* 7.4*   ------------------------------------------------------------------------------------------------------------------ No results for input(s): CHOL, HDL, LDLCALC, TRIG, CHOLHDL, LDLDIRECT in the last 72 hours.  Lab Results  Component Value Date   HGBA1C 5.2 12/01/2015    ------------------------------------------------------------------------------------------------------------------ No results for input(s): TSH, T4TOTAL, T3FREE, THYROIDAB in the last 72 hours.  Invalid input(s): FREET3 ------------------------------------------------------------------------------------------------------------------ No results for input(s): VITAMINB12, FOLATE, FERRITIN, TIBC, IRON, RETICCTPCT in the last 72 hours.  Coagulation profile  Recent Labs Lab 01/11/16 2145  INR 1.21    No results for input(s): DDIMER in the last 72 hours.  Cardiac Enzymes  Recent Labs Lab 01/12/16 1916 01/13/16 0014 01/13/16 0718  TROPONINI 0.10* 0.07* 0.06*   ------------------------------------------------------------------------------------------------------------------ No results found for: BNP  Inpatient Medications  Scheduled Meds: . aspirin EC  325 mg Oral BID  . buPROPion  150 mg Oral Daily  . ciprofloxacin  500 mg Oral BID  . docusate sodium  100 mg Oral BID  . Ferrous Fumarate  106 mg of iron Oral BID PC  . gabapentin  600 mg Oral TID  . Influenza vac split quadrivalent PF  0.5 mL Intramuscular Tomorrow-1000  . methimazole  10 mg Oral BID  . metoprolol tartrate  12.5 mg Oral BID  . sodium chloride  2,000 mL Intravenous Once  . umeclidinium bromide  1 puff Inhalation Daily   Continuous Infusions: . sodium chloride 100 mL/hr at 01/12/16 1822  . diltiazem (CARDIZEM) infusion    . lactated ringers     PRN Meds:.bisacodyl, HYDROcodone-acetaminophen, HYDROmorphone (DILAUDID) injection, loperamide, menthol-cetylpyridinium **OR** phenol, methocarbamol **OR** methocarbamol (ROBAXIN)  IV, metoCLOPramide **OR** metoCLOPramide (REGLAN) injection, ondansetron **OR** ondansetron (ZOFRAN) IV, polyethylene glycol  Micro Results Recent Results (from the past 240 hour(s))  Surgical PCR screen     Status: None   Collection Time: 01/12/16  4:55 AM  Result Value Ref Range  Status   MRSA, PCR NEGATIVE NEGATIVE Final   Staphylococcus aureus NEGATIVE NEGATIVE Final    Comment:        The Xpert SA Assay (FDA approved for NASAL specimens in patients over 19 years of age), is one component of a comprehensive surveillance program.  Test performance has been validated by [facility] for patients greater than or equal to 5 year old. It is not intended to diagnose infection nor to guide or monitor treatment.     Radiology Reports Dg Chest 1 View  Result Date: 01/11/2016 CLINICAL DATA:  Right hip pain status post fall EXAM: CHEST 1 VIEW COMPARISON:  12/03/2015 FINDINGS: Lungs are hyperinflated with emphysematous changes. Probable mild fibrosis within the bilateral lung bases. No acute consolidation or effusion. Stable cardiomegaly. Atherosclerosis. No pneumothorax. Old right  clavicle fracture. Multiple old right rib fractures. IMPRESSION: 1. Hyperinflation with emphysematous changes.  No acute infiltrate. 2. Stable cardiomegaly. Electronically Signed   By: Donavan Foil M.D.   On: 01/11/2016 22:09   Pelvis Portable  Result Date: 01/12/2016 CLINICAL DATA:  76 y/o  F; status post right hip hemiarthroplasty. EXAM: PORTABLE PELVIS 1-2 VIEWS COMPARISON:  None. FINDINGS: There is a right hip hemiarthroplasty. On the single frontal view there is no apparent hardware related complication and the femoral head component is well seated in the acetabulum. No acute fracture identified. Foley catheter. Postsurgical changes in soft tissues overlying the right hip. IMPRESSION: Right hip hemiarthroplasty with expected postsurgical changes and without apparent hardware related complication. Electronically Signed   By: [name] M.D.   On: 01/12/2016 16:18   Dg Chest Port 1 View  Result Date: 01/12/2016 CLINICAL DATA:  Initial evaluation for acute hypoxia. History of right hip surgery today. History of COPD. EXAM: PORTABLE CHEST 1 VIEW COMPARISON:  Prior radiograph from  01/11/2016. FINDINGS: Cardiomegaly is stable from prior. Mediastinal silhouette within normal limits. Aortic atherosclerosis noted. Lungs are hyperinflated with underlying changes compatible with COPD. There is diffusely increased interstitial prominence with a few scattered Kerley B-lines, suggesting pulmonary interstitial edema. A small left pleural effusion is present. No focal infiltrates identified. No pneumothorax. No acute osseous abnormality.  Osteopenia noted. IMPRESSION: 1. Stable cardiomegaly with findings suggestive of mild diffuse pulmonary interstitial edema. 2. Small left pleural effusion. 3. Underlying COPD. 4. Aortic atherosclerosis. Electronically Signed   By: Jeannine Boga M.D.   On: 01/12/2016 22:26   Dg Hip Unilat W Or Wo Pelvis 2-3 Views Right  Result Date: 01/11/2016 CLINICAL DATA:  [place] [address] at home while walking from continue bedroom. EXAM: DG HIP (WITH OR WITHOUT PELVIS) 2-3V RIGHT COMPARISON:  None FINDINGS: There is an acute subcapital right hip fracture with mild varus angulation. No dislocation. No radiographic findings to suggest a pathologic basis for the fracture. IMPRESSION: Subcapital right hip fracture Electronically Signed   By: Andreas Newport M.D.   On: 01/11/2016 22:08    Time Spent in minutes  35   Louellen Molder M.D on 01/13/2016 at 8:16 AM  Between 7am to 7pm - Pager - [phone]  After 7pm go to [email or web address] - password [facility]  [facility] -  Office  [phone]

## 2016-01-13 NOTE — Progress Notes (Signed)
PT Cancellation Note  Patient Details Name: Dawn [name] MRN: [id number] DOB: September 09, 1940   Cancelled Treatment:    Reason Eval/Treat Not Completed: Medical issues which prohibited therapy. Will check back another day.    Weston Anna, MPT Pager: [phone]

## 2016-01-13 NOTE — Discharge Instructions (Signed)

## 2016-01-13 NOTE — Progress Notes (Signed)
OT Cancellation Note  Patient Details Name: Dawn [name] MRN: [id number] DOB: August 19, 1940   Cancelled Treatment:    Reason Eval/Treat Not Completed: Medical issues which prohibited therapy.  BP too low. Will check back tomorrow.  Ranulfo Kall 01/13/2016, 2:25 PM  Lesle Chris, OTR/L [phone] 01/13/2016

## 2016-01-13 NOTE — Progress Notes (Signed)
Pt. transferred from 4 E around 8 pm. Irregular HR (70-130's). BP has been soft ,ranging between 47-42 systolic with diastolic in the 59'D . Mid-level notified. 500 ml NS bolus given. BP continued to be soft. Additional 1000 ml bolus of NS given. Not much improvement in BP.Patient alert and oriented , good urine output. Hgb =7.3.  1 unit of RBC transfusion started at 2:38am.

## 2016-01-14 ENCOUNTER — Inpatient Hospital Stay (HOSPITAL_COMMUNITY): Payer: Medicare Other

## 2016-01-14 DIAGNOSIS — I509 Heart failure, unspecified: Secondary | ICD-10-CM

## 2016-01-14 LAB — BASIC METABOLIC PANEL
ANION GAP: 4 — AB (ref 5–15)
BUN: 15 mg/dL (ref 6–20)
CHLORIDE: 114 mmol/L — AB (ref 101–111)
CO2: 21 mmol/L — AB (ref 22–32)
Calcium: 7.7 mg/dL — ABNORMAL LOW (ref 8.9–10.3)
Creatinine, Ser: 0.99 mg/dL (ref 0.44–1.00)
GFR calc non Af Amer: 54 mL/min — ABNORMAL LOW (ref >60)
Glucose, Bld: 93 mg/dL (ref 65–99)
POTASSIUM: 4.5 mmol/L (ref 3.5–5.1)
Sodium: 139 mmol/L (ref 135–145)

## 2016-01-14 LAB — ECHOCARDIOGRAM COMPLETE
Height: 68 in
Weight: 2123.47 oz

## 2016-01-14 LAB — CBC
HEMATOCRIT: 27.2 % — AB (ref 36.0–46.0)
HEMOGLOBIN: 8.7 g/dL — AB (ref 12.0–15.0)
MCH: 30.1 pg (ref 26.0–34.0)
MCHC: 32 g/dL (ref 30.0–36.0)
MCV: 94.1 fL (ref 78.0–100.0)
Platelets: 151 10*3/uL (ref 150–400)
RBC: 2.89 MIL/uL — ABNORMAL LOW (ref 3.87–5.11)
RDW: 18.5 % — ABNORMAL HIGH (ref 11.5–15.5)
WBC: 5.6 10*3/uL (ref 4.0–10.5)

## 2016-01-14 LAB — URINE CULTURE: CULTURE: NO GROWTH

## 2016-01-14 MED ORDER — ACETAMINOPHEN 325 MG PO TABS
650.0000 mg | ORAL_TABLET | Freq: Once | ORAL | Status: AC
  Administered 2016-01-14: 650 mg via ORAL
  Filled 2016-01-14: qty 2

## 2016-01-14 MED ORDER — SODIUM CHLORIDE 0.9 % IV SOLN: INTRAVENOUS | Status: AC

## 2016-01-14 MED ORDER — MUSCLE RUB 10-15 % EX CREA
TOPICAL_CREAM | CUTANEOUS | Status: DC | PRN
  Filled 2016-01-14: qty 85

## 2016-01-14 MED ORDER — ALPRAZOLAM 0.5 MG PO TABS
0.5000 mg | ORAL_TABLET | Freq: Once | ORAL | Status: AC
  Administered 2016-01-14: 0.5 mg via ORAL
  Filled 2016-01-14: qty 1

## 2016-01-14 NOTE — Evaluation (Signed)
Occupational Therapy Evaluation Patient Details Name: SHALAN NEAULT MRN: [id number] DOB: Jan 01, 1941 Today's Date: 01/14/2016    History of Present Illness 76 yo female admitted after sustaining a fall at home. S/P R hip hemi 01/12/15. hx of COPD, chronic pain, spinal stenosis   Clinical Impression   Pt was admitted for the above. She will benefit from continued OT in acute setting and post acute to increase safety and independence with adls following THPs.  Pt currently needs up to total A for LB adls and +2 needed for lines/safety.  Goals are for min A to min guard assist in acute    Follow Up Recommendations  SNF    Equipment Recommendations  3 in 1 bedside commode    Recommendations for Other Services       Precautions / Restrictions Precautions Precautions: Fall;Posterior Hip Restrictions Weight Bearing Restrictions: No RLE Weight Bearing: Weight bearing as tolerated Other Position/Activity Restrictions: WBAT      Mobility Bed Mobility Overal bed mobility: Needs Assistance Bed Mobility: Supine to Sit     Supine to sit: Mod assist;+2 for physical assistance     General bed mobility comments: assist for trunk and legs  Transfers Overall transfer level: Needs assistance Equipment used: Rolling walker (2 wheeled) Transfers: Sit to/from [employer] Transfers Sit to Stand: Min assist;+2 safety/equipment;From elevated surface Stand pivot transfers: Min assist;+2 safety/equipment       General transfer comment: assist to rise and steady and cues for sequence, safety and THPs    Balance Overall balance assessment: History of Falls                                          ADL Overall ADL's : Needs assistance/impaired     Grooming: Set up;Sitting   Upper Body Bathing: Minimal assistance   Lower Body Bathing: Maximal assistance;Sit to/from stand;+2 for safety/equipment   Upper Body Dressing : Minimal assistance;Sitting   Lower  Body Dressing: Total assistance;Sit to/from stand;+2 for safety/equipment   Toilet Transfer: Minimal assistance;Stand-pivot;+2 for safety/equipment;RW (to chair)             General ADL Comments: reviewed posterior THPs; pt feels bad today and will need reinforcement with them.  Needed encouragement:  kept saying, "I don't know if I can do that"     Vision     Perception     Praxis      Pertinent Vitals/Pain Pain Assessment: Faces Faces Pain Scale: Hurts even more Pain Location: R hip Pain Descriptors / Indicators: Aching Pain Intervention(s): Limited activity within patient's tolerance;Monitored during session;Premedicated before session;Repositioned     Hand Dominance Right   Extremity/Trunk Assessment Upper Extremity Assessment Upper Extremity Assessment: Generalized weakness (grossly 3 to 3+/5; only lifts UEs to 90)           Communication Communication Communication: No difficulties   Cognition Arousal/Alertness: Awake/alert Behavior During Therapy: WFL for tasks assessed/performed Overall Cognitive Status: Within Functional Limits for tasks assessed                     General Comments       Exercises       Shoulder Instructions      Home Living Family/patient expects to be discharged to:: Skilled nursing facility  Prior Functioning/Environment Level of Independence: Independent                 OT Problem List: Decreased strength;Decreased activity tolerance;Decreased knowledge of use of DME or AE;Pain;Decreased knowledge of precautions   OT Treatment/Interventions: Self-care/ADL training;DME and/or AE instruction;Patient/family education;Therapeutic activities    OT Goals(Current goals can be found in the care plan section) Acute Rehab OT Goals Patient Stated Goal: none stated OT Goal Formulation: With patient Time For Goal Achievement: 01/28/16 Potential to Achieve Goals:  Good ADL Goals Pt Will Perform Grooming: with min guard assist;standing Pt Will Perform Lower Body Bathing: with min assist;with adaptive equipment;sit to/from stand Pt Will Perform Lower Body Dressing: with adaptive equipment;sit to/from stand;with mod assist Pt Will Transfer to Toilet: stand pivot transfer;bedside commode;with min guard assist Pt Will Perform Toileting - Clothing Manipulation and hygiene: with min guard assist;sit to/from stand Additional ADL Goal #1: pt will recall 3/3 thps  OT Frequency: Min 2X/week   Barriers to D/C:            Co-evaluation PT/OT/SLP Co-Evaluation/Treatment: Yes Reason for Co-Treatment: For patient/therapist safety PT goals addressed during session: Mobility/safety with mobility OT goals addressed during session: ADL's and self-care      End of Session    Activity Tolerance: Patient limited by fatigue;Patient limited by pain Patient left: in chair;with call bell/phone within reach   Time: 1221-1243 OT Time Calculation (min): 22 min Charges:  OT General Charges $OT Visit: 1 Procedure OT Evaluation $OT Eval Low Complexity: 1 Procedure G-Codes:    Tylan Kinn 01/28/16, 1:29 PM  Lesle Chris, OTR/L [phone] 2016-01-28

## 2016-01-14 NOTE — Clinical Social Work Note (Signed)
Clinical Social Work Assessment  Patient Details  Name: Dawn [name] MRN: [id number] Date of Birth: 22-Jun-1940  Date of referral:  01/14/16               Reason for consult:  Discharge Planning                Permission sought to share information with:    Permission granted to share information::     Name::        Agency::     Relationship::     Contact Information:     Housing/Transportation Living arrangements for the past 2 months:  Single Family Home Source of Information:  Patient Patient Interpreter Needed:    Criminal Activity/Legal Involvement Pertinent to Current Situation/Hospitalization:  No - Comment as needed Significant Relationships:  Adult Children Lives with:  Adult Children Do you feel safe going back to the place where you live?   (SNF mostly needed.) Need for family participation in patient care:  Yes (Comment)  Care giving concerns: No concerns reported at this time.   Social Worker assessment / plan:  Pt hospitalized from home where she lives with family on 01/11/16 with a right hip fx. Surgery has been completed. PT eval / recommendations pending. Pt is part of medicare bundle program. CSW met with pt at bedside to assist with d/c planning. Pt will consider ST Rehab placement if recommended. Pt gave CSW permission to contact her daughter to assist with d/c planning. RNCM Sharee Pimple [phone] ) from [place] also following to assist with d/c planning.  CSW will meet again with pt / contact daughter once PT recommendations are available.  Employment status:  Retired Forensic scientist:  Medicare PT Recommendations:    Information / Referral to community resources:  [place]  Patient/Family's Response to care:  To be determined.  Patient/Family's Understanding of and Emotional Response to Diagnosis, Current Treatment, and Prognosis:  Pt is aware of her medical status. She is pleased her surgery is over. Looking forward to feeling  better. Pt is considering d/c options.  Emotional Assessment Appearance:  Appears stated age Attitude/Demeanor/Rapport:  Other (cooperative) Affect (typically observed):  Appropriate, Calm Orientation:  Oriented to Self, Oriented to Place, Oriented to  Time, Oriented to Situation Alcohol / Substance use:  Not Applicable Psych involvement (Current and /or in the community):  No (Comment)  Discharge Needs  Concerns to be addressed:  Discharge Planning Concerns Readmission within the last 30 days:  No Current discharge risk:  None Barriers to Discharge:  No Barriers Identified   Luretha Rued, [place] 01/14/2016, 9:29 AM

## 2016-01-14 NOTE — Progress Notes (Signed)
  Echocardiogram 2D Echocardiogram has been performed.  Bobbye Charleston 01/14/2016, 12:03 PM

## 2016-01-14 NOTE — Progress Notes (Signed)
CSW met with pt / daughter at bedside. SNF bed offers provided. Sharee Pimple [facility] ) contacted for assistance with d/c planning / SNF recommendations. Pt is part of the medicare bundle program. Message left. Awaiting return call. Daughter will tour facilities. Pt has applied for medicaid ( LTC ). Daughter thinks LTC placement may be needed following rehab. CSW will continue to follow to assist with d/c planning.   Werner Lean LCSW [phone]

## 2016-01-14 NOTE — Progress Notes (Addendum)
     Subjective: 2 Days Post-Op Procedure(s) (LRB): ARTHROPLASTY BIPOLAR HIP (HEMIARTHROPLASTY) (Right)   Patient reports pain as mild / none.  States that she is not having any issues with her hip.  States that she feels weak over the rest of her body.  States that she was told that she has an irregular heart beat and will need to be placed on anticoagulation.  Objective:   VITALS:   Vitals:   01/14/16 0800 01/14/16 0900  BP: 125/72 112/63  Pulse: 83 (!) 116  Resp: 20   Temp:      Dorsiflexion/Plantar flexion intact Incision: dressing C/D/I No cellulitis present Compartment soft  LABS  Recent Labs  01/12/16 0423 01/13/16 0014 01/13/16 0718 01/14/16 0337  HGB 9.0* 7.3* 8.8* 8.7*  HCT 28.0* 22.9* 27.5* 27.2*  WBC 8.3  --  3.9* 5.6  PLT 178  --  131* 151     Recent Labs  01/12/16 0423 01/13/16 0718 01/14/16 0337  NA 138 138 139  K 4.0 4.2 4.5  BUN '19 14 15  '$ CREATININE 1.10* 0.93 0.99  GLUCOSE 101* 119* 93     Assessment/Plan: 2 Days Post-Op Procedure(s) (LRB): ARTHROPLASTY BIPOLAR HIP (HEMIARTHROPLASTY) (Right) Up with therapy when able Discharge disposition to be determined Anticoagulation per medicine as need for irregular cardio  Ortho recommendations:  ASA 325 mg bid for 4 weeks for anticoagulation, unless other medically indicated.  Norco for pain management (Rx written).  MiraLax and Colace for constipation  Iron 325 mg tid for 2-3 weeks   WBAT on the right leg.  Dressing to remain in place until follow in clinic in 2 weeks.  Dressing is waterproof and may shower with it in place.  Follow up in 2 weeks at [facility]. Follow up with [name],Ketrina Boateng D in 2 weeks.  Contact information:  [facility] [address], [address] [place] Camari Wisham   PAC  01/14/2016, 9:53 AM

## 2016-01-14 NOTE — Evaluation (Signed)
Physical Therapy Evaluation Patient Details Name: Dawn [name] MRN: [id number] DOB: 11/21/1940 Today's Date: 01/14/2016   History of Present Illness  76 yo female admitted after sustaining a fall at home. S/P R hip hemi 01/12/15. hx of COPD, chronic pain, spinal stenosis  Clinical Impression  On eval, pt required Mod assist +2 for mobility. Pt presents with general weakness, decreased activity tolerance, and impaired gait and balance. Remained on [place] O2 during session. Will follow and progress activity as tolerated. Recommending ST rehab at SNF at this time.     Follow Up Recommendations SNF    Equipment Recommendations  None recommended by PT    Recommendations for Other Services OT consult     Precautions / Restrictions Precautions Precautions: Fall;Posterior Hip Restrictions Weight Bearing Restrictions: No RLE Weight Bearing: Weight bearing as tolerated Other Position/Activity Restrictions: WBAT      Mobility  Bed Mobility Overal bed mobility: Needs Assistance Bed Mobility: Supine to Sit     Supine to sit: Mod assist;+2 for safety/equipment;HOB elevated     General bed mobility comments: assist for trunk and legs. utilized bedpad for scooting, positioning.   Transfers Overall transfer level: Needs assistance Equipment used: Rolling walker (2 wheeled) Transfers: Sit to/from Stand Sit to Stand: Min assist;+2 physical assistance;+2 safety/equipment;From elevated surface Stand pivot transfers: Min assist;+2 physical assistance;+2 safety/equipment;From elevated surface       General transfer comment: assist to rise and steady and cues for sequence, safety and THPs. Stand pivot, bed to recliner, with RW.   Ambulation/Gait                Stairs            Wheelchair Mobility    Modified Rankin (Stroke Patients Only)       Balance Overall balance assessment: History of Falls                                            Pertinent Vitals/Pain Pain Assessment: Faces Faces Pain Scale: Hurts even more Pain Location: R hip Pain Descriptors / Indicators: Aching;Sore Pain Intervention(s): Limited activity within patient's tolerance;Monitored during session;Repositioned;Premedicated before session    Home Living Family/patient expects to be discharged to:: Skilled nursing facility                      Prior Function Level of Independence: Independent               Hand Dominance   Dominant Hand: Right    Extremity/Trunk Assessment   Upper Extremity Assessment Upper Extremity Assessment: Defer to OT evaluation    Lower Extremity Assessment Lower Extremity Assessment: Generalized weakness    Cervical / Trunk Assessment Cervical / Trunk Assessment: Normal  Communication   Communication: No difficulties  Cognition Arousal/Alertness: Awake/alert Behavior During Therapy: WFL for tasks assessed/performed Overall Cognitive Status: Within Functional Limits for tasks assessed                      General Comments      Exercises     Assessment/Plan    PT Assessment Patient needs continued PT services  PT Problem List Decreased strength;Decreased mobility;Decreased activity tolerance;Decreased balance;Decreased knowledge of use of DME;Pain;Cardiopulmonary status limiting activity          PT Treatment Interventions DME instruction;Therapeutic activities;Gait training;Therapeutic exercise;Patient/family education;Functional mobility training;Balance training  PT Goals (Current goals can be found in the Care Plan section)  Acute Rehab PT Goals Patient Stated Goal: less pain.  PT Goal Formulation: With patient Time For Goal Achievement: 01/28/16 Potential to Achieve Goals: Good    Frequency Min 3X/week   Barriers to discharge        Co-evaluation   Reason for Co-Treatment: For patient/therapist safety PT goals addressed during session: Mobility/safety with  mobility OT goals addressed during session: ADL's and self-care       End of Session Equipment Utilized During Treatment: Gait belt Activity Tolerance: Patient limited by fatigue;Patient limited by pain Patient left: in chair;with call bell/phone within reach           Time: 1221-1242 PT Time Calculation (min) (ACUTE ONLY): 21 min   Charges:   PT Evaluation $PT Eval Low Complexity: 1 Procedure     PT G Codes:        Weston Anna, MPT Pager: [phone]

## 2016-01-14 NOTE — Progress Notes (Signed)
PROGRESS NOTE                                                                                                                                                                                                             Patient Demographics:    Dawn [name], is a 76 y.o. female, DOB - Jun 15, 1940, WUX:[id number]  Admit date - 01/11/2016   Admitting Physician Edwin Dada, MD  Outpatient Primary MD for the patient is [name], MD  LOS - 2  Outpatient Specialists : none  Chief Complaint  Patient presents with  . Fall  . Hip Pain       Brief Narrative   62 female with COPD not on home o2, recently diagnosed hyperthyroidism on methimazole presented with fall with rt femoral neck fracture. Post op had tachycardia/hypotension, transferred to SDU     Subjective:   Feels ok, has some diarrhea, breathing ok   Assessment  & Plan :    Principal Problem:   Subcapital fracture of right hip, closed, initial encounter ([place]) -Rt hip hemiarthroplasty on 1/2  -post op anemia/hypotension -PT consulting, will need SNF -Ortho started on AS '325mg'$  BID for DVT prophylaxis  Hypotension/ Premature/ectopic atrial tachycardia -prior h/o same -Possibly triggered by hypotension and postoperative blood loss. Given cardizem drip briefly. Now stable off it and in sinus. On scheduled low dose metoprolol ( held given low BP) -recent 2d Echo with normal EF and no wall motion abnormality. -Afib documented by prior MD but I do not see any evidence of AFib -Tele reviewed and CV stripes, asked RN to monitor for Afib  -cut down IVF -in NSR now    Anemia, iron deficiency/ post operative blood loss -Received 1u PRBC. Hb improved to 8.8 post transfusion.  -monitor Hb    Chronic pain syndrome -Minimize narcotics given low BP    COPD (chronic obstructive pulmonary disease) (HCC) -Stable. Currently requiring 2L via [place]. Home  meds.    CKD (chronic kidney disease) stage 3, GFR 30-59 ml/min -Stable    Hyperthyroidism -On methimazole. Recent thyroid funtion stable.     Protein-calorie malnutrition, severe Nutrition consult  UTI -stopped cipro/urine cx negative  Code Status : full code Family Communication  : none at bedside Disposition Plan  : SNF possibly on 1/5  Consults  :  Dr Alvan Dame  Procedures  :  rt hip hemiarthroplasty  DVT Prophylaxis  :  ASA BID per Ortho  Lab Results  Component Value Date   PLT 151 01/14/2016    Antibiotics  :   Anti-infectives    Start     Dose/Rate Route Frequency Ordered Stop   01/12/16 2000  ceFAZolin (ANCEF) IVPB 2g/100 mL premix     2 g 200 mL/hr over 30 Minutes Intravenous Every 6 hours 01/12/16 1820 01/13/16 0512   01/12/16 2000  ciprofloxacin (CIPRO) tablet 500 mg     500 mg Oral 2 times daily 01/12/16 1533     01/12/16 1400  ciprofloxacin (CIPRO) IVPB 400 mg     400 mg 200 mL/hr over 60 Minutes Intravenous  Once 01/12/16 1354 01/12/16 1448   01/12/16 1400  ceFAZolin (ANCEF) IVPB 2g/100 mL premix     2 g 200 mL/hr over 30 Minutes Intravenous  Once 01/12/16 1354 01/12/16 1440   01/12/16 1300  cefTRIAXone (ROCEPHIN) 1 g in dextrose 5 % 50 mL IVPB  Status:  Discontinued     1 g 100 mL/hr over 30 Minutes Intravenous Every 24 hours 01/12/16 1220 01/12/16 1354        Objective:   Vitals:   01/14/16 0900 01/14/16 1000 01/14/16 1100 01/14/16 1200  BP: 112/63 113/65 103/66 124/75  Pulse: (!) 116 79 76 70  Resp:  '20 19 18  '$ Temp:      TempSrc:      SpO2: 97% 97% 97% 92%  Weight:      Height:        Wt Readings from Last 3 Encounters:  01/12/16 60.2 kg (132 lb 11.5 oz)  12/31/15 55.3 kg (122 lb)  12/24/15 56.6 kg (124 lb 12.8 oz)     Intake/Output Summary (Last 24 hours) at 01/14/16 1255 Last data filed at 01/14/16 1100  Gross per 24 hour  Intake           2032.5 ml  Output              950 ml  Net           1082.5 ml     Physical  Exam  Gen: AAOx3, chronically ill appearing HEENT:  pallor+, moist mucosa, supple neck Chest: clear b/l, no added sounds CVS:  S1S2/RRR, no murmurs, rubs or gallop GI: soft, NT, mildly distended, BS+ Musculoskeletal: warm, no edema, clean dressing over rt hip    Data Review:    CBC  Recent Labs Lab 01/11/16 2145 01/12/16 0423 01/13/16 0014 01/13/16 0718 01/14/16 0337  WBC 10.8* 8.3  --  3.9* 5.6  HGB 9.6* 9.0* 7.3* 8.8* 8.7*  HCT 30.5* 28.0* 22.9* 27.5* 27.2*  PLT 223 178  --  131* 151  MCV 93.3 95.6  --  93.2 94.1  MCH 29.4 30.7  --  29.8 30.1  MCHC 31.5 32.1  --  32.0 32.0  RDW 17.2* 17.6*  --  18.0* 18.5*  LYMPHSABS 0.6*  --   --   --   --   MONOABS 0.8  --   --   --   --   EOSABS 0.1  --   --   --   --   BASOSABS 0.0  --   --   --   --     Chemistries   Recent Labs Lab 01/11/16 2145 01/12/16 0423 01/13/16 0718 01/14/16 0337  NA 135 138 138 139  K 3.9 4.0 4.2 4.5  CL 106 109 111  114*  CO2 24 24 20* 21*  GLUCOSE 104* 101* 119* 93  BUN 22* '19 14 15  '$ CREATININE 1.43* 1.10* 0.93 0.99  CALCIUM 7.9* 7.5* 7.4* 7.7*   ------------------------------------------------------------------------------------------------------------------ No results for input(s): CHOL, HDL, LDLCALC, TRIG, CHOLHDL, LDLDIRECT in the last 72 hours.  Lab Results  Component Value Date   HGBA1C 5.2 12/01/2015   ------------------------------------------------------------------------------------------------------------------ No results for input(s): TSH, T4TOTAL, T3FREE, THYROIDAB in the last 72 hours.  Invalid input(s): FREET3 ------------------------------------------------------------------------------------------------------------------ No results for input(s): VITAMINB12, FOLATE, FERRITIN, TIBC, IRON, RETICCTPCT in the last 72 hours.  Coagulation profile  Recent Labs Lab 01/11/16 2145  INR 1.21    No results for input(s): DDIMER in the last 72 hours.  Cardiac  Enzymes  Recent Labs Lab 01/12/16 1916 01/13/16 0014 01/13/16 0718  TROPONINI 0.10* 0.07* 0.06*   ------------------------------------------------------------------------------------------------------------------ No results found for: BNP  Inpatient Medications  Scheduled Meds: . aspirin EC  325 mg Oral BID  . buPROPion  150 mg Oral Daily  . ciprofloxacin  500 mg Oral BID  . docusate sodium  100 mg Oral BID  . Ferrous Fumarate  106 mg of iron Oral BID PC  . gabapentin  600 mg Oral TID  . methimazole  10 mg Oral BID  . metoprolol tartrate  12.5 mg Oral BID  . multivitamin with minerals  1 tablet Oral Daily  . umeclidinium bromide  1 puff Inhalation Daily   Continuous Infusions: . sodium chloride 50 mL/hr at 01/14/16 1100  . diltiazem (CARDIZEM) infusion     PRN Meds:.bisacodyl, HYDROcodone-acetaminophen, loperamide, menthol-cetylpyridinium **OR** phenol, methocarbamol **OR** methocarbamol (ROBAXIN)  IV, metoCLOPramide **OR** metoCLOPramide (REGLAN) injection, ondansetron **OR** ondansetron (ZOFRAN) IV, polyethylene glycol  Micro Results Recent Results (from the past 240 hour(s))  Surgical PCR screen     Status: None   Collection Time: 01/12/16  4:55 AM  Result Value Ref Range Status   MRSA, PCR NEGATIVE NEGATIVE Final   Staphylococcus aureus NEGATIVE NEGATIVE Final    Comment:        The Xpert SA Assay (FDA approved for NASAL specimens in patients over 66 years of age), is one component of a comprehensive surveillance program.  Test performance has been validated by [facility] for patients greater than or equal to 46 year old. It is not intended to diagnose infection nor to guide or monitor treatment.   Urine culture     Status: None   Collection Time: 01/12/16  6:20 PM  Result Value Ref Range Status   Specimen Description URINE, RANDOM  Final   Special Requests NONE  Final   Culture NO GROWTH Performed at [facility]   Final   Report Status  01/14/2016 FINAL  Final    Radiology Reports Dg Chest 1 View  Result Date: 01/11/2016 CLINICAL DATA:  Right hip pain status post fall EXAM: CHEST 1 VIEW COMPARISON:  12/03/2015 FINDINGS: Lungs are hyperinflated with emphysematous changes. Probable mild fibrosis within the bilateral lung bases. No acute consolidation or effusion. Stable cardiomegaly. Atherosclerosis. No pneumothorax. Old right clavicle fracture. Multiple old right rib fractures. IMPRESSION: 1. Hyperinflation with emphysematous changes.  No acute infiltrate. 2. Stable cardiomegaly. Electronically Signed   By: Donavan Foil M.D.   On: 01/11/2016 22:09   Pelvis Portable  Result Date: 01/12/2016 CLINICAL DATA:  76 y/o  F; status post right hip hemiarthroplasty. EXAM: PORTABLE PELVIS 1-2 VIEWS COMPARISON:  None. FINDINGS: There is a right hip hemiarthroplasty. On the single frontal view there is no apparent  hardware related complication and the femoral head component is well seated in the acetabulum. No acute fracture identified. Foley catheter. Postsurgical changes in soft tissues overlying the right hip. IMPRESSION: Right hip hemiarthroplasty with expected postsurgical changes and without apparent hardware related complication. Electronically Signed   By: [name] M.D.   On: 01/12/2016 16:18   Dg Chest Port 1 View  Result Date: 01/12/2016 CLINICAL DATA:  Initial evaluation for acute hypoxia. History of right hip surgery today. History of COPD. EXAM: PORTABLE CHEST 1 VIEW COMPARISON:  Prior radiograph from 01/11/2016. FINDINGS: Cardiomegaly is stable from prior. Mediastinal silhouette within normal limits. Aortic atherosclerosis noted. Lungs are hyperinflated with underlying changes compatible with COPD. There is diffusely increased interstitial prominence with a few scattered Kerley B-lines, suggesting pulmonary interstitial edema. A small left pleural effusion is present. No focal infiltrates identified. No pneumothorax. No  acute osseous abnormality.  Osteopenia noted. IMPRESSION: 1. Stable cardiomegaly with findings suggestive of mild diffuse pulmonary interstitial edema. 2. Small left pleural effusion. 3. Underlying COPD. 4. Aortic atherosclerosis. Electronically Signed   By: Jeannine Boga M.D.   On: 01/12/2016 22:26   Dg Hip Unilat W Or Wo Pelvis 2-3 Views Right  Result Date: 01/11/2016 CLINICAL DATA:  [address] at home while walking from continue bedroom. EXAM: DG HIP (WITH OR WITHOUT PELVIS) 2-3V RIGHT COMPARISON:  None FINDINGS: There is an acute subcapital right hip fracture with mild varus angulation. No dislocation. No radiographic findings to suggest a pathologic basis for the fracture. IMPRESSION: Subcapital right hip fracture Electronically Signed   By: Andreas Newport M.D.   On: 01/11/2016 22:08    Time Spent in minutes  [place] M.D on 01/14/2016 at 12:55 PM  Between 7am to 7pm - Pager - [phone]  After 7pm go to [email or web address] - password [facility]  [facility] -  Office  [phone]

## 2016-01-14 NOTE — NC FL2 (Signed)
[place] LEVEL OF CARE SCREENING TOOL     IDENTIFICATION  Patient Name: Dawn [name] Birthdate: 1940-08-21 Sex: female Admission Date (Current Location): 01/11/2016  [facility] and [place] Number:  Herbalist and Address:  [facility],  [place] [address], [place]      Provider Number: [id number]  Attending Physician Name and Address:  Domenic Polite, MD  Relative Name and Phone Number:       Current Level of Care: Hospital Recommended Level of Care: [place] Prior Approval Number:    Date Approved/Denied:   PASRR Number: [id number]  Discharge Plan: SNF    Current Diagnoses: Patient Active Problem List   Diagnosis Date Noted  . Subcapital fracture of right hip, closed, initial encounter ([place]) 01/12/2016  . History of depression 12/12/2015  . Alteration consciousness 11/30/2015  . SVT (supraventricular tachycardia) ([place]) 11/27/2015  . Diarrhea 11/27/2015  . Depression 11/27/2015  . Polyneuropathy ([place]) 11/27/2015  . Renal insufficiency   . Protein-calorie malnutrition, severe 11/18/2015  . Intra-abdominal fluid collection   . Premature atrial contractions   . Functional diarrhea   . Sepsis ([place])   . Hyperthyroidism   . Hypotension   . Septic shock ([place])   . Acute encephalopathy   . Hypokalemia   . Acute delirium 11/10/2015  . Acute kidney injury ([place]) 11/10/2015  . Anemia, iron deficiency 11/10/2015  . Anxiety 11/10/2015  . Chronic pain syndrome 11/10/2015  . Recurrent UTI 11/10/2015  . Acute respiratory failure with hypoxia ([place]) 11/10/2015  . COPD (chronic obstructive pulmonary disease) ([place]) 11/10/2015  . Acute hyperglycemia 11/10/2015  . CKD (chronic kidney disease) stage 3, GFR 30-59 ml/min 11/10/2015  . Narrow complex tachycardia ([place]) 11/10/2015  . HTN (hypertension) 11/10/2015  . Generalized weakness 11/10/2015  . Anemia   . Delirium   . Hypoxemia   . Tachycardia      Orientation RESPIRATION BLADDER Height & Weight     Self, Time, Situation, Place  Normal Indwelling catheter Weight: 132 lb 11.5 oz (60.2 kg) Height:  '5\' 8"'$  (172.7 cm)  BEHAVIORAL SYMPTOMS/MOOD NEUROLOGICAL BOWEL NUTRITION STATUS  Other (Comment) (no behaviors)   Continent Diet  AMBULATORY STATUS COMMUNICATION OF NEEDS Skin   Extensive Assist Verbally Surgical wounds                       Personal Care Assistance Level of Assistance  Bathing, Feeding, Dressing Bathing Assistance: Limited assistance Feeding assistance: Independent Dressing Assistance: Limited assistance     Functional Limitations Info  Sight, Hearing, Speech Sight Info: Adequate Hearing Info: Adequate Speech Info: Adequate    SPECIAL CARE FACTORS FREQUENCY  PT (By licensed PT), OT (By licensed OT)     PT Frequency: 5x wk OT Frequency: 5x wk            Contractures Contractures Info: Not present    Additional Factors Info  Code Status Code Status Info: Full Code             Current Medications (01/14/2016):  This is the current hospital active medication list Current Facility-Administered Medications  Medication Dose Route Frequency Provider Last Rate Last Dose  . 0.9 %  sodium chloride infusion   Intravenous Continuous Domenic Polite, MD 50 mL/hr at 01/14/16 0739    . aspirin EC tablet 325 mg  325 mg Oral BID Danae Orleans, PA-C   325 mg at 01/14/16 2035  . bisacodyl (DULCOLAX) suppository 10 mg  10 mg Rectal Daily PRN Edwin Dada, MD      . buPROPion (WELLBUTRIN XL) 24 hr tablet 150 mg  150 mg Oral Daily Edwin Dada, MD   150 mg at 01/13/16 0945  . ciprofloxacin (CIPRO) tablet 500 mg  500 mg Oral BID Danae Orleans, PA-C   500 mg at 01/14/16 2841  . diltiazem (CARDIZEM) 100 mg in dextrose 5 % 100 mL (1 mg/mL) infusion  5-15 mg/hr Intravenous Titrated Reyne Dumas, MD      . docusate sodium (COLACE) capsule 100 mg  100 mg Oral BID Edwin Dada, MD   100 mg  at 01/13/16 2158  . Ferrous Fumarate (HEMOCYTE - 106 mg FE) tablet 106 mg of iron  106 mg of iron Oral BID PC Edwin Dada, MD   106 mg of iron at 01/14/16 0824  . gabapentin (NEURONTIN) capsule 600 mg  600 mg Oral TID Edwin Dada, MD   600 mg at 01/13/16 2158  . HYDROcodone-acetaminophen (NORCO/VICODIN) 5-325 MG per tablet 1-2 tablet  1-2 tablet Oral Q6H PRN Danae Orleans, PA-C   1 tablet at 01/14/16 0536  . loperamide (IMODIUM) capsule 4 mg  4 mg Oral PRN Edwin Dada, MD      . menthol-cetylpyridinium (CEPACOL) lozenge 3 mg  1 lozenge Oral PRN Danae Orleans, PA-C       Or  . phenol (CHLORASEPTIC) mouth spray 1 spray  1 spray Mouth/Throat PRN Danae Orleans, PA-C      . methimazole (TAPAZOLE) tablet 10 mg  10 mg Oral BID Edwin Dada, MD   10 mg at 01/13/16 2159  . methocarbamol (ROBAXIN) tablet 500 mg  500 mg Oral Q6H PRN Danae Orleans, PA-C       Or  . methocarbamol (ROBAXIN) 500 mg in dextrose 5 % 50 mL IVPB  500 mg Intravenous Q6H PRN Danae Orleans, PA-C      . metoCLOPramide (REGLAN) tablet 5-10 mg  5-10 mg Oral Q8H PRN Danae Orleans, PA-C       Or  . metoCLOPramide (REGLAN) injection 5-10 mg  5-10 mg Intravenous Q8H PRN Danae Orleans, PA-C      . metoprolol tartrate (LOPRESSOR) tablet 12.5 mg  12.5 mg Oral BID Edwin Dada, MD   12.5 mg at 01/13/16 2158  . multivitamin with minerals tablet 1 tablet  1 tablet Oral Daily Nishant Dhungel, MD   1 tablet at 01/13/16 1200  . ondansetron (ZOFRAN) tablet 4 mg  4 mg Oral Q6H PRN Danae Orleans, PA-C       Or  . ondansetron [facility]) injection 4 mg  4 mg Intravenous Q6H PRN Danae Orleans, PA-C      . polyethylene glycol (MIRALAX / GLYCOLAX) packet 17 g  17 g Oral Daily PRN Edwin Dada, MD      . umeclidinium bromide (INCRUSE ELLIPTA) 62.5 MCG/INH 1 puff  1 puff Inhalation Daily Edwin Dada, MD   1 puff at 01/13/16 3244     Discharge Medications: Please see discharge  summary for a list of discharge medications.  Relevant Imaging Results:  Relevant Lab Results:   Additional Information SSN:  [id number]  Kacey Dysert, Randall An, LCSW

## 2016-01-15 DIAGNOSIS — S72009A Fracture of unspecified part of neck of unspecified femur, initial encounter for closed fracture: Secondary | ICD-10-CM | POA: Diagnosis not present

## 2016-01-15 DIAGNOSIS — E0590 Thyrotoxicosis, unspecified without thyrotoxic crisis or storm: Secondary | ICD-10-CM | POA: Diagnosis not present

## 2016-01-15 DIAGNOSIS — Z9189 Other specified personal risk factors, not elsewhere classified: Secondary | ICD-10-CM | POA: Diagnosis not present

## 2016-01-15 DIAGNOSIS — R002 Palpitations: Secondary | ICD-10-CM | POA: Diagnosis not present

## 2016-01-15 DIAGNOSIS — W1830XA Fall on same level, unspecified, initial encounter: Secondary | ICD-10-CM | POA: Diagnosis not present

## 2016-01-15 DIAGNOSIS — I5033 Acute on chronic diastolic (congestive) heart failure: Secondary | ICD-10-CM | POA: Diagnosis not present

## 2016-01-15 DIAGNOSIS — F419 Anxiety disorder, unspecified: Secondary | ICD-10-CM | POA: Diagnosis not present

## 2016-01-15 DIAGNOSIS — G894 Chronic pain syndrome: Secondary | ICD-10-CM | POA: Diagnosis not present

## 2016-01-15 DIAGNOSIS — S098XXA Other specified injuries of head, initial encounter: Secondary | ICD-10-CM | POA: Diagnosis not present

## 2016-01-15 DIAGNOSIS — S72011D Unspecified intracapsular fracture of right femur, subsequent encounter for closed fracture with routine healing: Secondary | ICD-10-CM | POA: Diagnosis not present

## 2016-01-15 DIAGNOSIS — D649 Anemia, unspecified: Secondary | ICD-10-CM | POA: Diagnosis not present

## 2016-01-15 DIAGNOSIS — R1311 Dysphagia, oral phase: Secondary | ICD-10-CM | POA: Diagnosis not present

## 2016-01-15 DIAGNOSIS — K56609 Unspecified intestinal obstruction, unspecified as to partial versus complete obstruction: Secondary | ICD-10-CM | POA: Diagnosis not present

## 2016-01-15 DIAGNOSIS — S72011A Unspecified intracapsular fracture of right femur, initial encounter for closed fracture: Secondary | ICD-10-CM | POA: Diagnosis not present

## 2016-01-15 DIAGNOSIS — D509 Iron deficiency anemia, unspecified: Secondary | ICD-10-CM | POA: Diagnosis not present

## 2016-01-15 DIAGNOSIS — F329 Major depressive disorder, single episode, unspecified: Secondary | ICD-10-CM | POA: Diagnosis not present

## 2016-01-15 DIAGNOSIS — J449 Chronic obstructive pulmonary disease, unspecified: Secondary | ICD-10-CM | POA: Diagnosis not present

## 2016-01-15 DIAGNOSIS — Z23 Encounter for immunization: Secondary | ICD-10-CM | POA: Diagnosis not present

## 2016-01-15 DIAGNOSIS — R2681 Unsteadiness on feet: Secondary | ICD-10-CM | POA: Diagnosis not present

## 2016-01-15 DIAGNOSIS — R609 Edema, unspecified: Secondary | ICD-10-CM | POA: Diagnosis not present

## 2016-01-15 DIAGNOSIS — M6281 Muscle weakness (generalized): Secondary | ICD-10-CM | POA: Diagnosis not present

## 2016-01-15 DIAGNOSIS — G4489 Other headache syndrome: Secondary | ICD-10-CM | POA: Diagnosis not present

## 2016-01-15 DIAGNOSIS — N183 Chronic kidney disease, stage 3 (moderate): Secondary | ICD-10-CM | POA: Diagnosis not present

## 2016-01-15 DIAGNOSIS — R259 Unspecified abnormal involuntary movements: Secondary | ICD-10-CM | POA: Diagnosis not present

## 2016-01-15 DIAGNOSIS — I471 Supraventricular tachycardia: Secondary | ICD-10-CM | POA: Diagnosis not present

## 2016-01-15 DIAGNOSIS — G4700 Insomnia, unspecified: Secondary | ICD-10-CM | POA: Diagnosis not present

## 2016-01-15 DIAGNOSIS — R488 Other symbolic dysfunctions: Secondary | ICD-10-CM | POA: Diagnosis not present

## 2016-01-15 DIAGNOSIS — Z9181 History of falling: Secondary | ICD-10-CM | POA: Diagnosis not present

## 2016-01-15 LAB — CBC
HCT: 31.7 % — ABNORMAL LOW (ref 36.0–46.0)
Hemoglobin: 9.9 g/dL — ABNORMAL LOW (ref 12.0–15.0)
MCH: 29.8 pg (ref 26.0–34.0)
MCHC: 31.2 g/dL (ref 30.0–36.0)
MCV: 95.5 fL (ref 78.0–100.0)
PLATELETS: 203 10*3/uL (ref 150–400)
RBC: 3.32 MIL/uL — ABNORMAL LOW (ref 3.87–5.11)
RDW: 18.4 % — AB (ref 11.5–15.5)
WBC: 7.5 10*3/uL (ref 4.0–10.5)

## 2016-01-15 LAB — BASIC METABOLIC PANEL
Anion gap: 6 (ref 5–15)
BUN: 17 mg/dL (ref 6–20)
CALCIUM: 7.9 mg/dL — AB (ref 8.9–10.3)
CO2: 21 mmol/L — ABNORMAL LOW (ref 22–32)
Chloride: 113 mmol/L — ABNORMAL HIGH (ref 101–111)
Creatinine, Ser: 1.01 mg/dL — ABNORMAL HIGH (ref 0.44–1.00)
GFR calc Af Amer: >60 mL/min (ref >60)
GFR, EST NON AFRICAN AMERICAN: 53 mL/min — AB (ref >60)
GLUCOSE: 94 mg/dL (ref 65–99)
POTASSIUM: 4 mmol/L (ref 3.5–5.1)
Sodium: 140 mmol/L (ref 135–145)

## 2016-01-15 MED ORDER — METOPROLOL TARTRATE 25 MG PO TABS: 12.5000 mg | ORAL_TABLET | Freq: Two times a day (BID) | ORAL | 0 refills | Status: DC

## 2016-01-15 MED ORDER — DOCUSATE SODIUM 100 MG PO CAPS: 100.0000 mg | ORAL_CAPSULE | Freq: Two times a day (BID) | ORAL | 0 refills | Status: DC

## 2016-01-15 MED ORDER — ALPRAZOLAM 0.5 MG PO TABS: 0.5000 mg | ORAL_TABLET | Freq: Two times a day (BID) | ORAL | 0 refills | Status: DC | PRN

## 2016-01-15 NOTE — Discharge Summary (Signed)
Physician Discharge Summary  Dawn [name] YSA:[id number] DOB: August 08, 1940 DOA: 01/11/2016  PCP: [name], MD  Admit date: 01/11/2016 Discharge date: 01/15/2016  Admitted From: Home Disposition:  SNF  Recommendations for Outpatient Follow-up:  1. Follow up with PCP in 2-3 weeks 2. Follow up with orthopedic surgery as scheduled 3. Please note home metoprolol dose was decreased to 12.'5mg'$  bid secondary to hypotension. Please titrate as tolerated. Previous home dose was '25mg'$  bid   Discharge Condition:Stable CODE STATUS:Full Diet recommendation: Regular  Brief/Interim Summary: 76 female with COPD not on home o2, recently diagnosed hyperthyroidism on methimazole presented with fall with rt femoral neck fracture. Post op had tachycardia/hypotension, transferred to SDU. Patient was continued on beta blocker with return of NSR. Patient remained stable   Principal Problem:   Subcapital fracture of right hip, closed, initial encounter ([place]) -Rt hip hemiarthroplasty on 1/2  -post op anemia/hypotension -PT consulting, will need SNF -Ortho started on AS '325mg'$  BID for DVT prophylaxis -Stable for SNF per Ortho recommendations  Hypotension/ Premature/ectopic atrial tachycardia -prior h/o same -Possibly triggered by hypotension and postoperative blood loss. Given cardizem drip briefly. Now stable off it and in sinus. On scheduled low dose metoprolol ( held given low BP) -recent 2d Echo with normal EF and no wall motion abnormality. -Afib documented by prior MD but I do not see any evidence of AFib -Tele reviewed and CV stripes, asked RN to monitor for Afib  -in NSR now    Anemia, iron deficiency/ post operative blood loss -Received 1u PRBC. Hb improved to 8.8 post transfusion.     Chronic pain syndrome -Minimize narcotics given low BP    COPD (chronic obstructive pulmonary disease) (HCC) -Stable. Currently requiring 2L via [place]. Home meds.    CKD (chronic kidney disease) stage 3,  GFR 30-59 ml/min -Stable    Hyperthyroidism -On methimazole. Recent thyroid funtion stable.     Protein-calorie malnutrition, severe Nutrition consult  UTI -stopped cipro/urine cx negative  Discharge Diagnoses:  Principal Problem:   Subcapital fracture of right hip, closed, initial encounter ([place]) Active Problems:   Anemia, iron deficiency   Chronic pain syndrome   COPD (chronic obstructive pulmonary disease) (HCC)   CKD (chronic kidney disease) stage 3, GFR 30-59 ml/min   Hyperthyroidism   Protein-calorie malnutrition, severe    Discharge Instructions  Discharge Instructions    Weight bearing as tolerated    Complete by:  As directed    Laterality:  right   Extremity:  Lower     Allergies as of 01/15/2016   No Known Allergies     Medication List    STOP taking these medications   oxyCODONE 5 MG immediate release tablet Commonly known as:  Oxy IR/ROXICODONE     TAKE these medications   ALPRAZolam 0.5 MG tablet Commonly known as:  XANAX Take 1 tablet (0.5 mg total) by mouth 2 (two) times daily as needed for anxiety.   aspirin 325 MG EC tablet Take 1 tablet (325 mg total) by mouth 2 (two) times daily. Take for 4 weeks.   buPROPion 150 MG 24 hr tablet Commonly known as:  WELLBUTRIN XL Take 150 mg by mouth daily.   docusate sodium 100 MG capsule Commonly known as:  COLACE Take 1 capsule (100 mg total) by mouth 2 (two) times daily.   FERROCITE 324 (106 Fe) MG Tabs tablet Generic drug:  Ferrous Fumarate Take 1 tablet (106 mg of iron total) by mouth 2 (two) times daily after a meal.  gabapentin 600 MG tablet Commonly known as:  NEURONTIN Take 600 mg by mouth 3 (three) times daily.   HYDROcodone-acetaminophen 5-325 MG tablet Commonly known as:  NORCO/VICODIN Take 1-2 tablets by mouth every 6 (six) hours as needed for moderate pain.   INCRUSE ELLIPTA 62.5 MCG/INH Aepb Generic drug:  umeclidinium bromide Inhale 1 puff into the lungs daily.    loperamide 2 MG capsule Commonly known as:  IMODIUM Take 4 mg by mouth as needed for diarrhea or loose stools.   methimazole 10 MG tablet Commonly known as:  TAPAZOLE Take 10 mg by mouth 2 (two) times daily.   methimazole 10 MG tablet Commonly known as:  TAPAZOLE Take 1.5 tablets (15 mg total) by mouth 2 (two) times daily.   metoprolol tartrate 25 MG tablet Commonly known as:  LOPRESSOR Take 0.5 tablets (12.5 mg total) by mouth 2 (two) times daily. What changed:  how much to take      Follow-up Information    Mauri Pole, MD. Schedule an appointment as soon as possible for a visit in 2 week(s).   Specialty:  Orthopedic Surgery Contact information: [address] [place] [address] [phone]        [name], MD. Schedule an appointment as soon as possible for a visit in 2 week(s).   Specialty:  Family Medicine Contact information: [address] [address] [phone]          No Known Allergies  Consultations:  Orthopedic Surgery  Procedures/Studies: Dg Chest 1 View  Result Date: 01/11/2016 CLINICAL DATA:  Right hip pain status post fall EXAM: CHEST 1 VIEW COMPARISON:  12/03/2015 FINDINGS: Lungs are hyperinflated with emphysematous changes. Probable mild fibrosis within the bilateral lung bases. No acute consolidation or effusion. Stable cardiomegaly. Atherosclerosis. No pneumothorax. Old right clavicle fracture. Multiple old right rib fractures. IMPRESSION: 1. Hyperinflation with emphysematous changes.  No acute infiltrate. 2. Stable cardiomegaly. Electronically Signed   By: Donavan Foil M.D.   On: 01/11/2016 22:09   Dg Abd 1 View  Result Date: 01/14/2016 CLINICAL DATA:  Abdominal distention and lower abdominal pain associated with diarrhea. Symptoms began 1 day ago. EXAM: ABDOMEN - 1 VIEW COMPARISON:  AP view of the pelvis dated January 12, 2016 and abdominal and pelvic CT scan of November 17, 2015  FINDINGS: There is moderate gaseous distention of the stomach. There is no evidence of a small or large bowel obstruction. The colonic stool burden is moderate. No free extraluminal gas collections are observed. There is degenerative change in levocurvature of the lumbar spine. 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 There are calcifications in the wall of the abdominal aorta as well as within the splenic artery. IMPRESSION: There is moderate gaseous distention of the stomach. No evidence of small or large bowel obstruction. Moderately increased colonic stool burden may reflect constipation in the appropriate clinical setting. It may be useful to consider the patient for abdominal and pelvic CT scanning. Abdominal aortic atherosclerosis. Electronically Signed   By: David  Martinique M.D.   On: 01/14/2016 14:55   Pelvis Portable  Result Date: 01/12/2016 CLINICAL DATA:  76 y/o  F; status post right hip hemiarthroplasty. EXAM: PORTABLE PELVIS 1-2 VIEWS COMPARISON:  None. FINDINGS: There is a right hip hemiarthroplasty. On the single frontal view there is no apparent hardware related complication and the femoral head component is well seated in the acetabulum. No acute fracture identified. Foley catheter. Postsurgical changes in soft tissues overlying  the right hip. IMPRESSION: Right hip hemiarthroplasty with expected postsurgical changes and without apparent hardware related complication. Electronically Signed   By: [name] M.D.   On: 01/12/2016 16:18   Dg Chest Port 1 View  Result Date: 01/12/2016 CLINICAL DATA:  Initial evaluation for acute hypoxia. History of right hip surgery today. History of COPD. EXAM: PORTABLE CHEST 1 VIEW COMPARISON:  Prior radiograph from 01/11/2016. FINDINGS: Cardiomegaly is stable from prior. Mediastinal silhouette within normal limits. Aortic atherosclerosis noted. Lungs are hyperinflated with underlying changes compatible with COPD. There is diffusely increased  interstitial prominence with a few scattered Kerley B-lines, suggesting pulmonary interstitial edema. A small left pleural effusion is present. No focal infiltrates identified. No pneumothorax. No acute osseous abnormality.  Osteopenia noted. IMPRESSION: 1. Stable cardiomegaly with findings suggestive of mild diffuse pulmonary interstitial edema. 2. Small left pleural effusion. 3. Underlying COPD. 4. Aortic atherosclerosis. Electronically Signed   By: Jeannine Boga M.D.   On: 01/12/2016 22:26   Dg Hip Unilat W Or Wo Pelvis 2-3 Views Right  Result Date: 01/11/2016 CLINICAL DATA:  [place] [address] at home while walking from continue bedroom. EXAM: DG HIP (WITH OR WITHOUT PELVIS) 2-3V RIGHT COMPARISON:  None FINDINGS: There is an acute subcapital right hip fracture with mild varus angulation. No dislocation. No radiographic findings to suggest a pathologic basis for the fracture. IMPRESSION: Subcapital right hip fracture Electronically Signed   By: Andreas Newport M.D.   On: 01/11/2016 22:08    Subjective: No complaints  Discharge Exam: Vitals:   01/15/16 0751 01/15/16 0800  BP:    Pulse: 72   Resp: 18   Temp:  97.5 F (36.4 C)   Vitals:   01/15/16 0503 01/15/16 0600 01/15/16 0751 01/15/16 0800  BP: (!) 100/47 (!) 109/59    Pulse: 64 74 72   Resp: 18 (!) 24 18   Temp:    97.5 F (36.4 C)  TempSrc:    Axillary  SpO2: 100% 97% 96%   Weight:      Height:        General: Pt is alert, awake, not in acute distress Cardiovascular: RRR, S1/S2 +, no rubs, no gallops Respiratory: CTA bilaterally, no wheezing, no rhonchi Abdominal: Soft, NT, ND, bowel sounds + Extremities: no edema, no cyanosis   The results of significant diagnostics from this hospitalization (including imaging, microbiology, ancillary and laboratory) are listed below for reference.     Microbiology: Recent Results (from the past 240 hour(s))  Surgical PCR screen     Status: None   Collection Time: 01/12/16  4:55 AM   Result Value Ref Range Status   MRSA, PCR NEGATIVE NEGATIVE Final   Staphylococcus aureus NEGATIVE NEGATIVE Final    Comment:        The Xpert SA Assay (FDA approved for NASAL specimens in patients over 20 years of age), is one component of a comprehensive surveillance program.  Test performance has been validated by [facility] for patients greater than or equal to 78 year old. It is not intended to diagnose infection nor to guide or monitor treatment.   Urine culture     Status: None   Collection Time: 01/12/16  6:20 PM  Result Value Ref Range Status   Specimen Description URINE, RANDOM  Final   Special Requests NONE  Final   Culture NO GROWTH Performed at [facility]   Final   Report Status 01/14/2016 FINAL  Final     Labs: BNP (last 3  results) No results for input(s): BNP in the last 8760 hours. Basic Metabolic Panel:  Recent Labs Lab 01/11/16 2145 01/12/16 0423 01/13/16 0718 01/14/16 0337 01/15/16 0331  NA 135 138 138 139 140  K 3.9 4.0 4.2 4.5 4.0  CL 106 109 111 114* 113*  CO2 24 24 20* 21* 21*  GLUCOSE 104* 101* 119* 93 94  BUN 22* '19 14 15 17  '$ CREATININE 1.43* 1.10* 0.93 0.99 1.01*  CALCIUM 7.9* 7.5* 7.4* 7.7* 7.9*   Liver Function Tests: No results for input(s): AST, ALT, ALKPHOS, BILITOT, PROT, ALBUMIN in the last 168 hours. No results for input(s): LIPASE, AMYLASE in the last 168 hours. No results for input(s): AMMONIA in the last 168 hours. CBC:  Recent Labs Lab 01/11/16 2145 01/12/16 0423 01/13/16 0014 01/13/16 0718 01/14/16 0337 01/15/16 0331  WBC 10.8* 8.3  --  3.9* 5.6 7.5  NEUTROABS 9.3*  --   --   --   --   --   HGB 9.6* 9.0* 7.3* 8.8* 8.7* 9.9*  HCT 30.5* 28.0* 22.9* 27.5* 27.2* 31.7*  MCV 93.3 95.6  --  93.2 94.1 95.5  PLT 223 178  --  131* 151 203   Cardiac Enzymes:  Recent Labs Lab 01/12/16 1916 01/13/16 0014 01/13/16 0718  TROPONINI 0.10* 0.07* 0.06*   BNP: Invalid input(s): POCBNP CBG: No results for  input(s): GLUCAP in the last 168 hours. D-Dimer No results for input(s): DDIMER in the last 72 hours. Hgb A1c No results for input(s): HGBA1C in the last 72 hours. Lipid Profile No results for input(s): CHOL, HDL, LDLCALC, TRIG, CHOLHDL, LDLDIRECT in the last 72 hours. Thyroid function studies No results for input(s): TSH, T4TOTAL, T3FREE, THYROIDAB in the last 72 hours.  Invalid input(s): FREET3 Anemia work up No results for input(s): VITAMINB12, FOLATE, FERRITIN, TIBC, IRON, RETICCTPCT in the last 72 hours. Urinalysis    Component Value Date/Time   COLORURINE AMBER (A) 01/12/2016 0452   APPEARANCEUR CLOUDY (A) 01/12/2016 0452   LABSPEC 1.015 01/12/2016 0452   PHURINE 5.0 01/12/2016 0452   GLUCOSEU NEGATIVE 01/12/2016 0452   HGBUR SMALL (A) 01/12/2016 0452   BILIRUBINUR NEGATIVE 01/12/2016 0452   KETONESUR NEGATIVE 01/12/2016 0452   PROTEINUR 30 (A) 01/12/2016 0452   UROBILINOGEN 0.2 04/29/2008 2235   NITRITE POSITIVE (A) 01/12/2016 0452   LEUKOCYTESUR MODERATE (A) 01/12/2016 0452   Sepsis Labs Invalid input(s): PROCALCITONIN,  WBC,  LACTICIDVEN Microbiology Recent Results (from the past 240 hour(s))  Surgical PCR screen     Status: None   Collection Time: 01/12/16  4:55 AM  Result Value Ref Range Status   MRSA, PCR NEGATIVE NEGATIVE Final   Staphylococcus aureus NEGATIVE NEGATIVE Final    Comment:        The Xpert SA Assay (FDA approved for NASAL specimens in patients over 81 years of age), is one component of a comprehensive surveillance program.  Test performance has been validated by [facility] for patients greater than or equal to 4 year old. It is not intended to diagnose infection nor to guide or monitor treatment.   Urine culture     Status: None   Collection Time: 01/12/16  6:20 PM  Result Value Ref Range Status   Specimen Description URINE, RANDOM  Final   Special Requests NONE  Final   Culture NO GROWTH Performed at [facility]   Final    Report Status 01/14/2016 FINAL  Final     SIGNED:   Marylu Lund  K, MD  [facility] 01/15/2016, 8:56 AM  If 7PM-7AM, please contact night-coverage [email or web address] Password [email or web address]

## 2016-01-15 NOTE — Clinical Social Work Placement (Signed)
   CLINICAL SOCIAL WORK PLACEMENT  NOTE  Date:  01/15/2016  Patient Details  Name: Dawn [name] MRN: [id number] Date of Birth: 1940/08/28  Clinical Social Work is seeking post-discharge placement for this patient at the [place] level of care (*CSW will initial, date and re-position this form in  chart as items are completed):  Yes   Patient/family provided with [place] Work Department's list of facilities offering this level of care within the geographic area requested by the patient (or if unable, by the patient's family).  Yes   Patient/family informed of their freedom to choose among providers that offer the needed level of care, that participate in Medicare, Medicaid or managed care program needed by the patient, have an available bed and are willing to accept the patient.  Yes   Patient/family informed of [place]'s ownership interest in [facility] and [facility], as well as of the fact that they are under no obligation to receive care at these facilities.  PASRR submitted to EDS on 01/14/16     PASRR number received on 01/14/16     Existing PASRR number confirmed on       FL2 transmitted to all facilities in geographic area requested by pt/family on 01/14/16     FL2 transmitted to all facilities within larger geographic area on       Patient informed that his/her managed care company has contracts with or will negotiate with certain facilities, including the following:        Yes   Patient/family informed of bed offers received.  Patient chooses bed at [place] recommends and patient chooses bed at      Patient to be transferred to [facility] on 01/15/16.  Patient to be transferred to facility by [facility]     Patient family notified on 01/15/16 of transfer.  Name of family member notified:  DAUGHTER     PHYSICIAN       Additional Comment: Pt / daughter are in  agreement with d/c to [facility] today. [facility] transport is needed.Medical necessity form completed. Pt / daughter are aware out of pocket costs may be associated with [facility] transport. D/C Summary sent to SNF for review. Scripts included in d/c packet. # for report provided to nsg.   _______________________________________________ Luretha Rued, [place] 01/15/2016, 3:15 PM

## 2016-01-15 NOTE — Clinical Social Work Placement (Signed)
   CLINICAL SOCIAL WORK PLACEMENT  NOTE  Date:  01/15/2016  Patient Details  Name: Dawn [name] MRN: [id number] Date of Birth: [birth year]  Clinical Social Work is seeking post-discharge placement for this patient at the [place] level of care (*CSW will initial, date and re-position this form in  chart as items are completed):  Yes   Patient/family provided with [place] Work Department's list of facilities offering this level of care within the geographic area requested by the patient (or if unable, by the patient's family).  Yes   Patient/family informed of their freedom to choose among providers that offer the needed level of care, that participate in Medicare, Medicaid or managed care program needed by the patient, have an available bed and are willing to accept the patient.  Yes   Patient/family informed of [place]'s ownership interest in [facility] and [facility], as well as of the fact that they are under no obligation to receive care at these facilities.  PASRR submitted to EDS on 01/14/16     PASRR number received on 01/14/16     Existing PASRR number confirmed on       FL2 transmitted to all facilities in geographic area requested by pt/family on 01/14/16     FL2 transmitted to all facilities within larger geographic area on       Patient informed that his/her managed care company has contracts with or will negotiate with certain facilities, including the following:        Yes   Patient/family informed of bed offers received.  Patient chooses bed at       Physician recommends and patient chooses bed at      Patient to be transferred to   on  .  Patient to be transferred to facility by       Patient family notified on   of transfer.  Name of family member notified:        PHYSICIAN       Additional Comment:    _______________________________________________ Luretha Rued, [place] 01/15/2016, 7:46 AM

## 2016-01-15 NOTE — Progress Notes (Signed)
     Subjective: 3 Days Post-Op Procedure(s) (LRB): ARTHROPLASTY BIPOLAR HIP (HEMIARTHROPLASTY) (Right)   Patient reports pain as mild, pain controlled. Resting comfortably in bed.  No reported events throughout the night.   Objective:   VITALS:   Vitals:   01/15/16 0503 01/15/16 0600  BP: (!) 100/47 (!) 109/59  Pulse: 64 74  Resp: 18 (!) 24  Temp:      Dorsiflexion/Plantar flexion intact Incision: dressing C/D/I No cellulitis present Compartment soft  LABS  Recent Labs  01/13/16 0718 01/14/16 0337 01/15/16 0331  HGB 8.8* 8.7* 9.9*  HCT 27.5* 27.2* 31.7*  WBC 3.9* 5.6 7.5  PLT 131* 151 203     Recent Labs  01/13/16 0718 01/14/16 0337 01/15/16 0331  NA 138 139 140  K 4.2 4.5 4.0  BUN '14 15 17  '$ CREATININE 0.93 0.99 1.01*  GLUCOSE 119* 93 94     Assessment/Plan: 3 Days Post-Op Procedure(s) (LRB): ARTHROPLASTY BIPOLAR HIP (HEMIARTHROPLASTY) (Right) Up with therapy Orthopaedically stable Discharge when appropriate per medicine  Ortho recommendations:  ASA 325 mg bid for 4 weeks for anticoagulation, unless other medically indicated.  Norcofor pain management (Rx written).  MiraLax and Colace for constipation  Iron 325 mg tid for 2-3 weeks   WBAT on the rightleg.  Dressing to remain in place until follow in clinic in 2 weeks.  Dressing is waterproof and may shower with it in place.  Follow up in 2 weeks at [facility]. Follow up with [name],Henrry Feil D in 2 weeks.  Contact information:  [facility] [address], [address] [place]   PAC  01/15/2016, 7:31 AM

## 2016-01-16 LAB — TYPE AND SCREEN
BLOOD PRODUCT EXPIRATION DATE: 201801122359
Blood Product Expiration Date: 201801122359
ISSUE DATE / TIME: 201801021413
ISSUE DATE / TIME: 201801030217
UNIT TYPE AND RH: 5100
Unit Type and Rh: 5100

## 2016-01-18 ENCOUNTER — Encounter: Payer: Self-pay | Admitting: Internal Medicine

## 2016-01-18 ENCOUNTER — Non-Acute Institutional Stay (SKILLED_NURSING_FACILITY): Payer: Medicare Other | Admitting: Internal Medicine

## 2016-01-18 DIAGNOSIS — J449 Chronic obstructive pulmonary disease, unspecified: Secondary | ICD-10-CM

## 2016-01-18 DIAGNOSIS — S72011A Unspecified intracapsular fracture of right femur, initial encounter for closed fracture: Secondary | ICD-10-CM

## 2016-01-18 DIAGNOSIS — D649 Anemia, unspecified: Secondary | ICD-10-CM | POA: Diagnosis not present

## 2016-01-18 DIAGNOSIS — Z9189 Other specified personal risk factors, not elsewhere classified: Secondary | ICD-10-CM

## 2016-01-18 DIAGNOSIS — I471 Supraventricular tachycardia: Secondary | ICD-10-CM

## 2016-01-18 NOTE — Patient Instructions (Signed)
See Current Assessment & Plan in Problem List under specific Diagnosis 

## 2016-01-18 NOTE — Assessment & Plan Note (Signed)
DVT prophylaxis with 325 mg of aspirin twice a day DT/OT at [facility]

## 2016-01-18 NOTE — Assessment & Plan Note (Signed)
I have told patient that her hip pain should improve over  time postoperatively. I discussed the risk of respiratory suppression with her  As of 01/25/16,narcotic dose should be decreased to 5/325 milligrams every 6 hours as needed with further weekly weaning

## 2016-01-18 NOTE — Assessment & Plan Note (Signed)
May need extended Holter monitor as an outpatient in view of fall and hip fracture without definite etiology

## 2016-01-18 NOTE — Assessment & Plan Note (Addendum)
Continue nasal O2 and pulmonary toilet Consider pulmonary consultation as patient states dyspnea is progressive

## 2016-01-18 NOTE — Assessment & Plan Note (Signed)
Continue iron  

## 2016-01-18 NOTE — Progress Notes (Signed)
This is a comprehensive admission note to [facility] performed on this date less than 30 days from date of admission. Included are preadmission medical/surgical history;reconciled medication list; family history; social history and comprehensive review of systems.  Corrections and additions to the records were documented . Comprehensive physical exam was also performed. Additionally a clinical summary was entered for each active diagnosis pertinent to this admission in the Problem List to enhance continuity of care.  VQQ:VZDGL, Betti MD  HPI: The patient was hospitalized 1/1-01/15/16 with a subcapital fracture of right hip sustained when she fell to her bedroom floor. She denied any definite cardiac or neurologic prodrome prior to the fall. She had been up walking for several minutes when she simply fell ,dropping the glass of milk she was carrying..  She underwent right hip hemiarthroplasty on 1/2. Peroperatively she developed anemia and hypotension.She received 1 unit of packed cells with hemoglobin improvement to 8.8. 325 mg of aspirin twice a day was initiated for be for DVT prophylaxis. Also during the hospitalization she was noted to have" atrial fibrillation" according to her;but  the records state that she had ectopic atrial tachycardia and received  cardizem drip short-term. Echocardiogram revealed normal ejection fraction and no wall motion abnormalities. Notes state that her PCP has noted paroxysmal atrial fibrillation. Apparently she had a home Holter monitor.  Past medical and surgical history : Spinal stenosis, chronic kidney disease with GFR 30-59, hyperthyroidism, anxiety/depression, and advanced COPD. Until her hip arthroplasty 01/12/16 her only surgeries had been flexible sigmoidoscopy.  Social history: Reviewed  Family history: Reviewed  Review of systems: She feels that her shortness of breath has progressed over the last year despite oxygen. Her  stools have  been black but she is on iron. She has noted "occasional skips" in her heart without associated sequelae. She has an intermittent nonproductive cough. She admits to ongoing anxiety. Constitutional: No fever,significant weight change  Eyes: No redness, discharge, pain, vision change ENT/mouth: No nasal congestion,  purulent discharge, earache,change in hearing ,sore throat  Cardiovascular: No chest pain, paroxysmal nocturnal dyspnea, claudication, edema  Respiratory: No  sputum production,hemoptysis, significant snoring,apnea   Gastrointestinal: No heartburn,dysphagia,abdominal pain, nausea / vomiting,rectal bleeding Genitourinary: No dysuria,hematuria, pyuria,  incontinence, nocturia Dermatologic: No rash, pruritus, change in appearance of skin Neurologic: No dizziness,headache,syncope, seizures, numbness , tingling Endocrine: No change in hair/skin/ nails, excessive thirst, excessive hunger, excessive urination  Hematologic/lymphatic: No significant bruising, lymphadenopathy,abnormal bleeding Allergy/immunology: No itchy/ watery eyes, significant sneezing, urticaria, angioedema  Physical exam:  Pertinent or positive findings: She has unsustained nystagmus with lateral gaze bilaterally. She has an upper partial. The mandible is edentulous. Nasal O2 being worn. Breath sounds are decreased. Heart rhythm is irregular. Pedal pulses are decreased. Clubbing is present. No edema is appreciated.  General appearance:Adequately nourished; no acute distress , increased work of breathing is present.   Lymphatic: No lymphadenopathy about the head, neck, axilla . Eyes: No conjunctival inflammation or lid edema is present. There is no scleral icterus. Ears:  External ear exam shows no significant lesions or deformities.   Nose:  External nasal examination shows no deformity or inflammation. Nasal mucosa are pink and moist without lesions ,exudates Oral exam: lips and gums are healthy appearing.There is no  oropharyngeal erythema or exudate . Neck:  No thyromegaly, masses, tenderness noted.    Heart:  No gallop, murmur, click, rub .  Lungs:Chest clear to auscultation without wheezes, rhonchi,rales , rubs. Abdomen:Bowel sounds are normal. Abdomen is soft  and nontender with no organomegaly, hernias,masses. GU: deferred Extremities:  No cyanosis Neurologic exam : Balance,Rhomberg,finger to nose testing could not be completed due to clinical state Deep tendon reflexes are equal in UE Skin: Warm & dry w/o tenting. No significant lesions or rash.  See clinical summary under each active problem in the Problem List with associated updated therapeutic plan

## 2016-01-20 ENCOUNTER — Non-Acute Institutional Stay (SKILLED_NURSING_FACILITY): Payer: Medicare Other | Admitting: Nurse Practitioner

## 2016-01-20 ENCOUNTER — Encounter: Payer: Self-pay | Admitting: Nurse Practitioner

## 2016-01-20 DIAGNOSIS — F419 Anxiety disorder, unspecified: Secondary | ICD-10-CM | POA: Diagnosis not present

## 2016-01-20 DIAGNOSIS — G4700 Insomnia, unspecified: Secondary | ICD-10-CM | POA: Diagnosis not present

## 2016-01-20 DIAGNOSIS — E0590 Thyrotoxicosis, unspecified without thyrotoxic crisis or storm: Secondary | ICD-10-CM

## 2016-01-20 DIAGNOSIS — R609 Edema, unspecified: Secondary | ICD-10-CM

## 2016-01-20 DIAGNOSIS — R002 Palpitations: Secondary | ICD-10-CM | POA: Diagnosis not present

## 2016-01-20 NOTE — Progress Notes (Signed)
Nursing Home Location:  [facility] Room number: 119  Place of Service: SNF (31)  PCP: [name], MD  No Known Allergies  Chief Complaint  Patient presents with  . Acute Visit    Increased anxiety, SOB    HPI:  Patient is a 76 y.o. female seen today at [facility] due to worsening anxiety, shortness of breath and insomnia. Pt has been at [facility] for rehab since 01/15/16 after fall with subcapital fracture of right hip s/p right hip hemiarthroplasty on 01/12/16. Therapy staff reports pts visible shortness of breath and reports anxiety. She is unable to sleep at night. Reports they are not giving her trazodone which she took at home and has been taking for a while for sleep and mood. Feels like the Wellbutrin is not helping much. denies shortness of breath, does not have a hard time breathing, no pain with breathing.   Reports palpitations.  Chronic pain to low back which is unchanged.  Reports swelling to right leg with pain to leg.   Review of Systems:  Review of Systems  Constitutional: Positive for activity change. Negative for appetite change, chills, fatigue, fever and unexpected weight change.  HENT: Negative for tinnitus.   Eyes: Negative.   Respiratory: Negative for cough, chest tightness, shortness of breath and wheezing.   Cardiovascular: Positive for palpitations and leg swelling (right). Negative for chest pain.  Gastrointestinal: Negative for abdominal pain, constipation and diarrhea.  Genitourinary: Negative for difficulty urinating, dysuria, frequency and urgency.  Musculoskeletal: Positive for arthralgias, back pain and myalgias.  Skin: Negative.  Negative for color change and wound.  Neurological: Negative for dizziness, weakness and headaches.  Psychiatric/Behavioral: Positive for dysphoric mood and sleep disturbance. Negative for agitation, behavioral problems and confusion. The patient is nervous/anxious.     Past Medical History:    Diagnosis Date  . COPD (chronic obstructive pulmonary disease) ([place])   . History of anxiety   . History of depression   . Spinal stenosis   . UTI (urinary tract infection)    Past Surgical History:  Procedure Laterality Date  . FLEXIBLE SIGMOIDOSCOPY Left 11/18/2015   Procedure: FLEXIBLE SIGMOIDOSCOPY;  Surgeon: [name], MD;  Location: [place];  Service: Endoscopy;  Laterality: Left;  . FLEXIBLE SIGMOIDOSCOPY N/A 11/20/2015   Procedure: FLEXIBLE SIGMOIDOSCOPY;  Surgeon: [name], MD;  Location: [facility] ENDOSCOPY;  Service: Endoscopy;  Laterality: N/A;  . HIP ARTHROPLASTY Right 01/12/2016   Procedure: ARTHROPLASTY BIPOLAR HIP (HEMIARTHROPLASTY);  Surgeon: [name], MD;  Location: [facility];  Service: Orthopedics;  Laterality: Right;   Social History:   reports that she has quit smoking. She has never used smokeless tobacco. She reports that she does not drink alcohol or use drugs.  Family History  Problem Relation Age of Onset  . Heart attack Mother   . Diabetes Father   . Thyroid disease Neg Hx     Medications: Patient's Medications  New Prescriptions   No medications on file  Previous Medications   ALPRAZOLAM (XANAX) 0.5 MG TABLET    Take 1 tablet (0.5 mg total) by mouth 2 (two) times daily as needed for anxiety.   AMBULATORY NON FORMULARY MEDICATION    Give 120 ml MedPass twice daily.   ASPIRIN EC 325 MG EC TABLET    Take 1 tablet (325 mg total) by mouth 2 (two) times daily. Take for 4 weeks.   BUPROPION (WELLBUTRIN XL) 150 MG 24 HR TABLET    Take 150 mg by  mouth daily.   DOCUSATE SODIUM (COLACE) 100 MG CAPSULE    Take 1 capsule (100 mg total) by mouth 2 (two) times daily.   FERROUS SULFATE 325 (65 FE) MG TABLET    Take 325 mg by mouth 2 (two) times daily after a meal.   GABAPENTIN (NEURONTIN) 600 MG TABLET    Take 600 mg by mouth 3 (three) times daily.   HYDROCODONE-ACETAMINOPHEN (NORCO) 7.5-325 MG TABLET    Take 1 tablet by mouth every 6 (six) hours as needed for  moderate pain.   INCRUSE ELLIPTA 62.5 MCG/INH AEPB    Inhale 1 puff into the lungs daily.   LOPERAMIDE (IMODIUM) 2 MG CAPSULE    Take 4 mg by mouth as needed for diarrhea or loose stools.   METHIMAZOLE (TAPAZOLE) 10 MG TABLET    Take 10 mg by mouth 2 (two) times daily.   METHIMAZOLE (TAPAZOLE) 10 MG TABLET    Take 10 mg by mouth 2 (two) times daily.   METOPROLOL TARTRATE (LOPRESSOR) 25 MG TABLET    Take 0.5 tablets (12.5 mg total) by mouth 2 (two) times daily.  Modified Medications   No medications on file  Discontinued Medications   FERROCITE 324 MG TABS TABLET    Take 1 tablet (106 mg of iron total) by mouth 2 (two) times daily after a meal.   HYDROCODONE-ACETAMINOPHEN (NORCO/VICODIN) 5-325 MG TABLET    Take 1-2 tablets by mouth every 6 (six) hours as needed for moderate pain.   METHIMAZOLE (TAPAZOLE) 10 MG TABLET    Take 1.5 tablets (15 mg total) by mouth 2 (two) times daily.     Physical Exam: Vitals:   01/20/16 1007  BP: 101/72  Pulse: 76  Resp: 20  Temp: 97.8 F (36.6 C)  SpO2: 100%  Weight: 132 lb (59.9 kg)  Height: '5\' 8"'$  (1.727 m)    Physical Exam  Constitutional: She is oriented to person, place, and time. She appears well-developed and well-nourished. No distress.  HENT:  Head: Normocephalic and atraumatic.  Mouth/Throat: Oropharynx is clear and moist. No oropharyngeal exudate.  Eyes: Conjunctivae are normal. Pupils are equal, round, and reactive to light.  Neck: Normal range of motion. Neck supple.  Cardiovascular: Normal rate and normal heart sounds.  An irregular rhythm present.  Pulmonary/Chest: Effort normal and breath sounds normal.  Abdominal: Soft. Bowel sounds are normal.  Musculoskeletal: She exhibits edema (right lower extermity ). She exhibits no tenderness.  Neurological: She is alert and oriented to person, place, and time.  Skin: Skin is warm and dry. She is not diaphoretic.  Psychiatric: She has a normal mood and affect.    Labs reviewed: Basic  Metabolic Panel:  Recent Labs  11/12/15 0455 11/13/15 0129  11/14/15 0556  11/19/15 0428  12/02/15 0459  01/13/16 0718 01/14/16 0337 01/15/16 0331  NA 139 139  < > 139  < > 138  < > 137  137  < > 138 139 140  K 4.2 3.6  < > 3.0*  < > 4.7  < > 3.8  3.8  < > 4.2 4.5 4.0  CL 108 106  --  109  < > 97*  < > 103  < > 111 114* 113*  CO2 25 25  --  24  < > 35*  < > 27  < > 20* 21* 21*  GLUCOSE 92 125*  --  86  < > 88  < > 94  < > 119* 93 94  BUN 10 13  < > 11  < > 14  < > 8  8  < > '14 15 17  '$ CREATININE 0.93 1.10*  < > 0.90  < > 1.19*  < > 0.96  1.0  < > 0.93 0.99 1.01*  CALCIUM 7.6* 7.3*  --  7.3*  < > 8.1*  < > 8.0*  < > 7.4* 7.7* 7.9*  MG 1.5* 2.4  --  1.9  --  2.4  --  1.8  --   --   --   --   PHOS 1.9* 3.7  --  3.2  --   --   --   --   --   --   --   --   < > = values in this interval not displayed. Liver Function Tests:  Recent Labs  11/30/15 1850 12/02/15 0459 12/10/15 1524  AST 11*  11* 8* 8  ALT 9*  9 6* 6  ALKPHOS 68  68 56 64  BILITOT 0.3 0.3 0.4  PROT 5.7* 5.2* 6.8  ALBUMIN 2.6* 2.2* 3.1*   No results for input(s): LIPASE, AMYLASE in the last 8760 hours.  Recent Labs  12/01/15 0047  AMMONIA 11   CBC:  Recent Labs  12/01/15 1215 12/02/15 0459  01/11/16 2145  01/13/16 0718 01/14/16 0337 01/15/16 0331  WBC 3.6  3.6* 4.3  4.3  < > 10.8*  < > 3.9* 5.6 7.5  NEUTROABS 2.6 2.9  --  9.3*  --   --   --   --   HGB 8.9*  8.9* 7.6*  7.6*  < > 9.6*  < > 8.8* 8.7* 9.9*  HCT 29.4*  29* 25.4*  25*  < > 30.5*  < > 27.5* 27.2* 31.7*  MCV 95.5 93.4  < > 93.3  < > 93.2 94.1 95.5  PLT 182  182 172  172  < > 223  < > 131* 151 203  < > = values in this interval not displayed. TSH:  Recent Labs  11/10/15 0903 12/01/15 1201 01/06/16 1608  TSH 0.289* 0.034* 4.936*   A1C: Lab Results  Component Value Date   HGBA1C 5.2 12/01/2015   Lipid Panel:  Recent Labs  12/01/15 0644  CHOL 107  HDL 27*  LDLCALC 59  TRIG 107  CHOLHDL 4.0    Radiological  Exams: Dg Chest 1 View  Result Date: 01/11/2016 CLINICAL DATA:  Right hip pain status post fall EXAM: CHEST 1 VIEW COMPARISON:  12/03/2015 FINDINGS: Lungs are hyperinflated with emphysematous changes. Probable mild fibrosis within the bilateral lung bases. No acute consolidation or effusion. Stable cardiomegaly. Atherosclerosis. No pneumothorax. Old right clavicle fracture. Multiple old right rib fractures. IMPRESSION: 1. Hyperinflation with emphysematous changes.  No acute infiltrate. 2. Stable cardiomegaly. Electronically Signed   By: Donavan Foil M.D.   On: 01/11/2016 22:09   Pelvis Portable  Result Date: 01/12/2016 CLINICAL DATA:  76 y/o  F; status post right hip hemiarthroplasty. EXAM: PORTABLE PELVIS 1-2 VIEWS COMPARISON:  None. FINDINGS: There is a right hip hemiarthroplasty. On the single frontal view there is no apparent hardware related complication and the femoral head component is well seated in the acetabulum. No acute fracture identified. Foley catheter. Postsurgical changes in soft tissues overlying the right hip. IMPRESSION: Right hip hemiarthroplasty with expected postsurgical changes and without apparent hardware related complication. Electronically Signed   By: [name] M.D.   On: 01/12/2016 16:18   Dg Chest Port 1  View  Result Date: 01/12/2016 CLINICAL DATA:  Initial evaluation for acute hypoxia. History of right hip surgery today. History of COPD. EXAM: PORTABLE CHEST 1 VIEW COMPARISON:  Prior radiograph from 01/11/2016. FINDINGS: Cardiomegaly is stable from prior. Mediastinal silhouette within normal limits. Aortic atherosclerosis noted. Lungs are hyperinflated with underlying changes compatible with COPD. There is diffusely increased interstitial prominence with a few scattered Kerley B-lines, suggesting pulmonary interstitial edema. A small left pleural effusion is present. No focal infiltrates identified. No pneumothorax. No acute osseous abnormality.  Osteopenia  noted. IMPRESSION: 1. Stable cardiomegaly with findings suggestive of mild diffuse pulmonary interstitial edema. 2. Small left pleural effusion. 3. Underlying COPD. 4. Aortic atherosclerosis. Electronically Signed   By: Jeannine Boga M.D.   On: 01/12/2016 22:26   Dg Hip Unilat W Or Wo Pelvis 2-3 Views Right  Result Date: 01/11/2016 CLINICAL DATA:  [address] at home while walking from continue bedroom. EXAM: DG HIP (WITH OR WITHOUT PELVIS) 2-3V RIGHT COMPARISON:  None FINDINGS: There is an acute subcapital right hip fracture with mild varus angulation. No dislocation. No radiographic findings to suggest a pathologic basis for the fracture. IMPRESSION: Subcapital right hip fracture Electronically Signed   By: Andreas Newport M.D.   On: 01/11/2016 22:08    Assessment/Plan 1. Anxiety Worsening anxiety while at [facility]. This could be multifactorial as her thyroid medication was reduced but she is getting her previous dose, recent hip fracture, and not sleeping. Also pts trazodone which was managing sleep and mood has been stopped on hospital admission after fall.   2. Hyperthyroidism Reviewed labs in epic by endocrinologist, per note:  the lab result is low normal. Need to reduce Methimazole form '10mg'$  2x daily to HALF tab daily only Will d/C current dose and start methimazole 5 mg daily  Pt has follow up scheduled with endocrinology   3. Insomnia, unspecified type Will start trazodone 50 mg PO qhs at this time to help with insomnia, she was previously on 100 mg but hopefully dose reduction will be effective.   4. Palpitations May be due to abnormal TSH, will get EKG and Holter monitor to follow up at this time.   5. Edema May be post op edema however with irregular heart sounds and recent surgery, will get venous doppler to rule out DVT    Lyn Joens K. Harle Battiest  [facility] [phone] 8 am - 5 pm) [phone] (after hours)

## 2016-01-21 ENCOUNTER — Telehealth: Payer: Self-pay | Admitting: Interventional Cardiology

## 2016-01-21 ENCOUNTER — Other Ambulatory Visit: Payer: Self-pay

## 2016-01-21 MED ORDER — ALPRAZOLAM 0.5 MG PO TABS: 0.5000 mg | ORAL_TABLET | Freq: Two times a day (BID) | ORAL | 0 refills | Status: DC | PRN

## 2016-01-21 NOTE — Telephone Encounter (Signed)
I contacted [employer] monitor company. Your moms cardiac event monitor service was terminated by the monitor company after they did not receive transmissions for 5 days, (during your mothers hospital stay).  I have asked the company to reinitiate service up to the original end of service date which will be 01-30-16.  When you deliver the monitor to [facility] and attach the monitor to your mother. Please call or have staff call [employer].  Their number is on the front of the monitor.  Let them know the monitor has been reapplied to continue service as discussed until 01/30/16.

## 2016-01-21 NOTE — Telephone Encounter (Signed)
New Message    Daughter is call pt fell and broke hip, was put in nursing home, she was wearing one of our halter monitors, and the nursing home would not let her wear it, what does she need to do??

## 2016-01-21 NOTE — Telephone Encounter (Signed)
Prescription request was received from:    [facility] [address] [place] [address]  Phone: [phone] Fax: [phone] 

## 2016-01-23 ENCOUNTER — Inpatient Hospital Stay (HOSPITAL_COMMUNITY)
Admission: EM | Admit: 2016-01-23 | Discharge: 2016-01-28 | DRG: 291 | Disposition: A | Discharge status: Skilled Nursing Facility | Payer: Medicare Other | Attending: Internal Medicine | Admitting: Internal Medicine

## 2016-01-23 ENCOUNTER — Observation Stay (HOSPITAL_COMMUNITY): Payer: Medicare Other

## 2016-01-23 ENCOUNTER — Emergency Department (HOSPITAL_COMMUNITY): Payer: Medicare Other

## 2016-01-23 ENCOUNTER — Encounter (HOSPITAL_COMMUNITY): Payer: Self-pay

## 2016-01-23 DIAGNOSIS — R195 Other fecal abnormalities: Secondary | ICD-10-CM

## 2016-01-23 DIAGNOSIS — I4719 Other supraventricular tachycardia: Secondary | ICD-10-CM

## 2016-01-23 DIAGNOSIS — S0101XA Laceration without foreign body of scalp, initial encounter: Secondary | ICD-10-CM | POA: Diagnosis present

## 2016-01-23 DIAGNOSIS — I272 Pulmonary hypertension, unspecified: Secondary | ICD-10-CM | POA: Diagnosis present

## 2016-01-23 DIAGNOSIS — W01198A Fall on same level from slipping, tripping and stumbling with subsequent striking against other object, initial encounter: Secondary | ICD-10-CM | POA: Diagnosis present

## 2016-01-23 DIAGNOSIS — Z7982 Long term (current) use of aspirin: Secondary | ICD-10-CM

## 2016-01-23 DIAGNOSIS — I5033 Acute on chronic diastolic (congestive) heart failure: Secondary | ICD-10-CM | POA: Diagnosis present

## 2016-01-23 DIAGNOSIS — E43 Unspecified severe protein-calorie malnutrition: Secondary | ICD-10-CM | POA: Diagnosis not present

## 2016-01-23 DIAGNOSIS — W19XXXA Unspecified fall, initial encounter: Secondary | ICD-10-CM

## 2016-01-23 DIAGNOSIS — Z79899 Other long term (current) drug therapy: Secondary | ICD-10-CM

## 2016-01-23 DIAGNOSIS — F329 Major depressive disorder, single episode, unspecified: Secondary | ICD-10-CM | POA: Diagnosis present

## 2016-01-23 DIAGNOSIS — Z87891 Personal history of nicotine dependence: Secondary | ICD-10-CM

## 2016-01-23 DIAGNOSIS — Z833 Family history of diabetes mellitus: Secondary | ICD-10-CM

## 2016-01-23 DIAGNOSIS — R64 Cachexia: Secondary | ICD-10-CM | POA: Diagnosis present

## 2016-01-23 DIAGNOSIS — Z8249 Family history of ischemic heart disease and other diseases of the circulatory system: Secondary | ICD-10-CM

## 2016-01-23 DIAGNOSIS — I5031 Acute diastolic (congestive) heart failure: Secondary | ICD-10-CM

## 2016-01-23 DIAGNOSIS — I471 Supraventricular tachycardia, unspecified: Secondary | ICD-10-CM | POA: Diagnosis present

## 2016-01-23 DIAGNOSIS — D500 Iron deficiency anemia secondary to blood loss (chronic): Secondary | ICD-10-CM

## 2016-01-23 DIAGNOSIS — S199XXA Unspecified injury of neck, initial encounter: Secondary | ICD-10-CM | POA: Diagnosis not present

## 2016-01-23 DIAGNOSIS — I071 Rheumatic tricuspid insufficiency: Secondary | ICD-10-CM | POA: Diagnosis present

## 2016-01-23 DIAGNOSIS — R0902 Hypoxemia: Secondary | ICD-10-CM | POA: Diagnosis not present

## 2016-01-23 DIAGNOSIS — J811 Chronic pulmonary edema: Secondary | ICD-10-CM | POA: Diagnosis not present

## 2016-01-23 DIAGNOSIS — G894 Chronic pain syndrome: Secondary | ICD-10-CM | POA: Diagnosis present

## 2016-01-23 DIAGNOSIS — K56699 Other intestinal obstruction unspecified as to partial versus complete obstruction: Secondary | ICD-10-CM | POA: Diagnosis not present

## 2016-01-23 DIAGNOSIS — R05 Cough: Secondary | ICD-10-CM | POA: Diagnosis not present

## 2016-01-23 DIAGNOSIS — E876 Hypokalemia: Secondary | ICD-10-CM | POA: Diagnosis not present

## 2016-01-23 DIAGNOSIS — J449 Chronic obstructive pulmonary disease, unspecified: Secondary | ICD-10-CM | POA: Diagnosis present

## 2016-01-23 DIAGNOSIS — F339 Major depressive disorder, recurrent, unspecified: Secondary | ICD-10-CM | POA: Diagnosis present

## 2016-01-23 DIAGNOSIS — N183 Chronic kidney disease, stage 3 (moderate): Secondary | ICD-10-CM | POA: Diagnosis present

## 2016-01-23 DIAGNOSIS — Z23 Encounter for immunization: Secondary | ICD-10-CM | POA: Diagnosis not present

## 2016-01-23 DIAGNOSIS — Y92121 Bathroom in nursing home as the place of occurrence of the external cause: Secondary | ICD-10-CM

## 2016-01-23 DIAGNOSIS — K2970 Gastritis, unspecified, without bleeding: Secondary | ICD-10-CM | POA: Diagnosis present

## 2016-01-23 DIAGNOSIS — E0590 Thyrotoxicosis, unspecified without thyrotoxic crisis or storm: Secondary | ICD-10-CM | POA: Diagnosis present

## 2016-01-23 DIAGNOSIS — I130 Hypertensive heart and chronic kidney disease with heart failure and stage 1 through stage 4 chronic kidney disease, or unspecified chronic kidney disease: Principal | ICD-10-CM | POA: Diagnosis present

## 2016-01-23 DIAGNOSIS — R933 Abnormal findings on diagnostic imaging of other parts of digestive tract: Secondary | ICD-10-CM

## 2016-01-23 DIAGNOSIS — Z96641 Presence of right artificial hip joint: Secondary | ICD-10-CM | POA: Diagnosis present

## 2016-01-23 DIAGNOSIS — I952 Hypotension due to drugs: Secondary | ICD-10-CM

## 2016-01-23 DIAGNOSIS — R197 Diarrhea, unspecified: Secondary | ICD-10-CM

## 2016-01-23 DIAGNOSIS — F419 Anxiety disorder, unspecified: Secondary | ICD-10-CM | POA: Diagnosis present

## 2016-01-23 DIAGNOSIS — I959 Hypotension, unspecified: Secondary | ICD-10-CM | POA: Diagnosis present

## 2016-01-23 DIAGNOSIS — K5790 Diverticulosis of intestine, part unspecified, without perforation or abscess without bleeding: Secondary | ICD-10-CM | POA: Diagnosis present

## 2016-01-23 DIAGNOSIS — Z6820 Body mass index (BMI) 20.0-20.9, adult: Secondary | ICD-10-CM

## 2016-01-23 DIAGNOSIS — K922 Gastrointestinal hemorrhage, unspecified: Secondary | ICD-10-CM | POA: Diagnosis not present

## 2016-01-23 DIAGNOSIS — S0990XA Unspecified injury of head, initial encounter: Secondary | ICD-10-CM | POA: Diagnosis not present

## 2016-01-23 LAB — CBC WITH DIFFERENTIAL/PLATELET
BASOS PCT: 0 %
Basophils Absolute: 0 10*3/uL (ref 0.0–0.1)
EOS ABS: 0.3 10*3/uL (ref 0.0–0.7)
Eosinophils Relative: 3 %
HCT: 28.6 % — ABNORMAL LOW (ref 36.0–46.0)
Hemoglobin: 8.6 g/dL — ABNORMAL LOW (ref 12.0–15.0)
Lymphocytes Relative: 13 %
Lymphs Abs: 1.2 10*3/uL (ref 0.7–4.0)
MCH: 29.9 pg (ref 26.0–34.0)
MCHC: 30.1 g/dL (ref 30.0–36.0)
MCV: 99.3 fL (ref 78.0–100.0)
MONO ABS: 0.6 10*3/uL (ref 0.1–1.0)
MONOS PCT: 6 %
NEUTROS PCT: 78 %
Neutro Abs: 7 10*3/uL (ref 1.7–7.7)
Platelets: 177 10*3/uL (ref 150–400)
RBC: 2.88 MIL/uL — ABNORMAL LOW (ref 3.87–5.11)
RDW: 17.5 % — AB (ref 11.5–15.5)
WBC: 9 10*3/uL (ref 4.0–10.5)

## 2016-01-23 LAB — HEPATIC FUNCTION PANEL
ALBUMIN: 1.7 g/dL — AB (ref 3.5–5.0)
ALK PHOS: 94 U/L (ref 38–126)
ALT: 10 U/L — ABNORMAL LOW (ref 14–54)
AST: 12 U/L — ABNORMAL LOW (ref 15–41)
BILIRUBIN INDIRECT: 0.3 mg/dL (ref 0.3–0.9)
BILIRUBIN TOTAL: 0.4 mg/dL (ref 0.3–1.2)
Bilirubin, Direct: 0.1 mg/dL (ref 0.1–0.5)
Total Protein: 4.7 g/dL — ABNORMAL LOW (ref 6.5–8.1)

## 2016-01-23 LAB — HEMOGLOBIN AND HEMATOCRIT, BLOOD
HCT: 29.5 % — ABNORMAL LOW (ref 36.0–46.0)
HCT: 32.9 % — ABNORMAL LOW (ref 36.0–46.0)
HCT: 32.8 % — ABNORMAL LOW (ref 36.0–46.0)
HEMOGLOBIN: 8.9 g/dL — AB (ref 12.0–15.0)
HEMOGLOBIN: 10 g/dL — AB (ref 12.0–15.0)
Hemoglobin: 9.9 g/dL — ABNORMAL LOW (ref 12.0–15.0)

## 2016-01-23 LAB — MRSA PCR SCREENING: MRSA BY PCR: POSITIVE — AB

## 2016-01-23 LAB — LACTIC ACID, PLASMA
LACTIC ACID, VENOUS: 0.9 mmol/L (ref 0.5–1.9)
LACTIC ACID, VENOUS: 1.5 mmol/L (ref 0.5–1.9)

## 2016-01-23 LAB — I-STAT CHEM 8, ED
BUN: 12 mg/dL (ref 6–20)
CALCIUM ION: 1.18 mmol/L (ref 1.15–1.40)
CHLORIDE: 101 mmol/L (ref 101–111)
Creatinine, Ser: 1 mg/dL (ref 0.44–1.00)
GLUCOSE: 97 mg/dL (ref 65–99)
HCT: 25 % — ABNORMAL LOW (ref 36.0–46.0)
Hemoglobin: 8.5 g/dL — ABNORMAL LOW (ref 12.0–15.0)
Potassium: 4.1 mmol/L (ref 3.5–5.1)
Sodium: 142 mmol/L (ref 135–145)
TCO2: 30 mmol/L (ref 0–100)

## 2016-01-23 LAB — BASIC METABOLIC PANEL
Anion gap: 6 (ref 5–15)
BUN: 11 mg/dL (ref 6–20)
CALCIUM: 7.7 mg/dL — AB (ref 8.9–10.3)
CO2: 27 mmol/L (ref 22–32)
CREATININE: 0.91 mg/dL (ref 0.44–1.00)
Chloride: 108 mmol/L (ref 101–111)
GFR, EST NON AFRICAN AMERICAN: 60 mL/min — AB (ref >60)
Glucose, Bld: 87 mg/dL (ref 65–99)
Potassium: 4.1 mmol/L (ref 3.5–5.1)
SODIUM: 141 mmol/L (ref 135–145)

## 2016-01-23 LAB — URINALYSIS, ROUTINE W REFLEX MICROSCOPIC
BILIRUBIN URINE: NEGATIVE
Glucose, UA: NEGATIVE mg/dL
HGB URINE DIPSTICK: NEGATIVE
Ketones, ur: NEGATIVE mg/dL
Leukocytes, UA: NEGATIVE
Nitrite: NEGATIVE
Protein, ur: NEGATIVE mg/dL
SPECIFIC GRAVITY, URINE: 1.009 (ref 1.005–1.030)
pH: 6 (ref 5.0–8.0)

## 2016-01-23 LAB — PROCALCITONIN: Procalcitonin: <0.1 ng/mL

## 2016-01-23 LAB — POC OCCULT BLOOD, ED: FECAL OCCULT BLD: POSITIVE — AB

## 2016-01-23 LAB — TYPE AND SCREEN
ABO/RH(D): O POS
ANTIBODY SCREEN: NEGATIVE

## 2016-01-23 LAB — TSH: TSH: 3.459 u[IU]/mL (ref 0.350–4.500)

## 2016-01-23 LAB — MAGNESIUM: Magnesium: 1.8 mg/dL (ref 1.7–2.4)

## 2016-01-23 LAB — PHOSPHORUS: Phosphorus: 3.4 mg/dL (ref 2.5–4.6)

## 2016-01-23 LAB — TROPONIN I

## 2016-01-23 MED ORDER — METOPROLOL TARTRATE 5 MG/5ML IV SOLN
2.5000 mg | Freq: Once | INTRAVENOUS | Status: AC
  Administered 2016-01-23: 2.5 mg via INTRAVENOUS

## 2016-01-23 MED ORDER — ONDANSETRON HCL 4 MG/2ML IJ SOLN: 4.0000 mg | Freq: Four times a day (QID) | INTRAMUSCULAR | Status: DC | PRN

## 2016-01-23 MED ORDER — DIGOXIN 125 MCG PO TABS
0.1250 mg | ORAL_TABLET | Freq: Every day | ORAL | Status: DC
  Administered 2016-01-24 – 2016-01-28 (×6): 0.125 mg via ORAL
  Filled 2016-01-23 (×6): qty 1

## 2016-01-23 MED ORDER — CHLORHEXIDINE GLUCONATE CLOTH 2 % EX PADS
6.0000 | MEDICATED_PAD | Freq: Every day | CUTANEOUS | Status: AC
  Administered 2016-01-24 – 2016-01-28 (×5): 6 via TOPICAL

## 2016-01-23 MED ORDER — LIDOCAINE-EPINEPHRINE 1 %-1:100000 IJ SOLN: 20.0000 mL | Freq: Once | INTRAMUSCULAR | Status: DC

## 2016-01-23 MED ORDER — MUPIROCIN 2 % EX OINT
1.0000 "application " | TOPICAL_OINTMENT | Freq: Two times a day (BID) | CUTANEOUS | Status: DC
  Administered 2016-01-23 – 2016-01-28 (×9): 1 via NASAL
  Filled 2016-01-23: qty 22

## 2016-01-23 MED ORDER — LEVALBUTEROL HCL 1.25 MG/0.5ML IN NEBU
1.2500 mg | INHALATION_SOLUTION | Freq: Four times a day (QID) | RESPIRATORY_TRACT | Status: DC
  Administered 2016-01-23 (×2): 1.25 mg via RESPIRATORY_TRACT
  Filled 2016-01-23: qty 0.5

## 2016-01-23 MED ORDER — TRAZODONE HCL 50 MG PO TABS: 50.0000 mg | ORAL_TABLET | Freq: Every day | ORAL | Status: DC

## 2016-01-23 MED ORDER — ONDANSETRON HCL 4 MG PO TABS: 4.0000 mg | ORAL_TABLET | Freq: Four times a day (QID) | ORAL | Status: DC | PRN

## 2016-01-23 MED ORDER — LEVALBUTEROL HCL 1.25 MG/0.5ML IN NEBU
1.2500 mg | INHALATION_SOLUTION | Freq: Four times a day (QID) | RESPIRATORY_TRACT | Status: DC
  Filled 2016-01-23 (×2): qty 0.5

## 2016-01-23 MED ORDER — HYDROCODONE-ACETAMINOPHEN 7.5-325 MG PO TABS
1.0000 | ORAL_TABLET | Freq: Three times a day (TID) | ORAL | Status: DC | PRN
Start: 2016-01-23 — End: 2016-01-23

## 2016-01-23 MED ORDER — BUPROPION HCL ER (XL) 150 MG PO TB24: 150.0000 mg | ORAL_TABLET | Freq: Every day | ORAL | Status: DC

## 2016-01-23 MED ORDER — GABAPENTIN 600 MG PO TABS: 600.0000 mg | ORAL_TABLET | Freq: Three times a day (TID) | ORAL | Status: DC

## 2016-01-23 MED ORDER — SODIUM CHLORIDE 0.9 % IV SOLN
INTRAVENOUS | Status: DC
Start: 2016-01-23 — End: 2016-01-23
  Administered 2016-01-23: 10:00:00 via INTRAVENOUS

## 2016-01-23 MED ORDER — BISACODYL 10 MG RE SUPP: 10.0000 mg | RECTAL | Status: DC | PRN

## 2016-01-23 MED ORDER — DOCUSATE SODIUM 100 MG PO CAPS: 100.0000 mg | ORAL_CAPSULE | Freq: Two times a day (BID) | ORAL | Status: DC | PRN

## 2016-01-23 MED ORDER — IPRATROPIUM-ALBUTEROL 0.5-2.5 (3) MG/3ML IN SOLN
3.0000 mL | Freq: Four times a day (QID) | RESPIRATORY_TRACT | Status: AC | PRN
  Administered 2016-01-24: 3 mL via RESPIRATORY_TRACT
  Filled 2016-01-23: qty 3

## 2016-01-23 MED ORDER — METOPROLOL TARTRATE 5 MG/5ML IV SOLN
INTRAVENOUS | Status: AC
  Filled 2016-01-23: qty 5

## 2016-01-23 MED ORDER — LEVALBUTEROL HCL 1.25 MG/0.5ML IN NEBU: 1.2500 mg | INHALATION_SOLUTION | Freq: Four times a day (QID) | RESPIRATORY_TRACT | Status: DC

## 2016-01-23 MED ORDER — IPRATROPIUM BROMIDE 0.02 % IN SOLN
0.5000 mg | Freq: Three times a day (TID) | RESPIRATORY_TRACT | Status: DC
  Administered 2016-01-24: 0.5 mg via RESPIRATORY_TRACT
  Filled 2016-01-23: qty 2.5

## 2016-01-23 MED ORDER — UMECLIDINIUM BROMIDE 62.5 MCG/INH IN AEPB: 1.0000 | INHALATION_SPRAY | Freq: Every day | RESPIRATORY_TRACT | Status: DC

## 2016-01-23 MED ORDER — SODIUM CHLORIDE 0.9 % IV BOLUS (SEPSIS)
1000.0000 mL | Freq: Once | INTRAVENOUS | Status: AC
  Administered 2016-01-23: 1000 mL via INTRAVENOUS

## 2016-01-23 MED ORDER — METHIMAZOLE 5 MG PO TABS: 5.0000 mg | ORAL_TABLET | Freq: Every day | ORAL | Status: DC

## 2016-01-23 MED ORDER — SODIUM CHLORIDE 0.9% FLUSH
3.0000 mL | Freq: Two times a day (BID) | INTRAVENOUS | Status: DC
  Administered 2016-01-23 – 2016-01-27 (×9): 3 mL via INTRAVENOUS

## 2016-01-23 MED ORDER — IPRATROPIUM BROMIDE 0.02 % IN SOLN
0.5000 mg | Freq: Four times a day (QID) | RESPIRATORY_TRACT | Status: DC
  Administered 2016-01-23 (×2): 0.5 mg via RESPIRATORY_TRACT
  Filled 2016-01-23 (×2): qty 2.5

## 2016-01-23 MED ORDER — PANTOPRAZOLE SODIUM 40 MG IV SOLR
40.0000 mg | Freq: Two times a day (BID) | INTRAVENOUS | Status: DC
  Administered 2016-01-23 – 2016-01-24 (×3): 40 mg via INTRAVENOUS
  Filled 2016-01-23 (×3): qty 40

## 2016-01-23 MED ORDER — ACETAMINOPHEN 500 MG PO TABS
1000.0000 mg | ORAL_TABLET | Freq: Once | ORAL | Status: AC
  Administered 2016-01-23: 1000 mg via ORAL
  Filled 2016-01-23: qty 2

## 2016-01-23 MED ORDER — FUROSEMIDE 10 MG/ML IJ SOLN
20.0000 mg | Freq: Two times a day (BID) | INTRAMUSCULAR | Status: DC
  Administered 2016-01-24 – 2016-01-25 (×3): 20 mg via INTRAVENOUS
  Filled 2016-01-23 (×4): qty 2

## 2016-01-23 MED ORDER — LIDOCAINE-EPINEPHRINE (PF) 1 %-1:200000 IJ SOLN
30.0000 mL | Freq: Once | INTRAMUSCULAR | Status: AC
  Administered 2016-01-23: 30 mL via INTRADERMAL
  Filled 2016-01-23: qty 30

## 2016-01-23 MED ORDER — DIGOXIN 0.25 MG/ML IJ SOLN
0.1250 mg | Freq: Once | INTRAMUSCULAR | Status: AC
  Administered 2016-01-23: 0.125 mg via INTRAVENOUS
  Filled 2016-01-23: qty 2

## 2016-01-23 MED ORDER — ACETAMINOPHEN 325 MG PO TABS
650.0000 mg | ORAL_TABLET | Freq: Four times a day (QID) | ORAL | Status: DC | PRN
  Administered 2016-01-23 – 2016-01-24 (×3): 650 mg via ORAL
  Filled 2016-01-23 (×3): qty 2

## 2016-01-23 MED ORDER — FERROUS SULFATE 325 (65 FE) MG PO TABS: 325.0000 mg | ORAL_TABLET | Freq: Two times a day (BID) | ORAL | Status: DC

## 2016-01-23 MED ORDER — FUROSEMIDE 10 MG/ML IJ SOLN
20.0000 mg | Freq: Once | INTRAMUSCULAR | Status: AC
Start: 2016-01-23 — End: 2016-01-23
  Administered 2016-01-23: 20 mg via INTRAVENOUS
  Filled 2016-01-23: qty 2

## 2016-01-23 MED ORDER — ALPRAZOLAM 0.5 MG PO TABS: 0.5000 mg | ORAL_TABLET | Freq: Two times a day (BID) | ORAL | Status: DC | PRN

## 2016-01-23 MED ORDER — IPRATROPIUM-ALBUTEROL 0.5-2.5 (3) MG/3ML IN SOLN
3.0000 mL | RESPIRATORY_TRACT | Status: AC
  Administered 2016-01-23: 3 mL via RESPIRATORY_TRACT
  Filled 2016-01-23: qty 3

## 2016-01-23 MED ORDER — MAGNESIUM SULFATE 2 GM/50ML IV SOLN
2.0000 g | Freq: Once | INTRAVENOUS | Status: AC
  Administered 2016-01-23: 2 g via INTRAVENOUS
  Filled 2016-01-23: qty 50

## 2016-01-23 MED ORDER — LEVALBUTEROL HCL 1.25 MG/0.5ML IN NEBU
1.2500 mg | INHALATION_SOLUTION | Freq: Three times a day (TID) | RESPIRATORY_TRACT | Status: DC
  Administered 2016-01-24: 1.25 mg via RESPIRATORY_TRACT
  Filled 2016-01-23: qty 0.5

## 2016-01-23 MED ORDER — TETANUS-DIPHTH-ACELL PERTUSSIS 5-2.5-18.5 LF-MCG/0.5 IM SUSP
0.5000 mL | Freq: Once | INTRAMUSCULAR | Status: AC
  Administered 2016-01-23: 0.5 mL via INTRAMUSCULAR
  Filled 2016-01-23: qty 0.5

## 2016-01-23 NOTE — ED Notes (Signed)
Pt noted to be hypotensive, MD aware, bolus hung. Pt sleeping sounding

## 2016-01-23 NOTE — ED Notes (Signed)
HR increased to 140s. EKG performed. Pt asymptomatic. Dr. Olevia Bowens notified and new orders received.

## 2016-01-23 NOTE — Consult Note (Signed)
Cardiology Consultation   Patient ID: JAZZLYNN RAWE; [id number]; 1940/05/23   Admit date: 01/23/2016 Date of Consult: 01/23/2016  Referring MD:  Dr. Olevia Bowens  Primary Care Provider: [name], MD  Cardiologist: Dr. Daneen Schick    Reason for Consultation: tachycardia   History of Present Illness: Dawn [name] is a 76 y.o. female with a hx of with a hx of COPD, hyperthyroidism.  She was noted to have SVT during an admission for urosepsis in 11/2015.  She had follow up with Dr. Daneen Schick who ordered an event monitor.  The monitor is not completed yet but there are notes in the chart that an episode of SVT was noted 12/22.  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  No strips are available for review in her chart.  Medical Rx was continued with beta-blocker therapy. She was then admitted 1/1-1/5 with R hip fracture after suffering a fall.  She underwent hemiarthroplasty and was DC'd to [facility] for rehab.  She suffered a fall today at the SNF and struck her head on the toilet.  Admission notes have been reviewed. She had scalp lacerations repaired in the ED.  Her HR initially was in the 30s.  But, she was then noted to by hypotensive with BP 73/45 and tachycardic with HR 103.  Stool was noted to be Heme +.   Head CT was neg for acute hemorrhage.  CXR demonstrates increased interstitial markings concerning for edema and small bilateral effusions.    Her HR has reportedly been as high as 147 and she was given IV Lopressor to control it.  Cardiology is asked to evaluate.    Her current HR is 142.  She is asymptomatic with this.  BP is in the 15V systolic. She notes shortness of breath, LE edema and a non-productive cough.  She denies chest pain.  She does not think she passed out at the SNF.  She remembers falling and remembers hitting her head.  She thinks she tripped on something.  She denies PND.     Prior Cardiac Studies: Echo  1/41/8 Mod LVH, EF 50-55, Gr 1 DD, mild AI, mild LAE, mild RAE, mod TR, PASP 64 mmHg  Echo 12/02/15 Mild LVH, EF 50-55, no RWMA, Gr 2 DD, mild to mod AI, mild MR, mod LAE, mild RAE, PASP 50 mmHg  Carotid US 12/01/15 Bilateral ICA 1-39%   Past Medical History:  Diagnosis Date  . COPD (chronic obstructive pulmonary disease) ([place])   . History of anxiety   . History of depression   . Spinal stenosis   . UTI (urinary tract infection)     Past Surgical History:  Procedure Laterality Date  . FLEXIBLE SIGMOIDOSCOPY Left 11/18/2015   Procedure: FLEXIBLE SIGMOIDOSCOPY;  Surgeon: [name], MD;  Location: [place];  Service: Endoscopy;  Laterality: Left;  . FLEXIBLE SIGMOIDOSCOPY N/A 11/20/2015   Procedure: FLEXIBLE SIGMOIDOSCOPY;  Surgeon: [name], MD;  Location: [facility] ENDOSCOPY;  Service: Endoscopy;  Laterality: N/A;  . HIP ARTHROPLASTY Right 01/12/2016   Procedure: ARTHROPLASTY BIPOLAR HIP (HEMIARTHROPLASTY);  Surgeon: [name], MD;  Location: [facility];  Service: Orthopedics;  Laterality: Right;      Home Meds: Prior to Admission medications   Medication Sig Start Date End Date Taking? Authorizing Provider  ALPRAZolam Duanne Moron) 0.5 MG tablet Take 1 tablet (0.5 mg total) by mouth 2 (two) times daily as needed for anxiety. 01/21/16  Yes Lauree Chandler, NP  aspirin EC 325 MG EC tablet Take 1 tablet (325 mg total) by mouth 2 (two) times daily. Take for 4 weeks. 01/14/16 02/13/16 Yes Matthew Babish, PA-C  bisacodyl (DULCOLAX) 10 MG suppository Place 10 mg rectally as needed for moderate constipation.   Yes Historical Provider, MD  buPROPion (WELLBUTRIN XL) 150 MG 24 hr tablet Take 150 mg by mouth daily.   Yes Historical Provider, MD  docusate sodium (COLACE) 100 MG capsule Take 1 capsule (100 mg total) by mouth 2 (two) times daily. 01/15/16  Yes Donne Hazel, MD  ferrous sulfate 325 (65 FE) MG tablet Take 325 mg by mouth 2 (two) times daily after a meal.   Yes Historical Provider, MD   gabapentin (NEURONTIN) 600 MG tablet Take 600 mg by mouth 3 (three) times daily.   Yes Historical Provider, MD  HYDROcodone-acetaminophen (NORCO) 7.5-325 MG tablet Take 1 tablet by mouth every 6 (six) hours as needed for moderate pain.   Yes Historical Provider, MD  INCRUSE ELLIPTA 62.5 MCG/INH AEPB Inhale 1 puff into the lungs daily. 10/19/15  Yes Historical Provider, MD  loperamide (IMODIUM) 2 MG capsule Take 4 mg by mouth as needed for diarrhea or loose stools.   Yes Historical Provider, MD  magnesium hydroxide (MILK OF MAGNESIA) 400 MG/5ML suspension Take 30 mLs by mouth daily as needed for mild constipation.   Yes Historical Provider, MD  methimazole (TAPAZOLE) 5 MG tablet Take 5 mg by mouth daily.   Yes Historical Provider, MD  metoprolol tartrate (LOPRESSOR) 25 MG tablet Take 0.5 tablets (12.5 mg total) by mouth 2 (two) times daily. 01/15/16  Yes Donne Hazel, MD  Sodium Phosphates (RA SALINE ENEMA) 19-7 GM/118ML ENEM Place 1 each rectally as needed (for constipation).   Yes Historical Provider, MD  traZODone (DESYREL) 50 MG tablet Take 50 mg by mouth at bedtime.   Yes Historical Provider, MD  UNABLE TO FIND Take 120 mLs by mouth 2 (two) times daily. Med Name: Med Pass   Yes Historical Provider, MD    Current Medications: . furosemide  20 mg Intravenous Once  . ipratropium  0.5 mg Nebulization Q6H  . levalbuterol  1.25 mg Nebulization Q6H  . metoprolol      . pantoprazole  40 mg Intravenous Q12H  . sodium chloride flush  3 mL Intravenous Q12H     Allergies:   No Known Allergies  Social History:   The patient  reports that she has quit smoking. She has never used smokeless tobacco. She reports that she does not drink alcohol or use drugs.    Family History:   The patient's family history includes Diabetes in her father; Heart attack in her mother.   ROS:  Please see the history of present illness.  ROS All other ROS reviewed and negative.      Vital Signs: Blood pressure  103/71, pulse (!) 142, temperature 98.7 F (37.1 C), temperature source Oral, resp. rate (!) 24, SpO2 94 %.   PHYSICAL EXAM: General:  Well nourished, well developed, in no acute distress HEENT: normal Lymph:   Neck: I cannot appreciate JVD Endocrine:  No thryomegaly Cardiac:  Rapid regular rhythm, no obvious murmur. Lungs:  clear to auscultation bilaterally, no wheezing, rhonchi or rales  Abd: soft, nontender Ext: 1+ bilateral LE edema Musculoskeletal:  No deformities  Skin: warm and dry  Neuro:  CNs 2-12 intact, no focal abnormalities noted Psych:  Normal affect    EKG:  NSR, HR 86, rightward axis,  PACs, QTc 463 ms  Labs:  Recent Labs  01/23/16 0846 01/23/16 1425  TROPONINI <0.03 <0.03   Lab Results  Component Value Date   WBC 9.0 01/23/2016   HGB 9.9 (L) 01/23/2016   HCT 32.9 (L) 01/23/2016   MCV 99.3 01/23/2016   PLT 177 01/23/2016    Recent Labs Lab 01/23/16 0516 01/23/16 0846  NA 141  --   K 4.1  --   CL 108  --   CO2 27  --   BUN 11  --   CREATININE 0.91  --   CALCIUM 7.7*  --   PROT  --  4.7*  BILITOT  --  0.4  ALKPHOS  --  94  ALT  --  10*  AST  --  12*  GLUCOSE 87  --    Lab Results  Component Value Date   CHOL 107 12/01/2015   HDL 27 (L) 12/01/2015   LDLCALC 59 12/01/2015   TRIG 107 12/01/2015   No results found for: DDIMER  Radiology/Studies:   Dg Chest 2 View   Result Date: 01/23/2016 IMPRESSION: Cardiac enlargement with interstitial pattern possibly representing edema. Small bilateral pleural effusions. Emphysematous changes. Electronically Signed   By: Lucienne Capers M.D.   On: 01/23/2016 06:21    Ct Head Wo Contrast   Result Date: 01/23/2016 IMPRESSION: No acute intracranial abnormalities. Chronic atrophy and small vessel ischemic changes. 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 Consider follow-up with elective CTA or MRI to confirm. No acute hemorrhage. Degenerative changes throughout the cervical spine. Reversal of cervical  lordosis is unchanged since prior study and likely degenerative. No acute displaced fractures identified Electronically Signed   By: Lucienne Capers M.D.   On: 01/23/2016 04:53    Ct Cervical Spine Wo Contrast   Result Date: 01/23/2016 IMPRESSION: No acute intracranial abnormalities. Chronic atrophy and small vessel ischemic changes. 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 Consider follow-up with elective CTA or MRI to confirm. No acute hemorrhage. Degenerative changes throughout the cervical spine. Reversal of cervical lordosis is unchanged since prior study and likely degenerative. No acute displaced fractures identified Electronically Signed   By: Lucienne Capers M.D.   On: 01/23/2016 04:53    Dg Chest Port 1 View  Result Date: 01/23/2016 IMPRESSION: Stable cardiomegaly with mildly progressive pulmonary vascular congestion and stable interstitial pulmonary edema. Electronically Signed   By: Claudie Revering M.D.   On: 01/23/2016 13:45    PROBLEM LIST:  Principal Problem:   Fall Active Problems:   SVT (supraventricular tachycardia) (H[facility])   Acute diastolic CHF (congestive heart failure) (HCC)   Anxiety   Chronic pain syndrome   COPD (chronic obstructive pulmonary disease) (H[facility])   Hyperthyroidism   Hypotension   Depression   Upper GI bleed   Blood loss anemia   Heme + stool   Abnormal CT scan, colon    ASSESSMENT AND PLAN:  1. SVT - Her HR is sustained at 140s (reportedly HR was in the 30s at one point but I cannot find documentation of this).  She is asymptomatic with tachycardia. Her BP is too soft to push beta-blocker or calcium channel blocker therapy.  She required Digoxin during last hospitalization, but this was DC'd at follow up (normal ejection fraction).  Her recent fall, anemia and acute illness may be impacting her HR some as well, but this appears to be a primary arrhythmia.  Her Thyroid is well managed with recent TSH 3.459.  Question if this is  AFlutter vs ATach.  Will get ECG.   She will also need to see EP tomorrow to decide on further rhythm management.    -  Obtain ECG - decide on rhythm management based upon ECG findings  -  EP will need to see tomorrow.   2. Acute diastolic CHF - She has a non-productive cough, LE edema and CHF on her CXR.  Likely worsened by elevated HR.    -  Will give Lasix 20 mg IV x 1.  3. S/p Fall - Head CT neg for acute bleed.  She denies syncope.   4. Hypotension - BP was in the 70s.  Now, it is in the 90s.    5. Heme + stool - Per primary service.  Signed, Richardson Dopp, PA-C  01/23/2016 4:10 PM    The patient was seen, examined and discussed with Richardson Dopp, PA-C and I agree with the above.   Dawn [name] is a 76 y.o. female with a hx of with a hx of COPD, hyperthyroidism.  She was noted to have SVT during an admission for urosepsis in 11/2015.  She had follow up with Dr. Daneen Schick who ordered an event monitor.  The monitor is not completed yet but there are notes in the chart that an episode of SVT was noted 12/22.  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  No strips are available for review in her chart.  There are several ECGs with SR or ST with very frequent PACs in a pattern of bigeminy. Medical Rx was continued with beta-blocker therapy. She has been experiencing palpitations at home and Dr Tamala Julian was planning to start a 30 day event monitor.  She was then admitted 1/1-1/5 with R hip fracture after suffering a fall.  She underwent hemiarthroplasty and was DC'd to [facility] for rehab.  She suffered a fall today at the SNF and struck her head on the toilet.  While in the ER she developed narrow complex regular SVT with ventricular rate 140 BPM. She was asymptomatic at the time. When we performed ECG she was back in ST with frequent PACs. Diff dg include PJRT vs atrial flutter. She will be again started on digoxin 0.125 mg po daily. We will give one dose of iv today. EP to see  tomorrow for further recommendations.  She is being evaluated for possible GIB. She is showing signs of fluid overload with LE edema and crackles in the lung bases, I will start lasix 20 mg iv BID.   Ena Dawley 01/23/2016

## 2016-01-23 NOTE — ED Notes (Signed)
Admitting Md at the bedside 

## 2016-01-23 NOTE — ED Triage Notes (Signed)
Patient is here for a fall where she was standing from the toilet and tripped and fell.  Blood noted to posterior head.  Patient A&Ox4 at this time.

## 2016-01-23 NOTE — ED Notes (Signed)
Gastroenterologist at bedside.

## 2016-01-23 NOTE — ED Notes (Signed)
Pt taken to CT.

## 2016-01-23 NOTE — ED Notes (Signed)
Pt. Is lethargic, arouses easily to verbal and tactile stimuli,

## 2016-01-23 NOTE — ED Notes (Signed)
Unable to do neuro check, pt. Is very lethargic

## 2016-01-23 NOTE — Telephone Encounter (Signed)
She needs to wear the monitor for 30 days, not 1-2 weeks. Under the circumstances, the monitor needs to be applied for a cumulative total of 30 days. Let the company know that we never began the initial period due too hospitalization. They need to provide monitor for the period of time ordered by MD.

## 2016-01-23 NOTE — ED Notes (Signed)
Pt. Continues to be sleepy Unable to do a neuro assessment

## 2016-01-23 NOTE — Consult Note (Signed)
Consultation  Referring Provider: [facility] ER Primary Care Physician:  [name], MD Primary Gastroenterologist:  none  Reason for Consultation:   [place]   HPI: Dawn [name] is a 76 y.o. female somewhat complicated recent history. Patient had a fall on January 2 and sustained a right hip fracture area She underwent right hip arthroplasty and was started on aspirin 325 mg by mouth twice a day. She has been in a nursing home for rehabilitation since then. She was noted to have anemia postoperatively, require transfusion and was discharged from the hospital with a hemoglobin of 8.8 last week. Patient fell again today while she was on the commode. She was noted to hit her head on the toilet and sustained a laceration to her posterior scalp. She was somewhat lethargic, brought to the emergency room. She's had a low-grade temp 99 6, and was hypotensive with blood pressure in the 11X systolic. On exam she was noted to have dark stool which was heme positive. She has had CT of her head which does not show any acute change. She remains somewhat lethargic, and is still hypotensive with blood pressure in the high 80s after fluid bolusing. She has had 1 dark stool. Labs on admission showed WBC of 9 hemoglobin 8.6 hematocrit of 28.6 platelets 177, BUN 11 creatinine 0.91. HGb has  remained very stable since arrival to the ER with hemoglobin of 8.5 to 2:45 AM and hemoglobin of 8.9 at 846 this morning. Chest x-ray shows today shows stable cardiomegaly with findings suggestive of mild diffuse pulmonary interstitial edema all left pleural effusion underlying COPD. Patient has been started on IV PPI twice a day. She did not awaken to voice or exam and is unable to participate in history, no family in her room.  In background of her acute presentation she also had hospitalization in November 2017 at which time she had also presented with an ALTERED mental status. 3 weeks prior to that she had an admission for  encephalopathy. Apter in this admission she had diarrhea and fever, CT was done showing wall thickening of the rectosigmoid colon and some adjacent inflammatory changes. GI was consulted and she was seen by Dr. Amedeo Plenty. She had an unusual appearance question of an extraluminal fluid collection versus simply redundant rectosigmoid colon loop. She underwent 2 attempts at colonoscopy, she was noted to have diverticulosis in the left colon and a stricture in the sigmoid colon which was unable to be traversed even with a pediatric scope. She was discharged with plans for further outpatient evaluation with possible pelvic ultrasound on my had no definite diagnosis made.      Past Medical History:  Diagnosis Date  . COPD (chronic obstructive pulmonary disease) ([place])   . History of anxiety   . History of depression   . Spinal stenosis   . UTI (urinary tract infection)     Past Surgical History:  Procedure Laterality Date  . FLEXIBLE SIGMOIDOSCOPY Left 11/18/2015   Procedure: FLEXIBLE SIGMOIDOSCOPY;  Surgeon: [name], MD;  Location: [facility];  Service: Endoscopy;  Laterality: Left;  . FLEXIBLE SIGMOIDOSCOPY N/A 11/20/2015   Procedure: FLEXIBLE SIGMOIDOSCOPY;  Surgeon: [name], MD;  Location: [facility] ENDOSCOPY;  Service: Endoscopy;  Laterality: N/A;  . HIP ARTHROPLASTY Right 01/12/2016   Procedure: ARTHROPLASTY BIPOLAR HIP (HEMIARTHROPLASTY);  Surgeon: [name], MD;  Location: [facility];  Service: Orthopedics;  Laterality: Right;    Prior to Admission medications   Medication Sig Start Date End Date Taking? Authorizing  Provider  ALPRAZolam Duanne Moron) 0.5 MG tablet Take 1 tablet (0.5 mg total) by mouth 2 (two) times daily as needed for anxiety. 01/21/16  Yes Lauree Chandler, NP  aspirin EC 325 MG EC tablet Take 1 tablet (325 mg total) by mouth 2 (two) times daily. Take for 4 weeks. 01/14/16 02/13/16 Yes Matthew Babish, PA-C  bisacodyl (DULCOLAX) 10 MG suppository Place 10 mg rectally as needed for  moderate constipation.   Yes Historical Provider, MD  buPROPion (WELLBUTRIN XL) 150 MG 24 hr tablet Take 150 mg by mouth daily.   Yes Historical Provider, MD  docusate sodium (COLACE) 100 MG capsule Take 1 capsule (100 mg total) by mouth 2 (two) times daily. 01/15/16  Yes Donne Hazel, MD  ferrous sulfate 325 (65 FE) MG tablet Take 325 mg by mouth 2 (two) times daily after a meal.   Yes Historical Provider, MD  gabapentin (NEURONTIN) 600 MG tablet Take 600 mg by mouth 3 (three) times daily.   Yes Historical Provider, MD  HYDROcodone-acetaminophen (NORCO) 7.5-325 MG tablet Take 1 tablet by mouth every 6 (six) hours as needed for moderate pain.   Yes Historical Provider, MD  INCRUSE ELLIPTA 62.5 MCG/INH AEPB Inhale 1 puff into the lungs daily. 10/19/15  Yes Historical Provider, MD  loperamide (IMODIUM) 2 MG capsule Take 4 mg by mouth as needed for diarrhea or loose stools.   Yes Historical Provider, MD  magnesium hydroxide (MILK OF MAGNESIA) 400 MG/5ML suspension Take 30 mLs by mouth daily as needed for mild constipation.   Yes Historical Provider, MD  methimazole (TAPAZOLE) 5 MG tablet Take 5 mg by mouth daily.   Yes Historical Provider, MD  metoprolol tartrate (LOPRESSOR) 25 MG tablet Take 0.5 tablets (12.5 mg total) by mouth 2 (two) times daily. 01/15/16  Yes Donne Hazel, MD  Sodium Phosphates (RA SALINE ENEMA) 19-7 GM/118ML ENEM Place 1 each rectally as needed (for constipation).   Yes Historical Provider, MD  traZODone (DESYREL) 50 MG tablet Take 50 mg by mouth at bedtime.   Yes Historical Provider, MD  UNABLE TO FIND Take 120 mLs by mouth 2 (two) times daily. Med Name: Med Pass   Yes Historical Provider, MD    Current Facility-Administered Medications  Medication Dose Route Frequency Provider Last Rate Last Dose  . 0.9 %  sodium chloride infusion   Intravenous Continuous Brenton Grills, PA-C      . ALPRAZolam Duanne Moron) tablet 0.5 mg  0.5 mg Oral BID PRN Brenton Grills, PA-C      .  bisacodyl (DULCOLAX) suppository 10 mg  10 mg Rectal PRN Brenton Grills, PA-C      . buPROPion (WELLBUTRIN XL) 24 hr tablet 150 mg  150 mg Oral Daily Brenton Grills, PA-C      . docusate sodium (COLACE) capsule 100 mg  100 mg Oral BID PRN Brenton Grills, PA-C      . ferrous sulfate tablet 325 mg  325 mg Oral BID PC Marina S Kyazimova, PA-C      . gabapentin (NEURONTIN) tablet 600 mg  600 mg Oral TID Brenton Grills, PA-C      . HYDROcodone-acetaminophen (NORCO) 7.5-325 MG per tablet 1 tablet  1 tablet Oral Q8H PRN Brenton Grills, PA-C      . methimazole (TAPAZOLE) tablet 5 mg  5 mg Oral Daily Marina S Kyazimova, PA-C      . ondansetron (ZOFRAN) tablet 4 mg  4 mg Oral Q6H PRN  Brenton Grills, PA-C       Or  . ondansetron [facility]) injection 4 mg  4 mg Intravenous Q6H PRN Brenton Grills, PA-C      . pantoprazole (PROTONIX) injection 40 mg  40 mg Intravenous Q12H Deno Etienne, DO   40 mg at 01/23/16 0528  . sodium chloride flush (NS) 0.9 % injection 3 mL  3 mL Intravenous Q12H Brenton Grills, PA-C      . traZODone (DESYREL) tablet 50 mg  50 mg Oral QHS Marina S Kyazimova, PA-C      . umeclidinium bromide (INCRUSE ELLIPTA) 62.5 MCG/INH 1 puff  1 puff Inhalation Daily Brenton Grills, PA-C       Current Outpatient Prescriptions  Medication Sig Dispense Refill  . ALPRAZolam (XANAX) 0.5 MG tablet Take 1 tablet (0.5 mg total) by mouth 2 (two) times daily as needed for anxiety. 60 tablet 0  . aspirin EC 325 MG EC tablet Take 1 tablet (325 mg total) by mouth 2 (two) times daily. Take for 4 weeks. 60 tablet 0  . bisacodyl (DULCOLAX) 10 MG suppository Place 10 mg rectally as needed for moderate constipation.    Marland Kitchen buPROPion (WELLBUTRIN XL) 150 MG 24 hr tablet Take 150 mg by mouth daily.    Marland Kitchen docusate sodium (COLACE) 100 MG capsule Take 1 capsule (100 mg total) by mouth 2 (two) times daily. 10 capsule 0  . ferrous sulfate 325 (65 FE) MG tablet Take 325 mg by mouth 2 (two) times  daily after a meal.    . gabapentin (NEURONTIN) 600 MG tablet Take 600 mg by mouth 3 (three) times daily.    Marland Kitchen HYDROcodone-acetaminophen (NORCO) 7.5-325 MG tablet Take 1 tablet by mouth every 6 (six) hours as needed for moderate pain.    . INCRUSE ELLIPTA 62.5 MCG/INH AEPB Inhale 1 puff into the lungs daily.  3  . loperamide (IMODIUM) 2 MG capsule Take 4 mg by mouth as needed for diarrhea or loose stools.    . magnesium hydroxide (MILK OF MAGNESIA) 400 MG/5ML suspension Take 30 mLs by mouth daily as needed for mild constipation.    . methimazole (TAPAZOLE) 5 MG tablet Take 5 mg by mouth daily.    . metoprolol tartrate (LOPRESSOR) 25 MG tablet Take 0.5 tablets (12.5 mg total) by mouth 2 (two) times daily. 60 tablet 0  . Sodium Phosphates (RA SALINE ENEMA) 19-7 GM/118ML ENEM Place 1 each rectally as needed (for constipation).    . traZODone (DESYREL) 50 MG tablet Take 50 mg by mouth at bedtime.    Marland Kitchen UNABLE TO FIND Take 120 mLs by mouth 2 (two) times daily. Med Name: Med Pass      Allergies as of 01/22/2016  . (No Known Allergies)    Family History  Problem Relation Age of Onset  . Heart attack Mother   . Diabetes Father   . Thyroid disease Neg Hx     Social History   Social History  . Marital status: Divorced    Spouse name: N/A  . Number of children: N/A  . Years of education: N/A   Occupational History  . Not on file.   Social History Main Topics  . Smoking status: Former Research scientist (life sciences)  . Smokeless tobacco: Never Used     Comment: 01/18/16 patient unsure of quit date  . Alcohol use No  . Drug use: No  . Sexual activity: Not Currently   Other Topics Concern  . Not on file  Social History Narrative  . No narrative on file    Review of Systems: Pertinent positive and negative review of systems were noted in the above HPI section.  All other review of systems was otherwise negative.  Physical Exam: Vital signs in last 24 hours: Temp:  [97.7 F (36.5 C)-99.6 F (37.6 C)]  97.7 F (36.5 C) (01/13 0640) Pulse Rate:  [39-139] 139 (01/13 0800) Resp:  [16-25] 22 (01/13 0800) BP: (73-123)/(37-92) 97/48 (01/13 0800) SpO2:  [90 %-100 %] 95 % (01/13 0800)   General:   Alert,  Well-developed, well-nourished, Elderly white female, sleeping optimally, she did not arouse to attempts to speak to her or exam Head:  Normocephalic laceration posterior scalp. Eyes:  Sclera clear, no icterus.   Conjunctiva pink. Ears:  Normal auditory acuity. Nose:  No deformity, discharge,  or lesions. Mouth:  No deformity or lesions.   Neck:  Supple; no masses or thyromegaly. Lungs:  Clear throughout to auscultation.   No wheezes, crackles, or rhonchi. Heart: irr Regular rate and rhythm; no murmurs, clicks, rubs,  or gallops. Abdomen:  Soft,nontender, BS active,nonpalp mass or hsm.   Rectal:  Deferred -documented heme positive with dark stool on rectal per ER M.D. Msk:  Symmetrical without gross deformities. . Pulses:  Normal pulses noted. Extremities:  Without clubbing or edema. Neurologic: Lethargic, nonconversant Skin:  Intact without significant lesions or rashes..   Intake/Output from previous day: 01/12 0701 - 01/13 0700 In: 1000 [IV Piggyback:1000] Out: -  Intake/Output this shift: Total I/O In: 1000 [IV Piggyback:1000] Out: -   Lab Results:  Recent Labs  01/23/16 0245 01/23/16 0516  WBC  --  9.0  HGB 8.5* 8.6*  HCT 25.0* 28.6*  PLT  --  177   BMET  Recent Labs  01/23/16 0245 01/23/16 0516  NA 142 141  K 4.1 4.1  CL 101 108  CO2  --  27  GLUCOSE 97 87  BUN 12 11  CREATININE 1.00 0.91  CALCIUM  --  7.7*   LFT No results for input(s): PROT, ALBUMIN, AST, ALT, ALKPHOS, BILITOT, BILIDIR, IBILI in the last 72 hours. PT/INR No results for input(s): LABPROT, INR in the last 72 hours. Hepatitis Panel No results for input(s): HEPBSAG, HCVAB, HEPAIGM, HEPBIGM in the last 72 hours.   IMPRESSION:  #40  76 year old female brought to the emergency room from  nursing home very early this a.m. after she fell while in the bathroom striking her head on the toilet. CT of the head negative for the acute changes. Suspect concussion, with persistent lethargy #2 hypotension #3 hypoxia-stable on O2/2 L #4 normocytic anemia-stable over the past 6 hours since arrival, and stable since discharge from hospital 1 week ago #5 melena-patient is on twice a day iron, unclear at present whether she's having an active GI bleed or heme positive stool which is very dark secondary to iron. 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 Attempts at colonoscopy November 2017 unsuccessful, question of extrinsic process. She will need further workup #9 chronic kidney disease #10 recent diagnosis of hyperthyroidism   Plan; Will keep nothing by mouth except ice chips today, carefully monitor for evidence of active GI bleeding Serial hemoglobins every 6 hours and transfuse for hemoglobin less than 8 Continue IV PPI twice a  day She will need eventual EGD, tender later today if declares herself to be actively bleeding She will also need CT of the abdomen and pelvis. We will follow with you     Trixy Loyola  01/23/2016, 9:17 AM

## 2016-01-23 NOTE — ED Notes (Signed)
Pt with second dark colored stool

## 2016-01-23 NOTE — NC FL2 (Signed)
[place] LEVEL OF CARE SCREENING TOOL     IDENTIFICATION  Patient Name: Dawn [name] Birthdate: [birth year] Sex: female Admission Date (Current Location): 01/23/2016  [facility] and [place] Number:  Herbalist and Address:  [facility], [place] [address], [place], [place] [address]      Provider Number: [id number]  Attending Physician Name and Address:  Reubin Milan, MD  Relative Name and Phone Number:       Current Level of Care: Hospital Recommended Level of Care: [place] Prior Approval Number:    Date Approved/Denied:   PASRR Number: [id number]  Discharge Plan: SNF    Current Diagnoses: Patient Active Problem List   Diagnosis Date Noted  . Upper GI bleed 01/23/2016  . Fall 01/23/2016  . Blood loss anemia 01/23/2016  . At risk for adverse drug event 01/18/2016  . Subcapital fracture of right hip, closed 01/12/2016  . History of depression 12/12/2015  . Alteration consciousness 11/30/2015  . SVT (supraventricular tachycardia) ([place]) 11/27/2015  . Diarrhea 11/27/2015  . Depression 11/27/2015  . Polyneuropathy ([place]) 11/27/2015  . Renal insufficiency   . Protein-calorie malnutrition, severe 11/18/2015  . Intra-abdominal fluid collection   . Premature atrial contractions   . Functional diarrhea   . Sepsis ([place])   . Hyperthyroidism   . Hypotension   . Septic shock ([place])   . Acute encephalopathy   . Hypokalemia   . Acute delirium 11/10/2015  . Acute kidney injury ([place]) 11/10/2015  . Anemia, iron deficiency 11/10/2015  . Anxiety 11/10/2015  . Chronic pain syndrome 11/10/2015  . Recurrent UTI 11/10/2015  . Acute respiratory failure with hypoxia ([place]) 11/10/2015  . COPD (chronic obstructive pulmonary disease) ([place]) 11/10/2015  . Acute hyperglycemia 11/10/2015  . CKD (chronic kidney disease) stage 3, GFR 30-59 ml/min 11/10/2015  . Narrow complex tachycardia ([place]) 11/10/2015  . HTN  (hypertension) 11/10/2015  . Generalized weakness 11/10/2015  . Anemia   . Delirium   . Hypoxemia   . Tachycardia     Orientation RESPIRATION BLADDER Height & Weight     Self, Time, Situation, Place  Normal Continent Weight:   Height:     BEHAVIORAL SYMPTOMS/MOOD NEUROLOGICAL BOWEL NUTRITION STATUS      Continent Diet (regular, see dc summary)  AMBULATORY STATUS COMMUNICATION OF NEEDS Skin   Extensive Assist Verbally Surgical wounds                       Personal Care Assistance Level of Assistance  Bathing, Feeding, Dressing Bathing Assistance: Limited assistance Feeding assistance: Limited assistance Dressing Assistance: Limited assistance     Functional Limitations Info  Sight, Hearing, Speech Sight Info: Adequate Hearing Info: Adequate Speech Info: Adequate    SPECIAL CARE FACTORS FREQUENCY  PT (By licensed PT), OT (By licensed OT)     PT Frequency: 5x OT Frequency: 5x            Contractures Contractures Info: Not present    Additional Factors Info  Code Status, Allergies Code Status Info: Full Allergies Info: NKA           Current Medications (01/23/2016):  This is the current hospital active medication list Current Facility-Administered Medications  Medication Dose Route Frequency Provider Last Rate Last Dose  . 0.9 %  sodium chloride infusion   Intravenous Continuous Brenton Grills, PA-C      . ALPRAZolam (XANAX) tablet 0.5 mg  0.5 mg Oral  BID PRN Brenton Grills, PA-C      . bisacodyl (DULCOLAX) suppository 10 mg  10 mg Rectal PRN Brenton Grills, PA-C      . buPROPion (WELLBUTRIN XL) 24 hr tablet 150 mg  150 mg Oral Daily Brenton Grills, PA-C      . docusate sodium (COLACE) capsule 100 mg  100 mg Oral BID PRN Brenton Grills, PA-C      . ferrous sulfate tablet 325 mg  325 mg Oral BID PC Marina S Kyazimova, PA-C      . gabapentin (NEURONTIN) tablet 600 mg  600 mg Oral TID Brenton Grills, PA-C      .  HYDROcodone-acetaminophen (NORCO) 7.5-325 MG per tablet 1 tablet  1 tablet Oral Q8H PRN Brenton Grills, PA-C      . methimazole (TAPAZOLE) tablet 5 mg  5 mg Oral Daily Brenton Grills, PA-C      . ondansetron [facility]) tablet 4 mg  4 mg Oral Q6H PRN Brenton Grills, PA-C       Or  . ondansetron [facility]) injection 4 mg  4 mg Intravenous Q6H PRN Brenton Grills, PA-C      . pantoprazole (PROTONIX) injection 40 mg  40 mg Intravenous Q12H Deno Etienne, DO   40 mg at 01/23/16 0528  . sodium chloride flush (NS) 0.9 % injection 3 mL  3 mL Intravenous Q12H Brenton Grills, PA-C      . traZODone (DESYREL) tablet 50 mg  50 mg Oral QHS Marina S Kyazimova, PA-C      . umeclidinium bromide (INCRUSE ELLIPTA) 62.5 MCG/INH 1 puff  1 puff Inhalation Daily Brenton Grills, PA-C       Current Outpatient Prescriptions  Medication Sig Dispense Refill  . ALPRAZolam (XANAX) 0.5 MG tablet Take 1 tablet (0.5 mg total) by mouth 2 (two) times daily as needed for anxiety. 60 tablet 0  . aspirin EC 325 MG EC tablet Take 1 tablet (325 mg total) by mouth 2 (two) times daily. Take for 4 weeks. 60 tablet 0  . bisacodyl (DULCOLAX) 10 MG suppository Place 10 mg rectally as needed for moderate constipation.    Marland Kitchen buPROPion (WELLBUTRIN XL) 150 MG 24 hr tablet Take 150 mg by mouth daily.    Marland Kitchen docusate sodium (COLACE) 100 MG capsule Take 1 capsule (100 mg total) by mouth 2 (two) times daily. 10 capsule 0  . ferrous sulfate 325 (65 FE) MG tablet Take 325 mg by mouth 2 (two) times daily after a meal.    . gabapentin (NEURONTIN) 600 MG tablet Take 600 mg by mouth 3 (three) times daily.    Marland Kitchen HYDROcodone-acetaminophen (NORCO) 7.5-325 MG tablet Take 1 tablet by mouth every 6 (six) hours as needed for moderate pain.    . INCRUSE ELLIPTA 62.5 MCG/INH AEPB Inhale 1 puff into the lungs daily.  3  . loperamide (IMODIUM) 2 MG capsule Take 4 mg by mouth as needed for diarrhea or loose stools.    . magnesium hydroxide (MILK OF  MAGNESIA) 400 MG/5ML suspension Take 30 mLs by mouth daily as needed for mild constipation.    . methimazole (TAPAZOLE) 5 MG tablet Take 5 mg by mouth daily.    . metoprolol tartrate (LOPRESSOR) 25 MG tablet Take 0.5 tablets (12.5 mg total) by mouth 2 (two) times daily. 60 tablet 0  . Sodium Phosphates (RA SALINE ENEMA) 19-7 GM/118ML ENEM Place 1 each rectally as needed (for constipation).    Marland Kitchen  traZODone (DESYREL) 50 MG tablet Take 50 mg by mouth at bedtime.    Marland Kitchen UNABLE TO FIND Take 120 mLs by mouth 2 (two) times daily. Med Name: Med Pass       Discharge Medications: Please see discharge summary for a list of discharge medications.  Relevant Imaging Results:  Relevant Lab Results:   Additional Information SSN:  [id number]  Lilly Cove, LCSW

## 2016-01-23 NOTE — ED Notes (Signed)
Patient's oxygen dropped to 88%. Placed patient on 2L of O2.

## 2016-01-23 NOTE — ED Notes (Signed)
Report called to 3W.

## 2016-01-23 NOTE — ED Notes (Signed)
Pt had a dark bowel movement, occult card completed per Dr.Floyd

## 2016-01-23 NOTE — Clinical Social Work Note (Signed)
Clinical Social Work Assessment  Patient Details  Name: Dawn [name] MRN: [id number] Date of Birth: 1940-09-11  Date of referral:  01/23/16               Reason for consult:  Discharge Planning (Admitted from SNF: [facility])                Permission sought to share information with:  Case Manager, Customer service manager, Family Supports Permission granted to share information::     Name::        Agency::  [facility]: Dawn [name]  Relationship::  Dawn [name], Daughter  Contact Information:     Housing/Transportation Living arrangements for the past 2 months:  [place] of Information:  Medical Team, Tourist information centre manager, Adult Children Patient Interpreter Needed:  None Criminal Activity/Legal Involvement Pertinent to Current Situation/Hospitalization:  No - Comment as needed Significant Relationships:  Adult Children, Other Family Members, Community Support Lives with:  Facility Resident Do you feel safe going back to the place where you live?  Yes Need for family participation in patient care:  Yes (Comment)  Care giving concerns:  No concerns noted by daughter while in ED. Recently placed for rehab from [facility] on 1/5 at [place].  Patient will return under rehab days and then transition to [place], most [facility] at [facility].   Social Worker assessment / plan:  LCSW reviewed patient chart and that patient is from SNF. Recently placed and no barriers with return.  Will return at DC.  Call placed to facility, notified of admission.  Understanding and will accept patient back. Daughter called as she was not in room at time of assessment. Agreeable to admission and return to SNF when medically cleared.  Patient does not require 3 day stay as she is from SNF and currently in 30 days.  LCSW updated FL2.  Unit SW to follow up with disposition of needs.  Employment status:  Retired Forensic scientist:  Medicare PT Recommendations:  Not assessed at this time (will return  to [place], recently placed) Information / Referral to community resources:     Patient/Family's Response to care:  Agreeable to plan  Patient/Family's Understanding of and Emotional Response to Diagnosis, Current Treatment, and Prognosis:  Daughter understanding of treatment plan and observation of head trauma when she fell.    Emotional Assessment Appearance:  Appears stated age Attitude/Demeanor/Rapport:  Unable to Assess Affect (typically observed):  Quiet, Pleasant Orientation:  Oriented to Self, Oriented to Place Alcohol / Substance use:  Not Applicable Psych involvement (Current and /or in the community):  No (Comment)  Discharge Needs  Concerns to be addressed:  No discharge needs identified Readmission within the last 30 days:  Yes Current discharge risk:  None Barriers to Discharge:  No Barriers Identified, Continued Medical Work up   Dawn Cove, LCSW 01/23/2016, 9:22 AM

## 2016-01-23 NOTE — Progress Notes (Signed)
   The patient was seen because of tachycardia in the 140s, hypotension with systolic  palpitations and hypoxia in the low 90s. She was put on NR mask, given two doses of metoprolol 2.5 mg IVP (discontinuation syndrome), magnesium sulfate 2 grams IVP. since the patient had not received her regular dose of oral metoprolol earlier. Per patient, she gets palpitations if she does not get her "heart medicine". After IV metoprolol her pulse decreased to the 120s and her BP and SBP improved to the low 100s. A repeat EKG and portable chest radiograph were ordered. Will also consult cardiology.  CV: S1S2, RRR, no murmurs. Lungs: Bibasilar rales. Abdomen: Soft, NT Exts. 3+ lower extremities edema.  Vent. rate 86 BPM PR interval * ms QRS duration 88 ms QT/QTc 423/463 ms P-R-T axes 65 94 74 Sinus rhythm Supraventricular bigeminy Probable anterior infarct, age indeterminate  ------------------------------------------------------------------- LV EF: 50% -   55%  ------------------------------------------------------------------- Indications:      CHF - 428.0.  ------------------------------------------------------------------- History:   PMH:  Delirium. UTI. SVT.  Dyspnea.  Chronic obstructive pulmonary disease.  Risk factors:  Hypertension.  ------------------------------------------------------------------- Study Conclusions  - Left ventricle: The cavity size was normal. Wall thickness was   increased in a pattern of moderate LVH. Systolic function was   normal. The estimated ejection fraction was in the range of 50%   to 55%. Doppler parameters are consistent with abnormal left   ventricular relaxation (grade 1 diastolic dysfunction). The E/e&'   ratio is <8, suggesting normal LV filling pressure. - Aortic valve: Mildly calcified leaflets. There was mild   regurgitation. - Left atrium: The atrium was mildly dilated. - Right ventricle: The cavity size was mildly dilated. The  moderator band was prominent. Systolic function was normal. - Right atrium: The atrium was mildly dilated. - Tricuspid valve: There was moderate regurgitation. - Pulmonary arteries: PA peak pressure: 64 mm Hg (S). - Inferior vena cava: The vessel was dilated. The respirophasic   diameter changes were blunted (< 50%), consistent with elevated   central venous pressure.  Impressions:  - Compared to a prior echo in 11/2015, the LVEF is stable at   50-55%. The RVSP has increased from 50 mmHg to 64 mmHg  Tennis Must, MD [phone].

## 2016-01-23 NOTE — ED Notes (Signed)
Sats decreased to 85% on room air, pt with dry, nonproductive cough. Pt on 2L [place], sats improved

## 2016-01-23 NOTE — ED Notes (Signed)
In and out catheter performed without difficulty. Significant vaginal prolapse noted. Urine sent to lab.

## 2016-01-23 NOTE — ED Notes (Signed)
Acuity level increased to a level 2 due to pt.s status

## 2016-01-23 NOTE — H&P (Signed)
History and Physical    DAISEE CENTNER ZOX:[id number] DOB: 1940/06/20 DOA: 01/23/2016  PCP: [name], MD   Patient coming from:  SNF  Chief Complaint: Fall with head laceration  HPI: MARGARETTE VANNATTER is a 76 y.o. female with medical history significant of COPD, anemia, HTN, Hyperthyroidism, CKD stage III and hip surgery who presented to the Emergency Department from [place] where she is recovering after her hip surgery. Patient fell when trying to get up off the toilet and hit the back of her head against the toilet.    ED Course: On arrival patient c/o headache, denied neck or back pain. for the relief of her symptoms. Scalp laceration was repaired in the ED  VS revealed significant hypertension-73/45 mmHg and during my examination her blood pressure was 85/56 heart rate was accelerated at 103 but on presentation she was bradycardic with heart rate 39. Blood work revealed hemoglobin 8.5 and last known hemoglobin at time of her discharge after this surgery was 9.9 on January 5. 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  Review of Systems: As per HPI, otherwise 10 point review of systems negative.   Ambulatory Status:  Bedridden and ambulates with assistance  Past Medical History:  Diagnosis Date  . COPD (chronic obstructive pulmonary disease) ([place])   . History of anxiety   . History of depression   . Spinal stenosis   . UTI (urinary tract infection)     Past Surgical History:  Procedure Laterality Date  . FLEXIBLE SIGMOIDOSCOPY Left 11/18/2015   Procedure:  FLEXIBLE SIGMOIDOSCOPY;  Surgeon: [name], MD;  Location: [facility];  Service: Endoscopy;  Laterality: Left;  . FLEXIBLE SIGMOIDOSCOPY N/A 11/20/2015   Procedure: FLEXIBLE SIGMOIDOSCOPY;  Surgeon: [name], MD;  Location: [facility] END[facility]COPY;  Service: Endoscopy;  Laterality: N/A;  . HIP ARTHROPLASTY Right 01/12/2016   Procedure: ARTHROPLASTY BIPOLAR HIP (HEMIARTHROPLASTY);  Surgeon: [name], MD;  Location: [facility];  Service: Orthopedics;  Laterality: Right;    Social History   Social History  . Marital status: Divorced    Spouse name: N/A  . Number of children: N/A  . Years of education: N/A   Occupational History  . Not on file.   Social History Main Topics  . Smoking status: Former Research scientist (life sciences)  . Smokeless tobacco: Never Used     Comment: 01/18/16 patient unsure of quit date  . Alcohol use No  . Drug use: No  . Sexual activity: Not Currently   Other Topics Concern  . Not on file   Social History Narrative  . No narrative on file    No Known Allergies  Family History  Problem Relation Age of Onset  . Heart attack Mother   . Diabetes Father   . Thyroid disease Neg Hx     Prior to Admission medications   Medication Sig Start Date End Date Taking? Authorizing Provider  ALPRAZolam Duanne Moron) 0.5 MG tablet Take 1 tablet (0.5 mg total) by mouth 2 (two) times daily as needed for anxiety. 01/21/16  Yes Lauree Chandler, NP  aspirin EC 325 MG EC tablet Take 1 tablet (325 mg total) by mouth 2 (two) times daily. Take for 4 weeks.  01/14/16 02/13/16 Yes Matthew Babish, PA-C  bisacodyl (DULCOLAX) 10 MG suppository Place 10 mg rectally as needed for moderate constipation.   Yes Historical Provider, MD  buPROPion (WELLBUTRIN XL) 150 MG 24 hr tablet Take 150 mg by mouth daily.   Yes Historical Provider, MD  docusate sodium (COLACE) 100 MG capsule Take 1 capsule (100 mg total) by mouth 2 (two) times daily. 01/15/16  Yes Donne Hazel, MD  ferrous sulfate 325 (65 FE) MG tablet Take 325 mg by  mouth 2 (two) times daily after a meal.   Yes Historical Provider, MD  gabapentin (NEURONTIN) 600 MG tablet Take 600 mg by mouth 3 (three) times daily.   Yes Historical Provider, MD  HYDROcodone-acetaminophen (NORCO) 7.5-325 MG tablet Take 1 tablet by mouth every 6 (six) hours as needed for moderate pain.   Yes Historical Provider, MD  INCRUSE ELLIPTA 62.5 MCG/INH AEPB Inhale 1 puff into the lungs daily. 10/19/15  Yes Historical Provider, MD  loperamide (IMODIUM) 2 MG capsule Take 4 mg by mouth as needed for diarrhea or loose stools.   Yes Historical Provider, MD  magnesium hydroxide (MILK OF MAGNESIA) 400 MG/5ML suspension Take 30 mLs by mouth daily as needed for mild constipation.   Yes Historical Provider, MD  methimazole (TAPAZOLE) 5 MG tablet Take 5 mg by mouth daily.   Yes Historical Provider, MD  metoprolol tartrate (LOPRESSOR) 25 MG tablet Take 0.5 tablets (12.5 mg total) by mouth 2 (two) times daily. 01/15/16  Yes Donne Hazel, MD  Sodium Phosphates (RA SALINE ENEMA) 19-7 GM/118ML ENEM Place 1 each rectally as needed (for constipation).   Yes Historical Provider, MD  traZODone (DESYREL) 50 MG tablet Take 50 mg by mouth at bedtime.   Yes Historical Provider, MD  UNABLE TO FIND Take 120 mLs by mouth 2 (two) times daily. Med Name: Med Pass   Yes Historical Provider, MD    Physical Exam: Vitals:   01/23/16 0630 01/23/16 0640 01/23/16 0645 01/23/16 0700  BP: (!) 76/45  (!) 82/46 (!) 81/56  Pulse: (!) 39   103  Resp: '21  22 21  '$ Temp:  97.7 F (36.5 C)    TempSrc:  Rectal    SpO2: 96%  97% 97%     General: Lethargic, hard to arouse Eyes: unable to assess ENT:  Unable to assess Neck: no lymphoadenopathy, masses or thyromegaly Cardiovascular: RRR, no m/r/g. No JVD, carotid bruits, large pitting lower extremities edema Respiratory: bilaterally diminished breath sounds, no wheezes, rales, rhonchi or cracles. Normal respiratory effort. No accessory muscle use observed Abdomen: soft,  non-tender, non-distended, no organomegaly or masses appreciated. BS present in all quadrants Skin: no rash, ulcers or induration seen on limited exam Musculoskeletal:  no bony abnormality or joint deformities observed Psychiatric: unable to assess       Neurologic: unable to assess  Labs on Admission: I have personally reviewed following labs and imaging studies  CBC, BMP  GFR: Estimated Creatinine Clearance: 49.7 mL/min (by C-G formula based on SCr of 0.91 mg/dL).   Creatinine Clearance: Estimated Creatinine Clearance: 49.7 mL/min (by C-G formula based on SCr of 0.91 mg/dL).    Radiological Exams on Admission: Dg Chest 2 View  Result Date: 01/23/2016 CLINICAL DATA:  Fall tonight. Cough. Low oxygen saturation. Hypotension. Previous smoker. History of COPD. EXAM: CHEST  2 VIEW COMPARISON:  01/12/2016 FINDINGS: Cardiac enlargement with mild pulmonary vascular congestion. Interstitial changes in the lung bases may be due to chronic fibrosis or acute edema.  Small bilateral pleural effusions. No focal consolidation. Emphysematous changes in the lungs. Calcification of the aorta. IMPRESSION: Cardiac enlargement with interstitial pattern possibly representing edema. Small bilateral pleural effusions. Emphysematous changes. Electronically Signed   By: Lucienne Capers M.D.   On: 01/23/2016 06:21   Ct Head Wo Contrast  Result Date: 01/23/2016 CLINICAL DATA:  Patient fell, striking back of head. Laceration to the occipital and right parietal region. Patient is lethargic. EXAM: CT HEAD WITHOUT CONTRAST CT CERVICAL SPINE WITHOUT CONTRAST TECHNIQUE: Multidetector CT imaging of the head and cervical spine was performed following the standard protocol without intravenous contrast. Multiplanar CT image reconstructions of the cervical spine were also generated. COMPARISON:  MRI brain 11/30/2015. CT cervical spine 11/30/2015. CT head 11/30/2015. FINDINGS: CT HEAD FINDINGS Brain: Diffuse cerebral atrophy.  Ventricular dilatation consistent with central atrophy. Low-attenuation changes in the deep white matter consistent with small vessel ischemia. No abnormal extra-axial fluid collections. No mass effect or midline shift. Gray-white matter junctions are distinct. Basal cisterns are not effaced. No acute intracranial hemorrhage. Vascular: Vascular calcifications. The basilar tip appears prominent. This may be due to tortuosity but could indicate an intracranial aneurysm, measuring 6 mm. No evidence of acute hemorrhage. Skull: Normal. Negative for fracture or focal lesion. Sinuses/Orbits: Mucosal thickening in the maxillary antra with partial opacification of the left maxillary antrum, similar to prior study. Mastoid air cells are not opacified. Old nasal bone deformities. Other: Subcutaneous scalp laceration over the posterior occipital region. CT CERVICAL SPINE FINDINGS Alignment: There is reversal of the usual cervical lordosis with slight retrolisthesis of C4 on C5 and C5 on C6. This is likely due to degenerative change. The alignment is unchanged since the prior study. Normal alignment of the facet joints. C1-2 articulation appears intact. Old osseous fragments over the tip of the odontoid process with loss of the anterior odontoid space, likely degenerative. Skull base and vertebrae: No acute fracture. No primary bone lesion or focal pathologic process. Soft tissues and spinal canal: No prevertebral fluid or swelling. No visible canal hematoma. Disc levels: Degenerative changes throughout the cervical spine with narrowed interspaces and endplate hypertrophic changes, most prominent at C3-4, C4-5, C5-6, and C6-7 levels. Prominent degenerative spurring in the facet joints and uncovertebral joints causing neural foraminal bony encroachment at multiple levels. Upper chest: Emphysematous changes and fibrosis in the lung apices. Other: Circumscribed hyperdense cystic structure demonstrated in the soft tissues of the  posterior neck over the C3 spinous process. This likely represents a sebaceous cyst. No change since prior study. IMPRESSION: No acute intracranial abnormalities. Chronic atrophy and small vessel ischemic changes. 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 Consider follow-up with elective CTA or MRI to confirm. No acute hemorrhage. Degenerative changes throughout the cervical spine. Reversal of cervical lordosis is unchanged since prior study and likely degenerative. No acute displaced fractures identified Electronically Signed   By: Lucienne Capers M.D.   On: 01/23/2016 04:53   Ct Cervical Spine Wo Contrast  Result Date: 01/23/2016 CLINICAL DATA:  Patient fell, striking back of head. Laceration to the occipital and right parietal region. Patient is lethargic. EXAM: CT HEAD WITHOUT CONTRAST CT CERVICAL SPINE WITHOUT CONTRAST TECHNIQUE: Multidetector CT imaging of the head and cervical spine was performed following the standard protocol without intravenous contrast. Multiplanar CT image reconstructions of the cervical spine were also generated. COMPARISON:  MRI brain 11/30/2015. CT cervical spine 11/30/2015. CT head 11/30/2015. FINDINGS: CT HEAD FINDINGS Brain: Diffuse cerebral atrophy. Ventricular dilatation consistent with central atrophy. Low-attenuation changes in  the deep white matter consistent with small vessel ischemia. No abnormal extra-axial fluid collections. No mass effect or midline shift. Gray-white matter junctions are distinct. Basal cisterns are not effaced. No acute intracranial hemorrhage. Vascular: Vascular calcifications. The basilar tip appears prominent. This may be due to tortuosity but could indicate an intracranial aneurysm, measuring 6 mm. No evidence of acute hemorrhage. Skull: Normal. Negative for fracture or focal lesion. Sinuses/Orbits: Mucosal thickening in the maxillary antra with partial opacification of the left maxillary antrum, similar to prior study. Mastoid air cells are not  opacified. Old nasal bone deformities. Other: Subcutaneous scalp laceration over the posterior occipital region. CT CERVICAL SPINE FINDINGS Alignment: There is reversal of the usual cervical lordosis with slight retrolisthesis of C4 on C5 and C5 on C6. This is likely due to degenerative change. The alignment is unchanged since the prior study. Normal alignment of the facet joints. C1-2 articulation appears intact. Old osseous fragments over the tip of the odontoid process with loss of the anterior odontoid space, likely degenerative. Skull base and vertebrae: No acute fracture. No primary bone lesion or focal pathologic process. Soft tissues and spinal canal: No prevertebral fluid or swelling. No visible canal hematoma. Disc levels: Degenerative changes throughout the cervical spine with narrowed interspaces and endplate hypertrophic changes, most prominent at C3-4, C4-5, C5-6, and C6-7 levels. Prominent degenerative spurring in the facet joints and uncovertebral joints causing neural foraminal bony encroachment at multiple levels. Upper chest: Emphysematous changes and fibrosis in the lung apices. Other: Circumscribed hyperdense cystic structure demonstrated in the soft tissues of the posterior neck over the C3 spinous process. This likely represents a sebaceous cyst. No change since prior study. IMPRESSION: No acute intracranial abnormalities. Chronic atrophy and small vessel ischemic changes. 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 Consider follow-up with elective CTA or MRI to confirm. No acute hemorrhage. Degenerative changes throughout the cervical spine. Reversal of cervical lordosis is unchanged since prior study and likely degenerative. No acute displaced fractures identified Electronically Signed   By: Lucienne Capers M.D.   On: 01/23/2016 04:53    EKG: Independently reviewed - pending  Assessment/Plan Principal Problem:   Fall Active Problems:   Chronic pain syndrome   COPD (chronic obstructive  pulmonary disease) ([facility])   Hyperthyroidism   Hypotension   Upper GI bleed   Blood loss anemia     Fall - unclear if patient had a syncopal episode vs pure mechanical fall vs opioid overdose Monitor wound healing, provide safety Monitor on telemetry as she has a history of narrow complex tachycardia Last Echo on 01/14/2016 showed normal LV EF 66-44%I grade I diastolic dysfunction, severe pulmonary HTN Serum Mg pending  GI bleeding  Fecal occult blood test was positive in the ED GI was contacted by ED physician Continue to monitor [place], initiate gentle hydration, IV protonix  Exacerbation of chronic anemia associated with GI bleed (blood loss anemia) So far hemoglobin is stable, continue to monitory H&H If patient becomes unstable hemodynamically or Hgb continues to drop,will transfuse  Hypotension in patient with previous history of HTN Continue to cycle cardiac enzymes, monitor on telemetry,  Although her WBC is normal and chest XRay did not reveal any signs of PNA, will check inflammatory markers to r/o infectious etiology Will hold Lopressor  Depression/anxiety Continue Wellbutrin and  Xanax (prn)  Hyperthyroidism  Continue Methimazole  DVT prophylaxis: none Code Status: full Family Communication: none Disposition Plan: stepdown Consults called: GI by ED provider Admission status: inpatient   [facility]  Georjean Mode Pager: [phone] [facility]  If 7PM-7AM, please contact night-coverage [email or web address] Password [email or web address]  01/23/2016, 8:09 AM

## 2016-01-23 NOTE — ED Notes (Signed)
Spoke to [place], pt's daughter, and updated her on plan of care

## 2016-01-23 NOTE — ED Provider Notes (Signed)
[facility] DEPT Provider Note   CSN: [id number] Arrival date & time: 01/23/16  0350  By signing my name below, I, Soijett Blue, attest that this documentation has been prepared under the direction and in the presence of Deno Etienne, DO. Electronically Signed: Soijett Blue, ED Scribe. 01/23/16. 1:24 AM.  History   Chief Complaint Chief Complaint  Patient presents with  . Fall    HPI Dawn [name] is a 76 y.o. female with a PMHx of COPD, who presents to the Emergency Department brought in by EMS from nursing home complaining of a fall onset PTA. Family notes that the pt fell upon getting off the toilet, causing her to trip, fall, and striking her posterior head on the toilet. Family states that the pt is bedridden and ambulates with assistance. Family reports that the pt recently had hip surgery. Family is unsure of the status of the pt tetanus vaccination. Pt is having associated symptoms of wound to posterior head and HA. She hasn't tried any medications for the relief of her symptoms. She denies neck pain, joint pain, color change, and any other symptoms.    The history is provided by the patient and a relative. No language interpreter was used.  Fall  This is a recurrent problem. The current episode started 1 to 2 hours ago. The problem occurs rarely. The problem has not changed since onset.Associated symptoms include headaches. Nothing aggravates the symptoms. Nothing relieves the symptoms. She has tried nothing for the symptoms. The treatment provided no relief.    Past Medical History:  Diagnosis Date  . COPD (chronic obstructive pulmonary disease) ([place])   . History of anxiety   . History of depression   . Spinal stenosis   . UTI (urinary tract infection)     Patient Active Problem List   Diagnosis Date Noted  . Upper GI bleed 01/23/2016  . At risk for adverse drug event 01/18/2016  . Subcapital fracture of right hip, closed 01/12/2016  . History of depression  12/12/2015  . Alteration consciousness 11/30/2015  . SVT (supraventricular tachycardia) ([place]) 11/27/2015  . Diarrhea 11/27/2015  . Depression 11/27/2015  . Polyneuropathy ([place]) 11/27/2015  . Renal insufficiency   . Protein-calorie malnutrition, severe 11/18/2015  . Intra-abdominal fluid collection   . Premature atrial contractions   . Functional diarrhea   . Sepsis ([place])   . Hyperthyroidism   . Hypotension   . Septic shock ([place])   . Acute encephalopathy   . Hypokalemia   . Acute delirium 11/10/2015  . Acute kidney injury ([place]) 11/10/2015  . Anemia, iron deficiency 11/10/2015  . Anxiety 11/10/2015  . Chronic pain syndrome 11/10/2015  . Recurrent UTI 11/10/2015  . Acute respiratory failure with hypoxia ([place]) 11/10/2015  . COPD (chronic obstructive pulmonary disease) ([place]) 11/10/2015  . Acute hyperglycemia 11/10/2015  . CKD (chronic kidney disease) stage 3, GFR 30-59 ml/min 11/10/2015  . Narrow complex tachycardia ([place]) 11/10/2015  . HTN (hypertension) 11/10/2015  . Generalized weakness 11/10/2015  . Anemia   . Delirium   . Hypoxemia   . Tachycardia     Past Surgical History:  Procedure Laterality Date  . FLEXIBLE SIGMOIDOSCOPY Left 11/18/2015   Procedure: FLEXIBLE SIGMOIDOSCOPY;  Surgeon: [name], MD;  Location: [facility];  Service: Endoscopy;  Laterality: Left;  . FLEXIBLE SIGMOIDOSCOPY N/A 11/20/2015   Procedure: FLEXIBLE SIGMOIDOSCOPY;  Surgeon: [name], MD;  Location: [facility] ENDOSCOPY;  Service: Endoscopy;  Laterality: N/A;  . HIP ARTHROPLASTY Right 01/12/2016   Procedure: ARTHROPLASTY  BIPOLAR HIP (HEMIARTHROPLASTY);  Surgeon: [name], MD;  Location: [facility];  Service: Orthopedics;  Laterality: Right;    OB History    No data available       Home Medications    Prior to Admission medications   Medication Sig Start Date End Date Taking? Authorizing Provider  ALPRAZolam Duanne Moron) 0.5 MG tablet Take 1 tablet (0.5 mg total) by mouth 2 (two) times daily as  needed for anxiety. 01/21/16  Yes Lauree Chandler, NP  aspirin EC 325 MG EC tablet Take 1 tablet (325 mg total) by mouth 2 (two) times daily. Take for 4 weeks. 01/14/16 02/13/16 Yes Matthew Babish, PA-C  bisacodyl (DULCOLAX) 10 MG suppository Place 10 mg rectally as needed for moderate constipation.   Yes Historical Provider, MD  buPROPion (WELLBUTRIN XL) 150 MG 24 hr tablet Take 150 mg by mouth daily.   Yes Historical Provider, MD  docusate sodium (COLACE) 100 MG capsule Take 1 capsule (100 mg total) by mouth 2 (two) times daily. 01/15/16  Yes Donne Hazel, MD  ferrous sulfate 325 (65 FE) MG tablet Take 325 mg by mouth 2 (two) times daily after a meal.   Yes Historical Provider, MD  gabapentin (NEURONTIN) 600 MG tablet Take 600 mg by mouth 3 (three) times daily.   Yes Historical Provider, MD  HYDROcodone-acetaminophen (NORCO) 7.5-325 MG tablet Take 1 tablet by mouth every 6 (six) hours as needed for moderate pain.   Yes Historical Provider, MD  INCRUSE ELLIPTA 62.5 MCG/INH AEPB Inhale 1 puff into the lungs daily. 10/19/15  Yes Historical Provider, MD  loperamide (IMODIUM) 2 MG capsule Take 4 mg by mouth as needed for diarrhea or loose stools.   Yes Historical Provider, MD  magnesium hydroxide (MILK OF MAGNESIA) 400 MG/5ML suspension Take 30 mLs by mouth daily as needed for mild constipation.   Yes Historical Provider, MD  methimazole (TAPAZOLE) 5 MG tablet Take 5 mg by mouth daily.   Yes Historical Provider, MD  metoprolol tartrate (LOPRESSOR) 25 MG tablet Take 0.5 tablets (12.5 mg total) by mouth 2 (two) times daily. 01/15/16  Yes Donne Hazel, MD  Sodium Phosphates (RA SALINE ENEMA) 19-7 GM/118ML ENEM Place 1 each rectally as needed (for constipation).   Yes Historical Provider, MD  traZODone (DESYREL) 50 MG tablet Take 50 mg by mouth at bedtime.   Yes Historical Provider, MD  UNABLE TO FIND Take 120 mLs by mouth 2 (two) times daily. Med Name: Med Pass   Yes Historical Provider, MD    Family  History Family History  Problem Relation Age of Onset  . Heart attack Mother   . Diabetes Father   . Thyroid disease Neg Hx     Social History Social History  Substance Use Topics  . Smoking status: Former Research scientist (life sciences)  . Smokeless tobacco: Never Used     Comment: 01/18/16 patient unsure of quit date  . Alcohol use No     Allergies   Patient has no known allergies.   Review of Systems Review of Systems  Musculoskeletal: Negative for arthralgias and neck pain.  Skin: Positive for wound. Negative for color change.  Neurological: Positive for headaches.  All other systems reviewed and are negative.  Physical Exam Updated Vital Signs BP (!) 94/52   Pulse 76   Temp 99.6 F (37.6 C) (Oral)   Resp 24   SpO2 94%   Physical Exam  Constitutional: She is oriented to person, place, and time. She appears well-developed  and well-nourished. No distress.  pallor  HENT:  Head: Normocephalic. Head is with laceration.  1.5 cm laceration to posterior occiput.  Eyes: EOM are normal. Pupils are equal, round, and reactive to light.  Neck: Normal range of motion. Neck supple.  Cardiovascular: Normal rate and regular rhythm.  Exam reveals no gallop and no friction rub.   No murmur heard. Pulmonary/Chest: Effort normal. She has no wheezes. She has no rales. She exhibits no tenderness.  No chest wall tenderness.   Abdominal: Soft. She exhibits no distension. There is no tenderness.  No abdominal tenderness.  Genitourinary:  Genitourinary Comments: Melanotic stool  Musculoskeletal: She exhibits no edema or tenderness.  No midline spinal tenderness. No other signs of trauma.   Neurological: She is alert and oriented to person, place, and time.  Skin: Skin is warm and dry. She is not diaphoretic.  Psychiatric: She has a normal mood and affect. Her behavior is normal.  Nursing note and vitals reviewed.  ED Treatments / Results  DIAGNOSTIC STUDIES: Oxygen Saturation is 100% on RA, nl by my  interpretation.    COORDINATION OF CARE: 1:21 AM Discussed treatment plan with pt family at bedside which includes CT head, CT cervical spine, labs, laceration repair, update tetanus vaccination, and pt family agreed to plan.   Labs (all labs ordered are listed, but only abnormal results are displayed) Labs Reviewed  CBC WITH DIFFERENTIAL/PLATELET - Abnormal; Notable for the following:       Result Value   RBC 2.88 (*)    Hemoglobin 8.6 (*)    HCT 28.6 (*)    RDW 17.5 (*)    All other components within normal limits  BASIC METABOLIC PANEL - Abnormal; Notable for the following:    Calcium 7.7 (*)    GFR calc non Af Amer 60 (*)    All other components within normal limits  I-STAT CHEM 8, ED - Abnormal; Notable for the following:    Hemoglobin 8.5 (*)    HCT 25.0 (*)    All other components within normal limits  POC OCCULT BLOOD, ED - Abnormal; Notable for the following:    Fecal Occult Bld POSITIVE (*)    All other components within normal limits  TYPE AND SCREEN    EKG  EKG Interpretation None       Radiology Ct Head Wo Contrast  Result Date: 01/23/2016 CLINICAL DATA:  Patient fell, striking back of head. Laceration to the occipital and right parietal region. Patient is lethargic. EXAM: CT HEAD WITHOUT CONTRAST CT CERVICAL SPINE WITHOUT CONTRAST TECHNIQUE: Multidetector CT imaging of the head and cervical spine was performed following the standard protocol without intravenous contrast. Multiplanar CT image reconstructions of the cervical spine were also generated. COMPARISON:  MRI brain 11/30/2015. CT cervical spine 11/30/2015. CT head 11/30/2015. FINDINGS: CT HEAD FINDINGS Brain: Diffuse cerebral atrophy. Ventricular dilatation consistent with central atrophy. Low-attenuation changes in the deep white matter consistent with small vessel ischemia. No abnormal extra-axial fluid collections. No mass effect or midline shift. Gray-white matter junctions are distinct. Basal cisterns  are not effaced. No acute intracranial hemorrhage. Vascular: Vascular calcifications. The basilar tip appears prominent. This may be due to tortuosity but could indicate an intracranial aneurysm, measuring 6 mm. No evidence of acute hemorrhage. Skull: Normal. Negative for fracture or focal lesion. Sinuses/Orbits: Mucosal thickening in the maxillary antra with partial opacification of the left maxillary antrum, similar to prior study. Mastoid air cells are not opacified. Old nasal bone deformities. Other:  Subcutaneous scalp laceration over the posterior occipital region. CT CERVICAL SPINE FINDINGS Alignment: There is reversal of the usual cervical lordosis with slight retrolisthesis of C4 on C5 and C5 on C6. This is likely due to degenerative change. The alignment is unchanged since the prior study. Normal alignment of the facet joints. C1-2 articulation appears intact. Old osseous fragments over the tip of the odontoid process with loss of the anterior odontoid space, likely degenerative. Skull base and vertebrae: No acute fracture. No primary bone lesion or focal pathologic process. Soft tissues and spinal canal: No prevertebral fluid or swelling. No visible canal hematoma. Disc levels: Degenerative changes throughout the cervical spine with narrowed interspaces and endplate hypertrophic changes, most prominent at C3-4, C4-5, C5-6, and C6-7 levels. Prominent degenerative spurring in the facet joints and uncovertebral joints causing neural foraminal bony encroachment at multiple levels. Upper chest: Emphysematous changes and fibrosis in the lung apices. Other: Circumscribed hyperdense cystic structure demonstrated in the soft tissues of the posterior neck over the C3 spinous process. This likely represents a sebaceous cyst. No change since prior study. IMPRESSION: No acute intracranial abnormalities. Chronic atrophy and small vessel ischemic changes. 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 Consider follow-up with  elective CTA or MRI to confirm. No acute hemorrhage. Degenerative changes throughout the cervical spine. Reversal of cervical lordosis is unchanged since prior study and likely degenerative. No acute displaced fractures identified Electronically Signed   By: Lucienne Capers M.D.   On: 01/23/2016 04:53   Ct Cervical Spine Wo Contrast  Result Date: 01/23/2016 CLINICAL DATA:  Patient fell, striking back of head. Laceration to the occipital and right parietal region. Patient is lethargic. EXAM: CT HEAD WITHOUT CONTRAST CT CERVICAL SPINE WITHOUT CONTRAST TECHNIQUE: Multidetector CT imaging of the head and cervical spine was performed following the standard protocol without intravenous contrast. Multiplanar CT image reconstructions of the cervical spine were also generated. COMPARISON:  MRI brain 11/30/2015. CT cervical spine 11/30/2015. CT head 11/30/2015. FINDINGS: CT HEAD FINDINGS Brain: Diffuse cerebral atrophy. Ventricular dilatation consistent with central atrophy. Low-attenuation changes in the deep white matter consistent with small vessel ischemia. No abnormal extra-axial fluid collections. No mass effect or midline shift. Gray-white matter junctions are distinct. Basal cisterns are not effaced. No acute intracranial hemorrhage. Vascular: Vascular calcifications. The basilar tip appears prominent. This may be due to tortuosity but could indicate an intracranial aneurysm, measuring 6 mm. No evidence of acute hemorrhage. Skull: Normal. Negative for fracture or focal lesion. Sinuses/Orbits: Mucosal thickening in the maxillary antra with partial opacification of the left maxillary antrum, similar to prior study. Mastoid air cells are not opacified. Old nasal bone deformities. Other: Subcutaneous scalp laceration over the posterior occipital region. CT CERVICAL SPINE FINDINGS Alignment: There is reversal of the usual cervical lordosis with slight retrolisthesis of C4 on C5 and C5 on C6. This is likely due to  degenerative change. The alignment is unchanged since the prior study. Normal alignment of the facet joints. C1-2 articulation appears intact. Old osseous fragments over the tip of the odontoid process with loss of the anterior odontoid space, likely degenerative. Skull base and vertebrae: No acute fracture. No primary bone lesion or focal pathologic process. Soft tissues and spinal canal: No prevertebral fluid or swelling. No visible canal hematoma. Disc levels: Degenerative changes throughout the cervical spine with narrowed interspaces and endplate hypertrophic changes, most prominent at C3-4, C4-5, C5-6, and C6-7 levels. Prominent degenerative spurring in the facet joints and uncovertebral joints causing neural foraminal bony encroachment  at multiple levels. Upper chest: Emphysematous changes and fibrosis in the lung apices. Other: Circumscribed hyperdense cystic structure demonstrated in the soft tissues of the posterior neck over the C3 spinous process. This likely represents a sebaceous cyst. No change since prior study. IMPRESSION: No acute intracranial abnormalities. Chronic atrophy and small vessel ischemic changes. 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 Consider follow-up with elective CTA or MRI to confirm. No acute hemorrhage. Degenerative changes throughout the cervical spine. Reversal of cervical lordosis is unchanged since prior study and likely degenerative. No acute displaced fractures identified Electronically Signed   By: Lucienne Capers M.D.   On: 01/23/2016 04:53    Procedures .Marland KitchenLaceration Repair Date/Time: 01/23/2016 2:46 AM Performed by: Tyrone Nine Pietrina Jagodzinski Authorized by: Deno Etienne   Consent:    Consent obtained:  Verbal   Consent given by:  Patient   Risks discussed:  Pain and need for additional repair Anesthesia (see MAR for exact dosages):    Anesthesia method:  Local infiltration   Local anesthetic:  Lidocaine 1% WITH epi (2 cc used) Laceration details:    Location:  Scalp   Scalp  location: posterior occiput.   Length (cm):  1.5 Repair type:    Repair type:  Simple Pre-procedure details:    Preparation:  Patient was prepped and draped in usual sterile fashion Exploration:    Hemostasis achieved with:  Epinephrine   Wound exploration: entire depth of wound probed and visualized     Wound extent: no foreign bodies/material noted     Contaminated: no   Treatment:    Area cleansed with:  Saline   Amount of cleaning:  Standard   Irrigation solution:  Sterile saline   Irrigation method:  Syringe   Visualized foreign bodies/material removed: no   Skin repair:    Repair method:  Staples   Number of staples:  2 Approximation:    Approximation:  Close Post-procedure details:    Dressing:  Adhesive bandage and sterile dressing   Patient tolerance of procedure:  Tolerated well, no immediate complications     (including critical care time)  Medications Ordered in ED Medications  pantoprazole (PROTONIX) injection 40 mg (40 mg Intravenous Given 01/23/16 0528)  Tdap (BOOSTRIX) injection 0.5 mL (0.5 mLs Intramuscular Given 01/23/16 0236)  lidocaine-EPINEPHrine (XYLOCAINE-EPINEPHrine) 1 %-1:200000 (PF) injection 30 mL (30 mLs Intradermal Given by Other 01/23/16 0241)  acetaminophen (TYLENOL) tablet 1,000 mg (1,000 mg Oral Given 01/23/16 0259)  sodium chloride 0.9 % bolus 1,000 mL (0 mLs Intravenous Stopped 01/23/16 0511)  sodium chloride 0.9 % bolus 1,000 mL (1,000 mLs Intravenous New Bag/Given 01/23/16 0607)  ipratropium-albuterol (DUONEB) 0.5-2.5 (3) MG/3ML nebulizer solution 3 mL (3 mLs Nebulization Given 01/23/16 0607)     Initial Impression / Assessment and Plan / ED Course  I have reviewed the triage vital signs and the nursing notes.  Pertinent labs & imaging results that were available during my care of the patient were reviewed by me and considered in my medical decision making (see chart for details).  Clinical Course     76 yo F With a chief complaint of a  fall. This was unwitnessed. The nursing home thinks that she try to get out of bed and fell back and hit the back of her head. Patient complaining of pain to her head denies other areas of tenderness. Will CT head and C-spine. Mild pallor on my exam family is unsure if this is new or not will check i-STAT Chem-8.   Hbg with  a gram decrease from about a week ago. Patient recently had surgery so this may be the etiology. Patient had some transient hypotension while asleep. Improved with IV fluids and waking. Had a very bloody bowel movement.  On record review patient was started on a whole dose aspirin after surgery.  Concern for upper GI bleed. Start on Protonix.   CRITICAL CARE Performed by: Cecilio Asper   Total critical care time: 35 minutes  Critical care time was exclusive of separately billable procedures and treating other patients.  Critical care was necessary to treat or prevent imminent or life-threatening deterioration.  Critical care was time spent personally by me on the following activities: development of treatment plan with patient and/or surrogate as well as nursing, discussions with consultants, evaluation of patient's response to treatment, examination of patient, obtaining history from patient or surrogate, ordering and performing treatments and interventions, ordering and review of laboratory studies, ordering and review of radiographic studies, pulse oximetry and re-evaluation of patient's condition.   The patients results and plan were reviewed and discussed.   Any x-rays performed were independently reviewed by myself.   Differential diagnosis were considered with the presenting HPI.  Medications  pantoprazole (PROTONIX) injection 40 mg (40 mg Intravenous Given 01/23/16 0528)  Tdap (BOOSTRIX) injection 0.5 mL (0.5 mLs Intramuscular Given 01/23/16 0236)  lidocaine-EPINEPHrine (XYLOCAINE-EPINEPHrine) 1 %-1:200000 (PF) injection 30 mL (30 mLs Intradermal Given by  Other 01/23/16 0241)  acetaminophen (TYLENOL) tablet 1,000 mg (1,000 mg Oral Given 01/23/16 0259)  sodium chloride 0.9 % bolus 1,000 mL (0 mLs Intravenous Stopped 01/23/16 0511)  sodium chloride 0.9 % bolus 1,000 mL (1,000 mLs Intravenous New Bag/Given 01/23/16 0607)  ipratropium-albuterol (DUONEB) 0.5-2.5 (3) MG/3ML nebulizer solution 3 mL (3 mLs Nebulization Given 01/23/16 0607)    Vitals:   01/23/16 0515 01/23/16 0553 01/23/16 0600 01/23/16 0606  BP: 95/61 (!) 86/47 (!) 123/52 (!) 94/52  Pulse: 81 86 82 76  Resp: '20 24 25 24  '$ Temp:      TempSrc:      SpO2: 99% 91% 90% 94%    Final diagnoses:  Upper GI bleed    Admission/ observation were discussed with the admitting physician, patient and/or family and they are comfortable with the plan.    Final Clinical Impressions(s) / ED Diagnoses   Final diagnoses:  Upper GI bleed    New Prescriptions New Prescriptions   No medications on file   I personally performed the services described in this documentation, which was scribed in my presence. The recorded information has been reviewed and is accurate.      Deno Etienne, DO 01/23/16 [phone]

## 2016-01-24 ENCOUNTER — Inpatient Hospital Stay (HOSPITAL_COMMUNITY): Payer: Medicare Other

## 2016-01-24 ENCOUNTER — Encounter (HOSPITAL_COMMUNITY): Payer: Self-pay | Admitting: Radiology

## 2016-01-24 DIAGNOSIS — R195 Other fecal abnormalities: Secondary | ICD-10-CM | POA: Diagnosis not present

## 2016-01-24 DIAGNOSIS — E0590 Thyrotoxicosis, unspecified without thyrotoxic crisis or storm: Secondary | ICD-10-CM | POA: Diagnosis present

## 2016-01-24 DIAGNOSIS — R488 Other symbolic dysfunctions: Secondary | ICD-10-CM | POA: Diagnosis not present

## 2016-01-24 DIAGNOSIS — I5031 Acute diastolic (congestive) heart failure: Secondary | ICD-10-CM | POA: Diagnosis not present

## 2016-01-24 DIAGNOSIS — I272 Pulmonary hypertension, unspecified: Secondary | ICD-10-CM | POA: Diagnosis present

## 2016-01-24 DIAGNOSIS — Z96641 Presence of right artificial hip joint: Secondary | ICD-10-CM | POA: Diagnosis present

## 2016-01-24 DIAGNOSIS — R64 Cachexia: Secondary | ICD-10-CM | POA: Diagnosis present

## 2016-01-24 DIAGNOSIS — Z23 Encounter for immunization: Secondary | ICD-10-CM | POA: Diagnosis not present

## 2016-01-24 DIAGNOSIS — Z9181 History of falling: Secondary | ICD-10-CM | POA: Diagnosis not present

## 2016-01-24 DIAGNOSIS — M6281 Muscle weakness (generalized): Secondary | ICD-10-CM | POA: Diagnosis not present

## 2016-01-24 DIAGNOSIS — K5732 Diverticulitis of large intestine without perforation or abscess without bleeding: Secondary | ICD-10-CM | POA: Diagnosis not present

## 2016-01-24 DIAGNOSIS — I130 Hypertensive heart and chronic kidney disease with heart failure and stage 1 through stage 4 chronic kidney disease, or unspecified chronic kidney disease: Secondary | ICD-10-CM | POA: Diagnosis present

## 2016-01-24 DIAGNOSIS — G894 Chronic pain syndrome: Secondary | ICD-10-CM | POA: Diagnosis present

## 2016-01-24 DIAGNOSIS — K5901 Slow transit constipation: Secondary | ICD-10-CM | POA: Diagnosis not present

## 2016-01-24 DIAGNOSIS — K922 Gastrointestinal hemorrhage, unspecified: Secondary | ICD-10-CM | POA: Diagnosis not present

## 2016-01-24 DIAGNOSIS — R1311 Dysphagia, oral phase: Secondary | ICD-10-CM | POA: Diagnosis not present

## 2016-01-24 DIAGNOSIS — E876 Hypokalemia: Secondary | ICD-10-CM | POA: Diagnosis not present

## 2016-01-24 DIAGNOSIS — D500 Iron deficiency anemia secondary to blood loss (chronic): Secondary | ICD-10-CM | POA: Diagnosis not present

## 2016-01-24 DIAGNOSIS — K5790 Diverticulosis of intestine, part unspecified, without perforation or abscess without bleeding: Secondary | ICD-10-CM | POA: Diagnosis present

## 2016-01-24 DIAGNOSIS — I129 Hypertensive chronic kidney disease with stage 1 through stage 4 chronic kidney disease, or unspecified chronic kidney disease: Secondary | ICD-10-CM | POA: Diagnosis not present

## 2016-01-24 DIAGNOSIS — F419 Anxiety disorder, unspecified: Secondary | ICD-10-CM | POA: Diagnosis present

## 2016-01-24 DIAGNOSIS — I471 Supraventricular tachycardia: Secondary | ICD-10-CM | POA: Diagnosis not present

## 2016-01-24 DIAGNOSIS — R933 Abnormal findings on diagnostic imaging of other parts of digestive tract: Secondary | ICD-10-CM | POA: Diagnosis not present

## 2016-01-24 DIAGNOSIS — W19XXXD Unspecified fall, subsequent encounter: Secondary | ICD-10-CM | POA: Diagnosis not present

## 2016-01-24 DIAGNOSIS — R000 Tachycardia, unspecified: Secondary | ICD-10-CM | POA: Diagnosis not present

## 2016-01-24 DIAGNOSIS — F329 Major depressive disorder, single episode, unspecified: Secondary | ICD-10-CM | POA: Diagnosis present

## 2016-01-24 DIAGNOSIS — W19XXXA Unspecified fall, initial encounter: Secondary | ICD-10-CM | POA: Diagnosis not present

## 2016-01-24 DIAGNOSIS — E43 Unspecified severe protein-calorie malnutrition: Secondary | ICD-10-CM | POA: Diagnosis present

## 2016-01-24 DIAGNOSIS — N183 Chronic kidney disease, stage 3 (moderate): Secondary | ICD-10-CM | POA: Diagnosis present

## 2016-01-24 DIAGNOSIS — K6389 Other specified diseases of intestine: Secondary | ICD-10-CM | POA: Diagnosis not present

## 2016-01-24 DIAGNOSIS — S0101XA Laceration without foreign body of scalp, initial encounter: Secondary | ICD-10-CM | POA: Diagnosis present

## 2016-01-24 DIAGNOSIS — J449 Chronic obstructive pulmonary disease, unspecified: Secondary | ICD-10-CM | POA: Diagnosis not present

## 2016-01-24 DIAGNOSIS — D509 Iron deficiency anemia, unspecified: Secondary | ICD-10-CM | POA: Diagnosis not present

## 2016-01-24 DIAGNOSIS — Z6820 Body mass index (BMI) 20.0-20.9, adult: Secondary | ICD-10-CM | POA: Diagnosis not present

## 2016-01-24 DIAGNOSIS — I959 Hypotension, unspecified: Secondary | ICD-10-CM

## 2016-01-24 DIAGNOSIS — K56699 Other intestinal obstruction unspecified as to partial versus complete obstruction: Secondary | ICD-10-CM | POA: Diagnosis present

## 2016-01-24 DIAGNOSIS — W01198A Fall on same level from slipping, tripping and stumbling with subsequent striking against other object, initial encounter: Secondary | ICD-10-CM | POA: Diagnosis not present

## 2016-01-24 DIAGNOSIS — J80 Acute respiratory distress syndrome: Secondary | ICD-10-CM | POA: Diagnosis not present

## 2016-01-24 DIAGNOSIS — R197 Diarrhea, unspecified: Secondary | ICD-10-CM | POA: Diagnosis not present

## 2016-01-24 DIAGNOSIS — I071 Rheumatic tricuspid insufficiency: Secondary | ICD-10-CM | POA: Diagnosis present

## 2016-01-24 DIAGNOSIS — K2970 Gastritis, unspecified, without bleeding: Secondary | ICD-10-CM | POA: Diagnosis present

## 2016-01-24 DIAGNOSIS — J441 Chronic obstructive pulmonary disease with (acute) exacerbation: Secondary | ICD-10-CM | POA: Diagnosis not present

## 2016-01-24 DIAGNOSIS — I5033 Acute on chronic diastolic (congestive) heart failure: Secondary | ICD-10-CM | POA: Diagnosis present

## 2016-01-24 DIAGNOSIS — K5730 Diverticulosis of large intestine without perforation or abscess without bleeding: Secondary | ICD-10-CM | POA: Diagnosis not present

## 2016-01-24 DIAGNOSIS — K5520 Angiodysplasia of colon without hemorrhage: Secondary | ICD-10-CM | POA: Diagnosis not present

## 2016-01-24 DIAGNOSIS — K56609 Unspecified intestinal obstruction, unspecified as to partial versus complete obstruction: Secondary | ICD-10-CM | POA: Diagnosis not present

## 2016-01-24 DIAGNOSIS — K5669 Other partial intestinal obstruction: Secondary | ICD-10-CM | POA: Diagnosis not present

## 2016-01-24 DIAGNOSIS — S72011D Unspecified intracapsular fracture of right femur, subsequent encounter for closed fracture with routine healing: Secondary | ICD-10-CM | POA: Diagnosis not present

## 2016-01-24 DIAGNOSIS — R0902 Hypoxemia: Secondary | ICD-10-CM | POA: Diagnosis present

## 2016-01-24 DIAGNOSIS — Y92121 Bathroom in nursing home as the place of occurrence of the external cause: Secondary | ICD-10-CM | POA: Diagnosis not present

## 2016-01-24 LAB — CBC WITH DIFFERENTIAL/PLATELET
Basophils Absolute: 0 10*3/uL (ref 0.0–0.1)
Basophils Relative: 0 %
EOS PCT: 0 %
Eosinophils Absolute: 0 10*3/uL (ref 0.0–0.7)
HCT: 30.2 % — ABNORMAL LOW (ref 36.0–46.0)
Hemoglobin: 9.4 g/dL — ABNORMAL LOW (ref 12.0–15.0)
LYMPHS PCT: 6 %
Lymphs Abs: 0.8 10*3/uL (ref 0.7–4.0)
MCH: 29.7 pg (ref 26.0–34.0)
MCHC: 31.1 g/dL (ref 30.0–36.0)
MCV: 95.6 fL (ref 78.0–100.0)
MONO ABS: 0.6 10*3/uL (ref 0.1–1.0)
Monocytes Relative: 5 %
Neutro Abs: 11.4 10*3/uL — ABNORMAL HIGH (ref 1.7–7.7)
Neutrophils Relative %: 89 %
PLATELETS: 204 10*3/uL (ref 150–400)
RBC: 3.16 MIL/uL — ABNORMAL LOW (ref 3.87–5.11)
RDW: 17 % — AB (ref 11.5–15.5)
WBC: 12.8 10*3/uL — ABNORMAL HIGH (ref 4.0–10.5)

## 2016-01-24 LAB — HEMOGLOBIN AND HEMATOCRIT, BLOOD
HCT: 30 % — ABNORMAL LOW (ref 36.0–46.0)
HCT: 32 % — ABNORMAL LOW (ref 36.0–46.0)
HEMATOCRIT: 31.2 % — AB (ref 36.0–46.0)
Hemoglobin: 9.3 g/dL — ABNORMAL LOW (ref 12.0–15.0)
Hemoglobin: 9.5 g/dL — ABNORMAL LOW (ref 12.0–15.0)
Hemoglobin: 9.9 g/dL — ABNORMAL LOW (ref 12.0–15.0)

## 2016-01-24 LAB — BASIC METABOLIC PANEL
ANION GAP: 10 (ref 5–15)
BUN: 11 mg/dL (ref 6–20)
CO2: 29 mmol/L (ref 22–32)
Calcium: 7.8 mg/dL — ABNORMAL LOW (ref 8.9–10.3)
Chloride: 101 mmol/L (ref 101–111)
Creatinine, Ser: 0.91 mg/dL (ref 0.44–1.00)
GFR calc Af Amer: >60 mL/min (ref >60)
GFR calc non Af Amer: 60 mL/min — ABNORMAL LOW (ref >60)
Glucose, Bld: 86 mg/dL (ref 65–99)
POTASSIUM: 3.6 mmol/L (ref 3.5–5.1)
SODIUM: 140 mmol/L (ref 135–145)

## 2016-01-24 LAB — MAGNESIUM: MAGNESIUM: 2 mg/dL (ref 1.7–2.4)

## 2016-01-24 LAB — T4, FREE: FREE T4: 0.61 ng/dL (ref 0.61–1.12)

## 2016-01-24 MED ORDER — IPRATROPIUM BROMIDE 0.02 % IN SOLN
0.5000 mg | Freq: Two times a day (BID) | RESPIRATORY_TRACT | Status: DC
  Administered 2016-01-24 – 2016-01-25 (×2): 0.5 mg via RESPIRATORY_TRACT
  Filled 2016-01-24 (×3): qty 2.5

## 2016-01-24 MED ORDER — FLECAINIDE ACETATE 50 MG PO TABS
50.0000 mg | ORAL_TABLET | Freq: Two times a day (BID) | ORAL | Status: DC
  Administered 2016-01-24 – 2016-01-28 (×9): 50 mg via ORAL
  Filled 2016-01-24 (×10): qty 1

## 2016-01-24 MED ORDER — IOPAMIDOL (ISOVUE-300) INJECTION 61%
INTRAVENOUS | Status: AC
  Administered 2016-01-24: 100 mL via INTRAVENOUS
  Filled 2016-01-24: qty 100

## 2016-01-24 MED ORDER — METOPROLOL TARTRATE 5 MG/5ML IV SOLN
5.0000 mg | Freq: Once | INTRAVENOUS | Status: AC
Start: 2016-01-24 — End: 2016-01-24
  Administered 2016-01-24: 5 mg via INTRAVENOUS
  Filled 2016-01-24: qty 5

## 2016-01-24 MED ORDER — OXYCODONE-ACETAMINOPHEN 5-325 MG PO TABS
1.0000 | ORAL_TABLET | ORAL | Status: DC | PRN
  Administered 2016-01-24 – 2016-01-28 (×18): 1 via ORAL
  Filled 2016-01-24 (×18): qty 1

## 2016-01-24 MED ORDER — METOPROLOL TARTRATE 25 MG PO TABS
25.0000 mg | ORAL_TABLET | Freq: Two times a day (BID) | ORAL | Status: DC
  Administered 2016-01-24 – 2016-01-26 (×5): 25 mg via ORAL
  Filled 2016-01-24 (×5): qty 1

## 2016-01-24 MED ORDER — PANTOPRAZOLE SODIUM 40 MG PO TBEC
40.0000 mg | DELAYED_RELEASE_TABLET | Freq: Every day | ORAL | Status: DC
Start: 2016-01-24 — End: 2016-01-29
  Administered 2016-01-25 – 2016-01-28 (×4): 40 mg via ORAL
  Filled 2016-01-24 (×4): qty 1

## 2016-01-24 MED ORDER — IOPAMIDOL (ISOVUE-300) INJECTION 61%
INTRAVENOUS | Status: AC
  Administered 2016-01-24: 30 mL
  Filled 2016-01-24: qty 30

## 2016-01-24 MED ORDER — LEVALBUTEROL HCL 1.25 MG/0.5ML IN NEBU
1.2500 mg | INHALATION_SOLUTION | Freq: Two times a day (BID) | RESPIRATORY_TRACT | Status: DC
  Administered 2016-01-24 – 2016-01-25 (×2): 1.25 mg via RESPIRATORY_TRACT
  Filled 2016-01-24 (×3): qty 0.5

## 2016-01-24 NOTE — Progress Notes (Signed)
Pt has had approx 10-12 runs of SVT over the past hour in and out of sinus tach. We captured one on an EKG. Denies pain or distress. MD Hugelmeyer ordered a chem add on lab. Cardiology fellow Corotto paged twice. Rapid response RN asked for input and she said dayshift rapid response will round. Awaiting further orders.

## 2016-01-24 NOTE — Progress Notes (Signed)
Patient ID: Dawn [name], female   DOB: Jan 02, 1941, 76 y.o.   MRN: [id number]    Progress Note   Subjective   Awake, alert- c/o headache and back pain- able to laugh and excited about having something to eat.  No c/o abdominal discomfort or nausea, says stools have been dark since on iron  Being treated for SVT  hgb stable at 9.3 (has been anemic since hip surgery)   Objective   Vital signs in last 24 hours: Temp:  [98.7 F (37.1 C)-99.7 F (37.6 C)] 99.7 F (37.6 C) (01/14 0726) Pulse Rate:  [62-155] 102 (01/14 0910) Resp:  [20-31] 25 (01/14 0726) BP: (76-129)/(47-71) 112/62 (01/14 0726) SpO2:  [93 %-100 %] 99 % (01/14 0726) Weight:  [131 lb 15.8 oz (59.9 kg)] 131 lb 15.8 oz (59.9 kg) (01/14 0006) Last BM Date: 01/23/16 General:   Elderly  white female in NAD conversant -much improved since yesterday  Heart:  Regular rate and rhythm; no murmurs Lungs: Respirations even and unlabored, lungs CTA bilaterally Abdomen:  Soft, nontender and nondistended. Normal bowel sounds. Extremities:  Without edema. Neurologic:  Alert and oriented,  grossly normal neurologically. Psych:  Cooperative. Normal mood and affect.  Intake/Output from previous day: 01/13 0701 - 01/14 0700 In: 1000 [IV Piggyback:1000] Out: -  Intake/Output this shift: No intake/output data recorded.  Lab Results:  Recent Labs  01/23/16 0516  01/23/16 2147 01/24/16 0343 01/24/16 0853  WBC 9.0  --   --  12.8*  --   HGB 8.6*  < > 10.0* 9.4* 9.3*  HCT 28.6*  < > 32.8* 30.2* 30.0*  PLT 177  --   --  204  --   < > = values in this interval not displayed. BMET  Recent Labs  01/23/16 0245 01/23/16 0516 01/24/16 0343  NA 142 141 140  K 4.1 4.1 3.6  CL 101 108 101  CO2  --  27 29  GLUCOSE 97 87 86  BUN '12 11 11  '$ CREATININE 1.00 0.91 0.91  CALCIUM  --  7.7* 7.8*   LFT  Recent Labs  01/23/16 0846  PROT 4.7*  ALBUMIN 1.7*  AST 12*  ALT 10*  ALKPHOS 94  BILITOT 0.4  BILIDIR 0.1  IBILI  0.3   PT/INR No results for input(s): LABPROT, INR in the last 72 hours.  Studies/Results: Dg Chest 2 View  Result Date: 01/23/2016 CLINICAL DATA:  Fall tonight. Cough. Low oxygen saturation. Hypotension. Previous smoker. History of COPD. EXAM: CHEST  2 VIEW COMPARISON:  01/12/2016 FINDINGS: Cardiac enlargement with mild pulmonary vascular congestion. Interstitial changes in the lung bases may be due to chronic fibrosis or acute edema. Small bilateral pleural effusions. No focal consolidation. Emphysematous changes in the lungs. Calcification of the aorta. IMPRESSION: Cardiac enlargement with interstitial pattern possibly representing edema. Small bilateral pleural effusions. Emphysematous changes. Electronically Signed   By: Lucienne Capers M.D.   On: 01/23/2016 06:21   Ct Head Wo Contrast  Result Date: 01/23/2016 CLINICAL DATA:  Patient fell, striking back of head. Laceration to the occipital and right parietal region. Patient is lethargic. EXAM: CT HEAD WITHOUT CONTRAST CT CERVICAL SPINE WITHOUT CONTRAST TECHNIQUE: Multidetector CT imaging of the head and cervical spine was performed following the standard protocol without intravenous contrast. Multiplanar CT image reconstructions of the cervical spine were also generated. COMPARISON:  MRI brain 11/30/2015. CT cervical spine 11/30/2015. CT head 11/30/2015. FINDINGS: CT HEAD FINDINGS Brain: Diffuse cerebral atrophy. Ventricular dilatation consistent with central  atrophy. Low-attenuation changes in the deep white matter consistent with small vessel ischemia. No abnormal extra-axial fluid collections. No mass effect or midline shift. Gray-white matter junctions are distinct. Basal cisterns are not effaced. No acute intracranial hemorrhage. Vascular: Vascular calcifications. The basilar tip appears prominent. This may be due to tortuosity but could indicate an intracranial aneurysm, measuring 6 mm. No evidence of acute hemorrhage. Skull: Normal. Negative  for fracture or focal lesion. Sinuses/Orbits: Mucosal thickening in the maxillary antra with partial opacification of the left maxillary antrum, similar to prior study. Mastoid air cells are not opacified. Old nasal bone deformities. Other: Subcutaneous scalp laceration over the posterior occipital region. CT CERVICAL SPINE FINDINGS Alignment: There is reversal of the usual cervical lordosis with slight retrolisthesis of C4 on C5 and C5 on C6. This is likely due to degenerative change. The alignment is unchanged since the prior study. Normal alignment of the facet joints. C1-2 articulation appears intact. Old osseous fragments over the tip of the odontoid process with loss of the anterior odontoid space, likely degenerative. Skull base and vertebrae: No acute fracture. No primary bone lesion or focal pathologic process. Soft tissues and spinal canal: No prevertebral fluid or swelling. No visible canal hematoma. Disc levels: Degenerative changes throughout the cervical spine with narrowed interspaces and endplate hypertrophic changes, most prominent at C3-4, C4-5, C5-6, and C6-7 levels. Prominent degenerative spurring in the facet joints and uncovertebral joints causing neural foraminal bony encroachment at multiple levels. Upper chest: Emphysematous changes and fibrosis in the lung apices. Other: Circumscribed hyperdense cystic structure demonstrated in the soft tissues of the posterior neck over the C3 spinous process. This likely represents a sebaceous cyst. No change since prior study. IMPRESSION: No acute intracranial abnormalities. Chronic atrophy and small vessel ischemic changes. 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 Consider follow-up with elective CTA or MRI to confirm. No acute hemorrhage. Degenerative changes throughout the cervical spine. Reversal of cervical lordosis is unchanged since prior study and likely degenerative. No acute displaced fractures identified Electronically Signed   By: Lucienne Capers  M.D.   On: 01/23/2016 04:53   Ct Cervical Spine Wo Contrast  Result Date: 01/23/2016 CLINICAL DATA:  Patient fell, striking back of head. Laceration to the occipital and right parietal region. Patient is lethargic. EXAM: CT HEAD WITHOUT CONTRAST CT CERVICAL SPINE WITHOUT CONTRAST TECHNIQUE: Multidetector CT imaging of the head and cervical spine was performed following the standard protocol without intravenous contrast. Multiplanar CT image reconstructions of the cervical spine were also generated. COMPARISON:  MRI brain 11/30/2015. CT cervical spine 11/30/2015. CT head 11/30/2015. FINDINGS: CT HEAD FINDINGS Brain: Diffuse cerebral atrophy. Ventricular dilatation consistent with central atrophy. Low-attenuation changes in the deep white matter consistent with small vessel ischemia. No abnormal extra-axial fluid collections. No mass effect or midline shift. Gray-white matter junctions are distinct. Basal cisterns are not effaced. No acute intracranial hemorrhage. Vascular: Vascular calcifications. The basilar tip appears prominent. This may be due to tortuosity but could indicate an intracranial aneurysm, measuring 6 mm. No evidence of acute hemorrhage. Skull: Normal. Negative for fracture or focal lesion. Sinuses/Orbits: Mucosal thickening in the maxillary antra with partial opacification of the left maxillary antrum, similar to prior study. Mastoid air cells are not opacified. Old nasal bone deformities. Other: Subcutaneous scalp laceration over the posterior occipital region. CT CERVICAL SPINE FINDINGS Alignment: There is reversal of the usual cervical lordosis with slight retrolisthesis of C4 on C5 and C5 on C6. This is likely due to degenerative change. The  alignment is unchanged since the prior study. Normal alignment of the facet joints. C1-2 articulation appears intact. Old osseous fragments over the tip of the odontoid process with loss of the anterior odontoid space, likely degenerative. Skull base and  vertebrae: No acute fracture. No primary bone lesion or focal pathologic process. Soft tissues and spinal canal: No prevertebral fluid or swelling. No visible canal hematoma. Disc levels: Degenerative changes throughout the cervical spine with narrowed interspaces and endplate hypertrophic changes, most prominent at C3-4, C4-5, C5-6, and C6-7 levels. Prominent degenerative spurring in the facet joints and uncovertebral joints causing neural foraminal bony encroachment at multiple levels. Upper chest: Emphysematous changes and fibrosis in the lung apices. Other: Circumscribed hyperdense cystic structure demonstrated in the soft tissues of the posterior neck over the C3 spinous process. This likely represents a sebaceous cyst. No change since prior study. IMPRESSION: No acute intracranial abnormalities. Chronic atrophy and small vessel ischemic changes. 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 Consider follow-up with elective CTA or MRI to confirm. No acute hemorrhage. Degenerative changes throughout the cervical spine. Reversal of cervical lordosis is unchanged since prior study and likely degenerative. No acute displaced fractures identified Electronically Signed   By: Lucienne Capers M.D.   On: 01/23/2016 04:53   Dg Chest Port 1 View  Result Date: 01/23/2016 CLINICAL DATA:  Hypoxia. EXAM: PORTABLE CHEST 1 VIEW COMPARISON:  Earlier today. FINDINGS: Stable enlarged cardiac silhouette. Mild increase in prominence of the pulmonary vasculature with stable prominence of the interstitial markings. Mild scoliosis. IMPRESSION: Stable cardiomegaly with mildly progressive pulmonary vascular congestion and stable interstitial pulmonary edema. Electronically Signed   By: Claudie Revering M.D.   On: 01/23/2016 13:45    IMP/PLAN:  #1 76 yo WF admitted yesterday after she fell in [place] and hit her head Suspect hypotension is causing her fall episodes  #2 SVT #3 recent hip frx secondary to another fall #4 anemia - post hip frx -  stable  #5 dark stool noted on admit/heme +  -on high dose ASA post hip surgery, and on oral iron  No evidence for active GI bleeding  #6  Abnormal sigmoid colon  With stricture of unclear etiology . Prior attempts at colonoscopy 11/2015 unsuccessful ,question extrinsic process .  Plan; Advance diet - soft Continue daily PPI Stop oral iron as confusing picture Do not plan EGD at this time  Pt does need further workup of Colon  Process- will start with  Repeat Ct abd/pelvis, then may need Gastrograffin  BE depending on findings at CT IMP/PL  Principal Problem:   Fall Active Problems:   Anxiety   Chronic pain syndrome   COPD (chronic obstructive pulmonary disease) (HCC)   Hyperthyroidism   Hypotension   SVT (supraventricular tachycardia) (H[facility])   Depression   Upper GI bleed   Blood loss anemia   Heme + stool   Abnormal CT scan, colon   Acute diastolic CHF (congestive heart failure) (HCC)   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

## 2016-01-24 NOTE — Progress Notes (Signed)
Pt's heart rate is irregular in and out of normal sinus and sinus tach in the 90s with lots of PVCs. She has had several runs of SVT of anywhere between 20 and 40 beats at a time. Asymptomatic, pt sleeping and dozing during these episodes. Denies chest pain or distress. Bp=100/58. On call MD Hugelmeyer notified and she ordered a magnesium lab. Cardiology fellow Carotto notified and he said to give pt's dose of po digoxin now instead of the scheduled 10am see MAR. Will continue to monitor.

## 2016-01-24 NOTE — Consult Note (Signed)
CARDIOLOGY CONSULT NOTE     Primary Care Physician: [name], MD Referring Physician:  Admit Date: 01/23/2016  Reason for consultation:  Dawn [name] is a 76 y.o. female with a h/o COPD, anemia, HTN, Hyperthyroidism, CKD stage III and hip surgery who presented to the Emergency Department from [place] where she is recovering after her hip surgery. Patient fell when trying to get up off the toilet and hit the back of her head against the toilet.    She had an episode of SVT when admitted for sepsis in November 2017. 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 She returns today with more runs of SVT with rates in the 150s.    Today, she denies symptoms of palpitations, chest pain, shortness of breath, orthopnea, PND, lower extremity edema, dizziness, presyncope, syncope, or neurologic sequela. The patient is tolerating medications without difficulties and is otherwise without complaint today.   Past Medical History:  Diagnosis Date  . COPD (chronic obstructive pulmonary disease) ([place])   . History of anxiety   . History of depression   . Spinal stenosis   . UTI (urinary tract infection)    Past Surgical History:  Procedure Laterality Date  . FLEXIBLE SIGMOIDOSCOPY Left 11/18/2015   Procedure: FLEXIBLE SIGMOIDOSCOPY;  Surgeon: [name], MD;  Location: [facility];  Service: Endoscopy;  Laterality: Left;  . FLEXIBLE SIGMOIDOSCOPY N/A 11/20/2015   Procedure: FLEXIBLE SIGMOIDOSCOPY;  Surgeon: [name], MD;  Location: [facility] ENDOSCOPY;  Service: Endoscopy;  Laterality: N/A;  . HIP ARTHROPLASTY Right 01/12/2016   Procedure: ARTHROPLASTY BIPOLAR HIP (HEMIARTHROPLASTY);  Surgeon: [name], MD;  Location: [facility];  Service: Orthopedics;  Laterality: Right;    . Chlorhexidine Gluconate Cloth  6 each Topical Q0600  . digoxin  0.125 mg Oral Daily  . furosemide  20 mg Intravenous Q12H  . ipratropium  0.5 mg Nebulization TID  . levalbuterol  1.25 mg  Nebulization TID  . mupirocin ointment  1 application Nasal BID  . pantoprazole  40 mg Intravenous Q12H  . sodium chloride flush  3 mL Intravenous Q12H     No Known Allergies  Social History   Social History  . Marital status: Divorced    Spouse name: N/A  . Number of children: N/A  . Years of education: N/A   Occupational History  . Not on file.   Social History Main Topics  . Smoking status: Former Research scientist (life sciences)  . Smokeless tobacco: Never Used     Comment: 01/18/16 patient unsure of quit date  . Alcohol use No  . Drug use: No  . Sexual activity: Not Currently   Other Topics Concern  . Not on file   Social History Narrative  . No narrative on file    Family History  Problem Relation Age of Onset  . Heart attack Mother   . Diabetes Father   . Thyroid disease Neg Hx     ROS- All systems are reviewed and negative except as per the HPI above  Physical Exam: Telemetry: Vitals:   01/24/16 0607 01/24/16 0609 01/24/16 0720 01/24/16 0726  BP:  (!) 129/56  112/62  Pulse: 92 100  (!) 155  Resp: (!) 26 (!) 31  (!) 25  Temp:    99.7 F (37.6 C)  TempSrc:    Axillary  SpO2: 96% 96% 95% 99%  Weight:      Height:        GEN- The patient is well appearing, alert and  oriented x 3 today.   Head- normocephalic, atraumatic Eyes-  Sclera clear, conjunctiva pink Ears- hearing intact Oropharynx- clear Neck- supple, no JVP Lymph- no cervical lymphadenopathy Lungs- Clear to ausculation bilaterally, normal work of breathing Heart- tachycardic, regular, no murmurs, rubs or gallops, PMI not laterally displaced GI- soft, NT, ND, + BS Extremities- no clubbing, cyanosis, or edema MS- no significant deformity or atrophy Skin- no rash or lesion Psych- euthymic mood, full affect Neuro- strength and sensation are intact  EKG: sinus tachycardia  Telemetry: episodes of sinus tachycardia with SVT, possibly AT  Labs:   Lab Results  Component Value Date   WBC 12.8 (H) 01/24/2016    HGB 9.4 (L) 01/24/2016   HCT 30.2 (L) 01/24/2016   MCV 95.6 01/24/2016   PLT 204 01/24/2016    Recent Labs Lab 01/23/16 0516 01/23/16 0846  NA 141  --   K 4.1  --   CL 108  --   CO2 27  --   BUN 11  --   CREATININE 0.91  --   CALCIUM 7.7*  --   PROT  --  4.7*  BILITOT  --  0.4  ALKPHOS  --  94  ALT  --  10*  AST  --  12*  GLUCOSE 87  --    Lab Results  Component Value Date   CKTOTAL 87 04/29/2008   TROPONINI <0.03 01/23/2016    Lab Results  Component Value Date   CHOL 107 12/01/2015   Lab Results  Component Value Date   HDL 27 (L) 12/01/2015   Lab Results  Component Value Date   LDLCALC 59 12/01/2015   Lab Results  Component Value Date   TRIG 107 12/01/2015   Lab Results  Component Value Date   CHOLHDL 4.0 12/01/2015   No results found for: [facility]    Radiology: Cardiac enlargement with interstitial pattern possibly representing edema. Small bilateral pleural effusions. Emphysematous changes.  Echo: - Left ventricle: The cavity size was normal. Wall thickness was   increased in a pattern of moderate LVH. Systolic function was   normal. The estimated ejection fraction was in the range of 50%   to 55%. Doppler parameters are consistent with abnormal left   ventricular relaxation (grade 1 diastolic dysfunction). The E/e&'   ratio is <8, suggesting normal LV filling pressure. - Aortic valve: Mildly calcified leaflets. There was mild   regurgitation. - Left atrium: The atrium was mildly dilated. - Right ventricle: The cavity size was mildly dilated. The   moderator band was prominent. Systolic function was normal. - Right atrium: The atrium was mildly dilated. - Tricuspid valve: There was moderate regurgitation. - Pulmonary arteries: PA peak pressure: 64 mm Hg (S). - Inferior vena cava: The vessel was dilated. The respirophasic   diameter changes were blunted (< 50%), consistent with elevated   central venous pressure.  ASSESSMENT AND PLAN:   1.  SVT - Interviewing telemetry and her EKGs, she is in sinus tachycardia most of the time, but does have episodes of SVT with rates close to 150 bpm. At times it appears that these episodes are due to atrial tachycardia, but there are also times P waves are difficult to distinguish. I Jaelon Gatley start her on metoprolol 25 mg today as well as flecainide to see if this Fenix Rorke help with her SVT. She Sameer Teeple need outpatient Myoview to see if she does have any evidence for ischemia.   2. Acute diastolic CHF - respiratory status improved.  3. S/p Fall - Head CT neg for acute bleed.  She denies syncope.   4. Hypotension - blood pressure much improved.    5. Heme + stool - Per primary service.    Renea Schoonmaker Meredith Leeds, MD 01/24/2016  7:41 AM

## 2016-01-24 NOTE — Progress Notes (Signed)
Patient given two bottles of contrast for CT abdomen exam. Pt verbalized understanding of the need to drink two bottles in two hours. Pt checked on in 10-15 minutes intervals; pt not drinking contrast. Pt requested contrast on ice which was done. Pt still not drinking contrast after one hour stating she is short of breath. CT notified. CT tech stated they would contact MD to notify. Will continue to monitor. Roselyn Reef Chuck Caban,RN

## 2016-01-24 NOTE — Care Management Obs Status (Signed)
[place] NOTIFICATION   Patient Details  Name: Dawn [name] MRN: [id number] Date of Birth: 09/13/1940   Medicare Observation Status Notification Given:  Yes    Dawayne Patricia, RN 01/24/2016, 2:03 PM

## 2016-01-24 NOTE — Progress Notes (Signed)
PROGRESS NOTE  Dawn [name] WJX:[id number] DOB: [birth year] DOA: 01/23/2016 PCP: [name], MD   LOS: 0 days   Brief Narrative: Dawn [name] is a 76 y.o. female with medical history significant of COPD, anemia, HTN, Hyperthyroidism, CKD stage III and hip surgery who presented to the Emergency Department from [place] where she is recovering after her hip surgery. Patient fell when trying to get up off the toilet and hit the back of her head against the toilet.    Assessment & Plan: Principal Problem:   Fall Active Problems:   Anxiety   Chronic pain syndrome   COPD (chronic obstructive pulmonary disease) (HCC)   Hyperthyroidism   Hypotension   SVT (supraventricular tachycardia) (HCC)   Depression   Upper GI bleed   Blood loss anemia   Heme + stool   Abnormal CT scan, colon   Acute diastolic CHF (congestive heart failure) (HCC)   Paroxysmal junctional tachycardia (HCC)   Hypoxia   Concern for GI bleeding - GI following. Hb overall stable, appears to have gastritis while on Aspirin post hip fracture. Continue PPI. Advance diet today. No plans for EGD  Sinus tachycardia/SVT - Cardiology was consulted, EP evaluated patient is well today. Start Metoprolol and Flecainide. Appreciate input.  - Last Echo on 01/14/2016 showed normal LV EF 21-30%Q grade I diastolic dysfunction, severe pulmonary HTN  Fall - unclear if patient had a syncopal episode vs pure mechanical fall vs opioid overdose, resolved, she is back to baseline today. ?related to #2  Exacerbation of chronic anemia associated with GI bleed (blood loss anemia) - So far hemoglobin is stable, continue to monitory H&H  Depression/anxiety - Continue Wellbutrin and  Xanax (prn)  Hyperthyroidism - Continue Methimazole. TSH 3.45, check fT3, fT4   DVT prophylaxis: SCD Code Status: Full code Family Communication: no family at bedside Disposition Plan: SNF 2-3 days   Consultants:    Cardiology   GI  Procedures:   None   Antimicrobials:  None    Subjective: - complains of being hungry  Objective: Vitals:   01/24/16 0609 01/24/16 0720 01/24/16 0726 01/24/16 0910  BP: (!) 129/56  112/62   Pulse: 100  (!) 155 (!) 102  Resp: (!) 31  (!) 25   Temp:   99.7 F (37.6 C)   TempSrc:   Axillary   SpO2: 96% 95% 99%   Weight:      Height:       No intake or output data in the 24 hours ending 01/24/16 1137 Filed Weights   01/24/16 0006  Weight: 59.9 kg (131 lb 15.8 oz)    Examination: Constitutional: NAD Vitals:   01/24/16 0609 01/24/16 0720 01/24/16 0726 01/24/16 0910  BP: (!) 129/56  112/62   Pulse: 100  (!) 155 (!) 102  Resp: (!) 31  (!) 25   Temp:   99.7 F (37.6 C)   TempSrc:   Axillary   SpO2: 96% 95% 99%   Weight:      Height:       Eyes: PERRL, lids and conjunctivae normal. Pale appearing ENMT: Mucous membranes are dry. Respiratory: clear to auscultation bilaterally, no wheezing, no crackles.  Cardiovascular: Regular rate and rhythm, no murmurs / rubs / gallops. tachycardic Abdomen: no tenderness. Bowel sounds positive.  Musculoskeletal: no clubbing / cyanosis.  Skin: no rashes, lesions, ulcers. No induration Neurologic: non focal. AxOx4   Data Reviewed: I have personally reviewed following labs and imaging studies  CBC:  Recent Labs Lab 01/23/16 0516 01/23/16 0846 01/23/16 1425 01/23/16 2147 01/24/16 0343 01/24/16 0853  WBC 9.0  --   --   --  12.8*  --   NEUTROABS 7.0  --   --   --  11.4*  --   HGB 8.6* 8.9* 9.9* 10.0* 9.4* 9.3*  HCT 28.6* 29.5* 32.9* 32.8* 30.2* 30.0*  MCV 99.3  --   --   --  95.6  --   PLT 177  --   --   --  204  --    Basic Metabolic Panel:  Recent Labs Lab 01/23/16 0245 01/23/16 0516 01/23/16 0846 01/24/16 0343  NA 142 141  --  140  K 4.1 4.1  --  3.6  CL 101 108  --  101  CO2  --  27  --  29  GLUCOSE 97 87  --  86  BUN 12 11  --  11  CREATININE 1.00 0.91  --  0.91  CALCIUM  --  7.7*   --  7.8*  MG  --   --  1.8 2.0  PHOS  --   --  3.4  --    GFR: Estimated Creatinine Clearance: 49.7 mL/min (by C-G formula based on SCr of 0.91 mg/dL). Liver Function Tests:  Recent Labs Lab 01/23/16 0846  AST 12*  ALT 10*  ALKPHOS 94  BILITOT 0.4  PROT 4.7*  ALBUMIN 1.7*   No results for input(s): LIPASE, AMYLASE in the last 168 hours. No results for input(s): AMMONIA in the last 168 hours. Coagulation Profile: No results for input(s): INR, PROTIME in the last 168 hours. Cardiac Enzymes:  Recent Labs Lab 01/23/16 0846 01/23/16 1425  TROPONINI <0.03 <0.03   BNP (last 3 results) No results for input(s): PROBNP in the last 8760 hours. HbA1C: No results for input(s): HGBA1C in the last 72 hours. CBG: No results for input(s): GLUCAP in the last 168 hours. Lipid Profile: No results for input(s): CHOL, HDL, LDLCALC, TRIG, CHOLHDL, LDLDIRECT in the last 72 hours. Thyroid Function Tests:  Recent Labs  01/23/16 1425  TSH 3.459   Anemia Panel: No results for input(s): VITAMINB12, FOLATE, FERRITIN, TIBC, IRON, RETICCTPCT in the last 72 hours. Urine analysis:    Component Value Date/Time   COLORURINE YELLOW 01/23/2016 [place] 01/23/2016 1223   LABSPEC 1.009 01/23/2016 1223   PHURINE 6.0 01/23/2016 1223   GLUCOSEU NEGATIVE 01/23/2016 1223   HGBUR NEGATIVE 01/23/2016 [place] 01/23/2016 1223   KETONESUR NEGATIVE 01/23/2016 1223   PROTEINUR NEGATIVE 01/23/2016 1223   UROBILINOGEN 0.2 04/29/2008 2235   NITRITE NEGATIVE 01/23/2016 1223   LEUKOCYTESUR NEGATIVE 01/23/2016 1223   Sepsis Labs: Invalid input(s): PROCALCITONIN, LACTICIDVEN  Recent Results (from the past 240 hour(s))  MRSA PCR Screening     Status: Abnormal   Collection Time: 01/23/16  3:00 PM  Result Value Ref Range Status   MRSA by PCR POSITIVE (A) NEGATIVE Final    Comment:        The GeneXpert MRSA Assay (FDA approved for NASAL specimens only), is one  component of a comprehensive MRSA colonization surveillance program. It is not intended to diagnose MRSA infection nor to guide or monitor treatment for MRSA infections. RESULT CALLED TO, READ BACK BY AND VERIFIED WITH: C COLOMBO,RN AT 1656 01/23/16 BY L BENFIELD       Radiology Studies: Dg Chest 2 View  Result Date: 01/23/2016 CLINICAL DATA:  Fall tonight. Cough.  Low oxygen saturation. Hypotension. Previous smoker. History of COPD. EXAM: CHEST  2 VIEW COMPARISON:  01/12/2016 FINDINGS: Cardiac enlargement with mild pulmonary vascular congestion. Interstitial changes in the lung bases may be due to chronic fibrosis or acute edema. Small bilateral pleural effusions. No focal consolidation. Emphysematous changes in the lungs. Calcification of the aorta. IMPRESSION: Cardiac enlargement with interstitial pattern possibly representing edema. Small bilateral pleural effusions. Emphysematous changes. Electronically Signed   By: Lucienne Capers M.D.   On: 01/23/2016 06:21   Ct Head Wo Contrast  Result Date: 01/23/2016 CLINICAL DATA:  Patient fell, striking back of head. Laceration to the occipital and right parietal region. Patient is lethargic. EXAM: CT HEAD WITHOUT CONTRAST CT CERVICAL SPINE WITHOUT CONTRAST TECHNIQUE: Multidetector CT imaging of the head and cervical spine was performed following the standard protocol without intravenous contrast. Multiplanar CT image reconstructions of the cervical spine were also generated. COMPARISON:  MRI brain 11/30/2015. CT cervical spine 11/30/2015. CT head 11/30/2015. FINDINGS: CT HEAD FINDINGS Brain: Diffuse cerebral atrophy. Ventricular dilatation consistent with central atrophy. Low-attenuation changes in the deep white matter consistent with small vessel ischemia. No abnormal extra-axial fluid collections. No mass effect or midline shift. Gray-white matter junctions are distinct. Basal cisterns are not effaced. No acute intracranial hemorrhage. Vascular:  Vascular calcifications. The basilar tip appears prominent. This may be due to tortuosity but could indicate an intracranial aneurysm, measuring 6 mm. No evidence of acute hemorrhage. Skull: Normal. Negative for fracture or focal lesion. Sinuses/Orbits: Mucosal thickening in the maxillary antra with partial opacification of the left maxillary antrum, similar to prior study. Mastoid air cells are not opacified. Old nasal bone deformities. Other: Subcutaneous scalp laceration over the posterior occipital region. CT CERVICAL SPINE FINDINGS Alignment: There is reversal of the usual cervical lordosis with slight retrolisthesis of C4 on C5 and C5 on C6. This is likely due to degenerative change. The alignment is unchanged since the prior study. Normal alignment of the facet joints. C1-2 articulation appears intact. Old osseous fragments over the tip of the odontoid process with loss of the anterior odontoid space, likely degenerative. Skull base and vertebrae: No acute fracture. No primary bone lesion or focal pathologic process. Soft tissues and spinal canal: No prevertebral fluid or swelling. No visible canal hematoma. Disc levels: Degenerative changes throughout the cervical spine with narrowed interspaces and endplate hypertrophic changes, most prominent at C3-4, C4-5, C5-6, and C6-7 levels. Prominent degenerative spurring in the facet joints and uncovertebral joints causing neural foraminal bony encroachment at multiple levels. Upper chest: Emphysematous changes and fibrosis in the lung apices. Other: Circumscribed hyperdense cystic structure demonstrated in the soft tissues of the posterior neck over the C3 spinous process. This likely represents a sebaceous cyst. No change since prior study. IMPRESSION: No acute intracranial abnormalities. Chronic atrophy and small vessel ischemic changes. 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 Consider follow-up with elective CTA or MRI to confirm. No acute hemorrhage. Degenerative  changes throughout the cervical spine. Reversal of cervical lordosis is unchanged since prior study and likely degenerative. No acute displaced fractures identified Electronically Signed   By: Lucienne Capers M.D.   On: 01/23/2016 04:53   Ct Cervical Spine Wo Contrast  Result Date: 01/23/2016 CLINICAL DATA:  Patient fell, striking back of head. Laceration to the occipital and right parietal region. Patient is lethargic. EXAM: CT HEAD WITHOUT CONTRAST CT CERVICAL SPINE WITHOUT CONTRAST TECHNIQUE: Multidetector CT imaging of the head and cervical spine was performed following the standard protocol without intravenous contrast.  Multiplanar CT image reconstructions of the cervical spine were also generated. COMPARISON:  MRI brain 11/30/2015. CT cervical spine 11/30/2015. CT head 11/30/2015. FINDINGS: CT HEAD FINDINGS Brain: Diffuse cerebral atrophy. Ventricular dilatation consistent with central atrophy. Low-attenuation changes in the deep white matter consistent with small vessel ischemia. No abnormal extra-axial fluid collections. No mass effect or midline shift. Gray-white matter junctions are distinct. Basal cisterns are not effaced. No acute intracranial hemorrhage. Vascular: Vascular calcifications. The basilar tip appears prominent. This may be due to tortuosity but could indicate an intracranial aneurysm, measuring 6 mm. No evidence of acute hemorrhage. Skull: Normal. Negative for fracture or focal lesion. Sinuses/Orbits: Mucosal thickening in the maxillary antra with partial opacification of the left maxillary antrum, similar to prior study. Mastoid air cells are not opacified. Old nasal bone deformities. Other: Subcutaneous scalp laceration over the posterior occipital region. CT CERVICAL SPINE FINDINGS Alignment: There is reversal of the usual cervical lordosis with slight retrolisthesis of C4 on C5 and C5 on C6. This is likely due to degenerative change. The alignment is unchanged since the prior study.  Normal alignment of the facet joints. C1-2 articulation appears intact. Old osseous fragments over the tip of the odontoid process with loss of the anterior odontoid space, likely degenerative. Skull base and vertebrae: No acute fracture. No primary bone lesion or focal pathologic process. Soft tissues and spinal canal: No prevertebral fluid or swelling. No visible canal hematoma. Disc levels: Degenerative changes throughout the cervical spine with narrowed interspaces and endplate hypertrophic changes, most prominent at C3-4, C4-5, C5-6, and C6-7 levels. Prominent degenerative spurring in the facet joints and uncovertebral joints causing neural foraminal bony encroachment at multiple levels. Upper chest: Emphysematous changes and fibrosis in the lung apices. Other: Circumscribed hyperdense cystic structure demonstrated in the soft tissues of the posterior neck over the C3 spinous process. This likely represents a sebaceous cyst. No change since prior study. IMPRESSION: No acute intracranial abnormalities. Chronic atrophy and small vessel ischemic changes. 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 Consider follow-up with elective CTA or MRI to confirm. No acute hemorrhage. Degenerative changes throughout the cervical spine. Reversal of cervical lordosis is unchanged since prior study and likely degenerative. No acute displaced fractures identified Electronically Signed   By: Lucienne Capers M.D.   On: 01/23/2016 04:53   Dg Chest Port 1 View  Result Date: 01/23/2016 CLINICAL DATA:  Hypoxia. EXAM: PORTABLE CHEST 1 VIEW COMPARISON:  Earlier today. FINDINGS: Stable enlarged cardiac silhouette. Mild increase in prominence of the pulmonary vasculature with stable prominence of the interstitial markings. Mild scoliosis. IMPRESSION: Stable cardiomegaly with mildly progressive pulmonary vascular congestion and stable interstitial pulmonary edema. Electronically Signed   By: Claudie Revering M.D.   On: 01/23/2016 13:45      Scheduled Meds: . Chlorhexidine Gluconate Cloth  6 each Topical Q0600  . digoxin  0.125 mg Oral Daily  . flecainide  50 mg Oral Q12H  . furosemide  20 mg Intravenous Q12H  . ipratropium  0.5 mg Nebulization TID  . levalbuterol  1.25 mg Nebulization TID  . metoprolol tartrate  25 mg Oral BID  . mupirocin ointment  1 application Nasal BID  . pantoprazole  40 mg Oral Q0600  . sodium chloride flush  3 mL Intravenous Q12H   Continuous Infusions:   Marzetta Board, MD, PhD [facility] Pager [phone]  If 7PM-7AM, please contact night-coverage [email or web address] Password [facility] 01/24/2016, 11:37 AM

## 2016-01-24 NOTE — Progress Notes (Signed)
Pt's heart rate is now sustaining between 110 and 130s with fewer and shorter runs of SVT. Still asymptomatic, pt is sleeping. Bp=106/65. Cardiology fellow Carotto notified and he ordered a one time dose of '5mg'$  IV metoprolol and to continue to monitor.

## 2016-01-25 DIAGNOSIS — W19XXXD Unspecified fall, subsequent encounter: Secondary | ICD-10-CM

## 2016-01-25 LAB — HEMOGLOBIN AND HEMATOCRIT, BLOOD
HCT: 27.5 % — ABNORMAL LOW (ref 36.0–46.0)
HEMATOCRIT: 31 % — AB (ref 36.0–46.0)
HEMATOCRIT: 28.7 % — AB (ref 36.0–46.0)
HEMOGLOBIN: 9.7 g/dL — AB (ref 12.0–15.0)
HEMOGLOBIN: 8.8 g/dL — AB (ref 12.0–15.0)
Hemoglobin: 8.6 g/dL — ABNORMAL LOW (ref 12.0–15.0)

## 2016-01-25 LAB — BASIC METABOLIC PANEL
ANION GAP: 9 (ref 5–15)
BUN: 9 mg/dL (ref 6–20)
CALCIUM: 7.6 mg/dL — AB (ref 8.9–10.3)
CO2: 35 mmol/L — ABNORMAL HIGH (ref 22–32)
Chloride: 94 mmol/L — ABNORMAL LOW (ref 101–111)
Creatinine, Ser: 0.98 mg/dL (ref 0.44–1.00)
GFR, EST NON AFRICAN AMERICAN: 55 mL/min — AB (ref >60)
Glucose, Bld: 91 mg/dL (ref 65–99)
POTASSIUM: 2.8 mmol/L — AB (ref 3.5–5.1)
SODIUM: 138 mmol/L (ref 135–145)

## 2016-01-25 LAB — T3, FREE: T3, Free: 0.9 pg/mL — ABNORMAL LOW (ref 2.0–4.4)

## 2016-01-25 LAB — CBC
HCT: 28.4 % — ABNORMAL LOW (ref 36.0–46.0)
Hemoglobin: 8.7 g/dL — ABNORMAL LOW (ref 12.0–15.0)
MCH: 29.1 pg (ref 26.0–34.0)
MCHC: 30.6 g/dL (ref 30.0–36.0)
MCV: 95 fL (ref 78.0–100.0)
PLATELETS: 187 10*3/uL (ref 150–400)
RBC: 2.99 MIL/uL — AB (ref 3.87–5.11)
RDW: 17.2 % — AB (ref 11.5–15.5)
WBC: 9.4 10*3/uL (ref 4.0–10.5)

## 2016-01-25 LAB — PROCALCITONIN: PROCALCITONIN: 0.45 ng/mL

## 2016-01-25 LAB — C DIFFICILE QUICK SCREEN W PCR REFLEX
C DIFFICLE (CDIFF) ANTIGEN: NEGATIVE
C Diff interpretation: NOT DETECTED
C Diff toxin: NEGATIVE

## 2016-01-25 LAB — GLUCOSE, CAPILLARY: GLUCOSE-CAPILLARY: 107 mg/dL — AB (ref 65–99)

## 2016-01-25 MED ORDER — FUROSEMIDE 20 MG PO TABS
20.0000 mg | ORAL_TABLET | Freq: Every day | ORAL | Status: DC
  Administered 2016-01-25: 20 mg via ORAL
  Filled 2016-01-25: qty 1

## 2016-01-25 MED ORDER — POTASSIUM CHLORIDE CRYS ER 20 MEQ PO TBCR
40.0000 meq | EXTENDED_RELEASE_TABLET | Freq: Once | ORAL | Status: DC
  Filled 2016-01-25: qty 2

## 2016-01-25 MED ORDER — METOPROLOL TARTRATE 12.5 MG HALF TABLET
12.5000 mg | ORAL_TABLET | Freq: Once | ORAL | Status: AC
  Administered 2016-01-25: 12.5 mg via ORAL
  Filled 2016-01-25: qty 1

## 2016-01-25 MED ORDER — LOPERAMIDE HCL 2 MG PO CAPS
2.0000 mg | ORAL_CAPSULE | Freq: Four times a day (QID) | ORAL | Status: DC | PRN
  Administered 2016-01-25 – 2016-01-26 (×3): 2 mg via ORAL
  Filled 2016-01-25 (×3): qty 1

## 2016-01-25 MED ORDER — MOMETASONE FURO-FORMOTEROL FUM 200-5 MCG/ACT IN AERO
2.0000 | INHALATION_SPRAY | Freq: Two times a day (BID) | RESPIRATORY_TRACT | Status: DC
  Administered 2016-01-25 – 2016-01-28 (×7): 2 via RESPIRATORY_TRACT
  Filled 2016-01-25: qty 8.8

## 2016-01-25 MED ORDER — POTASSIUM CHLORIDE CRYS ER 20 MEQ PO TBCR
40.0000 meq | EXTENDED_RELEASE_TABLET | ORAL | Status: AC
  Administered 2016-01-25 (×2): 40 meq via ORAL
  Filled 2016-01-25 (×2): qty 2

## 2016-01-25 MED ORDER — IPRATROPIUM BROMIDE 0.02 % IN SOLN
0.5000 mg | Freq: Four times a day (QID) | RESPIRATORY_TRACT | Status: DC
  Administered 2016-01-25 (×2): 0.5 mg via RESPIRATORY_TRACT
  Filled 2016-01-25 (×2): qty 2.5

## 2016-01-25 MED ORDER — LEVALBUTEROL HCL 1.25 MG/0.5ML IN NEBU
1.2500 mg | INHALATION_SOLUTION | Freq: Four times a day (QID) | RESPIRATORY_TRACT | Status: DC | PRN
  Administered 2016-01-26 – 2016-01-28 (×5): 1.25 mg via RESPIRATORY_TRACT
  Filled 2016-01-25 (×5): qty 0.5

## 2016-01-25 MED ORDER — IPRATROPIUM BROMIDE 0.02 % IN SOLN
0.5000 mg | Freq: Four times a day (QID) | RESPIRATORY_TRACT | Status: DC | PRN
  Administered 2016-01-28: 0.5 mg via RESPIRATORY_TRACT
  Filled 2016-01-25: qty 2.5

## 2016-01-25 NOTE — Plan of Care (Signed)
Problem: Bowel/Gastric: Goal: Will not experience complications related to bowel motility Outcome: Not Progressing Diarrhea continues, awaiting GI consult

## 2016-01-25 NOTE — Progress Notes (Signed)
PROGRESS NOTE  [name] JIR:[id number] DOB: March 15, 1940 DOA: 01/23/2016 PCP: [name], MD   LOS: 1 day   Brief Narrative: Dawn [name] is a 76 y.o. female with medical history significant of COPD, anemia, HTN, Hyperthyroidism, CKD stage III and hip surgery who presented to the Emergency Department from [facility] where she is recovering after her hip surgery. Patient fell when trying to get up off the toilet and hit the back of her head against the toilet.    Assessment & Plan: Principal Problem:   Fall Active Problems:   Anxiety   Chronic pain syndrome   COPD (chronic obstructive pulmonary disease) (HCC)   Hyperthyroidism   Hypotension   SVT (supraventricular tachycardia) (HCC)   Depression   Upper GI bleed   Blood loss anemia   Heme + stool   Abnormal CT scan, colon   Acute diastolic CHF (congestive heart failure) (HCC)   Paroxysmal junctional tachycardia (HCC)   Hypoxia   Concern for GI bleeding - GI following. Hb overall stable, appears to have gastritis while on Aspirin post hip fracture. Continue PPI. Advance diet. No plans for EGD  Diarrhea - patient with intermittent significant diarrhea for a while. CT scan done last night showed "complex anatomy in the region of the sigmoid colon with a very redundant / tortuous sigmoid segment and multiple areas that could represent paracolonic abscess". Clinically she does not appear to have an infection, her white count is normal, and she has no abdominal pain on exam. Appreciate gastroenterology expertise. C. difficile negative  Sinus tachycardia/SVT - Cardiology was consulted, EP evaluated patient. She was started on metoprolol and flecainide. Rate is overall improved, continue current regimen  Acute on chronic diastolic CHF - Most recent 2-D echo was done on 01/14/2016, showed normal ejection fraction of 50-55%. Chest x-ray on admission showed pulmonary vascular congestion, and she is clinically  fluid overloaded. Continue IV Lasix, monitor I's and O's as well as daily weights  Fall - unclear if patient had a syncopal episode vs pure mechanical fall vs opioid overdose, resolved, she is back to baseline today. ?related to her arrhythmia.  Exacerbation of chronic anemia associated with GI bleed (blood loss anemia) - So far hemoglobin is stable, continue to monitory H&H. No apparent plans for EGD  Depression/anxiety - Continue Wellbutrin and  Xanax (prn)  Hyperthyroidism - Continue Methimazole. TSH 3.45, check fT3, fT4  COPD - stable, no decompensation, Continue nebulizers   DVT prophylaxis: SCD Code Status: Full code Family Communication: no family at bedside Disposition Plan: SNF when ready   Consultants:   Cardiology   GI  Procedures:   None   Antimicrobials:  None    Subjective: - complains of being hungry  Objective: Vitals:   01/25/16 0737 01/25/16 0739 01/25/16 0741 01/25/16 1021  BP:   104/65   Pulse:   83 (!) 140  Resp:   17   Temp:   98.6 F (37 C)   TempSrc:   Oral   SpO2: (!) 84% 92% 92%   Weight:      Height:        Intake/Output Summary (Last 24 hours) at 01/25/16 1054 Last data filed at 01/25/16 0830  Gross per 24 hour  Intake                0 ml  Output              600 ml  Net             -  600 ml   Filed Weights   01/24/16 0006 01/25/16 0500  Weight: 59.9 kg (131 lb 15.8 oz) 64.2 kg (141 lb 9.6 oz)    Examination: Constitutional: NAD Vitals:   01/25/16 0737 01/25/16 0739 01/25/16 0741 01/25/16 1021  BP:   104/65   Pulse:   83 (!) 140  Resp:   17   Temp:   98.6 F (37 C)   TempSrc:   Oral   SpO2: (!) 84% 92% 92%   Weight:      Height:       Eyes: PERRL, lids and conjunctivae normal. Pale appearing ENMT: Mucous membranes are dry. Respiratory: cminimal wheezing, no crackles, moves air well  Cardiovascular: Regular rate and rhythm, no murmurs / rubs / gallops. tachycardic Abdomen: no tenderness. Bowel sounds positive.   Musculoskeletal: no clubbing / cyanosis.  Skin: no rashes, lesions, ulcers. No induration Neurologic: non focal. AxOx4  Data Reviewed: I have personally reviewed following labs and imaging studies  CBC:  Recent Labs Lab 01/23/16 0516  01/24/16 0343 01/24/16 0853 01/24/16 1449 01/24/16 2109 01/25/16 0252 01/25/16 0845  WBC 9.0  --  12.8*  --   --   --  9.4  --   NEUTROABS 7.0  --  11.4*  --   --   --   --   --   HGB 8.6*  < > 9.4* 9.3* 9.5* 9.9* 8.7* 9.7*  HCT 28.6*  < > 30.2* 30.0* 31.2* 32.0* 28.4* 31.0*  MCV 99.3  --  95.6  --   --   --  95.0  --   PLT 177  --  204  --   --   --  187  --   < > = values in this interval not displayed. Basic Metabolic Panel:  Recent Labs Lab 01/23/16 0245 01/23/16 0516 01/23/16 0846 01/24/16 0343 01/25/16 0252  NA 142 141  --  140 138  K 4.1 4.1  --  3.6 2.8*  CL 101 108  --  101 94*  CO2  --  27  --  29 35*  GLUCOSE 97 87  --  86 91  BUN 12 11  --  11 9  CREATININE 1.00 0.91  --  0.91 0.98  CALCIUM  --  7.7*  --  7.8* 7.6*  MG  --   --  1.8 2.0  --   PHOS  --   --  3.4  --   --    GFR: Estimated Creatinine Clearance: 49.3 mL/min (by C-G formula based on SCr of 0.98 mg/dL). Liver Function Tests:  Recent Labs Lab 01/23/16 0846  AST 12*  ALT 10*  ALKPHOS 94  BILITOT 0.4  PROT 4.7*  ALBUMIN 1.7*   Cardiac Enzymes:  Recent Labs Lab 01/23/16 0846 01/23/16 1425  TROPONINI <0.03 <0.03   Thyroid Function Tests:  Recent Labs  01/23/16 1425 01/24/16 1449  TSH 3.459  --   FREET4  --  0.61   Anemia Panel: No results for input(s): VITAMINB12, FOLATE, FERRITIN, TIBC, IRON, RETICCTPCT in the last 72 hours. Urine analysis:    Component Value Date/Time   COLORURINE YELLOW 01/23/2016 [place] 01/23/2016 1223   LABSPEC 1.009 01/23/2016 1223   PHURINE 6.0 01/23/2016 1223   GLUCOSEU NEGATIVE 01/23/2016 1223   HGBUR NEGATIVE 01/23/2016 [place] 01/23/2016 [place]  01/23/2016 1223   PROTEINUR NEGATIVE 01/23/2016 1223   UROBILINOGEN 0.2 04/29/2008 2235  NITRITE NEGATIVE 01/23/2016 [place] 01/23/2016 1223   Sepsis Labs: Invalid input(s): PROCALCITONIN, LACTICIDVEN  Recent Results (from the past 240 hour(s))  MRSA PCR Screening     Status: Abnormal   Collection Time: 01/23/16  3:00 PM  Result Value Ref Range Status   MRSA by PCR POSITIVE (A) NEGATIVE Final    Comment:        The GeneXpert MRSA Assay (FDA approved for NASAL specimens only), is one component of a comprehensive MRSA colonization surveillance program. It is not intended to diagnose MRSA infection nor to guide or monitor treatment for MRSA infections. RESULT CALLED TO, READ BACK BY AND VERIFIED WITH: C COLOMBO,RN AT 1656 01/23/16 BY L BENFIELD       Radiology Studies: Ct Abdomen Pelvis W Contrast  Result Date: 01/24/2016 CLINICAL DATA:  Colon stricture EXAM: CT ABDOMEN AND PELVIS WITH CONTRAST TECHNIQUE: Multidetector CT imaging of the abdomen and pelvis was performed using the standard protocol following bolus administration of intravenous contrast. CONTRAST:  156m ISOVUE-300 IOPAMIDOL (ISOVUE-300) INJECTION 61% COMPARISON:  11/17/2015 FINDINGS: Lower chest: Emphysema with bibasilar atelectasis and small bilateral pleural effusions. Heart is enlarged. Hepatobiliary: No focal abnormality within the liver parenchyma. Small area of low attenuation in the anterior liver, adjacent to the falciform ligament, is in a characteristic location for focal fatty deposition. Gallbladder are unremarkable. No intrahepatic or extrahepatic biliary dilation. Pancreas: No focal mass lesion. No dilatation of the main duct. No intraparenchymal cyst. No peripancreatic edema. Spleen: No splenomegaly. No focal mass lesion. Adrenals/Urinary Tract: No adrenal nodule or mass. Bilateral renal cysts again noted, measuring up to 7.2 cm in the left kidney. Mild fullness noted both intrarenal  collecting systems without overt hydroureter. Circumferential bladder wall thickening is noted. Stomach/Bowel: Stomach is nondistended. No gastric wall thickening. No evidence of outlet obstruction. Duodenum is normally positioned as is the ligament of Treitz. No small bowel wall thickening. No small bowel dilatation. The terminal ileum is normal. The appendix is normal. Abnormal sigmoid colon again noted. Previous exam showed a 4.2 x 3.2 cm "Mass" between the sigmoid colon in the pelvic sidewall. This has decreased in the interval now measuring 3.4 x 2.0 cm, but contains heterogeneous attenuation and gas. Medial to the sigmoid colon is another abnormal structure measuring 6.0 x 2.9 cm, also with mixed attenuation and gas. The sigmoid colon is so tortuous and matted down in this region is difficult to determine what is sigmoid colon and what may be para colonic mass or abscess. There is a collection of stool appearing material between the cervix and the rectum that cannot be confirmed to be intraluminal. Vascular/Lymphatic: There is abdominal aortic atherosclerosis without aneurysm. There is no gastrohepatic or hepatoduodenal ligament lymphadenopathy. No intraperitoneal or retroperitoneal lymphadenopathy. No pelvic sidewall lymphadenopathy. Reproductive: There appears to be marked wall thickening in the vagina, but the areas not well seen secondary to substantial streak artifact from the right hip replacement. Musculoskeletal: There is prominent perirectal edema. Diffuse body wall edema is seen throughout the visualized portion of the abdomen and pelvis. Patient is status post right hip hemiarthroplasty. IMPRESSION: 1. Complex anatomy in the region of the sigmoid colon with a very redundant/tortuous sigmoid segment and multiple areas that could represent paracolonic abscess. There is material that appears to be stool in the cul-de-sac region that cannot be confirmed to be intraluminal as the rectum appears to pass  posterior to this collection. Colonoscopy back in November was unsuccessful at passing through this area of  colon. CT imaging through the pelvis after allowing for migration of oral contrast into the distal rectum may prove helpful. Alternatively, repeat CT before and after administration of rectal water-soluble contrast may prove helpful. This anatomy may be very difficult to tease out by traditional fluoroscopic contrast enema, but that also might be an option. Diverticulitis with abscess and neoplasm could both have this appearance. 2. Diffuse body wall edema 3. Advanced abdominal aortic atherosclerosis. 4. Mild bilateral hydronephrosis with out substantial ureteral dilatation. There does appear to be circumferential bladder wall thickening. 5. Marked vaginal wall thickening. Electronically Signed   By: Misty Stanley M.D.   On: 01/24/2016 20:52   Dg Chest Port 1 View  Result Date: 01/23/2016 CLINICAL DATA:  Hypoxia. EXAM: PORTABLE CHEST 1 VIEW COMPARISON:  Earlier today. FINDINGS: Stable enlarged cardiac silhouette. Mild increase in prominence of the pulmonary vasculature with stable prominence of the interstitial markings. Mild scoliosis. IMPRESSION: Stable cardiomegaly with mildly progressive pulmonary vascular congestion and stable interstitial pulmonary edema. Electronically Signed   By: Claudie Revering M.D.   On: 01/23/2016 13:45   Scheduled Meds: . Chlorhexidine Gluconate Cloth  6 each Topical Q0600  . digoxin  0.125 mg Oral Daily  . flecainide  50 mg Oral Q12H  . furosemide  20 mg Intravenous Q12H  . ipratropium  0.5 mg Nebulization BID  . levalbuterol  1.25 mg Nebulization BID  . metoprolol tartrate  25 mg Oral BID  . mupirocin ointment  1 application Nasal BID  . pantoprazole  40 mg Oral Q0600  . potassium chloride  40 mEq Oral Q3H  . sodium chloride flush  3 mL Intravenous Q12H   Continuous Infusions:   Marzetta Board, MD, PhD [facility] Pager [phone]  If 7PM-7AM,  please contact night-coverage [email or web address] Password [facility] 01/25/2016, 10:54 AM

## 2016-01-25 NOTE — Progress Notes (Signed)
Called Tylene Fantasia (on call) for BP 90/50 manually and ordered to give Metoprolol 12.'5mg'$  PO instead of '25mg'$ . Will continue to monitor pt.   01/25/16 2032  Vitals  BP (!) 90/50  BP Location Left Arm  BP Method Manual  Patient Position (if appropriate) Lying  Pain Assessment  Pain Assessment 0-10  Pain Score 8  Faces Pain Scale 6  Pain Type Chronic pain  Pain Location Back  Pain Orientation Lower;Mid  Pain Descriptors / Indicators Aching  Pain Onset On-going  Pain Intervention(s) Medication (See eMAR)  Multiple Pain Sites No

## 2016-01-25 NOTE — Progress Notes (Addendum)
UNASSIGNED PATIENT Subjective: Patient seen and examined chart reviewed. CT scan of the abdomen and pelvis done yesterday reveals complex anatomy in the region of the sigmoid colon with a very tortuous segment in multiple areas that could represent paracolonic abscesses . T on exam patient denies any GI symptoms at the present time. The possibility of diverticulitis with abscesses versus neoplasm was raised. On exam the patient denies any GI symptoms at the present time. Patient says she is having a hard time breathing and wants to have her rest and see the respiratory therapist  Objective: Vital signs in last 24 hours: Temp:  [98.4 F (36.9 C)-99.8 F (37.7 C)] 98.4 F (36.9 C) (01/15 1122) Pulse Rate:  [72-140] 78 (01/15 1122) Resp:  [17-26] 22 (01/15 1122) BP: (95-122)/(48-67) 100/54 (01/15 1122) SpO2:  [84 %-98 %] 96 % (01/15 1122) Weight:  [64.2 kg (141 lb 9.6 oz)] 64.2 kg (141 lb 9.6 oz) (01/15 0500) Last BM Date: 01/25/16  Intake/Output from previous day: 01/14 0701 - 01/15 0700 In: -  Out: 300 [Urine:300] Intake/Output this shift: Total I/O In: -  Out: 300 [Urine:300]  General appearance: appears stated age, cachectic, fatigued, no distress and pale Resp: clear to auscultation bilaterally Cardio: regular rate and rhythm, S1, S2 normal, tachycardic, no murmur, click, rub or gallop GI: soft, non-tender; bowel sounds normal; no masses,  no organomegaly Extremities: extremities normal, atraumatic, no cyanosis or edema  Lab Results:  Recent Labs  01/23/16 0516  01/24/16 0343  01/24/16 2109 01/25/16 0252 01/25/16 0845  WBC 9.0  --  12.8*  --   --  9.4  --   HGB 8.6*  < > 9.4*  < > 9.9* 8.7* 9.7*  HCT 28.6*  < > 30.2*  < > 32.0* 28.4* 31.0*  PLT 177  --  204  --   --  187  --   < > = values in this interval not displayed. BMET  Recent Labs  01/23/16 0516 01/24/16 0343 01/25/16 0252  NA 141 140 138  K 4.1 3.6 2.8*  CL 108 101 94*  CO2 27 29 35*  GLUCOSE 87 86  91  BUN '11 11 9  '$ CREATININE 0.91 0.91 0.98  CALCIUM 7.7* 7.8* 7.6*   LFT  Recent Labs  01/23/16 0846  PROT 4.7*  ALBUMIN 1.7*  AST 12*  ALT 10*  ALKPHOS 94  BILITOT 0.4  BILIDIR 0.1  IBILI 0.3    Recent Labs  01/25/16 1035  CDIFFTOX NEGATIVE   Studies/Results: Ct Abdomen Pelvis W Contrast  Result Date: 01/24/2016 CLINICAL DATA:  Colon stricture EXAM: CT ABDOMEN AND PELVIS WITH CONTRAST TECHNIQUE: Multidetector CT imaging of the abdomen and pelvis was performed using the standard protocol following bolus administration of intravenous contrast. CONTRAST:  154m ISOVUE-300 IOPAMIDOL (ISOVUE-300) INJECTION 61% COMPARISON:  11/17/2015 FINDINGS: Lower chest: Emphysema with bibasilar atelectasis and small bilateral pleural effusions. Heart is enlarged. Hepatobiliary: No focal abnormality within the liver parenchyma. Small area of low attenuation in the anterior liver, adjacent to the falciform ligament, is in a characteristic location for focal fatty deposition. Gallbladder are unremarkable. No intrahepatic or extrahepatic biliary dilation. Pancreas: No focal mass lesion. No dilatation of the main duct. No intraparenchymal cyst. No peripancreatic edema. Spleen: No splenomegaly. No focal mass lesion. Adrenals/Urinary Tract: No adrenal nodule or mass. Bilateral renal cysts again noted, measuring up to 7.2 cm in the left kidney. Mild fullness noted both intrarenal collecting systems without overt hydroureter. Circumferential bladder wall thickening is  noted. Stomach/Bowel: Stomach is nondistended. No gastric wall thickening. No evidence of outlet obstruction. Duodenum is normally positioned as is the ligament of Treitz. No small bowel wall thickening. No small bowel dilatation. The terminal ileum is normal. The appendix is normal. Abnormal sigmoid colon again noted. Previous exam showed a 4.2 x 3.2 cm "Mass" between the sigmoid colon in the pelvic sidewall. This has decreased in the interval now  measuring 3.4 x 2.0 cm, but contains heterogeneous attenuation and gas. Medial to the sigmoid colon is another abnormal structure measuring 6.0 x 2.9 cm, also with mixed attenuation and gas. The sigmoid colon is so tortuous and matted down in this region is difficult to determine what is sigmoid colon and what may be para colonic mass or abscess. There is a collection of stool appearing material between the cervix and the rectum that cannot be confirmed to be intraluminal. Vascular/Lymphatic: There is abdominal aortic atherosclerosis without aneurysm. There is no gastrohepatic or hepatoduodenal ligament lymphadenopathy. No intraperitoneal or retroperitoneal lymphadenopathy. No pelvic sidewall lymphadenopathy. Reproductive: There appears to be marked wall thickening in the vagina, but the areas not well seen secondary to substantial streak artifact from the right hip replacement. Musculoskeletal: There is prominent perirectal edema. Diffuse body wall edema is seen throughout the visualized portion of the abdomen and pelvis. Patient is status post right hip hemiarthroplasty. IMPRESSION: 1. Complex anatomy in the region of the sigmoid colon with a very redundant/tortuous sigmoid segment and multiple areas that could represent paracolonic abscess. There is material that appears to be stool in the cul-de-sac region that cannot be confirmed to be intraluminal as the rectum appears to pass posterior to this collection. Colonoscopy back in November was unsuccessful at passing through this area of colon. CT imaging through the pelvis after allowing for migration of oral contrast into the distal rectum may prove helpful. Alternatively, repeat CT before and after administration of rectal water-soluble contrast may prove helpful. This anatomy may be very difficult to tease out by traditional fluoroscopic contrast enema, but that also might be an option. Diverticulitis with abscess and neoplasm could both have this appearance.  2. Diffuse body wall edema 3. Advanced abdominal aortic atherosclerosis. 4. Mild bilateral hydronephrosis with out substantial ureteral dilatation. There does appear to be circumferential bladder wall thickening. 5. Marked vaginal wall thickening. Electronically Signed   By: Misty Stanley M.D.   On: 01/24/2016 20:52    Medications: I have reviewed the patient's current medications.  Assessment/Plan: 1) Blood in stool with iron deficiency anemia and an abnormal CT scan with ?sigmoid colon diverticulitis with abscesses vs neoplasm.  As per my discussion with Dr. Letta Median, plans are to repeat the CT scan in 24 hours and make further recommendations thereafter. If the repeat CT is not of much help administration of rectal rectal contrast and repeat imaging might be helpful. 2) Severe malnutrition-needs a dietary consult.   LOS: 1 day   Welton Bord 01/25/2016, 1:46 PM

## 2016-01-25 NOTE — Progress Notes (Addendum)
Patient Name: Dawn [name] Date of Encounter: 01/25/2016  Primary Cardiologist: Dr. Laurena Bering Problem List     Principal Problem:   Fall Active Problems:   Anxiety   Chronic pain syndrome   COPD (chronic obstructive pulmonary disease) (HCC)   Hyperthyroidism   Hypotension   SVT (supraventricular tachycardia) (HCC)   Depression   Upper GI bleed   Blood loss anemia   Heme + stool   Abnormal CT scan, colon   Acute diastolic CHF (congestive heart failure) (HCC)   Paroxysmal junctional tachycardia (HCC)   Hypoxia   Subjective   Somewhat dyspneic this morning. Feels weak all over.   Inpatient Medications    Scheduled Meds: . Chlorhexidine Gluconate Cloth  6 each Topical Q0600  . digoxin  0.125 mg Oral Daily  . flecainide  50 mg Oral Q12H  . furosemide  20 mg Intravenous Q12H  . ipratropium  0.5 mg Nebulization BID  . levalbuterol  1.25 mg Nebulization BID  . metoprolol tartrate  25 mg Oral BID  . mupirocin ointment  1 application Nasal BID  . pantoprazole  40 mg Oral Q0600  . sodium chloride flush  3 mL Intravenous Q12H   Continuous Infusions:  PRN Meds: acetaminophen, bisacodyl, [DISCONTINUED] ondansetron **OR** ondansetron (ZOFRAN) IV, oxyCODONE-acetaminophen   Vital Signs    Vitals:   01/25/16 0500 01/25/16 0737 01/25/16 0739 01/25/16 0741  BP: 104/65   104/65  Pulse:    83  Resp: (!) 23   17  Temp: 98.9 F (37.2 C)   98.6 F (37 C)  TempSrc: Oral   Oral  SpO2: 95% (!) 84% 92% 92%  Weight: 141 lb 9.6 oz (64.2 kg)     Height:        Intake/Output Summary (Last 24 hours) at 01/25/16 0900 Last data filed at 01/24/16 2102  Gross per 24 hour  Intake                0 ml  Output              300 ml  Net             -300 ml   Filed Weights   01/24/16 0006 01/25/16 0500  Weight: 131 lb 15.8 oz (59.9 kg) 141 lb 9.6 oz (64.2 kg)    Physical Exam    GEN: Well nourished, well developed, in no acute distress.  HEENT: Grossly normal.   Neck: Supple, no JVD, carotid bruits, or masses. Cardiac: RRR, no murmurs, rubs, or gallops. No clubbing, cyanosis, edema.  Radials/DP/PT 2+ and equal bilaterally.  Respiratory:  Respirations regular and unlabored, clear to auscultation bilaterally. GI: Soft, nontender, nondistended, BS + x 4. MS: no deformity or atrophy. Skin: warm and dry, no rash. Neuro:  Strength and sensation are intact. Psych: AAOx3.  Normal affect.  Labs    CBC  Recent Labs  01/23/16 0516  01/24/16 0343  01/24/16 2109 01/25/16 0252  WBC 9.0  --  12.8*  --   --  9.4  NEUTROABS 7.0  --  11.4*  --   --   --   HGB 8.6*  < > 9.4*  < > 9.9* 8.7*  HCT 28.6*  < > 30.2*  < > 32.0* 28.4*  MCV 99.3  --  95.6  --   --  95.0  PLT 177  --  204  --   --  187  < > = values in this interval  not displayed. Basic Metabolic Panel  Recent Labs  01/23/16 0846 01/24/16 0343 01/25/16 0252  NA  --  140 138  K  --  3.6 2.8*  CL  --  101 94*  CO2  --  29 35*  GLUCOSE  --  86 91  BUN  --  11 9  CREATININE  --  0.91 0.98  CALCIUM  --  7.8* 7.6*  MG 1.8 2.0  --   PHOS 3.4  --   --    Liver Function Tests  Recent Labs  01/23/16 0846  AST 12*  ALT 10*  ALKPHOS 94  BILITOT 0.4  PROT 4.7*  ALBUMIN 1.7*    Recent Labs  01/23/16 0846 01/23/16 1425  TROPONINI <0.03 <0.03    Recent Labs  01/23/16 1425  TSH 3.459    Telemetry    SR with freq PVCs, PACs - Personally Reviewed  ECG    ST - Personally Reviewed  Radiology    Ct Abdomen Pelvis W Contrast  Result Date: 01/24/2016 CLINICAL DATA:  Colon stricture EXAM: CT ABDOMEN AND PELVIS WITH CONTRAST TECHNIQUE: Multidetector CT imaging of the abdomen and pelvis was performed using the standard protocol following bolus administration of intravenous contrast. CONTRAST:  135m ISOVUE-300 IOPAMIDOL (ISOVUE-300) INJECTION 61% COMPARISON:  11/17/2015 FINDINGS: Lower chest: Emphysema with bibasilar atelectasis and small bilateral pleural effusions. Heart is  enlarged. Hepatobiliary: No focal abnormality within the liver parenchyma. Small area of low attenuation in the anterior liver, adjacent to the falciform ligament, is in a characteristic location for focal fatty deposition. Gallbladder are unremarkable. No intrahepatic or extrahepatic biliary dilation. Pancreas: No focal mass lesion. No dilatation of the main duct. No intraparenchymal cyst. No peripancreatic edema. Spleen: No splenomegaly. No focal mass lesion. Adrenals/Urinary Tract: No adrenal nodule or mass. Bilateral renal cysts again noted, measuring up to 7.2 cm in the left kidney. Mild fullness noted both intrarenal collecting systems without overt hydroureter. Circumferential bladder wall thickening is noted. Stomach/Bowel: Stomach is nondistended. No gastric wall thickening. No evidence of outlet obstruction. Duodenum is normally positioned as is the ligament of Treitz. No small bowel wall thickening. No small bowel dilatation. The terminal ileum is normal. The appendix is normal. Abnormal sigmoid colon again noted. Previous exam showed a 4.2 x 3.2 cm "Mass" between the sigmoid colon in the pelvic sidewall. This has decreased in the interval now measuring 3.4 x 2.0 cm, but contains heterogeneous attenuation and gas. Medial to the sigmoid colon is another abnormal structure measuring 6.0 x 2.9 cm, also with mixed attenuation and gas. The sigmoid colon is so tortuous and matted down in this region is difficult to determine what is sigmoid colon and what may be para colonic mass or abscess. There is a collection of stool appearing material between the cervix and the rectum that cannot be confirmed to be intraluminal. Vascular/Lymphatic: There is abdominal aortic atherosclerosis without aneurysm. There is no gastrohepatic or hepatoduodenal ligament lymphadenopathy. No intraperitoneal or retroperitoneal lymphadenopathy. No pelvic sidewall lymphadenopathy. Reproductive: There appears to be marked wall thickening  in the vagina, but the areas not well seen secondary to substantial streak artifact from the right hip replacement. Musculoskeletal: There is prominent perirectal edema. Diffuse body wall edema is seen throughout the visualized portion of the abdomen and pelvis. Patient is status post right hip hemiarthroplasty. IMPRESSION: 1. Complex anatomy in the region of the sigmoid colon with a very redundant/tortuous sigmoid segment and multiple areas that could represent paracolonic abscess. There is material  that appears to be stool in the cul-de-sac region that cannot be confirmed to be intraluminal as the rectum appears to pass posterior to this collection. Colonoscopy back in November was unsuccessful at passing through this area of colon. CT imaging through the pelvis after allowing for migration of oral contrast into the distal rectum may prove helpful. Alternatively, repeat CT before and after administration of rectal water-soluble contrast may prove helpful. This anatomy may be very difficult to tease out by traditional fluoroscopic contrast enema, but that also might be an option. Diverticulitis with abscess and neoplasm could both have this appearance. 2. Diffuse body wall edema 3. Advanced abdominal aortic atherosclerosis. 4. Mild bilateral hydronephrosis with out substantial ureteral dilatation. There does appear to be circumferential bladder wall thickening. 5. Marked vaginal wall thickening. Electronically Signed   By: Misty Stanley M.D.   On: 01/24/2016 20:52   Dg Chest Port 1 View  Result Date: 01/23/2016 CLINICAL DATA:  Hypoxia. EXAM: PORTABLE CHEST 1 VIEW COMPARISON:  Earlier today. FINDINGS: Stable enlarged cardiac silhouette. Mild increase in prominence of the pulmonary vasculature with stable prominence of the interstitial markings. Mild scoliosis. IMPRESSION: Stable cardiomegaly with mildly progressive pulmonary vascular congestion and stable interstitial pulmonary edema. Electronically Signed   By:  Claudie Revering M.D.   On: 01/23/2016 13:45    Cardiac Studies   None  Patient Profile     76 yo female with PMH of  COPD, anemia, HTN, Hyperthyroidism, CKD stage III andhip surgery who presented to the ED after a fall and noted to have runs of SVT.    Assessment & Plan    1. SVT - Interviewing telemetry and her EKGs, she is in sinus tachycardia most of the time, but does have episodes of SVT with rates close to 150 bpm. At times it appears that these episodes are due to atrial tachycardia, but there are also times P waves are difficult to distinguish. -- she was started on metoprolol 25 mg as well as flecainide yesterday, HR seems to have improved today. She will need outpatient Myoview to see if she does have any evidence for ischemia.   2. Acute diastolic CHF - somewhat dyspneic this morning. Just received a treatment with some improvement.  -- remains on IV lasix '20mg'$  BID, weight inaccurate, I&Os with no documented output yesterday. Would continue for now as she is still dyspneic. Cr stable.   3. S/p Fall - Head CT neg for acute bleed. She denies syncope.   4. Hypotension - blood pressure much improved.   5. Heme + stool - Per primary service, GI following. Hgb drop 9.9>>8.7 today.  6. Hypokalemia - replaced.   Signed, Reino Bellis, NP  01/25/2016, 9:00 AM   The patient was seen, examined and discussed with Reino Bellis, NP-C and I agree with the above.   Dawn [name] is a 76 y.o. female with a hx of with a hx of COPD, hyperthyroidism.  She was noted to have SVT during an admission for urosepsis in 11/2015.  The monitor is not completed yet but there are notes in the chart that an episode of SVT was noted 12/22.  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  No strips are available for review in her chart.  There are several ECGs with SR or ST with very frequent PACs in a pattern of bigeminy. Medical Rx was continued with  beta-blocker therapy. She has been experiencing palpitations at home  and Dr Tamala Julian was planning to start a 30 day event monitor.   She was then admitted 1/1-1/5 with R hip fracture after suffering a fall.  She underwent hemiarthroplasty and was DC'd to [facility] for rehab.  She suffered a fall today at the SNF and struck her head on the toilet.  While in the hospital telemetry shows frequent paroxysmal SVTs with HR 140 BPM. She was seen by Dr Edison Simon yesterday and was started on metoprolol and flecainide. She had 4 very brief episodes this am that were asymptomatic. We would continue the same regimen.  She now appears euvolemic, I will switch iv lasix to lasix 20 mg po daily,however wheezing, she has h/o COPD and uses inhalers at home, but was not getting them here , I will start breathing treatments and advair BID. I will replace potassium.   Ena Dawley, MD 01/25/2016

## 2016-01-25 NOTE — Telephone Encounter (Signed)
Unfortunately, the hospital staff removed the patients monitor prior to the period of time ordered by the MD.  They also did not inform the monitor company of this. I have reached out to the monitor company representative.  He is going to see what he can do for an extension, however, insurance may dictate the monitor period of 30 days from monitor application.  Dawn [name] had the monitor applied on 12/30/16.  It was removed by the hospital 01/11/16, so she did begin the initial period prior to hospitalization.

## 2016-01-26 ENCOUNTER — Inpatient Hospital Stay (HOSPITAL_COMMUNITY): Payer: Medicare Other

## 2016-01-26 LAB — BASIC METABOLIC PANEL
Anion gap: 7 (ref 5–15)
BUN: 17 mg/dL (ref 6–20)
CO2: 34 mmol/L — ABNORMAL HIGH (ref 22–32)
Calcium: 7.8 mg/dL — ABNORMAL LOW (ref 8.9–10.3)
Chloride: 95 mmol/L — ABNORMAL LOW (ref 101–111)
Creatinine, Ser: 0.98 mg/dL (ref 0.44–1.00)
GFR calc Af Amer: >60 mL/min (ref >60)
GFR calc non Af Amer: 55 mL/min — ABNORMAL LOW (ref >60)
Glucose, Bld: 136 mg/dL — ABNORMAL HIGH (ref 65–99)
Potassium: 3.7 mmol/L (ref 3.5–5.1)
Sodium: 136 mmol/L (ref 135–145)

## 2016-01-26 LAB — HEMOGLOBIN AND HEMATOCRIT, BLOOD
HCT: 26.9 % — ABNORMAL LOW (ref 36.0–46.0)
HCT: 27.5 % — ABNORMAL LOW (ref 36.0–46.0)
HCT: 27.8 % — ABNORMAL LOW (ref 36.0–46.0)
HEMATOCRIT: 27.8 % — AB (ref 36.0–46.0)
HEMOGLOBIN: 8.4 g/dL — AB (ref 12.0–15.0)
Hemoglobin: 8.1 g/dL — ABNORMAL LOW (ref 12.0–15.0)
Hemoglobin: 8.4 g/dL — ABNORMAL LOW (ref 12.0–15.0)
Hemoglobin: 8.6 g/dL — ABNORMAL LOW (ref 12.0–15.0)

## 2016-01-26 MED ORDER — ENSURE ENLIVE PO LIQD
237.0000 mL | Freq: Two times a day (BID) | ORAL | Status: DC
  Administered 2016-01-26 – 2016-01-27 (×2): 237 mL via ORAL

## 2016-01-26 MED ORDER — FUROSEMIDE 20 MG PO TABS
20.0000 mg | ORAL_TABLET | Freq: Once | ORAL | Status: AC
  Administered 2016-01-26: 20 mg via ORAL
  Filled 2016-01-26: qty 1

## 2016-01-26 MED ORDER — ALPRAZOLAM 0.5 MG PO TABS
0.5000 mg | ORAL_TABLET | Freq: Two times a day (BID) | ORAL | Status: DC | PRN
  Administered 2016-01-26 – 2016-01-28 (×6): 0.5 mg via ORAL
  Filled 2016-01-26 (×6): qty 1

## 2016-01-26 MED ORDER — LEVALBUTEROL HCL 0.63 MG/3ML IN NEBU
0.6300 mg | INHALATION_SOLUTION | Freq: Two times a day (BID) | RESPIRATORY_TRACT | Status: DC
  Administered 2016-01-26 – 2016-01-28 (×4): 0.63 mg via RESPIRATORY_TRACT
  Filled 2016-01-26 (×5): qty 3

## 2016-01-26 MED ORDER — IOPAMIDOL (ISOVUE-300) INJECTION 61%
INTRAVENOUS | Status: AC
  Administered 2016-01-26: 30 mL
  Filled 2016-01-26: qty 30

## 2016-01-26 MED ORDER — HYPROMELLOSE (GONIOSCOPIC) 2.5 % OP SOLN
1.0000 [drp] | Freq: Three times a day (TID) | OPHTHALMIC | Status: DC | PRN
  Administered 2016-01-26: 1 [drp] via OPHTHALMIC
  Filled 2016-01-26 (×2): qty 15

## 2016-01-26 MED ORDER — IOHEXOL 350 MG/ML SOLN: 15.0000 mL | INTRAVENOUS | Status: AC

## 2016-01-26 NOTE — Progress Notes (Signed)
Patient Name: Dawn [name] Date of Encounter: 01/26/2016  Primary Cardiologist: Dr. Laurena Bering Problem List     Principal Problem:   Fall Active Problems:   Anxiety   Chronic pain syndrome   COPD (chronic obstructive pulmonary disease) (HCC)   Hyperthyroidism   Hypotension   SVT (supraventricular tachycardia) (HCC)   Depression   Upper GI bleed   Blood loss anemia   Heme + stool   Abnormal CT scan, colon   Acute diastolic CHF (congestive heart failure) (HCC)   Paroxysmal junctional tachycardia (HCC)   Hypoxia   Subjective   Feels better, no palpitations.  Inpatient Medications    Scheduled Meds: . Chlorhexidine Gluconate Cloth  6 each Topical Q0600  . digoxin  0.125 mg Oral Daily  . flecainide  50 mg Oral Q12H  . levalbuterol  0.63 mg Nebulization BID  . metoprolol tartrate  25 mg Oral BID  . mometasone-formoterol  2 puff Inhalation BID  . mupirocin ointment  1 application Nasal BID  . pantoprazole  40 mg Oral Q0600  . potassium chloride  40 mEq Oral Once  . sodium chloride flush  3 mL Intravenous Q12H   Continuous Infusions:  PRN Meds: acetaminophen, ALPRAZolam, bisacodyl, hydroxypropyl methylcellulose / hypromellose, ipratropium, levalbuterol, loperamide, [DISCONTINUED] ondansetron **OR** ondansetron (ZOFRAN) IV, oxyCODONE-acetaminophen   Vital Signs    Vitals:   01/26/16 0808 01/26/16 0810 01/26/16 0844 01/26/16 1228  BP:   (!) 111/57   Pulse: 71  84 83  Resp: 18   16  Temp: 97.8 F (36.6 C)   98 F (36.7 C)  TempSrc: Oral   Oral  SpO2: 97% 97%  98%  Weight:      Height:        Intake/Output Summary (Last 24 hours) at 01/26/16 1235 Last data filed at 01/26/16 1043  Gross per 24 hour  Intake              360 ml  Output              220 ml  Net              140 ml   Filed Weights   01/24/16 0006 01/25/16 0500 01/26/16 0318  Weight: 131 lb 15.8 oz (59.9 kg) 141 lb 9.6 oz (64.2 kg) 134 lb 9.6 oz (61.1 kg)    Physical Exam      GEN: Well nourished, well developed, in no acute distress.  HEENT: Grossly normal.  Neck: Supple, no JVD, carotid bruits, or masses. Cardiac: RRR, no murmurs, rubs, or gallops. No clubbing, cyanosis, edema.  Radials/DP/PT 2+ and equal bilaterally.  Respiratory:  Respirations regular and unlabored, clear to auscultation bilaterally. GI: Soft, nontender, nondistended, BS + x 4. MS: no deformity or atrophy. Skin: warm and dry, no rash. Neuro:  Strength and sensation are intact. Psych: AAOx3.  Normal affect.  Labs    CBC  Recent Labs  01/24/16 0343  01/25/16 0252  01/26/16 0254 01/26/16 0957  WBC 12.8*  --  9.4  --   --   --   NEUTROABS 11.4*  --   --   --   --   --   HGB 9.4*  < > 8.7*  < > 8.1* 8.4*  HCT 30.2*  < > 28.4*  < > 26.9* 27.5*  MCV 95.6  --  95.0  --   --   --   PLT 204  --  187  --   --   --   < > =  values in this interval not displayed. Basic Metabolic Panel  Recent Labs  01/24/16 0343 01/25/16 0252  NA 140 138  K 3.6 2.8*  CL 101 94*  CO2 29 35*  GLUCOSE 86 91  BUN 11 9  CREATININE 0.91 0.98  CALCIUM 7.8* 7.6*  MG 2.0  --    Liver Function Tests No results for input(s): AST, ALT, ALKPHOS, BILITOT, PROT, ALBUMIN in the last 72 hours.  Recent Labs  01/23/16 1425  TROPONINI <0.03    Recent Labs  01/23/16 1425 01/24/16 1310  TSH 3.459  --   T3FREE  --  0.9*    Telemetry    SR with freq PVCs, PACs - Personally Reviewed  ECG    ST - Personally Reviewed  Radiology    Ct Abdomen Pelvis W Contrast  Result Date: 01/24/2016 CLINICAL DATA:  Colon stricture EXAM: CT ABDOMEN AND PELVIS WITH CONTRAST TECHNIQUE: Multidetector CT imaging of the abdomen and pelvis was performed using the standard protocol following bolus administration of intravenous contrast. CONTRAST:  177m ISOVUE-300 IOPAMIDOL (ISOVUE-300) INJECTION 61% COMPARISON:  11/17/2015 FINDINGS: Lower chest: Emphysema with bibasilar atelectasis and small bilateral pleural effusions.  Heart is enlarged. Hepatobiliary: No focal abnormality within the liver parenchyma. Small area of low attenuation in the anterior liver, adjacent to the falciform ligament, is in a characteristic location for focal fatty deposition. Gallbladder are unremarkable. No intrahepatic or extrahepatic biliary dilation. Pancreas: No focal mass lesion. No dilatation of the main duct. No intraparenchymal cyst. No peripancreatic edema. Spleen: No splenomegaly. No focal mass lesion. Adrenals/Urinary Tract: No adrenal nodule or mass. Bilateral renal cysts again noted, measuring up to 7.2 cm in the left kidney. Mild fullness noted both intrarenal collecting systems without overt hydroureter. Circumferential bladder wall thickening is noted. Stomach/Bowel: Stomach is nondistended. No gastric wall thickening. No evidence of outlet obstruction. Duodenum is normally positioned as is the ligament of Treitz. No small bowel wall thickening. No small bowel dilatation. The terminal ileum is normal. The appendix is normal. Abnormal sigmoid colon again noted. Previous exam showed a 4.2 x 3.2 cm "Mass" between the sigmoid colon in the pelvic sidewall. This has decreased in the interval now measuring 3.4 x 2.0 cm, but contains heterogeneous attenuation and gas. Medial to the sigmoid colon is another abnormal structure measuring 6.0 x 2.9 cm, also with mixed attenuation and gas. The sigmoid colon is so tortuous and matted down in this region is difficult to determine what is sigmoid colon and what may be para colonic mass or abscess. There is a collection of stool appearing material between the cervix and the rectum that cannot be confirmed to be intraluminal. Vascular/Lymphatic: There is abdominal aortic atherosclerosis without aneurysm. There is no gastrohepatic or hepatoduodenal ligament lymphadenopathy. No intraperitoneal or retroperitoneal lymphadenopathy. No pelvic sidewall lymphadenopathy. Reproductive: There appears to be marked wall  thickening in the vagina, but the areas not well seen secondary to substantial streak artifact from the right hip replacement. Musculoskeletal: There is prominent perirectal edema. Diffuse body wall edema is seen throughout the visualized portion of the abdomen and pelvis. Patient is status post right hip hemiarthroplasty. IMPRESSION: 1. Complex anatomy in the region of the sigmoid colon with a very redundant/tortuous sigmoid segment and multiple areas that could represent paracolonic abscess. There is material that appears to be stool in the cul-de-sac region that cannot be confirmed to be intraluminal as the rectum appears to pass posterior to this collection. Colonoscopy back in November was unsuccessful at passing through  this area of colon. CT imaging through the pelvis after allowing for migration of oral contrast into the distal rectum may prove helpful. Alternatively, repeat CT before and after administration of rectal water-soluble contrast may prove helpful. This anatomy may be very difficult to tease out by traditional fluoroscopic contrast enema, but that also might be an option. Diverticulitis with abscess and neoplasm could both have this appearance. 2. Diffuse body wall edema 3. Advanced abdominal aortic atherosclerosis. 4. Mild bilateral hydronephrosis with out substantial ureteral dilatation. There does appear to be circumferential bladder wall thickening. 5. Marked vaginal wall thickening. Electronically Signed   By: Misty Stanley M.D.   On: 01/24/2016 20:52    Cardiac Studies   None  Patient Profile     76 yo female with PMH of  COPD, anemia, HTN, Hyperthyroidism, CKD stage III andhip surgery who presented to the ED after a fall and noted to have runs of SVT.    Assessment & Plan    1. SVT - Interviewing telemetry and her EKGs, she is in sinus tachycardia most of the time, but does have episodes of SVT with rates close to 150 bpm. At times it appears that these episodes are due to  atrial tachycardia, but there are also times P waves are difficult to distinguish. -- she was started on metoprolol 25 mg as well as flecainide yesterday, HR seems to have improved today. She will need outpatient Myoview to see if she does have any evidence for ischemia.   2. Acute diastolic CHF - somewhat dyspneic this morning. Just received a treatment with some improvement.  -- remains on IV lasix '20mg'$  BID, weight inaccurate, I&Os with no documented output yesterday. Would continue for now as she is still dyspneic. Cr stable.   3. S/p Fall - Head CT neg for acute bleed. She denies syncope.   4. Hypotension - blood pressure much improved.   5. Heme + stool - Per primary service, GI following. Hgb drop 9.9>>8.7 today.  6. Hypokalemia - replaced.   LILLIANAH SWARTZENTRUBER is a 76 y.o. female with a hx of with a hx of COPD, hyperthyroidism.  She was noted to have SVT during an admission for urosepsis in 11/2015.  The monitor is not completed yet but there are notes in the chart that an episode of SVT was noted 12/22.  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  No strips are available for review in her chart.  There are several ECGs with SR or ST with very frequent PACs in a pattern of bigeminy. Medical Rx was continued with beta-blocker therapy. She has been experiencing palpitations at home and Dr Tamala Julian was planning to start a 30 day event monitor.   She was then admitted 1/1-1/5 with R hip fracture after suffering a fall.  She underwent hemiarthroplasty and was DC'd to [facility] for rehab.  She suffered a fall today at the SNF and struck her head on the toilet.  While in the hospital telemetry shows frequent paroxysmal SVTs with HR 140 BPM. She was seen by Dr Edison Simon yesterday and was started on metoprolol and flecainide. She had 4 very brief episodes yesterday, but none since then.  She now appears euvolemic, continue lasix 20 mg po daily, she has  h/o COPD, continue nebulizers. K has been replaced. Not rechecked today, we will order.  Ena Dawley, MD 01/26/2016

## 2016-01-26 NOTE — Progress Notes (Signed)
RN unable to document adequate output on pt on her shift from 11 a-7p d/t pt having several episodes of incontinence. Output occurrences are documented in epic. Hoover Brunette, RN

## 2016-01-26 NOTE — Progress Notes (Signed)
Subjective: No complaints.    Objective: Vital signs in last 24 hours: Temp:  [97.7 F (36.5 C)-98 F (36.7 C)] 97.9 F (36.6 C) (01/16 1555) Pulse Rate:  [68-84] 70 (01/16 1555) Resp:  [16-22] 17 (01/16 1555) BP: (88-111)/(44-59) 91/55 (01/16 1555) SpO2:  [96 %-100 %] 100 % (01/16 1555) Weight:  [61.1 kg (134 lb 9.6 oz)] 61.1 kg (134 lb 9.6 oz) (01/16 0318) Last BM Date: 01/26/16  Intake/Output from previous day: 01/15 0701 - 01/16 0700 In: 240 [P.O.:240] Out: 300 [Urine:300] Intake/Output this shift: Total I/O In: 477 [P.O.:477] Out: 320 [Urine:320]  General appearance: alert and no distress GI: soft, tympanic in the LLQ region, nontender  Lab Results:  Recent Labs  01/24/16 0343  01/25/16 0252  01/26/16 0254 01/26/16 0957 01/26/16 1518  WBC 12.8*  --  9.4  --   --   --   --   HGB 9.4*  < > 8.7*  < > 8.1* 8.4* 8.6*  HCT 30.2*  < > 28.4*  < > 26.9* 27.5* 27.8*  PLT 204  --  187  --   --   --   --   < > = values in this interval not displayed. BMET  Recent Labs  01/24/16 0343 01/25/16 0252  NA 140 138  K 3.6 2.8*  CL 101 94*  CO2 29 35*  GLUCOSE 86 91  BUN 11 9  CREATININE 0.91 0.98  CALCIUM 7.8* 7.6*   LFT No results for input(s): PROT, ALBUMIN, AST, ALT, ALKPHOS, BILITOT, BILIDIR, IBILI in the last 72 hours. PT/INR No results for input(s): LABPROT, INR in the last 72 hours. Hepatitis Panel No results for input(s): HEPBSAG, HCVAB, HEPAIGM, HEPBIGM in the last 72 hours. C-Diff  Recent Labs  01/25/16 1035  CDIFFTOX NEGATIVE   Fecal Lactopherrin No results for input(s): FECLLACTOFRN in the last 72 hours.  Studies/Results: Ct Abdomen Pelvis Wo Contrast  Result Date: 01/26/2016 CLINICAL DATA:  Diarrhea EXAM: CT ABDOMEN AND PELVIS WITHOUT CONTRAST TECHNIQUE: Multidetector CT imaging of the abdomen and pelvis was performed following the standard protocol without IV contrast. COMPARISON:  01/24/2016 FINDINGS: Lower chest: Stable cardiomegaly with  aortic atherosclerosis. No significant pericardial effusion. Stable bilateral small pleural effusions with atelectasis. Hepatobiliary: No focal abnormality within the liver parenchyma. Gallbladder are unremarkable. No intrahepatic or extrahepatic biliary dilation. Pancreas: Stable and nonacute Spleen: No splenomegaly.  No focal mass lesion. Adrenals/Urinary Tract: No adrenal mass or nodule. Bilateral renal cysts are again seen unchanged in appearance the largest is on the left measuring 7.1 cm in diameter. No hydroureteronephrosis. No genitourinary calculi. Bladder is partially obscured by streak artifacts from the patient's right hip arthroplasty. Bladder is physiologically distended without apparent focal mass. Stomach/Bowel: The stomach is not distended. There is normal small bowel rotation. Contrast has opacified the appendix and has centered large bowel. There is mild non-opacified small bowel distention involving jejunum and distal ileum without definite source of obstruction. On current exam, there is a short segmental area of transmural thickening involving the descending colon, best seen on series 5, image 39 and axial series 2, image 59. Beyond this, there is only a small amount of enteric contrast noted extending into a moderately thickened appearing rectum some which may be due to underdistention. Mid mid pelvic 6 x 2.9 cm area medial to the sigmoid colon with mixed attenuation and gas is more homogeneous in appearance with decrease in gas noted within. It is still uncertain whether this represents extraluminal collection, intramural  abscess or focal wall thickening. A similar appearance to the contralateral side outlining a thin stream of contrast, series 2, image 64 measuring up to 2 cm in thickness. No Vascular/Lymphatic: Abdominal aortic atherosclerosis without aneurysm. No lymphadenopathy. Reproductive: Nonacute. Other:  Mild diffuse body wall edema of the abdomen and pelvis. Musculoskeletal: Right  hip arthroplasty. IMPRESSION: Segmental areas of descending and sigmoid colonic thickening is suggested more so in the sigmoid. As there is only a scant amount of enteric contrast that has reached the rectum at the time of this study, further characterization is not possible. Differential possibilities may include rib fractures or inflammatory thickening, intramural abscess of the sigmoid more so along the medial aspect, redundant non-opacified adjacent loops of bowel, neoplasm or paracolic abscess. Further workup has initially stated with rectal water soluble contrast and repeat CT versus traditional fluoroscopic contrast enema may prove helpful. Electronically Signed   By: Ashley Royalty M.D.   On: 01/26/2016 15:13   Ct Abdomen Pelvis W Contrast  Result Date: 01/24/2016 CLINICAL DATA:  Colon stricture EXAM: CT ABDOMEN AND PELVIS WITH CONTRAST TECHNIQUE: Multidetector CT imaging of the abdomen and pelvis was performed using the standard protocol following bolus administration of intravenous contrast. CONTRAST:  169m ISOVUE-300 IOPAMIDOL (ISOVUE-300) INJECTION 61% COMPARISON:  11/17/2015 FINDINGS: Lower chest: Emphysema with bibasilar atelectasis and small bilateral pleural effusions. Heart is enlarged. Hepatobiliary: No focal abnormality within the liver parenchyma. Small area of low attenuation in the anterior liver, adjacent to the falciform ligament, is in a characteristic location for focal fatty deposition. Gallbladder are unremarkable. No intrahepatic or extrahepatic biliary dilation. Pancreas: No focal mass lesion. No dilatation of the main duct. No intraparenchymal cyst. No peripancreatic edema. Spleen: No splenomegaly. No focal mass lesion. Adrenals/Urinary Tract: No adrenal nodule or mass. Bilateral renal cysts again noted, measuring up to 7.2 cm in the left kidney. Mild fullness noted both intrarenal collecting systems without overt hydroureter. Circumferential bladder wall thickening is noted.  Stomach/Bowel: Stomach is nondistended. No gastric wall thickening. No evidence of outlet obstruction. Duodenum is normally positioned as is the ligament of Treitz. No small bowel wall thickening. No small bowel dilatation. The terminal ileum is normal. The appendix is normal. Abnormal sigmoid colon again noted. Previous exam showed a 4.2 x 3.2 cm "Mass" between the sigmoid colon in the pelvic sidewall. This has decreased in the interval now measuring 3.4 x 2.0 cm, but contains heterogeneous attenuation and gas. Medial to the sigmoid colon is another abnormal structure measuring 6.0 x 2.9 cm, also with mixed attenuation and gas. The sigmoid colon is so tortuous and matted down in this region is difficult to determine what is sigmoid colon and what may be para colonic mass or abscess. There is a collection of stool appearing material between the cervix and the rectum that cannot be confirmed to be intraluminal. Vascular/Lymphatic: There is abdominal aortic atherosclerosis without aneurysm. There is no gastrohepatic or hepatoduodenal ligament lymphadenopathy. No intraperitoneal or retroperitoneal lymphadenopathy. No pelvic sidewall lymphadenopathy. Reproductive: There appears to be marked wall thickening in the vagina, but the areas not well seen secondary to substantial streak artifact from the right hip replacement. Musculoskeletal: There is prominent perirectal edema. Diffuse body wall edema is seen throughout the visualized portion of the abdomen and pelvis. Patient is status post right hip hemiarthroplasty. IMPRESSION: 1. Complex anatomy in the region of the sigmoid colon with a very redundant/tortuous sigmoid segment and multiple areas that could represent paracolonic abscess. There is material that appears to be stool  in the cul-de-sac region that cannot be confirmed to be intraluminal as the rectum appears to pass posterior to this collection. Colonoscopy back in November was unsuccessful at passing through  this area of colon. CT imaging through the pelvis after allowing for migration of oral contrast into the distal rectum may prove helpful. Alternatively, repeat CT before and after administration of rectal water-soluble contrast may prove helpful. This anatomy may be very difficult to tease out by traditional fluoroscopic contrast enema, but that also might be an option. Diverticulitis with abscess and neoplasm could both have this appearance. 2. Diffuse body wall edema 3. Advanced abdominal aortic atherosclerosis. 4. Mild bilateral hydronephrosis with out substantial ureteral dilatation. There does appear to be circumferential bladder wall thickening. 5. Marked vaginal wall thickening. Electronically Signed   By: Misty Stanley M.D.   On: 01/24/2016 20:52    Medications:  Scheduled: . Chlorhexidine Gluconate Cloth  6 each Topical Q0600  . digoxin  0.125 mg Oral Daily  . feeding supplement (ENSURE ENLIVE)  237 mL Oral BID BM  . flecainide  50 mg Oral Q12H  . levalbuterol  0.63 mg Nebulization BID  . metoprolol tartrate  25 mg Oral BID  . mometasone-formoterol  2 puff Inhalation BID  . mupirocin ointment  1 application Nasal BID  . pantoprazole  40 mg Oral Q0600  . potassium chloride  40 mEq Oral Once  . sodium chloride flush  3 mL Intravenous Q12H   Continuous:   Assessment/Plan: 1) Abnormal sigmoid colon. 2) CHF. 3) Heme positive stool.   The repeat CT scan was not helpful today.  The contrast did not pass through the sigmoid colon.  I think a barium enema will help to provide further information.  For the findings on the scan, she is relatively asymptomatic.  Plan: 1) Barium enema.  LOS: 2 days   Alaze Garverick D 01/26/2016, 5:33 PM

## 2016-01-26 NOTE — Progress Notes (Signed)
Initial Nutrition Assessment  DOCUMENTATION CODES:   Non-severe (moderate) malnutrition in context of acute illness/injury  INTERVENTION:   Ensure Enlive po BID, each supplement provides 350 kcal and 20 grams of protein  NUTRITION DIAGNOSIS:   Malnutrition related to acute illness as evidenced by mild depletion of muscle mass, mild depletion of body fat.  GOAL:   Patient will meet greater than or equal to 90% of their needs  MONITOR:   PO intake, Supplement acceptance, Labs, I & O's  REASON FOR ASSESSMENT:   Consult Assessment of nutrition requirement/status  ASSESSMENT:   76 y.o. female with medical history significant of COPD, anemia, HTN, Hyperthyroidism, CKD stage III and hip surgery who presented to the Emergency Department from [facility] where she is recovering after her hip surgery. Patient fell when trying to get up off the toilet and hit the back of her head against the toilet.    Nutrition-Focused physical exam completed. Findings are mild-moderate fat depletion, mild-moderate and severe muscle depletion, and no edema.  Weight trending up since November 2017. Patient c/o poor appetite and poor intake. She likes Ensure. Labs reviewed: potassium 2.8 Medications reviewed and include KCl  Diet Order:  DIET SOFT Room service appropriate? Yes; Fluid consistency: Thin  Skin:  Wound (see comment) (wound to head)  Last BM:  1/16  Height:   Ht Readings from Last 1 Encounters:  01/24/16 '5\' 8"'$  (1.727 m)    Weight:   Wt Readings from Last 1 Encounters:  01/26/16 134 lb 9.6 oz (61.1 kg)    Ideal Body Weight:  63.6 kg  BMI:  Body mass index is 20.47 kg/m.  Estimated Nutritional Needs:   Kcal:  1400-1600  Protein:  70-80 gm  Fluid:  1.5 L  EDUCATION NEEDS:   No education needs identified at this time  Molli Barrows, [place], [place], [place] Pager [phone] After Hours Pager [phone]

## 2016-01-26 NOTE — Progress Notes (Signed)
NP on call notified of pt c/o of being sob. Pt's lung sounds are clear & diminished. Pt's O2 sats have ranged from 95-100% & the pt is on 4 L [place]. Pt's oral dose of lasix was held this am d/t soft bps of her sbp ranging from 90s-100s. Pt just given a breathing treatment by respiratory but per pt it did not help her any. New orders to give one time dose of lasix PO 20 mg &  To give her xanax early. Hoover Brunette, RN

## 2016-01-26 NOTE — Evaluation (Signed)
Physical Therapy Evaluation Patient Details Name: Dawn [name] MRN: [id number] DOB: 1940-06-30 Today's Date: 01/26/2016   History of Present Illness  Patient is a 76 y/o female with Rt hip hemi 01/12/16 who was d/ced to [facility] and returns due to fall and hitting her head. PMH includes COPD, chronic pain, spinal stenosis. Has had diarrhia. Abd CT-concern for paracolonic abscess. Has had episodes of SVT and GI bleeding.  Clinical Impression  Patient presents with anxiety, weakness, and impaired mobility s/p recent hip surgery and due to being bed bound. Pt from [facility] and was requiring assist with ADLs and ambulation. Pt reports feeling very depressed and anxious. Rn notified. Tolerated multiple SPT to/from [facility] and to chair with Min A for balance. Pt fearful of falling. Would benefit from continued therapy at SNF to maximize independence and mobility prior to return home. Will follow.    Follow Up Recommendations SNF    Equipment Recommendations  None recommended by PT    Recommendations for Other Services OT consult     Precautions / Restrictions Precautions Precautions: Fall;Posterior Hip Restrictions Weight Bearing Restrictions: Yes RLE Weight Bearing: Weight bearing as tolerated      Mobility  Bed Mobility Overal bed mobility: Needs Assistance Bed Mobility: Supine to Sit     Supine to sit: Min guard;HOB elevated     General bed mobility comments: Able to get to EOB wihtout assist, increased time. Use of rail for support. No dizziness.  Transfers Overall transfer level: Needs assistance Equipment used: Rolling walker (2 wheeled) Transfers: Sit to/from [facility] Sit to Stand: Min guard;Min assist Stand pivot transfers: Min assist       General transfer comment: Min guard initially to stand from EOB, Min A to stand from low chair. SPT to chair and then SPT chair to/from [facility] without use of RW with Min A.  Ambulation/Gait                Stairs            Wheelchair Mobility    Modified Rankin (Stroke Patients Only)       Balance Overall balance assessment: Needs assistance;History of Falls Sitting-balance support: Feet supported;Bilateral upper extremity supported Sitting balance-Leahy Scale: Fair     Standing balance support: During functional activity;Single extremity supported Standing balance-Leahy Scale: Fair Standing balance comment: Requires at least 1 UE support in standing. Total A for pericare                             Pertinent Vitals/Pain Pain Assessment: Faces Faces Pain Scale: Hurts even more Pain Location: back-chronic Pain Descriptors / Indicators: Sore;Aching Pain Intervention(s): Monitored during session;Repositioned    Home Living Family/patient expects to be discharged to:: Skilled nursing facility                      Prior Function Level of Independence: Needs assistance         Comments: Pt has been at [facility] since hip surgery 1/4 and has needed assist for ADLs and walking with a RW.      Hand Dominance   Dominant Hand: Right    Extremity/Trunk Assessment   Upper Extremity Assessment Upper Extremity Assessment: Defer to OT evaluation    Lower Extremity Assessment Lower Extremity Assessment: Generalized weakness    Cervical / Trunk Assessment Cervical / Trunk Assessment: Normal  Communication   Communication: No difficulties  Cognition  Arousal/Alertness: Awake/alert Behavior During Therapy: Agitated;Anxious (reports being depressed) Overall Cognitive Status: Within Functional Limits for tasks assessed                 General Comments: Pt wants her xanax restarted, reports depression and anxiety.     General Comments General comments (skin integrity, edema, etc.): Sp02 dropped to mid 80s on RA.    Exercises     Assessment/Plan    PT Assessment Patient needs continued PT services  PT Problem List Decreased  strength;Decreased mobility;Decreased activity tolerance;Decreased balance;Decreased knowledge of use of DME;Pain;Cardiopulmonary status limiting activity          PT Treatment Interventions DME instruction;Therapeutic activities;Gait training;Therapeutic exercise;Patient/family education;Functional mobility training;Balance training    PT Goals (Current goals can be found in the Care Plan section)  Acute Rehab PT Goals Patient Stated Goal: not to go back to [facility], to go home and to get her xanax restarted PT Goal Formulation: With patient Time For Goal Achievement: 02/09/16 Potential to Achieve Goals: Fair    Frequency Min 2X/week   Barriers to discharge        Co-evaluation               End of Session Equipment Utilized During Treatment: Gait belt;Oxygen Activity Tolerance: Patient limited by pain;Patient tolerated treatment well Patient left: in chair;with call bell/phone within reach;with chair alarm set Nurse Communication: Mobility status         Time: 1108-1140 PT Time Calculation (min) (ACUTE ONLY): 32 min   Charges:   PT Evaluation $PT Eval Low Complexity: 1 Procedure PT Treatments $Therapeutic Activity: 8-22 mins   PT G Codes:        Gracia Saggese A Dajuana Palen 01/26/2016, 12:01 PM Wray Kearns, PT, DPT [phone]

## 2016-01-26 NOTE — Progress Notes (Signed)
PROGRESS NOTE  Dawn [name] HCW:[id number] DOB: 21-Nov-1940 DOA: 01/23/2016 PCP: Dawn Garbe, MD   LOS: 2 days   Brief Narrative: Dawn [name] is a 76 y.o. female with medical history significant of COPD, anemia, HTN, Hyperthyroidism, CKD stage III and hip surgery who presented to the Emergency Department from [place] where she is recovering after her hip surgery. Patient fell when trying to get up off the toilet and hit the back of her head against the toilet.    Assessment & Plan: Principal Problem:   Fall Active Problems:   Anxiety   Chronic pain syndrome   COPD (chronic obstructive pulmonary disease) (HCC)   Hyperthyroidism   Hypotension   SVT (supraventricular tachycardia) (HCC)   Depression   Upper GI bleed   Blood loss anemia   Heme + stool   Abnormal CT scan, colon   Acute diastolic CHF (congestive heart failure) (HCC)   Paroxysmal junctional tachycardia (HCC)   Hypoxia    Diarrhea - patient with intermittent significant diarrhea for a while. CT scan done 1/14 showed "complex anatomy in the region of the sigmoid colon with a very redundant / tortuous sigmoid segment and multiple areas that could represent paracolonic abscess". Clinically she does not appear to have an infection, her white count is normal, and she has no abdominal pain on exam. C. difficile negative - Gastroenterology consulted, discussed with Dr. Collene [name] last night, not sure based on imaging alone what is going on. We'll repeat a CT scan this morning hopefully oral contrast is passed through the distal rectum. Of note, she had a flexible sigmoidoscopy attempted last year could not be passed through this area - Consulted general surgery to evaluate as well and whether patient would benefit from ex lap  Sinus tachycardia/SVT - Cardiology was consulted, EP evaluated patient. She was started on metoprolol and flecainide. Rate is overall improved, continue current regimen  Acute  on chronic diastolic CHF - Most recent 2-D echo was done on 01/14/2016, showed normal ejection fraction of 50-55%. Chest x-ray on admission showed pulmonary vascular congestion, and she is clinically fluid overloaded.  - Hold Lasix this morning given soft blood pressures  GI bleeding - GI following. Hb overall stable, appears to have gastritis while on Aspirin post hip fracture. Continue PPI. Advance diet. No plans for EGD  Fall - unclear if patient had a syncopal episode vs pure mechanical fall vs opioid overdose, resolved, she is back to baseline today. ?related to her arrhythmia.  Exacerbation of chronic anemia associated with GI bleed (blood loss anemia) - So far hemoglobin is stable, continue to monitory H&H. No apparent plans for EGD  Depression/anxiety - Continue Wellbutrin and  Xanax (prn)  Hyperthyroidism - Continue Methimazole. TSH 3.45, free T4 normal. Continue nebulizers   DVT prophylaxis: SCD Code Status: Full code Family Communication: no family at bedside Disposition Plan: SNF when ready   Consultants:   Cardiology   GI  Procedures:   None   Antimicrobials:  None    Subjective: - feels a bit better, less diarrhea.  Objective: Vitals:   01/26/16 0400 01/26/16 0808 01/26/16 0810 01/26/16 0844  BP: (!) 91/52   (!) 111/57  Pulse:  71  84  Resp:  18    Temp:  97.8 F (36.6 C)    TempSrc:  Oral    SpO2:  97% 97%   Weight:      Height:        Intake/Output Summary (Last 24  hours) at 01/26/16 1032 Last data filed at 01/26/16 0704  Gross per 24 hour  Intake              240 ml  Output              220 ml  Net               20 ml   Filed Weights   01/24/16 0006 01/25/16 0500 01/26/16 0318  Weight: 59.9 kg (131 lb 15.8 oz) 64.2 kg (141 lb 9.6 oz) 61.1 kg (134 lb 9.6 oz)    Examination: Constitutional: NAD Vitals:   01/26/16 0400 01/26/16 0808 01/26/16 0810 01/26/16 0844  BP: (!) 91/52   (!) 111/57  Pulse:  71  84  Resp:  18    Temp:  97.8 F  (36.6 C)    TempSrc:  Oral    SpO2:  97% 97%   Weight:      Height:       Eyes: PERRL, lids and conjunctivae normal. Pale appearing ENMT: Mucous membranes are dry. Respiratory: cminimal wheezing, no crackles, moves air well  Cardiovascular: Regular rate and rhythm, no murmurs / rubs / gallops. tachycardic Abdomen: no tenderness. Bowel sounds positive.  Musculoskeletal: no clubbing / cyanosis.  Skin: no rashes, lesions, ulcers. No induration Neurologic: non focal. AxOx4  Data Reviewed: I have personally reviewed following labs and imaging studies  CBC:  Recent Labs Lab 01/23/16 0516  01/24/16 0343  01/25/16 0252 01/25/16 0845 01/25/16 1416 01/25/16 2053 01/26/16 0254  WBC 9.0  --  12.8*  --  9.4  --   --   --   --   NEUTROABS 7.0  --  11.4*  --   --   --   --   --   --   HGB 8.6*  < > 9.4*  < > 8.7* 9.7* 8.8* 8.6* 8.1*  HCT 28.6*  < > 30.2*  < > 28.4* 31.0* 28.7* 27.5* 26.9*  MCV 99.3  --  95.6  --  95.0  --   --   --   --   PLT 177  --  204  --  187  --   --   --   --   < > = values in this interval not displayed. Basic Metabolic Panel:  Recent Labs Lab 01/23/16 0245 01/23/16 0516 01/23/16 0846 01/24/16 0343 01/25/16 0252  NA 142 141  --  140 138  K 4.1 4.1  --  3.6 2.8*  CL 101 108  --  101 94*  CO2  --  27  --  29 35*  GLUCOSE 97 87  --  86 91  BUN 12 11  --  11 9  CREATININE 1.00 0.91  --  0.91 0.98  CALCIUM  --  7.7*  --  7.8* 7.6*  MG  --   --  1.8 2.0  --   PHOS  --   --  3.4  --   --    GFR: Estimated Creatinine Clearance: 47.1 mL/min (by C-G formula based on SCr of 0.98 mg/dL). Liver Function Tests:  Recent Labs Lab 01/23/16 0846  AST 12*  ALT 10*  ALKPHOS 94  BILITOT 0.4  PROT 4.7*  ALBUMIN 1.7*   Cardiac Enzymes:  Recent Labs Lab 01/23/16 0846 01/23/16 1425  TROPONINI <0.03 <0.03   Thyroid Function Tests:  Recent Labs  01/23/16 1425 01/24/16 1310 01/24/16 1449  TSH 3.459  --   --  FREET4  --   --  0.61  T3FREE  --  0.9*   --    Anemia Panel: No results for input(s): VITAMINB12, FOLATE, FERRITIN, TIBC, IRON, RETICCTPCT in the last 72 hours. Urine analysis:    Component Value Date/Time   COLORURINE YELLOW 01/23/2016 [place] 01/23/2016 1223   LABSPEC 1.009 01/23/2016 1223   PHURINE 6.0 01/23/2016 1223   GLUCOSEU NEGATIVE 01/23/2016 1223   HGBUR NEGATIVE 01/23/2016 [place] 01/23/2016 1223   KETONESUR NEGATIVE 01/23/2016 1223   PROTEINUR NEGATIVE 01/23/2016 1223   UROBILINOGEN 0.2 04/29/2008 2235   NITRITE NEGATIVE 01/23/2016 1223   LEUKOCYTESUR NEGATIVE 01/23/2016 1223   Sepsis Labs: Invalid input(s): PROCALCITONIN, LACTICIDVEN  Recent Results (from the past 240 hour(s))  MRSA PCR Screening     Status: Abnormal   Collection Time: 01/23/16  3:00 PM  Result Value Ref Range Status   MRSA by PCR POSITIVE (A) NEGATIVE Final    Comment:        The GeneXpert MRSA Assay (FDA approved for NASAL specimens only), is one component of a comprehensive MRSA colonization surveillance program. It is not intended to diagnose MRSA infection nor to guide or monitor treatment for MRSA infections. RESULT CALLED TO, READ BACK BY AND VERIFIED WITH: C COLOMBO,RN AT 1656 01/23/16 BY L BENFIELD   C difficile quick scan w PCR reflex     Status: None   Collection Time: 01/25/16 10:35 AM  Result Value Ref Range Status   C Diff antigen NEGATIVE NEGATIVE Final   C Diff toxin NEGATIVE NEGATIVE Final   C Diff interpretation No C. difficile detected.  Final      Radiology Studies: Ct Abdomen Pelvis W Contrast  Result Date: 01/24/2016 CLINICAL DATA:  Colon stricture EXAM: CT ABDOMEN AND PELVIS WITH CONTRAST TECHNIQUE: Multidetector CT imaging of the abdomen and pelvis was performed using the standard protocol following bolus administration of intravenous contrast. CONTRAST:  174m ISOVUE-300 IOPAMIDOL (ISOVUE-300) INJECTION 61% COMPARISON:  11/17/2015 FINDINGS: Lower chest: Emphysema  with bibasilar atelectasis and small bilateral pleural effusions. Heart is enlarged. Hepatobiliary: No focal abnormality within the liver parenchyma. Small area of low attenuation in the anterior liver, adjacent to the falciform ligament, is in a characteristic location for focal fatty deposition. Gallbladder are unremarkable. No intrahepatic or extrahepatic biliary dilation. Pancreas: No focal mass lesion. No dilatation of the main duct. No intraparenchymal cyst. No peripancreatic edema. Spleen: No splenomegaly. No focal mass lesion. Adrenals/Urinary Tract: No adrenal nodule or mass. Bilateral renal cysts again noted, measuring up to 7.2 cm in the left kidney. Mild fullness noted both intrarenal collecting systems without overt hydroureter. Circumferential bladder wall thickening is noted. Stomach/Bowel: Stomach is nondistended. No gastric wall thickening. No evidence of outlet obstruction. Duodenum is normally positioned as is the ligament of Treitz. No small bowel wall thickening. No small bowel dilatation. The terminal ileum is normal. The appendix is normal. Abnormal sigmoid colon again noted. Previous exam showed a 4.2 x 3.2 cm "Mass" between the sigmoid colon in the pelvic sidewall. This has decreased in the interval now measuring 3.4 x 2.0 cm, but contains heterogeneous attenuation and gas. Medial to the sigmoid colon is another abnormal structure measuring 6.0 x 2.9 cm, also with mixed attenuation and gas. The sigmoid colon is so tortuous and matted down in this region is difficult to determine what is sigmoid colon and what may be para colonic mass or abscess. There is a collection of stool appearing  material between the cervix and the rectum that cannot be confirmed to be intraluminal. Vascular/Lymphatic: There is abdominal aortic atherosclerosis without aneurysm. There is no gastrohepatic or hepatoduodenal ligament lymphadenopathy. No intraperitoneal or retroperitoneal lymphadenopathy. No pelvic sidewall  lymphadenopathy. Reproductive: There appears to be marked wall thickening in the vagina, but the areas not well seen secondary to substantial streak artifact from the right hip replacement. Musculoskeletal: There is prominent perirectal edema. Diffuse body wall edema is seen throughout the visualized portion of the abdomen and pelvis. Patient is status post right hip hemiarthroplasty. IMPRESSION: 1. Complex anatomy in the region of the sigmoid colon with a very redundant/tortuous sigmoid segment and multiple areas that could represent paracolonic abscess. There is material that appears to be stool in the cul-de-sac region that cannot be confirmed to be intraluminal as the rectum appears to pass posterior to this collection. Colonoscopy back in November was unsuccessful at passing through this area of colon. CT imaging through the pelvis after allowing for migration of oral contrast into the distal rectum may prove helpful. Alternatively, repeat CT before and after administration of rectal water-soluble contrast may prove helpful. This anatomy may be very difficult to tease out by traditional fluoroscopic contrast enema, but that also might be an option. Diverticulitis with abscess and neoplasm could both have this appearance. 2. Diffuse body wall edema 3. Advanced abdominal aortic atherosclerosis. 4. Mild bilateral hydronephrosis with out substantial ureteral dilatation. There does appear to be circumferential bladder wall thickening. 5. Marked vaginal wall thickening. Electronically Signed   By: Misty Stanley M.D.   On: 01/24/2016 20:52   Scheduled Meds: . Chlorhexidine Gluconate Cloth  6 each Topical Q0600  . digoxin  0.125 mg Oral Daily  . flecainide  50 mg Oral Q12H  . iohexol  15 mL Oral Q1 Hr x 2  . levalbuterol  0.63 mg Nebulization BID  . metoprolol tartrate  25 mg Oral BID  . mometasone-formoterol  2 puff Inhalation BID  . mupirocin ointment  1 application Nasal BID  . pantoprazole  40 mg Oral  Q0600  . potassium chloride  40 mEq Oral Once  . sodium chloride flush  3 mL Intravenous Q12H   Continuous Infusions:   Marzetta Board, MD, PhD [facility] Pager [phone]  If 7PM-7AM, please contact night-coverage [email or web address] Password [facility] 01/26/2016, 10:32 AM

## 2016-01-26 NOTE — Clinical Social Work Note (Signed)
Clinical Social Worker continuing to follow patient and family for support and discharge planning needs.  Patient not yet medically ready for discharge.  Patient plan is to return to [facility] once medically stable.  CSW remains available for support and to facilitate patient discharge needs once medically ready.  Barbette Or, [place]

## 2016-01-26 NOTE — Consult Note (Signed)
[facility] Surgery Consult Note  Satoria Dunlop [facility] 28-Jan-1940  [id number].    Requesting MD: Cruzita Lederer, MD Chief Complaint/Reason for Consult: sigmoid stricture  HPI:  Dawn [name] is a 76 y.o. female with medical history significant of COPD, anemia, HTN, Hyperthyroidism, CKD III, and recent right hemiarthroplasty (01/12/16) who presented to the Emergency Department from [place] facility on 01/23/16 after a fall. Patient fell when trying to get up off the toilet and hit the back of her head against the toilet. ED workup was significant for head laceration (closed with 2 staples), hypotension, tachycardia, acute on chronic anemia, and melena/heme+ stool. Head CT was negative for acute intracranial abnormality and the patient was admitted for observation and further workup. CT scan of the abdomen/pelvis this admission was significant for complex anatomy in the region of the sigmoid colon with a very redundant/tortuous sigmoid segment and multiple areas that could represent paracolonic abscess. General surgery was asked to consult regarding possible GI bleed/sigmoid pathology.  Today the patient reports small-volume diarrhea that has been present since "before Christmas" and occurs every half hour. She also endorses decreased appetite and mobility since her hip surgery. She reports weight fluctuation between 110 and 140 lbs. She denies abdominal pain, nausea, vomiting, or dark stools prior to this hospital admission. Based on chart review, the patient has no history of diverticulitis or abdominal surgery. She is currently taking ASA 325 mg daily post-operatively as recommended by orthopedics.     Patient has a history of hospitalization in 11/2015 for encephalopathy 2/2 urosepsis. At that time she had diarrhea and fever. CT scan showed abnormal rectosigmoid colon with inflammatory changes. She was treated with antibiotics. She had a failed colonoscopy due to sigmoid stricture versus  tortuosity.   ROS: Review of Systems  Constitutional: Negative for chills and fever.  Respiratory: Positive for shortness of breath.   Cardiovascular: Negative for chest pain.  Gastrointestinal: Positive for diarrhea. Negative for abdominal pain, heartburn, nausea and vomiting.  All other systems reviewed and are negative.  Family History  Problem Relation Age of Onset  . Heart attack Mother   . Diabetes Father   . Thyroid disease Neg Hx     Past Medical History:  Diagnosis Date  . COPD (chronic obstructive pulmonary disease) ([place])   . History of anxiety   . History of depression   . Spinal stenosis   . UTI (urinary tract infection)     Past Surgical History:  Procedure Laterality Date  . FLEXIBLE SIGMOIDOSCOPY Left 11/18/2015   Procedure: FLEXIBLE SIGMOIDOSCOPY;  Surgeon: [name], MD;  Location: [facility];  Service: Endoscopy;  Laterality: Left;  . FLEXIBLE SIGMOIDOSCOPY N/A 11/20/2015   Procedure: FLEXIBLE SIGMOIDOSCOPY;  Surgeon: [name], MD;  Location: [facility] ENDOSCOPY;  Service: Endoscopy;  Laterality: N/A;  . HIP ARTHROPLASTY Right 01/12/2016   Procedure: ARTHROPLASTY BIPOLAR HIP (HEMIARTHROPLASTY);  Surgeon: [name], MD;  Location: [facility];  Service: Orthopedics;  Laterality: Right;    Social History:  reports that she has quit smoking. She has never used smokeless tobacco. She reports that she does not drink alcohol or use drugs.  Allergies: No Known Allergies  Medications Prior to Admission  Medication Sig Dispense Refill  . ALPRAZolam (XANAX) 0.5 MG tablet Take 1 tablet (0.5 mg total) by mouth 2 (two) times daily as needed for anxiety. 60 tablet 0  . aspirin EC 325 MG EC tablet Take 1 tablet (325 mg total) by mouth 2 (two) times daily. Take for 4 weeks.  60 tablet 0  . bisacodyl (DULCOLAX) 10 MG suppository Place 10 mg rectally as needed for moderate constipation.    Marland Kitchen buPROPion (WELLBUTRIN XL) 150 MG 24 hr tablet Take 150 mg by mouth daily.    Marland Kitchen docusate  sodium (COLACE) 100 MG capsule Take 1 capsule (100 mg total) by mouth 2 (two) times daily. 10 capsule 0  . ferrous sulfate 325 (65 FE) MG tablet Take 325 mg by mouth 2 (two) times daily after a meal.    . gabapentin (NEURONTIN) 600 MG tablet Take 600 mg by mouth 3 (three) times daily.    Marland Kitchen HYDROcodone-acetaminophen (NORCO) 7.5-325 MG tablet Take 1 tablet by mouth every 6 (six) hours as needed for moderate pain.    . INCRUSE ELLIPTA 62.5 MCG/INH AEPB Inhale 1 puff into the lungs daily.  3  . loperamide (IMODIUM) 2 MG capsule Take 4 mg by mouth as needed for diarrhea or loose stools.    . magnesium hydroxide (MILK OF MAGNESIA) 400 MG/5ML suspension Take 30 mLs by mouth daily as needed for mild constipation.    . methimazole (TAPAZOLE) 5 MG tablet Take 5 mg by mouth daily.    . metoprolol tartrate (LOPRESSOR) 25 MG tablet Take 0.5 tablets (12.5 mg total) by mouth 2 (two) times daily. 60 tablet 0  . Sodium Phosphates (RA SALINE ENEMA) 19-7 GM/118ML ENEM Place 1 each rectally as needed (for constipation).    . traZODone (DESYREL) 50 MG tablet Take 50 mg by mouth at bedtime.    Marland Kitchen UNABLE TO FIND Take 120 mLs by mouth 2 (two) times daily. Med Name: Med Pass      Blood pressure (!) 111/57, pulse 84, temperature 97.8 F (36.6 C), temperature source Oral, resp. rate 18, height '5\' 8"'  (1.727 m), weight 61.1 kg (134 lb 9.6 oz), SpO2 97 %. Physical Exam: General: pleasant, ill-appearing white female who is sitting in bed in NAD HEENT: head is normocephalic, atraumatic. Mouth is pink and moist Heart: regular, rate, and rhythm.  No obvious murmurs, gallops, or rubs noted.  Lungs: Respiratory effort nonlabored Abd: soft, NT/ND, +BS Rectal: no acute abnormalities y MS: all 4 extremities are symmetrical  Skin: warm and dry with no masses, lesions, or rashes Psych: A&Ox3 with an flattened affect. Neuro: extremity CSM intact bilaterally, normal speech  Results for orders placed or performed during the hospital  encounter of 01/23/16 (from the past 48 hour(s))  T3, free     Status: Abnormal   Collection Time: 01/24/16  1:10 PM  Result Value Ref Range   T3, Free 0.9 (L) 2.0 - 4.4 pg/mL    Comment: (NOTE) Performed At: [facility] [address] [place], [place] [id number] Lindon Romp MD NI:[id number]   Hemoglobin and hematocrit, blood     Status: Abnormal   Collection Time: 01/24/16  2:49 PM  Result Value Ref Range   Hemoglobin 9.5 (L) 12.0 - 15.0 g/dL   HCT 31.2 (L) 36.0 - 46.0 %  T4, free     Status: None   Collection Time: 01/24/16  2:49 PM  Result Value Ref Range   Free T4 0.61 0.61 - 1.12 ng/dL    Comment: (NOTE) Biotin ingestion may interfere with free T4 tests. If the results are inconsistent with the TSH level, previous test results, or the clinical presentation, then consider biotin interference. If needed, order repeat testing after stopping biotin.   Hemoglobin and hematocrit, blood     Status: Abnormal   Collection Time:  01/24/16  9:09 PM  Result Value Ref Range   Hemoglobin 9.9 (L) 12.0 - 15.0 g/dL   HCT 32.0 (L) 36.0 - 46.0 %  Procalcitonin     Status: None   Collection Time: 01/25/16  2:52 AM  Result Value Ref Range   Procalcitonin 0.45 ng/mL    Comment:        Interpretation: PCT (Procalcitonin) <= 0.5 ng/mL: Systemic infection (sepsis) is not likely. Local bacterial infection is possible. (NOTE)         ICU PCT Algorithm               Non ICU PCT Algorithm    ----------------------------     ------------------------------         PCT < 0.25 ng/mL                 PCT < 0.1 ng/mL     Stopping of antibiotics            Stopping of antibiotics       strongly encouraged.               strongly encouraged.    ----------------------------     ------------------------------       PCT level decrease by               PCT < 0.25 ng/mL       >= 80% from peak PCT       OR PCT 0.25 - 0.5 ng/mL          Stopping of antibiotics                                              encouraged.     Stopping of antibiotics           encouraged.    ----------------------------     ------------------------------       PCT level decrease by              PCT >= 0.25 ng/mL       < 80% from peak PCT        AND PCT >= 0.5 ng/mL            Continuin g antibiotics                                              encouraged.       Continuing antibiotics            encouraged.    ----------------------------     ------------------------------     PCT level increase compared          PCT > 0.5 ng/mL         with peak PCT AND          PCT >= 0.5 ng/mL             Escalation of antibiotics                                          strongly encouraged.      Escalation of antibiotics  strongly encouraged.   CBC     Status: Abnormal   Collection Time: 01/25/16  2:52 AM  Result Value Ref Range   WBC 9.4 4.0 - 10.5 K/uL   RBC 2.99 (L) 3.87 - 5.11 MIL/uL   Hemoglobin 8.7 (L) 12.0 - 15.0 g/dL   HCT 28.4 (L) 36.0 - 46.0 %   MCV 95.0 78.0 - 100.0 fL   MCH 29.1 26.0 - 34.0 pg   MCHC 30.6 30.0 - 36.0 g/dL   RDW 17.2 (H) 11.5 - 15.5 %   Platelets 187 150 - 400 K/uL  Basic metabolic panel     Status: Abnormal   Collection Time: 01/25/16  2:52 AM  Result Value Ref Range   Sodium 138 135 - 145 mmol/L   Potassium 2.8 (L) 3.5 - 5.1 mmol/L    Comment: DELTA CHECK NOTED   Chloride 94 (L) 101 - 111 mmol/L   CO2 35 (H) 22 - 32 mmol/L   Glucose, Bld 91 65 - 99 mg/dL   BUN 9 6 - 20 mg/dL   Creatinine, Ser 0.98 0.44 - 1.00 mg/dL   Calcium 7.6 (L) 8.9 - 10.3 mg/dL   GFR calc non Af Amer 55 (L) >60 mL/min   GFR calc Af Amer >60 >60 mL/min    Comment: (NOTE) The eGFR has been calculated using the CKD EPI equation. This calculation has not been validated in all clinical situations. eGFR's persistently <60 mL/min signify possible Chronic Kidney Disease.    Anion gap 9 5 - 15  Hemoglobin and hematocrit, blood     Status: Abnormal   Collection Time: 01/25/16  8:45 AM  Result  Value Ref Range   Hemoglobin 9.7 (L) 12.0 - 15.0 g/dL   HCT 31.0 (L) 36.0 - 46.0 %  C difficile quick scan w PCR reflex     Status: None   Collection Time: 01/25/16 10:35 AM  Result Value Ref Range   C Diff antigen NEGATIVE NEGATIVE   C Diff toxin NEGATIVE NEGATIVE   C Diff interpretation No C. difficile detected.   Hemoglobin and hematocrit, blood     Status: Abnormal   Collection Time: 01/25/16  2:16 PM  Result Value Ref Range   Hemoglobin 8.8 (L) 12.0 - 15.0 g/dL   HCT 28.7 (L) 36.0 - 46.0 %  Glucose, capillary     Status: Abnormal   Collection Time: 01/25/16  4:01 PM  Result Value Ref Range   Glucose-Capillary 107 (H) 65 - 99 mg/dL  Hemoglobin and hematocrit, blood     Status: Abnormal   Collection Time: 01/25/16  8:53 PM  Result Value Ref Range   Hemoglobin 8.6 (L) 12.0 - 15.0 g/dL   HCT 27.5 (L) 36.0 - 46.0 %  Hemoglobin and hematocrit, blood     Status: Abnormal   Collection Time: 01/26/16  2:54 AM  Result Value Ref Range   Hemoglobin 8.1 (L) 12.0 - 15.0 g/dL   HCT 26.9 (L) 36.0 - 46.0 %   Ct Abdomen Pelvis W Contrast  Result Date: 01/24/2016 CLINICAL DATA:  Colon stricture EXAM: CT ABDOMEN AND PELVIS WITH CONTRAST TECHNIQUE: Multidetector CT imaging of the abdomen and pelvis was performed using the standard protocol following bolus administration of intravenous contrast. CONTRAST:  117m ISOVUE-300 IOPAMIDOL (ISOVUE-300) INJECTION 61% COMPARISON:  11/17/2015 FINDINGS: Lower chest: Emphysema with bibasilar atelectasis and small bilateral pleural effusions. Heart is enlarged. Hepatobiliary: No focal abnormality within the liver parenchyma. Small area of low attenuation in the anterior liver,  adjacent to the falciform ligament, is in a characteristic location for focal fatty deposition. Gallbladder are unremarkable. No intrahepatic or extrahepatic biliary dilation. Pancreas: No focal mass lesion. No dilatation of the main duct. No intraparenchymal cyst. No peripancreatic edema.  Spleen: No splenomegaly. No focal mass lesion. Adrenals/Urinary Tract: No adrenal nodule or mass. Bilateral renal cysts again noted, measuring up to 7.2 cm in the left kidney. Mild fullness noted both intrarenal collecting systems without overt hydroureter. Circumferential bladder wall thickening is noted. Stomach/Bowel: Stomach is nondistended. No gastric wall thickening. No evidence of outlet obstruction. Duodenum is normally positioned as is the ligament of Treitz. No small bowel wall thickening. No small bowel dilatation. The terminal ileum is normal. The appendix is normal. Abnormal sigmoid colon again noted. Previous exam showed a 4.2 x 3.2 cm "Mass" between the sigmoid colon in the pelvic sidewall. This has decreased in the interval now measuring 3.4 x 2.0 cm, but contains heterogeneous attenuation and gas. Medial to the sigmoid colon is another abnormal structure measuring 6.0 x 2.9 cm, also with mixed attenuation and gas. The sigmoid colon is so tortuous and matted down in this region is difficult to determine what is sigmoid colon and what may be para colonic mass or abscess. There is a collection of stool appearing material between the cervix and the rectum that cannot be confirmed to be intraluminal. Vascular/Lymphatic: There is abdominal aortic atherosclerosis without aneurysm. There is no gastrohepatic or hepatoduodenal ligament lymphadenopathy. No intraperitoneal or retroperitoneal lymphadenopathy. No pelvic sidewall lymphadenopathy. Reproductive: There appears to be marked wall thickening in the vagina, but the areas not well seen secondary to substantial streak artifact from the right hip replacement. Musculoskeletal: There is prominent perirectal edema. Diffuse body wall edema is seen throughout the visualized portion of the abdomen and pelvis. Patient is status post right hip hemiarthroplasty. IMPRESSION: 1. Complex anatomy in the region of the sigmoid colon with a very redundant/tortuous sigmoid  segment and multiple areas that could represent paracolonic abscess. There is material that appears to be stool in the cul-de-sac region that cannot be confirmed to be intraluminal as the rectum appears to pass posterior to this collection. Colonoscopy back in November was unsuccessful at passing through this area of colon. CT imaging through the pelvis after allowing for migration of oral contrast into the distal rectum may prove helpful. Alternatively, repeat CT before and after administration of rectal water-soluble contrast may prove helpful. This anatomy may be very difficult to tease out by traditional fluoroscopic contrast enema, but that also might be an option. Diverticulitis with abscess and neoplasm could both have this appearance. 2. Diffuse body wall edema 3. Advanced abdominal aortic atherosclerosis. 4. Mild bilateral hydronephrosis with out substantial ureteral dilatation. There does appear to be circumferential bladder wall thickening. 5. Marked vaginal wall thickening. Electronically Signed   By: Misty Stanley M.D.   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  - Agree with CT scan w/ PO contrast, barium enema may be necessary to further evaluate inflammatory changes and rule out malignancy, especially in the presence of heme positive stool. - There is no acute role for surgery without further imaging/work-up. General surgery will follow. - high perioperative risk given COPD, heart failure, arrhythmia, and malnutrition (albumin 1.7)   Sinus tachycardia/SVT  Acute on chronic diastolic CHF  GI bleeding  Fall Acute on chronic anemia Depression/anxiety Hyperthyroidism  Jill Alexanders, [facility] Surgery 01/26/2016, 10:11 AM Pager:  [phone] Consults: [phone] Mon-Fri 7:00 am-4:30 pm Sat-Sun 7:00 am-11:30 am

## 2016-01-27 ENCOUNTER — Inpatient Hospital Stay (HOSPITAL_COMMUNITY): Payer: Medicare Other

## 2016-01-27 DIAGNOSIS — R000 Tachycardia, unspecified: Secondary | ICD-10-CM

## 2016-01-27 DIAGNOSIS — R197 Diarrhea, unspecified: Secondary | ICD-10-CM

## 2016-01-27 DIAGNOSIS — J449 Chronic obstructive pulmonary disease, unspecified: Secondary | ICD-10-CM

## 2016-01-27 LAB — BASIC METABOLIC PANEL
Anion gap: 10 (ref 5–15)
BUN: 17 mg/dL (ref 6–20)
CO2: 36 mmol/L — ABNORMAL HIGH (ref 22–32)
CREATININE: 1.03 mg/dL — AB (ref 0.44–1.00)
Calcium: 8.3 mg/dL — ABNORMAL LOW (ref 8.9–10.3)
Chloride: 93 mmol/L — ABNORMAL LOW (ref 101–111)
GFR, EST AFRICAN AMERICAN: 60 mL/min — AB (ref >60)
GFR, EST NON AFRICAN AMERICAN: 51 mL/min — AB (ref >60)
Glucose, Bld: 92 mg/dL (ref 65–99)
Potassium: 3.8 mmol/L (ref 3.5–5.1)
SODIUM: 139 mmol/L (ref 135–145)

## 2016-01-27 LAB — CBC
HCT: 29 % — ABNORMAL LOW (ref 36.0–46.0)
Hemoglobin: 8.9 g/dL — ABNORMAL LOW (ref 12.0–15.0)
MCH: 29.7 pg (ref 26.0–34.0)
MCHC: 30.7 g/dL (ref 30.0–36.0)
MCV: 96.7 fL (ref 78.0–100.0)
PLATELETS: 207 10*3/uL (ref 150–400)
RBC: 3 MIL/uL — ABNORMAL LOW (ref 3.87–5.11)
RDW: 16.6 % — AB (ref 11.5–15.5)
WBC: 4.3 10*3/uL (ref 4.0–10.5)

## 2016-01-27 LAB — HEMOGLOBIN AND HEMATOCRIT, BLOOD
HEMATOCRIT: 29 % — AB (ref 36.0–46.0)
HEMATOCRIT: 29.4 % — AB (ref 36.0–46.0)
HEMOGLOBIN: 8.8 g/dL — AB (ref 12.0–15.0)
HEMOGLOBIN: 8.9 g/dL — AB (ref 12.0–15.0)

## 2016-01-27 LAB — PROCALCITONIN: PROCALCITONIN: 0.17 ng/mL

## 2016-01-27 MED ORDER — IOPAMIDOL (ISOVUE-300) INJECTION 61%
INTRAVENOUS | Status: AC
  Administered 2016-01-27: 450 mL
  Filled 2016-01-27: qty 450

## 2016-01-27 MED ORDER — METOPROLOL TARTRATE 12.5 MG HALF TABLET
12.5000 mg | ORAL_TABLET | Freq: Two times a day (BID) | ORAL | Status: DC
  Administered 2016-01-27 – 2016-01-28 (×3): 12.5 mg via ORAL
  Filled 2016-01-27 (×3): qty 1

## 2016-01-27 MED ORDER — IOPAMIDOL (ISOVUE-300) INJECTION 61%
450.0000 mL | Freq: Once | INTRAVENOUS | Status: AC | PRN
  Administered 2016-01-27: 450 mL

## 2016-01-27 MED ORDER — FUROSEMIDE 20 MG PO TABS
20.0000 mg | ORAL_TABLET | Freq: Every day | ORAL | Status: DC
  Administered 2016-01-27 – 2016-01-28 (×2): 20 mg via ORAL
  Filled 2016-01-27 (×2): qty 1

## 2016-01-27 NOTE — Progress Notes (Signed)
Progress Note: General Surgery Service   Subjective: Continued diarrhea, no abdominal pain  Objective: Vital signs in last 24 hours: Temp:  [97.9 F (36.6 C)-98.2 F (36.8 C)] 98.1 F (36.7 C) (01/17 0419) Pulse Rate:  [70-83] 70 (01/17 0419) Resp:  [15-22] 17 (01/17 0300) BP: (91-129)/(44-61) 98/58 (01/17 0419) SpO2:  [95 %-100 %] 96 % (01/17 0825) Weight:  [60.8 kg (134 lb)] 60.8 kg (134 lb) (01/17 0419) Last BM Date: 01/26/16  Intake/Output from previous day: 01/16 0701 - 01/17 0700 In: 600 [P.O.:597; I.V.:3] Out: 320 [Urine:320] Intake/Output this shift: No intake/output data recorded.  Lungs: CTAB  Cardiovascular: RRR  Abd: soft, NT, ND  Extremities: no edema  Neuro: AOx4  Lab Results: CBC   Recent Labs  01/25/16 0252  01/26/16 2117 01/27/16 0424  WBC 9.4  --   --  4.3  HGB 8.7*  < > 8.4* 8.8*  8.9*  HCT 28.4*  < > 27.8* 29.0*  29.0*  PLT 187  --   --  207  < > = values in this interval not displayed. BMET  Recent Labs  01/26/16 1518 01/27/16 0424  NA 136 139  K 3.7 3.8  CL 95* 93*  CO2 34* 36*  GLUCOSE 136* 92  BUN 17 17  CREATININE 0.98 1.03*  CALCIUM 7.8* 8.3*   PT/INR No results for input(s): LABPROT, INR in the last 72 hours. ABG No results for input(s): PHART, HCO3 in the last 72 hours.  Invalid input(s): PCO2, PO2  Studies/Results:  Anti-infectives: Anti-infectives    None      Medications: Scheduled Meds: . Chlorhexidine Gluconate Cloth  6 each Topical Q0600  . digoxin  0.125 mg Oral Daily  . feeding supplement (ENSURE ENLIVE)  237 mL Oral BID BM  . flecainide  50 mg Oral Q12H  . levalbuterol  0.63 mg Nebulization BID  . metoprolol tartrate  12.5 mg Oral BID  . mometasone-formoterol  2 puff Inhalation BID  . mupirocin ointment  1 application Nasal BID  . pantoprazole  40 mg Oral Q0600  . potassium chloride  40 mEq Oral Once  . sodium chloride flush  3 mL Intravenous Q12H   Continuous Infusions: PRN  Meds:.acetaminophen, ALPRAZolam, bisacodyl, hydroxypropyl methylcellulose / hypromellose, ipratropium, levalbuterol, loperamide, [DISCONTINUED] ondansetron **OR** ondansetron (ZOFRAN) IV, oxyCODONE-acetaminophen  Assessment/Plan: Patient Active Problem List   Diagnosis Date Noted  . Upper GI bleed 01/23/2016  . Fall 01/23/2016  . Blood loss anemia 01/23/2016  . Acute diastolic CHF (congestive heart failure) ([place]) 01/23/2016  . Heme + stool   . Abnormal CT scan, colon   . Paroxysmal junctional tachycardia ([place])   . Hypoxia   . At risk for adverse drug event 01/18/2016  . Subcapital fracture of right hip, closed 01/12/2016  . History of depression 12/12/2015  . Alteration consciousness 11/30/2015  . SVT (supraventricular tachycardia) ([place]) 11/27/2015  . Diarrhea 11/27/2015  . Depression 11/27/2015  . Polyneuropathy ([place]) 11/27/2015  . Renal insufficiency   . Protein-calorie malnutrition, severe 11/18/2015  . Intra-abdominal fluid collection   . Premature atrial contractions   . Functional diarrhea   . Sepsis ([place])   . Hyperthyroidism   . Hypotension   . Septic shock ([place])   . Acute encephalopathy   . Hypokalemia   . Acute delirium 11/10/2015  . Acute kidney injury ([place]) 11/10/2015  . Anemia, iron deficiency 11/10/2015  . Anxiety 11/10/2015  . Chronic pain syndrome 11/10/2015  . Recurrent UTI 11/10/2015  . Acute respiratory  failure with hypoxia ([place]) 11/10/2015  . COPD (chronic obstructive pulmonary disease) ([place]) 11/10/2015  . Acute hyperglycemia 11/10/2015  . CKD (chronic kidney disease) stage 3, GFR 30-59 ml/min 11/10/2015  . Narrow complex tachycardia ([place]) 11/10/2015  . HTN (hypertension) 11/10/2015  . Generalized weakness 11/10/2015  . Anemia   . Delirium   . Hypoxemia   . 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 I agree with Dr. Benson Norway the CT was not very helpful. I agree with barium enema for further study - I think if this is a benign stricture as last  sigmoidoscopy shows she can either take treatment to liquify stool or undergo diversion with loop colostomy versus sigmoidectomy with end colostomy, there is always a possibility of stenting, however, I think that would be technically very difficult given the redundancy of the sigmoid and likely only a temporary solution.  - I spoke with her about the possibility of a colostomy and she, as the majority of laypeople, would like to avoid it at all costs. I think if she underwent any kind of surgical procedure the plan would be for permanent colostomy given other comorbidities but in the end it would alleviate the majority of her bowel complains of diarrhea with obstipation. - Since she has no hard signs of obstruction, she could certainly continue to undergo her bowel habits in current form and hope that the stricture doesn't worsen to the point of obstruction, so while she may not require surgery during this hospitalization, she may require a procedure some time this year, perhaps medical optimization could help her perioperative complication risks   LOS: 3 days   Mickeal Skinner, MD Pg# [phone] [facility]

## 2016-01-27 NOTE — Progress Notes (Signed)
Patient Name: Dawn [name] Date of Encounter: 01/27/2016  Primary Cardiologist: Dr. Laurena Bering Problem List     Principal Problem:   Fall Active Problems:   Anxiety   Chronic pain syndrome   COPD (chronic obstructive pulmonary disease) (HCC)   Hyperthyroidism   Hypotension   SVT (supraventricular tachycardia) (HCC)   Depression   Upper GI bleed   Blood loss anemia   Heme + stool   Abnormal CT scan, colon   Acute diastolic CHF (congestive heart failure) (HCC)   Paroxysmal junctional tachycardia (HCC)   Hypoxia   Subjective   Had some SOB this morning. Given a dose of IV lasix with some improvement.   Inpatient Medications    Scheduled Meds: . Chlorhexidine Gluconate Cloth  6 each Topical Q0600  . digoxin  0.125 mg Oral Daily  . feeding supplement (ENSURE ENLIVE)  237 mL Oral BID BM  . flecainide  50 mg Oral Q12H  . levalbuterol  0.63 mg Nebulization BID  . metoprolol tartrate  25 mg Oral BID  . mometasone-formoterol  2 puff Inhalation BID  . mupirocin ointment  1 application Nasal BID  . pantoprazole  40 mg Oral Q0600  . potassium chloride  40 mEq Oral Once  . sodium chloride flush  3 mL Intravenous Q12H   Continuous Infusions:  PRN Meds: acetaminophen, ALPRAZolam, bisacodyl, hydroxypropyl methylcellulose / hypromellose, ipratropium, levalbuterol, loperamide, [DISCONTINUED] ondansetron **OR** ondansetron (ZOFRAN) IV, oxyCODONE-acetaminophen   Vital Signs    Vitals:   01/26/16 2127 01/27/16 0300 01/27/16 0419 01/27/16 0825  BP:   (!) 98/58   Pulse:   70   Resp:  17    Temp: 98.2 F (36.8 C)  98.1 F (36.7 C)   TempSrc: Oral  Oral   SpO2:  99% 95% 96%  Weight:   134 lb (60.8 kg)   Height:        Intake/Output Summary (Last 24 hours) at 01/27/16 0826 Last data filed at 01/26/16 2104  Gross per 24 hour  Intake              600 ml  Output              100 ml  Net              500 ml   Filed Weights   01/25/16 0500 01/26/16 0318  01/27/16 0419  Weight: 141 lb 9.6 oz (64.2 kg) 134 lb 9.6 oz (61.1 kg) 134 lb (60.8 kg)    Physical Exam    GEN: Thin older female, ill appearing in no acute distress.  HEENT: Grossly normal.  Neck: Supple, no JVD, carotid bruits, or masses. Cardiac: RRR, no murmurs, rubs, or gallops. No clubbing, cyanosis, edema.  Radials/DP/PT 2+ and equal bilaterally.  Respiratory:  Respirations regular and unlabored, clear to auscultation bilaterally. GI: Soft, nontender, nondistended, BS + x 4. MS: no deformity or atrophy. Skin: warm and dry, no rash. Neuro:  Strength and sensation are intact. Psych: AAOx3.  Normal affect.  Labs    CBC  Recent Labs  01/25/16 0252  01/26/16 2117 01/27/16 0424  WBC 9.4  --   --  4.3  HGB 8.7*  < > 8.4* 8.8*  8.9*  HCT 28.4*  < > 27.8* 29.0*  29.0*  MCV 95.0  --   --  96.7  PLT 187  --   --  207  < > = values in this interval not displayed. Basic Metabolic Panel  Recent Labs  01/26/16 1518 01/27/16 0424  NA 136 139  K 3.7 3.8  CL 95* 93*  CO2 34* 36*  GLUCOSE 136* 92  BUN 17 17  CREATININE 0.98 1.03*  CALCIUM 7.8* 8.3*   Liver Function Tests No results for input(s): AST, ALT, ALKPHOS, BILITOT, PROT, ALBUMIN in the last 72 hours. No results for input(s): CKTOTAL, CKMB, CKMBINDEX, TROPONINI in the last 72 hours.  Recent Labs  01/24/16 1310  T3FREE 0.9*    Telemetry    SR with freq PVCs, PACs - Personally Reviewed  ECG    N/A - Personally Reviewed  Radiology    Ct Abdomen Pelvis Wo Contrast  Result Date: 01/26/2016 CLINICAL DATA:  Diarrhea EXAM: CT ABDOMEN AND PELVIS WITHOUT CONTRAST TECHNIQUE: Multidetector CT imaging of the abdomen and pelvis was performed following the standard protocol without IV contrast. COMPARISON:  01/24/2016 FINDINGS: Lower chest: Stable cardiomegaly with aortic atherosclerosis. No significant pericardial effusion. Stable bilateral small pleural effusions with atelectasis. Hepatobiliary: No focal  abnormality within the liver parenchyma. Gallbladder are unremarkable. No intrahepatic or extrahepatic biliary dilation. Pancreas: Stable and nonacute Spleen: No splenomegaly.  No focal mass lesion. Adrenals/Urinary Tract: No adrenal mass or nodule. Bilateral renal cysts are again seen unchanged in appearance the largest is on the left measuring 7.1 cm in diameter. No hydroureteronephrosis. No genitourinary calculi. Bladder is partially obscured by streak artifacts from the patient's right hip arthroplasty. Bladder is physiologically distended without apparent focal mass. Stomach/Bowel: The stomach is not distended. There is normal small bowel rotation. Contrast has opacified the appendix and has centered large bowel. There is mild non-opacified small bowel distention involving jejunum and distal ileum without definite source of obstruction. On current exam, there is a short segmental area of transmural thickening involving the descending colon, best seen on series 5, image 39 and axial series 2, image 59. Beyond this, there is only a small amount of enteric contrast noted extending into a moderately thickened appearing rectum some which may be due to underdistention. Mid mid pelvic 6 x 2.9 cm area medial to the sigmoid colon with mixed attenuation and gas is more homogeneous in appearance with decrease in gas noted within. It is still uncertain whether this represents extraluminal collection, intramural abscess or focal wall thickening. A similar appearance to the contralateral side outlining a thin stream of contrast, series 2, image 64 measuring up to 2 cm in thickness. No Vascular/Lymphatic: Abdominal aortic atherosclerosis without aneurysm. No lymphadenopathy. Reproductive: Nonacute. Other:  Mild diffuse body wall edema of the abdomen and pelvis. Musculoskeletal: Right hip arthroplasty. IMPRESSION: Segmental areas of descending and sigmoid colonic thickening is suggested more so in the sigmoid. As there is only  a scant amount of enteric contrast that has reached the rectum at the time of this study, further characterization is not possible. Differential possibilities may include rib fractures or inflammatory thickening, intramural abscess of the sigmoid more so along the medial aspect, redundant non-opacified adjacent loops of bowel, neoplasm or paracolic abscess. Further workup has initially stated with rectal water soluble contrast and repeat CT versus traditional fluoroscopic contrast enema may prove helpful. Electronically Signed   By: Ashley Royalty M.D.   On: 01/26/2016 15:13    Cardiac Studies   None  Patient Profile     76 yo female with PMH of  COPD, anemia, HTN, Hyperthyroidism, CKD stage III andhip surgery who presented to the ED after a fall and noted to have runs of SVT.    Assessment &  Plan    1. SVT - Interviewing telemetry and her EKGs, she is in sinus tachycardia most of the time, but does have episodes of SVT with rates close to 150 bpm. At times it appears that these episodes are due to atrial tachycardia, but there are also times P waves are difficult to distinguish. -- she was started on metoprolol 25 mg as well as flecainide, HR has improved. Blood pressures have been soft, will reduce metoprolol to 12.'5mg'$  BID and follow HR.   2. Acute diastolic CHF - some dyspnea this morning. Given a dose of IV lasix with some improvement. Does not seem volume overloaded this morning. Would continue with '20mg'$  PO daily.   3. S/p Fall - Head CT neg for acute bleed. She denies syncope.   4. Hypotension - blood pressure much improved.   5. Heme + stool - Per primary service, GI following. Hgb remains around 8   6. Hypokalemia - resolved.  Reino Bellis, NP 01/27/2016   The patient was seen, examined and discussed with Reino Bellis, NP-C and I agree with the above.   Dawn Mohiuddin Haithcockis a 76 y.o. femalewith a hx of with a hx of COPD, hyperthyroidism. She was noted to have SVT  during an admission for urosepsis in 11/2015. The monitor is not completed yet but there are notes in the chart that an episode of SVT was noted 12/22. 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 No strips are available for review in her chart. There are several ECGs with SR or ST with very frequent PACs in a pattern of bigeminy. Medical Rx was continued with beta-blocker therapy. She has been experiencing palpitations at home and Dr Tamala Julian was planning to start a 30 day event monitor.   She was then admitted 1/1-1/5 with R hip fracture after suffering a fall. She underwent hemiarthroplasty and was DC'd to [facility] for rehab. She suffered a fall today at the SNF and struck her head on the toilet.  While in the hospital telemetry shows frequent paroxysmal SVTs with HR 140 BPM. She was seen by Dr Edison Simon yesterday and was started on metoprolol and flecainide. She had 4 very brief episodes yesterday, but none since then.  She now appears euvolemic, continue lasix 20 mg po daily, she has h/o COPD, continue nebulizers. K has been replaced. K 3.8 today.  We will decrease metoprolol back to 12.5 mg po BID as she is hypotensive.  I would start lasix 20 mg po daily.  We will sign off, call us with questions.   Ena Dawley 01/27/2016

## 2016-01-27 NOTE — Progress Notes (Signed)
PROGRESS NOTE  Dawn [name] VZD:[id number] DOB: [birth year] DOA: 01/23/2016 PCP: Dawn Garbe, MD   LOS: 3 days   Brief Narrative: Dawn [name] is a 76 y.o. female with medical history significant of COPD, anemia, HTN, Hyperthyroidism, CKD stage III and hip surgery who presented to the Emergency Department from [place] where she is recovering after her hip surgery. Patient fell when trying to get up off the toilet and hit the back of her head against the toilet.    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 CT scan done 1/14 showed "complex anatomy in the region of the sigmoid colon with a very redundant / tortuous sigmoid segment and multiple areas that could represent paracolonic abscess". Clinically she does not appear to have an infection, her white count is normal, and she has no abdominal pain on exam. C. difficile negative -Water enema; Stricture in the sigmoid colon. 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 No extravasation. No obstruction of the colon  Sinus tachycardia/SVT - Cardiology was consulted, EP evaluated patient. She was started on metoprolol and flecainide. Rate is overall improved, continue current regimen  Acute on chronic diastolic CHF - Most recent 2-D echo was done on 01/14/2016, showed normal ejection fraction of 50-55%. Chest x-ray on admission showed pulmonary vascular congestion, and  she is clinically fluid overloaded.  Received one dose of IV lasix.  Now on oral lasix.   GI bleeding - GI following. Hb overall stable, appears to have gastritis while on Aspirin post hip fracture. Continue PPI. Advance diet. No plans for EGD  Fall - unclear if patient had a syncopal episode vs pure mechanical fall vs opioid overdose, resolved, she is back to baseline today. ?related to her arrhythmia.  Exacerbation of chronic anemia associated with GI bleed (blood loss anemia) - So far hemoglobin is stable, continue to monitory H&H. No apparent plans for EGD  Depression/anxiety - Continue Wellbutrin and  Xanax (prn)  Hyperthyroidism - Continue Methimazole. TSH 3.45, free T4 normal. Continue nebulizers   DVT prophylaxis: SCD Code Status: Full code Family Communication: no family at bedside. Will call daughter today.  Disposition Plan: SNF when ready   Consultants:   Cardiology   GI  Procedures:   None   Antimicrobials:  None    Subjective: Feels well, denies worsening dyspnea.    Objective: Vitals:   01/26/16 2127 01/27/16 0300 01/27/16 0419 01/27/16 0825  BP:   (!) 98/58   Pulse:   70   Resp:  17    Temp: 98.2 F (36.8 C)  98.1 F (36.7 C)   TempSrc: Oral  Oral   SpO2:  99% 95% 96%  Weight:   60.8 kg (134 lb)   Height:        Intake/Output Summary (Last 24 hours) at 01/27/16 1448 Last data filed at 01/27/16 1100  Gross per 24 hour  Intake  600 ml  Output                0 ml  Net              600 ml   Filed Weights   01/25/16 0500 01/26/16 0318 01/27/16 0419  Weight: 64.2 kg (141 lb 9.6 oz) 61.1 kg (134 lb 9.6 oz) 60.8 kg (134 lb)    Examination: Constitutional: NAD Vitals:   01/26/16 2127 01/27/16 0300 01/27/16 0419 01/27/16 0825  BP:   (!) 98/58   Pulse:   70   Resp:  17    Temp: 98.2 F (36.8 C)  98.1 F (36.7 C)   TempSrc: Oral  Oral   SpO2:  99% 95% 96%  Weight:   60.8 kg (134 lb)   Height:       Eyes: PERRL, lids  and conjunctivae normal. Pale appearing ENMT: Mucous membranes are dry. Respiratory: cminimal wheezing, no crackles, moves air well  Cardiovascular: Regular rate and rhythm, no murmurs / rubs / gallops. tachycardic Abdomen: no tenderness. Bowel sounds positive.  Musculoskeletal: no clubbing / cyanosis.  Skin: no rashes, lesions, ulcers. No induration Neurologic: non focal. AxOx4  Data Reviewed: I have personally reviewed following labs and imaging studies  CBC:  Recent Labs Lab 01/23/16 0516  01/24/16 0343  01/25/16 0252  01/26/16 0254 01/26/16 0957 01/26/16 1518 01/26/16 2117 01/27/16 0424  WBC 9.0  --  12.8*  --  9.4  --   --   --   --   --  4.3  NEUTROABS 7.0  --  11.4*  --   --   --   --   --   --   --   --   HGB 8.6*  < > 9.4*  < > 8.7*  < > 8.1* 8.4* 8.6* 8.4* 8.8*  8.9*  HCT 28.6*  < > 30.2*  < > 28.4*  < > 26.9* 27.5* 27.8* 27.8* 29.0*  29.0*  MCV 99.3  --  95.6  --  95.0  --   --   --   --   --  96.7  PLT 177  --  204  --  187  --   --   --   --   --  207  < > = values in this interval not displayed. Basic Metabolic Panel:  Recent Labs Lab 01/23/16 0516 01/23/16 0846 01/24/16 0343 01/25/16 0252 01/26/16 1518 01/27/16 0424  NA 141  --  140 138 136 139  K 4.1  --  3.6 2.8* 3.7 3.8  CL 108  --  101 94* 95* 93*  CO2 27  --  29 35* 34* 36*  GLUCOSE 87  --  86 91 136* 92  BUN 11  --  '11 9 17 17  '$ CREATININE 0.91  --  0.91 0.98 0.98 1.03*  CALCIUM 7.7*  --  7.8* 7.6* 7.8* 8.3*  MG  --  1.8 2.0  --   --   --   PHOS  --  3.4  --   --   --   --    GFR: Estimated Creatinine Clearance: 44.6 mL/min (by C-G formula based on SCr of 1.03 mg/dL (H)). Liver Function Tests:  Recent Labs Lab 01/23/16 0846  AST 12*  ALT 10*  ALKPHOS 94  BILITOT 0.4  PROT 4.7*  ALBUMIN 1.7*   Cardiac Enzymes:  Recent Labs Lab 01/23/16 0846 01/23/16 1425  TROPONINI <0.03 <0.03  Thyroid Function Tests:  Recent Labs  01/24/16 1449  FREET4 0.61   Anemia Panel: No  results for input(s): VITAMINB12, FOLATE, FERRITIN, TIBC, IRON, RETICCTPCT in the last 72 hours. Urine analysis:    Component Value Date/Time   COLORURINE YELLOW 01/23/2016 [place] 01/23/2016 1223   LABSPEC 1.009 01/23/2016 1223   PHURINE 6.0 01/23/2016 1223   GLUCOSEU NEGATIVE 01/23/2016 1223   HGBUR NEGATIVE 01/23/2016 [place] 01/23/2016 1223   KETONESUR NEGATIVE 01/23/2016 1223   PROTEINUR NEGATIVE 01/23/2016 1223   UROBILINOGEN 0.2 04/29/2008 2235   NITRITE NEGATIVE 01/23/2016 1223   LEUKOCYTESUR NEGATIVE 01/23/2016 1223   Sepsis Labs: Invalid input(s): PROCALCITONIN, LACTICIDVEN  Recent Results (from the past 240 hour(s))  MRSA PCR Screening     Status: Abnormal   Collection Time: 01/23/16  3:00 PM  Result Value Ref Range Status   MRSA by PCR POSITIVE (A) NEGATIVE Final    Comment:        The GeneXpert MRSA Assay (FDA approved for NASAL specimens only), is one component of a comprehensive MRSA colonization surveillance program. It is not intended to diagnose MRSA infection nor to guide or monitor treatment for MRSA infections. RESULT CALLED TO, READ BACK BY AND VERIFIED WITH: C COLOMBO,RN AT 1656 01/23/16 BY L BENFIELD   C difficile quick scan w PCR reflex     Status: None   Collection Time: 01/25/16 10:35 AM  Result Value Ref Range Status   C Diff antigen NEGATIVE NEGATIVE Final   C Diff toxin NEGATIVE NEGATIVE Final   C Diff interpretation No C. difficile detected.  Final      Radiology Studies: Ct Abdomen Pelvis Wo Contrast  Result Date: 01/26/2016 CLINICAL DATA:  Diarrhea EXAM: CT ABDOMEN AND PELVIS WITHOUT CONTRAST TECHNIQUE: Multidetector CT imaging of the abdomen and pelvis was performed following the standard protocol without IV contrast. COMPARISON:  01/24/2016 FINDINGS: Lower chest: Stable cardiomegaly with aortic atherosclerosis. No significant pericardial effusion. Stable bilateral small pleural effusions with  atelectasis. Hepatobiliary: No focal abnormality within the liver parenchyma. Gallbladder are unremarkable. No intrahepatic or extrahepatic biliary dilation. Pancreas: Stable and nonacute Spleen: No splenomegaly.  No focal mass lesion. Adrenals/Urinary Tract: No adrenal mass or nodule. Bilateral renal cysts are again seen unchanged in appearance the largest is on the left measuring 7.1 cm in diameter. No hydroureteronephrosis. No genitourinary calculi. Bladder is partially obscured by streak artifacts from the patient's right hip arthroplasty. Bladder is physiologically distended without apparent focal mass. Stomach/Bowel: The stomach is not distended. There is normal small bowel rotation. Contrast has opacified the appendix and has centered large bowel. There is mild non-opacified small bowel distention involving jejunum and distal ileum without definite source of obstruction. On current exam, there is a short segmental area of transmural thickening involving the descending colon, best seen on series 5, image 39 and axial series 2, image 59. Beyond this, there is only a small amount of enteric contrast noted extending into a moderately thickened appearing rectum some which may be due to underdistention. Mid mid pelvic 6 x 2.9 cm area medial to the sigmoid colon with mixed attenuation and gas is more homogeneous in appearance with decrease in gas noted within. It is still uncertain whether this represents extraluminal collection, intramural abscess or focal wall thickening. A similar appearance to the contralateral side outlining a thin stream of contrast, series 2, image 64 measuring up to 2 cm in thickness. No Vascular/Lymphatic: Abdominal aortic atherosclerosis without aneurysm.  No lymphadenopathy. Reproductive: Nonacute. Other:  Mild diffuse body wall edema of the abdomen and pelvis. Musculoskeletal: Right hip arthroplasty. IMPRESSION: Segmental areas of descending and sigmoid colonic thickening is suggested  more so in the sigmoid. As there is only a scant amount of enteric contrast that has reached the rectum at the time of this study, further characterization is not possible. Differential possibilities may include rib fractures or inflammatory thickening, intramural abscess of the sigmoid more so along the medial aspect, redundant non-opacified adjacent loops of bowel, neoplasm or paracolic abscess. Further workup has initially stated with rectal water soluble contrast and repeat CT versus traditional fluoroscopic contrast enema may prove helpful. Electronically Signed   By: Ashley Royalty M.D.   On: 01/26/2016 15:13   Dg Colon W/water Sol Cm  Result Date: 01/27/2016 CLINICAL DATA:  Abnormal finding GI tract on CT. Rule out stricture or diverticulitis. Question perforation. EXAM: COLON WITH WATER SOLUTION CONTRAST COMPARISON:  CT abdomen pelvis 01/26/2016 FINDINGS: Unprepped study. Large amount of stool in the colon. Limited evaluation due to water-soluble contrast and unprepped study. Preliminary KUB reveals contrast in the right colon from prior CT. Nonobstructive bowel gas pattern. Review of the prior CT reveals stricture in the sigmoid colon with possible extraluminal fluid collection. For this reason, water-soluble contrast was utilized. Note is made of recent sigmoidoscopy 11/20/2015 revealing stricture in the sigmoid colon with diverticular change but no mass lesion. Rectal catheter was placed. Water-soluble contrast was refluxed into the colon. There is a stricture in the sigmoid colon which is poorly evaluated due to retained stool and water-soluble contrast. No extravasation. Contrast flows past the stricture into the left colon. The transverse and right colon also fill with contrast. No obstruction of the colon. 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 No extravasation. No obstruction of the colon Unprepped study with  stool throughout the colon. Electronically Signed   By: Franchot Gallo M.D.   On: 01/27/2016 10:37   Scheduled Meds: . Chlorhexidine Gluconate Cloth  6 each Topical Q0600  . digoxin  0.125 mg Oral Daily  . feeding supplement (ENSURE ENLIVE)  237 mL Oral BID BM  . flecainide  50 mg Oral Q12H  . furosemide  20 mg Oral Daily  . levalbuterol  0.63 mg Nebulization BID  . metoprolol tartrate  12.5 mg Oral BID  . mometasone-formoterol  2 puff Inhalation BID  . mupirocin ointment  1 application Nasal BID  . pantoprazole  40 mg Oral Q0600  . potassium chloride  40 mEq Oral Once  . sodium chloride flush  3 mL Intravenous Q12H   Continuous Infusions:   [place], [place]  [facility] Pager [phone]  If 7PM-7AM, please contact night-coverage [email or web address] Password [facility] 01/27/2016, 2:48 PM

## 2016-01-27 NOTE — Progress Notes (Signed)
Unassigned patient Subjective: Since I last evaluated the patient, there has not been much change in her condition. Her barium enema revealed a stricture thought to be due to diverticular disease.  The study was somwhat limited due to a large amount of stool in the colon. Had 2 BM's this morning.  Objective: Vital signs in last 24 hours: Temp:  [97.9 F (36.6 C)-98.2 F (36.8 C)] 98.1 F (36.7 C) (01/17 0419) Pulse Rate:  [70-83] 70 (01/17 0419) Resp:  [15-22] 17 (01/17 0300) BP: (91-129)/(44-61) 98/58 (01/17 0419) SpO2:  [95 %-100 %] 96 % (01/17 0825) Weight:  [60.8 kg (134 lb)] 60.8 kg (134 lb) (01/17 0419) Last BM Date: 01/26/16  Intake/Output from previous day: 01/16 0701 - 01/17 0700 In: 600 [P.O.:597; I.V.:3] Out: 320 [Urine:320] Intake/Output this shift: Total I/O In: 222 [P.O.:222] Out: -   General appearance: cooperative, appears stated age, fatigued, no distress and pale Resp: clear to auscultation bilaterally Cardio: regular rate and rhythm, S1, S2 normal, no murmur, click, rub or gallop GI: soft, non-tender; bowel sounds normal; no masses,  no organomegaly  Lab Results:  Recent Labs  01/25/16 0252  01/26/16 1518 01/26/16 2117 01/27/16 0424  WBC 9.4  --   --   --  4.3  HGB 8.7*  < > 8.6* 8.4* 8.8*  8.9*  HCT 28.4*  < > 27.8* 27.8* 29.0*  29.0*  PLT 187  --   --   --  207  < > = values in this interval not displayed. BMET  Recent Labs  01/25/16 0252 01/26/16 1518 01/27/16 0424  NA 138 136 139  K 2.8* 3.7 3.8  CL 94* 95* 93*  CO2 35* 34* 36*  GLUCOSE 91 136* 92  BUN '9 17 17  '$ CREATININE 0.98 0.98 1.03*  CALCIUM 7.6* 7.8* 8.3*   C-Diff  Recent Labs  01/25/16 1035  CDIFFTOX NEGATIVE   Studies/Results: Ct Abdomen Pelvis Wo Contrast  Result Date: 01/26/2016 CLINICAL DATA:  Diarrhea EXAM: CT ABDOMEN AND PELVIS WITHOUT CONTRAST TECHNIQUE: Multidetector CT imaging of the abdomen and pelvis was performed following the standard protocol without  IV contrast. COMPARISON:  01/24/2016 FINDINGS: Lower chest: Stable cardiomegaly with aortic atherosclerosis. No significant pericardial effusion. Stable bilateral small pleural effusions with atelectasis. Hepatobiliary: No focal abnormality within the liver parenchyma. Gallbladder are unremarkable. No intrahepatic or extrahepatic biliary dilation. Pancreas: Stable and nonacute Spleen: No splenomegaly.  No focal mass lesion. Adrenals/Urinary Tract: No adrenal mass or nodule. Bilateral renal cysts are again seen unchanged in appearance the largest is on the left measuring 7.1 cm in diameter. No hydroureteronephrosis. No genitourinary calculi. Bladder is partially obscured by streak artifacts from the patient's right hip arthroplasty. Bladder is physiologically distended without apparent focal mass. Stomach/Bowel: The stomach is not distended. There is normal small bowel rotation. Contrast has opacified the appendix and has centered large bowel. There is mild non-opacified small bowel distention involving jejunum and distal ileum without definite source of obstruction. On current exam, there is a short segmental area of transmural thickening involving the descending colon, best seen on series 5, image 39 and axial series 2, image 59. Beyond this, there is only a small amount of enteric contrast noted extending into a moderately thickened appearing rectum some which may be due to underdistention. Mid mid pelvic 6 x 2.9 cm area medial to the sigmoid colon with mixed attenuation and gas is more homogeneous in appearance with decrease in gas noted within. It is still uncertain whether this  represents extraluminal collection, intramural abscess or focal wall thickening. A similar appearance to the contralateral side outlining a thin stream of contrast, series 2, image 64 measuring up to 2 cm in thickness. No Vascular/Lymphatic: Abdominal aortic atherosclerosis without aneurysm. No lymphadenopathy. Reproductive: Nonacute.  Other:  Mild diffuse body wall edema of the abdomen and pelvis. Musculoskeletal: Right hip arthroplasty. IMPRESSION: Segmental areas of descending and sigmoid colonic thickening is suggested more so in the sigmoid. As there is only a scant amount of enteric contrast that has reached the rectum at the time of this study, further characterization is not possible. Differential possibilities may include rib fractures or inflammatory thickening, intramural abscess of the sigmoid more so along the medial aspect, redundant non-opacified adjacent loops of bowel, neoplasm or paracolic abscess. Further workup has initially stated with rectal water soluble contrast and repeat CT versus traditional fluoroscopic contrast enema may prove helpful. Electronically Signed   By: Ashley Royalty M.D.   On: 01/26/2016 15:13   Dg Colon W/water Sol Cm  Result Date: 01/27/2016 CLINICAL DATA:  Abnormal finding GI tract on CT. Rule out stricture or diverticulitis. Question perforation. EXAM: COLON WITH WATER SOLUTION CONTRAST COMPARISON:  CT abdomen pelvis 01/26/2016 FINDINGS: Unprepped study. Large amount of stool in the colon. Limited evaluation due to water-soluble contrast and unprepped study. Preliminary KUB reveals contrast in the right colon from prior CT. Nonobstructive bowel gas pattern. Review of the prior CT reveals stricture in the sigmoid colon with possible extraluminal fluid collection. For this reason, water-soluble contrast was utilized. Note is made of recent sigmoidoscopy 11/20/2015 revealing stricture in the sigmoid colon with diverticular change but no mass lesion. Rectal catheter was placed. Water-soluble contrast was refluxed into the colon. There is a stricture in the sigmoid colon which is poorly evaluated due to retained stool and water-soluble contrast. No extravasation. Contrast flows past the stricture into the left colon. The transverse and right colon also fill with contrast. No obstruction of the colon.  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 No extravasation. No obstruction of the colon Unprepped study with stool throughout the colon. Electronically Signed   By: Franchot Gallo M.D.   On: 01/27/2016 10:37   Medications: I have reviewed the patient's current medications.  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 She needs to be on stool softeners on a regular basis. Agree with Dr. Kieth Brightly about surgical options. Will defer to surgery for further recommendations.  2) Anemia and guaiac positive stools.   LOS: 3 days   Chanese Hartsough 01/27/2016, 11:08 AM

## 2016-01-28 DIAGNOSIS — J441 Chronic obstructive pulmonary disease with (acute) exacerbation: Secondary | ICD-10-CM | POA: Diagnosis not present

## 2016-01-28 DIAGNOSIS — R488 Other symbolic dysfunctions: Secondary | ICD-10-CM | POA: Diagnosis not present

## 2016-01-28 DIAGNOSIS — J80 Acute respiratory distress syndrome: Secondary | ICD-10-CM | POA: Diagnosis not present

## 2016-01-28 DIAGNOSIS — S0101XA Laceration without foreign body of scalp, initial encounter: Secondary | ICD-10-CM | POA: Diagnosis not present

## 2016-01-28 DIAGNOSIS — G894 Chronic pain syndrome: Secondary | ICD-10-CM | POA: Diagnosis not present

## 2016-01-28 DIAGNOSIS — I5033 Acute on chronic diastolic (congestive) heart failure: Secondary | ICD-10-CM | POA: Diagnosis not present

## 2016-01-28 DIAGNOSIS — R197 Diarrhea, unspecified: Secondary | ICD-10-CM | POA: Diagnosis not present

## 2016-01-28 DIAGNOSIS — R933 Abnormal findings on diagnostic imaging of other parts of digestive tract: Secondary | ICD-10-CM | POA: Diagnosis not present

## 2016-01-28 DIAGNOSIS — W19XXXA Unspecified fall, initial encounter: Secondary | ICD-10-CM | POA: Diagnosis not present

## 2016-01-28 DIAGNOSIS — K5669 Other partial intestinal obstruction: Secondary | ICD-10-CM | POA: Diagnosis not present

## 2016-01-28 DIAGNOSIS — M6281 Muscle weakness (generalized): Secondary | ICD-10-CM | POA: Diagnosis not present

## 2016-01-28 DIAGNOSIS — Z23 Encounter for immunization: Secondary | ICD-10-CM | POA: Diagnosis not present

## 2016-01-28 DIAGNOSIS — I471 Supraventricular tachycardia: Secondary | ICD-10-CM | POA: Diagnosis not present

## 2016-01-28 DIAGNOSIS — R296 Repeated falls: Secondary | ICD-10-CM | POA: Diagnosis not present

## 2016-01-28 DIAGNOSIS — J449 Chronic obstructive pulmonary disease, unspecified: Secondary | ICD-10-CM | POA: Diagnosis not present

## 2016-01-28 DIAGNOSIS — I129 Hypertensive chronic kidney disease with stage 1 through stage 4 chronic kidney disease, or unspecified chronic kidney disease: Secondary | ICD-10-CM | POA: Diagnosis not present

## 2016-01-28 DIAGNOSIS — W01198A Fall on same level from slipping, tripping and stumbling with subsequent striking against other object, initial encounter: Secondary | ICD-10-CM | POA: Diagnosis not present

## 2016-01-28 DIAGNOSIS — G629 Polyneuropathy, unspecified: Secondary | ICD-10-CM | POA: Diagnosis not present

## 2016-01-28 DIAGNOSIS — S72011D Unspecified intracapsular fracture of right femur, subsequent encounter for closed fracture with routine healing: Secondary | ICD-10-CM | POA: Diagnosis not present

## 2016-01-28 DIAGNOSIS — R1311 Dysphagia, oral phase: Secondary | ICD-10-CM | POA: Diagnosis not present

## 2016-01-28 DIAGNOSIS — K56609 Unspecified intestinal obstruction, unspecified as to partial versus complete obstruction: Secondary | ICD-10-CM | POA: Diagnosis not present

## 2016-01-28 DIAGNOSIS — Z9189 Other specified personal risk factors, not elsewhere classified: Secondary | ICD-10-CM | POA: Diagnosis not present

## 2016-01-28 DIAGNOSIS — F329 Major depressive disorder, single episode, unspecified: Secondary | ICD-10-CM | POA: Diagnosis not present

## 2016-01-28 DIAGNOSIS — Z9181 History of falling: Secondary | ICD-10-CM | POA: Diagnosis not present

## 2016-01-28 DIAGNOSIS — K922 Gastrointestinal hemorrhage, unspecified: Secondary | ICD-10-CM | POA: Diagnosis not present

## 2016-01-28 DIAGNOSIS — Y92121 Bathroom in nursing home as the place of occurrence of the external cause: Secondary | ICD-10-CM | POA: Diagnosis not present

## 2016-01-28 DIAGNOSIS — K56699 Other intestinal obstruction unspecified as to partial versus complete obstruction: Secondary | ICD-10-CM | POA: Diagnosis not present

## 2016-01-28 DIAGNOSIS — W19XXXD Unspecified fall, subsequent encounter: Secondary | ICD-10-CM | POA: Diagnosis not present

## 2016-01-28 DIAGNOSIS — E0590 Thyrotoxicosis, unspecified without thyrotoxic crisis or storm: Secondary | ICD-10-CM | POA: Diagnosis not present

## 2016-01-28 DIAGNOSIS — N183 Chronic kidney disease, stage 3 (moderate): Secondary | ICD-10-CM | POA: Diagnosis not present

## 2016-01-28 LAB — BASIC METABOLIC PANEL
Anion gap: 12 (ref 5–15)
BUN: 18 mg/dL (ref 6–20)
CO2: 32 mmol/L (ref 22–32)
Calcium: 8.4 mg/dL — ABNORMAL LOW (ref 8.9–10.3)
Chloride: 94 mmol/L — ABNORMAL LOW (ref 101–111)
Creatinine, Ser: 0.96 mg/dL (ref 0.44–1.00)
GFR calc Af Amer: >60 mL/min (ref >60)
GFR calc non Af Amer: 56 mL/min — ABNORMAL LOW (ref >60)
Glucose, Bld: 79 mg/dL (ref 65–99)
Potassium: 4.3 mmol/L (ref 3.5–5.1)
Sodium: 138 mmol/L (ref 135–145)

## 2016-01-28 LAB — CBC
HCT: 30.5 % — ABNORMAL LOW (ref 36.0–46.0)
HEMOGLOBIN: 9.2 g/dL — AB (ref 12.0–15.0)
MCH: 29.2 pg (ref 26.0–34.0)
MCHC: 30.2 g/dL (ref 30.0–36.0)
MCV: 96.8 fL (ref 78.0–100.0)
Platelets: 227 10*3/uL (ref 150–400)
RBC: 3.15 MIL/uL — AB (ref 3.87–5.11)
RDW: 16.6 % — ABNORMAL HIGH (ref 11.5–15.5)
WBC: 4.6 10*3/uL (ref 4.0–10.5)

## 2016-01-28 MED ORDER — HYPROMELLOSE (GONIOSCOPIC) 2.5 % OP SOLN
1.0000 [drp] | Freq: Three times a day (TID) | OPHTHALMIC | Status: DC | PRN
  Administered 2016-01-28: 1 [drp] via OPHTHALMIC
  Filled 2016-01-28: qty 15

## 2016-01-28 MED ORDER — FLUOXETINE HCL 10 MG PO CAPS: 10.0000 mg | ORAL_CAPSULE | Freq: Every day | ORAL | 3 refills | Status: DC

## 2016-01-28 MED ORDER — FUROSEMIDE 20 MG PO TABS: 20.0000 mg | ORAL_TABLET | Freq: Every day | ORAL | 0 refills | Status: DC

## 2016-01-28 MED ORDER — FLUOXETINE HCL 10 MG PO CAPS
10.0000 mg | ORAL_CAPSULE | Freq: Every day | ORAL | Status: DC
  Administered 2016-01-28: 10 mg via ORAL
  Filled 2016-01-28: qty 1

## 2016-01-28 MED ORDER — ALPRAZOLAM 0.5 MG PO TABS: 0.5000 mg | ORAL_TABLET | Freq: Two times a day (BID) | ORAL | 0 refills | Status: DC | PRN

## 2016-01-28 MED ORDER — LEVALBUTEROL HCL 1.25 MG/0.5ML IN NEBU: 1.2500 mg | INHALATION_SOLUTION | Freq: Four times a day (QID) | RESPIRATORY_TRACT | 12 refills | Status: DC | PRN

## 2016-01-28 MED ORDER — DOCUSATE SODIUM 100 MG PO CAPS: 100.0000 mg | ORAL_CAPSULE | Freq: Two times a day (BID) | ORAL | 0 refills | Status: DC | PRN

## 2016-01-28 MED ORDER — METHIMAZOLE 5 MG PO TABS
5.0000 mg | ORAL_TABLET | Freq: Every day | ORAL | Status: DC
  Administered 2016-01-28: 5 mg via ORAL
  Filled 2016-01-28: qty 1

## 2016-01-28 MED ORDER — FLECAINIDE ACETATE 50 MG PO TABS: 50.0000 mg | ORAL_TABLET | Freq: Two times a day (BID) | ORAL | 0 refills | Status: DC

## 2016-01-28 MED ORDER — ENSURE ENLIVE PO LIQD: 237.0000 mL | Freq: Two times a day (BID) | ORAL | 12 refills | Status: DC

## 2016-01-28 MED ORDER — HYDROCODONE-ACETAMINOPHEN 7.5-325 MG PO TABS: 1.0000 | ORAL_TABLET | Freq: Four times a day (QID) | ORAL | 0 refills | Status: DC | PRN

## 2016-01-28 MED ORDER — PANTOPRAZOLE SODIUM 40 MG PO TBEC: 40.0000 mg | DELAYED_RELEASE_TABLET | Freq: Every day | ORAL | 0 refills | Status: DC

## 2016-01-28 MED ORDER — DIGOXIN 125 MCG PO TABS: 0.1250 mg | ORAL_TABLET | Freq: Every day | ORAL | 0 refills | Status: DC

## 2016-01-28 MED ORDER — LEVALBUTEROL HCL 0.63 MG/3ML IN NEBU: 0.6300 mg | INHALATION_SOLUTION | Freq: Two times a day (BID) | RESPIRATORY_TRACT | 12 refills | Status: DC

## 2016-01-28 NOTE — Discharge Summary (Addendum)
Physician Discharge Summary  Dawn [name] LFY:[id number] DOB: May 02, 1940 DOA: 01/23/2016  PCP: [name], MD  Admit date: 01/23/2016 Discharge date: 01/28/2016  Admitted From: SNF Disposition:  SNF  Recommendations for Outpatient Follow-up:  1. Follow up with PCP in 1-2 weeks 2. Please obtain BMP/CBC in one week 3. Needs to follow up with sx as needed.  4. Goal is for patient to have loose stool to avoid obstruction.    Discharge Condition: stable.  CODE STATUS: full code.  Diet recommendation: soft Diet   Brief/Interim Summary: Dawn Hoeschen Haithcockis a 76 y.o.femalewith medical history significant of COPD, anemia, HTN, Hyperthyroidism, CKD stage III andhip surgery who [place]to the [place] Department [place] is[place]over[place] after her hipsurgery. Patient fell when trying to get up off the toilet and hit the back of her head against the toilet.   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 CT scan done 1/14 showed "complex anatomy in the region of the sigmoid colon with a very redundant / tortuous sigmoid segment and multiple areas that could represent paracolonic abscess". Clinically she does not appear to have an infection, her white count is normal, and she has no abdominal pain on exam. C. difficile negative -Water enema; Stricture in the sigmoid colon. 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 No extravasation. No obstruction of the colon -surgery recommend for p[atient to have loose stool, and needs laxatives and PRN enema if patient doesn't have loose stool. Follow up with sx out patient. .   Sinus tachycardia/SVT - Cardiology was consulted, EP evaluated patient. She was started on metoprolol and flecainide. Rate is overall improved, continue current regimen stable.   Acute on chronic  diastolic CHF - Most recent 2-D echo was done on 01/14/2016, showed normal ejection fraction of 50-55%. Chest x-ray on admission showed pulmonary vascular congestion, and she is clinically fluid overloaded.  Received one dose of IV lasix.  Now on oral lasix.  stable. Cardiology sign off.   GI bleeding - GI following. Hb overall stable, appears to have gastritis while on Aspirin post hip fracture. Continue PPI. Advance diet. No plans for EGD -discontinue aspirin.   Fall - unclear if patient had a syncopal episode vs pure mechanical fall vs opioid overdose, resolved, she is back to baseline today. ?related to her arrhythmia.  Exacerbation ofchronic anemia associated with GI bleed (blood loss anemia) - So far hemoglobin is stable, continue to monitory H&H. No apparent plans for EGD hb stable.   Depression/anxiety - Xanax (prn). Wellbutrin not helping , will start fluoxetine low dose.   Hyperthyroidism - Continue Methimazole. TSH 3.45, free T4 normal. Continue nebulizers  I have try to contact daughter multiples times, she doesn't answer phone.   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;   SVT (supraventricular tachycardia) (HCC)   Fall   Anxiety   Chronic pain syndrome   COPD (chronic obstructive pulmonary disease) (HCC)   Hyperthyroidism   Hypotension    Depression   Upper GI bleed   Blood loss anemia   Heme + stool   Abnormal CT scan, colon   Acute diastolic CHF (congestive heart failure) (HCC)   Paroxysmal junctional tachycardia (HCC)   Hypoxia    Discharge Instructions  Discharge Instructions    Diet - low sodium heart healthy    Complete by:  As directed  Increase activity slowly    Complete by:  As directed      Allergies as of 01/28/2016   No Known Allergies     Medication List    STOP taking these medications   aspirin 325 MG EC tablet   buPROPion 150 MG 24 hr tablet Commonly known as:  WELLBUTRIN XL    loperamide 2 MG capsule Commonly known as:  IMODIUM   UNABLE TO FIND     TAKE these medications   ALPRAZolam 0.5 MG tablet Commonly known as:  XANAX Take 1 tablet (0.5 mg total) by mouth 2 (two) times daily as needed for anxiety.   bisacodyl 10 MG suppository Commonly known as:  DULCOLAX Place 10 mg rectally as needed for moderate constipation.   digoxin 0.125 MG tablet Commonly known as:  LANOXIN Take 1 tablet (0.125 mg total) by mouth daily. Start taking on:  01/29/2016   docusate sodium 100 MG capsule Commonly known as:  COLACE Take 1 capsule (100 mg total) by mouth 2 (two) times daily as needed for mild constipation. What changed:  when to take this  reasons to take this   feeding supplement (ENSURE ENLIVE) Liqd Take 237 mLs by mouth 2 (two) times daily between meals.   ferrous sulfate 325 (65 FE) MG tablet Take 325 mg by mouth 2 (two) times daily after a meal.   flecainide 50 MG tablet Commonly known as:  TAMBOCOR Take 1 tablet (50 mg total) by mouth every 12 (twelve) hours.   FLUoxetine 10 MG capsule Commonly known as:  PROZAC Take 1 capsule (10 mg total) by mouth daily.   furosemide 20 MG tablet Commonly known as:  LASIX Take 1 tablet (20 mg total) by mouth daily. Start taking on:  01/29/2016   gabapentin 600 MG tablet Commonly known as:  NEURONTIN Take 600 mg by mouth 3 (three) times daily.   HYDROcodone-acetaminophen 7.5-325 MG tablet Commonly known as:  NORCO Take 1 tablet by mouth every 6 (six) hours as needed for moderate pain.   INCRUSE ELLIPTA 62.5 MCG/INH Aepb Generic drug:  umeclidinium bromide Inhale 1 puff into the lungs daily.   levalbuterol 0.63 MG/3ML nebulizer solution Commonly known as:  XOPENEX Take 3 mLs (0.63 mg total) by nebulization 2 (two) times daily.   levalbuterol 1.25 MG/0.5ML nebulizer solution Commonly known as:  XOPENEX Take 1.25 mg by nebulization every 6 (six) hours as needed for wheezing or shortness of breath.    magnesium hydroxide 400 MG/5ML suspension Commonly known as:  MILK OF MAGNESIA Take 30 mLs by mouth daily as needed for mild constipation.   methimazole 5 MG tablet Commonly known as:  TAPAZOLE Take 5 mg by mouth daily.   metoprolol tartrate 25 MG tablet Commonly known as:  LOPRESSOR Take 0.5 tablets (12.5 mg total) by mouth 2 (two) times daily.   pantoprazole 40 MG tablet Commonly known as:  PROTONIX Take 1 tablet (40 mg total) by mouth daily at 6 (six) AM. Start taking on:  01/29/2016   RA SALINE ENEMA 19-7 GM/118ML Enem Place 1 each rectally as needed (for constipation).   traZODone 50 MG tablet Commonly known as:  DESYREL Take 50 mg by mouth at bedtime.       No Known Allergies  Consultations:  Surgery  Cardiology    Procedures/Studies: Ct Abdomen Pelvis Wo Contrast  Result Date: 01/26/2016 CLINICAL DATA:  Diarrhea EXAM: CT ABDOMEN AND PELVIS WITHOUT CONTRAST TECHNIQUE: Multidetector CT imaging of the abdomen and pelvis was performed  following the standard protocol without IV contrast. COMPARISON:  01/24/2016 FINDINGS: Lower chest: Stable cardiomegaly with aortic atherosclerosis. No significant pericardial effusion. Stable bilateral small pleural effusions with atelectasis. Hepatobiliary: No focal abnormality within the liver parenchyma. Gallbladder are unremarkable. No intrahepatic or extrahepatic biliary dilation. Pancreas: Stable and nonacute Spleen: No splenomegaly.  No focal mass lesion. Adrenals/Urinary Tract: No adrenal mass or nodule. Bilateral renal cysts are again seen unchanged in appearance the largest is on the left measuring 7.1 cm in diameter. No hydroureteronephrosis. No genitourinary calculi. Bladder is partially obscured by streak artifacts from the patient's right hip arthroplasty. Bladder is physiologically distended without apparent focal mass. Stomach/Bowel: The stomach is not distended. There is normal small bowel rotation. Contrast has opacified the  appendix and has centered large bowel. There is mild non-opacified small bowel distention involving jejunum and distal ileum without definite source of obstruction. On current exam, there is a short segmental area of transmural thickening involving the descending colon, best seen on series 5, image 39 and axial series 2, image 59. Beyond this, there is only a small amount of enteric contrast noted extending into a moderately thickened appearing rectum some which may be due to underdistention. Mid mid pelvic 6 x 2.9 cm area medial to the sigmoid colon with mixed attenuation and gas is more homogeneous in appearance with decrease in gas noted within. It is still uncertain whether this represents extraluminal collection, intramural abscess or focal wall thickening. A similar appearance to the contralateral side outlining a thin stream of contrast, series 2, image 64 measuring up to 2 cm in thickness. No Vascular/Lymphatic: Abdominal aortic atherosclerosis without aneurysm. No lymphadenopathy. Reproductive: Nonacute. Other:  Mild diffuse body wall edema of the abdomen and pelvis. Musculoskeletal: Right hip arthroplasty. IMPRESSION: Segmental areas of descending and sigmoid colonic thickening is suggested more so in the sigmoid. As there is only a scant amount of enteric contrast that has reached the rectum at the time of this study, further characterization is not possible. Differential possibilities may include rib fractures or inflammatory thickening, intramural abscess of the sigmoid more so along the medial aspect, redundant non-opacified adjacent loops of bowel, neoplasm or paracolic abscess. Further workup has initially stated with rectal water soluble contrast and repeat CT versus traditional fluoroscopic contrast enema may prove helpful. Electronically Signed   By: Ashley Royalty M.D.   On: 01/26/2016 15:13   Dg Chest 1 View  Result Date: 01/11/2016 CLINICAL DATA:  Right hip pain status post fall EXAM: CHEST 1  VIEW COMPARISON:  12/03/2015 FINDINGS: Lungs are hyperinflated with emphysematous changes. Probable mild fibrosis within the bilateral lung bases. No acute consolidation or effusion. Stable cardiomegaly. Atherosclerosis. No pneumothorax. Old right clavicle fracture. Multiple old right rib fractures. IMPRESSION: 1. Hyperinflation with emphysematous changes.  No acute infiltrate. 2. Stable cardiomegaly. Electronically Signed   By: Donavan Foil M.D.   On: 01/11/2016 22:09   Dg Chest 2 View  Result Date: 01/23/2016 CLINICAL DATA:  Fall tonight. Cough. Low oxygen saturation. Hypotension. Previous smoker. History of COPD. EXAM: CHEST  2 VIEW COMPARISON:  01/12/2016 FINDINGS: Cardiac enlargement with mild pulmonary vascular congestion. Interstitial changes in the lung bases may be due to chronic fibrosis or acute edema. Small bilateral pleural effusions. No focal consolidation. Emphysematous changes in the lungs. Calcification of the aorta. IMPRESSION: Cardiac enlargement with interstitial pattern possibly representing edema. Small bilateral pleural effusions. Emphysematous changes. Electronically Signed   By: Lucienne Capers M.D.   On: 01/23/2016 06:21   Dg  Abd 1 View  Result Date: 01/14/2016 CLINICAL DATA:  Abdominal distention and lower abdominal pain associated with diarrhea. Symptoms began 1 day ago. EXAM: ABDOMEN - 1 VIEW COMPARISON:  AP view of the pelvis dated January 12, 2016 and abdominal and pelvic CT scan of November 17, 2015 FINDINGS: There is moderate gaseous distention of the stomach. There is no evidence of a small or large bowel obstruction. The colonic stool burden is moderate. No free extraluminal gas collections are observed. There is degenerative change in levocurvature of the lumbar spine. 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 There are calcifications in the wall of the abdominal aorta as well as within the splenic artery. IMPRESSION: There is moderate gaseous distention of the stomach. No  evidence of small or large bowel obstruction. Moderately increased colonic stool burden may reflect constipation in the appropriate clinical setting. It may be useful to consider the patient for abdominal and pelvic CT scanning. Abdominal aortic atherosclerosis. Electronically Signed   By: David  Martinique M.D.   On: 01/14/2016 14:55   Ct Head Wo Contrast  Result Date: 01/23/2016 CLINICAL DATA:  Patient fell, striking back of head. Laceration to the occipital and right parietal region. Patient is lethargic. EXAM: CT HEAD WITHOUT CONTRAST CT CERVICAL SPINE WITHOUT CONTRAST TECHNIQUE: Multidetector CT imaging of the head and cervical spine was performed following the standard protocol without intravenous contrast. Multiplanar CT image reconstructions of the cervical spine were also generated. COMPARISON:  MRI brain 11/30/2015. CT cervical spine 11/30/2015. CT head 11/30/2015. FINDINGS: CT HEAD FINDINGS Brain: Diffuse cerebral atrophy. Ventricular dilatation consistent with central atrophy. Low-attenuation changes in the deep white matter consistent with small vessel ischemia. No abnormal extra-axial fluid collections. No mass effect or midline shift. Gray-white matter junctions are distinct. Basal cisterns are not effaced. No acute intracranial hemorrhage. Vascular: Vascular calcifications. The basilar tip appears prominent. This may be due to tortuosity but could indicate an intracranial aneurysm, measuring 6 mm. No evidence of acute hemorrhage. Skull: Normal. Negative for fracture or focal lesion. Sinuses/Orbits: Mucosal thickening in the maxillary antra with partial opacification of the left maxillary antrum, similar to prior study. Mastoid air cells are not opacified. Old nasal bone deformities. Other: Subcutaneous scalp laceration over the posterior occipital region. CT CERVICAL SPINE FINDINGS Alignment: There is reversal of the usual cervical lordosis with slight retrolisthesis of C4 on C5 and C5 on C6. This is  likely due to degenerative change. The alignment is unchanged since the prior study. Normal alignment of the facet joints. C1-2 articulation appears intact. Old osseous fragments over the tip of the odontoid process with loss of the anterior odontoid space, likely degenerative. Skull base and vertebrae: No acute fracture. No primary bone lesion or focal pathologic process. Soft tissues and spinal canal: No prevertebral fluid or swelling. No visible canal hematoma. Disc levels: Degenerative changes throughout the cervical spine with narrowed interspaces and endplate hypertrophic changes, most prominent at C3-4, C4-5, C5-6, and C6-7 levels. Prominent degenerative spurring in the facet joints and uncovertebral joints causing neural foraminal bony encroachment at multiple levels. Upper chest: Emphysematous changes and fibrosis in the lung apices. Other: Circumscribed hyperdense cystic structure demonstrated in the soft tissues of the posterior neck over the C3 spinous process. This likely represents a sebaceous cyst. No change since prior study. IMPRESSION: No acute intracranial abnormalities. Chronic atrophy and small vessel ischemic changes. 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 Consider follow-up with elective CTA or MRI to confirm. No acute hemorrhage. Degenerative changes throughout the  cervical spine. Reversal of cervical lordosis is unchanged since prior study and likely degenerative. No acute displaced fractures identified Electronically Signed   By: Lucienne Capers M.D.   On: 01/23/2016 04:53   Ct Cervical Spine Wo Contrast  Result Date: 01/23/2016 CLINICAL DATA:  Patient fell, striking back of head. Laceration to the occipital and right parietal region. Patient is lethargic. EXAM: CT HEAD WITHOUT CONTRAST CT CERVICAL SPINE WITHOUT CONTRAST TECHNIQUE: Multidetector CT imaging of the head and cervical spine was performed following the standard protocol without intravenous contrast. Multiplanar CT image  reconstructions of the cervical spine were also generated. COMPARISON:  MRI brain 11/30/2015. CT cervical spine 11/30/2015. CT head 11/30/2015. FINDINGS: CT HEAD FINDINGS Brain: Diffuse cerebral atrophy. Ventricular dilatation consistent with central atrophy. Low-attenuation changes in the deep white matter consistent with small vessel ischemia. No abnormal extra-axial fluid collections. No mass effect or midline shift. Gray-white matter junctions are distinct. Basal cisterns are not effaced. No acute intracranial hemorrhage. Vascular: Vascular calcifications. The basilar tip appears prominent. This may be due to tortuosity but could indicate an intracranial aneurysm, measuring 6 mm. No evidence of acute hemorrhage. Skull: Normal. Negative for fracture or focal lesion. Sinuses/Orbits: Mucosal thickening in the maxillary antra with partial opacification of the left maxillary antrum, similar to prior study. Mastoid air cells are not opacified. Old nasal bone deformities. Other: Subcutaneous scalp laceration over the posterior occipital region. CT CERVICAL SPINE FINDINGS Alignment: There is reversal of the usual cervical lordosis with slight retrolisthesis of C4 on C5 and C5 on C6. This is likely due to degenerative change. The alignment is unchanged since the prior study. Normal alignment of the facet joints. C1-2 articulation appears intact. Old osseous fragments over the tip of the odontoid process with loss of the anterior odontoid space, likely degenerative. Skull base and vertebrae: No acute fracture. No primary bone lesion or focal pathologic process. Soft tissues and spinal canal: No prevertebral fluid or swelling. No visible canal hematoma. Disc levels: Degenerative changes throughout the cervical spine with narrowed interspaces and endplate hypertrophic changes, most prominent at C3-4, C4-5, C5-6, and C6-7 levels. Prominent degenerative spurring in the facet joints and uncovertebral joints causing neural  foraminal bony encroachment at multiple levels. Upper chest: Emphysematous changes and fibrosis in the lung apices. Other: Circumscribed hyperdense cystic structure demonstrated in the soft tissues of the posterior neck over the C3 spinous process. This likely represents a sebaceous cyst. No change since prior study. IMPRESSION: No acute intracranial abnormalities. Chronic atrophy and small vessel ischemic changes. 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 Consider follow-up with elective CTA or MRI to confirm. No acute hemorrhage. Degenerative changes throughout the cervical spine. Reversal of cervical lordosis is unchanged since prior study and likely degenerative. No acute displaced fractures identified Electronically Signed   By: Lucienne Capers M.D.   On: 01/23/2016 04:53   Ct Abdomen Pelvis W Contrast  Result Date: 01/24/2016 CLINICAL DATA:  Colon stricture EXAM: CT ABDOMEN AND PELVIS WITH CONTRAST TECHNIQUE: Multidetector CT imaging of the abdomen and pelvis was performed using the standard protocol following bolus administration of intravenous contrast. CONTRAST:  145m ISOVUE-300 IOPAMIDOL (ISOVUE-300) INJECTION 61% COMPARISON:  11/17/2015 FINDINGS: Lower chest: Emphysema with bibasilar atelectasis and small bilateral pleural effusions. Heart is enlarged. Hepatobiliary: No focal abnormality within the liver parenchyma. Small area of low attenuation in the anterior liver, adjacent to the falciform ligament, is in a characteristic location for focal fatty deposition. Gallbladder are unremarkable. No intrahepatic or extrahepatic biliary dilation. Pancreas:  No focal mass lesion. No dilatation of the main duct. No intraparenchymal cyst. No peripancreatic edema. Spleen: No splenomegaly. No focal mass lesion. Adrenals/Urinary Tract: No adrenal nodule or mass. Bilateral renal cysts again noted, measuring up to 7.2 cm in the left kidney. Mild fullness noted both intrarenal collecting systems without overt  hydroureter. Circumferential bladder wall thickening is noted. Stomach/Bowel: Stomach is nondistended. No gastric wall thickening. No evidence of outlet obstruction. Duodenum is normally positioned as is the ligament of Treitz. No small bowel wall thickening. No small bowel dilatation. The terminal ileum is normal. The appendix is normal. Abnormal sigmoid colon again noted. Previous exam showed a 4.2 x 3.2 cm "Mass" between the sigmoid colon in the pelvic sidewall. This has decreased in the interval now measuring 3.4 x 2.0 cm, but contains heterogeneous attenuation and gas. Medial to the sigmoid colon is another abnormal structure measuring 6.0 x 2.9 cm, also with mixed attenuation and gas. The sigmoid colon is so tortuous and matted down in this region is difficult to determine what is sigmoid colon and what may be para colonic mass or abscess. There is a collection of stool appearing material between the cervix and the rectum that cannot be confirmed to be intraluminal. Vascular/Lymphatic: There is abdominal aortic atherosclerosis without aneurysm. There is no gastrohepatic or hepatoduodenal ligament lymphadenopathy. No intraperitoneal or retroperitoneal lymphadenopathy. No pelvic sidewall lymphadenopathy. Reproductive: There appears to be marked wall thickening in the vagina, but the areas not well seen secondary to substantial streak artifact from the right hip replacement. Musculoskeletal: There is prominent perirectal edema. Diffuse body wall edema is seen throughout the visualized portion of the abdomen and pelvis. Patient is status post right hip hemiarthroplasty. IMPRESSION: 1. Complex anatomy in the region of the sigmoid colon with a very redundant/tortuous sigmoid segment and multiple areas that could represent paracolonic abscess. There is material that appears to be stool in the cul-de-sac region that cannot be confirmed to be intraluminal as the rectum appears to pass posterior to this collection.  Colonoscopy back in November was unsuccessful at passing through this area of colon. CT imaging through the pelvis after allowing for migration of oral contrast into the distal rectum may prove helpful. Alternatively, repeat CT before and after administration of rectal water-soluble contrast may prove helpful. This anatomy may be very difficult to tease out by traditional fluoroscopic contrast enema, but that also might be an option. Diverticulitis with abscess and neoplasm could both have this appearance. 2. Diffuse body wall edema 3. Advanced abdominal aortic atherosclerosis. 4. Mild bilateral hydronephrosis with out substantial ureteral dilatation. There does appear to be circumferential bladder wall thickening. 5. Marked vaginal wall thickening. Electronically Signed   By: Misty Stanley M.D.   On: 01/24/2016 20:52   Pelvis Portable  Result Date: 01/12/2016 CLINICAL DATA:  76 y/o  F; status post right hip hemiarthroplasty. EXAM: PORTABLE PELVIS 1-2 VIEWS COMPARISON:  None. FINDINGS: There is a right hip hemiarthroplasty. On the single frontal view there is no apparent hardware related complication and the femoral head component is well seated in the acetabulum. No acute fracture identified. Foley catheter. Postsurgical changes in soft tissues overlying the right hip. IMPRESSION: Right hip hemiarthroplasty with expected postsurgical changes and without apparent hardware related complication. Electronically Signed   By: [name] M.D.   On: 01/12/2016 16:18   Dg Chest Port 1 View  Result Date: 01/23/2016 CLINICAL DATA:  Hypoxia. EXAM: PORTABLE CHEST 1 VIEW COMPARISON:  Earlier today. FINDINGS: Stable enlarged cardiac  silhouette. Mild increase in prominence of the pulmonary vasculature with stable prominence of the interstitial markings. Mild scoliosis. IMPRESSION: Stable cardiomegaly with mildly progressive pulmonary vascular congestion and stable interstitial pulmonary edema. Electronically  Signed   By: Claudie Revering M.D.   On: 01/23/2016 13:45   Dg Chest Port 1 View  Result Date: 01/12/2016 CLINICAL DATA:  Initial evaluation for acute hypoxia. History of right hip surgery today. History of COPD. EXAM: PORTABLE CHEST 1 VIEW COMPARISON:  Prior radiograph from 01/11/2016. FINDINGS: Cardiomegaly is stable from prior. Mediastinal silhouette within normal limits. Aortic atherosclerosis noted. Lungs are hyperinflated with underlying changes compatible with COPD. There is diffusely increased interstitial prominence with a few scattered Kerley B-lines, suggesting pulmonary interstitial edema. A small left pleural effusion is present. No focal infiltrates identified. No pneumothorax. No acute osseous abnormality.  Osteopenia noted. IMPRESSION: 1. Stable cardiomegaly with findings suggestive of mild diffuse pulmonary interstitial edema. 2. Small left pleural effusion. 3. Underlying COPD. 4. Aortic atherosclerosis. Electronically Signed   By: Jeannine Boga M.D.   On: 01/12/2016 22:26   Dg Hip Unilat W Or Wo Pelvis 2-3 Views Right  Result Date: 01/11/2016 CLINICAL DATA:  [place] at home while walking from continue bedroom. EXAM: DG HIP (WITH OR WITHOUT PELVIS) 2-3V RIGHT COMPARISON:  None FINDINGS: There is an acute subcapital right hip fracture with mild varus angulation. No dislocation. No radiographic findings to suggest a pathologic basis for the fracture. IMPRESSION: Subcapital right hip fracture Electronically Signed   By: Andreas Newport M.D.   On: 01/11/2016 22:08   Dg Colon W/water Sol Cm  Result Date: 01/27/2016 CLINICAL DATA:  Abnormal finding GI tract on CT. Rule out stricture or diverticulitis. Question perforation. EXAM: COLON WITH WATER SOLUTION CONTRAST COMPARISON:  CT abdomen pelvis 01/26/2016 FINDINGS: Unprepped study. Large amount of stool in the colon. Limited evaluation due to water-soluble contrast and unprepped study. Preliminary KUB reveals contrast in the right colon from  prior CT. Nonobstructive bowel gas pattern. Review of the prior CT reveals stricture in the sigmoid colon with possible extraluminal fluid collection. For this reason, water-soluble contrast was utilized. Note is made of recent sigmoidoscopy 11/20/2015 revealing stricture in the sigmoid colon with diverticular change but no mass lesion. Rectal catheter was placed. Water-soluble contrast was refluxed into the colon. There is a stricture in the sigmoid colon which is poorly evaluated due to retained stool and water-soluble contrast. No extravasation. Contrast flows past the stricture into the left colon. The transverse and right colon also fill with contrast. No obstruction of the colon. 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 No extravasation. No obstruction of the colon Unprepped study with stool throughout the colon. Electronically Signed   By: Franchot Gallo M.D.   On: 01/27/2016 10:37     Subjective: Complaining of itching of her eyes. Denies vision changes.  Had multiples BM.   Discharge Exam: Vitals:   01/27/16 2136 01/28/16 0500  BP: (!) 112/54 93/60  Pulse: 82 72  Resp: 20 15  Temp: 98.3 F (36.8 C) 98 F (36.7 C)   Vitals:   01/27/16 0825 01/27/16 2119 01/27/16 2136 01/28/16 0500  BP:   (!) 112/54 93/60  Pulse:  73 82 72  Resp:  (!) '23 20 15  '$ Temp:   98.3 F (36.8 C) 98 F (36.7 C)  TempSrc:   Oral Oral  SpO2: 96% 100% 100% 100%  Weight:    55.5  kg (122 lb 4.8 oz)  Height:        General: Pt is alert, awake, not in acute distress Cardiovascular: RRR, S1/S2 +, no rubs, no gallops Respiratory: CTA bilaterally, no wheezing, no rhonchi Abdominal: Soft, NT, ND, bowel sounds + Extremities: no edema, no cyanosis    The results of significant diagnostics from this hospitalization (including imaging, microbiology, ancillary and laboratory) are listed below for reference.      Microbiology: Recent Results (from the past 240 hour(s))  MRSA PCR Screening     Status: Abnormal   Collection Time: 01/23/16  3:00 PM  Result Value Ref Range Status   MRSA by PCR POSITIVE (A) NEGATIVE Final    Comment:        The GeneXpert MRSA Assay (FDA approved for NASAL specimens only), is one component of a comprehensive MRSA colonization surveillance program. It is not intended to diagnose MRSA infection nor to guide or monitor treatment for MRSA infections. RESULT CALLED TO, READ BACK BY AND VERIFIED WITH: C COLOMBO,RN AT 1656 01/23/16 BY L BENFIELD   C difficile quick scan w PCR reflex     Status: None   Collection Time: 01/25/16 10:35 AM  Result Value Ref Range Status   C Diff antigen NEGATIVE NEGATIVE Final   C Diff toxin NEGATIVE NEGATIVE Final   C Diff interpretation No C. difficile detected.  Final     Labs: BNP (last 3 results) No results for input(s): BNP in the last 8760 hours. Basic Metabolic Panel:  Recent Labs Lab 01/23/16 0846 01/24/16 0343 01/25/16 0252 01/26/16 1518 01/27/16 0424 01/28/16 0432  NA  --  140 138 136 139 138  K  --  3.6 2.8* 3.7 3.8 4.3  CL  --  101 94* 95* 93* 94*  CO2  --  29 35* 34* 36* 32  GLUCOSE  --  86 91 136* 92 79  BUN  --  '11 9 17 17 18  '$ CREATININE  --  0.91 0.98 0.98 1.03* 0.96  CALCIUM  --  7.8* 7.6* 7.8* 8.3* 8.4*  MG 1.8 2.0  --   --   --   --   PHOS 3.4  --   --   --   --   --    Liver Function Tests:  Recent Labs Lab 01/23/16 0846  AST 12*  ALT 10*  ALKPHOS 94  BILITOT 0.4  PROT 4.7*  ALBUMIN 1.7*   No results for input(s): LIPASE, AMYLASE in the last 168 hours. No results for input(s): AMMONIA in the last 168 hours. CBC:  Recent Labs Lab 01/23/16 0516  01/24/16 0343  01/25/16 0252  01/26/16 1518 01/26/16 2117 01/27/16 0424 01/27/16 1552 01/28/16 0432  WBC 9.0  --  12.8*  --  9.4  --   --   --  4.3  --  4.6  NEUTROABS 7.0  --  11.4*  --   --   --   --   --   --   --   --   HGB 8.6*   < > 9.4*  < > 8.7*  < > 8.6* 8.4* 8.8*  8.9* 8.9* 9.2*  HCT 28.6*  < > 30.2*  < > 28.4*  < > 27.8* 27.8* 29.0*  29.0* 29.4* 30.5*  MCV 99.3  --  95.6  --  95.0  --   --   --  96.7  --  96.8  PLT 177  --  204  --  187  --   --   --  207  --  227  < > = values in this interval not displayed. Cardiac Enzymes:  Recent Labs Lab 01/23/16 0846 01/23/16 1425  TROPONINI <0.03 <0.03   BNP: Invalid input(s): POCBNP CBG:  Recent Labs Lab 01/25/16 1601  GLUCAP 107*   D-Dimer No results for input(s): DDIMER in the last 72 hours. Hgb A1c No results for input(s): HGBA1C in the last 72 hours. Lipid Profile No results for input(s): CHOL, HDL, LDLCALC, TRIG, CHOLHDL, LDLDIRECT in the last 72 hours. Thyroid function studies No results for input(s): TSH, T4TOTAL, T3FREE, THYROIDAB in the last 72 hours.  Invalid input(s): FREET3 Anemia work up No results for input(s): VITAMINB12, FOLATE, FERRITIN, TIBC, IRON, RETICCTPCT in the last 72 hours. Urinalysis    Component Value Date/Time   COLORURINE YELLOW 01/23/2016 [place] 01/23/2016 1223   LABSPEC 1.009 01/23/2016 1223   PHURINE 6.0 01/23/2016 1223   GLUCOSEU NEGATIVE 01/23/2016 1223   HGBUR NEGATIVE 01/23/2016 1223   [place] 01/23/2016 1223   KETONESUR NEGATIVE 01/23/2016 1223   PROTEINUR NEGATIVE 01/23/2016 1223   UROBILINOGEN 0.2 04/29/2008 2235   NITRITE NEGATIVE 01/23/2016 1223   LEUKOCYTESUR NEGATIVE 01/23/2016 1223   Sepsis Labs Invalid input(s): PROCALCITONIN,  WBC,  LACTICIDVEN Microbiology Recent Results (from the past 240 hour(s))  MRSA PCR Screening     Status: Abnormal   Collection Time: 01/23/16  3:00 PM  Result Value Ref Range Status   MRSA by PCR POSITIVE (A) NEGATIVE Final    Comment:        The GeneXpert MRSA Assay (FDA approved for NASAL specimens only), is one component of a comprehensive MRSA colonization surveillance program. It is not intended to diagnose MRSA infection  nor to guide or monitor treatment for MRSA infections. RESULT CALLED TO, READ BACK BY AND VERIFIED WITH: C COLOMBO,RN AT 1656 01/23/16 BY L BENFIELD   C difficile quick scan w PCR reflex     Status: None   Collection Time: 01/25/16 10:35 AM  Result Value Ref Range Status   C Diff antigen NEGATIVE NEGATIVE Final   C Diff toxin NEGATIVE NEGATIVE Final   C Diff interpretation No C. difficile detected.  Final     Time coordinating discharge: Over 30 minutes  SIGNED:   Elmarie Shiley, MD  [facility] 01/28/2016, 12:25 PM Pager [phone]  If 7PM-7AM, please contact night-coverage [email or web address] Password [email or web address]

## 2016-01-28 NOTE — Care Management Note (Addendum)
Case Management Note  Patient Details  Name: Dawn [name] MRN: [id number] Date of Birth: 1940/03/31  Subjective/Objective:  Pt presented for GI Bleed. Plan for SNF. CSW assisting with disposition needs.                   Action/Plan: Plan for d/c 01-28-16 to [facility]. CSW assisting with disposition. CSW trying to contact daughter. No further needs from CM At this time.   Expected Discharge Date:  01/28/16               Expected Discharge Plan:  Skilled Nursing Facility  In-House Referral:  Clinical Social Work  Discharge planning Services  CM Consult  Post Acute Care Choice:  NA Choice offered to:  NA  DME Arranged:  N/A DME Agency:  NA  HH Arranged:  NA HH Agency:  NA  Status of Service:  Completed, signed off  If discussed at [employer] of Stay Meetings, dates discussed:    Additional Comments:  Bethena Roys, RN 01/28/2016, 12:54 PM

## 2016-01-28 NOTE — Care Management Important Message (Signed)
Important Message  Patient Details  Name: Dawn [name] MRN: [id number] Date of Birth: 1940/01/23   Medicare Important Message Given:  Yes    Bethena Roys, RN 01/28/2016, 1:06 PM

## 2016-01-28 NOTE — Clinical Social Work Note (Signed)
Clinical Social Worker facilitated patient discharge including contacting patient family and facility to confirm patient discharge plans.  Clinical information faxed to facility and family agreeable with plan.  CSW arranged ambulance transport via [facility] to [place].  RN to call report prior to discharge.  Clinical Social Worker will sign off for now as social work intervention is no longer needed. Please consult [place] again if new need arises.  Barbette Or, [place]

## 2016-01-28 NOTE — Final Consult Note (Signed)
Consultant Final Sign-Off Note    Assessment/Final recommendations  Dawn [name] is a 76 y.o. female followed by me for sigmoid stricture. She has a nonobstructing sigmoid stricture, she has watery bowel movements multiple times a day. I think her best treatment plan is to continue stool softeners with intermittent enemas to maintain liquid stools.  Wound care (if applicable):    Diet at discharge: per primary team   Activity at discharge: per primary team   Follow-up appointment:  She has a likelihood of this worsening and requiring surgery for resection of the stricture. If her symptoms worsen she can follow with me in my clinic (as needed).   Pending results:  Unresulted Labs    None       Medication recommendations: colac '100mg'$  BID, water enema as needed   Other recommendations:    Thank you for allowing [place] to participate in the care of your patient!  Please consult [place] again if you have further needs for your patient.  Arta Bruce Roshon Duell 01/28/2016 8:58 AM    Subjective  Multiple water BMs yesterday, no abdominal pain   Objective  Vital signs in last 24 hours: Temp:  [98 F (36.7 C)-98.3 F (36.8 C)] 98 F (36.7 C) (01/18 0500) Pulse Rate:  [72-82] 72 (01/18 0500) Resp:  [15-23] 15 (01/18 0500) BP: (93-112)/(54-60) 93/60 (01/18 0500) SpO2:  [100 %] 100 % (01/18 0500) Weight:  [55.5 kg (122 lb 4.8 oz)] 55.5 kg (122 lb 4.8 oz) (01/18 0500)  General: NAD Abd: soft, Nt, Nd   Pertinent labs and Studies:  Recent Labs  01/27/16 0424 01/27/16 1552 01/28/16 0432  WBC 4.3  --  4.6  HGB 8.8*  8.9* 8.9* 9.2*  HCT 29.0*  29.0* 29.4* 30.5*   BMET  Recent Labs  01/27/16 0424 01/28/16 0432  NA 139 138  K 3.8 4.3  CL 93* 94*  CO2 36* 32  GLUCOSE 92 79  BUN 17 18  CREATININE 1.03* 0.96  CALCIUM 8.3* 8.4*   No results for input(s): LABURIN in the last 72 hours. Results for orders placed or performed during the hospital encounter of 01/23/16   MRSA PCR Screening     Status: Abnormal   Collection Time: 01/23/16  3:00 PM  Result Value Ref Range Status   MRSA by PCR POSITIVE (A) NEGATIVE Final    Comment:        The GeneXpert MRSA Assay (FDA approved for NASAL specimens only), is one component of a comprehensive MRSA colonization surveillance program. It is not intended to diagnose MRSA infection nor to guide or monitor treatment for MRSA infections. RESULT CALLED TO, READ BACK BY AND VERIFIED WITH: C COLOMBO,RN AT 1656 01/23/16 BY L BENFIELD   C difficile quick scan w PCR reflex     Status: None   Collection Time: 01/25/16 10:35 AM  Result Value Ref Range Status   C Diff antigen NEGATIVE NEGATIVE Final   C Diff toxin NEGATIVE NEGATIVE Final   C Diff interpretation No C. difficile detected.  Final    Imaging: Dg Colon W/water Sol Cm  Result Date: 01/27/2016 CLINICAL DATA:  Abnormal finding GI tract on CT. Rule out stricture or diverticulitis. Question perforation. EXAM: COLON WITH WATER SOLUTION CONTRAST COMPARISON:  CT abdomen pelvis 01/26/2016 FINDINGS: Unprepped study. Large amount of stool in the colon. Limited evaluation due to water-soluble contrast and unprepped study. Preliminary KUB reveals contrast in the right colon from prior CT. Nonobstructive bowel gas pattern. Review of  the prior CT reveals stricture in the sigmoid colon with possible extraluminal fluid collection. For this reason, water-soluble contrast was utilized. Note is made of recent sigmoidoscopy 11/20/2015 revealing stricture in the sigmoid colon with diverticular change but no mass lesion. Rectal catheter was placed. Water-soluble contrast was refluxed into the colon. There is a stricture in the sigmoid colon which is poorly evaluated due to retained stool and water-soluble contrast. No extravasation. Contrast flows past the stricture into the left colon. The transverse and right colon also fill with contrast. No obstruction of the colon. IMPRESSION:  Stricture in the sigmoid colon. 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 No extravasation. No obstruction of the colon Unprepped study with stool throughout the colon. Electronically Signed   By: Franchot Gallo M.D.   On: 01/27/2016 10:37

## 2016-02-02 ENCOUNTER — Encounter: Payer: Self-pay | Admitting: Internal Medicine

## 2016-02-02 ENCOUNTER — Non-Acute Institutional Stay (SKILLED_NURSING_FACILITY): Payer: Medicare Other | Admitting: Internal Medicine

## 2016-02-02 DIAGNOSIS — K56699 Other intestinal obstruction unspecified as to partial versus complete obstruction: Secondary | ICD-10-CM | POA: Insufficient documentation

## 2016-02-02 DIAGNOSIS — R296 Repeated falls: Secondary | ICD-10-CM

## 2016-02-02 DIAGNOSIS — G629 Polyneuropathy, unspecified: Secondary | ICD-10-CM

## 2016-02-02 DIAGNOSIS — Z9189 Other specified personal risk factors, not elsewhere classified: Secondary | ICD-10-CM

## 2016-02-02 NOTE — Assessment & Plan Note (Signed)
At risk medication should be weaned and discontinued if possible

## 2016-02-02 NOTE — Progress Notes (Signed)
Facility Location: [facility]  Room Number: 322   Code Status: Full Code   PCP: [name], MD [place] [address] [place] [address]  This is a nursing facility follow up for [place] readmission within 30 days  Interim medical record and care since last [place] visit was updated with review of diagnostic studies and change in clinical status since last visit were documented.  HPI: The patient was hospitalized 1/13-1/18/18 having fallen getting off the toilet and striking her head against the toilet. She had been in the SNF following mission for hip surgery. Staples were placed for the scalp laceration. The patient was having intermittent significant diarrhea. CT scan 1/14 questioned possible paracolic abscess. The white count was normal and there was no abdominal tenderness on exam. C dif was negative. Water enema revealed restriction in the sigmoid colon. Suspect was a diverticular stricture as diverticulosis had been documented at colonoscopy. No obstruction was found. Lactulose and enemas as needed were recommended to maintain loose stools. General surgery follow-up would be pursued as an outpatient as needed. She also has had some GI bleeding related to gastritis while on aspirin following the hip fracture. PPI was continued. EGD was deferred. Hemoglobin remained stable For depression/anxiety she was started on fluoxetine as Wellbutrin was not beneficial subjectively. She was to continue Xanax as needed. Methimazole  was continued for hyperthyroidism. TSH was 3.45. Cardiology saw the patient for supraventricular tachycardia and she received flecanide and metoprolol. This is in the context of acute on chronic diastolic congestive heart failure. Chest x-ray revealed pulmonary vascular congestion. Oral Lasix was initiated. The exact etiology of the fall was unclear. Differential included syncope versus mechanical fall versus  opioid overdose. Also the possible role of  The dysrhythmia was considered. No definite cardiac or neurologic prodrome was identified. At discharge full dose aspirin, Imodium, and Welbutrin were discontinued.  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 She's had a tremor of upper extremity for at least a year. There's been no evaluation. BMs are loose. She has weakness in her arms and will drop things.  Constitutional: No fever,significant weight change, fatigue  Eyes: No redness, discharge, pain, vision change ENT/mouth: No nasal congestion,  purulent discharge, earache,change in hearing ,sore throat  Cardiovascular: No chest pain, palpitations,paroxysmal nocturnal dyspnea, claudication, edema  Respiratory: No cough, sputum production,hemoptysis, DOE , significant snoring,apnea  Gastrointestinal: No heartburn,dysphagia,abdominal pain, nausea / vomiting,rectal bleeding, melena Genitourinary: No dysuria,hematuria, pyuria,  incontinence, nocturia Musculoskeletal: No joint stiffness, joint swelling Dermatologic: No rash, pruritus, change in appearance of skin Neurologic: No dizziness,headache,syncope, seizures, numbness , tingling Endocrine: No change in hair/skin/ nails, excessive thirst, excessive hunger, excessive urination  Hematologic/lymphatic: No significant bruising, lymphadenopathy,abnormal bleeding Allergy/immunology: No itchy/ watery eyes, significant sneezing, urticaria, angioedema  Physical exam:  Pertinent or positive findings:When I walked into the room the patient was asleep with mouth agape; respiratory excursion of the chest was not apparent. She did awaken as I approached the bed.  Facies are somewhat masked. Eyebrows are missing. She has an upper partial. The mandible is edentulous. Second heart sound is increased. Slight rumbling over the right  carotid. There is dullness to percussion in the right upper quadrant. Clubbing of the nailbeds suggested. She has mild mixed arthritic changes in the hands. There is subtle weakness in the right  upper and right lower extremities compared to the left.   General appearance:Adequately nourished; no acute distress , increased work of breathing is present.   Lymphatic: No lymphadenopathy about the head, neck, axilla . Eyes: No conjunctival inflammation or lid edema is present. There is no scleral icterus. Ears:  External ear exam shows no significant lesions or deformities.   Nose:  External nasal examination shows no deformity or inflammation. Nasal mucosa are pink and moist without lesions ,exudates Oral exam: lips and gums are healthy appearing.There is no oropharyngeal erythema or exudate . Neck:  No thyromegaly, masses, tenderness noted.    Heart:  Normal rate and regular rhythm. S1 normal without gallop, murmur, click, rub .  Lungs:Chest clear to auscultation without wheezes, rhonchi,rales , rubs. Abdomen:Bowel sounds are normal. Abdomen is soft and nontender with no organomegaly, hernias,masses. GU: deferred  Extremities:  No cyanosis, clubbing,edema  Neurologic exam : Balance,Rhomberg,finger to nose testing could not be completed due to clinical state Skin: Warm & dry w/o tenting. No significant lesions or rash.    See summary under each active problem in the Problem List with associated updated therapeutic plan

## 2016-02-02 NOTE — Assessment & Plan Note (Signed)
Neurontin has been continued for the neuropathic pain related to spinal stenosis If she fails to respond to the fluoxetine, generic Cymbalta could treat depression as well as a neuropathic pain.

## 2016-02-02 NOTE — Assessment & Plan Note (Signed)
Discontinue Imodium Keep stools loose

## 2016-02-02 NOTE — Patient Instructions (Signed)
See Current Assessment & Plan in Problem List under specific Diagnosis 

## 2016-02-02 NOTE — Assessment & Plan Note (Signed)
She been taking narcotics for pain related to spinal stenosis prior to her hip fracture 01/11/16 Gabapentin would be appropriate for the neuropathic pain, opioids are contraindicated in this patient because of the risks outlined above and recurrent falls

## 2016-02-11 ENCOUNTER — Non-Acute Institutional Stay (SKILLED_NURSING_FACILITY): Payer: Medicare Other | Admitting: Internal Medicine

## 2016-02-11 ENCOUNTER — Encounter: Payer: Self-pay | Admitting: Internal Medicine

## 2016-02-11 ENCOUNTER — Other Ambulatory Visit: Payer: Medicare Other

## 2016-02-11 DIAGNOSIS — S72011A Unspecified intracapsular fracture of right femur, initial encounter for closed fracture: Secondary | ICD-10-CM

## 2016-02-11 NOTE — Assessment & Plan Note (Signed)
02/11/16 painless SQ swelling around the previous fracture. Orthopedic reconsultation is indicated to rule out fracture instability or complication.

## 2016-02-11 NOTE — Patient Instructions (Signed)
Referral to Dr Alvan Dame to evaluate the peri-fracture subcutaneous swelling. Continue to wean the narcotics and other agents which increase her risk of recurrent falls.

## 2016-02-11 NOTE — Progress Notes (Signed)
[facility] [place] [address]  This is a nursing facility follow up for specific acute issue of increasing swelling of the right hip noted by staff.  Interim medical record and care since last [place] visit was updated with review of diagnostic studies and change in clinical status since last visit were documented.  HPI: The patient  had noted swelling of the right hip when she went to the bathroom by herself. There was no associated pain or discoloration although she does have some pain with mobilization of the right hip. She also has some intermittent back pain. There was no definite trauma to this area other than the original hip fracture. She did fall while here in the SNF sustaining a laceration to the posterior scalp requiring 2 metal sutures. She denies injuring the hip at that time. Because she has  recurrent falls her opioid was decreased to every 8 hours. I explained to her that this is one of the medications which increases risk of falling with potential health or life-threatening injury. I also stressed that she not should not be going to the bathroom without help.  Review of systemsNegative except for the intermittent hip pain and the localized swelling. She specifically denies any bleeding dyscrasias. Constitutional: No fever,significant weight change, fatigue  Eyes: No redness, discharge, pain, vision change ENT/mouth: No nasal congestion,  purulent discharge, earache,change in hearing ,sore throat  Cardiovascular: No chest pain, palpitations,paroxysmal nocturnal dyspnea, claudication, edema  Respiratory: No cough, sputum production,hemoptysis, DOE , significant snoring,apnea  Gastrointestinal: No heartburn,dysphagia,abdominal pain, nausea / vomiting,rectal bleeding, melena,change in bowels Genitourinary: No dysuria,hematuria, pyuria,  incontinence, nocturia Dermatologic: No rash,  pruritus, change in appearance of skin Neurologic: No dizziness,headache,syncope, seizures, numbness , tingling Psychiatric: No significant anxiety , depression, insomnia, anorexia Endocrine: No change in hair/skin/ nails, excessive thirst, excessive hunger, excessive urination  Hematologic/lymphatic: No significant bruising, lymphadenopathy,abnormal bleeding   Physical exam:  Pertinent or positive findings: she has an upper partial. The mandible is edentulous. She has intermittent there is significant swelling at the right hip measuring 16 cm in height and 9 cm in width. This is fluctuant to palpation. No discoloration of the skin of significance. No ecchymosis. There is no significant transillumination. She has tremor of both upper extremities, greater on the left. He was able to sit up and stand up using a walker without help. She has 2 metal sutures in the posterior occiput. There is no evidence of cellulitis or purulence at this site. Laceration is well-closed.   General appearance:Adequately nourished; no acute distress , increased work of breathing is present.   Lymphatic: No lymphadenopathy about the head, neck, axilla . Eyes: No conjunctival inflammation or lid edema is present. There is no scleral icterus. Ears:  External ear exam shows no significant lesions or deformities.   Nose:  External nasal examination shows no deformity or inflammation. Nasal mucosa are pink and moist without lesions ,exudates Oral exam: lips and gums are healthy appearing.There is no oropharyngeal erythema or exudate . Neck:  No thyromegaly, masses, tenderness noted.    Heart:  Normal rate and regular rhythm. S1 normal without gallop, murmur, click, rub .  Lungs:Chest clear to auscultation without wheezes, rhonchi,rales , rubs. Breath sounds decreased. Abdomen:Bowel sounds are normal. Abdomen is soft and nontender with no organomegaly, hernias,masses. GU: deferred  Extremities:  No cyanosis, edema  Skin:  Warm & dry w/o tenting. No  significant lesions or rash.  Assessment/plan: #1 swelling right hip, probable resolving ecchymosis. Orthopedic follow-up indicated to verify stability of the fracture.  #2 recurrent falls with hip fracture and subsequent fall with head laceration. Medications on the [place] list will be weaned. The necessity of this was discussed with her.

## 2016-02-15 ENCOUNTER — Ambulatory Visit: Payer: Medicare Other | Admitting: Endocrinology

## 2016-02-23 ENCOUNTER — Encounter: Payer: Self-pay | Admitting: *Deleted

## 2016-02-23 DIAGNOSIS — S72011D Unspecified intracapsular fracture of right femur, subsequent encounter for closed fracture with routine healing: Secondary | ICD-10-CM | POA: Diagnosis not present

## 2016-02-23 DIAGNOSIS — M6281 Muscle weakness (generalized): Secondary | ICD-10-CM | POA: Diagnosis not present

## 2016-02-24 DIAGNOSIS — Z96649 Presence of unspecified artificial hip joint: Secondary | ICD-10-CM | POA: Diagnosis not present

## 2016-02-24 DIAGNOSIS — M6281 Muscle weakness (generalized): Secondary | ICD-10-CM | POA: Diagnosis not present

## 2016-02-24 DIAGNOSIS — Z96641 Presence of right artificial hip joint: Secondary | ICD-10-CM | POA: Diagnosis not present

## 2016-02-24 DIAGNOSIS — Z471 Aftercare following joint replacement surgery: Secondary | ICD-10-CM | POA: Diagnosis not present

## 2016-02-24 DIAGNOSIS — S72011D Unspecified intracapsular fracture of right femur, subsequent encounter for closed fracture with routine healing: Secondary | ICD-10-CM | POA: Diagnosis not present

## 2016-02-25 ENCOUNTER — Other Ambulatory Visit: Payer: Self-pay | Admitting: *Deleted

## 2016-02-25 DIAGNOSIS — M6281 Muscle weakness (generalized): Secondary | ICD-10-CM | POA: Diagnosis not present

## 2016-02-25 DIAGNOSIS — S72011D Unspecified intracapsular fracture of right femur, subsequent encounter for closed fracture with routine healing: Secondary | ICD-10-CM | POA: Diagnosis not present

## 2016-02-25 MED ORDER — HYDROCODONE-ACETAMINOPHEN 5-325 MG PO TABS: 1.0000 | ORAL_TABLET | Freq: Three times a day (TID) | ORAL | 0 refills | Status: DC | PRN

## 2016-02-25 NOTE — Telephone Encounter (Signed)
[facility] [phone] Fax: [phone]  

## 2016-02-26 DIAGNOSIS — S72011D Unspecified intracapsular fracture of right femur, subsequent encounter for closed fracture with routine healing: Secondary | ICD-10-CM | POA: Diagnosis not present

## 2016-02-26 DIAGNOSIS — M6281 Muscle weakness (generalized): Secondary | ICD-10-CM | POA: Diagnosis not present

## 2016-02-29 DIAGNOSIS — M6281 Muscle weakness (generalized): Secondary | ICD-10-CM | POA: Diagnosis not present

## 2016-02-29 DIAGNOSIS — S72011D Unspecified intracapsular fracture of right femur, subsequent encounter for closed fracture with routine healing: Secondary | ICD-10-CM | POA: Diagnosis not present

## 2016-03-01 ENCOUNTER — Non-Acute Institutional Stay (SKILLED_NURSING_FACILITY): Payer: Medicare Other | Admitting: Nurse Practitioner

## 2016-03-01 DIAGNOSIS — J449 Chronic obstructive pulmonary disease, unspecified: Secondary | ICD-10-CM

## 2016-03-01 DIAGNOSIS — N183 Chronic kidney disease, stage 3 unspecified: Secondary | ICD-10-CM

## 2016-03-01 DIAGNOSIS — M6281 Muscle weakness (generalized): Secondary | ICD-10-CM | POA: Diagnosis not present

## 2016-03-01 DIAGNOSIS — I471 Supraventricular tachycardia: Secondary | ICD-10-CM

## 2016-03-01 DIAGNOSIS — D509 Iron deficiency anemia, unspecified: Secondary | ICD-10-CM

## 2016-03-01 DIAGNOSIS — I952 Hypotension due to drugs: Secondary | ICD-10-CM | POA: Diagnosis not present

## 2016-03-01 DIAGNOSIS — E0590 Thyrotoxicosis, unspecified without thyrotoxic crisis or storm: Secondary | ICD-10-CM | POA: Diagnosis not present

## 2016-03-01 DIAGNOSIS — S72011D Unspecified intracapsular fracture of right femur, subsequent encounter for closed fracture with routine healing: Secondary | ICD-10-CM | POA: Diagnosis not present

## 2016-03-01 NOTE — Progress Notes (Signed)
Nursing Home Location:  [facility] Room number: 119  Place of Service: SNF (31)  PCP: [name], MD  No Known Allergies  Chief Complaint  Patient presents with  . Medical Management of Chronic Issues    Routine Visit    HPI:  Patient is a 76 y.o. female seen today at [facility] for routine follow up. Pt has been at [facility] for rehab since 01/15/16 after fall with subcapital fracture of right hip s/p right hip hemiarthroplasty on 01/12/16. Nursing notes pts bp has been low, currently holding metoprolol and bp 100/60 with HR 62.  Pt conts on 02 from hospitalization, staff has attempted to wean but pts sats drop. She has a deep nonproductive cough with shortness of breath at times.  Reports anxiety is better some days than others.   Review of Systems:  Review of Systems  Constitutional: Positive for activity change. Negative for appetite change, chills, fatigue, fever and unexpected weight change.  HENT: Negative for tinnitus.   Eyes: Negative.   Respiratory: Negative for cough, chest tightness, shortness of breath and wheezing.   Cardiovascular: Positive for palpitations and leg swelling (right). Negative for chest pain.  Gastrointestinal: Negative for abdominal pain, constipation and diarrhea.  Genitourinary: Negative for difficulty urinating, dysuria, frequency and urgency.  Musculoskeletal: Positive for arthralgias, back pain and myalgias.  Skin: Negative.  Negative for color change and wound.  Neurological: Negative for dizziness, weakness and headaches.  Psychiatric/Behavioral: Positive for dysphoric mood and sleep disturbance. Negative for agitation, behavioral problems and confusion. The patient is nervous/anxious.     Past Medical History:  Diagnosis Date  . COPD (chronic obstructive pulmonary disease) ([place])   . History of anxiety   . History of depression   . Spinal stenosis   . UTI (urinary tract infection)    Past Surgical History:    Procedure Laterality Date  . FLEXIBLE SIGMOIDOSCOPY Left 11/18/2015   Procedure: FLEXIBLE SIGMOIDOSCOPY;  Surgeon: [name], MD;  Location: [place];  Service: Endoscopy;  Laterality: Left;  . FLEXIBLE SIGMOIDOSCOPY N/A 11/20/2015   Procedure: FLEXIBLE SIGMOIDOSCOPY;  Surgeon: [name], MD;  Location: [facility] ENDOSCOPY;  Service: Endoscopy;  Laterality: N/A;  . HIP ARTHROPLASTY Right 01/12/2016   Procedure: ARTHROPLASTY BIPOLAR HIP (HEMIARTHROPLASTY);  Surgeon: [name], MD;  Location: [facility];  Service: Orthopedics;  Laterality: Right;   Social History:   reports that she has quit smoking. She has never used smokeless tobacco. She reports that she does not drink alcohol or use drugs.  Family History  Problem Relation Age of Onset  . Heart attack Mother   . Diabetes Father   . Thyroid disease Neg Hx     Medications: Patient's Medications  New Prescriptions   No medications on file  Previous Medications   ALPRAZOLAM (XANAX) 0.5 MG TABLET    Take 1 tablet (0.5 mg total) by mouth 2 (two) times daily as needed for anxiety.   BISACODYL (DULCOLAX) 10 MG SUPPOSITORY    Place 10 mg rectally as needed for moderate constipation.   DIGOXIN (LANOXIN) 0.125 MG TABLET    Take 1 tablet (0.125 mg total) by mouth daily.   DOCUSATE SODIUM (COLACE) 100 MG CAPSULE    Take 1 capsule (100 mg total) by mouth 2 (two) times daily as needed for mild constipation.   FERROUS SULFATE 325 (65 FE) MG TABLET    Take 325 mg by mouth 2 (two) times daily after a meal.   FLECAINIDE (TAMBOCOR) 50 MG  TABLET    Take 50 mg by mouth 2 (two) times daily.   FLUOXETINE (PROZAC) 10 MG TABLET    Take 10 mg by mouth daily.   GABAPENTIN (NEURONTIN) 600 MG TABLET    Take 600 mg by mouth 3 (three) times daily.   HYDROCODONE-ACETAMINOPHEN (NORCO/VICODIN) 5-325 MG TABLET    Take 1 tablet by mouth every 8 (eight) hours as needed for moderate pain.   INCRUSE ELLIPTA 62.5 MCG/INH AEPB    Inhale 1 puff into the lungs daily.    LEVALBUTEROL (XOPENEX) 0.63 MG/3ML NEBULIZER SOLUTION    Take 0.63 mg by nebulization 2 (two) times daily.   LEVALBUTEROL (XOPENEX) 1.25 MG/3ML NEBULIZER SOLUTION    Take 1.25 mg by nebulization every 6 (six) hours as needed for wheezing.   MAGNESIUM HYDROXIDE (MILK OF MAGNESIA) 400 MG/5ML SUSPENSION    Take 30 mLs by mouth daily as needed for mild constipation.   METHIMAZOLE (TAPAZOLE) 5 MG TABLET    Take 5 mg by mouth daily.   METOPROLOL TARTRATE (LOPRESSOR) 25 MG TABLET    Take 0.5 tablets (12.5 mg total) by mouth 2 (two) times daily.   PANTOPRAZOLE (PROTONIX) 40 MG TABLET    Take 40 mg by mouth daily.   SODIUM PHOSPHATES (RA SALINE ENEMA) 19-7 GM/118ML ENEM    Place 1 each rectally as needed (for constipation).   UNABLE TO FIND    Med Name: Med Pass 120 mL twice daily  Modified Medications   No medications on file  Discontinued Medications   ASPIRIN 325 MG EC TABLET    Take one tablet by mouth twice daily for 1 month for antiplatelet agent. Do not crush. Stop Date 02/12/16   BUPROPION (WELLBUTRIN XL) 150 MG 24 HR TABLET    Take 150 mg by mouth daily.   FEEDING SUPPLEMENT, ENSURE ENLIVE, (ENSURE ENLIVE) LIQD    Take 237 mLs by mouth 2 (two) times daily between meals.   FLECAINIDE (TAMBOCOR) 50 MG TABLET    Take 50 mg by mouth 2 (two) times daily.   HYDROCODONE-ACETAMINOPHEN (NORCO) 7.5-325 MG TABLET    Take 1 tablet by mouth every 6 (six) hours as needed for moderate pain.   HYDROCODONE-ACETAMINOPHEN (NORCO/VICODIN) 5-325 MG TABLET    Take 1 tablet by mouth every 8 (eight) hours as needed for moderate pain.   LOPERAMIDE (IMODIUM) 2 MG CAPSULE    Take two capsules by mouth as needed for diarrhea or loose stools   METHIMAZOLE (TAPAZOLE) 10 MG TABLET    Take 10 mg by mouth 2 (two) times daily.     Physical Exam: Vitals:   03/01/16 1113  BP: 124/85  Pulse: 80  Resp: 20  Temp: 98.2 F (36.8 C)  SpO2: 96%  Weight: 122 lb (55.3 kg)  Height: '5\' 8"'$  (1.727 m)    Physical Exam    Constitutional: She is oriented to person, place, and time. She appears well-developed. No distress.  Fail elderly female  HENT:  Head: Normocephalic and atraumatic.  Mouth/Throat: Oropharynx is clear and moist. No oropharyngeal exudate.  Eyes: Conjunctivae are normal. Pupils are equal, round, and reactive to light.  Neck: Normal range of motion. Neck supple.  Cardiovascular: Normal rate, regular rhythm and normal heart sounds.   Pulmonary/Chest: Effort normal and breath sounds normal.  Abdominal: Soft. Bowel sounds are normal.  Musculoskeletal: She exhibits no edema or tenderness.  Neurological: She is alert and oriented to person, place, and time.  Skin: Skin is warm and dry. She  is not diaphoretic.  Psychiatric: She has a normal mood and affect.    Labs reviewed: Basic Metabolic Panel:  Recent Labs  11/13/15 0129  11/14/15 0556  12/02/15 0459  01/23/16 0846 01/24/16 0343  01/26/16 1518 01/27/16 0424 01/28/16 0432  NA 139  < > 139  < > 137  137  < >  --  140  < > 136 139 138  K 3.6  < > 3.0*  < > 3.8  3.8  < >  --  3.6  < > 3.7 3.8 4.3  CL 106  --  109  < > 103  < >  --  101  < > 95* 93* 94*  CO2 25  --  24  < > 27  < >  --  29  < > 34* 36* 32  GLUCOSE 125*  --  86  < > 94  < >  --  86  < > 136* 92 79  BUN 13  < > 11  < > 8  8  < >  --  11  < > '17 17 18  '$ CREATININE 1.10*  < > 0.90  < > 0.96  1.0  < >  --  0.91  < > 0.98 1.03* 0.96  CALCIUM 7.3*  --  7.3*  < > 8.0*  < >  --  7.8*  < > 7.8* 8.3* 8.4*  MG 2.4  --  1.9  < > 1.8  --  1.8 2.0  --   --   --   --   PHOS 3.7  --  3.2  --   --   --  3.4  --   --   --   --   --   < > = values in this interval not displayed. Liver Function Tests:  Recent Labs  12/02/15 0459 12/10/15 1524 01/23/16 0846  AST 8* 8 12*  ALT 6* 6 10*  ALKPHOS 56 64 94  BILITOT 0.3 0.4 0.4  PROT 5.2* 6.8 4.7*  ALBUMIN 2.2* 3.1* 1.7*   No results for input(s): LIPASE, AMYLASE in the last 8760 hours.  Recent Labs  12/01/15 0047  AMMONIA  11   CBC:  Recent Labs  01/11/16 2145  01/23/16 0516  01/24/16 0343  01/25/16 0252  01/27/16 0424 01/27/16 1552 01/28/16 0432  WBC 10.8*  < > 9.0  --  12.8*  --  9.4  --  4.3  --  4.6  NEUTROABS 9.3*  --  7.0  --  11.4*  --   --   --   --   --   --   HGB 9.6*  < > 8.6*  < > 9.4*  < > 8.7*  < > 8.8*  8.9* 8.9* 9.2*  HCT 30.5*  < > 28.6*  < > 30.2*  < > 28.4*  < > 29.0*  29.0* 29.4* 30.5*  MCV 93.3  < > 99.3  --  95.6  --  95.0  --  96.7  --  96.8  PLT 223  < > 177  --  204  --  187  --  207  --  227  < > = values in this interval not displayed. TSH:  Recent Labs  12/01/15 1201 01/06/16 1608 01/23/16 1425  TSH 0.034* 4.936* 3.459   A1C: Lab Results  Component Value Date   HGBA1C 5.2 12/01/2015   Lipid Panel:  Recent Labs  12/01/15 0644  CHOL 107  HDL 27*  LDLCALC 59  TRIG 107  CHOLHDL 4.0    Radiological Exams: Dg Chest 1 View  Result Date: 01/11/2016 CLINICAL DATA:  Right hip pain status post fall EXAM: CHEST 1 VIEW COMPARISON:  12/03/2015 FINDINGS: Lungs are hyperinflated with emphysematous changes. Probable mild fibrosis within the bilateral lung bases. No acute consolidation or effusion. Stable cardiomegaly. Atherosclerosis. No pneumothorax. Old right clavicle fracture. Multiple old right rib fractures. IMPRESSION: 1. Hyperinflation with emphysematous changes.  No acute infiltrate. 2. Stable cardiomegaly. Electronically Signed   By: Donavan Foil M.D.   On: 01/11/2016 22:09   Pelvis Portable  Result Date: 01/12/2016 CLINICAL DATA:  76 y/o  F; status post right hip hemiarthroplasty. EXAM: PORTABLE PELVIS 1-2 VIEWS COMPARISON:  None. FINDINGS: There is a right hip hemiarthroplasty. On the single frontal view there is no apparent hardware related complication and the femoral head component is well seated in the acetabulum. No acute fracture identified. Foley catheter. Postsurgical changes in soft tissues overlying the right hip. IMPRESSION: Right hip  hemiarthroplasty with expected postsurgical changes and without apparent hardware related complication. Electronically Signed   By: [name] M.D.   On: 01/12/2016 16:18   Dg Chest Port 1 View  Result Date: 01/12/2016 CLINICAL DATA:  Initial evaluation for acute hypoxia. History of right hip surgery today. History of COPD. EXAM: PORTABLE CHEST 1 VIEW COMPARISON:  Prior radiograph from 01/11/2016. FINDINGS: Cardiomegaly is stable from prior. Mediastinal silhouette within normal limits. Aortic atherosclerosis noted. Lungs are hyperinflated with underlying changes compatible with COPD. There is diffusely increased interstitial prominence with a few scattered Kerley B-lines, suggesting pulmonary interstitial edema. A small left pleural effusion is present. No focal infiltrates identified. No pneumothorax. No acute osseous abnormality.  Osteopenia noted. IMPRESSION: 1. Stable cardiomegaly with findings suggestive of mild diffuse pulmonary interstitial edema. 2. Small left pleural effusion. 3. Underlying COPD. 4. Aortic atherosclerosis. Electronically Signed   By: Jeannine Boga M.D.   On: 01/12/2016 22:26   Dg Hip Unilat W Or Wo Pelvis 2-3 Views Right  Result Date: 01/11/2016 CLINICAL DATA:  [place] [address] at home while walking from continue bedroom. EXAM: DG HIP (WITH OR WITHOUT PELVIS) 2-3V RIGHT COMPARISON:  None FINDINGS: There is an acute subcapital right hip fracture with mild varus angulation. No dislocation. No radiographic findings to suggest a pathologic basis for the fracture. IMPRESSION: Subcapital right hip fracture Electronically Signed   By: Andreas Newport M.D.   On: 01/11/2016 22:08    Assessment/Plan 1. SVT (supraventricular tachycardia) (HCC) Pt was started on flecainide and metoprolol during last hospitalization due to SVT and tachycardia, recommended out pt Myoview to follow up, will get cardiology consult for further evaluation. Pts blood pressure is low after metoprolol,  will DC at this time. conts on digoxin daily, will follow up dig level.    2. Hypotension due to drugs Will stop metoprolol due to hypotension, pt also with frequent falls which hypotension could contribute.   3. Chronic obstructive pulmonary disease, unspecified COPD type ([place]) With O2 since hospitalization, now with cough, will get chest xray to evaluate.  Also to start mucinex DM PO BID for 7 days  4. Hyperthyroidism TSH stable, will cont on tapazole 5 mg daily  5. CKD (chronic kidney disease) stage 3, GFR 30-59 ml/min Will follow up bmp  6. Iron deficiency anemia, unspecified iron deficiency anemia type Will follow up [place]. Harle Battiest  [facility] [phone] 8  am - 5 pm) [phone] (after hours)

## 2016-03-02 DIAGNOSIS — S72011D Unspecified intracapsular fracture of right femur, subsequent encounter for closed fracture with routine healing: Secondary | ICD-10-CM | POA: Diagnosis not present

## 2016-03-02 DIAGNOSIS — J449 Chronic obstructive pulmonary disease, unspecified: Secondary | ICD-10-CM | POA: Diagnosis not present

## 2016-03-02 DIAGNOSIS — R05 Cough: Secondary | ICD-10-CM | POA: Diagnosis not present

## 2016-03-02 DIAGNOSIS — M6281 Muscle weakness (generalized): Secondary | ICD-10-CM | POA: Diagnosis not present

## 2016-03-02 DIAGNOSIS — D509 Iron deficiency anemia, unspecified: Secondary | ICD-10-CM | POA: Diagnosis not present

## 2016-03-02 LAB — BASIC METABOLIC PANEL
BUN: 18 mg/dL (ref 4–21)
Creatinine: 1.1 mg/dL (ref 0.5–1.1)
GLUCOSE: 85 mg/dL
POTASSIUM: 4.5 mmol/L (ref 3.4–5.3)
SODIUM: 140 mmol/L (ref 137–147)

## 2016-03-02 LAB — CBC AND DIFFERENTIAL
HCT: 31 % — AB (ref 36–46)
Hemoglobin: 9.9 g/dL — AB (ref 12.0–16.0)
Platelets: 192 10*3/uL (ref 150–399)
WBC: 5.8 10^3/mL

## 2016-03-03 DIAGNOSIS — S72011D Unspecified intracapsular fracture of right femur, subsequent encounter for closed fracture with routine healing: Secondary | ICD-10-CM | POA: Diagnosis not present

## 2016-03-03 DIAGNOSIS — M6281 Muscle weakness (generalized): Secondary | ICD-10-CM | POA: Diagnosis not present

## 2016-03-04 DIAGNOSIS — S72011D Unspecified intracapsular fracture of right femur, subsequent encounter for closed fracture with routine healing: Secondary | ICD-10-CM | POA: Diagnosis not present

## 2016-03-04 DIAGNOSIS — M6281 Muscle weakness (generalized): Secondary | ICD-10-CM | POA: Diagnosis not present

## 2016-03-07 DIAGNOSIS — M6281 Muscle weakness (generalized): Secondary | ICD-10-CM | POA: Diagnosis not present

## 2016-03-07 DIAGNOSIS — S72011D Unspecified intracapsular fracture of right femur, subsequent encounter for closed fracture with routine healing: Secondary | ICD-10-CM | POA: Diagnosis not present

## 2016-03-08 DIAGNOSIS — S72011D Unspecified intracapsular fracture of right femur, subsequent encounter for closed fracture with routine healing: Secondary | ICD-10-CM | POA: Diagnosis not present

## 2016-03-08 DIAGNOSIS — M6281 Muscle weakness (generalized): Secondary | ICD-10-CM | POA: Diagnosis not present

## 2016-03-10 DIAGNOSIS — Z5181 Encounter for therapeutic drug level monitoring: Secondary | ICD-10-CM | POA: Diagnosis not present

## 2016-03-10 DIAGNOSIS — Z79899 Other long term (current) drug therapy: Secondary | ICD-10-CM | POA: Diagnosis not present

## 2016-03-11 DIAGNOSIS — S72011D Unspecified intracapsular fracture of right femur, subsequent encounter for closed fracture with routine healing: Secondary | ICD-10-CM | POA: Diagnosis not present

## 2016-03-11 DIAGNOSIS — M6281 Muscle weakness (generalized): Secondary | ICD-10-CM | POA: Diagnosis not present

## 2016-03-12 DIAGNOSIS — S72011D Unspecified intracapsular fracture of right femur, subsequent encounter for closed fracture with routine healing: Secondary | ICD-10-CM | POA: Diagnosis not present

## 2016-03-12 DIAGNOSIS — M6281 Muscle weakness (generalized): Secondary | ICD-10-CM | POA: Diagnosis not present

## 2016-03-14 DIAGNOSIS — S72011D Unspecified intracapsular fracture of right femur, subsequent encounter for closed fracture with routine healing: Secondary | ICD-10-CM | POA: Diagnosis not present

## 2016-03-14 DIAGNOSIS — M6281 Muscle weakness (generalized): Secondary | ICD-10-CM | POA: Diagnosis not present

## 2016-03-15 ENCOUNTER — Encounter: Payer: Self-pay | Admitting: Internal Medicine

## 2016-03-15 ENCOUNTER — Encounter: Payer: Self-pay | Admitting: Cardiology

## 2016-03-15 ENCOUNTER — Non-Acute Institutional Stay (SKILLED_NURSING_FACILITY): Payer: Medicare Other | Admitting: Internal Medicine

## 2016-03-15 ENCOUNTER — Ambulatory Visit (INDEPENDENT_AMBULATORY_CARE_PROVIDER_SITE_OTHER): Payer: Medicare Other | Admitting: Cardiology

## 2016-03-15 ENCOUNTER — Encounter (INDEPENDENT_AMBULATORY_CARE_PROVIDER_SITE_OTHER): Payer: Self-pay

## 2016-03-15 ENCOUNTER — Telehealth: Payer: Self-pay | Admitting: Cardiology

## 2016-03-15 VITALS — BP 82/60 | HR 52 | Ht 68.0 in | Wt 132.0 lb

## 2016-03-15 DIAGNOSIS — Z79899 Other long term (current) drug therapy: Secondary | ICD-10-CM

## 2016-03-15 DIAGNOSIS — S72011D Unspecified intracapsular fracture of right femur, subsequent encounter for closed fracture with routine healing: Secondary | ICD-10-CM | POA: Diagnosis not present

## 2016-03-15 DIAGNOSIS — M6281 Muscle weakness (generalized): Secondary | ICD-10-CM | POA: Diagnosis not present

## 2016-03-15 DIAGNOSIS — I959 Hypotension, unspecified: Secondary | ICD-10-CM

## 2016-03-15 DIAGNOSIS — I471 Supraventricular tachycardia: Secondary | ICD-10-CM

## 2016-03-15 NOTE — Addendum Note (Signed)
Addended by: Stanton Kidney on: 03/15/2016 11:06 AM   Modules accepted: Orders

## 2016-03-15 NOTE — Patient Instructions (Addendum)
Medication Instructions:    Your physician has recommended you make the following change in your medication:  1) STOP Lasix  --- If you need a refill on your cardiac medications before your next appointment, please call your pharmacy. ---  Labwork:  None ordered  Testing/Procedures: Your physician has requested that you have a lexiscan myoview secondary to Flecainide start. For further information please visit [email or web address]. Please follow instruction sheet, as given.  Follow-Up:  Your physician recommends that you schedule a follow-up appointment in: 3 months with Dr. Curt Bears.  Thank you for choosing [facility]!!   Trinidad Curet, RN [phone]   Any Other Special Instructions Will Be Listed Below (If Applicable).  Pharmacologic Stress Electrocardiogram Introduction A pharmacologic stress electrocardiogram is a heart (cardiac) test that uses nuclear imaging to evaluate the blood supply to your heart. This test may also be called a pharmacologic stress electrocardiography. Pharmacologic means that a medicine is used to increase your heart rate and blood pressure. This stress test is done to find areas of poor blood flow to the heart by determining the extent of coronary artery disease (CAD). Some people exercise on a treadmill, which naturally increases the blood flow to the heart. For those people unable to exercise on a treadmill, a medicine is used. This medicine stimulates your heart and will cause your heart to beat harder and more quickly, as if you were exercising. Pharmacologic stress tests can help determine:  The adequacy of blood flow to your heart during increased levels of activity in order to clear you for discharge home.  The extent of coronary artery blockage caused by CAD.  Your prognosis if you have suffered a heart attack.  The effectiveness of cardiac procedures done, such as an angioplasty, which can increase the circulation in your coronary  arteries.  Causes of chest pain or pressure. LET [facility] CARE PROVIDER KNOW ABOUT:  Any allergies you have.  All medicines you are taking, including vitamins, herbs, eye drops, creams, and over-the-counter medicines.  Previous problems you or members of your family have had with the use of anesthetics.  Any blood disorders you have.  Previous surgeries you have had.  Medical conditions you have.  Possibility of pregnancy, if this applies.  If you are currently breastfeeding. RISKS AND COMPLICATIONS Generally, this is a safe procedure. However, as with any procedure, complications can occur. Possible complications include:  You develop pain or pressure in the following areas:  Chest.  Jaw or neck.  Between your shoulder blades.  Radiating down your left arm.  Headache.  Dizziness or light-headedness.  Shortness of breath.  Increased or irregular heartbeat.  Low blood pressure.  Nausea or vomiting.  Flushing.  Redness going up the arm and slight pain during injection of medicine.  Heart attack (rare). BEFORE THE PROCEDURE  Avoid all forms of caffeine for 24 hours before your test or as directed by your health care provider. This includes coffee, tea (even decaffeinated tea), caffeinated sodas, chocolate, cocoa, and certain pain medicines.  Follow your health care provider's instructions regarding eating and drinking before the test.  Take your medicines as directed at regular times with water unless instructed otherwise. Exceptions may include:  If you have diabetes, ask how you are to take your insulin or pills. It is common to adjust insulin dosing the morning of the test.  If you are taking beta-blocker medicines, it is important to talk to your health care provider about these medicines well before the date  of your test. Taking beta-blocker medicines may interfere with the test. In some cases, these medicines need to be changed or stopped 24 hours or  more before the test.  If you wear a nitroglycerin patch, it may need to be removed prior to the test. Ask your health care provider if the patch should be removed before the test.  If you use an inhaler for any breathing condition, bring it with you to the test.  If you are an outpatient, bring a snack so you can eat right after the stress phase of the test.  Do not smoke for 4 hours prior to the test or as directed by your health care provider.  Do not apply lotions, powders, creams, or oils on your chest prior to the test.  Wear comfortable shoes and clothing. Let your health care provider know if you were unable to complete or follow the preparations for your test. PROCEDURE  Multiple patches (electrodes) will be put on your chest. If needed, small areas of your chest may be shaved to get better contact with the electrodes. Once the electrodes are attached to your body, multiple wires will be attached to the electrodes, and your heart rate will be monitored.  An IV access will be started. A nuclear trace (isotope) is given. The isotope may be given intravenously, or it may be swallowed. Nuclear refers to several types of radioactive isotopes, and the nuclear isotope lights up the arteries so that the nuclear images are clear. The isotope is absorbed by your body. This results in low radiation exposure.  A resting nuclear image is taken to show how your heart functions at rest.  A medicine is given through the IV access.  A second scan is done about 1 hour after the medicine injection and determines how your heart functions under stress.  During this stress phase, you will be connected to an electrocardiogram machine. Your blood pressure and oxygen levels will be monitored. What to expect after the procedure  Your heart rate and blood pressure will be monitored after the test.  You may return to your normal schedule, including diet,activities, and medicines, unless your health care  provider tells you otherwise. This information is not intended to replace advice given to you by your health care provider. Make sure you discuss any questions you have with your health care provider. Document Released: 05/15/2008 Document Revised: 06/04/2015 Document Reviewed: 07/06/2015 Elsevier Interactive Patient Education  2017 [employer].

## 2016-03-15 NOTE — Telephone Encounter (Signed)
New message      Pt has an appt this am.  Please call daughter and give her an update on patient's visit for today

## 2016-03-15 NOTE — Assessment & Plan Note (Signed)
Recheck CBC. 

## 2016-03-15 NOTE — Progress Notes (Signed)
Electrophysiology Office Note   Date:  03/15/2016   ID:  Dawn [name], DOB [birth year], MRN [id number]  PCP:  [name], MD Primary Electrophysiologist:  Constance Haw, MD    Chief Complaint  Patient presents with  . Advice Only    SVT     History of Present Illness: Dawn [name] is a 77 y.o. female who presents today for electrophysiology evaluation.   Hx COPD, anemia, HTN, Hyperthyroidism, CKD stage III andhip surgery who was admitted to the hospital in January 2018 from [place] where she isrecovering after her hipsurgery. Patient fell when trying to get up off the toilet and hit the back of her head against the toilet.   She had an episode of SVT when admitted for sepsis in November 2017. 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 She returns today with more runs of SVT with rates in the 150s.  She was started on metoprolol and flecainide at that time.Her metoprolol was recently stopped due to low BP.  Today, she denies symptoms of palpitations, chest pain, shortness of breath, orthopnea, PND, lower extremity edema, claudication, dizziness, presyncope, syncope, bleeding, or neurologic sequela. The patient is tolerating medications without difficulties and is otherwise without complaint today.    Past Medical History:  Diagnosis Date  . COPD (chronic obstructive pulmonary disease) ([place])   . History of anxiety   . History of depression   . Spinal stenosis   . UTI (urinary tract infection)    Past Surgical History:  Procedure Laterality Date  . FLEXIBLE SIGMOIDOSCOPY Left 11/18/2015   Procedure: FLEXIBLE SIGMOIDOSCOPY;  Surgeon: [name], MD;  Location: [facility];  Service: Endoscopy;  Laterality: Left;  . FLEXIBLE SIGMOIDOSCOPY N/A 11/20/2015   Procedure: FLEXIBLE SIGMOIDOSCOPY;  Surgeon: [name], MD;  [facility] ENDOSCOPY;  Service: Endoscopy;  Laterality: N/A;  . HIP ARTHROPLASTY Right 01/12/2016   Procedure: ARTHROPLASTY BIPOLAR HIP (HEMIARTHROPLASTY);  Surgeon: [name], MD;  Location: [facility];  Service: Orthopedics;  Laterality: Right;     Current Outpatient Prescriptions  Medication Sig Dispense Refill  . ALPRAZolam (XANAX) 0.5 MG tablet Take 1 tablet (0.5 mg total) by mouth 2 (two) times daily as needed for anxiety. 60 tablet 0  . bisacodyl (DULCOLAX) 10 MG suppository Place 10 mg rectally as needed for moderate constipation.    . digoxin (LANOXIN) 0.125 MG tablet Take 1 tablet (0.125 mg total) by mouth daily. 30 tablet 0  . docusate sodium (COLACE) 100 MG capsule Take 1 capsule (100 mg total) by mouth 2 (two) times daily as needed for mild constipation. 10 capsule 0  . ferrous sulfate 325 (65 FE) MG tablet Take 325 mg by mouth 2 (two) times daily after a meal.    . flecainide (TAMBOCOR) 50 MG tablet Take 50 mg by mouth 2 (two) times daily.    Marland Kitchen FLUoxetine (PROZAC) 10 MG tablet Take 10 mg by mouth daily.    . furosemide (LASIX) 20 MG tablet Take 20 mg by mouth daily.    Marland Kitchen gabapentin (NEURONTIN) 600 MG tablet Take 600 mg by mouth 3 (three) times daily.    Marland Kitchen HYDROcodone-acetaminophen (NORCO/VICODIN) 5-325 MG tablet Take 1 tablet by mouth every 8 (eight) hours as needed for moderate pain.    . INCRUSE ELLIPTA 62.5 MCG/INH AEPB Inhale 1 puff into the lungs daily.  3  . levalbuterol (XOPENEX) 0.63 MG/3ML nebulizer solution Take 0.63 mg by nebulization 2 (two) times daily.    Marland Kitchen  levalbuterol (XOPENEX) 1.25 MG/3ML nebulizer solution Take 1.25 mg by nebulization every 6 (six) hours as needed for wheezing.    . magnesium hydroxide (MILK OF MAGNESIA) 400 MG/5ML suspension Take 30 mLs by mouth daily as needed for mild constipation.    . methimazole (TAPAZOLE) 5 MG tablet Take 5 mg by mouth daily.    . metoprolol tartrate (LOPRESSOR) 25 MG tablet Take 0.5 tablets (12.5 mg total) by mouth 2 (two) times daily. 60 tablet 0  . pantoprazole (PROTONIX) 40 MG tablet Take 40 mg by mouth daily.    .  Sodium Phosphates (RA SALINE ENEMA) 19-7 GM/118ML ENEM Place 1 each rectally as needed (for constipation).    . traZODone (DESYREL) 50 MG tablet Take 50 mg by mouth at bedtime.    Marland Kitchen UNABLE TO FIND Med Name: Med Pass 120 mL twice daily     No current facility-administered medications for this visit.     Allergies:   Patient has no known allergies.   Social History:  The patient  reports that she has quit smoking. She has never used smokeless tobacco. She reports that she does not drink alcohol or use drugs.   Family History:  The patient's family history includes Diabetes in her father; Heart attack in her mother.    ROS:  Please see the history of present illness.   Otherwise, review of systems is positive for none.   All other systems are reviewed and negative.    PHYSICAL EXAM: VS:  BP (!) 82/60   Pulse (!) 52   Ht '5\' 8"'$  (1.727 m)   Wt 132 lb (59.9 kg)   BMI 20.07 kg/m  , BMI Body mass index is 20.07 kg/m. GEN: Well nourished, well developed, in no acute distress  HEENT: normal  Neck: no JVD, carotid bruits, or masses Cardiac: RRR; no murmurs, rubs, or gallops,no edema  Respiratory:  clear to auscultation bilaterally, normal work of breathing GI: soft, nontender, nondistended, + BS MS: no deformity or atrophy  Skin: warm and dry Neuro:  Strength and sensation are intact Psych: euthymic mood, full affect  EKG:  EKG is ordered today. Personal review of the ekg ordered shows sinus rhythm, LAE, anterior TWI, rate 52  Recent Labs: 01/23/2016: ALT 10; TSH 3.459 01/24/2016: Magnesium 2.0 01/28/2016: BUN 18; Creatinine, Ser 0.96; Hemoglobin 9.2; Platelets 227; Potassium 4.3; Sodium 138    Lipid Panel     Component Value Date/Time   CHOL 107 12/01/2015 0644   TRIG 107 12/01/2015 0644   HDL 27 (L) 12/01/2015 0644   CHOLHDL 4.0 12/01/2015 0644   VLDL 21 12/01/2015 0644   LDLCALC 59 12/01/2015 0644     Wt Readings from Last 3 Encounters:  03/15/16 132 lb (59.9 kg)    03/15/16 122 lb (55.3 kg)  03/01/16 122 lb (55.3 kg)      Other studies Reviewed: Additional studies/ records that were reviewed today include: TTE 01/14/16  Review of the above records today demonstrates:  - Left ventricle: The cavity size was normal. Wall thickness was   increased in a pattern of moderate LVH. Systolic function was   normal. The estimated ejection fraction was in the range of 50%   to 55%. Doppler parameters are consistent with abnormal left   ventricular relaxation (grade 1 diastolic dysfunction). The E/e&'   ratio is <8, suggesting normal LV filling pressure. - Aortic valve: Mildly calcified leaflets. There was mild   regurgitation. - Left atrium: The atrium was mildly dilated. -  Right ventricle: The cavity size was mildly dilated. The   moderator band was prominent. Systolic function was normal. - Right atrium: The atrium was mildly dilated. - Tricuspid valve: There was moderate regurgitation. - Pulmonary arteries: PA peak pressure: 64 mm Hg (S). - Inferior vena cava: The vessel was dilated. The respirophasic   diameter changes were blunted (< 50%), consistent with elevated   central venous pressure.  Telemetry 12/31/15  Basic rhythm is NSR  Recurrent asymptomatic WCT, presumed SVT with aberration vs VT(less likely).  No excessive bradycardia.   Abnormal monitor. Recurrent asymptomatic WCT, likely SVT.  Clinical correlation needed.  ASSESSMENT AND PLAN:  1.  SVT: He is feeling well currently not having any palpitations prior to her monitor, she was having episodes of fluttering in her chest. It appears that the flecainide is helping to control her episodes of SVT. We'll continue her flecainide at the current dose. If her blood pressure improves, she would benefit from increasing her dose of metoprolol.  2.  Hypertension: Currently on the lowest dose of metoprolol. We'll stop her Lasix today. Should continue to be monitored by her nursing  facility.    Current medicines are reviewed at length with the patient today.   The patient does not have concerns regarding her medicines.  The following changes were made today:  Stop lasix  Labs/ tests ordered today include:  Orders Placed This Encounter  Procedures  . Myocardial Perfusion Imaging  . EKG 12-Lead     Disposition:   FU with Roniyah Llorens 3 months  Signed, Kevyn Wengert Meredith Leeds, MD  03/15/2016 10:47 AM     [facility] [address] [place] [address] [phone] (office) [phone] (fax)

## 2016-03-15 NOTE — Telephone Encounter (Signed)
Informed dtr of today's office visit and findings/recommendations.  Dtr verbalized understanding and thanks me for calling

## 2016-03-15 NOTE — Assessment & Plan Note (Addendum)
As per [facility], Cardiology (see his note 03/15/16) Dig level 0.8 on 03/10/16

## 2016-03-15 NOTE — Progress Notes (Signed)
    Facility Location: [facility]  Room Number:301-B  Code Status: Full Code    This is a nursing facility follow up for specific acute issue of hypotension and bradycardia.  Interim medical record and care since last [place] visit was updated with review of diagnostic studies and change in clinical status since last visit were documented.  HPI: She does describe being slightly weak but denies any active cardiopulmonary symptoms. She sees  Dr. Curt Bears, Cardiology/Electrophysiology who had prescribed flecainide, metoprolol, and digitalis for supraventricular tachycardia. He saw the patient today ;the vital signs are recorded as blood pressure 82/60 and pulse 52. That note was reviewed; it states that metoprolol was stopped due to hypotension. Actually she is on 12.5 mg twice a day, the plan was to increase it if her blood pressure improves. Furosemide was discontinued by cardiology. Her dig level was 0.8 on 03/10/16. She has had a normochromic, normocytic anemia. HCT was 30.5 on 1/18. Repeat was ordered 2/20, those results have been requested. I had seen the patient on 02/11/16 and begun decreasing the opioid dose because of recurrent falls. Medicines on the [place] list were to be weaned. The necessity for this was discussed with her at that time.  Review of systems: She states that she is having intermittent occipital headache. Her PCP has not evaluated this yet. Intermittent tremor of the left hand has been present as well. Again there's been no formal evaluation. She describes some weakness.  Constitutional: No fever,significant weight change Cardiovascular: No chest pain, palpitations,paroxysmal nocturnal dyspnea, claudication, edema  Respiratory: No cough, sputum production,hemoptysis, DOE , significant snoring,apnea  Gastrointestinal: No heartburn,dysphagia,abdominal pain, nausea / vomiting,rectal bleeding, melena,change in bowels Dermatologic: No  rash, pruritus, change in appearance of skin Neurologic: No dizziness,syncope, seizures, numbness , tingling Psychiatric: No significant anxiety , depression, insomnia, anorexia Endocrine: No change in hair/skin/ nails, excessive thirst, excessive hunger, excessive urination  Hematologic/lymphatic: No significant bruising, lymphadenopathy,abnormal bleeding Allergy/immunology: No itchy/ watery eyes, significant sneezing, urticaria, angioedema  Physical exam:  Pertinent or positive findings: She is oriented, affect is flat. She is thin. The second heart sound is accentuated. She has minor rales anteriorly. There is slight tremor of the left hand. She has interosseous wasting of the hands. Pedal pulses are decreased.  General appearance:Adequately nourished; no acute distress , increased work of breathing is present.   Lymphatic: No lymphadenopathy about the head, neck, axilla . Eyes: No conjunctival inflammation or lid edema is present. There is no scleral icterus. Ears:  External ear exam shows no significant lesions or deformities.   Nose:  External nasal examination shows no deformity or inflammation. Nasal mucosa are pink and moist without lesions ,exudates Oral exam: lips and gums are healthy appearing.There is no oropharyngeal erythema or exudate . Neck:  No thyromegaly, masses, tenderness noted.    Heart:  Normal rate and regular rhythm. S1 and S2 normal without gallop, murmur, click, rub .  Lungs:Chest clear to auscultation without wheezes, rhonchi,rales , rubs. Abdomen:Bowel sounds are normal. Abdomen is soft and nontender with no organomegaly, hernias,masses. GU: deferred  Extremities:  No cyanosis, clubbing,edema  Neurologic exam : Strength equal  in upper & lower extremities but decreased Balance,Rhomberg,finger to nose testing could not be completed due to clinical state Deep tendon reflexes are equal Skin: Warm & dry w/o tenting. No significant lesions or rash.  See summary  under each active problem in the Problem List with associated updated therapeutic plan

## 2016-03-15 NOTE — Patient Instructions (Signed)
See assessment and plan under each diagnosis in the problem list and acutely for this visit 

## 2016-03-16 DIAGNOSIS — M6281 Muscle weakness (generalized): Secondary | ICD-10-CM | POA: Diagnosis not present

## 2016-03-16 DIAGNOSIS — S72011D Unspecified intracapsular fracture of right femur, subsequent encounter for closed fracture with routine healing: Secondary | ICD-10-CM | POA: Diagnosis not present

## 2016-03-17 DIAGNOSIS — M6281 Muscle weakness (generalized): Secondary | ICD-10-CM | POA: Diagnosis not present

## 2016-03-17 DIAGNOSIS — S72011D Unspecified intracapsular fracture of right femur, subsequent encounter for closed fracture with routine healing: Secondary | ICD-10-CM | POA: Diagnosis not present

## 2016-03-18 DIAGNOSIS — S72011D Unspecified intracapsular fracture of right femur, subsequent encounter for closed fracture with routine healing: Secondary | ICD-10-CM | POA: Diagnosis not present

## 2016-03-18 DIAGNOSIS — M6281 Muscle weakness (generalized): Secondary | ICD-10-CM | POA: Diagnosis not present

## 2016-03-21 DIAGNOSIS — S72011D Unspecified intracapsular fracture of right femur, subsequent encounter for closed fracture with routine healing: Secondary | ICD-10-CM | POA: Diagnosis not present

## 2016-03-21 DIAGNOSIS — M6281 Muscle weakness (generalized): Secondary | ICD-10-CM | POA: Diagnosis not present

## 2016-03-23 DIAGNOSIS — Z471 Aftercare following joint replacement surgery: Secondary | ICD-10-CM | POA: Diagnosis not present

## 2016-03-23 DIAGNOSIS — Z96641 Presence of right artificial hip joint: Secondary | ICD-10-CM | POA: Diagnosis not present

## 2016-03-23 DIAGNOSIS — M7061 Trochanteric bursitis, right hip: Secondary | ICD-10-CM | POA: Diagnosis not present

## 2016-03-23 DIAGNOSIS — M25551 Pain in right hip: Secondary | ICD-10-CM | POA: Diagnosis not present

## 2016-03-30 ENCOUNTER — Encounter (HOSPITAL_COMMUNITY)
Admission: RE | Admit: 2016-03-30 | Discharge: 2016-03-30 | Disposition: A | Discharge status: Home / Self Care | Payer: Medicare Other | Source: Ambulatory Visit | Attending: Cardiology | Admitting: Cardiology

## 2016-03-30 DIAGNOSIS — Z79899 Other long term (current) drug therapy: Secondary | ICD-10-CM | POA: Insufficient documentation

## 2016-03-30 DIAGNOSIS — I471 Supraventricular tachycardia: Secondary | ICD-10-CM | POA: Insufficient documentation

## 2016-03-30 LAB — NM MYOCAR MULTI W/SPECT W/WALL MOTION / EF
CHL CUP NUCLEAR SDS: 0
CHL CUP RESTING HR STRESS: 44 {beats}/min
CSEPED: 5 min
CSEPEW: 1 METS
Exercise duration (sec): 2 s
LHR: 0.35
LV sys vol: 39 mL
LVDIAVOL: 99 mL (ref 46–106)
Peak HR: 62 {beats}/min
SRS: 1
SSS: 1
TID: 1.01

## 2016-03-30 MED ORDER — TECHNETIUM TC 99M TETROFOSMIN IV KIT
10.0000 | PACK | Freq: Once | INTRAVENOUS | Status: AC | PRN
  Administered 2016-03-30: 10 via INTRAVENOUS

## 2016-03-30 MED ORDER — REGADENOSON 0.4 MG/5ML IV SOLN
0.4000 mg | Freq: Once | INTRAVENOUS | Status: AC
Start: 2016-03-30 — End: 2016-03-30
  Administered 2016-03-30: 0.4 mg via INTRAVENOUS

## 2016-03-30 MED ORDER — REGADENOSON 0.4 MG/5ML IV SOLN
INTRAVENOUS | Status: AC
  Filled 2016-03-30: qty 5

## 2016-03-30 MED ORDER — TECHNETIUM TC 99M TETROFOSMIN IV KIT
30.0000 | PACK | Freq: Once | INTRAVENOUS | Status: AC | PRN
  Administered 2016-03-30: 30 via INTRAVENOUS

## 2016-03-30 NOTE — Progress Notes (Signed)
Patient presented for outpatient Lexiscan. Tolerated procedure well. Pending final stress imaging result.

## 2016-03-31 DIAGNOSIS — M6281 Muscle weakness (generalized): Secondary | ICD-10-CM | POA: Diagnosis not present

## 2016-03-31 DIAGNOSIS — S72011D Unspecified intracapsular fracture of right femur, subsequent encounter for closed fracture with routine healing: Secondary | ICD-10-CM | POA: Diagnosis not present

## 2016-04-01 DIAGNOSIS — S72011D Unspecified intracapsular fracture of right femur, subsequent encounter for closed fracture with routine healing: Secondary | ICD-10-CM | POA: Diagnosis not present

## 2016-04-01 DIAGNOSIS — M6281 Muscle weakness (generalized): Secondary | ICD-10-CM | POA: Diagnosis not present

## 2016-04-04 DIAGNOSIS — M6281 Muscle weakness (generalized): Secondary | ICD-10-CM | POA: Diagnosis not present

## 2016-04-04 DIAGNOSIS — S72011D Unspecified intracapsular fracture of right femur, subsequent encounter for closed fracture with routine healing: Secondary | ICD-10-CM | POA: Diagnosis not present

## 2016-04-05 DIAGNOSIS — M6281 Muscle weakness (generalized): Secondary | ICD-10-CM | POA: Diagnosis not present

## 2016-04-05 DIAGNOSIS — S72011D Unspecified intracapsular fracture of right femur, subsequent encounter for closed fracture with routine healing: Secondary | ICD-10-CM | POA: Diagnosis not present

## 2016-04-05 LAB — CBC AND DIFFERENTIAL
HCT: 28 — AB (ref 36–46)
HEMOGLOBIN: 9.6 — AB (ref 12.0–16.0)
Neutrophils Absolute: 3
PLATELETS: 182 (ref 150–399)
WBC: 5.6

## 2016-04-05 LAB — TSH: TSH: 4.65 (ref 0.41–5.90)

## 2016-04-05 LAB — BASIC METABOLIC PANEL
BUN: 16 (ref 4–21)
CREATININE: 1.3 — AB (ref 0.5–1.1)
GLUCOSE: 88
POTASSIUM: 4.5 (ref 3.4–5.3)
Sodium: 142 (ref 137–147)

## 2016-04-06 ENCOUNTER — Encounter: Payer: Self-pay | Admitting: Nurse Practitioner

## 2016-04-06 ENCOUNTER — Non-Acute Institutional Stay (SKILLED_NURSING_FACILITY): Payer: Medicare Other | Admitting: Nurse Practitioner

## 2016-04-06 DIAGNOSIS — M6281 Muscle weakness (generalized): Secondary | ICD-10-CM | POA: Diagnosis not present

## 2016-04-06 DIAGNOSIS — J449 Chronic obstructive pulmonary disease, unspecified: Secondary | ICD-10-CM | POA: Diagnosis not present

## 2016-04-06 DIAGNOSIS — E0590 Thyrotoxicosis, unspecified without thyrotoxic crisis or storm: Secondary | ICD-10-CM | POA: Diagnosis not present

## 2016-04-06 DIAGNOSIS — R001 Bradycardia, unspecified: Secondary | ICD-10-CM | POA: Diagnosis not present

## 2016-04-06 DIAGNOSIS — F339 Major depressive disorder, recurrent, unspecified: Secondary | ICD-10-CM | POA: Diagnosis not present

## 2016-04-06 DIAGNOSIS — D509 Iron deficiency anemia, unspecified: Secondary | ICD-10-CM | POA: Diagnosis not present

## 2016-04-06 DIAGNOSIS — I959 Hypotension, unspecified: Secondary | ICD-10-CM

## 2016-04-06 DIAGNOSIS — S72011D Unspecified intracapsular fracture of right femur, subsequent encounter for closed fracture with routine healing: Secondary | ICD-10-CM | POA: Diagnosis not present

## 2016-04-06 NOTE — Progress Notes (Signed)
Nursing Home Location:  [facility] Room number: [place] of Service: SNF (31)  PCP: [name], MD  No Known Allergies  Chief Complaint  Patient presents with  . Medical Management of Chronic Issues    Resident is being seen for a routine visit.     HPI:  Patient is a 76 y.o. female seen today at [facility] for routine follow up. Pt has been at [facility] for rehab since 01/15/16 after fall with subcapital fracture of right hip s/p right hip hemiarthroplasty on 01/12/16. Pt with hx of hyperthyroidism, COPD, anemia, depression. Nursing concerns over multiple episodes of bradycardia and hypotension. Pt following with cardiology.  Blood pressure medication has been frequently held per nursing.  Pt reports increase depression. Stays in room most of the time.    Review of Systems:  Review of Systems  Constitutional: Positive for activity change. Negative for appetite change, chills, fatigue, fever and unexpected weight change.  HENT: Negative for tinnitus.   Eyes: Negative.   Respiratory: Negative for cough, chest tightness, shortness of breath and wheezing.   Cardiovascular: Positive for palpitations and leg swelling (right). Negative for chest pain.  Gastrointestinal: Negative for abdominal pain, constipation and diarrhea.  Genitourinary: Negative for difficulty urinating, dysuria, frequency and urgency.  Musculoskeletal: Positive for arthralgias, back pain and myalgias.  Skin: Negative.  Negative for color change and wound.  Neurological: Negative for dizziness, weakness and headaches.  Psychiatric/Behavioral: Positive for dysphoric mood and sleep disturbance. Negative for agitation, behavioral problems and confusion. The patient is nervous/anxious.     Past Medical History:  Diagnosis Date  . COPD (chronic obstructive pulmonary disease) ([place])   . History of anxiety   . History of depression   . Spinal stenosis   . UTI (urinary tract infection)     Past Surgical History:  Procedure Laterality Date  . FLEXIBLE SIGMOIDOSCOPY Left 11/18/2015   Procedure: FLEXIBLE SIGMOIDOSCOPY;  Surgeon: [name], MD;  Location: [facility];  Service: Endoscopy;  Laterality: Left;  . FLEXIBLE SIGMOIDOSCOPY N/A 11/20/2015   Procedure: FLEXIBLE SIGMOIDOSCOPY;  Surgeon: [name], MD;  Location: [facility] ENDOSCOPY;  Service: Endoscopy;  Laterality: N/A;  . HIP ARTHROPLASTY Right 01/12/2016   Procedure: ARTHROPLASTY BIPOLAR HIP (HEMIARTHROPLASTY);  Surgeon: [name], MD;  Location: [facility];  Service: Orthopedics;  Laterality: Right;   Social History:   reports that she has quit smoking. She has never used smokeless tobacco. She reports that she does not drink alcohol or use drugs.  Family History  Problem Relation Age of Onset  . Heart attack Mother   . Diabetes Father   . Thyroid disease Neg Hx     Medications: Patient's Medications  New Prescriptions   No medications on file  Previous Medications   ALPRAZOLAM (XANAX) 0.25 MG TABLET    Take 0.25 mg by mouth at bedtime.   BISACODYL (DULCOLAX) 10 MG SUPPOSITORY    Place 10 mg rectally as needed for moderate constipation.   DIGOXIN (LANOXIN) 0.125 MG TABLET    Take 1 tablet (0.125 mg total) by mouth daily.   DOCUSATE SODIUM (COLACE) 100 MG CAPSULE    Take 1 capsule (100 mg total) by mouth 2 (two) times daily as needed for mild constipation.   FERROUS SULFATE 325 (65 FE) MG TABLET    Take 325 mg by mouth 2 (two) times daily after a meal.   FLECAINIDE (TAMBOCOR) 50 MG TABLET    Take 50 mg by mouth 2 (two)  times daily.   FLUOXETINE (PROZAC) 10 MG TABLET    Take 10 mg by mouth daily.   GABAPENTIN (NEURONTIN) 600 MG TABLET    Take 600 mg by mouth 3 (three) times daily.   HYDROCODONE-ACETAMINOPHEN (NORCO/VICODIN) 5-325 MG TABLET    Take 1 tablet by mouth every 12 (twelve) hours as needed for moderate pain.    INCRUSE ELLIPTA 62.5 MCG/INH AEPB    Inhale 1 puff into the lungs daily.   LEVALBUTEROL (XOPENEX)  0.63 MG/3ML NEBULIZER SOLUTION    Take 0.63 mg by nebulization 2 (two) times daily.   LEVALBUTEROL (XOPENEX) 1.25 MG/3ML NEBULIZER SOLUTION    Take 1.25 mg by nebulization every 6 (six) hours as needed for wheezing.   MAGNESIUM HYDROXIDE (MILK OF MAGNESIA) 400 MG/5ML SUSPENSION    Take 30 mLs by mouth daily as needed for mild constipation.   METHIMAZOLE (TAPAZOLE) 5 MG TABLET    Take 5 mg by mouth daily.   METOPROLOL TARTRATE (LOPRESSOR) 25 MG TABLET    Take 0.5 tablets (12.5 mg total) by mouth 2 (two) times daily.   PANTOPRAZOLE (PROTONIX) 40 MG TABLET    Take 40 mg by mouth daily.   SODIUM PHOSPHATES (RA SALINE ENEMA) 19-7 GM/118ML ENEM    Place 1 each rectally as needed (for constipation).   TRAZODONE (DESYREL) 50 MG TABLET    Take 50 mg by mouth at bedtime.   UNABLE TO FIND    Med Name: Med Pass 120 mL twice daily  Modified Medications   No medications on file  Discontinued Medications   ALPRAZOLAM (XANAX) 0.5 MG TABLET    Take 1 tablet (0.5 mg total) by mouth 2 (two) times daily as needed for anxiety.     Physical Exam: Vitals:   04/06/16 1300  BP: 96/60  Pulse: (!) 56  Resp: 20  Temp: 97.4 F (36.3 C)  SpO2: 96%  Weight: 126 lb 6.4 oz (57.3 kg)  Height: '5\' 8"'$  (1.727 m)    Physical Exam  Constitutional: She is oriented to person, place, and time. She appears well-developed. No distress.  Fail elderly female  HENT:  Head: Normocephalic and atraumatic.  Mouth/Throat: Oropharynx is clear and moist. No oropharyngeal exudate.  Eyes: Conjunctivae are normal. Pupils are equal, round, and reactive to light.  Neck: Normal range of motion. Neck supple.  Cardiovascular: Regular rhythm and normal heart sounds.  Bradycardia present.   Pulmonary/Chest: Effort normal and breath sounds normal.  Abdominal: Soft. Bowel sounds are normal.  Musculoskeletal: She exhibits no edema or tenderness.  Neurological: She is alert and oriented to person, place, and time.  Skin: Skin is warm and dry.  She is not diaphoretic.  Psychiatric: She has a normal mood and affect.    Labs reviewed: Basic Metabolic Panel:  Recent Labs  11/13/15 0129  11/14/15 0556  12/02/15 0459  01/23/16 0846 01/24/16 0343  01/26/16 1518 01/27/16 0424 01/28/16 0432 03/02/16  NA 139  < > 139  < > 137  137  < >  --  140  < > 136 139 138 140  K 3.6  < > 3.0*  < > 3.8  3.8  < >  --  3.6  < > 3.7 3.8 4.3 4.5  CL 106  --  109  < > 103  < >  --  101  < > 95* 93* 94*  --   CO2 25  --  24  < > 27  < >  --  29  < > 34* 36* 32  --   GLUCOSE 125*  --  86  < > 94  < >  --  86  < > 136* 92 79  --   BUN 13  < > 11  < > 8  8  < >  --  11  < > '17 17 18 18  '$ CREATININE 1.10*  < > 0.90  < > 0.96  1.0  < >  --  0.91  < > 0.98 1.03* 0.96 1.1  CALCIUM 7.3*  --  7.3*  < > 8.0*  < >  --  7.8*  < > 7.8* 8.3* 8.4*  --   MG 2.4  --  1.9  < > 1.8  --  1.8 2.0  --   --   --   --   --   PHOS 3.7  --  3.2  --   --   --  3.4  --   --   --   --   --   --   < > = values in this interval not displayed. Liver Function Tests:  Recent Labs  12/02/15 0459 12/10/15 1524 01/23/16 0846  AST 8* 8 12*  ALT 6* 6 10*  ALKPHOS 56 64 94  BILITOT 0.3 0.4 0.4  PROT 5.2* 6.8 4.7*  ALBUMIN 2.2* 3.1* 1.7*   No results for input(s): LIPASE, AMYLASE in the last 8760 hours.  Recent Labs  12/01/15 0047  AMMONIA 11   CBC:  Recent Labs  01/11/16 2145  01/23/16 0516  01/24/16 0343  01/25/16 0252  01/27/16 0424 01/27/16 1552 01/28/16 0432 03/02/16  WBC 10.8*  < > 9.0  --  12.8*  --  9.4  --  4.3  --  4.6 5.8  NEUTROABS 9.3*  --  7.0  --  11.4*  --   --   --   --   --   --   --   HGB 9.6*  < > 8.6*  < > 9.4*  < > 8.7*  < > 8.8*  8.9* 8.9* 9.2* 9.9*  HCT 30.5*  < > 28.6*  < > 30.2*  < > 28.4*  < > 29.0*  29.0* 29.4* 30.5* 31*  MCV 93.3  < > 99.3  --  95.6  --  95.0  --  96.7  --  96.8  --   PLT 223  < > 177  --  204  --  187  --  207  --  227 192  < > = values in this interval not displayed. TSH:  Recent Labs  12/01/15 1201  01/06/16 1608 01/23/16 1425  TSH 0.034* 4.936* 3.459   A1C: Lab Results  Component Value Date   HGBA1C 5.2 12/01/2015   Lipid Panel:  Recent Labs  12/01/15 0644  CHOL 107  HDL 27*  LDLCALC 59  TRIG 107  CHOLHDL 4.0     Digoxin (Lanoxin) 03/10/16: 0.80   Assessment/Plan 1. Hypotension, unspecified hypotension type Blood pressure low at times with lopressor being held, will decrease to 12.5 mg daily at this time due to hypotension and bradycardia and monitor.   2. Bradycardia pts HR raning from 40-50s in the evening with PM dose of lopressor being held after reading. Pt with hx of SVT and following with cardiology, No palpitations or noted elevation in HR, will decrease lopressor to 12.5 mg daily, conts on digoxin and flecainide, dig level 0.8 this month  3. Hyperthyroidism TSH in normal range in January; conts on tapazole.   4. Chronic obstructive pulmonary disease, unspecified COPD type ([place]) Requiring long term O2 at this time. conts on incruse daily with xopenex PRN  5. Iron deficiency anemia, unspecified iron deficiency anemia type hgb stable on most recent lab; conts iron supplement   6. Depression, recurrent ([place]) Currently on prozac, will dc and start zoloft 50 mg daily   Makynli Stills K. Harle Battiest  [facility] [phone] 8 am - 5 pm) [phone] (after hours)

## 2016-04-07 DIAGNOSIS — M6281 Muscle weakness (generalized): Secondary | ICD-10-CM | POA: Diagnosis not present

## 2016-04-07 DIAGNOSIS — S72011D Unspecified intracapsular fracture of right femur, subsequent encounter for closed fracture with routine healing: Secondary | ICD-10-CM | POA: Diagnosis not present

## 2016-04-08 DIAGNOSIS — M6281 Muscle weakness (generalized): Secondary | ICD-10-CM | POA: Diagnosis not present

## 2016-04-08 DIAGNOSIS — S72011D Unspecified intracapsular fracture of right femur, subsequent encounter for closed fracture with routine healing: Secondary | ICD-10-CM | POA: Diagnosis not present

## 2016-04-09 DIAGNOSIS — S72011D Unspecified intracapsular fracture of right femur, subsequent encounter for closed fracture with routine healing: Secondary | ICD-10-CM | POA: Diagnosis not present

## 2016-04-09 DIAGNOSIS — M6281 Muscle weakness (generalized): Secondary | ICD-10-CM | POA: Diagnosis not present

## 2016-04-11 DIAGNOSIS — M6281 Muscle weakness (generalized): Secondary | ICD-10-CM | POA: Diagnosis not present

## 2016-04-11 DIAGNOSIS — S72011D Unspecified intracapsular fracture of right femur, subsequent encounter for closed fracture with routine healing: Secondary | ICD-10-CM | POA: Diagnosis not present

## 2016-04-12 DIAGNOSIS — L129 Pemphigoid, unspecified: Secondary | ICD-10-CM | POA: Diagnosis not present

## 2016-04-12 DIAGNOSIS — D649 Anemia, unspecified: Secondary | ICD-10-CM | POA: Diagnosis not present

## 2016-04-12 DIAGNOSIS — E039 Hypothyroidism, unspecified: Secondary | ICD-10-CM | POA: Diagnosis not present

## 2016-04-12 DIAGNOSIS — S72011D Unspecified intracapsular fracture of right femur, subsequent encounter for closed fracture with routine healing: Secondary | ICD-10-CM | POA: Diagnosis not present

## 2016-04-12 DIAGNOSIS — M6281 Muscle weakness (generalized): Secondary | ICD-10-CM | POA: Diagnosis not present

## 2016-04-12 LAB — CBC AND DIFFERENTIAL
HCT: 32 % — AB (ref 36–46)
HEMOGLOBIN: 10.6 g/dL — AB (ref 12.0–16.0)
Platelets: 157 10*3/uL (ref 150–399)
WBC: 5.4 10*3/mL

## 2016-04-12 LAB — TSH: TSH: 8.01 u[IU]/mL — AB (ref 0.41–5.90)

## 2016-04-13 ENCOUNTER — Other Ambulatory Visit: Payer: Self-pay | Admitting: *Deleted

## 2016-04-13 ENCOUNTER — Telehealth: Payer: Self-pay | Admitting: Cardiology

## 2016-04-13 MED ORDER — HYDROCODONE-ACETAMINOPHEN 5-325 MG PO TABS: ORAL_TABLET | ORAL | 0 refills | Status: DC

## 2016-04-13 NOTE — Telephone Encounter (Signed)
Mrs. Blakeney is returning a call from last week . Thanks

## 2016-04-13 NOTE — Telephone Encounter (Signed)
I spoke with pt's daughter and reviewed stress test results with her.

## 2016-04-13 NOTE — Telephone Encounter (Signed)
[facility] [phone] Fax: [phone]  

## 2016-05-06 DIAGNOSIS — M25551 Pain in right hip: Secondary | ICD-10-CM | POA: Diagnosis not present

## 2016-05-06 DIAGNOSIS — Z96641 Presence of right artificial hip joint: Secondary | ICD-10-CM | POA: Diagnosis not present

## 2016-05-06 DIAGNOSIS — Z96649 Presence of unspecified artificial hip joint: Secondary | ICD-10-CM | POA: Diagnosis not present

## 2016-05-06 DIAGNOSIS — Z471 Aftercare following joint replacement surgery: Secondary | ICD-10-CM | POA: Diagnosis not present

## 2016-05-06 DIAGNOSIS — M7061 Trochanteric bursitis, right hip: Secondary | ICD-10-CM | POA: Diagnosis not present

## 2016-05-11 ENCOUNTER — Encounter: Payer: Self-pay | Admitting: Nurse Practitioner

## 2016-05-11 ENCOUNTER — Non-Acute Institutional Stay (SKILLED_NURSING_FACILITY): Payer: Medicare Other | Admitting: Nurse Practitioner

## 2016-05-11 DIAGNOSIS — R001 Bradycardia, unspecified: Secondary | ICD-10-CM | POA: Diagnosis not present

## 2016-05-11 DIAGNOSIS — E0590 Thyrotoxicosis, unspecified without thyrotoxic crisis or storm: Secondary | ICD-10-CM

## 2016-05-11 DIAGNOSIS — D509 Iron deficiency anemia, unspecified: Secondary | ICD-10-CM | POA: Diagnosis not present

## 2016-05-11 DIAGNOSIS — J449 Chronic obstructive pulmonary disease, unspecified: Secondary | ICD-10-CM | POA: Diagnosis not present

## 2016-05-11 MED ORDER — METHIMAZOLE 5 MG PO TABS: 2.5000 mg | ORAL_TABLET | Freq: Every day | ORAL | Status: DC

## 2016-05-11 MED ORDER — METOPROLOL TARTRATE 25 MG PO TABS: 12.5000 mg | ORAL_TABLET | Freq: Every day | ORAL | 0 refills | Status: DC

## 2016-05-11 NOTE — Progress Notes (Signed)
Patient ID: Dawn [name], female   DOB: 17-Oct-1940, 76 y.o.   MRN: [id number]      Nursing Home Location:  [facility] Room number: [place] of Service: SNF (31)  PCP: [name], MD  No Known Allergies  Chief Complaint  Patient presents with  . Medical Management of Chronic Issues    routine visit    HPI:  Patient is a 76 y.o. female seen today at [facility] for routine follow up. Pt with hx of hyperthyroidism, COPD, anemia, depression. Pt has been doing well in the last month. There has been no acute issues noted. Pt denies changes in shortness of breath, denies chest pains or palpitation. Denies worsening anxiety or depression at this time. Nursing without concerns at this time.   Review of Systems:  Review of Systems  Constitutional: Negative for activity change, appetite change, chills, fatigue, fever and unexpected weight change.  HENT: Negative for tinnitus.   Eyes: Negative.   Respiratory: Negative for cough, chest tightness, shortness of breath and wheezing.   Cardiovascular: Negative for chest pain, palpitations and leg swelling.  Gastrointestinal: Negative for abdominal pain, constipation and diarrhea.  Genitourinary: Negative for difficulty urinating, dysuria, frequency and urgency.  Musculoskeletal: Positive for arthralgias, back pain and myalgias.  Skin: Negative.  Negative for color change and wound.  Neurological: Negative for dizziness, weakness and headaches.  Psychiatric/Behavioral: Positive for dysphoric mood and sleep disturbance. Negative for agitation, behavioral problems and confusion. The patient is nervous/anxious.     Past Medical History:  Diagnosis Date  . COPD (chronic obstructive pulmonary disease) ([place])   . History of anxiety   . History of depression   . Spinal stenosis   . UTI (urinary tract infection)    Past Surgical History:  Procedure Laterality Date  . FLEXIBLE SIGMOIDOSCOPY Left 11/18/2015   Procedure: FLEXIBLE SIGMOIDOSCOPY;  Surgeon: [name], MD;  Location: [place];  Service: Endoscopy;  Laterality: Left;  . FLEXIBLE SIGMOIDOSCOPY N/A 11/20/2015   Procedure: FLEXIBLE SIGMOIDOSCOPY;  Surgeon: [name], MD;  Location: [facility] ENDOSCOPY;  Service: Endoscopy;  Laterality: N/A;  . HIP ARTHROPLASTY Right 01/12/2016   Procedure: ARTHROPLASTY BIPOLAR HIP (HEMIARTHROPLASTY);  Surgeon: [name], MD;  Location: [facility];  Service: Orthopedics;  Laterality: Right;   Social History:   reports that she has quit smoking. She has never used smokeless tobacco. She reports that she does not drink alcohol or use drugs.  Family History  Problem Relation Age of Onset  . Heart attack Mother   . Diabetes Father   . Thyroid disease Neg Hx     Medications: Patient's Medications  New Prescriptions   No medications on file  Previous Medications   ALPRAZOLAM (XANAX) 0.25 MG TABLET    Take 0.25 mg by mouth at bedtime.   BISACODYL (DULCOLAX) 10 MG SUPPOSITORY    Place 10 mg rectally as needed for moderate constipation.   DIGOXIN (LANOXIN) 0.125 MG TABLET    Take 1 tablet (0.125 mg total) by mouth daily.   DOCUSATE SODIUM (COLACE) 100 MG CAPSULE    Take 1 capsule (100 mg total) by mouth 2 (two) times daily as needed for mild constipation.   FERROUS SULFATE 325 (65 FE) MG TABLET    Take 325 mg by mouth 2 (two) times daily after a meal.   FLECAINIDE (TAMBOCOR) 50 MG TABLET    Take 50 mg by mouth 2 (two) times daily.   GABAPENTIN (NEURONTIN) 600 MG TABLET  Take 600 mg by mouth 3 (three) times daily.   HYDROCODONE-ACETAMINOPHEN (NORCO/VICODIN) 5-325 MG TABLET    Take one tablet by mouth every 8 hours as needed for pain (control)   INCRUSE ELLIPTA 62.5 MCG/INH AEPB    Inhale 1 puff into the lungs daily.   LEVALBUTEROL (XOPENEX) 0.63 MG/3ML NEBULIZER SOLUTION    Take 0.63 mg by nebulization 2 (two) times daily.   LEVALBUTEROL (XOPENEX) 1.25 MG/3ML NEBULIZER SOLUTION    Take 1.25 mg by nebulization  every 6 (six) hours as needed for wheezing.   MAGNESIUM HYDROXIDE (MILK OF MAGNESIA) 400 MG/5ML SUSPENSION    Take 30 mLs by mouth daily as needed for mild constipation.   PANTOPRAZOLE (PROTONIX) 40 MG TABLET    Take 40 mg by mouth daily.   SERTRALINE (ZOLOFT) 50 MG TABLET    Take 50 mg by mouth daily.   SODIUM PHOSPHATES (RA SALINE ENEMA) 19-7 GM/118ML ENEM    Place 1 each rectally as needed (for constipation).   TRAZODONE (DESYREL) 50 MG TABLET    Take 50 mg by mouth at bedtime.   UNABLE TO FIND    Med Name: Med Pass 120 mL twice daily  Modified Medications   Modified Medication Previous Medication   METHIMAZOLE (TAPAZOLE) 5 MG TABLET methimazole (TAPAZOLE) 5 MG tablet      Take 0.5 tablets (2.5 mg total) by mouth daily.    Take 5 mg by mouth daily.   METOPROLOL TARTRATE (LOPRESSOR) 25 MG TABLET metoprolol tartrate (LOPRESSOR) 25 MG tablet      Take 0.5 tablets (12.5 mg total) by mouth daily.    Take 0.5 tablets (12.5 mg total) by mouth 2 (two) times daily.  Discontinued Medications   FLUOXETINE (PROZAC) 10 MG TABLET    Take 10 mg by mouth daily.     Physical Exam: Vitals:   05/11/16 1441  BP: (!) 141/72  Pulse: 63  Resp: 18  Temp: 97.3 F (36.3 C)  TempSrc: Oral  SpO2: 96%    Physical Exam  Constitutional: She is oriented to person, place, and time. She appears well-developed. No distress.  Fail elderly female  HENT:  Head: Normocephalic and atraumatic.  Mouth/Throat: Oropharynx is clear and moist. No oropharyngeal exudate.  Eyes: Conjunctivae are normal. Pupils are equal, round, and reactive to light.  Neck: Normal range of motion. Neck supple.  Cardiovascular: Normal rate, regular rhythm and normal heart sounds.   Pulmonary/Chest: Effort normal and breath sounds normal.  Abdominal: Soft. Bowel sounds are normal.  Musculoskeletal: She exhibits no edema or tenderness.  Neurological: She is alert and oriented to person, place, and time.  Skin: Skin is warm and dry. She is  not diaphoretic.  Psychiatric: She has a normal mood and affect.    Labs reviewed: Basic Metabolic Panel:  Recent Labs  11/13/15 0129  11/14/15 0556  12/02/15 0459  01/23/16 0846 01/24/16 0343  01/26/16 1518 01/27/16 0424 01/28/16 0432 03/02/16  NA 139  < > 139  < > 137  137  < >  --  140  < > 136 139 138 140  K 3.6  < > 3.0*  < > 3.8  3.8  < >  --  3.6  < > 3.7 3.8 4.3 4.5  CL 106  --  109  < > 103  < >  --  101  < > 95* 93* 94*  --   CO2 25  --  24  < > 27  < >  --  29  < > 34* 36* 32  --   GLUCOSE 125*  --  86  < > 94  < >  --  86  < > 136* 92 79  --   BUN 13  < > 11  < > 8  8  < >  --  11  < > '17 17 18 18  '$ CREATININE 1.10*  < > 0.90  < > 0.96  1.0  < >  --  0.91  < > 0.98 1.03* 0.96 1.1  CALCIUM 7.3*  --  7.3*  < > 8.0*  < >  --  7.8*  < > 7.8* 8.3* 8.4*  --   MG 2.4  --  1.9  < > 1.8  --  1.8 2.0  --   --   --   --   --   PHOS 3.7  --  3.2  --   --   --  3.4  --   --   --   --   --   --   < > = values in this interval not displayed. Liver Function Tests:  Recent Labs  12/02/15 0459 12/10/15 1524 01/23/16 0846  AST 8* 8 12*  ALT 6* 6 10*  ALKPHOS 56 64 94  BILITOT 0.3 0.4 0.4  PROT 5.2* 6.8 4.7*  ALBUMIN 2.2* 3.1* 1.7*   No results for input(s): LIPASE, AMYLASE in the last 8760 hours.  Recent Labs  12/01/15 0047  AMMONIA 11   CBC:  Recent Labs  01/11/16 2145  01/23/16 0516  01/24/16 0343  01/25/16 0252  01/27/16 0424  01/28/16 0432 03/02/16 04/12/16  WBC 10.8*  < > 9.0  --  12.8*  --  9.4  --  4.3  --  4.6 5.8 5.4  NEUTROABS 9.3*  --  7.0  --  11.4*  --   --   --   --   --   --   --   --   HGB 9.6*  < > 8.6*  < > 9.4*  < > 8.7*  < > 8.8*  8.9*  < > 9.2* 9.9* 10.6*  HCT 30.5*  < > 28.6*  < > 30.2*  < > 28.4*  < > 29.0*  29.0*  < > 30.5* 31* 32*  MCV 93.3  < > 99.3  --  95.6  --  95.0  --  96.7  --  96.8  --   --   PLT 223  < > 177  --  204  --  187  --  207  --  227 192 157  < > = values in this interval not displayed. TSH:  Recent Labs   01/06/16 1608 01/23/16 1425 04/12/16  TSH 4.936* 3.459 8.01*   A1C: Lab Results  Component Value Date   HGBA1C 5.2 12/01/2015   Lipid Panel:  Recent Labs  12/01/15 0644  CHOL 107  HDL 27*  LDLCALC 59  TRIG 107  CHOLHDL 4.0     Digoxin (Lanoxin) 03/10/16: 0.80   Assessment/Plan 1. Bradycardia Stable at this time. conts on metoprolol 12.5 mg, dig and flecainide daily due to SVT  2. Hyperthyroidism Endocrine appt pending due to elevated TSH. Methimazole was decrease last month. Will follow up TSH at this time.   3. Chronic obstructive pulmonary disease, unspecified COPD type ([place]) COPD remains stable, conts on chronic O2 and ellipta with xopenex PRN.   4. Iron deficiency anemia, unspecified iron  deficiency anemia type Remains on iron, Hgb stable on lab work last month.    Carlos American. Harle Battiest  [facility] [phone] 8 am - 5 pm) [phone] (after hours)

## 2016-05-12 ENCOUNTER — Encounter: Payer: Self-pay | Admitting: *Deleted

## 2016-05-12 DIAGNOSIS — E039 Hypothyroidism, unspecified: Secondary | ICD-10-CM | POA: Diagnosis not present

## 2016-05-12 DIAGNOSIS — D509 Iron deficiency anemia, unspecified: Secondary | ICD-10-CM | POA: Diagnosis not present

## 2016-05-12 DIAGNOSIS — E0590 Thyrotoxicosis, unspecified without thyrotoxic crisis or storm: Secondary | ICD-10-CM | POA: Diagnosis not present

## 2016-05-12 DIAGNOSIS — I129 Hypertensive chronic kidney disease with stage 1 through stage 4 chronic kidney disease, or unspecified chronic kidney disease: Secondary | ICD-10-CM | POA: Diagnosis not present

## 2016-05-12 LAB — BASIC METABOLIC PANEL
BUN: 22 mg/dL — AB (ref 4–21)
CREATININE: 1.1 mg/dL (ref 0.5–1.1)
Glucose: 86 mg/dL
Potassium: 4.7 mmol/L (ref 3.4–5.3)
Sodium: 140 mmol/L (ref 137–147)

## 2016-05-12 LAB — HEPATIC FUNCTION PANEL
ALT: 8 U/L (ref 7–35)
AST: 13 U/L (ref 13–35)
Alkaline Phosphatase: 80 U/L (ref 25–125)
BILIRUBIN, TOTAL: 0.4 mg/dL

## 2016-05-12 LAB — TSH: TSH: 7.07 u[IU]/mL — AB (ref 0.41–5.90)

## 2016-06-07 ENCOUNTER — Telehealth: Payer: Self-pay | Admitting: Endocrinology

## 2016-06-07 NOTE — Telephone Encounter (Signed)
LM for pt to call back to schedule a follow up for thyroid

## 2016-06-15 ENCOUNTER — Encounter: Payer: Self-pay | Admitting: Nurse Practitioner

## 2016-06-15 ENCOUNTER — Ambulatory Visit: Payer: Medicare Other | Admitting: Cardiology

## 2016-06-15 ENCOUNTER — Non-Acute Institutional Stay (SKILLED_NURSING_FACILITY): Payer: Medicare Other | Admitting: Nurse Practitioner

## 2016-06-15 ENCOUNTER — Other Ambulatory Visit: Payer: Self-pay | Admitting: *Deleted

## 2016-06-15 DIAGNOSIS — R296 Repeated falls: Secondary | ICD-10-CM | POA: Diagnosis not present

## 2016-06-15 DIAGNOSIS — F339 Major depressive disorder, recurrent, unspecified: Secondary | ICD-10-CM

## 2016-06-15 DIAGNOSIS — R350 Frequency of micturition: Secondary | ICD-10-CM | POA: Diagnosis not present

## 2016-06-15 DIAGNOSIS — E0590 Thyrotoxicosis, unspecified without thyrotoxic crisis or storm: Secondary | ICD-10-CM | POA: Diagnosis not present

## 2016-06-15 DIAGNOSIS — I959 Hypotension, unspecified: Secondary | ICD-10-CM | POA: Diagnosis not present

## 2016-06-15 MED ORDER — HYDROCODONE-ACETAMINOPHEN 5-325 MG PO TABS: ORAL_TABLET | ORAL | 0 refills | Status: DC

## 2016-06-15 NOTE — Telephone Encounter (Signed)
[facility] [phone] Fax: [phone]  

## 2016-06-15 NOTE — Progress Notes (Deleted)
Electrophysiology Office Note   Date:  06/15/2016   ID:  Dawn [name], DOB 1940-10-15, MRN [id number]  PCP:  Lin Landsman, MD Primary Electrophysiologist:  Kizzy Olafson Meredith Leeds, MD    No chief complaint on file.    History of Present Illness: Dawn [name] is a 76 y.o. female who presents today for electrophysiology evaluation.   Hx COPD, anemia, HTN, Hyperthyroidism, CKD stage III andhip surgery who was admitted to the hospital in January 2018 from [place] where she isrecovering after her hipsurgery. Patient fell when trying to get up off the toilet and hit the back of her head against the toilet.   She had an episode of SVT when admitted for sepsis in November 2017. 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 She returns today with more runs of SVT with rates in the 150s.  She was started on metoprolol and flecainide at that time.Her metoprolol was recently stopped due to low BP.***  Today, denies symptoms of palpitations, chest pain, shortness of breath, orthopnea, PND, lower extremity edema, claudication, dizziness, presyncope, syncope, bleeding, or neurologic sequela. The patient is tolerating medications without difficulties and is otherwise without complaint today. ***   Past Medical History:  Diagnosis Date  . COPD (chronic obstructive pulmonary disease) ([place])   . History of anxiety   . History of depression   . Spinal stenosis   . UTI (urinary tract infection)    Past Surgical History:  Procedure Laterality Date  . FLEXIBLE SIGMOIDOSCOPY Left 11/18/2015   Procedure: FLEXIBLE SIGMOIDOSCOPY;  Surgeon: [name], MD;  Location: [place];  Service: Endoscopy;  Laterality: Left;  . FLEXIBLE SIGMOIDOSCOPY N/A 11/20/2015   Procedure: FLEXIBLE SIGMOIDOSCOPY;  Surgeon: [name], MD;  Location: [facility] ENDOSCOPY;  Service: Endoscopy;  Laterality: N/A;  . HIP ARTHROPLASTY Right 01/12/2016   Procedure: ARTHROPLASTY BIPOLAR HIP  (HEMIARTHROPLASTY);  Surgeon: [name], MD;  Location: [facility];  Service: Orthopedics;  Laterality: Right;     Current Outpatient Prescriptions  Medication Sig Dispense Refill  . ALPRAZolam (XANAX) 0.25 MG tablet Take 0.25 mg by mouth at bedtime.    . bisacodyl (DULCOLAX) 10 MG suppository Place 10 mg rectally as needed for moderate constipation.    . digoxin (LANOXIN) 0.125 MG tablet Take 1 tablet (0.125 mg total) by mouth daily. 30 tablet 0  . docusate sodium (COLACE) 100 MG capsule Take 1 capsule (100 mg total) by mouth 2 (two) times daily as needed for mild constipation. 10 capsule 0  . ferrous sulfate 325 (65 FE) MG tablet Take 325 mg by mouth 2 (two) times daily after a meal.    . flecainide (TAMBOCOR) 50 MG tablet Take 50 mg by mouth 2 (two) times daily.    Marland Kitchen gabapentin (NEURONTIN) 600 MG tablet Take 600 mg by mouth 3 (three) times daily.    Marland Kitchen HYDROcodone-acetaminophen (NORCO/VICODIN) 5-325 MG tablet Take one tablet by mouth every 8 hours as needed for pain (control) 90 tablet 0  . INCRUSE ELLIPTA 62.5 MCG/INH AEPB Inhale 1 puff into the lungs daily.  3  . levalbuterol (XOPENEX) 0.63 MG/3ML nebulizer solution Take 0.63 mg by nebulization 2 (two) times daily.    Marland Kitchen levalbuterol (XOPENEX) 1.25 MG/3ML nebulizer solution Take 1.25 mg by nebulization every 6 (six) hours as needed for wheezing.    . magnesium hydroxide (MILK OF MAGNESIA) 400 MG/5ML suspension Take 30 mLs by mouth daily as needed for mild constipation.    . methimazole (TAPAZOLE) 5 MG  tablet Take 0.5 tablets (2.5 mg total) by mouth daily.    . metoprolol tartrate (LOPRESSOR) 25 MG tablet Take 0.5 tablets (12.5 mg total) by mouth daily. 60 tablet 0  . pantoprazole (PROTONIX) 40 MG tablet Take 40 mg by mouth daily.    . sertraline (ZOLOFT) 50 MG tablet Take 50 mg by mouth daily.    . Sodium Phosphates (RA SALINE ENEMA) 19-7 GM/118ML ENEM Place 1 each rectally as needed (for constipation).    . traZODone (DESYREL) 50 MG tablet  Take 50 mg by mouth at bedtime.    Marland Kitchen UNABLE TO FIND Med Name: Med Pass 120 mL twice daily     No current facility-administered medications for this visit.     Allergies:   Patient has no known allergies.   Social History:  The patient  reports that she has quit smoking. She has never used smokeless tobacco. She reports that she does not drink alcohol or use drugs.   Family History:  The patient's family history includes Diabetes in her father; Heart attack in her mother.    ROS:  Please see the history of present illness.   Otherwise, review of systems is positive for ***.   All other systems are reviewed and negative.     PHYSICAL EXAM: VS:  There were no vitals taken for this visit. , BMI There is no height or weight on file to calculate BMI. GEN: Well nourished, well developed, in no acute distress  HEENT: normal  Neck: no JVD, carotid bruits, or masses Cardiac: ***RRR; no murmurs, rubs, or gallops,no edema  Respiratory:  clear to auscultation bilaterally, normal work of breathing GI: soft, nontender, nondistended, + BS MS: no deformity or atrophy  Skin: warm and dry Neuro:  Strength and sensation are intact Psych: euthymic mood, full affect  EKG:  EKG {ACTION; IS/IS [id number]} ordered today. Personal review of the ekg ordered *** shows ***  Recent Labs: 01/24/2016: Magnesium 2.0 04/12/2016: Hemoglobin 10.6; Platelets 157 05/12/2016: ALT 8; BUN 22; Creatinine 1.1; Potassium 4.7; Sodium 140; TSH 7.07    Lipid Panel     Component Value Date/Time   CHOL 107 12/01/2015 0644   TRIG 107 12/01/2015 0644   HDL 27 (L) 12/01/2015 0644   CHOLHDL 4.0 12/01/2015 0644   VLDL 21 12/01/2015 0644   LDLCALC 59 12/01/2015 0644     Wt Readings from Last 3 Encounters:  04/06/16 126 lb 6.4 oz (57.3 kg)  03/15/16 132 lb (59.9 kg)  03/15/16 122 lb (55.3 kg)      Other studies Reviewed: Additional studies/ records that were reviewed today include: TTE 01/14/16  Review of the above  records today demonstrates:  - Left ventricle: The cavity size was normal. Wall thickness was   increased in a pattern of moderate LVH. Systolic function was   normal. The estimated ejection fraction was in the range of 50%   to 55%. Doppler parameters are consistent with abnormal left   ventricular relaxation (grade 1 diastolic dysfunction). The E/e&'   ratio is <8, suggesting normal LV filling pressure. - Aortic valve: Mildly calcified leaflets. There was mild   regurgitation. - Left atrium: The atrium was mildly dilated. - Right ventricle: The cavity size was mildly dilated. The   moderator band was prominent. Systolic function was normal. - Right atrium: The atrium was mildly dilated. - Tricuspid valve: There was moderate regurgitation. - Pulmonary arteries: PA peak pressure: 64 mm Hg (S). - Inferior vena cava: The vessel was  dilated. The respirophasic   diameter changes were blunted (< 50%), consistent with elevated   central venous pressure.  Telemetry 12/31/15  Basic rhythm is NSR  Recurrent asymptomatic WCT, presumed SVT with aberration vs VT(less likely).  No excessive bradycardia.   Abnormal monitor. Recurrent asymptomatic WCT, likely SVT.  Clinical correlation needed.  ASSESSMENT AND PLAN:  1.  SVT: He is feeling well currently not having any palpitations prior to her monitor, she was having episodes of fluttering in her chest. It appears that the flecainide is helping to control her episodes of SVT. We'll continue her flecainide at the current dose. If her blood pressure improves, she would benefit from increasing her dose of metoprolol.***  2.  Hypertension: Currently on the lowest dose of metoprolol. We'll stop her Lasix today. Should continue to be monitored by her nursing facility.***    Current medicines are reviewed at length with the patient today.   The patient does not have concerns regarding her medicines.  The following changes were made today:   ***  Labs/ tests ordered today include:  No orders of the defined types were placed in this encounter.    Disposition:   FU with Ulysses Alper *** months  Signed, Phuong Moffatt Meredith Leeds, MD  06/15/2016 7:33 AM     [facility] [address] [place] [facility] [place] [address] [phone] (office) [phone] (fax)

## 2016-06-15 NOTE — Progress Notes (Signed)
Patient ID: MYKELTI GOLDENSTEIN, female   DOB: 1940/10/11, 76 y.o.   MRN: [id number]      Nursing Home Location:  [facility] Room number: [place] of Service: SNF (31)  PCP: Lin Landsman, MD  No Known Allergies  Chief Complaint  Patient presents with  . Medical Management of Chronic Issues    Resident is being seen for a routine visit.     HPI:  Patient is a 76 y.o. female seen today at [facility] for routine follow up. Pt with hx of hyperthyroidism, COPD, anemia, depression. Endocrine referral requested due to elevated TSH in the setting of hyperthyroid. Tapazole was adjusted and TSH is slightly better.  Pt has not been to endocrine due to payment needed before appt can be made. Family has been notified of this per staff.  Pt reports sleep has not been good due to urinary frequency. Getting up several times at night to go to the rest room.  Reports on going pain to right hip and left leg. currently taking Norco for this every 8 hours.  Review of Systems:  Review of Systems  Constitutional: Negative for activity change, appetite change, chills, fatigue, fever and unexpected weight change.  HENT: Negative for tinnitus.   Eyes: Negative.   Respiratory: Negative for cough, chest tightness, shortness of breath and wheezing.   Cardiovascular: Negative for chest pain, palpitations and leg swelling.  Gastrointestinal: Negative for abdominal pain, constipation and diarrhea.  Genitourinary: Negative for difficulty urinating, dysuria, frequency and urgency.  Musculoskeletal: Positive for arthralgias, back pain and myalgias.  Skin: Negative.  Negative for color change and wound.  Neurological: Negative for dizziness, weakness and headaches.  Psychiatric/Behavioral: Positive for dysphoric mood and sleep disturbance. Negative for agitation, behavioral problems and confusion. The patient is nervous/anxious.     Past Medical History:  Diagnosis Date  . COPD  (chronic obstructive pulmonary disease) ([place])   . History of anxiety   . History of depression   . Spinal stenosis   . UTI (urinary tract infection)    Past Surgical History:  Procedure Laterality Date  . FLEXIBLE SIGMOIDOSCOPY Left 11/18/2015   Procedure: FLEXIBLE SIGMOIDOSCOPY;  Surgeon: [name], MD;  Location: [facility];  Service: Endoscopy;  Laterality: Left;  . FLEXIBLE SIGMOIDOSCOPY N/A 11/20/2015   Procedure: FLEXIBLE SIGMOIDOSCOPY;  Surgeon: [name], MD;  Location: [facility] ENDOSCOPY;  Service: Endoscopy;  Laterality: N/A;  . HIP ARTHROPLASTY Right 01/12/2016   Procedure: ARTHROPLASTY BIPOLAR HIP (HEMIARTHROPLASTY);  Surgeon: [name], MD;  Location: [facility];  Service: Orthopedics;  Laterality: Right;   Social History:   reports that she has quit smoking. She has never used smokeless tobacco. She reports that she does not drink alcohol or use drugs.  Family History  Problem Relation Age of Onset  . Heart attack Mother   . Diabetes Father   . Thyroid disease Neg Hx     Medications: Patient's Medications  New Prescriptions   No medications on file  Previous Medications   BISACODYL (DULCOLAX) 10 MG SUPPOSITORY    Place 10 mg rectally as needed for moderate constipation.   DIGOXIN (LANOXIN) 0.125 MG TABLET    Take 1 tablet (0.125 mg total) by mouth daily.   DOCUSATE SODIUM (COLACE) 100 MG CAPSULE    Take 1 capsule (100 mg total) by mouth 2 (two) times daily as needed for mild constipation.   FERROUS SULFATE 325 (65 FE) MG TABLET    Take 325 mg by mouth  2 (two) times daily after a meal.   FLECAINIDE (TAMBOCOR) 50 MG TABLET    Take 50 mg by mouth 2 (two) times daily.   GABAPENTIN (NEURONTIN) 600 MG TABLET    Take 600 mg by mouth 3 (three) times daily.   HYDROCODONE-ACETAMINOPHEN (NORCO/VICODIN) 5-325 MG TABLET    Take 1 tablet by mouth every 12 (twelve) hours.   INCRUSE ELLIPTA 62.5 MCG/INH AEPB    Inhale 1 puff into the lungs daily.   LEVALBUTEROL (XOPENEX) 0.63 MG/3ML  NEBULIZER SOLUTION    Take 0.63 mg by nebulization 2 (two) times daily.   LEVALBUTEROL (XOPENEX) 1.25 MG/3ML NEBULIZER SOLUTION    Take 1.25 mg by nebulization every 6 (six) hours as needed for wheezing.   MAGNESIUM HYDROXIDE (MILK OF MAGNESIA) 400 MG/5ML SUSPENSION    Take 30 mLs by mouth daily as needed for mild constipation.   METHIMAZOLE (TAPAZOLE) 5 MG TABLET    Take 0.5 tablets (2.5 mg total) by mouth daily.   METOPROLOL TARTRATE (LOPRESSOR) 25 MG TABLET    Take 0.5 tablets (12.5 mg total) by mouth daily.   PANTOPRAZOLE (PROTONIX) 40 MG TABLET    Take 40 mg by mouth daily.   SERTRALINE (ZOLOFT) 50 MG TABLET    Take 50 mg by mouth daily.   SODIUM PHOSPHATES (RA SALINE ENEMA) 19-7 GM/118ML ENEM    Place 1 each rectally as needed (for constipation).   TRAZODONE (DESYREL) 50 MG TABLET    Take 50 mg by mouth at bedtime.   UNABLE TO FIND    Med Name: Med Pass 120 mL twice daily  Modified Medications   No medications on file  Discontinued Medications   ALPRAZOLAM (XANAX) 0.25 MG TABLET    Take 0.25 mg by mouth at bedtime.   HYDROCODONE-ACETAMINOPHEN (NORCO/VICODIN) 5-325 MG TABLET    Take one tablet by mouth every 8 hours as needed for pain (control)     Physical Exam: Vitals:   06/15/16 0943  BP: 119/71  Pulse: 64  Resp: 16  Temp: 97.1 F (36.2 C)  SpO2: 95%  Weight: 130 lb 12.8 oz (59.3 kg)  Height: 5\' 8"  (1.727 m)    Physical Exam  Constitutional: She is oriented to person, place, and time. She appears well-developed. No distress.  Frail elderly female  HENT:  Head: Normocephalic and atraumatic.  Mouth/Throat: Oropharynx is clear and moist. No oropharyngeal exudate.  Eyes: Conjunctivae are normal. Pupils are equal, round, and reactive to light.  Neck: Normal range of motion. Neck supple.  Cardiovascular: Normal rate, regular rhythm and normal heart sounds.   Pulmonary/Chest: Effort normal and breath sounds normal.  Abdominal: Soft. Bowel sounds are normal.    Musculoskeletal: She exhibits no edema or tenderness.  Neurological: She is alert and oriented to person, place, and time.  Skin: Skin is warm and dry. She is not diaphoretic.  Psychiatric: She has a normal mood and affect.    Labs reviewed: Basic Metabolic Panel:  Recent Labs  11/13/15 0129  11/14/15 0556  12/02/15 0459  01/23/16 0846 01/24/16 0343  01/26/16 1518 01/27/16 0424 01/28/16 0432 03/02/16 05/12/16  NA 139  < > 139  < > 137  137  < >  --  140  < > 136 139 138 140 140  K 3.6  < > 3.0*  < > 3.8  3.8  < >  --  3.6  < > 3.7 3.8 4.3 4.5 4.7  CL 106  --  109  < >  103  < >  --  101  < > 95* 93* 94*  --   --   CO2 25  --  24  < > 27  < >  --  29  < > 34* 36* 32  --   --   GLUCOSE 125*  --  86  < > 94  < >  --  86  < > 136* 92 79  --   --   BUN 13  < > 11  < > 8  8  < >  --  11  < > 17 17 18 18  22*  CREATININE 1.10*  < > 0.90  < > 0.96  1.0  < >  --  0.91  < > 0.98 1.03* 0.96 1.1 1.1  CALCIUM 7.3*  --  7.3*  < > 8.0*  < >  --  7.8*  < > 7.8* 8.3* 8.4*  --   --   MG 2.4  --  1.9  < > 1.8  --  1.8 2.0  --   --   --   --   --   --   PHOS 3.7  --  3.2  --   --   --  3.4  --   --   --   --   --   --   --   < > = values in this interval not displayed. Liver Function Tests:  Recent Labs  12/02/15 0459 12/10/15 1524 01/23/16 0846 05/12/16  AST 8* 8 12* 13  ALT 6* 6 10* 8  ALKPHOS 56 64 94 80  BILITOT 0.3 0.4 0.4  --   PROT 5.2* 6.8 4.7*  --   ALBUMIN 2.2* 3.1* 1.7*  --    No results for input(s): LIPASE, AMYLASE in the last 8760 hours.  Recent Labs  12/01/15 0047  AMMONIA 11   CBC:  Recent Labs  01/11/16 2145  01/23/16 0516  01/24/16 0343  01/25/16 0252  01/27/16 0424  01/28/16 0432 03/02/16 04/12/16  WBC 10.8*  < > 9.0  --  12.8*  --  9.4  --  4.3  --  4.6 5.8 5.4  NEUTROABS 9.3*  --  7.0  --  11.4*  --   --   --   --   --   --   --   --   HGB 9.6*  < > 8.6*  < > 9.4*  < > 8.7*  < > 8.8*  8.9*  < > 9.2* 9.9* 10.6*  HCT 30.5*  < > 28.6*  < > 30.2*  < >  28.4*  < > 29.0*  29.0*  < > 30.5* 31* 32*  MCV 93.3  < > 99.3  --  95.6  --  95.0  --  96.7  --  96.8  --   --   PLT 223  < > 177  --  204  --  187  --  207  --  227 192 157  < > = values in this interval not displayed. TSH:  Recent Labs  01/23/16 1425 04/12/16 05/12/16  TSH 3.459 8.01* 7.07*   A1C: Lab Results  Component Value Date   HGBA1C 5.2 12/01/2015   Lipid Panel:  Recent Labs  12/01/15 0644  CHOL 107  HDL 27*  LDLCALC 59  TRIG 107  CHOLHDL 4.0     Digoxin (Lanoxin) 03/10/16: 0.80   Assessment/Plan 1. Hyperthyroidism Staff to follow  up on appt. Will get TSH, Free T3 and T4  2. Depression, recurrent ([place]) Reports ongoing depression. Will make sure pt is being seen by psych. conts on zoloft 50 mg daily.   3. Recurrent falls In the past, most likely multifactorial. Plan to reduce narcotic usage and avoid hypotension. Parameters have been written for beta blocker.  No recent falls.  4. Urinary frequency Will get UA C&S at this time. May be due to OAB and medication may be beneficial.   5. Hypotension, unspecified hypotension type Ongoing however improved with betablocker being held    [place]. Harle Battiest  [facility] [phone] 8 am - 5 pm) [phone] (after hours)

## 2016-06-16 ENCOUNTER — Encounter: Payer: Self-pay | Admitting: Cardiology

## 2016-06-16 DIAGNOSIS — E0590 Thyrotoxicosis, unspecified without thyrotoxic crisis or storm: Secondary | ICD-10-CM | POA: Diagnosis not present

## 2016-06-16 DIAGNOSIS — E039 Hypothyroidism, unspecified: Secondary | ICD-10-CM | POA: Diagnosis not present

## 2016-06-16 LAB — TSH: TSH: 5.61 (ref 0.41–5.90)

## 2016-06-20 NOTE — Telephone Encounter (Signed)
Called and spoke with the patients daughter to get her scheduled for FU appt. The daughter was just getting to sleep and needs to call us back tomorrow or Wednesday

## 2016-06-29 DIAGNOSIS — G4700 Insomnia, unspecified: Secondary | ICD-10-CM | POA: Diagnosis not present

## 2016-06-29 DIAGNOSIS — F339 Major depressive disorder, recurrent, unspecified: Secondary | ICD-10-CM | POA: Diagnosis not present

## 2016-07-04 DIAGNOSIS — M7061 Trochanteric bursitis, right hip: Secondary | ICD-10-CM | POA: Diagnosis not present

## 2016-07-04 DIAGNOSIS — M5136 Other intervertebral disc degeneration, lumbar region: Secondary | ICD-10-CM | POA: Diagnosis not present

## 2016-07-04 DIAGNOSIS — M25551 Pain in right hip: Secondary | ICD-10-CM | POA: Diagnosis not present

## 2016-07-04 DIAGNOSIS — Z471 Aftercare following joint replacement surgery: Secondary | ICD-10-CM | POA: Diagnosis not present

## 2016-07-04 DIAGNOSIS — Z96641 Presence of right artificial hip joint: Secondary | ICD-10-CM | POA: Diagnosis not present

## 2016-07-05 DIAGNOSIS — M5136 Other intervertebral disc degeneration, lumbar region: Secondary | ICD-10-CM | POA: Diagnosis not present

## 2016-07-12 ENCOUNTER — Encounter: Payer: Self-pay | Admitting: Adult Health

## 2016-07-12 ENCOUNTER — Non-Acute Institutional Stay (SKILLED_NURSING_FACILITY): Payer: Medicare Other | Admitting: Adult Health

## 2016-07-12 DIAGNOSIS — Z8719 Personal history of other diseases of the digestive system: Secondary | ICD-10-CM | POA: Diagnosis not present

## 2016-07-12 DIAGNOSIS — G4700 Insomnia, unspecified: Secondary | ICD-10-CM | POA: Diagnosis not present

## 2016-07-12 DIAGNOSIS — D509 Iron deficiency anemia, unspecified: Secondary | ICD-10-CM

## 2016-07-12 DIAGNOSIS — J449 Chronic obstructive pulmonary disease, unspecified: Secondary | ICD-10-CM | POA: Diagnosis not present

## 2016-07-12 DIAGNOSIS — G629 Polyneuropathy, unspecified: Secondary | ICD-10-CM

## 2016-07-12 DIAGNOSIS — E0590 Thyrotoxicosis, unspecified without thyrotoxic crisis or storm: Secondary | ICD-10-CM

## 2016-07-12 DIAGNOSIS — F339 Major depressive disorder, recurrent, unspecified: Secondary | ICD-10-CM

## 2016-07-12 DIAGNOSIS — I471 Supraventricular tachycardia: Secondary | ICD-10-CM

## 2016-07-12 NOTE — Progress Notes (Signed)
DATE:  07/12/2016  MRN:  [id number]  BIRTHDAY: May 11, 1940  Facility:  Nursing Home Location:  [facility] Room Number: 301-B  LEVEL OF CARE:  SNF (31)  Contact Information    Name [place] Daughter  [phone]       Code Status History    Date Active Date Inactive Code Status Order ID Comments User Context   01/23/2016  8:34 AM 01/29/2016 12:41 AM Full Code [id number]  Brenton Grills, PA-C ED   01/12/2016  1:28 AM 01/15/2016  7:21 PM Full Code [id number]  Edwin Dada, MD Inpatient   12/01/2015 12:37 AM 12/04/2015  6:44 PM Full Code [id number]  [name], MD Inpatient   11/10/2015  9:46 AM 11/20/2015 10:50 PM Full Code [id number]  Samella Parr, NP Inpatient       Chief Complaint  Patient presents with  . Medical Management of Chronic Issues    Routine visit    HISTORY OF PRESENT ILLNESS:  This is a 32-YO female seen for a routine visit.  She was recently started on Remeron 15 mg Q HS to increase appetite/depression/insomnia by psych NP. She was seen in the room. She was smiling and denies any concerns. She has PMH of hyperthyroidism, COPD, anemia and depression.    PAST MEDICAL HISTORY:  Past Medical History:  Diagnosis Date  . COPD (chronic obstructive pulmonary disease) ([place])   . History of anxiety   . History of depression   . Spinal stenosis   . UTI (urinary tract infection)      CURRENT MEDICATIONS: Reviewed  Patient's Medications  New Prescriptions   No medications on file  Previous Medications   BISACODYL (DULCOLAX) 10 MG SUPPOSITORY    Place 10 mg rectally as needed for moderate constipation.   DIGOXIN (LANOXIN) 0.125 MG TABLET    Take 1 tablet (0.125 mg total) by mouth daily.   DOCUSATE SODIUM (COLACE) 100 MG CAPSULE    Take 1 capsule (100 mg total) by mouth 2 (two) times daily as needed for mild constipation.   FERROUS SULFATE 325 (65 FE) MG TABLET     Take 325 mg by mouth 2 (two) times daily after a meal.   FLECAINIDE (TAMBOCOR) 50 MG TABLET    Take 50 mg by mouth 2 (two) times daily.   GABAPENTIN (NEURONTIN) 600 MG TABLET    Take 600 mg by mouth 3 (three) times daily.   HYDROCODONE-ACETAMINOPHEN (NORCO/VICODIN) 5-325 MG TABLET    Take 1 tablet by mouth every 12 (twelve) hours as needed for severe pain.   INCRUSE ELLIPTA 62.5 MCG/INH AEPB    Inhale 1 puff into the lungs daily.    LEVALBUTEROL (XOPENEX) 0.63 MG/3ML NEBULIZER SOLUTION    Take 0.63 mg by nebulization 2 (two) times daily.   LEVALBUTEROL (XOPENEX) 1.25 MG/3ML NEBULIZER SOLUTION    Take 1.25 mg by nebulization every 6 (six) hours as needed for wheezing.   MAGNESIUM HYDROXIDE (MILK OF MAGNESIA) 400 MG/5ML SUSPENSION    Take 30 mLs by mouth daily as needed for mild constipation.   METHIMAZOLE (TAPAZOLE) 5 MG TABLET    Take 0.5 tablets (2.5 mg total) by mouth daily.   METOPROLOL TARTRATE (LOPRESSOR) 25 MG TABLET    Take 0.5 tablets (12.5 mg total) by mouth daily.   MIRTAZAPINE (REMERON) 15 MG TABLET    Take 15 mg by mouth at bedtime.   PANTOPRAZOLE (PROTONIX) 40 MG TABLET  Take 40 mg by mouth daily.   SERTRALINE (ZOLOFT) 50 MG TABLET    Take 50 mg by mouth daily.   SODIUM PHOSPHATES (RA SALINE ENEMA) 19-7 GM/118ML ENEM    Place 1 each rectally as needed (for constipation).   TRAZODONE (DESYREL) 50 MG TABLET    Take 50 mg by mouth at bedtime.    UNABLE TO FIND    Med Name: Med Pass 120 mL twice daily  Modified Medications   No medications on file  Discontinued Medications   HYDROCODONE-ACETAMINOPHEN (NORCO/VICODIN) 5-325 MG TABLET    Take one tablet by mouth every 8 hours as needed for pain. (control)     No Known Allergies   REVIEW OF SYSTEMS:  GENERAL: no change in appetite, no fatigue, no weight changes, no fever, chills or weakness EYES: Denies change in vision, dry eyes, eye pain, itching or discharge EARS: Denies change in hearing, ringing in ears, or earache NOSE:  Denies nasal congestion or epistaxis MOUTH and THROAT: Denies oral discomfort, gingival pain or bleeding, pain from teeth or hoarseness   RESPIRATORY: no cough, SOB, DOE, wheezing, hemoptysis CARDIAC: no chest pain, edema or palpitations GI: no abdominal pain, diarrhea, constipation, heart burn, nausea or vomiting GU: Denies dysuria, frequency, hematuria, incontinence, or discharge PSYCHIATRIC: Denies feeling of depression or anxiety. No report of hallucinations, insomnia, paranoia, or agitation   PHYSICAL EXAMINATION  GENERAL APPEARANCE:  In no acute distress.  SKIN:  Skin is warm and dry.  HEAD: Normal in size and contour. No evidence of trauma EYES: Lids open and close normally. No blepharitis, entropion or ectropion. PERRL. Conjunctivae are clear and sclerae are white. Lenses are without opacity EARS: Pinnae are normal. Patient hears normal voice tunes of the examiner MOUTH and THROAT: Lips are without lesions. Oral mucosa is moist and without lesions. Tongue is normal in shape, size, and color and without lesions NECK: supple, trachea midline, no neck masses, no thyroid tenderness, no thyromegaly LYMPHATICS: no LAN in the neck, no supraclavicular LAN RESPIRATORY: breathing is even & unlabored, BS CTAB CARDIAC: RRR, no murmur,no extra heart sounds, no edema GI: abdomen soft, normal BS, no masses, no tenderness, no hepatomegaly, no splenomegaly EXTREMITIES:  Able to move X 4 extremities, walks with walker PSYCHIATRIC: Alert and oriented X 3. Affect and behavior are appropriate   LABS/RADIOLOGY: Labs reviewed: Basic Metabolic Panel:  Recent Labs  11/13/15 0129  11/14/15 0556  12/02/15 0459  01/23/16 0846 01/24/16 0343  01/26/16 1518 01/27/16 0424 01/28/16 0432 03/02/16 05/12/16  NA 139  < > 139  < > 137  137  < >  --  140  < > 136 139 138 140 140  K 3.6  < > 3.0*  < > 3.8  3.8  < >  --  3.6  < > 3.7 3.8 4.3 4.5 4.7  CL 106  --  109  < > 103  < >  --  101  < > 95* 93* 94*   --   --   CO2 25  --  24  < > 27  < >  --  29  < > 34* 36* 32  --   --   GLUCOSE 125*  --  86  < > 94  < >  --  86  < > 136* 92 79  --   --   BUN 13  < > 11  < > 8  8  < >  --  11  < >  17 17 18 18  22*  CREATININE 1.10*  < > 0.90  < > 0.96  1.0  < >  --  0.91  < > 0.98 1.03* 0.96 1.1 1.1  CALCIUM 7.3*  --  7.3*  < > 8.0*  < >  --  7.8*  < > 7.8* 8.3* 8.4*  --   --   MG 2.4  --  1.9  < > 1.8  --  1.8 2.0  --   --   --   --   --   --   PHOS 3.7  --  3.2  --   --   --  3.4  --   --   --   --   --   --   --   < > = values in this interval not displayed. Liver Function Tests:  Recent Labs  12/02/15 0459 12/10/15 1524 01/23/16 0846 05/12/16  AST 8* 8 12* 13  ALT 6* 6 10* 8  ALKPHOS 56 64 94 80  BILITOT 0.3 0.4 0.4  --   PROT 5.2* 6.8 4.7*  --   ALBUMIN 2.2* 3.1* 1.7*  --     Recent Labs  12/01/15 0047  AMMONIA 11   CBC:  Recent Labs  01/11/16 2145  01/23/16 0516  01/24/16 0343  01/25/16 0252  01/27/16 0424  01/28/16 0432 03/02/16 04/12/16  WBC 10.8*  < > 9.0  --  12.8*  --  9.4  --  4.3  --  4.6 5.8 5.4  NEUTROABS 9.3*  --  7.0  --  11.4*  --   --   --   --   --   --   --   --   HGB 9.6*  < > 8.6*  < > 9.4*  < > 8.7*  < > 8.8*  8.9*  < > 9.2* 9.9* 10.6*  HCT 30.5*  < > 28.6*  < > 30.2*  < > 28.4*  < > 29.0*  29.0*  < > 30.5* 31* 32*  MCV 93.3  < > 99.3  --  95.6  --  95.0  --  96.7  --  96.8  --   --   PLT 223  < > 177  --  204  --  187  --  207  --  227 192 157  < > = values in this interval not displayed. Lipid Panel:  Recent Labs  12/01/15 0644  HDL 27*   Cardiac Enzymes:  Recent Labs  01/13/16 0718 01/23/16 0846 01/23/16 1425  TROPONINI 0.06* <0.03 <0.03   CBG:  Recent Labs  11/30/15 1911 01/25/16 1601  GLUCAP 79 107*     ASSESSMENT/PLAN:  1. Polyneuropathy - stable, Continue gabapentin 600 mg 1 tab by mouth 3 times a day and Norco 5-325 mg 1 tab by mouth every 12 hours when necessary   2. Depression, recurrent ([place]) - was smiling today,  continue sertraline 50 mg 1 tab by mouth daily and Remeron 15 mg 1 tab by mouth daily at bedtime, followed up by [facility] NP   3. Hyperthyroidism - continue methimazole 2.5 mg 1 tab by mouth daily and for TSH, free T4 and free T3 on 08/22/16 Lab Results  Component Value Date   TSH 5.61 06/16/2016    4. Chronic obstructive pulmonary disease, unspecified COPD type ([place]) - no wheezing; continue levalbuterol 1.25 mg/3 mL 1 vial via nebulizer every 6 hours when necessary and  BID and [employer] Ell[employer]a 62.5 g INH inhaled 1 puff by mouth daily  5. 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 History of GI bleed - no signs of bleeding, continue pantoprazole 40 mg 1 tab by mouth daily   8.  Insomnia, unspecified type - continue trazodone 50 mg 1 tab by mouth daily at bedtime     Goals of care:  Long-term care    Jaline Pincock C. [place] - NP    [employer] [phone]

## 2016-07-29 DIAGNOSIS — M5412 Radiculopathy, cervical region: Secondary | ICD-10-CM | POA: Diagnosis not present

## 2016-07-29 DIAGNOSIS — M5416 Radiculopathy, lumbar region: Secondary | ICD-10-CM | POA: Diagnosis not present

## 2016-08-10 ENCOUNTER — Non-Acute Institutional Stay (SKILLED_NURSING_FACILITY): Payer: Medicare Other | Admitting: Adult Health

## 2016-08-10 ENCOUNTER — Encounter: Payer: Self-pay | Admitting: Adult Health

## 2016-08-10 DIAGNOSIS — J449 Chronic obstructive pulmonary disease, unspecified: Secondary | ICD-10-CM | POA: Diagnosis not present

## 2016-08-10 DIAGNOSIS — D509 Iron deficiency anemia, unspecified: Secondary | ICD-10-CM | POA: Diagnosis not present

## 2016-08-10 DIAGNOSIS — H538 Other visual disturbances: Secondary | ICD-10-CM

## 2016-08-10 DIAGNOSIS — E0590 Thyrotoxicosis, unspecified without thyrotoxic crisis or storm: Secondary | ICD-10-CM

## 2016-08-10 DIAGNOSIS — G629 Polyneuropathy, unspecified: Secondary | ICD-10-CM | POA: Diagnosis not present

## 2016-08-10 DIAGNOSIS — G4700 Insomnia, unspecified: Secondary | ICD-10-CM | POA: Diagnosis not present

## 2016-08-10 DIAGNOSIS — I471 Supraventricular tachycardia: Secondary | ICD-10-CM

## 2016-08-10 DIAGNOSIS — F339 Major depressive disorder, recurrent, unspecified: Secondary | ICD-10-CM | POA: Diagnosis not present

## 2016-08-10 NOTE — Progress Notes (Signed)
DATE:  08/10/2016   MRN:  [id number]  BIRTHDAY: April 20, 1940  Facility:  Nursing Home Location:  [facility] Room Number: 301-B  LEVEL OF CARE:  SNF (31)  Contact Information    Name Sherando Daughter  [phone]       Code Status History    Date Active Date Inactive Code Status Order ID Comments User Context   01/23/2016  8:34 AM 01/29/2016 12:41 AM Full Code [id number]  Brenton Grills, PA-C ED   01/12/2016  1:28 AM 01/15/2016  7:21 PM Full Code [id number]  Edwin Dada, MD Inpatient   12/01/2015 12:37 AM 12/04/2015  6:44 PM Full Code [id number]  [name], MD Inpatient   11/10/2015  9:46 AM 11/20/2015 10:50 PM Full Code [id number]  Samella Parr, NP Inpatient       Chief Complaint  Patient presents with  . Medical Management of Chronic Issues    Routine visit    HISTORY OF PRESENT ILLNESS:  This is a 62-YO female seen for a routine visit.  She is a long-term care resident at [facility]. She has a PMH of hyperthyroidism, COPD, anemia and depression. She was seen in the room today. She verbalized that she cannot read the newspaper but able to see faces. She was requesting to see an ophthalmologist. She said that she had blurry vision for a while now and she does not have eye pain. She used to have eyeglasses but does not know if she lost it at home or in the hospital.    PAST MEDICAL HISTORY:  Past Medical History:  Diagnosis Date  . COPD (chronic obstructive pulmonary disease) ([place])   . History of anxiety   . History of depression   . Spinal stenosis   . UTI (urinary tract infection)      CURRENT MEDICATIONS: Reviewed  Patient's Medications  New Prescriptions   No medications on file  Previous Medications   BISACODYL (DULCOLAX) 10 MG SUPPOSITORY    Place 10 mg rectally as needed for moderate constipation.   DIGOXIN (LANOXIN) 0.125 MG TABLET     Take 1 tablet (0.125 mg total) by mouth daily.   DOCUSATE SODIUM (COLACE) 100 MG CAPSULE    Take 1 capsule (100 mg total) by mouth 2 (two) times daily as needed for mild constipation.   FERROUS SULFATE 325 (65 FE) MG TABLET    Take 325 mg by mouth 2 (two) times daily after a meal.   FLECAINIDE (TAMBOCOR) 50 MG TABLET    Take 50 mg by mouth 2 (two) times daily.   GABAPENTIN (NEURONTIN) 600 MG TABLET    Take 600 mg by mouth 3 (three) times daily.   HYDROCODONE-ACETAMINOPHEN (NORCO/VICODIN) 5-325 MG TABLET    Take 1 tablet by mouth every 12 (twelve) hours as needed for severe pain.   INCRUSE ELLIPTA 62.5 MCG/INH AEPB    Inhale 1 puff into the lungs daily.    LEVALBUTEROL (XOPENEX) 0.63 MG/3ML NEBULIZER SOLUTION    Take 0.63 mg by nebulization 2 (two) times daily.   LEVALBUTEROL (XOPENEX) 1.25 MG/3ML NEBULIZER SOLUTION    Take 1.25 mg by nebulization every 6 (six) hours as needed for wheezing.   MAGNESIUM HYDROXIDE (MILK OF MAGNESIA) 400 MG/5ML SUSPENSION    Take 30 mLs by mouth daily as needed for mild constipation.   METHIMAZOLE (TAPAZOLE) 5 MG TABLET    Take 0.5 tablets (2.5 mg  total) by mouth daily.   METOPROLOL TARTRATE (LOPRESSOR) 25 MG TABLET    Take 0.5 tablets (12.5 mg total) by mouth daily.   MIRTAZAPINE (REMERON) 15 MG TABLET    Take 15 mg by mouth at bedtime.   PANTOPRAZOLE (PROTONIX) 40 MG TABLET    Take 40 mg by mouth daily.   SERTRALINE (ZOLOFT) 50 MG TABLET    Take 50 mg by mouth daily.   SODIUM PHOSPHATES (RA SALINE ENEMA) 19-7 GM/118ML ENEM    Place 1 each rectally as needed (for constipation).   TRAZODONE (DESYREL) 50 MG TABLET    Take 50 mg by mouth at bedtime.    UNABLE TO FIND    Med Name: Med Pass 120 mL twice daily  Modified Medications   No medications on file  Discontinued Medications   No medications on file     No Known Allergies   REVIEW OF SYSTEMS:  GENERAL: no change in appetite, no fatigue, no weight changes, no fever, chills or weakness EYES: +blurry  vision EARS: Denies change in hearing, ringing in ears, or earache NOSE: Denies nasal congestion or epistaxis MOUTH and THROAT: Denies oral discomfort, gingival pain or bleeding, pain from teeth or hoarseness   RESPIRATORY: no cough, SOB, DOE, wheezing, hemoptysis CARDIAC: no chest pain, edema or palpitations GI: no abdominal pain, diarrhea, constipation, heart burn, nausea or vomiting GU: Denies dysuria, frequency, hematuria, incontinence, or discharge PSYCHIATRIC: Denies feeling of depression or anxiety. No report of hallucinations, insomnia, paranoia, or agitation   PHYSICAL EXAMINATION  GENERAL APPEARANCE: Well nourished. In no acute distress. Normal body habitus SKIN:  Skin is warm and dry.  HEAD: Normal in size and contour. No evidence of trauma EYES: Lids open and close normally. No blepharitis, entropion or ectropion.  EARS: Pinnae are normal. Patient hears normal voice tunes of the examiner MOUTH and THROAT: Lips are without lesions. Oral mucosa is moist and without lesions. Tongue is normal in shape, size, and color and without lesions NECK: supple, trachea midline, no neck masses, no thyroid tenderness, no thyromegaly LYMPHATICS: no LAN in the neck, no supraclavicular LAN RESPIRATORY: breathing is even & unlabored, BS CTAB CARDIAC: RRR, no murmur,no extra heart sounds, no edema GI: abdomen soft, normal BS, no masses, no tenderness, no hepatomegaly, no splenomegaly EXTREMITIES:  Able to move X 4 extremities, ambulatory PSYCHIATRIC: Alert and oriented X 3. Affect and behavior are appropriate   LABS/RADIOLOGY: Labs reviewed: Basic Metabolic Panel:  Recent Labs  11/13/15 0129  11/14/15 0556  12/02/15 0459  01/23/16 0846 01/24/16 0343  01/26/16 1518 01/27/16 0424 01/28/16 0432 03/02/16 05/12/16  NA 139  < > 139  < > 137  137  < >  --  140  < > 136 139 138 140 140  K 3.6  < > 3.0*  < > 3.8  3.8  < >  --  3.6  < > 3.7 3.8 4.3 4.5 4.7  CL 106  --  109  < > 103  < >   --  101  < > 95* 93* 94*  --   --   CO2 25  --  24  < > 27  < >  --  29  < > 34* 36* 32  --   --   GLUCOSE 125*  --  86  < > 94  < >  --  86  < > 136* 92 79  --   --   BUN 13  < >  11  < > 8  8  < >  --  11  < > 17 17 18 18  22*  CREATININE 1.10*  < > 0.90  < > 0.96  1.0  < >  --  0.91  < > 0.98 1.03* 0.96 1.1 1.1  CALCIUM 7.3*  --  7.3*  < > 8.0*  < >  --  7.8*  < > 7.8* 8.3* 8.4*  --   --   MG 2.4  --  1.9  < > 1.8  --  1.8 2.0  --   --   --   --   --   --   PHOS 3.7  --  3.2  --   --   --  3.4  --   --   --   --   --   --   --   < > = values in this interval not displayed. Liver Function Tests:  Recent Labs  12/02/15 0459 12/10/15 1524 01/23/16 0846 05/12/16  AST 8* 8 12* 13  ALT 6* 6 10* 8  ALKPHOS 56 64 94 80  BILITOT 0.3 0.4 0.4  --   PROT 5.2* 6.8 4.7*  --   ALBUMIN 2.2* 3.1* 1.7*  --     Recent Labs  12/01/15 0047  AMMONIA 11   CBC:  Recent Labs  01/11/16 2145  01/23/16 0516  01/24/16 0343  01/25/16 0252  01/27/16 0424  01/28/16 0432 03/02/16 04/12/16  WBC 10.8*  < > 9.0  --  12.8*  --  9.4  --  4.3  --  4.6 5.8 5.4  NEUTROABS 9.3*  --  7.0  --  11.4*  --   --   --   --   --   --   --   --   HGB 9.6*  < > 8.6*  < > 9.4*  < > 8.7*  < > 8.8*  8.9*  < > 9.2* 9.9* 10.6*  HCT 30.5*  < > 28.6*  < > 30.2*  < > 28.4*  < > 29.0*  29.0*  < > 30.5* 31* 32*  MCV 93.3  < > 99.3  --  95.6  --  95.0  --  96.7  --  96.8  --   --   PLT 223  < > 177  --  204  --  187  --  207  --  227 192 157  < > = values in this interval not displayed. Lipid Panel:  Recent Labs  12/01/15 0644  HDL 27*   Cardiac Enzymes:  Recent Labs  01/13/16 0718 01/23/16 0846 01/23/16 1425  TROPONINI 0.06* <0.03 <0.03   CBG:  Recent Labs  11/30/15 1911 01/25/16 1601  GLUCAP 79 107*     ASSESSMENT/PLAN:  1. Blurry vision, bilateral - she said she lost her eyeglasses @ home or the hospital; will refer for opthalmology consult, keep room well-lit and clutter-free   2. Iron  deficiency anemia, unspecified iron deficiency anemia type - Continue ferrous sulfate 325 mg 1 tab by mouth twice a day, check CBC   3. Chronic obstructive pulmonary disease, unspecified COPD type ([place]) - no SOB; continue levalbuterol 125 mg/3 mL 1 vial via nebulizer every 6 hours when necessary and  twice a day routinely and [place] Ellipta 62.5 g INH inhale 1 puff by mouth daily   4. Polyneuropathy - stable, Continue gabapentin 600 mg 1 tab by mouth 3 times a  day and Norco 5-325 mg 1 tab by mouth every 12 hours when necessary   5. Insomnia, unspecified type - continue trazodone 50 mg 1 tab by mouth daily at bedtime   6. Depression, recurrent ([place]) - smiling throughout the visit, continue sertraline 50 mg 1 tab by mouth daily and Remeron 15 mg 1 tab by mouth daily at bedtime, followed up by [facility] NP   7. 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 [place] - NP    [employer] [phone]

## 2016-08-11 DIAGNOSIS — D649 Anemia, unspecified: Secondary | ICD-10-CM | POA: Diagnosis not present

## 2016-08-11 LAB — CBC AND DIFFERENTIAL
HCT: 30 — AB (ref 36–46)
HEMOGLOBIN: 10.2 — AB (ref 12.0–16.0)
Neutrophils Absolute: 2
PLATELETS: 165 (ref 150–399)
WBC: 4.4

## 2016-08-22 DIAGNOSIS — I10 Essential (primary) hypertension: Secondary | ICD-10-CM | POA: Diagnosis not present

## 2016-08-22 DIAGNOSIS — E039 Hypothyroidism, unspecified: Secondary | ICD-10-CM | POA: Diagnosis not present

## 2016-08-22 LAB — TSH: TSH: 10.13 — AB (ref 0.41–5.90)

## 2016-08-24 ENCOUNTER — Encounter: Payer: Self-pay | Admitting: Adult Health

## 2016-08-24 ENCOUNTER — Non-Acute Institutional Stay (SKILLED_NURSING_FACILITY): Payer: Medicare Other | Admitting: Adult Health

## 2016-08-24 DIAGNOSIS — R946 Abnormal results of thyroid function studies: Secondary | ICD-10-CM | POA: Diagnosis not present

## 2016-08-24 DIAGNOSIS — R7989 Other specified abnormal findings of blood chemistry: Secondary | ICD-10-CM

## 2016-08-24 NOTE — Progress Notes (Signed)
DATE:  08/24/2016   MRN:  [id number]  BIRTHDAY: [birth year]  Facility:  Nursing Home Location:  [facility] Room Number: 301-B  LEVEL OF CARE:  SNF (31)  Contact Information    Name Sheppton Daughter  [phone]       Code Status History    Date Active Date Inactive Code Status Order ID Comments User Context   01/23/2016  8:34 AM 01/29/2016 12:41 AM Full Code [id number]  Brenton Grills, PA-C ED   01/12/2016  1:28 AM 01/15/2016  7:21 PM Full Code [id number]  Edwin Dada, MD Inpatient   12/01/2015 12:37 AM 12/04/2015  6:44 PM Full Code [id number]  [name], MD Inpatient   11/10/2015  9:46 AM 11/20/2015 10:50 PM Full Code [id number]  Samella Parr, NP Inpatient       Chief Complaint  Patient presents with  . Acute Visit    Elevated TSH    HISTORY OF PRESENT ILLNESS:  This is a 33-YO female seen for an acute visit secondary to elevated TSH.  She is a long-term care resident of [facility]. She was seen in the room today. Latest tsh 10.13,  T4  4.8 and T3  68.9. She is currently on Methimazole 2.5 mg daily for hyperthyroidism. No complaints of constipation but verbalized feeling weak. She has a PMH of COPD, anemia, depression, and hyperthyroidism.    PAST MEDICAL HISTORY:  Past Medical History:  Diagnosis Date  . COPD (chronic obstructive pulmonary disease) ([place])   . History of anxiety   . History of depression   . Spinal stenosis   . UTI (urinary tract infection)      CURRENT MEDICATIONS: Reviewed  Patient's Medications  New Prescriptions   No medications on file  Previous Medications   BISACODYL (DULCOLAX) 10 MG SUPPOSITORY    Place 10 mg rectally as needed for moderate constipation.   DIGOXIN (LANOXIN) 0.125 MG TABLET    Take 1 tablet (0.125 mg total) by mouth daily.   DOCUSATE SODIUM (COLACE) 100 MG CAPSULE    Take 1 capsule (100 mg total)  by mouth 2 (two) times daily as needed for mild constipation.   FERROUS SULFATE 325 (65 FE) MG TABLET    Take 325 mg by mouth 2 (two) times daily after a meal.   FLECAINIDE (TAMBOCOR) 50 MG TABLET    Take 50 mg by mouth 2 (two) times daily.   GABAPENTIN (NEURONTIN) 600 MG TABLET    Take 600 mg by mouth 3 (three) times daily.   HYDROCODONE-ACETAMINOPHEN (NORCO/VICODIN) 5-325 MG TABLET    Take 1 tablet by mouth every 12 (twelve) hours as needed for severe pain.   INCRUSE ELLIPTA 62.5 MCG/INH AEPB    Inhale 1 puff into the lungs daily.    LEVALBUTEROL (XOPENEX) 0.63 MG/3ML NEBULIZER SOLUTION    Take 0.63 mg by nebulization 2 (two) times daily.   LEVALBUTEROL (XOPENEX) 1.25 MG/3ML NEBULIZER SOLUTION    Take 1.25 mg by nebulization every 6 (six) hours as needed for wheezing.   MAGNESIUM HYDROXIDE (MILK OF MAGNESIA) 400 MG/5ML SUSPENSION    Take 30 mLs by mouth daily as needed for mild constipation.   METHIMAZOLE (TAPAZOLE) 5 MG TABLET    Take 0.5 tablets (2.5 mg total) by mouth daily.   METOPROLOL TARTRATE (LOPRESSOR) 25 MG TABLET    Take 0.5 tablets (12.5 mg total) by mouth daily.   MIRTAZAPINE (  REMERON) 15 MG TABLET    Take 15 mg by mouth at bedtime.   PANTOPRAZOLE (PROTONIX) 40 MG TABLET    Take 40 mg by mouth daily.   SERTRALINE (ZOLOFT) 50 MG TABLET    Take 50 mg by mouth daily.   SODIUM PHOSPHATES (RA SALINE ENEMA) 19-7 GM/118ML ENEM    Place 1 each rectally as needed (for constipation).   TRAZODONE (DESYREL) 50 MG TABLET    Take 50 mg by mouth at bedtime.    UNABLE TO FIND    Med Name: Med Pass 120 mL twice daily  Modified Medications   No medications on file  Discontinued Medications   No medications on file     No Known Allergies   REVIEW OF SYSTEMS:  GENERAL: no change in appetite, no fatigue, no weight changes, no fever MOUTH and THROAT: Denies oral discomfort, gingival pain or bleeding, pain from teeth or hoarseness   RESPIRATORY: no cough, SOB, DOE, wheezing,  hemoptysis CARDIAC: no chest pain, edema or palpitations GI: no abdominal pain, diarrhea, constipation, heart burn, nausea or vomiting GU: Denies dysuria, frequency, hematuria, incontinence, or discharge PSYCHIATRIC: Denies feeling of depression or anxiety. No report of hallucinations, insomnia, paranoia, or agitation    PHYSICAL EXAMINATION  GENERAL APPEARANCE: Well nourished. In no acute distress. Normal body habitus SKIN:  Skin is warm and dry.  HEAD: Normal in size and contour. No evidence of trauma EYES: Lids open and close normally. No blepharitis, entropion or ectropion.  EARS: Pinnae are normal. Patient hears normal voice tunes of the examiner MOUTH and THROAT: Lips are without lesions. Oral mucosa is moist and without lesions. Tongue is normal in shape, size, and color and without lesions RESPIRATORY: breathing is even & unlabored, BS CTAB CARDIAC: RRR, no murmur,no extra heart sounds, no edema GI: abdomen soft, normal BS, no masses, no tenderness, no hepatomegaly, no splenomegaly EXTREMITIES:  Able to move X 4 extremities, ambulates with walker PSYCHIATRIC: Alert and oriented X 3. Affect and behavior are appropriate   LABS/RADIOLOGY: Labs reviewed: Basic Metabolic Panel:  Recent Labs  11/13/15 0129  11/14/15 0556  12/02/15 0459  01/23/16 0846 01/24/16 0343  01/26/16 1518 01/27/16 0424 01/28/16 0432 03/02/16 05/12/16  NA 139  < > 139  < > 137  137  < >  --  140  < > 136 139 138 140 140  K 3.6  < > 3.0*  < > 3.8  3.8  < >  --  3.6  < > 3.7 3.8 4.3 4.5 4.7  CL 106  --  109  < > 103  < >  --  101  < > 95* 93* 94*  --   --   CO2 25  --  24  < > 27  < >  --  29  < > 34* 36* 32  --   --   GLUCOSE 125*  --  86  < > 94  < >  --  86  < > 136* 92 79  --   --   BUN 13  < > 11  < > 8  8  < >  --  11  < > 17 17 18 18  22*  CREATININE 1.10*  < > 0.90  < > 0.96  1.0  < >  --  0.91  < > 0.98 1.03* 0.96 1.1 1.1  CALCIUM 7.3*  --  7.3*  < > 8.0*  < >  --  7.8*  < >  7.8* 8.3*  8.4*  --   --   MG 2.4  --  1.9  < > 1.8  --  1.8 2.0  --   --   --   --   --   --   PHOS 3.7  --  3.2  --   --   --  3.4  --   --   --   --   --   --   --   < > = values in this interval not displayed. Liver Function Tests:  Recent Labs  12/02/15 0459 12/10/15 1524 01/23/16 0846 05/12/16  AST 8* 8 12* 13  ALT 6* 6 10* 8  ALKPHOS 56 64 94 80  BILITOT 0.3 0.4 0.4  --   PROT 5.2* 6.8 4.7*  --   ALBUMIN 2.2* 3.1* 1.7*  --     Recent Labs  12/01/15 0047  AMMONIA 11   CBC:  Recent Labs  01/23/16 0516  01/24/16 0343  01/25/16 0252  01/27/16 0424  01/28/16 0432 03/02/16 04/12/16 08/11/16  WBC 9.0  --  12.8*  --  9.4  --  4.3  --  4.6 5.8 5.4 4.4  NEUTROABS 7.0  --  11.4*  --   --   --   --   --   --   --   --  2  HGB 8.6*  < > 9.4*  < > 8.7*  < > 8.8*  8.9*  < > 9.2* 9.9* 10.6* 10.2*  HCT 28.6*  < > 30.2*  < > 28.4*  < > 29.0*  29.0*  < > 30.5* 31* 32* 30*  MCV 99.3  --  95.6  --  95.0  --  96.7  --  96.8  --   --   --   PLT 177  --  204  --  187  --  207  --  227 192 157 165  < > = values in this interval not displayed. Lipid Panel:  Recent Labs  12/01/15 0644  HDL 27*   Cardiac Enzymes:  Recent Labs  01/13/16 0718 01/23/16 0846 01/23/16 1425  TROPONINI 0.06* <0.03 <0.03   CBG:  Recent Labs  11/30/15 1911 01/25/16 1601  GLUCAP 79 107*     ASSESSMENT/PLAN:  1. Elevated TSH - tsh 10.13 (elevated), T4 4.8 (low) and T4 4.8 (low), discontinue Methimazole, repeat tsh, free T4 and T3 in 1 month     Monina C. [place] - NP    [employer] [phone]

## 2016-08-30 DIAGNOSIS — H40013 Open angle with borderline findings, low risk, bilateral: Secondary | ICD-10-CM | POA: Diagnosis not present

## 2016-08-30 DIAGNOSIS — I5033 Acute on chronic diastolic (congestive) heart failure: Secondary | ICD-10-CM | POA: Diagnosis not present

## 2016-08-30 DIAGNOSIS — H43813 Vitreous degeneration, bilateral: Secondary | ICD-10-CM | POA: Diagnosis not present

## 2016-08-30 DIAGNOSIS — H25813 Combined forms of age-related cataract, bilateral: Secondary | ICD-10-CM | POA: Diagnosis not present

## 2016-08-30 DIAGNOSIS — M25511 Pain in right shoulder: Secondary | ICD-10-CM | POA: Diagnosis not present

## 2016-08-31 DIAGNOSIS — M25511 Pain in right shoulder: Secondary | ICD-10-CM | POA: Diagnosis not present

## 2016-08-31 DIAGNOSIS — I5033 Acute on chronic diastolic (congestive) heart failure: Secondary | ICD-10-CM | POA: Diagnosis not present

## 2016-09-01 DIAGNOSIS — I5033 Acute on chronic diastolic (congestive) heart failure: Secondary | ICD-10-CM | POA: Diagnosis not present

## 2016-09-01 DIAGNOSIS — M25511 Pain in right shoulder: Secondary | ICD-10-CM | POA: Diagnosis not present

## 2016-09-02 DIAGNOSIS — I5033 Acute on chronic diastolic (congestive) heart failure: Secondary | ICD-10-CM | POA: Diagnosis not present

## 2016-09-02 DIAGNOSIS — M25511 Pain in right shoulder: Secondary | ICD-10-CM | POA: Diagnosis not present

## 2016-09-05 ENCOUNTER — Other Ambulatory Visit: Payer: Self-pay

## 2016-09-05 DIAGNOSIS — I5033 Acute on chronic diastolic (congestive) heart failure: Secondary | ICD-10-CM | POA: Diagnosis not present

## 2016-09-05 DIAGNOSIS — M25511 Pain in right shoulder: Secondary | ICD-10-CM | POA: Diagnosis not present

## 2016-09-05 MED ORDER — HYDROCODONE-ACETAMINOPHEN 5-325 MG PO TABS: 1.0000 | ORAL_TABLET | Freq: Two times a day (BID) | ORAL | 0 refills | Status: DC | PRN

## 2016-09-05 NOTE — Telephone Encounter (Signed)
Prescription faxed to [facility]  [phone]

## 2016-09-06 DIAGNOSIS — I5033 Acute on chronic diastolic (congestive) heart failure: Secondary | ICD-10-CM | POA: Diagnosis not present

## 2016-09-06 DIAGNOSIS — Z8739 Personal history of other diseases of the musculoskeletal system and connective tissue: Secondary | ICD-10-CM | POA: Diagnosis not present

## 2016-09-06 DIAGNOSIS — R296 Repeated falls: Secondary | ICD-10-CM | POA: Diagnosis not present

## 2016-09-06 DIAGNOSIS — H6123 Impacted cerumen, bilateral: Secondary | ICD-10-CM | POA: Diagnosis not present

## 2016-09-06 DIAGNOSIS — M25511 Pain in right shoulder: Secondary | ICD-10-CM | POA: Diagnosis not present

## 2016-09-06 DIAGNOSIS — Z993 Dependence on wheelchair: Secondary | ICD-10-CM | POA: Diagnosis not present

## 2016-09-07 DIAGNOSIS — I5033 Acute on chronic diastolic (congestive) heart failure: Secondary | ICD-10-CM | POA: Diagnosis not present

## 2016-09-07 DIAGNOSIS — M25511 Pain in right shoulder: Secondary | ICD-10-CM | POA: Diagnosis not present

## 2016-09-08 DIAGNOSIS — H2512 Age-related nuclear cataract, left eye: Secondary | ICD-10-CM | POA: Diagnosis not present

## 2016-09-08 DIAGNOSIS — H268 Other specified cataract: Secondary | ICD-10-CM | POA: Diagnosis not present

## 2016-09-09 DIAGNOSIS — I5033 Acute on chronic diastolic (congestive) heart failure: Secondary | ICD-10-CM | POA: Diagnosis not present

## 2016-09-09 DIAGNOSIS — M25511 Pain in right shoulder: Secondary | ICD-10-CM | POA: Diagnosis not present

## 2016-09-10 DIAGNOSIS — M25511 Pain in right shoulder: Secondary | ICD-10-CM | POA: Diagnosis not present

## 2016-09-10 DIAGNOSIS — J449 Chronic obstructive pulmonary disease, unspecified: Secondary | ICD-10-CM | POA: Diagnosis not present

## 2016-09-10 DIAGNOSIS — R1311 Dysphagia, oral phase: Secondary | ICD-10-CM | POA: Diagnosis not present

## 2016-09-10 DIAGNOSIS — I5033 Acute on chronic diastolic (congestive) heart failure: Secondary | ICD-10-CM | POA: Diagnosis not present

## 2016-09-12 DIAGNOSIS — J449 Chronic obstructive pulmonary disease, unspecified: Secondary | ICD-10-CM | POA: Diagnosis not present

## 2016-09-12 DIAGNOSIS — R1311 Dysphagia, oral phase: Secondary | ICD-10-CM | POA: Diagnosis not present

## 2016-09-12 DIAGNOSIS — M25511 Pain in right shoulder: Secondary | ICD-10-CM | POA: Diagnosis not present

## 2016-09-12 DIAGNOSIS — I5033 Acute on chronic diastolic (congestive) heart failure: Secondary | ICD-10-CM | POA: Diagnosis not present

## 2016-09-14 DIAGNOSIS — F339 Major depressive disorder, recurrent, unspecified: Secondary | ICD-10-CM | POA: Diagnosis not present

## 2016-09-14 DIAGNOSIS — G4700 Insomnia, unspecified: Secondary | ICD-10-CM | POA: Diagnosis not present

## 2016-09-16 ENCOUNTER — Non-Acute Institutional Stay (SKILLED_NURSING_FACILITY): Payer: Medicare Other | Admitting: Adult Health

## 2016-09-16 ENCOUNTER — Encounter: Payer: Self-pay | Admitting: Adult Health

## 2016-09-16 DIAGNOSIS — G4700 Insomnia, unspecified: Secondary | ICD-10-CM

## 2016-09-16 DIAGNOSIS — G894 Chronic pain syndrome: Secondary | ICD-10-CM

## 2016-09-16 DIAGNOSIS — Z9842 Cataract extraction status, left eye: Secondary | ICD-10-CM

## 2016-09-16 DIAGNOSIS — F339 Major depressive disorder, recurrent, unspecified: Secondary | ICD-10-CM | POA: Diagnosis not present

## 2016-09-16 DIAGNOSIS — J449 Chronic obstructive pulmonary disease, unspecified: Secondary | ICD-10-CM | POA: Diagnosis not present

## 2016-09-16 DIAGNOSIS — N183 Chronic kidney disease, stage 3 unspecified: Secondary | ICD-10-CM

## 2016-09-16 DIAGNOSIS — I471 Supraventricular tachycardia: Secondary | ICD-10-CM

## 2016-09-16 DIAGNOSIS — Z8719 Personal history of other diseases of the digestive system: Secondary | ICD-10-CM | POA: Diagnosis not present

## 2016-09-16 DIAGNOSIS — R946 Abnormal results of thyroid function studies: Secondary | ICD-10-CM | POA: Diagnosis not present

## 2016-09-16 DIAGNOSIS — D638 Anemia in other chronic diseases classified elsewhere: Secondary | ICD-10-CM | POA: Diagnosis not present

## 2016-09-16 DIAGNOSIS — R7989 Other specified abnormal findings of blood chemistry: Secondary | ICD-10-CM

## 2016-09-16 NOTE — Progress Notes (Signed)
DATE:  09/16/2016   MRN:  [id number]  BIRTHDAY: 10-06-1940  Facility:  Nursing Home Location:  [facility] [place] Room Number: 301-B  LEVEL OF CARE:  SNF (31)  Contact Information    Name Dawn [name]  [phone]       Code Status History    Date Active Date Inactive Code Status Order ID Comments User Context   01/23/2016  8:34 AM 01/29/2016 12:41 AM Full Code [id number]  Dawn Grills, PA-C ED   01/12/2016  1:28 AM 01/15/2016  7:21 PM Full Code [id number]  Dawn Dada, MD Inpatient   12/01/2015 12:37 AM 12/04/2015  6:44 PM Full Code [id number]  Dawn Patience, MD Inpatient   11/10/2015  9:46 AM 11/20/2015 10:50 PM Full Code [id number]  Dawn Parr, NP Inpatient       Chief Complaint  Patient presents with  . Medical Management of Chronic Issues    Routine visit    HISTORY OF PRESENT ILLNESS:  This is a 89-YO female seen for a routine visit.  She is a long-term care resident of [facility]. She has a PMH of COPD, anemia, hyperthyroidism, and depression. She had a recent cataract surgery on her left eye and will have another one next week for her right eye. She was seen in her room today. She denies left eye pain. No erythema noted. Trazodone was recently increased from 50 mg to 100 mg Q HS.       PAST MEDICAL HISTORY:  Past Medical History:  Diagnosis Date  . COPD (chronic obstructive pulmonary disease) ([place])   . History of anxiety   . History of depression   . Spinal stenosis   . UTI (urinary tract infection)      CURRENT MEDICATIONS: Reviewed  Patient's Medications  New Prescriptions   No medications on file  Previous Medications   BISACODYL (DULCOLAX) 10 MG SUPPOSITORY    Place 10 mg rectally as needed for moderate constipation.   DIGOXIN (LANOXIN) 0.125 MG TABLET    Take 1 tablet (0.125 mg total) by mouth daily.   DOCUSATE SODIUM (COLACE)  100 MG CAPSULE    Take 1 capsule (100 mg total) by mouth 2 (two) times daily as needed for mild constipation.   EYELID CLEANSERS (OCUSOFT LID SCRUB EX)    Apply topically daily. Perform on the morning of 9/12 and 9/13.   FERROUS SULFATE 325 (65 FE) MG TABLET    Take 325 mg by mouth 2 (two) times daily after a meal.   FLECAINIDE (TAMBOCOR) 50 MG TABLET    Take 50 mg by mouth 2 (two) times daily.   GABAPENTIN (NEURONTIN) 600 MG TABLET    Take 600 mg by mouth 3 (three) times daily.   HYDROCODONE-ACETAMINOPHEN (NORCO/VICODIN) 5-325 MG TABLET    Take 1 tablet by mouth every 12 (twelve) hours as needed for severe pain.   INCRUSE ELLIPTA 62.5 MCG/INH AEPB    Inhale 1 puff into the lungs daily.    KETOROLAC (ACULAR) 0.4 % SOLN    1 drop 4 (four) times daily. Apply to left eye   KETOROLAC (ACULAR) 0.4 % SOLN    Place 1 drop into the right eye 4 (four) times daily. Apply to right eye QID on 9/12 and 9/13.  Apply to right eye QID - stop 9/27.   LEVALBUTEROL (XOPENEX) 0.63 MG/3ML NEBULIZER SOLUTION    Take 0.63 mg by  nebulization 2 (two) times daily.   LEVALBUTEROL (XOPENEX) 1.25 MG/3ML NEBULIZER SOLUTION    Take 1.25 mg by nebulization every 6 (six) hours as needed for wheezing.   MAGNESIUM HYDROXIDE (MILK OF MAGNESIA) 400 MG/5ML SUSPENSION    Take 30 mLs by mouth daily as needed for mild constipation.   METOPROLOL TARTRATE (LOPRESSOR) 25 MG TABLET    Take 0.5 tablets (12.5 mg total) by mouth daily.   MIRTAZAPINE (REMERON) 15 MG TABLET    Take 15 mg by mouth at bedtime.   OFLOXACIN (OCUFLOX) 0.3 % OPHTHALMIC SOLUTION    1 drop 4 (four) times daily. 1 drop QID until 9/7, 1 drop into right eye QID on 9/12 and 9/13, 1 drop QID until 9/20   PANTOPRAZOLE (PROTONIX) 40 MG TABLET    Take 40 mg by mouth daily.   PREDNISOLONE ACETATE (PREDNISOLONE ACETATE P-F) 1 % OPHTHALMIC SUSPENSION    Place 1 drop into the left eye. 1 drop TID (stop 9/14), 1 drop BID (stop 09/30/16), 1 drop QD (stop 10/08/16)   SERTRALINE (ZOLOFT) 50  MG TABLET    Take 50 mg by mouth daily.   SODIUM PHOSPHATES (RA SALINE ENEMA) 19-7 GM/118ML ENEM    Place 1 each rectally as needed (for constipation).   TRAZODONE (DESYREL) 100 MG TABLET    Take 100 mg by mouth 2 (two) times daily.   UNABLE TO FIND    Med Name: Med Pass 120 mL twice daily  Modified Medications   No medications on file  Discontinued Medications   METHIMAZOLE (TAPAZOLE) 5 MG TABLET    Take 0.5 tablets (2.5 mg total) by mouth daily.   TRAZODONE (DESYREL) 50 MG TABLET    Take 50 mg by mouth at bedtime.      No Known Allergies   REVIEW OF SYSTEMS:  GENERAL: no change in appetite, no fatigue, no weight changes, no fever, chills or weakness MOUTH and THROAT: Denies oral discomfort, gingival pain or bleeding, pain from teeth or hoarseness   RESPIRATORY: no cough, SOB, DOE, wheezing, hemoptysis CARDIAC: no chest pain, edema or palpitations GI: no abdominal pain, diarrhea, constipation, heart burn, nausea or vomiting GU: Denies dysuria, frequency, hematuria, incontinence, or discharge PSYCHIATRIC: Denies feeling of depression or anxiety. No report of hallucinations, insomnia, paranoia, or agitation   PHYSICAL EXAMINATION  GENERAL APPEARANCE:  In no acute distress.  SKIN:  Skin is warm and dry.  HEAD: Normal in size and contour. No evidence of trauma EYES: Lids open and close normally. No erythema  MOUTH and THROAT: Lips are without lesions. Oral mucosa is moist and without lesions. Tongue is normal in shape, size, and color and without lesions RESPIRATORY: breathing is even & unlabored, BS CTAB CARDIAC: RRR, no murmur,no extra heart sounds, no edema GI: abdomen soft, normal BS, no masses, no tenderness, no hepatomegaly, no splenomegaly EXTREMITIES:  Able to move X 4 extremities, walks with walker PSYCHIATRIC: Alert and oriented X 3. Affect and behavior are appropriate   LABS/RADIOLOGY: Labs reviewed: Basic Metabolic Panel:  Recent Labs  11/13/15 0129   11/14/15 0556  12/02/15 0459  01/23/16 0846 01/24/16 0343  01/26/16 1518 01/27/16 0424 01/28/16 0432 03/02/16 05/12/16  NA 139  < > 139  < > 137  137  < >  --  140  < > 136 139 138 140 140  K 3.6  < > 3.0*  < > 3.8  3.8  < >  --  3.6  < > 3.7 3.8 4.3  4.5 4.7  CL 106  --  109  < > 103  < >  --  101  < > 95* 93* 94*  --   --   CO2 25  --  24  < > 27  < >  --  29  < > 34* 36* 32  --   --   GLUCOSE 125*  --  86  < > 94  < >  --  86  < > 136* 92 79  --   --   BUN 13  < > 11  < > 8  8  < >  --  11  < > 17 17 18 18  22*  CREATININE 1.10*  < > 0.90  < > 0.96  1.0  < >  --  0.91  < > 0.98 1.03* 0.96 1.1 1.1  CALCIUM 7.3*  --  7.3*  < > 8.0*  < >  --  7.8*  < > 7.8* 8.3* 8.4*  --   --   MG 2.4  --  1.9  < > 1.8  --  1.8 2.0  --   --   --   --   --   --   PHOS 3.7  --  3.2  --   --   --  3.4  --   --   --   --   --   --   --   < > = values in this interval not displayed. Liver Function Tests:  Recent Labs  12/02/15 0459 12/10/15 1524 01/23/16 0846 05/12/16  AST 8* 8 12* 13  ALT 6* 6 10* 8  ALKPHOS 56 64 94 80  BILITOT 0.3 0.4 0.4  --   PROT 5.2* 6.8 4.7*  --   ALBUMIN 2.2* 3.1* 1.7*  --     Recent Labs  12/01/15 0047  AMMONIA 11   CBC:  Recent Labs  01/23/16 0516  01/24/16 0343  01/25/16 0252  01/27/16 0424  01/28/16 0432 03/02/16 04/12/16 08/11/16  WBC 9.0  --  12.8*  --  9.4  --  4.3  --  4.6 5.8 5.4 4.4  NEUTROABS 7.0  --  11.4*  --   --   --   --   --   --   --   --  2  HGB 8.6*  < > 9.4*  < > 8.7*  < > 8.8*  8.9*  < > 9.2* 9.9* 10.6* 10.2*  HCT 28.6*  < > 30.2*  < > 28.4*  < > 29.0*  29.0*  < > 30.5* 31* 32* 30*  MCV 99.3  --  95.6  --  95.0  --  96.7  --  96.8  --   --   --   PLT 177  --  204  --  187  --  207  --  227 192 157 165  < > = values in this interval not displayed. Lipid Panel:  Recent Labs  12/01/15 0644  HDL 27*   Cardiac Enzymes:  Recent Labs  01/13/16 0718 01/23/16 0846 01/23/16 1425  TROPONINI 0.06* <0.03 <0.03   CBG:  Recent  Labs  11/30/15 1911 01/25/16 1601  GLUCAP 79 107*     ASSESSMENT/PLAN:  1. Chronic pain syndrome - pain is well controlled; continue Norco 5-325 mg 1 tab every 12 hours when necessary   2. Elevated TSH -  Tapazole was recently discontinued and for repeat tsh,  free-T4 and T3 Lab Results  Component Value Date   TSH 10.13 (A) 08/22/2016     3. Insomnia, unspecified type - recently increased trazodone from 50 mg to 100 mg Q HS   4. Chronic obstructive pulmonary disease, unspecified COPD type ([place]) - no SOB nor wheezing, continue levalbuterol via nebulizer twice a day routine and BID PRN, [place]  Ellipta inhale 1 puff daily   5. 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 CKD (chronic kidney disease) stage 3, GFR 30-59 ml/min - check BMP Lab Results  Component Value Date   CREATININE 1.1 05/12/2016     8. Anemia of chronic disease - stable, decrease FeSO4 325 ng from BID to daily Lab Results  Component Value Date   HGB 10.2 (A) 08/11/2016     9. S/P cataract surgery, left  - continue opthalmic drops and follow-up with Dr. Katy Fitch, opthamologist   10.  Depression  -  Continue sertraline 50 mg 1 tab daily and Remeron 15 mg 1 tab daily at bedtime, followed up by [facility] NP     Goals of care:  Long-term care    Kyleigh Nannini C. [place] - NP    [employer] [phone]

## 2016-09-17 DIAGNOSIS — H2511 Age-related nuclear cataract, right eye: Secondary | ICD-10-CM | POA: Diagnosis not present

## 2016-09-17 DIAGNOSIS — D649 Anemia, unspecified: Secondary | ICD-10-CM | POA: Diagnosis not present

## 2016-09-17 DIAGNOSIS — I4891 Unspecified atrial fibrillation: Secondary | ICD-10-CM | POA: Diagnosis not present

## 2016-09-17 DIAGNOSIS — D509 Iron deficiency anemia, unspecified: Secondary | ICD-10-CM | POA: Diagnosis not present

## 2016-09-17 DIAGNOSIS — J449 Chronic obstructive pulmonary disease, unspecified: Secondary | ICD-10-CM | POA: Diagnosis not present

## 2016-09-17 DIAGNOSIS — G894 Chronic pain syndrome: Secondary | ICD-10-CM | POA: Diagnosis not present

## 2016-09-17 LAB — BASIC METABOLIC PANEL
BUN: 15 (ref 4–21)
Creatinine: 1.2 — AB (ref 0.5–1.1)
Glucose: 91
Potassium: 4.7 (ref 3.4–5.3)
Sodium: 139 (ref 137–147)

## 2016-09-22 DIAGNOSIS — H2511 Age-related nuclear cataract, right eye: Secondary | ICD-10-CM | POA: Diagnosis not present

## 2016-09-29 DIAGNOSIS — J449 Chronic obstructive pulmonary disease, unspecified: Secondary | ICD-10-CM | POA: Diagnosis not present

## 2016-09-29 DIAGNOSIS — E039 Hypothyroidism, unspecified: Secondary | ICD-10-CM | POA: Diagnosis not present

## 2016-09-29 DIAGNOSIS — E0590 Thyrotoxicosis, unspecified without thyrotoxic crisis or storm: Secondary | ICD-10-CM | POA: Diagnosis not present

## 2016-09-29 DIAGNOSIS — R1311 Dysphagia, oral phase: Secondary | ICD-10-CM | POA: Diagnosis not present

## 2016-09-29 DIAGNOSIS — I5033 Acute on chronic diastolic (congestive) heart failure: Secondary | ICD-10-CM | POA: Diagnosis not present

## 2016-09-29 DIAGNOSIS — M25511 Pain in right shoulder: Secondary | ICD-10-CM | POA: Diagnosis not present

## 2016-09-30 DIAGNOSIS — I5033 Acute on chronic diastolic (congestive) heart failure: Secondary | ICD-10-CM | POA: Diagnosis not present

## 2016-09-30 DIAGNOSIS — J449 Chronic obstructive pulmonary disease, unspecified: Secondary | ICD-10-CM | POA: Diagnosis not present

## 2016-09-30 DIAGNOSIS — R1311 Dysphagia, oral phase: Secondary | ICD-10-CM | POA: Diagnosis not present

## 2016-09-30 DIAGNOSIS — M25511 Pain in right shoulder: Secondary | ICD-10-CM | POA: Diagnosis not present

## 2016-10-02 ENCOUNTER — Encounter (HOSPITAL_COMMUNITY): Payer: Self-pay | Admitting: *Deleted

## 2016-10-02 ENCOUNTER — Emergency Department (HOSPITAL_COMMUNITY): Payer: Medicare Other

## 2016-10-02 ENCOUNTER — Emergency Department (HOSPITAL_COMMUNITY)
Admission: EM | Admit: 2016-10-02 | Discharge: 2016-10-02 | Disposition: A | Discharge status: Skilled Nursing Facility | Payer: Medicare Other | Source: Home / Self Care | Attending: Emergency Medicine | Admitting: Emergency Medicine

## 2016-10-02 DIAGNOSIS — S8002XA Contusion of left knee, initial encounter: Secondary | ICD-10-CM | POA: Diagnosis not present

## 2016-10-02 DIAGNOSIS — S92909A Unspecified fracture of unspecified foot, initial encounter for closed fracture: Secondary | ICD-10-CM | POA: Diagnosis not present

## 2016-10-02 DIAGNOSIS — Y92129 Unspecified place in nursing home as the place of occurrence of the external cause: Secondary | ICD-10-CM

## 2016-10-02 DIAGNOSIS — S99912A Unspecified injury of left ankle, initial encounter: Secondary | ICD-10-CM | POA: Diagnosis not present

## 2016-10-02 DIAGNOSIS — W19XXXA Unspecified fall, initial encounter: Secondary | ICD-10-CM | POA: Diagnosis not present

## 2016-10-02 DIAGNOSIS — R600 Localized edema: Secondary | ICD-10-CM | POA: Diagnosis not present

## 2016-10-02 DIAGNOSIS — S82842A Displaced bimalleolar fracture of left lower leg, initial encounter for closed fracture: Secondary | ICD-10-CM | POA: Insufficient documentation

## 2016-10-02 DIAGNOSIS — Y999 Unspecified external cause status: Secondary | ICD-10-CM

## 2016-10-02 DIAGNOSIS — W1830XA Fall on same level, unspecified, initial encounter: Secondary | ICD-10-CM | POA: Insufficient documentation

## 2016-10-02 DIAGNOSIS — Y939 Activity, unspecified: Secondary | ICD-10-CM | POA: Insufficient documentation

## 2016-10-02 DIAGNOSIS — N185 Chronic kidney disease, stage 5: Secondary | ICD-10-CM

## 2016-10-02 DIAGNOSIS — M25572 Pain in left ankle and joints of left foot: Secondary | ICD-10-CM | POA: Diagnosis not present

## 2016-10-02 DIAGNOSIS — R9431 Abnormal electrocardiogram [ECG] [EKG]: Secondary | ICD-10-CM | POA: Diagnosis not present

## 2016-10-02 DIAGNOSIS — J449 Chronic obstructive pulmonary disease, unspecified: Secondary | ICD-10-CM

## 2016-10-02 DIAGNOSIS — T148XXA Other injury of unspecified body region, initial encounter: Secondary | ICD-10-CM | POA: Diagnosis not present

## 2016-10-02 DIAGNOSIS — S82845A Nondisplaced bimalleolar fracture of left lower leg, initial encounter for closed fracture: Secondary | ICD-10-CM | POA: Diagnosis not present

## 2016-10-02 DIAGNOSIS — I120 Hypertensive chronic kidney disease with stage 5 chronic kidney disease or end stage renal disease: Secondary | ICD-10-CM | POA: Insufficient documentation

## 2016-10-02 MED ORDER — HYDROCODONE-ACETAMINOPHEN 5-325 MG PO TABS
1.0000 | ORAL_TABLET | Freq: Once | ORAL | Status: AC
  Administered 2016-10-02: 1 via ORAL
  Filled 2016-10-02: qty 1

## 2016-10-02 NOTE — ED Notes (Signed)
Ortho tech called 

## 2016-10-02 NOTE — ED Provider Notes (Signed)
[place] DEPT Provider Note   CSN: [id number] Arrival date & time: 10/02/16  1203     History   Chief Complaint Chief Complaint  Patient presents with  . Ankle Pain    fracture left ankle     HPI Dawn [name] is a 76 y.o. female.  HPI Patient with fall. Twisted left ankle. Sent from nursing home with reported fracture on x-ray however x-ray report or imaging not sent with the patient. Denies other pain. No hip pain. Did not hit her head. She states she did have lunch today. Past Medical History:  Diagnosis Date  . COPD (chronic obstructive pulmonary disease) ([place])   . History of anxiety   . History of depression   . Spinal stenosis   . UTI (urinary tract infection)     Patient Active Problem List   Diagnosis Date Noted  . Recurrent falls 02/02/2016  . 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 Upper GI bleed 01/23/2016  . Blood loss anemia 01/23/2016  . Acute diastolic CHF (congestive heart failure) ([place]) 01/23/2016  . Heme + stool   . Abnormal CT scan, colon   . Paroxysmal junctional tachycardia ([place])   . Hypoxia   . At risk for adverse drug event 01/18/2016  . Subcapital fracture of right hip, closed 01/12/2016  . History of depression 12/12/2015  . Alteration consciousness 11/30/2015  . SVT (supraventricular tachycardia) ([place]) 11/27/2015  . Diarrhea 11/27/2015  . Depression 11/27/2015  . Polyneuropathy 11/27/2015  . Renal insufficiency   . Protein-calorie malnutrition, severe 11/18/2015  . Intra-abdominal fluid collection   . Premature atrial contractions   . Functional diarrhea   . Sepsis ([place])   . Hyperthyroidism   . Hypotension   . Septic shock ([place])   . Acute encephalopathy   . Hypokalemia   . Acute delirium 11/10/2015  . Acute kidney injury ([place]) 11/10/2015  . Anemia, iron deficiency 11/10/2015  . Anxiety 11/10/2015  . Chronic pain syndrome 11/10/2015  . Recurrent UTI 11/10/2015  . Acute respiratory failure with hypoxia ([place])  11/10/2015  . COPD (chronic obstructive pulmonary disease) ([place]) 11/10/2015  . Acute hyperglycemia 11/10/2015  . CKD (chronic kidney disease) stage 3, GFR 30-59 ml/min 11/10/2015  . Narrow complex tachycardia ([place]) 11/10/2015  . HTN (hypertension) 11/10/2015  . Generalized weakness 11/10/2015  . Anemia   . Delirium   . Hypoxemia   . Tachycardia     Past Surgical History:  Procedure Laterality Date  . FLEXIBLE SIGMOIDOSCOPY Left 11/18/2015   Procedure: FLEXIBLE SIGMOIDOSCOPY;  Surgeon: [name], MD;  Location: [facility];  Service: Endoscopy;  Laterality: Left;  . FLEXIBLE SIGMOIDOSCOPY N/A 11/20/2015   Procedure: FLEXIBLE SIGMOIDOSCOPY;  Surgeon: [name], MD;  Location: [facility] ENDOSCOPY;  Service: Endoscopy;  Laterality: N/A;  . HIP ARTHROPLASTY Right 01/12/2016   Procedure: ARTHROPLASTY BIPOLAR HIP (HEMIARTHROPLASTY);  Surgeon: [name], MD;  Location: [facility];  Service: Orthopedics;  Laterality: Right;    OB History    No data available       Home Medications    Prior to Admission medications   Medication Sig Start Date End Date Taking? Authorizing Provider  digoxin (LANOXIN) 0.125 MG tablet Take 1 tablet (0.125 mg total) by mouth daily. 01/29/16  Yes Regalado, Belkys A, MD  HYDROcodone-acetaminophen (NORCO/VICODIN) 5-325 MG tablet Take 1 tablet by mouth every 12 (twelve) hours as needed for severe pain. 09/05/16  Yes Medina-Vargas, Monina C, NP  bisacodyl (DULCOLAX) 10 MG suppository Place 10 mg rectally as needed  for moderate constipation.    [provider]  docusate sodium (COLACE) 100 MG capsule Take 1 capsule (100 mg total) by mouth 2 (two) times daily as needed for mild constipation. 01/28/16   Regalado, Belkys A, MD  ferrous sulfate 325 (65 FE) MG tablet Take 325 mg by mouth 2 (two) times daily after a meal.    [provider]  flecainide (TAMBOCOR) 50 MG tablet Take 50 mg by mouth 2 (two) times daily.    [provider]  gabapentin  (NEURONTIN) 600 MG tablet Take 600 mg by mouth 3 (three) times daily.    [provider]  INCRUSE ELLIPTA 62.5 MCG/INH AEPB Inhale 1 puff into the lungs daily.  10/19/15   [provider]  ketorolac (ACULAR) 0.4 % SOLN Place 1 drop into the right eye 4 (four) times daily. Apply to right eye QID on 9/12 and 9/13.  Apply to right eye QID - stop 9/27.    [provider]  levalbuterol Penne Lash) 0.63 MG/3ML nebulizer solution Take 0.63 mg by nebulization 2 (two) times daily.    [provider]  levalbuterol Penne Lash) 1.25 MG/3ML nebulizer solution Take 1.25 mg by nebulization every 6 (six) hours as needed for wheezing.    [provider]  magnesium hydroxide (MILK OF MAGNESIA) 400 MG/5ML suspension Take 30 mLs by mouth daily as needed for mild constipation.    [provider]  metoprolol tartrate (LOPRESSOR) 25 MG tablet Take 0.5 tablets (12.5 mg total) by mouth daily. 05/11/16   Lauree Chandler, NP  mirtazapine (REMERON) 15 MG tablet Take 15 mg by mouth at bedtime.    [provider]  ofloxacin (OCUFLOX) 0.3 % ophthalmic solution 1 drop 4 (four) times daily. 1 drop QID until 9/7, 1 drop into right eye QID on 9/12 and 9/13, 1 drop QID until 9/20 09/09/16   [provider]  pantoprazole (PROTONIX) 40 MG tablet Take 40 mg by mouth daily.    [provider]  prednisoLONE acetate (PREDNISOLONE ACETATE P-F) 1 % ophthalmic suspension Place 1 drop into the left eye. 1 drop TID (stop 9/14), 1 drop BID (stop 09/30/16), 1 drop QD (stop 10/08/16) 09/16/16   [provider]  sertraline (ZOLOFT) 50 MG tablet Take 50 mg by mouth daily.    [provider]  Sodium Phosphates (RA SALINE ENEMA) 19-7 GM/118ML ENEM Place 1 each rectally as needed (for constipation).    [provider]  traZODone (DESYREL) 100 MG tablet Take 100 mg by mouth 2 (two) times daily.    [provider]  UNABLE TO FIND Med Name: Med Pass 120  mL twice daily    [provider]    Family History Family History  Problem Relation Age of Onset  . Heart attack Mother   . Diabetes Father   . Thyroid disease Neg Hx     Social History Social History  Substance Use Topics  . Smoking status: Former Research scientist (life sciences)  . Smokeless tobacco: Never Used     Comment: Smoked age [age] up to < 1 ppd, usually half a pack per day, mainly one half pack per day  . Alcohol use No     Allergies   Patient has no known allergies.   Review of Systems Review of Systems  Constitutional: Negative for appetite change and fever.  HENT: Negative for congestion.   Respiratory: Negative for shortness of breath.   Cardiovascular: Negative for chest pain.  Genitourinary: Negative for flank pain.  Musculoskeletal: Negative for back pain.       Left ankle pain  Neurological: Negative for weakness.  Hematological: Negative for adenopathy.  Psychiatric/Behavioral: Negative for confusion.     Physical Exam Updated Vital Signs BP (!) 136/109 (BP Location: Right Arm)   Temp 99.8 F (37.7 C) (Oral)   Resp 17   SpO2 99%   Physical Exam  Constitutional: She appears well-developed.  HENT:  Head: Normocephalic.  Neck: Neck supple.  Cardiovascular: Normal rate.   Pulmonary/Chest: Effort normal.  Abdominal: There is no tenderness.  Musculoskeletal: She exhibits tenderness.  Tenderness over left ankle decreased range of motion. Also tender proximally over fibula. Anterior knee nontender. No tenderness over hips lumbar spine or upper extremities. No cervical spine tenderness. No evidence of trauma on head. Neurovascular intact in bilateral feet.  Skin: Skin is warm. Capillary refill takes less than 2 seconds.     ED Treatments / Results  Labs (all labs ordered are listed, but only abnormal results are displayed) Labs Reviewed - No data to display  EKG  EKG Interpretation  Date/Time:  Sunday October 02 2016 12:42:44 EDT Ventricular Rate:    64 PR Interval:    QRS Duration: 115 QT Interval:  436 QTC Calculation: 450 R Axis:   71 Text Interpretation:  Sinus rhythm Incomplete right bundle branch block Low voltage, extremity leads Confirmed by Davonna Belling [phone]) on 10/02/2016 1:29:33 PM       Radiology Dg Tibia/fibula Left  Result Date: 10/02/2016 CLINICAL DATA:  [place].  Left leg pain. EXAM: LEFT TIBIA AND FIBULA - 2 VIEW COMPARISON:  None. FINDINGS: Bimalleolar ankle fractures are again demonstrated. No fractures of the tibia or fibular shaft. The knee joint is maintained. IMPRESSION: Bimalleolar ankle fractures. No fractures of the tibial or fibular shafts. Electronically Signed   By: Marijo Sanes M.D.   On: 10/02/2016 13:20   Dg Ankle Complete Left  Result Date: 10/02/2016 CLINICAL DATA:  [place].  Left ankle pain. EXAM: LEFT ANKLE COMPLETE - 3+ VIEW COMPARISON:  None. FINDINGS: Bimalleolar ankle fractures noted. There is an oblique coursing relatively nondisplaced distal fibular fracture at and above the level of the ankle mortise. There is also a transverse nondisplaced fracture involving the medial malleolus at the level of the ankle mortise. No fracture of the posterior tibia is identified. The tibiotalar and subtalar joints are maintained. No mid or hindfoot fractures are identified. IMPRESSION: Relatively nondisplaced bimalleolar ankle fractures. Electronically Signed   By: Marijo Sanes M.D.   On: 10/02/2016 13:19    Procedures Procedures (including critical care time)  Medications Ordered in ED Medications  HYDROcodone-acetaminophen (NORCO/VICODIN) 5-325 MG per tablet 1 tablet (1 tablet Oral Given 10/02/16 1514)     Initial Impression / Assessment and Plan / ED Course  I have reviewed the triage vital signs and the nursing notes.  Pertinent labs & imaging results that were available during my care of the patient were reviewed by me and considered in my medical decision making (see chart for details).      Patient with fall and ankle fracture. Basically nondisplaced bimalleolar. Splinted. Will have follow-up with Dr. Alvan Dame, who did her hip surgery previously in whom she would like to see again  Final Clinical Impressions(s) / ED Diagnoses   Final diagnoses:  Bimalleolar ankle fracture, left, closed, initial encounter    New Prescriptions New Prescriptions   No medications on file     Davonna Belling, MD 10/02/16 1515

## 2016-10-02 NOTE — ED Triage Notes (Signed)
Patient brought in via EMS from [facility]. Patient  states patient fell around midnight while attempting to go to bathroom. Patient states she hit her head but did not lose consciousness. Patient c/o left ankle/leg pain rating it a  10/10. Left ankle appears swollen.

## 2016-10-02 NOTE — ED Notes (Signed)
Bed: VE72 Expected date:  Expected time:  Means of arrival:  Comments: 76 L ankle injury

## 2016-10-02 NOTE — ED Notes (Signed)
Report called to Thomasenia Sales, RN at [facility] patient transferred via .

## 2016-10-03 ENCOUNTER — Inpatient Hospital Stay (HOSPITAL_COMMUNITY)
Admission: EM | Admit: 2016-10-03 | Discharge: 2016-10-07 | DRG: 070 | Disposition: A | Discharge status: Skilled Nursing Facility | Payer: Medicare Other | Attending: Internal Medicine | Admitting: Internal Medicine

## 2016-10-03 ENCOUNTER — Encounter (HOSPITAL_COMMUNITY): Payer: Self-pay | Admitting: Oncology

## 2016-10-03 DIAGNOSIS — R410 Disorientation, unspecified: Secondary | ICD-10-CM | POA: Diagnosis not present

## 2016-10-03 DIAGNOSIS — G9341 Metabolic encephalopathy: Secondary | ICD-10-CM | POA: Diagnosis not present

## 2016-10-03 DIAGNOSIS — F419 Anxiety disorder, unspecified: Secondary | ICD-10-CM | POA: Diagnosis present

## 2016-10-03 DIAGNOSIS — I471 Supraventricular tachycardia, unspecified: Secondary | ICD-10-CM | POA: Diagnosis present

## 2016-10-03 DIAGNOSIS — R531 Weakness: Secondary | ICD-10-CM

## 2016-10-03 DIAGNOSIS — F329 Major depressive disorder, single episode, unspecified: Secondary | ICD-10-CM | POA: Diagnosis present

## 2016-10-03 DIAGNOSIS — S82892D Other fracture of left lower leg, subsequent encounter for closed fracture with routine healing: Secondary | ICD-10-CM | POA: Diagnosis not present

## 2016-10-03 DIAGNOSIS — M4800 Spinal stenosis, site unspecified: Secondary | ICD-10-CM | POA: Diagnosis present

## 2016-10-03 DIAGNOSIS — J9601 Acute respiratory failure with hypoxia: Secondary | ICD-10-CM | POA: Diagnosis present

## 2016-10-03 DIAGNOSIS — S82842A Displaced bimalleolar fracture of left lower leg, initial encounter for closed fracture: Secondary | ICD-10-CM | POA: Diagnosis present

## 2016-10-03 DIAGNOSIS — N390 Urinary tract infection, site not specified: Secondary | ICD-10-CM | POA: Diagnosis present

## 2016-10-03 DIAGNOSIS — I130 Hypertensive heart and chronic kidney disease with heart failure and stage 1 through stage 4 chronic kidney disease, or unspecified chronic kidney disease: Secondary | ICD-10-CM | POA: Diagnosis not present

## 2016-10-03 DIAGNOSIS — Z833 Family history of diabetes mellitus: Secondary | ICD-10-CM

## 2016-10-03 DIAGNOSIS — R4182 Altered mental status, unspecified: Secondary | ICD-10-CM | POA: Diagnosis present

## 2016-10-03 DIAGNOSIS — K219 Gastro-esophageal reflux disease without esophagitis: Secondary | ICD-10-CM | POA: Diagnosis present

## 2016-10-03 DIAGNOSIS — J449 Chronic obstructive pulmonary disease, unspecified: Secondary | ICD-10-CM | POA: Diagnosis not present

## 2016-10-03 DIAGNOSIS — N179 Acute kidney failure, unspecified: Secondary | ICD-10-CM | POA: Diagnosis not present

## 2016-10-03 DIAGNOSIS — Z96641 Presence of right artificial hip joint: Secondary | ICD-10-CM | POA: Diagnosis present

## 2016-10-03 DIAGNOSIS — J441 Chronic obstructive pulmonary disease with (acute) exacerbation: Secondary | ICD-10-CM | POA: Diagnosis present

## 2016-10-03 DIAGNOSIS — G629 Polyneuropathy, unspecified: Secondary | ICD-10-CM

## 2016-10-03 DIAGNOSIS — R0602 Shortness of breath: Secondary | ICD-10-CM

## 2016-10-03 DIAGNOSIS — S82845A Nondisplaced bimalleolar fracture of left lower leg, initial encounter for closed fracture: Secondary | ICD-10-CM | POA: Diagnosis not present

## 2016-10-03 DIAGNOSIS — I5032 Chronic diastolic (congestive) heart failure: Secondary | ICD-10-CM

## 2016-10-03 DIAGNOSIS — R9431 Abnormal electrocardiogram [ECG] [EKG]: Secondary | ICD-10-CM | POA: Diagnosis not present

## 2016-10-03 DIAGNOSIS — B9620 Unspecified Escherichia coli [E. coli] as the cause of diseases classified elsewhere: Secondary | ICD-10-CM | POA: Diagnosis present

## 2016-10-03 DIAGNOSIS — Z8249 Family history of ischemic heart disease and other diseases of the circulatory system: Secondary | ICD-10-CM

## 2016-10-03 DIAGNOSIS — S82899A Other fracture of unspecified lower leg, initial encounter for closed fracture: Secondary | ICD-10-CM

## 2016-10-03 DIAGNOSIS — R404 Transient alteration of awareness: Secondary | ICD-10-CM | POA: Diagnosis not present

## 2016-10-03 DIAGNOSIS — Z23 Encounter for immunization: Secondary | ICD-10-CM | POA: Diagnosis not present

## 2016-10-03 DIAGNOSIS — N183 Chronic kidney disease, stage 3 (moderate): Secondary | ICD-10-CM | POA: Diagnosis present

## 2016-10-03 DIAGNOSIS — D539 Nutritional anemia, unspecified: Secondary | ICD-10-CM | POA: Diagnosis present

## 2016-10-03 DIAGNOSIS — G894 Chronic pain syndrome: Secondary | ICD-10-CM | POA: Diagnosis present

## 2016-10-03 LAB — URINALYSIS, ROUTINE W REFLEX MICROSCOPIC
Bilirubin Urine: NEGATIVE
Glucose, UA: NEGATIVE mg/dL
Hgb urine dipstick: NEGATIVE
Ketones, ur: NEGATIVE mg/dL
Nitrite: NEGATIVE
Protein, ur: 30 mg/dL — AB
Specific Gravity, Urine: 1.021 (ref 1.005–1.030)
pH: 5 (ref 5.0–8.0)

## 2016-10-03 LAB — CBC WITH DIFFERENTIAL/PLATELET
Basophils Absolute: 0 10*3/uL (ref 0.0–0.1)
Basophils Relative: 0 %
Eosinophils Absolute: 0.1 10*3/uL (ref 0.0–0.7)
Eosinophils Relative: 2 %
HCT: 35.3 % — ABNORMAL LOW (ref 36.0–46.0)
Hemoglobin: 10.8 g/dL — ABNORMAL LOW (ref 12.0–15.0)
Lymphocytes Relative: 7 %
Lymphs Abs: 0.6 10*3/uL — ABNORMAL LOW (ref 0.7–4.0)
MCH: 30.5 pg (ref 26.0–34.0)
MCHC: 30.6 g/dL (ref 30.0–36.0)
MCV: 99.7 fL (ref 78.0–100.0)
Monocytes Absolute: 0.5 10*3/uL (ref 0.1–1.0)
Monocytes Relative: 7 %
Neutro Abs: 6.9 10*3/uL (ref 1.7–7.7)
Neutrophils Relative %: 84 %
Platelets: 168 10*3/uL (ref 150–400)
RBC: 3.54 MIL/uL — ABNORMAL LOW (ref 3.87–5.11)
RDW: 14.3 % (ref 11.5–15.5)
WBC: 8.1 10*3/uL (ref 4.0–10.5)

## 2016-10-03 LAB — BASIC METABOLIC PANEL
Anion gap: 12 (ref 5–15)
BUN: 27 mg/dL — ABNORMAL HIGH (ref 6–20)
CO2: 26 mmol/L (ref 22–32)
Calcium: 8.9 mg/dL (ref 8.9–10.3)
Chloride: 100 mmol/L — ABNORMAL LOW (ref 101–111)
Creatinine, Ser: 1.8 mg/dL — ABNORMAL HIGH (ref 0.44–1.00)
GFR calc Af Amer: 30 mL/min — ABNORMAL LOW (ref >60)
GFR calc non Af Amer: 26 mL/min — ABNORMAL LOW (ref >60)
Glucose, Bld: 118 mg/dL — ABNORMAL HIGH (ref 65–99)
Potassium: 5 mmol/L (ref 3.5–5.1)
Sodium: 138 mmol/L (ref 135–145)

## 2016-10-03 LAB — I-STAT TROPONIN, ED: Troponin i, poc: 0.06 ng/mL (ref 0.00–0.08)

## 2016-10-03 LAB — TSH: TSH: 2.991 u[IU]/mL (ref 0.350–4.500)

## 2016-10-03 MED ORDER — MAGNESIUM HYDROXIDE 400 MG/5ML PO SUSP: 30.0000 mL | Freq: Every day | ORAL | Status: DC | PRN

## 2016-10-03 MED ORDER — UMECLIDINIUM BROMIDE 62.5 MCG/INH IN AEPB
1.0000 | INHALATION_SPRAY | Freq: Every day | RESPIRATORY_TRACT | Status: DC
  Administered 2016-10-04 – 2016-10-07 (×4): 1 via RESPIRATORY_TRACT
  Filled 2016-10-03: qty 7

## 2016-10-03 MED ORDER — KETOROLAC TROMETHAMINE 0.5 % OP SOLN
1.0000 [drp] | Freq: Four times a day (QID) | OPHTHALMIC | Status: DC
  Administered 2016-10-03 – 2016-10-04 (×2): 1 [drp] via OPHTHALMIC
  Filled 2016-10-03: qty 3

## 2016-10-03 MED ORDER — GABAPENTIN 300 MG PO CAPS
600.0000 mg | ORAL_CAPSULE | Freq: Three times a day (TID) | ORAL | Status: DC
  Administered 2016-10-03 – 2016-10-06 (×10): 600 mg via ORAL
  Filled 2016-10-03 (×10): qty 2

## 2016-10-03 MED ORDER — LEVALBUTEROL HCL 1.25 MG/0.5ML IN NEBU
1.2500 mg | INHALATION_SOLUTION | Freq: Four times a day (QID) | RESPIRATORY_TRACT | Status: DC | PRN
  Filled 2016-10-03: qty 0.5

## 2016-10-03 MED ORDER — NAPHAZOLINE-GLYCERIN 0.012-0.2 % OP SOLN
1.0000 [drp] | Freq: Two times a day (BID) | OPHTHALMIC | Status: DC
  Administered 2016-10-03 – 2016-10-07 (×8): 1 [drp] via OPHTHALMIC
  Filled 2016-10-03: qty 15

## 2016-10-03 MED ORDER — SODIUM CHLORIDE 0.9 % IV BOLUS (SEPSIS)
500.0000 mL | Freq: Once | INTRAVENOUS | Status: AC
  Administered 2016-10-03: 500 mL via INTRAVENOUS

## 2016-10-03 MED ORDER — ACETAMINOPHEN 325 MG PO TABS
650.0000 mg | ORAL_TABLET | Freq: Four times a day (QID) | ORAL | Status: DC | PRN
  Administered 2016-10-03 – 2016-10-04 (×3): 650 mg via ORAL
  Filled 2016-10-03 (×4): qty 2

## 2016-10-03 MED ORDER — PANTOPRAZOLE SODIUM 40 MG PO TBEC
40.0000 mg | DELAYED_RELEASE_TABLET | Freq: Every day | ORAL | Status: DC
  Administered 2016-10-03 – 2016-10-07 (×5): 40 mg via ORAL
  Filled 2016-10-03 (×5): qty 1

## 2016-10-03 MED ORDER — DOCUSATE SODIUM 100 MG PO CAPS: 100.0000 mg | ORAL_CAPSULE | Freq: Two times a day (BID) | ORAL | Status: DC | PRN

## 2016-10-03 MED ORDER — SODIUM CHLORIDE 0.9 % IV SOLN
INTRAVENOUS | Status: DC
  Administered 2016-10-03: 1000 mL via INTRAVENOUS
  Administered 2016-10-04: 04:00:00 via INTRAVENOUS

## 2016-10-03 MED ORDER — MIRTAZAPINE 15 MG PO TABS
15.0000 mg | ORAL_TABLET | Freq: Every day | ORAL | Status: DC
  Administered 2016-10-03 – 2016-10-06 (×4): 15 mg via ORAL
  Filled 2016-10-03 (×5): qty 1

## 2016-10-03 MED ORDER — PREDNISOLONE ACETATE 1 % OP SUSP
1.0000 [drp] | Freq: Every day | OPHTHALMIC | Status: DC
  Administered 2016-10-04: 1 [drp] via OPHTHALMIC
  Filled 2016-10-03: qty 1

## 2016-10-03 MED ORDER — DIGOXIN 125 MCG PO TABS
0.2500 mg | ORAL_TABLET | ORAL | Status: DC
Start: 2016-10-03 — End: 2016-10-07
  Administered 2016-10-03 – 2016-10-07 (×3): 0.25 mg via ORAL
  Filled 2016-10-03 (×3): qty 2

## 2016-10-03 MED ORDER — ACETAMINOPHEN 650 MG RE SUPP: 650.0000 mg | Freq: Four times a day (QID) | RECTAL | Status: DC | PRN

## 2016-10-03 MED ORDER — DIGOXIN 125 MCG PO TABS
0.1250 mg | ORAL_TABLET | ORAL | Status: DC
  Administered 2016-10-04: 0.125 mg via ORAL
  Filled 2016-10-03 (×2): qty 1

## 2016-10-03 MED ORDER — FLECAINIDE ACETATE 50 MG PO TABS
50.0000 mg | ORAL_TABLET | Freq: Two times a day (BID) | ORAL | Status: DC
  Administered 2016-10-03 – 2016-10-07 (×9): 50 mg via ORAL
  Filled 2016-10-03 (×10): qty 1

## 2016-10-03 MED ORDER — INFLUENZA VAC SPLIT HIGH-DOSE 0.5 ML IM SUSY
0.5000 mL | PREFILLED_SYRINGE | INTRAMUSCULAR | Status: AC
  Administered 2016-10-04: 0.5 mL via INTRAMUSCULAR
  Filled 2016-10-03: qty 0.5

## 2016-10-03 MED ORDER — TRAMADOL HCL 50 MG PO TABS
50.0000 mg | ORAL_TABLET | Freq: Once | ORAL | Status: AC
  Administered 2016-10-03: 50 mg via ORAL
  Filled 2016-10-03: qty 1

## 2016-10-03 MED ORDER — DEXTROSE 5 % IV SOLN
1.0000 g | INTRAVENOUS | Status: AC
  Administered 2016-10-03 – 2016-10-07 (×5): 1 g via INTRAVENOUS
  Filled 2016-10-03 (×5): qty 10

## 2016-10-03 MED ORDER — ONDANSETRON HCL 4 MG/2ML IJ SOLN: 4.0000 mg | Freq: Four times a day (QID) | INTRAMUSCULAR | Status: DC | PRN

## 2016-10-03 MED ORDER — SERTRALINE HCL 50 MG PO TABS
50.0000 mg | ORAL_TABLET | Freq: Every day | ORAL | Status: DC
  Administered 2016-10-03 – 2016-10-06 (×4): 50 mg via ORAL
  Filled 2016-10-03 (×5): qty 1

## 2016-10-03 MED ORDER — HEPARIN SODIUM (PORCINE) 5000 UNIT/ML IJ SOLN
5000.0000 [IU] | Freq: Three times a day (TID) | INTRAMUSCULAR | Status: DC
  Administered 2016-10-03 – 2016-10-07 (×12): 5000 [IU] via SUBCUTANEOUS
  Filled 2016-10-03 (×12): qty 1

## 2016-10-03 MED ORDER — ONDANSETRON HCL 4 MG PO TABS: 4.0000 mg | ORAL_TABLET | Freq: Four times a day (QID) | ORAL | Status: DC | PRN

## 2016-10-03 NOTE — ED Notes (Signed)
C/o slight headache rates pain 8/10. Medication given

## 2016-10-03 NOTE — ED Triage Notes (Signed)
Pt bib [facility] from [facility] d/t generalized weakness.  Per EMS pt is A&O x 4.  Face is symmetrical.  Grips equal b/l.  Pt reported to EMS that she just, "Felt bad."

## 2016-10-03 NOTE — ED Notes (Signed)
Pt moved to F 10 and placed on hospital bed.

## 2016-10-03 NOTE — ED Notes (Signed)
ORDERED TRAY

## 2016-10-03 NOTE — Progress Notes (Signed)
[place] for polypharmacy resulting in hypersomolence  Medications:  Hydrocodone/APAP Gabapentin Mirtazapine Trazodone  Assessment: 89 yof is on multiple medications including those listed above. Those listed about can cause drowsiness especially when used in conjunction with other sedating medications.   Gabapentin can accumulate in renal dysfunction. Pt appears to have some AKI with current Scr of 1.8 and baseline ~1.1.   In addition, pt is on multiple rate and rhythm control medications, especially digoxin, which may accumulate in renal dysfunction. This could lead to some mental status changes.   Plan:  - Evaluate patient need for the above listed medications and DC any that the patient does not need or consider reducing doses to the lowest effective dose - Consider checking a digoxin level  Tim Corriher, Rande Lawman 10/03/2016,11:29 AM

## 2016-10-03 NOTE — ED Notes (Signed)
Pt up to bedpan .

## 2016-10-03 NOTE — H&P (Signed)
History and Physical    Dawn [name] FGH:[id number] DOB: April 08, 1940 DOA: 10/03/2016  PCP: [name], MD Patient coming from: [facility]  Chief Complaint: somnolence and hypoxemia  HPI: Dawn [name] is a 76 y.o. female with medical history significant of COPD, anxiety, depression, recurrent UTIs.  The patient was seen on the day prior to admission after sustaining a mechanical fall resulting in a left nondisplaced bimalleolar ankle fracture. Patient has been appropriately splinted in a set follow-up with Dr. Alvan Dame.   On day of admission patient was reportedly hypoxic with O2 saturations around 78%, clammy, and somnolent. Nursing home staff administered 4 L nasal cannula with improvement in O2 saturation to 92%. Patient was also noted to have somewhat slurred speech at that time. Patient was transported to the emergency room. Patient has no recollection of this other than stating that she was told that she had the symptoms. Patient endorses feeling in her normal state of health up until onset of episode and states she has been "delirious and shaking really bad." Denies chest pain, shortness of breath, palpitations, cough, fever, rash, dysuria, frequency, lower back pain, syncope area did. Patient does seem quite slow in her mentation overall.  ED Course: objective findings outlined below. Given IVF  Review of Systems: As per HPI otherwise all other systems reviewed and are negative  Ambulatory Status: unable to ambulate due to L ankle fracture  Past Medical History:  Diagnosis Date  . COPD (chronic obstructive pulmonary disease) ([place])   . History of anxiety   . History of depression   . Spinal stenosis   . UTI (urinary tract infection)     Past Surgical History:  Procedure Laterality Date  . FLEXIBLE SIGMOIDOSCOPY Left 11/18/2015   Procedure: FLEXIBLE SIGMOIDOSCOPY;  Surgeon: [name], MD;  Location: [facility];  Service: Endoscopy;  Laterality: Left;  .  FLEXIBLE SIGMOIDOSCOPY N/A 11/20/2015   Procedure: FLEXIBLE SIGMOIDOSCOPY;  Surgeon: [name], MD;  Location: [facility] ENDOSCOPY;  Service: Endoscopy;  Laterality: N/A;  . HIP ARTHROPLASTY Right 01/12/2016   Procedure: ARTHROPLASTY BIPOLAR HIP (HEMIARTHROPLASTY);  Surgeon: [name], MD;  Location: [facility];  Service: Orthopedics;  Laterality: Right;    Social History   Social History  . Marital status: Divorced    Spouse name: N/A  . Number of children: N/A  . Years of education: N/A   Occupational History  . Not on file.   Social History Main Topics  . Smoking status: Former Research scientist (life sciences)  . Smokeless tobacco: Never Used     Comment: Smoked age [age] up to < 1 ppd, usually half a pack per day, mainly one half pack per day  . Alcohol use No  . Drug use: No  . Sexual activity: Not Currently   Other Topics Concern  . Not on file   Social History Narrative  . No narrative on file    No Known Allergies  Family History  Problem Relation Age of Onset  . Heart attack Mother   . Diabetes Father   . Thyroid disease Neg Hx       Prior to Admission medications   Medication Sig Start Date End Date Taking? Authorizing Provider  bisacodyl (DULCOLAX) 10 MG suppository Place 10 mg rectally as needed for moderate constipation.   Yes [provider]  digoxin (LANOXIN) 0.125 MG tablet Take 1 tablet (0.125 mg total) by mouth daily. Patient taking differently: Take 0.125 mg by mouth See admin instructions. On Tuesday, Thursday, Saturday  and Sunday 01/29/16  Yes Regalado, Belkys A, MD  digoxin (LANOXIN) 0.25 MG tablet Take 0.25 mg by mouth every Monday, Wednesday, and Friday.   Yes [provider]  docusate sodium (COLACE) 100 MG capsule Take 1 capsule (100 mg total) by mouth 2 (two) times daily as needed for mild constipation. 01/28/16  Yes Regalado, Belkys A, MD  ferrous sulfate 325 (65 FE) MG tablet Take 325 mg by mouth daily with breakfast.    Yes [provider]    flecainide (TAMBOCOR) 50 MG tablet Take 50 mg by mouth 2 (two) times daily.   Yes [provider]  gabapentin (NEURONTIN) 600 MG tablet Take 600 mg by mouth 3 (three) times daily.   Yes [provider]  HYDROcodone-acetaminophen (NORCO/VICODIN) 5-325 MG tablet Take 1 tablet by mouth every 12 (twelve) hours as needed for severe pain. Patient taking differently: Take 1 tablet by mouth daily as needed for severe pain.  09/05/16  Yes Medina-Vargas, Monina C, NP  INCRUSE ELLIPTA 62.5 MCG/INH AEPB Inhale 1 puff into the lungs daily.  10/19/15  Yes [provider]  ketorolac (ACULAR) 0.4 % SOLN Place 1 drop into the right eye 4 (four) times daily. Due to stop on 10/06/16 09/22/16 10/06/16 Yes [provider]  levalbuterol Penne Lash) 0.63 MG/3ML nebulizer solution Take 0.63 mg by nebulization 2 (two) times daily.   Yes [provider]  levalbuterol (XOPENEX) 1.25 MG/3ML nebulizer solution Take 1.25 mg by nebulization every 6 (six) hours as needed for wheezing.   Yes [provider]  magnesium hydroxide (MILK OF MAGNESIA) 400 MG/5ML suspension Take 30 mLs by mouth daily as needed for mild constipation.   Yes [provider]  metoprolol succinate (TOPROL-XL) 25 MG 24 hr tablet Take 25 mg by mouth at bedtime.   Yes [provider]  mirtazapine (REMERON) 15 MG tablet Take 15 mg by mouth at bedtime.   Yes [provider]  naphazoline-glycerin (CLEAR EYES REDNESS RELIEF) 0.012-0.2 % SOLN Place 1 drop into both eyes 2 (two) times daily.   Yes [provider]  pantoprazole (PROTONIX) 40 MG tablet Take 40 mg by mouth daily.   Yes [provider]  prednisoLONE acetate (PREDNISOLONE ACETATE P-F) 1 % ophthalmic suspension Place 1 drop into the left eye daily. Due to stop on 10/08/16 09/16/16 10/08/16 Yes [provider]  sertraline (ZOLOFT) 50 MG tablet Take 50 mg by mouth daily.   Yes [provider]  Sodium  Phosphates (RA SALINE ENEMA) 19-7 GM/118ML ENEM Place 1 each rectally as needed (for constipation).   Yes [provider]  traZODone (DESYREL) 100 MG tablet Take 100 mg by mouth 2 (two) times daily.   Yes [provider]  UNABLE TO FIND Take 120 mLs by mouth 2 (two) times daily. Med Name: Med Pass   Yes [provider]  metoprolol tartrate (LOPRESSOR) 25 MG tablet Take 0.5 tablets (12.5 mg total) by mouth daily. Patient not taking: Reported on 10/03/2016 05/11/16   Lauree Chandler, NP    Physical Exam: Vitals:   10/03/16 0615 10/03/16 0845 10/03/16 0900 10/03/16 0930  BP: (!) 81/54 105/64 (!) 112/52 (!) 100/59  Pulse: (!) 57 62 61   Resp: (!) 22 18 (!) 23 (!) 22  Temp:      TempSrc:      SpO2: 99% 98% 99%      General: Elderly and frail-appearing Eyes:  PERRL, EOMI, normal lids, iris ENT: dry mm, nml hearing  Neck:  no LAD, masses or thyromegaly Cardiovascular: III/VI systolic murmur,  RRR, No LE edema.  Respiratory:  CTA bilaterally, no w/r/r. Normal respiratory effort. Abdomen:  soft, ntnd, NABS Skin:  no rash or induration seen on limited exam Musculoskeletal: L ankle in splint, no other bony abnormalities  Psychiatric: Follows basic commands, pleasant, AOx2 Neurologic:  CN 2-12 grossly intact, moves all extremities in coordinated fashion, sensation intact  Labs on Admission: I have personally reviewed following labs and imaging studies  CBC:  Recent Labs Lab 10/03/16 0457  WBC 8.1  NEUTROABS 6.9  HGB 10.8*  HCT 35.3*  MCV 99.7  PLT 811   Basic Metabolic Panel:  Recent Labs Lab 10/03/16 0457  NA 138  K 5.0  CL 100*  CO2 26  GLUCOSE 118*  BUN 27*  CREATININE 1.80*  CALCIUM 8.9   GFR: CrCl cannot be calculated (Unknown ideal weight.). Liver Function Tests: No results for input(s): AST, ALT, ALKPHOS, BILITOT, PROT, ALBUMIN in the last 168 hours. No results for input(s): LIPASE, AMYLASE in the last 168 hours. No results for  input(s): AMMONIA in the last 168 hours. Coagulation Profile: No results for input(s): INR, PROTIME in the last 168 hours. Cardiac Enzymes: No results for input(s): CKTOTAL, CKMB, CKMBINDEX, TROPONINI in the last 168 hours. BNP (last 3 results) No results for input(s): PROBNP in the last 8760 hours. HbA1C: No results for input(s): HGBA1C in the last 72 hours. CBG: No results for input(s): GLUCAP in the last 168 hours. Lipid Profile: No results for input(s): CHOL, HDL, LDLCALC, TRIG, CHOLHDL, LDLDIRECT in the last 72 hours. Thyroid Function Tests: No results for input(s): TSH, T4TOTAL, FREET4, T3FREE, THYROIDAB in the last 72 hours. Anemia Panel: No results for input(s): VITAMINB12, FOLATE, FERRITIN, TIBC, IRON, RETICCTPCT in the last 72 hours. Urine analysis:    Component Value Date/Time   COLORURINE AMBER (A) 10/03/2016 0800   APPEARANCEUR CLOUDY (A) 10/03/2016 0800   LABSPEC 1.021 10/03/2016 0800   PHURINE 5.0 10/03/2016 0800   GLUCOSEU NEGATIVE 10/03/2016 0800   HGBUR NEGATIVE 10/03/2016 0800   BILIRUBINUR NEGATIVE 10/03/2016 0800   KETONESUR NEGATIVE 10/03/2016 0800   PROTEINUR 30 (A) 10/03/2016 0800   UROBILINOGEN 0.2 04/29/2008 2235   NITRITE NEGATIVE 10/03/2016 0800   LEUKOCYTESUR LARGE (A) 10/03/2016 0800    Creatinine Clearance: CrCl cannot be calculated (Unknown ideal weight.).  Sepsis Labs: @LABRCNTIP (procalcitonin:4,lacticidven:4) )No results found for this or any previous visit (from the past 240 hour(s)).   Radiological Exams on Admission: Dg Tibia/fibula Left  Result Date: 10/02/2016 CLINICAL DATA:  [place].  Left leg pain. EXAM: LEFT TIBIA AND FIBULA - 2 VIEW COMPARISON:  None. FINDINGS: Bimalleolar ankle fractures are again demonstrated. No fractures of the tibia or fibular shaft. The knee joint is maintained. IMPRESSION: Bimalleolar ankle fractures. No fractures of the tibial or fibular shafts. Electronically Signed   By: Marijo Sanes M.D.   On: 10/02/2016  13:20   Dg Ankle Complete Left  Result Date: 10/02/2016 CLINICAL DATA:  [place].  Left ankle pain. EXAM: LEFT ANKLE COMPLETE - 3+ VIEW COMPARISON:  None. FINDINGS: Bimalleolar ankle fractures noted. There is an oblique coursing relatively nondisplaced distal fibular fracture at and above the level of the ankle mortise. There is also a transverse nondisplaced fracture involving the medial malleolus at the level of the ankle mortise. No fracture of the posterior tibia is identified. The tibiotalar and subtalar joints are maintained. No mid or hindfoot fractures are identified. IMPRESSION: Relatively nondisplaced bimalleolar ankle  fractures. Electronically Signed   By: Marijo Sanes M.D.   On: 10/02/2016 13:19    EKG: Independently reviewed. No ACS, sinus. No significant change w/ previous.   Assessment/Plan Active Problems:   AKI (acute kidney injury) (HCC)   COPD (chronic obstructive pulmonary disease) (H[facility])   Delirium   SVT (supraventricular tachycardia) (HCC)   Altered mental status   Ankle fracture   Neuropathy   Chronic diastolic CHF (congestive heart failure) (HCC)   AMS: likely secondary to UTI. Questionable early dementia at baseline. Currently pt slow in mention but no focal deficits such as slurred speech or unilateral weakness. CT heat w/o acute abnormality.  - Ceftriaxone - UCX  AKI: BMP 1.8. basleine 1.0 - IVF - BMP in am  L ankle fracture: Sustained on 9/23. Set to f/u w/ Dr Alvan Dame in outpt setting - f/u as directed after DC  SVT/CHF: followed closeley by cardiology. Concern for undiagnosed/captured Afib on EKG. H/o diastolic CHF currently well compensated. - continyue flecanide, Digoxin  COPD: no hypoxemia at time of arrival. No sign of COIPD exacerbation - O2 PRN - continue ellipta, xopenex  Neuropathy: - continue neurontin  Depression: - continue remeron, xoloft  GERD: - continue PPI   DVT prophylaxis: Hep  Code Status: full  Family Communication: none    Disposition Plan: pending improvement  Consults called: noen  Admission status: observation   Waldemar Dickens MD [facility]  If 7PM-7AM, please contact night-coverage [email or web address] Password [facility]  10/03/2016, 9:47 AM

## 2016-10-03 NOTE — ED Notes (Signed)
Spoke with patient's daughter, she would like to be called with updates on if she is being admitted or sent back to [place].  Phone is [phone].

## 2016-10-03 NOTE — ED Notes (Signed)
Pt c/o headache

## 2016-10-03 NOTE — ED Provider Notes (Signed)
[facility] DEPT Provider Note   CSN: [id number] Arrival date & time: 10/03/16  0259     History   Chief Complaint Chief Complaint  Patient presents with  . Weakness    HPI Dawn [name] is a 76 y.o. female with history of COPD, recurrent UTI, left bimalleolar ankle fracture yesterday who presents from her nursing home, [facility], with generalized weakness that began before bed last night. Patient reports she was feeling very shaky too. She was unsure whether she had surgery on her L ankle that is in a splint. Per medical record, no surgery was completed and patient was to follow up in Dr. Aurea Graff office. She reports her ankle has been hurting her a lot. She does not think she has been taking any pain medication for it. She denies any other pain. Denies any chest pain, shortness of breath, abdominal pain, nausea, vomiting, urinary symptoms.  HPI  Past Medical History:  Diagnosis Date  . COPD (chronic obstructive pulmonary disease) ([place])   . History of anxiety   . History of depression   . Spinal stenosis   . UTI (urinary tract infection)     Patient Active Problem List   Diagnosis Date Noted  . Recurrent falls 02/02/2016  . 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 Upper GI bleed 01/23/2016  . Blood loss anemia 01/23/2016  . Acute diastolic CHF (congestive heart failure) ([place]) 01/23/2016  . Heme + stool   . Abnormal CT scan, colon   . 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 Hypoxia   . At risk for adverse drug event 01/18/2016  . Subcapital fracture of right hip, closed 01/12/2016  . History of depression 12/12/2015  . Alteration consciousness 11/30/2015  . SVT (supraventricular tachycardia) ([place]) 11/27/2015  . Diarrhea 11/27/2015  . Depression 11/27/2015  . Polyneuropathy 11/27/2015  . Renal insufficiency   . Protein-calorie malnutrition, severe 11/18/2015  . Intra-abdominal fluid collection   . Premature atrial contractions   . Functional diarrhea     . Sepsis ([place])   . Hyperthyroidism   . Hypotension   . Septic shock ([place])   . Acute encephalopathy   . Hypokalemia   . Acute delirium 11/10/2015  . Acute kidney injury ([place]) 11/10/2015  . Anemia, iron deficiency 11/10/2015  . Anxiety 11/10/2015  . Chronic pain syndrome 11/10/2015  . Recurrent UTI 11/10/2015  . Acute respiratory failure with hypoxia ([place]) 11/10/2015  . COPD (chronic obstructive pulmonary disease) ([place]) 11/10/2015  . Acute hyperglycemia 11/10/2015  . CKD (chronic kidney disease) stage 3, GFR 30-59 ml/min 11/10/2015  . Narrow complex tachycardia ([place]) 11/10/2015  . HTN (hypertension) 11/10/2015  . Generalized weakness 11/10/2015  . Anemia   . Delirium   . Hypoxemia   . Tachycardia     Past Surgical History:  Procedure Laterality Date  . FLEXIBLE SIGMOIDOSCOPY Left 11/18/2015   Procedure: FLEXIBLE SIGMOIDOSCOPY;  Surgeon: [name], MD;  Location: [facility];  Service: Endoscopy;  Laterality: Left;  . FLEXIBLE SIGMOIDOSCOPY N/A 11/20/2015   Procedure: FLEXIBLE SIGMOIDOSCOPY;  Surgeon: [name], MD;  Location: [facility];  Service: Endoscopy;  Laterality: N/A;  . HIP ARTHROPLASTY Right 01/12/2016   Procedure: ARTHROPLASTY BIPOLAR HIP (HEMIARTHROPLASTY);  Surgeon: [name], MD;  Location: [facility];  Service: Orthopedics;  Laterality: Right;    OB History    No data available       Home Medications    Prior to Admission medications   Medication Sig Start Date End Date Taking? Authorizing Provider  bisacodyl (DULCOLAX) 10 MG suppository Place 10 mg rectally as needed for moderate constipation.   Yes [provider]  digoxin (LANOXIN) 0.125 MG tablet Take 1 tablet (0.125 mg total) by mouth daily. Patient taking differently: Take 0.125 mg by mouth See admin instructions. On Tuesday, Thursday, Saturday and Sunday 01/29/16  Yes Regalado, Belkys A, MD  digoxin (LANOXIN) 0.25 MG tablet Take 0.25 mg by mouth every Monday, Wednesday, and Friday.   Yes  [provider]  docusate sodium (COLACE) 100 MG capsule Take 1 capsule (100 mg total) by mouth 2 (two) times daily as needed for mild constipation. 01/28/16  Yes Regalado, Belkys A, MD  ferrous sulfate 325 (65 FE) MG tablet Take 325 mg by mouth daily with breakfast.    Yes [provider]  flecainide (TAMBOCOR) 50 MG tablet Take 50 mg by mouth 2 (two) times daily.   Yes [provider]  gabapentin (NEURONTIN) 600 MG tablet Take 600 mg by mouth 3 (three) times daily.   Yes [provider]  HYDROcodone-acetaminophen (NORCO/VICODIN) 5-325 MG tablet Take 1 tablet by mouth every 12 (twelve) hours as needed for severe pain. Patient taking differently: Take 1 tablet by mouth daily as needed for severe pain.  09/05/16  Yes Medina-Vargas, Monina C, NP  INCRUSE ELLIPTA 62.5 MCG/INH AEPB Inhale 1 puff into the lungs daily.  10/19/15  Yes [provider]  ketorolac (ACULAR) 0.4 % SOLN Place 1 drop into the right eye 4 (four) times daily. Due to stop on 10/06/16 09/22/16 10/06/16 Yes [provider]  levalbuterol Penne Lash) 0.63 MG/3ML nebulizer solution Take 0.63 mg by nebulization 2 (two) times daily.   Yes [provider]  levalbuterol (XOPENEX) 1.25 MG/3ML nebulizer solution Take 1.25 mg by nebulization every 6 (six) hours as needed for wheezing.   Yes [provider]  magnesium hydroxide (MILK OF MAGNESIA) 400 MG/5ML suspension Take 30 mLs by mouth daily as needed for mild constipation.   Yes [provider]  metoprolol succinate (TOPROL-XL) 25 MG 24 hr tablet Take 25 mg by mouth at bedtime.   Yes [provider]  mirtazapine (REMERON) 15 MG tablet Take 15 mg by mouth at bedtime.   Yes [provider]  naphazoline-glycerin (CLEAR EYES REDNESS RELIEF) 0.012-0.2 % SOLN Place 1 drop into both eyes 2 (two) times daily.   Yes [provider]  pantoprazole (PROTONIX) 40 MG tablet Take 40 mg by mouth daily.   Yes  [provider]  prednisoLONE acetate (PREDNISOLONE ACETATE P-F) 1 % ophthalmic suspension Place 1 drop into the left eye daily. Due to stop on 10/08/16 09/16/16 10/08/16 Yes [provider]  sertraline (ZOLOFT) 50 MG tablet Take 50 mg by mouth daily.   Yes [provider]  Sodium Phosphates (RA SALINE ENEMA) 19-7 GM/118ML ENEM Place 1 each rectally as needed (for constipation).   Yes [provider]  traZODone (DESYREL) 100 MG tablet Take 100 mg by mouth 2 (two) times daily.   Yes [provider]  UNABLE TO FIND Take 120 mLs by mouth 2 (two) times daily. Med Name: Med Pass   Yes [provider]  metoprolol tartrate (LOPRESSOR) 25 MG tablet Take 0.5 tablets (12.5 mg total) by mouth daily. Patient not taking: Reported on 10/03/2016 05/11/16   Lauree Chandler, NP    Family History Family History  Problem Relation Age of Onset  . Heart attack Mother   . Diabetes Father   . Thyroid disease Neg Hx  Social History Social History  Substance Use Topics  . Smoking status: Former Research scientist (life sciences)  . Smokeless tobacco: Never Used     Comment: Smoked age [age] up to < 1 ppd, usually half a pack per day, mainly one half pack per day  . Alcohol use No     Allergies   Patient has no known allergies.   Review of Systems Review of Systems  Constitutional: Negative for chills and fever.  HENT: Negative for facial swelling and sore throat.   Respiratory: Negative for shortness of breath.   Cardiovascular: Negative for chest pain.  Gastrointestinal: Negative for abdominal pain, nausea and vomiting.  Genitourinary: Negative for dysuria.  Musculoskeletal: Positive for arthralgias (L ankle). Negative for back pain.  Skin: Negative for rash and wound.  Neurological: Positive for weakness. Negative for headaches.  Psychiatric/Behavioral: The patient is not nervous/anxious.      Physical Exam Updated Vital Signs BP (!) 81/54   Pulse (!) 57   Temp  98.8 F (37.1 C) (Oral)   Resp (!) 22   SpO2 99%   Physical Exam  Constitutional: She appears well-developed and well-nourished. No distress.  HENT:  Head: Normocephalic and atraumatic.  Mouth/Throat: Oropharynx is clear and moist. No oropharyngeal exudate.  Eyes: Pupils are equal, round, and reactive to light. Conjunctivae are normal. Right eye exhibits no discharge. Left eye exhibits no discharge. No scleral icterus.  Neck: Normal range of motion. Neck supple. No thyromegaly present.  Cardiovascular: Normal rate, regular rhythm, normal heart sounds and intact distal pulses.  Exam reveals no gallop and no friction rub.   No murmur heard. Pulmonary/Chest: Effort normal and breath sounds normal. No stridor. No respiratory distress. She has no wheezes. She has no rales.  Abdominal: Soft. Bowel sounds are normal. She exhibits no distension. There is no tenderness. There is no rebound and no guarding.  Musculoskeletal: She exhibits no edema.  Splint on L ankle  Lymphadenopathy:    She has no cervical adenopathy.  Neurological: She is alert. Coordination normal.  CN 3-12 intact; normal sensation throughout; 5/5 strength in upper extremities and R lower, L limited due to splint on ankle; equal bilateral grip strength  Skin: Skin is warm and dry. No rash noted. She is not diaphoretic. No pallor.  Psychiatric: She has a normal mood and affect.  Nursing note and vitals reviewed.    ED Treatments / Results  Labs (all labs ordered are listed, but only abnormal results are displayed) Labs Reviewed  BASIC METABOLIC PANEL - Abnormal; Notable for the following:       Result Value   Chloride 100 (*)    Glucose, Bld 118 (*)    BUN 27 (*)    Creatinine, Ser 1.80 (*)    GFR calc non Af Amer 26 (*)    GFR calc Af Amer 30 (*)    All other components within normal limits  CBC WITH DIFFERENTIAL/PLATELET - Abnormal; Notable for the following:    RBC 3.54 (*)    Hemoglobin 10.8 (*)    HCT 35.3 (*)     Lymphs Abs 0.6 (*)    All other components within normal limits  URINALYSIS, ROUTINE W REFLEX MICROSCOPIC  TSH  I-STAT TROPONIN, ED    EKG  EKG Interpretation  Date/Time:  Monday October 03 2016 04:31:08 EDT Ventricular Rate:  66 PR Interval:    QRS Duration: 100 QT Interval:  393 QTC Calculation: 412 R Axis:   53 Text Interpretation:  Sinus rhythm  Low voltage, precordial leads Nonspecific repol abnormality, diffuse leads When compared with ECG of 10/02/2016, No significant change was found Confirmed by Delora Fuel ([id number]) on 10/03/2016 4:49:43 AM       Radiology Dg Tibia/fibula Left  Result Date: 10/02/2016 CLINICAL DATA:  [place].  Left leg pain. EXAM: LEFT TIBIA AND FIBULA - 2 VIEW COMPARISON:  None. FINDINGS: Bimalleolar ankle fractures are again demonstrated. No fractures of the tibia or fibular shaft. The knee joint is maintained. IMPRESSION: Bimalleolar ankle fractures. No fractures of the tibial or fibular shafts. Electronically Signed   By: Marijo Sanes M.D.   On: 10/02/2016 13:20   Dg Ankle Complete Left  Result Date: 10/02/2016 CLINICAL DATA:  [place].  Left ankle pain. EXAM: LEFT ANKLE COMPLETE - 3+ VIEW COMPARISON:  None. FINDINGS: Bimalleolar ankle fractures noted. There is an oblique coursing relatively nondisplaced distal fibular fracture at and above the level of the ankle mortise. There is also a transverse nondisplaced fracture involving the medial malleolus at the level of the ankle mortise. No fracture of the posterior tibia is identified. The tibiotalar and subtalar joints are maintained. No mid or hindfoot fractures are identified. IMPRESSION: Relatively nondisplaced bimalleolar ankle fractures. Electronically Signed   By: Marijo Sanes M.D.   On: 10/02/2016 13:19    Procedures Procedures (including critical care time)  Medications Ordered in ED Medications  sodium chloride 0.9 % bolus 500 mL (not administered)     Initial Impression / Assessment and  Plan / ED Course  I have reviewed the triage vital signs and the nursing notes.  Pertinent labs & imaging results that were available during my care of the patient were reviewed by me and considered in my medical decision making (see chart for details).     Patient with generalized weakness probably caused by AKI. BUN 27, Cr 1.80. CBC shows table chronic anemia, hemoglobin 10.8. Troponin 0.06. EKG shows NSR, no significant change since last tracing. Urine and TSH pending. I consulted [facility] and spoke with Dr. Marily Memos who will admit the patient for further evaluation and treatment. Patient also evaluated by Dr. Roxanne Mins who guided the patient's management and agrees with plan. Patient's blood pressures that are low were most likely not accurate as patient was sleeping and the cuff was bent in the crease of her arm. I moved the patient's arm straight for a reading and her pressure was 101/60, which is baseline for the patient per review of medical record.  I spoke with patient's daughter, Delene Ruffini, on the phone and updated her that she would be admitted. Her phone number is [phone] if needed for further communication.  Final Clinical Impressions(s) / ED Diagnoses   Final diagnoses:  Weakness  AKI (acute kidney injury) [facility])    New Prescriptions New Prescriptions   No medications on file     Caryl Ada 03/70/48 [id number]    Delora Fuel, MD 69/45/03 [phone]

## 2016-10-03 NOTE — ED Notes (Addendum)
Delene Ruffini. Daughter, [phone]   Work   [phone] to 8p

## 2016-10-03 NOTE — ED Notes (Signed)
Called pharmacy to send and verify medications

## 2016-10-03 NOTE — ED Notes (Signed)
Secretary informed this nurse that the patient had a telephone call (pts daughter); RN noted there was no phone in the room and asked the secretary to transfer phone call to handheld phone for patient to speak with her mother; there was apparently a loss of communication between the patient and daughter was lost and an attempt to transfer phone call to handheld phone was made again; Pt said her daughter would like to speak to the RN caring for her, I Sherlyn Lick) informed the daughter over the phone that her RN was in another patients room and would be glad to hand the phone to her RN when she was available; daughter began to complain about the care that her mother has/ has not received and wanted this RN Clarise Cruz) to research the patients electronic medical record for information regarding this patients care, this RN Clarise Cruz) told the daughter that it would be better if the primary RN of this patient communicated with the daughter as I know nothing about this patient or her care; Daughter began to cuss and get angry at this RN and this RN told the daughter that she would not stand to be cussed at and I would not speak over her, daughter continued to cuss and this RN hung up the phone on daughter, this RN told the secretary not to transfer her to our handheld phone again

## 2016-10-04 ENCOUNTER — Observation Stay (HOSPITAL_COMMUNITY): Payer: Medicare Other

## 2016-10-04 ENCOUNTER — Inpatient Hospital Stay (HOSPITAL_COMMUNITY): Payer: Medicare Other

## 2016-10-04 DIAGNOSIS — Z96641 Presence of right artificial hip joint: Secondary | ICD-10-CM | POA: Diagnosis present

## 2016-10-04 DIAGNOSIS — S82842D Displaced bimalleolar fracture of left lower leg, subsequent encounter for closed fracture with routine healing: Secondary | ICD-10-CM | POA: Diagnosis not present

## 2016-10-04 DIAGNOSIS — Z9981 Dependence on supplemental oxygen: Secondary | ICD-10-CM | POA: Diagnosis not present

## 2016-10-04 DIAGNOSIS — R410 Disorientation, unspecified: Secondary | ICD-10-CM | POA: Diagnosis not present

## 2016-10-04 DIAGNOSIS — Z87440 Personal history of urinary (tract) infections: Secondary | ICD-10-CM | POA: Diagnosis not present

## 2016-10-04 DIAGNOSIS — R0602 Shortness of breath: Secondary | ICD-10-CM | POA: Diagnosis not present

## 2016-10-04 DIAGNOSIS — I471 Supraventricular tachycardia: Secondary | ICD-10-CM | POA: Diagnosis not present

## 2016-10-04 DIAGNOSIS — G894 Chronic pain syndrome: Secondary | ICD-10-CM | POA: Diagnosis present

## 2016-10-04 DIAGNOSIS — J9691 Respiratory failure, unspecified with hypoxia: Secondary | ICD-10-CM | POA: Diagnosis not present

## 2016-10-04 DIAGNOSIS — N179 Acute kidney failure, unspecified: Secondary | ICD-10-CM | POA: Diagnosis not present

## 2016-10-04 DIAGNOSIS — G629 Polyneuropathy, unspecified: Secondary | ICD-10-CM | POA: Diagnosis not present

## 2016-10-04 DIAGNOSIS — J80 Acute respiratory distress syndrome: Secondary | ICD-10-CM | POA: Diagnosis not present

## 2016-10-04 DIAGNOSIS — F329 Major depressive disorder, single episode, unspecified: Secondary | ICD-10-CM | POA: Diagnosis present

## 2016-10-04 DIAGNOSIS — F419 Anxiety disorder, unspecified: Secondary | ICD-10-CM | POA: Diagnosis present

## 2016-10-04 DIAGNOSIS — I5032 Chronic diastolic (congestive) heart failure: Secondary | ICD-10-CM | POA: Diagnosis not present

## 2016-10-04 DIAGNOSIS — J441 Chronic obstructive pulmonary disease with (acute) exacerbation: Secondary | ICD-10-CM | POA: Diagnosis not present

## 2016-10-04 DIAGNOSIS — B9620 Unspecified Escherichia coli [E. coli] as the cause of diseases classified elsewhere: Secondary | ICD-10-CM | POA: Diagnosis not present

## 2016-10-04 DIAGNOSIS — J9601 Acute respiratory failure with hypoxia: Secondary | ICD-10-CM | POA: Diagnosis present

## 2016-10-04 DIAGNOSIS — R531 Weakness: Secondary | ICD-10-CM | POA: Diagnosis not present

## 2016-10-04 DIAGNOSIS — M4800 Spinal stenosis, site unspecified: Secondary | ICD-10-CM | POA: Diagnosis present

## 2016-10-04 DIAGNOSIS — R2689 Other abnormalities of gait and mobility: Secondary | ICD-10-CM | POA: Diagnosis not present

## 2016-10-04 DIAGNOSIS — N390 Urinary tract infection, site not specified: Secondary | ICD-10-CM | POA: Diagnosis not present

## 2016-10-04 DIAGNOSIS — Z8249 Family history of ischemic heart disease and other diseases of the circulatory system: Secondary | ICD-10-CM | POA: Diagnosis not present

## 2016-10-04 DIAGNOSIS — M4802 Spinal stenosis, cervical region: Secondary | ICD-10-CM | POA: Diagnosis not present

## 2016-10-04 DIAGNOSIS — R05 Cough: Secondary | ICD-10-CM | POA: Diagnosis not present

## 2016-10-04 DIAGNOSIS — K219 Gastro-esophageal reflux disease without esophagitis: Secondary | ICD-10-CM | POA: Diagnosis present

## 2016-10-04 DIAGNOSIS — J449 Chronic obstructive pulmonary disease, unspecified: Secondary | ICD-10-CM | POA: Diagnosis not present

## 2016-10-04 DIAGNOSIS — G9341 Metabolic encephalopathy: Secondary | ICD-10-CM | POA: Diagnosis present

## 2016-10-04 DIAGNOSIS — Z23 Encounter for immunization: Secondary | ICD-10-CM | POA: Diagnosis not present

## 2016-10-04 DIAGNOSIS — M6281 Muscle weakness (generalized): Secondary | ICD-10-CM | POA: Diagnosis not present

## 2016-10-04 DIAGNOSIS — R4182 Altered mental status, unspecified: Secondary | ICD-10-CM | POA: Diagnosis not present

## 2016-10-04 DIAGNOSIS — I130 Hypertensive heart and chronic kidney disease with heart failure and stage 1 through stage 4 chronic kidney disease, or unspecified chronic kidney disease: Secondary | ICD-10-CM | POA: Diagnosis present

## 2016-10-04 DIAGNOSIS — R488 Other symbolic dysfunctions: Secondary | ICD-10-CM | POA: Diagnosis not present

## 2016-10-04 DIAGNOSIS — D539 Nutritional anemia, unspecified: Secondary | ICD-10-CM | POA: Diagnosis present

## 2016-10-04 DIAGNOSIS — S82892D Other fracture of left lower leg, subsequent encounter for closed fracture with routine healing: Secondary | ICD-10-CM | POA: Diagnosis not present

## 2016-10-04 DIAGNOSIS — S82842A Displaced bimalleolar fracture of left lower leg, initial encounter for closed fracture: Secondary | ICD-10-CM | POA: Diagnosis present

## 2016-10-04 DIAGNOSIS — N183 Chronic kidney disease, stage 3 (moderate): Secondary | ICD-10-CM | POA: Diagnosis present

## 2016-10-04 DIAGNOSIS — Z833 Family history of diabetes mellitus: Secondary | ICD-10-CM | POA: Diagnosis not present

## 2016-10-04 LAB — CBC
HCT: 31.6 % — ABNORMAL LOW (ref 36.0–46.0)
HEMOGLOBIN: 9.4 g/dL — AB (ref 12.0–15.0)
MCH: 29.9 pg (ref 26.0–34.0)
MCHC: 29.7 g/dL — AB (ref 30.0–36.0)
MCV: 100.6 fL — ABNORMAL HIGH (ref 78.0–100.0)
PLATELETS: 130 10*3/uL — AB (ref 150–400)
RBC: 3.14 MIL/uL — ABNORMAL LOW (ref 3.87–5.11)
RDW: 14.6 % (ref 11.5–15.5)
WBC: 6.1 10*3/uL (ref 4.0–10.5)

## 2016-10-04 LAB — BASIC METABOLIC PANEL
Anion gap: 5 (ref 5–15)
BUN: 21 mg/dL — ABNORMAL HIGH (ref 6–20)
CALCIUM: 8.3 mg/dL — AB (ref 8.9–10.3)
CHLORIDE: 106 mmol/L (ref 101–111)
CO2: 30 mmol/L (ref 22–32)
CREATININE: 1.22 mg/dL — AB (ref 0.44–1.00)
GFR, EST AFRICAN AMERICAN: 48 mL/min — AB (ref >60)
GFR, EST NON AFRICAN AMERICAN: 42 mL/min — AB (ref >60)
Glucose, Bld: 92 mg/dL (ref 65–99)
Potassium: 4.4 mmol/L (ref 3.5–5.1)
SODIUM: 141 mmol/L (ref 135–145)

## 2016-10-04 LAB — MRSA PCR SCREENING: MRSA by PCR: NEGATIVE

## 2016-10-04 MED ORDER — HYDROCODONE-ACETAMINOPHEN 5-325 MG PO TABS
1.0000 | ORAL_TABLET | Freq: Four times a day (QID) | ORAL | Status: DC | PRN
  Administered 2016-10-04 – 2016-10-07 (×6): 1 via ORAL
  Filled 2016-10-04 (×6): qty 1

## 2016-10-04 MED ORDER — ARFORMOTEROL TARTRATE 15 MCG/2ML IN NEBU
15.0000 ug | INHALATION_SOLUTION | Freq: Two times a day (BID) | RESPIRATORY_TRACT | Status: DC
  Administered 2016-10-04 – 2016-10-07 (×6): 15 ug via RESPIRATORY_TRACT
  Filled 2016-10-04 (×7): qty 2

## 2016-10-04 MED ORDER — PREDNISOLONE ACETATE 1 % OP SUSP: 1.0000 [drp] | Freq: Every day | OPHTHALMIC | Status: DC

## 2016-10-04 MED ORDER — LEVALBUTEROL HCL 1.25 MG/0.5ML IN NEBU: 1.2500 mg | INHALATION_SOLUTION | RESPIRATORY_TRACT | Status: DC | PRN

## 2016-10-04 MED ORDER — PREDNISOLONE ACETATE 1 % OP SUSP
1.0000 [drp] | Freq: Three times a day (TID) | OPHTHALMIC | Status: DC
  Administered 2016-10-07: 1 [drp] via OPHTHALMIC
  Filled 2016-10-04: qty 1

## 2016-10-04 MED ORDER — BUDESONIDE 0.5 MG/2ML IN SUSP
0.5000 mg | Freq: Two times a day (BID) | RESPIRATORY_TRACT | Status: DC
  Administered 2016-10-04 – 2016-10-07 (×6): 0.5 mg via RESPIRATORY_TRACT
  Filled 2016-10-04 (×7): qty 2

## 2016-10-04 MED ORDER — PREDNISOLONE ACETATE 1 % OP SUSP: 1.0000 [drp] | Freq: Two times a day (BID) | OPHTHALMIC | Status: DC

## 2016-10-04 MED ORDER — KETOROLAC TROMETHAMINE 0.5 % OP SOLN
1.0000 [drp] | Freq: Four times a day (QID) | OPHTHALMIC | Status: AC
  Administered 2016-10-04 – 2016-10-06 (×11): 1 [drp] via OPHTHALMIC

## 2016-10-04 MED ORDER — PREDNISOLONE ACETATE 1 % OP SUSP
1.0000 [drp] | Freq: Every day | OPHTHALMIC | Status: DC
  Administered 2016-10-05 – 2016-10-07 (×3): 1 [drp] via OPHTHALMIC

## 2016-10-04 NOTE — Progress Notes (Signed)
Pt admitted to [facility] with AMS/UTI. On arrival to unit pt is alert and oriented x4. Pt placed on tele, in NSR. Vss. Pt educated on unit and equipment. WCTM

## 2016-10-04 NOTE — Progress Notes (Signed)
PROGRESS NOTE [facility]   Dawn [name]   AVW:[id number] DOB: 07/08/1940  DOA: 10/03/2016 PCP: [name], MD   Brief Narrative:  Dawn [name] a 76 year old female with medical history significant for COPD, anxiety, depression and recurrent UTIs. Was sent by her nursing home for somnolence and hypoxemia. At her facility she was found to be hypoxic with O2 saturation around the 70s she was administered 4 L of nasal cannula with improvement of O2 saturation to 90%. Patient report no recollection of this event. Upon ED evaluation UA was found to be grossly of normal and she was admitted for UTI and IV antibiotics.  Subjective: Patient seen and examined, she report shortness of breath, wheezing and chest tightness that started this morning upon awaking. She's also complaining of headaches. Denies dysuria and frequency.  Assessment & Plan: Acute metabolic encephalopathy likely secondary to hypoxia and UTI.  Obtain CT head - was not ordered on the ED  Continue abx - ceftriaxone  No urine cultures obtained on admission, send add on - if not able to collect treat with abx for 5 -7 days.  O2 as needed   COPD exacerbation  Wheezing and hypoxic on my exam Start pulmicort and brovana  Continue Xopenex  O2 wean as tolerated keep O2 sat > 89%  AKI  Cr upon admission 1.8 - baseline 1.0 Treated with IVF - Cr down to 1.2  IVF has been d/ced given respiratory distress  Monitor Cr in AM   L ankle fracture  9/23 - outpatient follow up with Dr Alvan Dame  Hx of SVT - stable  Continue digoxin  See cardiology Dr Curt Bears as outpatient  Monitor   DVT prophylaxis: Heparin  Code Status: Full  Family Communication: None at bedside  Disposition Plan: Home when medically stable, hopefully next 48 hrs   Consultants:   None   Procedures:   None   Antimicrobials:  Rocephin 10/03/16 >>   Objective: Vitals:   10/03/16 1855 10/03/16 2030 10/04/16 0510 10/04/16 0822    BP: (!) 116/55 (!) 128/56 121/60   Pulse: 61 64 68 69  Resp: 18 16 20 20   Temp:  98.6 F (37 C) 97.9 F (36.6 C)   TempSrc:  Oral Oral   SpO2: 95% 100% 100% 100%    Intake/Output Summary (Last 24 hours) at 10/04/16 1011 Last data filed at 10/04/16 0600  Gross per 24 hour  Intake             1450 ml  Output              200 ml  Net             1250 ml   There were no vitals filed for this visit.  Examination:  General exam: Mild distress, tachypnea     HEENT: AC/AT, PERRLA, OP moist and clear Respiratory system: Decrease air entry b/l, diffuse wheezing, use accessory muscles  Cardiovascular system: S1 & S2 heard, RRR. No JVD, murmurs, rubs or gallops Gastrointestinal system: Abdomen is nondistended, soft and nontender.  Central nervous system: Alert and oriented x 3. No focal neurological deficits. Extremities: No pedal edema. Skin: No rashes Psychiatry: Mood & affect appropriate flat   Data Reviewed: I have personally reviewed following labs and imaging studies  CBC:  Recent Labs Lab 10/03/16 0457 10/04/16 0659  WBC 8.1 6.1  NEUTROABS 6.9  --   HGB 10.8* 9.4*  HCT 35.3* 31.6*  MCV 99.7 100.6*  PLT 168  102*   Basic Metabolic Panel:  Recent Labs Lab 10/03/16 0457 10/04/16 0659  NA 138 141  K 5.0 4.4  CL 100* 106  CO2 26 30  GLUCOSE 118* 92  BUN 27* 21*  CREATININE 1.80* 1.22*  CALCIUM 8.9 8.3*   GFR: CrCl cannot be calculated (Unknown ideal weight.). Liver Function Tests: No results for input(s): AST, ALT, ALKPHOS, BILITOT, PROT, ALBUMIN in the last 168 hours. No results for input(s): LIPASE, AMYLASE in the last 168 hours. No results for input(s): AMMONIA in the last 168 hours. Coagulation Profile: No results for input(s): INR, PROTIME in the last 168 hours. Cardiac Enzymes: No results for input(s): CKTOTAL, CKMB, CKMBINDEX, TROPONINI in the last 168 hours. BNP (last 3 results) No results for input(s): PROBNP in the last 8760 hours. HbA1C: No  results for input(s): HGBA1C in the last 72 hours. CBG: No results for input(s): GLUCAP in the last 168 hours. Lipid Profile: No results for input(s): CHOL, HDL, LDLCALC, TRIG, CHOLHDL, LDLDIRECT in the last 72 hours. Thyroid Function Tests:  Recent Labs  10/03/16 0932  TSH 2.991   Anemia Panel: No results for input(s): VITAMINB12, FOLATE, FERRITIN, TIBC, IRON, RETICCTPCT in the last 72 hours. Sepsis Labs: No results for input(s): PROCALCITON, LATICACIDVEN in the last 168 hours.  Recent Results (from the past 240 hour(s))  MRSA PCR Screening     Status: None   Collection Time: 10/03/16 11:35 PM  Result Value Ref Range Status   MRSA by PCR NEGATIVE NEGATIVE Final    Comment:        The GeneXpert MRSA Assay (FDA approved for NASAL specimens only), is one component of a comprehensive MRSA colonization surveillance program. It is not intended to diagnose MRSA infection nor to guide or monitor treatment for MRSA infections.       Radiology Studies: Dg Tibia/fibula Left  Result Date: 10/02/2016 CLINICAL DATA:  [place].  Left leg pain. EXAM: LEFT TIBIA AND FIBULA - 2 VIEW COMPARISON:  None. FINDINGS: Bimalleolar ankle fractures are again demonstrated. No fractures of the tibia or fibular shaft. The knee joint is maintained. IMPRESSION: Bimalleolar ankle fractures. No fractures of the tibial or fibular shafts. Electronically Signed   By: Marijo Sanes M.D.   On: 10/02/2016 13:20   Dg Ankle Complete Left  Result Date: 10/02/2016 CLINICAL DATA:  [place].  Left ankle pain. EXAM: LEFT ANKLE COMPLETE - 3+ VIEW COMPARISON:  None. FINDINGS: Bimalleolar ankle fractures noted. There is an oblique coursing relatively nondisplaced distal fibular fracture at and above the level of the ankle mortise. There is also a transverse nondisplaced fracture involving the medial malleolus at the level of the ankle mortise. No fracture of the posterior tibia is identified. The tibiotalar and subtalar joints are  maintained. No mid or hindfoot fractures are identified. IMPRESSION: Relatively nondisplaced bimalleolar ankle fractures. Electronically Signed   By: Marijo Sanes M.D.   On: 10/02/2016 13:19      Scheduled Meds: . arformoterol  15 mcg Nebulization BID  . budesonide (PULMICORT) nebulizer solution  0.5 mg Nebulization BID  . digoxin  0.125 mg Oral Q T,Th,S,Su  . digoxin  0.25 mg Oral Q M,W,F  . flecainide  50 mg Oral BID  . gabapentin  600 mg Oral TID  . heparin  5,000 Units Subcutaneous Q8H  . Influenza vac split quadrivalent PF  0.5 mL Intramuscular Tomorrow-1000  . ketorolac  1 drop Right Eye QID  . mirtazapine  15 mg Oral QHS  . naphazoline-glycerin  1 drop Both Eyes BID  . pantoprazole  40 mg Oral Daily  . prednisoLONE acetate  1 drop Left Eye Daily  . sertraline  50 mg Oral Daily  . umeclidinium bromide  1 puff Inhalation Daily   Continuous Infusions: . cefTRIAXone (ROCEPHIN)  IV Stopped (10/03/16 1016)     LOS: 0 days    Time spent: Total of 35 minutes spent with pt, greater than 50% of which was spent in discussion of  treatment, counseling and coordination of care    Chipper Oman, MD Pager: Text Page via [email or web address]   If 7PM-7AM, please contact night-coverage [email or web address] 10/04/2016, 10:11 AM

## 2016-10-04 NOTE — Care Management Obs Status (Signed)
[place] NOTIFICATION   Patient Details  Name: ARIEN MORINE MRN: [id number] Date of Birth: 12-08-1940   Medicare Observation Status Notification Given:  Yes    Carles Collet, RN 10/04/2016, 11:51 AM

## 2016-10-05 DIAGNOSIS — I5032 Chronic diastolic (congestive) heart failure: Secondary | ICD-10-CM

## 2016-10-05 DIAGNOSIS — R410 Disorientation, unspecified: Secondary | ICD-10-CM

## 2016-10-05 DIAGNOSIS — N179 Acute kidney failure, unspecified: Secondary | ICD-10-CM

## 2016-10-05 DIAGNOSIS — I471 Supraventricular tachycardia: Secondary | ICD-10-CM

## 2016-10-05 DIAGNOSIS — R531 Weakness: Secondary | ICD-10-CM

## 2016-10-05 DIAGNOSIS — R4182 Altered mental status, unspecified: Secondary | ICD-10-CM

## 2016-10-05 DIAGNOSIS — R0602 Shortness of breath: Secondary | ICD-10-CM

## 2016-10-05 DIAGNOSIS — J441 Chronic obstructive pulmonary disease with (acute) exacerbation: Secondary | ICD-10-CM

## 2016-10-05 DIAGNOSIS — G629 Polyneuropathy, unspecified: Secondary | ICD-10-CM

## 2016-10-05 DIAGNOSIS — S82892D Other fracture of left lower leg, subsequent encounter for closed fracture with routine healing: Secondary | ICD-10-CM

## 2016-10-05 LAB — BASIC METABOLIC PANEL
ANION GAP: 8 (ref 5–15)
BUN: 18 mg/dL (ref 6–20)
CO2: 29 mmol/L (ref 22–32)
Calcium: 8.5 mg/dL — ABNORMAL LOW (ref 8.9–10.3)
Chloride: 102 mmol/L (ref 101–111)
Creatinine, Ser: 1.15 mg/dL — ABNORMAL HIGH (ref 0.44–1.00)
GFR calc Af Amer: 52 mL/min — ABNORMAL LOW (ref >60)
GFR, EST NON AFRICAN AMERICAN: 45 mL/min — AB (ref >60)
Glucose, Bld: 98 mg/dL (ref 65–99)
POTASSIUM: 4.9 mmol/L (ref 3.5–5.1)
Sodium: 139 mmol/L (ref 135–145)

## 2016-10-05 LAB — CBC
HEMATOCRIT: 32.2 % — AB (ref 36.0–46.0)
HEMOGLOBIN: 9.7 g/dL — AB (ref 12.0–15.0)
MCH: 30.1 pg (ref 26.0–34.0)
MCHC: 30.1 g/dL (ref 30.0–36.0)
MCV: 100 fL (ref 78.0–100.0)
Platelets: 158 10*3/uL (ref 150–400)
RBC: 3.22 MIL/uL — ABNORMAL LOW (ref 3.87–5.11)
RDW: 14.4 % (ref 11.5–15.5)
WBC: 7.3 10*3/uL (ref 4.0–10.5)

## 2016-10-05 NOTE — Progress Notes (Signed)
PROGRESS NOTE    Dawn [name]  ZOX:[id number] DOB: 1940/09/21 DOA: 10/03/2016 PCP: [name], MD  Brief Narrative:  Dawn [name] a 76 year old female with medical history significant for COPD, anxiety, depression and recurrent UTIs. Was sent by her nursing home for somnolence and hypoxemia. At her facility she was found to be hypoxic with O2 saturation around the 70s she was administered 4 L of nasal cannula with improvement of O2 saturation to 90%. Patient report no recollection of this event. Upon ED evaluation UA was found to be grossly of normal and she was admitted for UTI and IV antibiotics. UTI showed >100,000 CFU of E Coli. Patient also found to have a COPD Exacerbation.   Assessment & Plan:   Active Problems:   AKI (acute kidney injury) ([place])   COPD (chronic obstructive pulmonary disease) (HCC)   Delirium   SVT (supraventricular tachycardia) (HCC)   Altered mental status   Ankle fracture   Neuropathy   Chronic diastolic CHF (congestive heart failure) (HCC)  Acute metabolic encephalopathy likely secondary to Hypoxia and E Coli UTI with concern for Polypharmacy   -Obtained CT head - was not ordered on the ED; Showed No CT evidence for acute intracranial abnormality. Atrophy and small vessel ischemic changes of the white matter  -Continue abx - Ceftriaxone  -No urine cultures obtained on admission, sent add on and showing >100,000 CFU of E. Coli - Sensitivities Pending  -O2 as needed  -May need to cut down Narcotics and Gabapentin dose  COPD Exacerbation  -Wheezing and Hypoxia yesterday but not as much  -C/w Budesonide 0.5 mg Neb BID and Arformoterol 15 mcg Neb BID -Continue Xopenex 1.25 mg Neb q4hprn -C/w Supplemental O2 and wean as tolerated; keep O2 sat > 89%  Acute Respiratory Failure with Hypoxia 2/2 to COPD -As Above -Repeat CXR in AM -Wean O2 as Tolerated -Check Pulse Ox while Ambulating in AM   AKI  -Cr upon admission 1.8 - baseline  1.0 -Treated with IVF - Cr down to 1.15 -IVF has been d/ced given respiratory distress  -Continue to Monitor and Repeat CMP in AM    L Ankle Fracture  9/23 - outpatient follow up with Dr Alvan Dame -May need to cut down on Gabapentin dose -Pain Control with Acetaminophen 650 mg po q6hprn and Hydrocodone-Acetaminophen 1 tab po q6hprn  -PT/OT Eval and Treat   Hx of SVT - stable  -Continue Digoxin 0.125 mg T-Th-S-Su and 0.25 mg po M-W-F and -C/w Flecanide 50 mg po BID -Sees cardiology Dr Curt Bears as outpatient  -C/w Telemetry and Continue to Monitor   Neuropathy and Hx of Spinal Stenosis especially in Neck  - continue Gabapentin 600 mg po TID but may reduce dose given recent falls    Depression/Anxiety - Continue Mirtazapine 15 mg po daily and Sertraline 50 mg po Daily  GERD: - Continue PPI Pantoprazole 40 mg Daily  DVT prophylaxis: Heparin 5,000 units sq q8h Code Status: FULL CODE Family Communication: No family present at bedside Disposition Plan: Pending PT Evaluation  Consultants:   None   Procedures: None   Antimicrobials:  Anti-infectives    Start     Dose/Rate Route Frequency Ordered Stop   10/03/16 1000  cefTRIAXone (ROCEPHIN) 1 g in dextrose 5 % 50 mL IVPB     1 g 100 mL/hr over 30 Minutes Intravenous Every 24 hours 10/03/16 0926       Subjective: Seen and examined at beside and was complaining of a headache.  No CP or SOB. States she does not know how she fell. No other concerns or complaints at this time.   Objective: Vitals:   10/04/16 2108 10/04/16 2150 10/04/16 2328 10/05/16 0650  BP: (!) 114/55   116/78  Pulse: 89   74  Resp: 19   18  Temp: 100 F (37.8 C)  99 F (37.2 C) 99.6 F (37.6 C)  TempSrc: Oral  Oral Oral  SpO2: (!) 74% 93%  97%  Weight:      Height:        Intake/Output Summary (Last 24 hours) at 10/05/16 0746 Last data filed at 10/04/16 1331  Gross per 24 hour  Intake              240 ml  Output              700 ml  Net              -460 ml   Filed Weights   10/04/16 1328  Weight: 71.7 kg (158 lb)   Examination: Physical Exam:  Constitutional:  NAD and appears calm and comfortable Eyes: Lids and conjunctivae normal, sclerae anicteric  ENMT: External Ears, Nose appear normal. Grossly normal hearing. Mucous membranes are moist.  Neck: Appears normal, supple, no cervical masses, normal ROM, no appreciable thyromegaly, no JVD Respiratory: Diminished to auscultation bilaterally, no wheezing, rales, rhonchi or crackles. Normal respiratory effort and patient is not tachypenic. No accessory muscle use.  Cardiovascular: RRR, no murmurs / rubs / gallops. S1 and S2 auscultated. No extremity edema.   Abdomen: Soft, non-tender, non-distended. No masses palpated. No appreciable hepatosplenomegaly. Bowel sounds positive.  GU: Deferred. Musculoskeletal: Left leg in Cast from Ankle Fx.  Skin: No rashes, lesions, ulcers on a limited skin eval. No induration; Warm and dry.  Neurologic: CN 2-12 grossly intact with no focal deficits. Sensation intact in all 4 Extremities. Strength 5/5 in all 4. Romberg sign cerebellar reflexes not assessed.  Psychiatric: Normal judgment and insight. Alert and oriented x 3. Normal mood and appropriate affect.   Data Reviewed: I have personally reviewed following labs and imaging studies  CBC:  Recent Labs Lab 10/03/16 0457 10/04/16 0659 10/05/16 0342  WBC 8.1 6.1 7.3  NEUTROABS 6.9  --   --   HGB 10.8* 9.4* 9.7*  HCT 35.3* 31.6* 32.2*  MCV 99.7 100.6* 100.0  PLT 168 130* 665   Basic Metabolic Panel:  Recent Labs Lab 10/03/16 0457 10/04/16 0659 10/05/16 0342  NA 138 141 139  K 5.0 4.4 4.9  CL 100* 106 102  CO2 26 30 29   GLUCOSE 118* 92 98  BUN 27* 21* 18  CREATININE 1.80* 1.22* 1.15*  CALCIUM 8.9 8.3* 8.5*   GFR: Estimated Creatinine Clearance: 41.3 mL/min (A) (by C-G formula based on SCr of 1.15 mg/dL (H)). Liver Function Tests: No results for input(s): AST, ALT, ALKPHOS,  BILITOT, PROT, ALBUMIN in the last 168 hours. No results for input(s): LIPASE, AMYLASE in the last 168 hours. No results for input(s): AMMONIA in the last 168 hours. Coagulation Profile: No results for input(s): INR, PROTIME in the last 168 hours. Cardiac Enzymes: No results for input(s): CKTOTAL, CKMB, CKMBINDEX, TROPONINI in the last 168 hours. BNP (last 3 results) No results for input(s): PROBNP in the last 8760 hours. HbA1C: No results for input(s): HGBA1C in the last 72 hours. CBG: No results for input(s): GLUCAP in the last 168 hours. Lipid Profile: No results for input(s): CHOL, HDL, LDLCALC,  TRIG, CHOLHDL, LDLDIRECT in the last 72 hours. Thyroid Function Tests:  Recent Labs  10/03/16 0932  TSH 2.991   Anemia Panel: No results for input(s): VITAMINB12, FOLATE, FERRITIN, TIBC, IRON, RETICCTPCT in the last 72 hours. Sepsis Labs: No results for input(s): PROCALCITON, LATICACIDVEN in the last 168 hours.  Recent Results (from the past 240 hour(s))  MRSA PCR Screening     Status: None   Collection Time: 10/03/16 11:35 PM  Result Value Ref Range Status   MRSA by PCR NEGATIVE NEGATIVE Final    Comment:        The GeneXpert MRSA Assay (FDA approved for NASAL specimens only), is one component of a comprehensive MRSA colonization surveillance program. It is not intended to diagnose MRSA infection nor to guide or monitor treatment for MRSA infections.     Radiology Studies: Ct Head Wo Contrast  Result Date: 10/04/2016 CLINICAL DATA:  Metabolic encephalopathy EXAM: CT HEAD WITHOUT CONTRAST TECHNIQUE: Contiguous axial images were obtained from the base of the skull through the vertex without intravenous contrast. COMPARISON:  01/23/2016 FINDINGS: Brain: No acute territorial infarction, hemorrhage or intracranial mass is seen. Mild atrophy and small vessel ischemic changes of the white matter. Stable ventricle size. Vascular: No hyperdense vessels.  Carotid artery  calcifications. Skull: No fracture or suspicious bone lesion. Sinuses/Orbits: Mild mucosal thickening in the ethmoid sinuses. Fluid level in the left maxillary sinus. No acute orbital abnormality. Bilateral lens extraction. Other: None IMPRESSION: 1. No CT evidence for acute intracranial abnormality. 2. Atrophy and small vessel ischemic changes of the white matter. Electronically Signed   By: Donavan Foil M.D.   On: 10/04/2016 18:52   Dg Chest Port 1 View  Result Date: 10/04/2016 CLINICAL DATA:  Shortness of breath, cough, congestion EXAM: PORTABLE CHEST 1 VIEW COMPARISON:  01/23/2016 FINDINGS: There is hyperinflation of the lungs compatible with COPD. Cardiomegaly with vascular congestion and interstitial prominence, likely mild interstitial edema. No confluent opacities or effusions. IMPRESSION: COPD/hyperinflation. Cardiomegaly.  Suspect mild interstitial edema. Electronically Signed   By: Rolm Baptise M.D.   On: 10/04/2016 10:41   Scheduled Meds: . arformoterol  15 mcg Nebulization BID  . budesonide (PULMICORT) nebulizer solution  0.5 mg Nebulization BID  . digoxin  0.125 mg Oral Q T,Th,S,Su  . digoxin  0.25 mg Oral Q M,W,F  . flecainide  50 mg Oral BID  . gabapentin  600 mg Oral TID  . heparin  5,000 Units Subcutaneous Q8H  . ketorolac  1 drop Right Eye QID  . mirtazapine  15 mg Oral QHS  . naphazoline-glycerin  1 drop Both Eyes BID  . pantoprazole  40 mg Oral Daily  . prednisoLONE acetate  1 drop Left Eye Daily  . [START ON 10/07/2016] prednisoLONE acetate  1 drop Right Eye TID   Followed by  . [START ON 10/14/2016] prednisoLONE acetate  1 drop Right Eye BID   Followed by  . [START ON 10/21/2016] prednisoLONE acetate  1 drop Right Eye Daily  . sertraline  50 mg Oral Daily  . umeclidinium bromide  1 puff Inhalation Daily   Continuous Infusions: . cefTRIAXone (ROCEPHIN)  IV Stopped (10/04/16 1114)    LOS: 1 day   Kerney Elbe, DO [facility] Pager [phone]  If  7PM-7AM, please contact night-coverage [email or web address] Password [email or web address] 10/05/2016, 7:46 AM

## 2016-10-06 DIAGNOSIS — B9620 Unspecified Escherichia coli [E. coli] as the cause of diseases classified elsewhere: Secondary | ICD-10-CM

## 2016-10-06 DIAGNOSIS — N390 Urinary tract infection, site not specified: Secondary | ICD-10-CM

## 2016-10-06 LAB — CBC WITH DIFFERENTIAL/PLATELET
BASOS ABS: 0 10*3/uL (ref 0.0–0.1)
BASOS PCT: 0 %
EOS ABS: 0.4 10*3/uL (ref 0.0–0.7)
EOS PCT: 9 %
HCT: 29 % — ABNORMAL LOW (ref 36.0–46.0)
Hemoglobin: 8.8 g/dL — ABNORMAL LOW (ref 12.0–15.0)
LYMPHS PCT: 22 %
Lymphs Abs: 1.1 10*3/uL (ref 0.7–4.0)
MCH: 30.4 pg (ref 26.0–34.0)
MCHC: 30.3 g/dL (ref 30.0–36.0)
MCV: 100.3 fL — AB (ref 78.0–100.0)
MONO ABS: 0.6 10*3/uL (ref 0.1–1.0)
Monocytes Relative: 12 %
Neutro Abs: 2.9 10*3/uL (ref 1.7–7.7)
Neutrophils Relative %: 57 %
Platelets: 142 10*3/uL — ABNORMAL LOW (ref 150–400)
RBC: 2.89 MIL/uL — ABNORMAL LOW (ref 3.87–5.11)
RDW: 14.3 % (ref 11.5–15.5)
WBC: 5.1 10*3/uL (ref 4.0–10.5)

## 2016-10-06 LAB — OCCULT BLOOD X 1 CARD TO LAB, STOOL
FECAL OCCULT BLD: NEGATIVE
Fecal Occult Bld: NEGATIVE
Fecal Occult Bld: NEGATIVE

## 2016-10-06 LAB — URINE CULTURE: Culture: >=100000 — AB

## 2016-10-06 LAB — COMPREHENSIVE METABOLIC PANEL
ALK PHOS: 88 U/L (ref 38–126)
ALT: 16 U/L (ref 14–54)
AST: 22 U/L (ref 15–41)
Albumin: 2.7 g/dL — ABNORMAL LOW (ref 3.5–5.0)
Anion gap: 6 (ref 5–15)
BUN: 23 mg/dL — AB (ref 6–20)
CALCIUM: 8.4 mg/dL — AB (ref 8.9–10.3)
CO2: 32 mmol/L (ref 22–32)
CREATININE: 1.54 mg/dL — AB (ref 0.44–1.00)
Chloride: 102 mmol/L (ref 101–111)
GFR calc non Af Amer: 31 mL/min — ABNORMAL LOW (ref >60)
GFR, EST AFRICAN AMERICAN: 36 mL/min — AB (ref >60)
Glucose, Bld: 90 mg/dL (ref 65–99)
Potassium: 4.2 mmol/L (ref 3.5–5.1)
SODIUM: 140 mmol/L (ref 135–145)
Total Bilirubin: 0.7 mg/dL (ref 0.3–1.2)
Total Protein: 6.4 g/dL — ABNORMAL LOW (ref 6.5–8.1)

## 2016-10-06 LAB — TYPE AND SCREEN
ABO/RH(D): O POS
ANTIBODY SCREEN: NEGATIVE

## 2016-10-06 LAB — BASIC METABOLIC PANEL
ANION GAP: 8 (ref 5–15)
BUN: 23 mg/dL — ABNORMAL HIGH (ref 6–20)
CALCIUM: 8 mg/dL — AB (ref 8.9–10.3)
CHLORIDE: 102 mmol/L (ref 101–111)
CO2: 31 mmol/L (ref 22–32)
Creatinine, Ser: 1.36 mg/dL — ABNORMAL HIGH (ref 0.44–1.00)
GFR calc non Af Amer: 36 mL/min — ABNORMAL LOW (ref >60)
GFR, EST AFRICAN AMERICAN: 42 mL/min — AB (ref >60)
Glucose, Bld: 104 mg/dL — ABNORMAL HIGH (ref 65–99)
POTASSIUM: 4 mmol/L (ref 3.5–5.1)
Sodium: 141 mmol/L (ref 135–145)

## 2016-10-06 LAB — PHOSPHORUS: Phosphorus: 3 mg/dL (ref 2.5–4.6)

## 2016-10-06 LAB — MAGNESIUM: MAGNESIUM: 2 mg/dL (ref 1.7–2.4)

## 2016-10-06 LAB — DIGOXIN LEVEL: Digoxin Level: 0.7 ng/mL — ABNORMAL LOW (ref 0.8–2.0)

## 2016-10-06 MED ORDER — SODIUM CHLORIDE 0.9 % IV SOLN
INTRAVENOUS | Status: DC
Start: 2016-10-06 — End: 2016-10-07
  Administered 2016-10-06 – 2016-10-07 (×2): via INTRAVENOUS

## 2016-10-06 MED ORDER — ENSURE ENLIVE PO LIQD
237.0000 mL | Freq: Two times a day (BID) | ORAL | Status: DC
  Administered 2016-10-06 – 2016-10-07 (×2): 237 mL via ORAL

## 2016-10-06 MED ORDER — GABAPENTIN 300 MG PO CAPS
300.0000 mg | ORAL_CAPSULE | Freq: Three times a day (TID) | ORAL | Status: DC
  Administered 2016-10-06 – 2016-10-07 (×3): 300 mg via ORAL
  Filled 2016-10-06 (×3): qty 1

## 2016-10-06 NOTE — Evaluation (Signed)
Occupational Therapy Evaluation Patient Details Name: Dawn [name] MRN: [id number] DOB: 05/14/1940 Today's Date: 10/06/2016    History of Present Illness 76 year old female with medical history significant for COPD, anxiety, depression and recurrent UTIs. Was sent by her nursing home for somnolence and hypoxemia. Rt hip hemiarthroplasty on 01/12/16. Recent L ankle fracture, in cast.    Clinical Impression   Pt admitted with the above diagnoses and presents with below problem list. Pt will benefit from continued acute OT to address the below listed deficits and maximize independence with basic ADLs prior to d/c to venue below. Prior to recent ankle fracture pt was mod I with ADLs, mobility limited to mostly in-room. Pt reports she has not done any transfers or mobility since Sat. "I've been in the bed since Saturday". Pt with great difficulty achieving NWB LLE and with pivoting on R foot, reports R hip still gets sore sometimes.      Follow Up Recommendations  SNF    Equipment Recommendations  Other (comment) (defer to next venue)    Recommendations for Other Services PT consult     Precautions / Restrictions Precautions Precautions: Fall Required Braces or Orthoses: Other Brace/Splint (L ankle in cast) Restrictions Weight Bearing Restrictions: Yes LLE Weight Bearing: Non weight bearing Other Position/Activity Restrictions: not in order set but assuming NWB LLE , nurse seeking clarification from MD     Mobility Bed Mobility Overal bed mobility: Needs Assistance Bed Mobility: Supine to Sit     Supine to sit: HOB elevated;Min guard;Min assist     General bed mobility comments: light assist to powerup and steady balance.   Transfers Overall transfer level: Needs assistance Equipment used: Rolling walker (2 wheeled) Transfers: Sit to/from [facility] Sit to Stand: Min assist;+2 physical assistance Stand pivot transfers: +2 physical assistance;Max  assist       General transfer comment: Pt with difficulty raising LLE off ground for NWB and difficulty pivoting on RLE. EOB>BSC transfering to her right side then stood from [facility] and had recliner brought to her to sit down in.     Balance Overall balance assessment: Needs assistance;History of Falls Sitting-balance support: Bilateral upper extremity supported;No upper extremity supported Sitting balance-Leahy Scale: Fair     Standing balance support: Bilateral upper extremity supported Standing balance-Leahy Scale: Poor Standing balance comment: unable to stand more than 1-3 seconds in NWB position.                           ADL either performed or assessed with clinical judgement   ADL Overall ADL's : Needs assistance/impaired Eating/Feeding: Set up;Sitting   Grooming: Set up;Sitting   Upper Body Bathing: Set up;Sitting   Lower Body Bathing: Sitting/lateral leans;Moderate assistance   Upper Body Dressing : Set up;Sitting   Lower Body Dressing: Moderate assistance;Sitting/lateral leans   Toilet Transfer: Moderate assistance;+2 for physical assistance;Squat-pivot   Toileting- Clothing Manipulation and Hygiene: Moderate assistance;Sitting/lateral lean   Tub/ Shower Transfer: Moderate assistance;Squat-pivot;+2 for physical assistance     General ADL Comments: Pt completed SPT EOB>BSC with +2 assist. Max difficulty with NWB and pivoting on RLE. Very weak.      Vision         Perception     Praxis      Pertinent Vitals/Pain Pain Assessment: No/denies pain     Hand Dominance Right   Extremity/Trunk Assessment Upper Extremity Assessment Upper Extremity Assessment: Overall WFL for tasks assessed   Lower Extremity  Assessment Lower Extremity Assessment: Defer to PT evaluation       Communication Communication Communication: No difficulties   Cognition Arousal/Alertness: Awake/alert Behavior During Therapy: WFL for tasks assessed/performed Overall  Cognitive Status: Within Functional Limits for tasks assessed                                     General Comments       Exercises Exercises: Other exercises (educated on general UB strengthening exercises)   Shoulder Instructions      Home Living Family/patient expects to be discharged to:: Skilled nursing facility                                        Prior Functioning/Environment Level of Independence: Needs assistance        Comments: Pt has been at [facility] since hip surgery 1/4. Mod I for ADLs. Pt takes meals in her room. Walks out in hall "occasionally."        OT Problem List: Impaired balance (sitting and/or standing);Decreased knowledge of use of DME or AE;Decreased knowledge of precautions;Pain;Cardiopulmonary status limiting activity      OT Treatment/Interventions: Self-care/ADL training;Therapeutic exercise;DME and/or AE instruction;Therapeutic activities;Patient/family education;Balance training    OT Goals(Current goals can be found in the care plan section) Acute Rehab OT Goals Patient Stated Goal: get stronger OT Goal Formulation: With patient Time For Goal Achievement: 10/20/16 Potential to Achieve Goals: Good ADL Goals Pt Will Perform Grooming: with set-up;sitting (2-3 tasks) Pt Will Perform Lower Body Bathing: with min assist;sitting/lateral leans;sit to/from stand (+2) Pt Will Perform Lower Body Dressing: with min assist;sit to/from stand;sitting/lateral leans (+2) Pt Will Transfer to Toilet: with min assist;squat pivot transfer (+2) Pt Will Perform Toileting - Clothing Manipulation and hygiene: with mod assist;sitting/lateral leans  OT Frequency: Min 2X/week   Barriers to D/C:            Co-evaluation              AM-PAC PT "6 Clicks" Daily Activity     Outcome Measure Help from another person eating meals?: None Help from another person taking care of personal grooming?: None Help from another  person toileting, which includes using toliet, bedpan, or urinal?: Total Help from another person bathing (including washing, rinsing, drying)?: A Lot Help from another person to put on and taking off regular upper body clothing?: None Help from another person to put on and taking off regular lower body clothing?: A Lot 6 Click Score: 17   End of Session Equipment Utilized During Treatment: Rolling walker;Oxygen Nurse Communication: Other (comment) (NTs assisting in room)  Activity Tolerance: Patient limited by fatigue Patient left: in chair;with call bell/phone within reach  OT Visit Diagnosis: Unsteadiness on feet (R26.81);Pain;History of falling (Z91.81);Muscle weakness (generalized) (M62.81)                Time: 8469-6295 OT Time Calculation (min): 37 min Charges:  OT General Charges $OT Visit: 1 Visit OT Evaluation $OT Eval Moderate Complexity: 1 Mod OT Treatments $Self Care/Home Management : 8-22 mins G-Codes:       Hortencia Pilar 10/06/2016, 12:54 PM

## 2016-10-06 NOTE — Progress Notes (Signed)
Text paged provider per occupational therapy regarding weight bearing status of fractured ankle.

## 2016-10-06 NOTE — NC FL2 (Signed)
[place] LEVEL OF CARE SCREENING TOOL     IDENTIFICATION  Patient Name: Dawn [name] Birthdate: [birth year] Sex: female Admission Date (Current Location): 10/03/2016  [facility] [place] and [place] Number:  Herbalist and Address:  [facility], [place] [address], [place], [place] [address]      Provider Number: [id number]  Attending Physician Name and Address:  Kerney Elbe, DO  Relative Name and Phone Number:  [name], daughter, [phone]    Current Level of Care: Hospital Recommended Level of Care: [place] Prior Approval Number:    Date Approved/Denied:   PASRR Number: [id number]  Discharge Plan: SNF    Current Diagnoses: Patient Active Problem List   Diagnosis Date Noted  . Altered mental status 10/03/2016  . Ankle fracture 10/03/2016  . Neuropathy 10/03/2016  . Chronic diastolic CHF (congestive heart failure) ([place]) 10/03/2016  . Acute lower UTI   . Recurrent falls 02/02/2016  . 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 Upper GI bleed 01/23/2016  . Blood loss anemia 01/23/2016  . Acute diastolic CHF (congestive heart failure) ([place]) 01/23/2016  . Heme + stool   . Abnormal CT scan, colon   . 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 Hypoxia   . At risk for adverse drug event 01/18/2016  . Subcapital fracture of right hip, closed 01/12/2016  . History of depression 12/12/2015  . Alteration consciousness 11/30/2015  . SVT (supraventricular tachycardia) ([place]) 11/27/2015  . Diarrhea 11/27/2015  . Depression 11/27/2015  . Polyneuropathy 11/27/2015  . Renal insufficiency   . Protein-calorie malnutrition, severe 11/18/2015  . Intra-abdominal fluid collection   . Premature atrial contractions   . Functional diarrhea   . Sepsis ([place])   . Hyperthyroidism   . Hypotension   . Septic shock ([place])   . Acute encephalopathy   . Hypokalemia   . Acute delirium 11/10/2015  . AKI (acute kidney injury)  ([place]) 11/10/2015  . Anemia, iron deficiency 11/10/2015  . Anxiety 11/10/2015  . Chronic pain syndrome 11/10/2015  . Recurrent UTI 11/10/2015  . Acute respiratory failure with hypoxia ([place]) 11/10/2015  . COPD (chronic obstructive pulmonary disease) ([place]) 11/10/2015  . Acute hyperglycemia 11/10/2015  . CKD (chronic kidney disease) stage 3, GFR 30-59 ml/min 11/10/2015  . Narrow complex tachycardia ([place]) 11/10/2015  . HTN (hypertension) 11/10/2015  . Generalized weakness 11/10/2015  . Anemia   . Delirium   . Hypoxemia   . Tachycardia     Orientation RESPIRATION BLADDER Height & Weight     Self, Time, Situation, Place  O2 (Nasal cannula 4L) Incontinent, External catheter Weight: 71.7 kg (158 lb) Height:  5\' 8"  (172.7 cm)  BEHAVIORAL SYMPTOMS/MOOD NEUROLOGICAL BOWEL NUTRITION STATUS      Incontinent Diet (Please see DC Summary)  AMBULATORY STATUS COMMUNICATION OF NEEDS Skin   Limited Assist Verbally Normal                       Personal Care Assistance Level of Assistance  Bathing, Feeding, Dressing Bathing Assistance: Limited assistance Feeding assistance: Independent Dressing Assistance: Limited assistance     Functional Limitations Info             SPECIAL CARE FACTORS FREQUENCY                       Contractures      Additional Factors Info  Psychotropic Code Status Info: Full Allergies Info: NKA Psychotropic Info:  Zoloft         Current Medications (10/06/2016):  This is the current hospital active medication list Current Facility-Administered Medications  Medication Dose Route Frequency Provider Last Rate Last Dose  . 0.9 %  sodium chloride infusion   Intravenous Continuous Sheikh, Omair Latif, DO      . acetaminophen (TYLENOL) tablet 650 mg  650 mg Oral Q6H PRN Waldemar Dickens, MD   650 mg at 10/04/16 0546   Or  . acetaminophen (TYLENOL) suppository 650 mg  650 mg Rectal Q6H PRN Waldemar Dickens, MD      . arformoterol [facility]) nebulizer  solution 15 mcg  15 mcg Nebulization BID Patrecia Pour, Christean Grief, MD   15 mcg at 10/06/16 [phone]  . budesonide (PULMICORT) nebulizer solution 0.5 mg  0.5 mg Nebulization BID Patrecia Pour, Christean Grief, MD   0.5 mg at 10/06/16 0745  . cefTRIAXone (ROCEPHIN) 1 g in dextrose 5 % 50 mL IVPB  1 g Intravenous Q24H Waldemar Dickens, MD   Stopped at 10/05/16 [phone]  . digoxin (LANOXIN) tablet 0.125 mg  0.125 mg Oral Q T,Th,S,Su Waldemar Dickens, MD   0.125 mg at 10/04/16 1028  . digoxin (LANOXIN) tablet 0.25 mg  0.25 mg Oral Q M,W,F Waldemar Dickens, MD   0.25 mg at 10/05/16 8588  . docusate sodium (COLACE) capsule 100 mg  100 mg Oral BID PRN Waldemar Dickens, MD      . flecainide [facility]) tablet 50 mg  50 mg Oral BID Waldemar Dickens, MD   50 mg at 10/06/16 0859  . gabapentin (NEURONTIN) capsule 300 mg  300 mg Oral TID Raiford Noble Latif, DO      . heparin injection 5,000 Units  5,000 Units Subcutaneous Q8H Waldemar Dickens, MD   5,000 Units at 10/06/16 [phone]  . HYDROcodone-acetaminophen (NORCO/VICODIN) 5-325 MG per tablet 1 tablet  1 tablet Oral Q6H PRN Patrecia Pour, Christean Grief, MD   1 tablet at 10/05/16 [phone]  . ketorolac (ACULAR) 0.5 % ophthalmic solution 1 drop  1 drop Right Eye QID Hammons, Kimberly B, RPH   1 drop at 10/06/16 0902  . levalbuterol (XOPENEX) nebulizer solution 1.25 mg  1.25 mg Nebulization Q4H PRN Patrecia Pour, Christean Grief, MD      . magnesium hydroxide (MILK OF MAGNESIA) suspension 30 mL  30 mL Oral Daily PRN Waldemar Dickens, MD      . mirtazapine (REMERON) tablet 15 mg  15 mg Oral QHS Waldemar Dickens, MD   15 mg at 10/05/16 2214  . naphazoline-glycerin (CLEAR EYES) ophth solution 1 drop  1 drop Both Eyes BID Waldemar Dickens, MD   1 drop at 10/06/16 0902  . ondansetron (ZOFRAN) tablet 4 mg  4 mg Oral Q6H PRN Waldemar Dickens, MD       Or  . ondansetron [facility]) injection 4 mg  4 mg Intravenous Q6H PRN Waldemar Dickens, MD      . pantoprazole (PROTONIX) EC tablet 40 mg  40 mg Oral Daily Waldemar Dickens,  MD   40 mg at 10/06/16 0900  . prednisoLONE acetate (PRED FORTE) 1 % ophthalmic suspension 1 drop  1 drop Left Eye Daily Hammons, Theone Murdoch, RPH   1 drop at 10/05/16 0918  . [START ON 10/07/2016] prednisoLONE acetate (PRED FORTE) 1 % ophthalmic suspension 1 drop  1 drop Right Eye TID Hammons, Kimberly B, RPH       Followed by  . [START ON 10/14/2016]  prednisoLONE acetate (PRED FORTE) 1 % ophthalmic suspension 1 drop  1 drop Right Eye BID Hammons, Theone Murdoch, RPH       Followed by  . [START ON 10/21/2016] prednisoLONE acetate (PRED FORTE) 1 % ophthalmic suspension 1 drop  1 drop Right Eye Daily Hammons, Theone Murdoch, RPH      . sertraline (ZOLOFT) tablet 50 mg  50 mg Oral Daily Waldemar Dickens, MD   50 mg at 10/05/16 2214  . umeclidinium bromide (INCRUSE ELLIPTA) 62.5 MCG/INH 1 puff  1 puff Inhalation Daily Waldemar Dickens, MD   1 puff at 10/06/16 0156     Discharge Medications: Please see discharge summary for a list of discharge medications.  Relevant Imaging Results:  Relevant Lab Results:   Additional Information SSN:  [id number]  Benard Halsted, LCSWA

## 2016-10-06 NOTE — Evaluation (Signed)
Physical Therapy Evaluation Patient Details Name: Dawn [name] MRN: [id number] DOB: 05/20/1940 Today's Date: 10/06/2016   History of Present Illness  76 year old female with medical history significant for COPD, anxiety, depression and recurrent UTIs. Was sent by her nursing home for somnolence and hypoxemia. Rt hip hemiarthroplasty on 01/12/16. Recent L ankle fracture, in cast. Dx with UTI.   Clinical Impression  I kept pt NWB on her left as this is the most conservative option when no WB is ordered.  Given the recent nature of the fracture and the fact that it was casted, I would assume NWB would be appropriate.  Pt was able to scoot over to the drop arm recliner chair with me and encouraged to sit up for the second time today.  She would benefit from resuming therapy at [facility] when she gets back and could tolerate 5 or more times a week.   PT to follow acutely for deficits listed below.      Follow Up Recommendations SNF (from [facility])    Equipment Recommendations  None recommended by PT    Recommendations for Other Services   NA    Precautions / Restrictions Precautions Precautions: Fall Required Braces or Orthoses: Other Brace/Splint (L ankle in cast) Restrictions Weight Bearing Restrictions: Yes LLE Weight Bearing: Non weight bearing Other Position/Activity Restrictions: not in order set but assuming NWB LLE      Mobility  Bed Mobility Overal bed mobility: Needs Assistance Bed Mobility: Supine to Sit     Supine to sit: HOB elevated;Supervision     General bed mobility comments: Supervision for safety, pt heavily reliant on bed rail to pull up to sitting EOB.   Transfers Overall transfer level: Needs assistance   Transfers: Lateral/Scoot Transfers          Lateral/Scoot Transfers: Mod assist;From elevated surface General transfer comment: Mod assist to scoot over to drop arm chair from mildly elevated bed with mod assist using bed pad to help unweight  hips.  Pt with increased DOE with this effort O2 sats stable on 4 L O2 [place]  Ambulation/Gait             General Gait Details: Unable at this time due to NWB status of left leg.         Balance Overall balance assessment: History of Falls;Needs assistance Sitting-balance support: Bilateral upper extremity supported;No upper extremity supported Sitting balance-Leahy Scale: Fair Sitting balance - Comments: supervision EOB, not formally tested                                          Home Living Family/patient expects to be discharged to:: Skilled nursing facility [facility])                      Prior Function Level of Independence: Needs assistance   Gait / Transfers Assistance Needed: Per pt report she used a RW to get around at SNF     Comments: Pt has been at [facility] since hip surgery 1/4. Mod I for ADLs. Pt takes meals in her room. Walks out in hall "occasionally." and is no longer currently active with therapy     Hand Dominance   Dominant Hand: Right    Extremity/Trunk Assessment   Upper Extremity Assessment Upper Extremity Assessment: Defer to OT evaluation    Lower Extremity Assessment Lower Extremity Assessment: RLE deficits/detail;LLE  deficits/detail RLE Deficits / Details: right leg generally weak, unable to stand (for me) with RW on only her right leg.  At least 3/5.   LLE Deficits / Details: Left leg caseted below knee down to toes.  Pt able to wiggle and feel toes.  Pt able to move bil legs over EOB without assist, so left knee and hip are likely at least 3+/5       Communication   Communication: No difficulties  Cognition Arousal/Alertness: Awake/alert Behavior During Therapy: WFL for tasks assessed/performed Overall Cognitive Status: No family/caregiver present to determine baseline cognitive functioning                                               Assessment/Plan    PT Assessment Patient  needs continued PT services  PT Problem List Decreased strength;Decreased activity tolerance;Decreased mobility;Decreased balance;Decreased knowledge of use of DME;Pain;Decreased range of motion           PT Goals (Current goals can be found in the Care Plan section)  Acute Rehab PT Goals Patient Stated Goal: get stronger    Frequency Min 2X/week           AM-PAC PT "6 Clicks" Daily Activity  Outcome Measure Difficulty turning over in bed (including adjusting bedclothes, sheets and blankets)?: None Difficulty moving from lying on back to sitting on the side of the bed? : None Difficulty sitting down on and standing up from a chair with arms (e.g., wheelchair, bedside commode, etc,.)?: Unable Help needed moving to and from a bed to chair (including a wheelchair)?: A Lot Help needed walking in hospital room?: Total Help needed climbing 3-5 steps with a railing? : Total 6 Click Score: 13    End of Session Equipment Utilized During Treatment: Oxygen Activity Tolerance: Patient limited by fatigue;Patient limited by pain Patient left: in chair;with call bell/phone within reach;with chair alarm set Nurse Communication: Mobility status PT Visit Diagnosis: Unsteadiness on feet (R26.81);Repeated falls (R29.6);Difficulty in walking, not elsewhere classified (R26.2);Pain Pain - Right/Left: Left Pain - part of body: Ankle and joints of foot    Time: 9470-9628 PT Time Calculation (min) (ACUTE ONLY): 28 min   Charges:         Wells Guiles B. Jakoby Melendrez, PT, DPT [phone]   PT Evaluation $PT Eval Moderate Complexity: 1 Mod PT Treatments $Therapeutic Activity: 8-22 mins   10/06/2016, 5:26 PM

## 2016-10-06 NOTE — Clinical Social Work Note (Signed)
Clinical Social Work Assessment  Patient Details  Name: Dawn [name] MRN: [id number] Date of Birth: January 30, 1940  Date of referral:  10/06/16               Reason for consult:  Discharge Planning                Permission sought to share information with:  Facility Sport and exercise psychologist, Family Supports Permission granted to share information::  Yes, Verbal Permission Granted  Name::     [name]  Agency::  [facility]  Relationship::  Daughter  Contact Information:  [phone]  Housing/Transportation Living arrangements for the past 2 months:  [place] of Information:  Patient, Adult Children, Facility Patient Interpreter Needed:  None Criminal Activity/Legal Involvement Pertinent to Current Situation/Hospitalization:  No - Comment as needed Significant Relationships:  Adult Children Lives with:  Facility Resident Do you feel safe going back to the place where you live?  Yes Need for family participation in patient care:  No (Coment)  Care giving concerns:  CSW received consult regarding discharge planning. CSW spoke with patient's daughter. Patient resides at [facility] and will return there at discharge. CSW to continue to follow and assist with discharge planning needs.   Social Worker assessment / plan:  CSW spoke with patient's daughter concerning possibility of returning to [place].  Employment status:  Retired Forensic scientist:  Medicare PT Recommendations:  Not assessed at this time Information / Referral to community resources:  [place]  Patient/Family's Response to care:  Patient's daughter reported agreement with discharge plan back to [facility].  Patient/Family's Understanding of and Emotional Response to Diagnosis, Current Treatment, and Prognosis:  Patient/family is realistic regarding therapy needs and expressed being hopeful for SNF placement. Patient expressed understanding of CSW role and discharge process  as well as medical condition. No questions/concerns about plan or treatment.    Emotional Assessment Appearance:  Appears stated age Attitude/Demeanor/Rapport:  Other (Appropriate) Affect (typically observed):  Appropriate Orientation:  Oriented to Self, Oriented to Situation, Oriented to Place, Oriented to  Time (Confusion at times) Alcohol / Substance use:  Not Applicable Psych involvement (Current and /or in the community):  No (Comment)  Discharge Needs  Concerns to be addressed:  Care Coordination Readmission within the last 30 days:  No Current discharge risk:  None Barriers to Discharge:  Continued Medical Work up   [employer], [place] 10/06/2016, 11:51 AM

## 2016-10-06 NOTE — Progress Notes (Addendum)
Patients Hemoglobin was 9.7 yesterday and is 8.8 today, night nurse found blood on sheet but couldn't find where it was coming from. Provider notified and will continue to monitor.  8:56 assessment complete no indication of bleeding.

## 2016-10-06 NOTE — Progress Notes (Signed)
Initial Nutrition Assessment  DOCUMENTATION CODES:   Not applicable  INTERVENTION:  - Ensure Enlive po BID, each supplement provides 350 kcal and 20 grams of protein  - Recommend obtaining a new weight for patient  NUTRITION DIAGNOSIS:   Increased nutrient needs related to other (see comment), acute illness (healing of ankle fracture) as evidenced by estimated needs.  GOAL:   Patient will meet greater than or equal to 90% of their needs  MONITOR:   PO intake, Supplement acceptance, Weight trends, I & O's  REASON FOR ASSESSMENT:   Consult Assessment of nutrition requirement/status  ASSESSMENT:   Pt with PMH of COPD, anxiety, depression, recurrent UTIs, spinal stenosis presents with AMS s/p fall with an ankle fracture. Pt also found to have COPD exacerbation.   Pt reports a decreased appetite over the past 6 months. Reporting yesterday she only ate peaches and the day before peaches and tomato soup. Pt reports at [facility] she never consumed a full meal. Pt reports an increase in appetite. Per chart review pt consumed 75% of three meals since admission. Pt reports drinking Ensure PTA stating "I love those things"  Patient unsure of weight status. Per chart review it appears pt's weight has trended upwards over the past 7 months. Unable to take pt's bed weight as pt sitting in chair by bedside.   Pt being monitored regarding decreasing Hemoglobin level  Nutrition focused physical exam completed. Findings include no fat depletions, moderate to severe muscle depletions and no edema. Unable to diagnose malnutrition at this time given pt's AMS and lack of weight history.   Labs reviewed; Hemoglobin 8.8 Medications reviewed; Remeron, ProtoDictation #1 [id number]  [id number] nix, Prednisolone, Zoloft  Diet Order:  Diet regular Room service appropriate? Yes; Fluid consistency: Thin  Skin:  Reviewed, no issues  Last BM:  10/04/16  Height:   Ht Readings from Last 1  Encounters:  10/04/16 5\' 8"  (1.727 m)    Weight:   Wt Readings from Last 1 Encounters:  10/04/16 158 lb (71.7 kg)    Ideal Body Weight:  63.6 kg  BMI:  Body mass index is 24.02 kg/m.  Estimated Nutritional Needs:   Kcal:  1600-1800  Protein:  90-110 grams  Fluid:  >/= 1.6 L/d  EDUCATION NEEDS:   Education needs no appropriate at this time  [place] Ranger, MS, RDN, LDN 10/06/2016 2:05 PM

## 2016-10-06 NOTE — Progress Notes (Signed)
PROGRESS NOTE    Dawn [name]  GGY:[id number] DOB: 1940/10/05 DOA: 10/03/2016 PCP: [name], MD  Brief Narrative:  Dawn [name] a 76 year old female with medical history significant for COPD, anxiety, depression and recurrent UTIs. Was sent by her nursing home for somnolence and hypoxemia. At her facility she was found to be hypoxic with O2 saturation around the 70s she was administered 4 L of nasal cannula with improvement of O2 saturation to 90%. Patient report no recollection of this event. Upon ED evaluation UA was found to be grossly of normal and she was admitted for UTI and IV antibiotics. UTI showed >100,000 CFU of E Coli. Patient also found to have a COPD Exacerbation. She is improving but Cr bumped from yesterday so IVF was restarted.    Assessment & Plan:   Active Problems:   AKI (acute kidney injury) ([place])   COPD (chronic obstructive pulmonary disease) (HCC)   Delirium   SVT (supraventricular tachycardia) (HCC)   Altered mental status   Ankle fracture   Neuropathy   Chronic diastolic CHF (congestive heart failure) (HCC)  Acute metabolic encephalopathy likely secondary to Hypoxia and E Coli UTI with concern for Polypharmacy, improved    -Obtained CT head - was not ordered on the ED; Showed No CT evidence for acute intracranial abnormality. Atrophy and small vessel ischemic changes of the white matter  -Continue abx - Ceftriaxone  -No urine cultures obtained on admission, sent add on and showing >100,000 CFU of E. Coli - Sensitive to Ceftriaxone so will continue  -O2 as needed  -Changed Gabapentin Dose to 300 mg po TID  COPD Exacerbation  -Wheezing and Hypoxia yesterday but not as much  -C/w Budesonide 0.5 mg Neb BID and Arformoterol 15 mcg Neb BID -Continue Xopenex 1.25 mg Neb q4hprn -C/w Supplemental O2 and wean as tolerated; keep O2 sat > 89% -Obtain Home O2 Screen   Acute Respiratory Failure with Hypoxia 2/2 to COPD -As Above -Repeat CXR in  AM -Wean O2 as Tolerated -Check Pulse Ox while Ambulating in AM   AKI  -Cr upon admission 1.8 - baseline 1.0 -Treated with IVF - Cr down to 1.15 but worsened today so started back on IVF with NS at 75 mL/hr -Repeat BMP this Afternoon timed for 1400 not drawn  -Continue to Monitor and Repeat CMP in AM    L Ankle Fracture  9/23 - outpatient follow up with Dr Alvan Dame -May need to cut down on Gabapentin dose -Pain Control with Acetaminophen 650 mg po q6hprn and Hydrocodone-Acetaminophen 1 tab po q6hprn  -PT/OT Eval and Treat recommending SNF  Hx of SVT - stable  -Continue Digoxin 0.125 mg T-Th-S-Su and 0.25 mg po M-W-F and -C/w Flecanide 50 mg po BID -Sees cardiology Dr Curt Bears as outpatient  -C/w Telemetry and Continue to Monitor   Neuropathy and Hx of Spinal Stenosis especially in Neck  - Continue Gabapentin 300 mg po TID but may reduce dose given recent falls    Depression/Anxiety - Continue Mirtazapine 15 mg po daily and Sertraline 50 mg po Daily  GERD: - Continue PPI Pantoprazole 40 mg Daily  Macrocytic Anemia -Hb/Hct dropped from 10.8/34.3 -> 9.7/32.2 -> 8.8/29.0 and patient was not on IVF yesterday -Check FOBT and Repeat CBC in AM  -Type and Screen   DVT prophylaxis: Heparin 5,000 units sq q8h Code Status: FULL CODE Family Communication: No family present at bedside Disposition Plan: SNF when stable to D/C  Consultants:   None  Procedures: None   Antimicrobials:  Anti-infectives    Start     Dose/Rate Route Frequency Ordered Stop   10/03/16 1000  cefTRIAXone (ROCEPHIN) 1 g in dextrose 5 % 50 mL IVPB     1 g 100 mL/hr over 30 Minutes Intravenous Every 24 hours 10/03/16 0926       Subjective: Seen and examined at beside and felt better than yesterday and states Headache improved. No CP or SOB. Denied any lightheadedness.   Objective: Vitals:   10/05/16 2331 10/06/16 0539 10/06/16 0743 10/06/16 1611  BP: (!) 92/50 (!) 112/51  132/65  Pulse: 62 62  74    Resp:  18    Temp:  98.3 F (36.8 C)  98.3 F (36.8 C)  TempSrc:  Oral  Oral  SpO2: 99% 95% 97% (!) 87%  Weight:      Height:        Intake/Output Summary (Last 24 hours) at 10/06/16 2033 Last data filed at 10/06/16 7867  Gross per 24 hour  Intake          1003.75 ml  Output              950 ml  Net            53.75 ml   Filed Weights   10/04/16 1328  Weight: 71.7 kg (158 lb)   Examination: Physical Exam:  Constitutional: Caucasian female in NAD who appears calm and comfortable  Eyes: Sclerae anicteric. Lids and conjunctivae normal ENMT: External Ears and nose appear normal. MMM Neck: Supple with no JVD Respiratory: Diminished to Auscultation. No accessory muscle use Cardiovascular: RRR; No m/r/g. No appreciable Extremity edema Abdomen: Soft, NT, ND. Bowel sounds present GU: Deferred Musculoskeletal: No contractures or cyanosis. Left Leg in a cast Skin: Warm and Dry. No rashes or lesions on a limited skin eval Neurologic: Has some tremors in Lip and Left hand. CN 2-12 grossly intact Psychiatric: Pleasant mood and affect; Intact judgement and insight  Data Reviewed: I have personally reviewed following labs and imaging studies  CBC:  Recent Labs Lab 10/03/16 0457 10/04/16 0659 10/05/16 0342 10/06/16 0321  WBC 8.1 6.1 7.3 5.1  NEUTROABS 6.9  --   --  2.9  HGB 10.8* 9.4* 9.7* 8.8*  HCT 35.3* 31.6* 32.2* 29.0*  MCV 99.7 100.6* 100.0 100.3*  PLT 168 130* 158 672*   Basic Metabolic Panel:  Recent Labs Lab 10/03/16 0457 10/04/16 0659 10/05/16 0342 10/06/16 0321  NA 138 141 139 140  K 5.0 4.4 4.9 4.2  CL 100* 106 102 102  CO2 26 30 29  32  GLUCOSE 118* 92 98 90  BUN 27* 21* 18 23*  CREATININE 1.80* 1.22* 1.15* 1.54*  CALCIUM 8.9 8.3* 8.5* 8.4*  MG  --   --   --  2.0  PHOS  --   --   --  3.0   GFR: Estimated Creatinine Clearance: 30.9 mL/min (A) (by C-G formula based on SCr of 1.54 mg/dL (H)). Liver Function Tests:  Recent Labs Lab 10/06/16 0321   AST 22  ALT 16  ALKPHOS 88  BILITOT 0.7  PROT 6.4*  ALBUMIN 2.7*   No results for input(s): LIPASE, AMYLASE in the last 168 hours. No results for input(s): AMMONIA in the last 168 hours. Coagulation Profile: No results for input(s): INR, PROTIME in the last 168 hours. Cardiac Enzymes: No results for input(s): CKTOTAL, CKMB, CKMBINDEX, TROPONINI in the last 168 hours. BNP (last 3 results) No  results for input(s): PROBNP in the last 8760 hours. HbA1C: No results for input(s): HGBA1C in the last 72 hours. CBG: No results for input(s): GLUCAP in the last 168 hours. Lipid Profile: No results for input(s): CHOL, HDL, LDLCALC, TRIG, CHOLHDL, LDLDIRECT in the last 72 hours. Thyroid Function Tests: No results for input(s): TSH, T4TOTAL, FREET4, T3FREE, THYROIDAB in the last 72 hours. Anemia Panel: No results for input(s): VITAMINB12, FOLATE, FERRITIN, TIBC, IRON, RETICCTPCT in the last 72 hours. Sepsis Labs: No results for input(s): PROCALCITON, LATICACIDVEN in the last 168 hours.  Recent Results (from the past 240 hour(s))  MRSA PCR Screening     Status: None   Collection Time: 10/03/16 11:35 PM  Result Value Ref Range Status   MRSA by PCR NEGATIVE NEGATIVE Final    Comment:        The GeneXpert MRSA Assay (FDA approved for NASAL specimens only), is one component of a comprehensive MRSA colonization surveillance program. It is not intended to diagnose MRSA infection nor to guide or monitor treatment for MRSA infections.   Culture, Urine     Status: Abnormal   Collection Time: 10/04/16  8:00 AM  Result Value Ref Range Status   Specimen Description URINE, RANDOM  Final   Special Requests NONE  Final   Culture >=100,000 COLONIES/mL ESCHERICHIA COLI (A)  Final   Report Status 10/06/2016 FINAL  Final   Organism ID, Bacteria ESCHERICHIA COLI (A)  Final      Susceptibility   Escherichia coli - MIC*    AMPICILLIN >=32 RESISTANT Resistant     CEFAZOLIN <=4 SENSITIVE Sensitive      CEFTRIAXONE <=1 SENSITIVE Sensitive     CIPROFLOXACIN >=4 RESISTANT Resistant     GENTAMICIN >=16 RESISTANT Resistant     IMIPENEM <=0.25 SENSITIVE Sensitive     NITROFURANTOIN <=16 SENSITIVE Sensitive     TRIMETH/SULFA >=320 RESISTANT Resistant     AMPICILLIN/SULBACTAM 16 INTERMEDIATE Intermediate     PIP/TAZO <=4 SENSITIVE Sensitive     Extended ESBL NEGATIVE Sensitive     * >=100,000 COLONIES/mL ESCHERICHIA COLI    Radiology Studies: No results found. Scheduled Meds: . arformoterol  15 mcg Nebulization BID  . budesonide (PULMICORT) nebulizer solution  0.5 mg Nebulization BID  . digoxin  0.125 mg Oral Q T,Th,S,Su  . digoxin  0.25 mg Oral Q M,W,F  . feeding supplement (ENSURE ENLIVE)  237 mL Oral BID BM  . flecainide  50 mg Oral BID  . gabapentin  300 mg Oral TID  . heparin  5,000 Units Subcutaneous Q8H  . ketorolac  1 drop Right Eye QID  . mirtazapine  15 mg Oral QHS  . naphazoline-glycerin  1 drop Both Eyes BID  . pantoprazole  40 mg Oral Daily  . prednisoLONE acetate  1 drop Left Eye Daily  . [START ON 10/07/2016] prednisoLONE acetate  1 drop Right Eye TID   Followed by  . [START ON 10/14/2016] prednisoLONE acetate  1 drop Right Eye BID   Followed by  . [START ON 10/21/2016] prednisoLONE acetate  1 drop Right Eye Daily  . sertraline  50 mg Oral Daily  . umeclidinium bromide  1 puff Inhalation Daily   Continuous Infusions: . sodium chloride 75 mL/hr at 10/06/16 1219  . cefTRIAXone (ROCEPHIN)  IV Stopped (10/06/16 1249)    LOS: 2 days   Kerney Elbe, DO [facility] Pager [phone]  If 7PM-7AM, please contact night-coverage [email or web address] Password [facility] 10/06/2016, 8:33 PM

## 2016-10-07 DIAGNOSIS — N179 Acute kidney failure, unspecified: Secondary | ICD-10-CM | POA: Diagnosis not present

## 2016-10-07 DIAGNOSIS — R0602 Shortness of breath: Secondary | ICD-10-CM | POA: Diagnosis not present

## 2016-10-07 DIAGNOSIS — I129 Hypertensive chronic kidney disease with stage 1 through stage 4 chronic kidney disease, or unspecified chronic kidney disease: Secondary | ICD-10-CM | POA: Diagnosis not present

## 2016-10-07 DIAGNOSIS — R531 Weakness: Secondary | ICD-10-CM | POA: Diagnosis not present

## 2016-10-07 DIAGNOSIS — B9620 Unspecified Escherichia coli [E. coli] as the cause of diseases classified elsewhere: Secondary | ICD-10-CM | POA: Diagnosis not present

## 2016-10-07 DIAGNOSIS — E039 Hypothyroidism, unspecified: Secondary | ICD-10-CM | POA: Diagnosis not present

## 2016-10-07 DIAGNOSIS — M4802 Spinal stenosis, cervical region: Secondary | ICD-10-CM | POA: Diagnosis not present

## 2016-10-07 DIAGNOSIS — S82892D Other fracture of left lower leg, subsequent encounter for closed fracture with routine healing: Secondary | ICD-10-CM | POA: Diagnosis not present

## 2016-10-07 DIAGNOSIS — F419 Anxiety disorder, unspecified: Secondary | ICD-10-CM | POA: Diagnosis not present

## 2016-10-07 DIAGNOSIS — Z0000 Encounter for general adult medical examination without abnormal findings: Secondary | ICD-10-CM | POA: Diagnosis not present

## 2016-10-07 DIAGNOSIS — M6281 Muscle weakness (generalized): Secondary | ICD-10-CM | POA: Diagnosis not present

## 2016-10-07 DIAGNOSIS — J441 Chronic obstructive pulmonary disease with (acute) exacerbation: Secondary | ICD-10-CM | POA: Diagnosis not present

## 2016-10-07 DIAGNOSIS — Z87440 Personal history of urinary (tract) infections: Secondary | ICD-10-CM | POA: Diagnosis not present

## 2016-10-07 DIAGNOSIS — E0590 Thyrotoxicosis, unspecified without thyrotoxic crisis or storm: Secondary | ICD-10-CM | POA: Diagnosis not present

## 2016-10-07 DIAGNOSIS — G894 Chronic pain syndrome: Secondary | ICD-10-CM | POA: Diagnosis not present

## 2016-10-07 DIAGNOSIS — R079 Chest pain, unspecified: Secondary | ICD-10-CM | POA: Diagnosis not present

## 2016-10-07 DIAGNOSIS — Z23 Encounter for immunization: Secondary | ICD-10-CM | POA: Diagnosis not present

## 2016-10-07 DIAGNOSIS — D500 Iron deficiency anemia secondary to blood loss (chronic): Secondary | ICD-10-CM | POA: Diagnosis not present

## 2016-10-07 DIAGNOSIS — N390 Urinary tract infection, site not specified: Secondary | ICD-10-CM | POA: Diagnosis not present

## 2016-10-07 DIAGNOSIS — R4182 Altered mental status, unspecified: Secondary | ICD-10-CM | POA: Diagnosis not present

## 2016-10-07 DIAGNOSIS — J80 Acute respiratory distress syndrome: Secondary | ICD-10-CM | POA: Diagnosis not present

## 2016-10-07 DIAGNOSIS — J9691 Respiratory failure, unspecified with hypoxia: Secondary | ICD-10-CM | POA: Diagnosis not present

## 2016-10-07 DIAGNOSIS — I471 Supraventricular tachycardia: Secondary | ICD-10-CM | POA: Diagnosis not present

## 2016-10-07 DIAGNOSIS — F329 Major depressive disorder, single episode, unspecified: Secondary | ICD-10-CM | POA: Diagnosis not present

## 2016-10-07 DIAGNOSIS — Z9981 Dependence on supplemental oxygen: Secondary | ICD-10-CM | POA: Diagnosis not present

## 2016-10-07 DIAGNOSIS — I5032 Chronic diastolic (congestive) heart failure: Secondary | ICD-10-CM | POA: Diagnosis not present

## 2016-10-07 DIAGNOSIS — I959 Hypotension, unspecified: Secondary | ICD-10-CM | POA: Diagnosis not present

## 2016-10-07 DIAGNOSIS — I4891 Unspecified atrial fibrillation: Secondary | ICD-10-CM | POA: Diagnosis not present

## 2016-10-07 DIAGNOSIS — Z9189 Other specified personal risk factors, not elsewhere classified: Secondary | ICD-10-CM | POA: Diagnosis not present

## 2016-10-07 DIAGNOSIS — D649 Anemia, unspecified: Secondary | ICD-10-CM | POA: Diagnosis not present

## 2016-10-07 DIAGNOSIS — G9341 Metabolic encephalopathy: Secondary | ICD-10-CM | POA: Diagnosis not present

## 2016-10-07 DIAGNOSIS — R488 Other symbolic dysfunctions: Secondary | ICD-10-CM | POA: Diagnosis not present

## 2016-10-07 DIAGNOSIS — R296 Repeated falls: Secondary | ICD-10-CM | POA: Diagnosis not present

## 2016-10-07 DIAGNOSIS — S82842D Displaced bimalleolar fracture of left lower leg, subsequent encounter for closed fracture with routine healing: Secondary | ICD-10-CM | POA: Diagnosis not present

## 2016-10-07 DIAGNOSIS — K219 Gastro-esophageal reflux disease without esophagitis: Secondary | ICD-10-CM | POA: Diagnosis not present

## 2016-10-07 DIAGNOSIS — R2689 Other abnormalities of gait and mobility: Secondary | ICD-10-CM | POA: Diagnosis not present

## 2016-10-07 DIAGNOSIS — R410 Disorientation, unspecified: Secondary | ICD-10-CM | POA: Diagnosis not present

## 2016-10-07 DIAGNOSIS — R0789 Other chest pain: Secondary | ICD-10-CM | POA: Diagnosis not present

## 2016-10-07 DIAGNOSIS — D539 Nutritional anemia, unspecified: Secondary | ICD-10-CM | POA: Diagnosis not present

## 2016-10-07 DIAGNOSIS — J449 Chronic obstructive pulmonary disease, unspecified: Secondary | ICD-10-CM | POA: Diagnosis not present

## 2016-10-07 DIAGNOSIS — N183 Chronic kidney disease, stage 3 (moderate): Secondary | ICD-10-CM | POA: Diagnosis not present

## 2016-10-07 DIAGNOSIS — G629 Polyneuropathy, unspecified: Secondary | ICD-10-CM | POA: Diagnosis not present

## 2016-10-07 DIAGNOSIS — D509 Iron deficiency anemia, unspecified: Secondary | ICD-10-CM | POA: Diagnosis not present

## 2016-10-07 DIAGNOSIS — I10 Essential (primary) hypertension: Secondary | ICD-10-CM | POA: Diagnosis not present

## 2016-10-07 DIAGNOSIS — S82842A Displaced bimalleolar fracture of left lower leg, initial encounter for closed fracture: Secondary | ICD-10-CM | POA: Diagnosis not present

## 2016-10-07 DIAGNOSIS — S82842S Displaced bimalleolar fracture of left lower leg, sequela: Secondary | ICD-10-CM | POA: Diagnosis not present

## 2016-10-07 LAB — COMPREHENSIVE METABOLIC PANEL
ALBUMIN: 2.8 g/dL — AB (ref 3.5–5.0)
ALK PHOS: 88 U/L (ref 38–126)
ALT: 12 U/L — AB (ref 14–54)
ANION GAP: 6 (ref 5–15)
AST: 15 U/L (ref 15–41)
BUN: 20 mg/dL (ref 6–20)
CALCIUM: 8.1 mg/dL — AB (ref 8.9–10.3)
CHLORIDE: 103 mmol/L (ref 101–111)
CO2: 31 mmol/L (ref 22–32)
CREATININE: 1.21 mg/dL — AB (ref 0.44–1.00)
GFR, EST AFRICAN AMERICAN: 49 mL/min — AB (ref >60)
GFR, EST NON AFRICAN AMERICAN: 42 mL/min — AB (ref >60)
Glucose, Bld: 96 mg/dL (ref 65–99)
Potassium: 3.5 mmol/L (ref 3.5–5.1)
SODIUM: 140 mmol/L (ref 135–145)
Total Bilirubin: 0.5 mg/dL (ref 0.3–1.2)
Total Protein: 6 g/dL — ABNORMAL LOW (ref 6.5–8.1)

## 2016-10-07 LAB — PHOSPHORUS: PHOSPHORUS: 2.6 mg/dL (ref 2.5–4.6)

## 2016-10-07 LAB — CBC WITH DIFFERENTIAL/PLATELET
BASOS ABS: 0 10*3/uL (ref 0.0–0.1)
BASOS PCT: 1 %
EOS ABS: 0.8 10*3/uL — AB (ref 0.0–0.7)
Eosinophils Relative: 15 %
HCT: 29 % — ABNORMAL LOW (ref 36.0–46.0)
HEMOGLOBIN: 9 g/dL — AB (ref 12.0–15.0)
Lymphocytes Relative: 19 %
Lymphs Abs: 1 10*3/uL (ref 0.7–4.0)
MCH: 30.9 pg (ref 26.0–34.0)
MCHC: 31 g/dL (ref 30.0–36.0)
MCV: 99.7 fL (ref 78.0–100.0)
Monocytes Absolute: 0.5 10*3/uL (ref 0.1–1.0)
Monocytes Relative: 10 %
NEUTROS PCT: 55 %
Neutro Abs: 3 10*3/uL (ref 1.7–7.7)
PLATELETS: 160 10*3/uL (ref 150–400)
RBC: 2.91 MIL/uL — AB (ref 3.87–5.11)
RDW: 14.4 % (ref 11.5–15.5)
WBC: 5.4 10*3/uL (ref 4.0–10.5)

## 2016-10-07 LAB — MAGNESIUM: MAGNESIUM: 1.9 mg/dL (ref 1.7–2.4)

## 2016-10-07 MED ORDER — HYDROCODONE-ACETAMINOPHEN 5-325 MG PO TABS: 1.0000 | ORAL_TABLET | Freq: Two times a day (BID) | ORAL | 0 refills | Status: DC | PRN

## 2016-10-07 MED ORDER — GABAPENTIN 300 MG PO CAPS: 300.0000 mg | ORAL_CAPSULE | Freq: Three times a day (TID) | ORAL | Status: DC

## 2016-10-07 MED ORDER — ENSURE ENLIVE PO LIQD: 237.0000 mL | Freq: Two times a day (BID) | ORAL | 12 refills | Status: DC

## 2016-10-07 MED ORDER — CEPHALEXIN 500 MG PO CAPS: 500.0000 mg | ORAL_CAPSULE | Freq: Two times a day (BID) | ORAL | Status: DC

## 2016-10-07 MED ORDER — CEPHALEXIN 500 MG PO CAPS: 500.0000 mg | ORAL_CAPSULE | Freq: Two times a day (BID) | ORAL | 0 refills | Status: AC

## 2016-10-07 NOTE — Discharge Summary (Signed)
Physician Discharge Summary  Dawn [name] MOQ:[id number] DOB: July 08, 1940 DOA: 10/03/2016  PCP: [name], MD  Admit date: 10/03/2016 Discharge date: 10/07/2016  Admitted From: SNF Disposition:  SNF - [facility]  Recommendations for Outpatient Follow-up:  1. Follow up with PCP in 1-2 weeks 2. Follow up with Neurology as an outpatient 3. Follow up with Orthopedic Surgery Dr. Doran Durand next Friday 10/5/ for Left ankle Fracture; Continue NWB and Elevation and ice treatment 4. Follow up with Dr. Curt Bears in Cardiology as an outpatient  5. Please obtain CMP/CBC, Mag, Phos in one week  Home Health: No  Equipment/Devices: None recommended by PT   Discharge Condition: Stable CODE STATUS: FULL CODE Diet recommendation: Regular Diet  Brief/Interim Summary: Dawn [name] 76 year old female with medical history significant for COPD, anxiety, depression and recurrent UTIs. Was sent by her nursing home for somnolence and hypoxemia. At her facility she was found to be hypoxic with O2 saturation around the 70s she was administered 4 L of nasal cannula with improvement of O2 saturation to 90%. Per Patient she has been on Home O2 for almost a year now. Patient reported no recollection of this event. Upon ED evaluation UA was found to be grossly of normal and she was admitted for UTI and IV antibiotics. UTI showed >100,000 CFU of E Coli. Patient also found to have a COPD Exacerbation which has improved. Hospitalization was complicated by AKI which has improved and patient's Hb/Hct dropped yesterday and she was worked up with an FOBT which was negative. Patient improved and was deemed medically stable to D/C back to SNF and she will need to follow up with PCP, Neurology, Cardiology and Orthopedics in the outpatient setting.   Discharge Diagnoses:  Active Problems:   AKI (acute kidney injury) ([place])   COPD (chronic obstructive pulmonary disease) (HCC)   Delirium   SVT (supraventricular  tachycardia) (HCC)   Altered mental status   Ankle fracture   Neuropathy   Chronic diastolic CHF (congestive heart failure) (HCC)  Acute metabolic encephalopathylikely secondary to Hypoxia and E Coli UTI with concern for Polypharmacy, improved    -Obtained CT head - was not ordered on the ED; Showed No CT evidence for acute intracranial abnormality. Atrophy and small vessel ischemic changes of the white matter  -Continue abx - Ceftriaxone  -No urine cultures obtained on admission, sent add on and showing >100,000 CFU of E. Coli - Sensitive to Ceftriaxone so will continue  -[place] Home O2 -Changed Gabapentin Dose to 300 mg po TID  E Coli UTI -Urinalysis was Abnormal -Urine Cx showed >100,000 CFU of E. Coli -Was on 5 days of IV Ceftriaxone and will change to po Keflex 500 mg po BID  COPD Exacerbation, improved  -Wheezing and Hypoxia yesterday but not as much  -C/w Budesonide 0.5 mg Neb BID and Arformoterol 15 mcg Neb BID -Continue Xopenex 1.25 mg Neb q4hprn -C/w Supplemental O2 and wean as tolerated; keep O2 sat > 89% -Patient wears Home O2 and will continue at D/C   Acute Respiratory Failure with Hypoxia 2/2 to COPD, stable -As Above -Repeat CXR in AM -Wean O2 as Tolerated; PER patient she wears 2.5-4 Liters continuously and has for over 1 year now -Follow up as an outpatient and Repeat CXR   AKI, improving -Cr upon admission 1.8 - baseline 1.0 -Treated with IVF - Cr down to 1.15 but worsened yestarday so started back on IVF with NS at 75 mL/hr and improved to 1.21  -Continue to  Monitor and Repeat CMP as an outpatient   L Ankle Fracture  9/23 - outpatient follow up with Dr Alvan Dame Cut down on Gabapentin dose -Pain Control with Acetaminophen 650 mg po q6hprn and Hydrocodone-Acetaminophen 1 tab po q6hprn  -PT/OT Eval and Treat recommending SNF -Discussed with Dr. Alvan Dame and he recommended continuing to Elevate Leg, remain NWB status, and follow up with Dr. Doran Durand in  Orthopedics on 10/14/16  Hx of SVT - stable  -Continue Digoxin 0.125 mg T-Th-S-Su and 0.25 mg po M-W-F and -C/w Flecanide 50 mg po BID -Sees cardiology Dr Curt Bears as outpatient  -Continued Telemetry while hospitalized  Neuropathy and Hx of Spinal Stenosis especially in Neck  - Continue Gabapentin 300 mg po TID; Dose was reduced because of Falls   Depression/Anxiety - Continue Mirtazapine 15 mg po daily and Sertraline 50 mg po Daily  GERD: - Continue PPI Pantoprazole 40 mg Daily  Macrocytic Anemia -Hb/Hct dropped from 10.8/34.3 -> 9.7/32.2 -> 8.8/29.0 but improved to 9.0/ -Checked FOBT and was Negative x3 -Type and Screen done -Repeat CBC at SNF  Discharge Instructions  Discharge Instructions    Call MD for:  difficulty breathing, headache or visual disturbances    Complete by:  As directed    Call MD for:  extreme fatigue    Complete by:  As directed    Call MD for:  hives    Complete by:  As directed    Call MD for:  persistant dizziness or light-headedness    Complete by:  As directed    Call MD for:  persistant nausea and vomiting    Complete by:  As directed    Call MD for:  redness, tenderness, or signs of infection (pain, swelling, redness, odor or green/yellow discharge around incision site)    Complete by:  As directed    Call MD for:  severe uncontrolled pain    Complete by:  As directed    Call MD for:  temperature >100.4    Complete by:  As directed    Diet - low sodium heart healthy    Complete by:  As directed    Discharge instructions    Complete by:  As directed    Follow up with PCP, Orthopedics, Neurology, and Cardiology as an outpatient. Take all medications as prescribed. If symptoms change or worsen please return to the ED for evaluation.   Increase activity slowly    Complete by:  As directed      Allergies as of 10/07/2016   No Known Allergies     Medication List    STOP taking these medications   gabapentin 600 MG tablet Commonly  known as:  NEURONTIN Replaced by:  gabapentin 300 MG capsule   ketorolac 0.4 % Soln Commonly known as:  ACULAR   metoprolol tartrate 25 MG tablet Commonly known as:  LOPRESSOR   traZODone 100 MG tablet Commonly known as:  DESYREL   UNABLE TO FIND     TAKE these medications   bisacodyl 10 MG suppository Commonly known as:  DULCOLAX Place 10 mg rectally as needed for moderate constipation.   cephALEXin 500 MG capsule Commonly known as:  KEFLEX Take 1 capsule (500 mg total) by mouth every 12 (twelve) hours.   CLEAR EYES REDNESS RELIEF 0.012-0.2 % Soln Generic drug:  naphazoline-glycerin Place 1 drop into both eyes 2 (two) times daily.   digoxin 0.25 MG tablet Commonly known as:  LANOXIN Take 0.25 mg by mouth every Monday, Wednesday,  and Friday. What changed:  Another medication with the same name was changed. Make sure you understand how and when to take each.   digoxin 0.125 MG tablet Commonly known as:  LANOXIN Take 1 tablet (0.125 mg total) by mouth daily. What changed:  when to take this  additional instructions   docusate sodium 100 MG capsule Commonly known as:  COLACE Take 1 capsule (100 mg total) by mouth 2 (two) times daily as needed for mild constipation.   feeding supplement (ENSURE ENLIVE) Liqd Take 237 mLs by mouth 2 (two) times daily between meals.   ferrous sulfate 325 (65 FE) MG tablet Take 325 mg by mouth daily with breakfast.   flecainide 50 MG tablet Commonly known as:  TAMBOCOR Take 50 mg by mouth 2 (two) times daily.   gabapentin 300 MG capsule Commonly known as:  NEURONTIN Take 1 capsule (300 mg total) by mouth 3 (three) times daily. Replaces:  gabapentin 600 MG tablet   HYDROcodone-acetaminophen 5-325 MG tablet Commonly known as:  NORCO/VICODIN Take 1 tablet by mouth every 12 (twelve) hours as needed for severe pain. What changed:  when to take this   INCRUSE ELLIPTA 62.5 MCG/INH Aepb Generic drug:  umeclidinium bromide Inhale 1  puff into the lungs daily.   levalbuterol 1.25 MG/3ML nebulizer solution Commonly known as:  XOPENEX Take 1.25 mg by nebulization every 6 (six) hours as needed for wheezing.   levalbuterol 0.63 MG/3ML nebulizer solution Commonly known as:  XOPENEX Take 0.63 mg by nebulization 2 (two) times daily.   magnesium hydroxide 400 MG/5ML suspension Commonly known as:  MILK OF MAGNESIA Take 30 mLs by mouth daily as needed for mild constipation.   metoprolol succinate 25 MG 24 hr tablet Commonly known as:  TOPROL-XL Take 25 mg by mouth at bedtime.   mirtazapine 15 MG tablet Commonly known as:  REMERON Take 15 mg by mouth at bedtime.   pantoprazole 40 MG tablet Commonly known as:  PROTONIX Take 40 mg by mouth daily.   PREDNISOLONE ACETATE P-F 1 % ophthalmic suspension Generic drug:  prednisoLONE acetate Place 1 drop into the left eye daily. Due to stop on 10/08/16   RA SALINE ENEMA 19-7 GM/118ML Enem Place 1 each rectally as needed (for constipation).   sertraline 50 MG tablet Commonly known as:  ZOLOFT Take 50 mg by mouth daily.            Durable Medical Equipment        Start     Ordered   10/07/16 1235  DME Oxygen  Once    Question Answer Comment  Mode or (Route) Nasal cannula   Liters per Minute 4   Frequency Continuous (stationary and portable oxygen unit needed)   Oxygen delivery system Gas      10/07/16 1236       Discharge Care Instructions        Start     Ordered   10/08/16 0000  cephALEXin (KEFLEX) 500 MG capsule  Every 12 hours     10/07/16 1236   10/07/16 0000  gabapentin (NEURONTIN) 300 MG capsule  3 times daily     10/07/16 1236   10/07/16 0000  HYDROcodone-acetaminophen (NORCO/VICODIN) 5-325 MG tablet  Every 12 hours PRN     10/07/16 1236   10/07/16 0000  feeding supplement, ENSURE ENLIVE, (ENSURE ENLIVE) LIQD  2 times daily between meals     10/07/16 1236   10/07/16 0000  Increase activity slowly  10/07/16 1236   10/07/16 0000  Diet -  low sodium heart healthy     10/07/16 1236   10/07/16 0000  Discharge instructions    Comments:  Follow up with PCP, Orthopedics, Neurology, and Cardiology as an outpatient. Take all medications as prescribed. If symptoms change or worsen please return to the ED for evaluation.   10/07/16 1236   10/07/16 0000  Call MD for:  temperature >100.4     10/07/16 1236   10/07/16 0000  Call MD for:  persistant nausea and vomiting     10/07/16 1236   10/07/16 0000  Call MD for:  severe uncontrolled pain     10/07/16 1236   10/07/16 0000  Call MD for:  redness, tenderness, or signs of infection (pain, swelling, redness, odor or green/yellow discharge around incision site)     10/07/16 1236   10/07/16 0000  Call MD for:  difficulty breathing, headache or visual disturbances     10/07/16 1236   10/07/16 0000  Call MD for:  persistant dizziness or light-headedness     10/07/16 1236   10/07/16 0000  Call MD for:  extreme fatigue     10/07/16 1236   10/07/16 0000  Call MD for:  hives     10/07/16 1236      No Known Allergies  Consultations:  Discussed Case with Orthopedic Surgeon Dr. Rolena Infante and Dr. Alvan Dame  Procedures/Studies: Dg Tibia/fibula Left  Result Date: 10/02/2016 CLINICAL DATA:  [place].  Left leg pain. EXAM: LEFT TIBIA AND FIBULA - 2 VIEW COMPARISON:  None. FINDINGS: Bimalleolar ankle fractures are again demonstrated. No fractures of the tibia or fibular shaft. The knee joint is maintained. IMPRESSION: Bimalleolar ankle fractures. No fractures of the tibial or fibular shafts. Electronically Signed   By: Marijo Sanes M.D.   On: 10/02/2016 13:20   Dg Ankle Complete Left  Result Date: 10/02/2016 CLINICAL DATA:  [place].  Left ankle pain. EXAM: LEFT ANKLE COMPLETE - 3+ VIEW COMPARISON:  None. FINDINGS: Bimalleolar ankle fractures noted. There is an oblique coursing relatively nondisplaced distal fibular fracture at and above the level of the ankle mortise. There is also a transverse nondisplaced  fracture involving the medial malleolus at the level of the ankle mortise. No fracture of the posterior tibia is identified. The tibiotalar and subtalar joints are maintained. No mid or hindfoot fractures are identified. IMPRESSION: Relatively nondisplaced bimalleolar ankle fractures. Electronically Signed   By: Marijo Sanes M.D.   On: 10/02/2016 13:19   Ct Head Wo Contrast  Result Date: 10/04/2016 CLINICAL DATA:  Metabolic encephalopathy EXAM: CT HEAD WITHOUT CONTRAST TECHNIQUE: Contiguous axial images were obtained from the base of the skull through the vertex without intravenous contrast. COMPARISON:  01/23/2016 FINDINGS: Brain: No acute territorial infarction, hemorrhage or intracranial mass is seen. Mild atrophy and small vessel ischemic changes of the white matter. Stable ventricle size. Vascular: No hyperdense vessels.  Carotid artery calcifications. Skull: No fracture or suspicious bone lesion. Sinuses/Orbits: Mild mucosal thickening in the ethmoid sinuses. Fluid level in the left maxillary sinus. No acute orbital abnormality. Bilateral lens extraction. Other: None IMPRESSION: 1. No CT evidence for acute intracranial abnormality. 2. Atrophy and small vessel ischemic changes of the white matter. Electronically Signed   By: Donavan Foil M.D.   On: 10/04/2016 18:52   Dg Chest Port 1 View  Result Date: 10/04/2016 CLINICAL DATA:  Shortness of breath, cough, congestion EXAM: PORTABLE CHEST 1 VIEW COMPARISON:  01/23/2016 FINDINGS: There is  hyperinflation of the lungs compatible with COPD. Cardiomegaly with vascular congestion and interstitial prominence, likely mild interstitial edema. No confluent opacities or effusions. IMPRESSION: COPD/hyperinflation. Cardiomegaly.  Suspect mild interstitial edema. Electronically Signed   By: Rolm Baptise M.D.   On: 10/04/2016 10:41   Subjective: Seen and examined at bedside and was doing better. Had a headache this AM but went away. No CP or SOB. Denied any other  concerns or complaints.   Discharge Exam: Vitals:   10/07/16 0814 10/07/16 1002  BP:    Pulse:  75  Resp:    Temp:    SpO2: 95%    Vitals:   10/06/16 2149 10/07/16 0511 10/07/16 0814 10/07/16 1002  BP:  (!) 149/76    Pulse: 72 69  75  Resp: 18 18    Temp:  98.1 F (36.7 C)    TempSrc:  Oral    SpO2: 97% 98% 95%   Weight:      Height:       General: Pt is alert, awake, not in acute distress Cardiovascular: RRR, S1/S2 +, no rubs, no gallops Respiratory: Diminished bilaterally, no wheezing, no rhonchi Abdominal: Soft, NT, ND, bowel sounds + Extremities: no edema, no cyanosis; Left Leg Ankle in splint and wrapped  The results of significant diagnostics from this hospitalization (including imaging, microbiology, ancillary and laboratory) are listed below for reference.    Microbiology: Recent Results (from the past 240 hour(s))  MRSA PCR Screening     Status: None   Collection Time: 10/03/16 11:35 PM  Result Value Ref Range Status   MRSA by PCR NEGATIVE NEGATIVE Final    Comment:        The GeneXpert MRSA Assay (FDA approved for NASAL specimens only), is one component of a comprehensive MRSA colonization surveillance program. It is not intended to diagnose MRSA infection nor to guide or monitor treatment for MRSA infections.   Culture, Urine     Status: Abnormal   Collection Time: 10/04/16  8:00 AM  Result Value Ref Range Status   Specimen Description URINE, RANDOM  Final   Special Requests NONE  Final   Culture >=100,000 COLONIES/mL ESCHERICHIA COLI (A)  Final   Report Status 10/06/2016 FINAL  Final   Organism ID, Bacteria ESCHERICHIA COLI (A)  Final      Susceptibility   Escherichia coli - MIC*    AMPICILLIN >=32 RESISTANT Resistant     CEFAZOLIN <=4 SENSITIVE Sensitive     CEFTRIAXONE <=1 SENSITIVE Sensitive     CIPROFLOXACIN >=4 RESISTANT Resistant     GENTAMICIN >=16 RESISTANT Resistant     IMIPENEM <=0.25 SENSITIVE Sensitive     NITROFURANTOIN <=16  SENSITIVE Sensitive     TRIMETH/SULFA >=320 RESISTANT Resistant     AMPICILLIN/SULBACTAM 16 INTERMEDIATE Intermediate     PIP/TAZO <=4 SENSITIVE Sensitive     Extended ESBL NEGATIVE Sensitive     * >=100,000 COLONIES/mL ESCHERICHIA COLI    Labs: BNP (last 3 results) No results for input(s): BNP in the last 8760 hours. Basic Metabolic Panel:  Recent Labs Lab 10/04/16 0659 10/05/16 0342 10/06/16 0321 10/06/16 1943 10/07/16 0347  NA 141 139 140 141 140  K 4.4 4.9 4.2 4.0 3.5  CL 106 102 102 102 103  CO2 30 29 32 31 31  GLUCOSE 92 98 90 104* 96  BUN 21* 18 23* 23* 20  CREATININE 1.22* 1.15* 1.54* 1.36* 1.21*  CALCIUM 8.3* 8.5* 8.4* 8.0* 8.1*  MG  --   --  2.0  --  1.9  PHOS  --   --  3.0  --  2.6   Liver Function Tests:  Recent Labs Lab 10/06/16 0321 10/07/16 0347  AST 22 15  ALT 16 12*  ALKPHOS 88 88  BILITOT 0.7 0.5  PROT 6.4* 6.0*  ALBUMIN 2.7* 2.8*   No results for input(s): LIPASE, AMYLASE in the last 168 hours. No results for input(s): AMMONIA in the last 168 hours. CBC:  Recent Labs Lab 10/03/16 0457 10/04/16 0659 10/05/16 0342 10/06/16 0321 10/07/16 0347  WBC 8.1 6.1 7.3 5.1 5.4  NEUTROABS 6.9  --   --  2.9 3.0  HGB 10.8* 9.4* 9.7* 8.8* 9.0*  HCT 35.3* 31.6* 32.2* 29.0* 29.0*  MCV 99.7 100.6* 100.0 100.3* 99.7  PLT 168 130* 158 142* 160   Cardiac Enzymes: No results for input(s): CKTOTAL, CKMB, CKMBINDEX, TROPONINI in the last 168 hours. BNP: Invalid input(s): POCBNP CBG: No results for input(s): GLUCAP in the last 168 hours. D-Dimer No results for input(s): DDIMER in the last 72 hours. Hgb A1c No results for input(s): HGBA1C in the last 72 hours. Lipid Profile No results for input(s): CHOL, HDL, LDLCALC, TRIG, CHOLHDL, LDLDIRECT in the last 72 hours. Thyroid function studies No results for input(s): TSH, T4TOTAL, T3FREE, THYROIDAB in the last 72 hours.  Invalid input(s): FREET3 Anemia work up No results for input(s): VITAMINB12,  FOLATE, FERRITIN, TIBC, IRON, RETICCTPCT in the last 72 hours. Urinalysis    Component Value Date/Time   COLORURINE AMBER (A) 10/03/2016 0800   APPEARANCEUR CLOUDY (A) 10/03/2016 0800   LABSPEC 1.021 10/03/2016 0800   PHURINE 5.0 10/03/2016 0800   GLUCOSEU NEGATIVE 10/03/2016 0800   HGBUR NEGATIVE 10/03/2016 0800   BILIRUBINUR NEGATIVE 10/03/2016 0800   KETONESUR NEGATIVE 10/03/2016 0800   PROTEINUR 30 (A) 10/03/2016 0800   UROBILINOGEN 0.2 04/29/2008 2235   NITRITE NEGATIVE 10/03/2016 0800   LEUKOCYTESUR LARGE (A) 10/03/2016 0800   Sepsis Labs Invalid input(s): PROCALCITONIN,  WBC,  LACTICIDVEN Microbiology Recent Results (from the past 240 hour(s))  MRSA PCR Screening     Status: None   Collection Time: 10/03/16 11:35 PM  Result Value Ref Range Status   MRSA by PCR NEGATIVE NEGATIVE Final    Comment:        The GeneXpert MRSA Assay (FDA approved for NASAL specimens only), is one component of a comprehensive MRSA colonization surveillance program. It is not intended to diagnose MRSA infection nor to guide or monitor treatment for MRSA infections.   Culture, Urine     Status: Abnormal   Collection Time: 10/04/16  8:00 AM  Result Value Ref Range Status   Specimen Description URINE, RANDOM  Final   Special Requests NONE  Final   Culture >=100,000 COLONIES/mL ESCHERICHIA COLI (A)  Final   Report Status 10/06/2016 FINAL  Final   Organism ID, Bacteria ESCHERICHIA COLI (A)  Final      Susceptibility   Escherichia coli - MIC*    AMPICILLIN >=32 RESISTANT Resistant     CEFAZOLIN <=4 SENSITIVE Sensitive     CEFTRIAXONE <=1 SENSITIVE Sensitive     CIPROFLOXACIN >=4 RESISTANT Resistant     GENTAMICIN >=16 RESISTANT Resistant     IMIPENEM <=0.25 SENSITIVE Sensitive     NITROFURANTOIN <=16 SENSITIVE Sensitive     TRIMETH/SULFA >=320 RESISTANT Resistant     AMPICILLIN/SULBACTAM 16 INTERMEDIATE Intermediate     PIP/TAZO <=4 SENSITIVE Sensitive     Extended ESBL NEGATIVE  Sensitive     * >=  100,000 COLONIES/mL ESCHERICHIA COLI   Time coordinating discharge: 35 minutes  SIGNED:  Kerney Elbe, DO [facility] 10/07/2016, 12:37 PM Pager [phone]  If 7PM-7AM, please contact night-coverage [email or web address] Password [email or web address]

## 2016-10-07 NOTE — Progress Notes (Signed)
Dawn [name] to be D/C'd to SNF per MD order.  Discussed with the patient and all questions fully answered.  VSS, Skin clean, dry and intact without evidence of skin break down, no evidence of skin tears noted. IV catheter discontinued intact. Site without signs and symptoms of complications. Dressing and pressure applied.  An After Visit Summary was printed and given to the patient. [facility] received prescriptions.  Report called to receiving nurse at [facility] facility.  D/c education completed with patient/family including follow up instructions, medication list, d/c activities limitations if indicated, with other d/c instructions as indicated by MD - patient able to verbalize understanding, all questions fully answered.   Patient instructed to return to ED, call 911, or call MD for any changes in condition.   Patient escorted via stretcher, and D/C to [facility] via [place].  Lynann Beaver 10/07/2016 3:46 PM

## 2016-10-07 NOTE — Clinical Social Work Placement (Signed)
   CLINICAL SOCIAL WORK PLACEMENT  NOTE  Date:  10/07/2016  Patient Details  Name: Dawn [name] MRN: [id number] Date of Birth: 23-Jan-1940  Clinical Social Work is seeking post-discharge placement for this patient at the [place] level of care (*CSW will initial, date and re-position this form in  chart as items are completed):  Yes   Patient/family provided with [place] Work Department's list of facilities offering this level of care within the geographic area requested by the patient (or if unable, by the patient's family).  Yes   Patient/family informed of their freedom to choose among providers that offer the needed level of care, that participate in Medicare, Medicaid or managed care program needed by the patient, have an available bed and are willing to accept the patient.  Yes   Patient/family informed of [facility]'s ownership interest in [facility] and [facility], as well as of the fact that they are under no obligation to receive care at these facilities.  PASRR submitted to EDS on       PASRR number received on       Existing PASRR number confirmed on 10/06/16     FL2 transmitted to all facilities in geographic area requested by pt/family on       FL2 transmitted to all facilities within larger geographic area on 10/06/16     Patient informed that his/her managed care company has contracts with or will negotiate with certain facilities, including the following:        Yes   Patient/family informed of bed offers received.  Patient chooses bed at [facility] recommends and patient chooses bed at      Patient to be transferred to [facility] on 10/07/16.  Patient to be transferred to facility by [facility]     Patient family notified on 10/07/16 of transfer.  Name of family member notified:  daughter, [name] notified     PHYSICIAN Please prepare prescriptions     Additional  Comment:    _______________________________________________ Normajean Baxter, LCSW 10/07/2016, 12:43 PM

## 2016-10-07 NOTE — Care Management Important Message (Signed)
Important Message  Patient Details  Name: Dawn [name] MRN: [id number] Date of Birth: 05/08/1940   Medicare Important Message Given:  Yes    [name] Abena 10/07/2016, 3:30 PM

## 2016-10-07 NOTE — Social Work (Signed)
Clinical Social Worker facilitated patient discharge including contacting patient family and facility to confirm patient discharge plans.  Clinical information faxed to facility and family agreeable with plan.    CSW arranged ambulance transport via [facility] to [facility].   RN to call [phone] to give report prior to discharge.  Clinical Social Worker will sign off for now as social work intervention is no longer needed. Please consult [place] again if new need arises.  Elissa Hefty, LCSW Clinical Social Worker [phone]

## 2016-10-11 ENCOUNTER — Encounter: Payer: Self-pay | Admitting: Internal Medicine

## 2016-10-11 ENCOUNTER — Non-Acute Institutional Stay (SKILLED_NURSING_FACILITY): Payer: Medicare Other | Admitting: Internal Medicine

## 2016-10-11 DIAGNOSIS — S82842S Displaced bimalleolar fracture of left lower leg, sequela: Secondary | ICD-10-CM | POA: Diagnosis not present

## 2016-10-11 DIAGNOSIS — Z9189 Other specified personal risk factors, not elsewhere classified: Secondary | ICD-10-CM

## 2016-10-11 DIAGNOSIS — R296 Repeated falls: Secondary | ICD-10-CM | POA: Diagnosis not present

## 2016-10-11 NOTE — Assessment & Plan Note (Addendum)
When she is ambulatory again, check postural pressures

## 2016-10-11 NOTE — Progress Notes (Signed)
NURSING HOME LOCATION:  [facility] ROOM NUMBER:  [id number]  CODE STATUS:  Full Code  PCP:  [name], MD  [address] [place] [address]   This is a nursing facility follow up for [facility] readmission within 30 days  Interim medical record and care since last [place] visit was updated with review of diagnostic studies and change in clinical status since last visit were documented.  HPI: The patient fell 9/23 twisting the left ankle @the  SNF. She was ambulating to the bathroom with her walker, having been out of bed just a few seconds. Mobile imaging had suggested ankle fracture prompting referral to ED.  Bimalleolar ankle fracture was confirmed, relatively nondisplaced.The patient was splinted and follow-up was scheduled with Dr. Alvan Dame, Orthopedics.  The patient was sent back to the ED 9/24 with oxygen saturations in the 70% associated with clamminess and somnolence. This was corrected to the 90s with 4 L of oxygen.  Speech was noted to be slurred. CT of the head 9/25 revealed no acute intracranial abnormality.  She did have atrophy and small vessel ischemic changes. The patient has no memory of the 9/23 ED visit, return to the  facility or the return to the ED admission 9/24. The patient was hospitalized 9/24-9/28/18. Urine revealed many bacteria, 6-30 red cells, white blood cells in clumps, and white blood cells too numerous to count. Urine culture revealed over 100,000 Escherichia coli. Ceftriaxone was administered. Additionally clinically she had a COPD exacerbation. Hospitalization was complicated by AKI. Additionally a drop in her hemoglobin and hematocrit was noted. FOBT testing was negative. Acute metabolic encephalopathy was thought to be due to the hypoxia & E coli UTI. Creatinine at admission was 1.8, baseline creatinine is 1. The AKI improved with hydration. She will see Dr. Doran Durand 10/5. She was to continue nonweightbearing, elevation, and ice  treatment. In reviewing the SNF records the patient had been on trazodone which had been increased from 50 mg to 100 mg at bedtime.Apparently the original fall was early one morning.Trazodone had been discontinued inthe hospital.  Review of systems: She describes chronic dyspnea and is on 2 L of oxygen daily. She is receiving PT/OT in a wheelchair. She continues to be anxious. She was concerned that I will reduce her pain medications.  Constitutional: No fever,significant weight change, fatigue  Eyes: No redness, discharge, pain, vision change ENT/mouth: No nasal congestion,  purulent discharge, earache,change in hearing ,sore throat  Cardiovascular: No chest pain, palpitations,paroxysmal nocturnal dyspnea, claudication, edema  Respiratory: No cough, sputum production,hemoptysis, significant snoring,apnea  Gastrointestinal: No heartburn,dysphagia,abdominal pain, nausea / vomiting,rectal bleeding, melena,change in bowels Genitourinary: No dysuria,hematuria, pyuria,  incontinence, nocturia Musculoskeletal: No joint stiffness, joint swelling, weakness,pain Dermatologic: No rash, pruritus, change in appearance of skin Neurologic: No dizziness,headache,syncope, seizures, numbness , tingling Endocrine: No change in hair/skin/ nails, excessive thirst, excessive hunger, excessive urination  Hematologic/lymphatic: No significant bruising, lymphadenopathy,abnormal bleeding Allergy/immunology: No itchy/ watery eyes, significant sneezing, urticaria, angioedema  Physical exam:  Pertinent or positive findings: Bilateral ptosis is present. She has slight hirsutism of the chin. She has an upper plate. The mandible is edentulous. A right carotid bruit is suggested. Second heart sound is increased. Left lower extremity is wrapped. Pedal pulses are decreased in the right lower extremity. She has clubbing of the nailbeds. She is weak to opposition in upper extremities  General appearance:Adequately nourished; no  acute distress , increased work of breathing is present.   Lymphatic: No lymphadenopathy about  the head, neck, axilla . Eyes: No conjunctival inflammation or lid edema is present. There is no scleral icterus. Ears:  External ear exam shows no significant lesions or deformities.   Nose:  External nasal examination shows no deformity or inflammation. Nasal mucosa are pink and moist without lesions ,exudates Oral exam: lips and gums are healthy appearing.There is no oropharyngeal erythema or exudate . Neck:  No thyromegaly, masses, tenderness noted.    Heart:  Normal rate and regular rhythm. S1  normal without gallop, murmur, click, rub .  Lungs:Chest clear to auscultation without wheezes, rhonchi,rales , rubs. Abdomen:Bowel sounds are normal. Abdomen is soft and nontender with no organomegaly, hernias,masses. GU: deferred  Extremities:  No cyanosis,edema  Skin: Warm & dry w/o tenting. No significant lesions or rash.  See summary under each active problem in the Problem List with associated updated therapeutic plan

## 2016-10-11 NOTE — Assessment & Plan Note (Addendum)
Risk again reviewed No reduction in opiods unless she continues to fall

## 2016-10-11 NOTE — Patient Instructions (Signed)
See assessment and plan under each diagnosis in the problem list and acutely for this visit 

## 2016-10-13 NOTE — Assessment & Plan Note (Signed)
Wean opiods if falls again Check postural BPs when ambulatory

## 2016-10-17 DIAGNOSIS — S82842A Displaced bimalleolar fracture of left lower leg, initial encounter for closed fracture: Secondary | ICD-10-CM | POA: Diagnosis not present

## 2016-10-28 ENCOUNTER — Other Ambulatory Visit: Payer: Self-pay

## 2016-10-28 NOTE — Telephone Encounter (Signed)
The SNF facility requested a refill on Norco.  Have not received a request from the pharmacy.  Last Rx written on 09/05/16 for 60 tablets.  Hard copy of script was given to second shift nurse on [address]).  I observed her faxing the script to the pharmacy.

## 2016-11-03 ENCOUNTER — Non-Acute Institutional Stay (SKILLED_NURSING_FACILITY): Payer: Medicare Other | Admitting: Internal Medicine

## 2016-11-03 ENCOUNTER — Encounter: Payer: Self-pay | Admitting: Internal Medicine

## 2016-11-03 DIAGNOSIS — N183 Chronic kidney disease, stage 3 unspecified: Secondary | ICD-10-CM

## 2016-11-03 DIAGNOSIS — R0789 Other chest pain: Secondary | ICD-10-CM

## 2016-11-03 DIAGNOSIS — I959 Hypotension, unspecified: Secondary | ICD-10-CM | POA: Diagnosis not present

## 2016-11-03 DIAGNOSIS — D500 Iron deficiency anemia secondary to blood loss (chronic): Secondary | ICD-10-CM

## 2016-11-03 DIAGNOSIS — E0590 Thyrotoxicosis, unspecified without thyrotoxic crisis or storm: Secondary | ICD-10-CM

## 2016-11-03 NOTE — Progress Notes (Signed)
NURSING HOME LOCATION:  [facility] ROOM NUMBER:  [id number]  CODE STATUS:  Full Code  PCP:  [name], MD  [place] [address]  This is a nursing facility follow up for specific acute issue of "uncontrollable pain", hypotension, and bradycardia.  Interim medical record and care since last [facility] visit was updated with review of diagnostic studies and change in clinical status since last visit were documented.  HPI: The patient describes pain in the left posterior  inferior thoracic/flank area for the last week , worse in the mornings. It is increased if she turns to the RLDP & with a deep breath. It is not aggravated by her participation in PT/OT. She questions whether she might have injured herself in the fall in which she fractured her left ankle. She denies associated cardiac,pulmonary, GI, or GU symptoms. In reference to blood pressure and pulse, she is on metoprolol extended release 25 mg at bedtime Tambocor 50 mg twice a day, and digoxin 0.125 Tuesday, Thursday, Saturday, and Sunday. The dose is 0.25 on Monday, Wednesday, and Friday. The last digoxin level was 0.7 on 10/06/16. TSH was therapeutic at 2.99 on 9/24. On 8/13 the TSH was 10.13. Previously on Tapazole; not on  Amiodarone. Labs are not current, on 9/28 creatinine was 1.21, calcium 8.1, albumin 2.8, GFR 42, hemoglobin 9, and hematocrit 29 indices were normochromic, normocytic.  Review of systems:  Constitutional: No fever,significant weight change, fatigue  Eyes: No redness, discharge, pain, vision change ENT/mouth: No nasal congestion,  purulent discharge, earache,change in hearing ,sore throat  Cardiovascular: No palpitations,paroxysmal nocturnal dyspnea, claudication, edema  Respiratory: No cough, sputum production,hemoptysis, significant snoring,apnea  Gastrointestinal: No heartburn,dysphagia,abdominal pain, nausea / vomiting,rectal bleeding, melena,change in bowels Genitourinary: No  dysuria,hematuria, pyuria,  incontinence, nocturia Dermatologic: No rash, pruritus, change in appearance of skin Neurologic: No dizziness,headache,syncope, seizures, numbness , tingling Endocrine: No change in hair/skin/ nails, excessive thirst, excessive hunger, excessive urination  Hematologic/lymphatic: No significant bruising, lymphadenopathy,abnormal bleeding Allergy/immunology: No itchy/ watery eyes, significant sneezing, urticaria, angioedema  Physical exam:  Pertinent or positive findings: She appears somewhat pale and chronically ill. Heart rhythm was slow but regular. She does have low-grade juicy rales in the lower lung fields, greater on the left than the right. I hear no rub. Abdomen is protuberant. The right posterior tibial pulse is decreased. The left lower extremity is in a cast.  General appearance:Adequately nourished; no acute distress , increased work of breathing is present.   Lymphatic: No lymphadenopathy about the head, neck, axilla . Eyes: No conjunctival inflammation or lid edema is present. There is no scleral icterus. Ears:  External ear exam shows no significant lesions or deformities.   Nose:  External nasal examination shows no deformity or inflammation. Nasal mucosa are pink and moist without lesions ,exudates Oral exam: lips and gums are healthy appearing.There is no oropharyngeal erythema or exudate . Neck:  No thyromegaly, masses, tenderness noted.    Heart:  No gallop, murmur, click, rub .  Lungs: without wheezes, rhonchi. Abdomen:Bowel sounds are normal. Abdomen is soft and nontender with no organomegaly, hernias,masses. GU: deferred  Extremities:  No cyanosis, clubbing,edema  Neurologic exam : Balance,Rhomberg,finger to nose testing could not be completed due to clinical state Skin: Warm & dry w/o tenting. No significant lesions or rash.  #1 left inferior posterior  axillary line chest pain , worse with deep inspiration and right lateral decubitus  position and mainly in the morning upon awakening #  2 relative hypotension and bradycardia #3 anemia #4 CKD See orders

## 2016-11-04 LAB — BASIC METABOLIC PANEL
BUN: 22 — AB (ref 4–21)
CREATININE: 1.2 — AB (ref 0.5–1.1)
GLUCOSE: 91
POTASSIUM: 4.3 (ref 3.4–5.3)
Sodium: 143 (ref 137–147)

## 2016-11-04 LAB — CBC AND DIFFERENTIAL
HCT: 31 — AB (ref 36–46)
Hemoglobin: 10.2 — AB (ref 12.0–16.0)
Neutrophils Absolute: 2
Platelets: 161 (ref 150–399)
WBC: 5.1

## 2016-11-04 LAB — TSH: TSH: 4.93 (ref 0.41–5.90)

## 2016-11-05 ENCOUNTER — Encounter: Payer: Self-pay | Admitting: Internal Medicine

## 2016-11-05 NOTE — Assessment & Plan Note (Signed)
Recheck TSH as off Tapazole

## 2016-11-05 NOTE — Assessment & Plan Note (Signed)
Recheck BMET 

## 2016-11-05 NOTE — Patient Instructions (Signed)
See assessment and plan under each diagnosis in the problem list and acutely for this visit 

## 2016-11-05 NOTE — Assessment & Plan Note (Signed)
Recheck CBC. 

## 2016-11-07 DIAGNOSIS — S82842D Displaced bimalleolar fracture of left lower leg, subsequent encounter for closed fracture with routine healing: Secondary | ICD-10-CM | POA: Diagnosis not present

## 2016-11-08 ENCOUNTER — Non-Acute Institutional Stay (SKILLED_NURSING_FACILITY): Payer: Medicare Other

## 2016-11-08 DIAGNOSIS — Z0000 Encounter for general adult medical examination without abnormal findings: Secondary | ICD-10-CM

## 2016-11-08 NOTE — Patient Instructions (Signed)
Dawn [name] , Thank you for taking time to come for your Medicare Wellness Visit. I appreciate your ongoing commitment to your health goals. Please review the following plan we discussed and let me know if I can assist you in the future.   Screening recommendations/referrals: Colonoscopy excluded, pt is over age 78 Mammogram excluded, pt is over age 34 Bone Density due, ordered Recommended yearly ophthalmology/optometry visit for glaucoma screening and checkup Recommended yearly dental visit for hygiene and checkup  Vaccinations: Influenza vaccine up to date Pneumococcal vaccine 13 due, ordered Tdap vaccine up to date. Due 01/22/2026 Shingles vaccine not in records    Advanced directives: Need a copy of living will and health care power of attorney in chart  Conditions/risks identified: None  Next appointment: Dr. Linna Darner makes rounds   Preventive Care 4 Years and Older, Female Preventive care refers to lifestyle choices and visits with your health care provider that can promote health and wellness. What does preventive care include?  A yearly physical exam. This is also called an annual well check.  Dental exams once or twice a year.  Routine eye exams. Ask your health care provider how often you should have your eyes checked.  Personal lifestyle choices, including:  Daily care of your teeth and gums.  Regular physical activity.  Eating a healthy diet.  Avoiding tobacco and drug use.  Limiting alcohol use.  Practicing safe sex.  Taking low-dose aspirin every day.  Taking vitamin and mineral supplements as recommended by your health care provider. What happens during an annual well check? The services and screenings done by your health care provider during your annual well check will depend on your age, overall health, lifestyle risk factors, and family history of disease. Counseling  Your health care provider may ask you questions about your:  Alcohol  use.  Tobacco use.  Drug use.  Emotional well-being.  Home and relationship well-being.  Sexual activity.  Eating habits.  History of falls.  Memory and ability to understand (cognition).  Work and work Statistician.  Reproductive health. Screening  You may have the following tests or measurements:  Height, weight, and BMI.  Blood pressure.  Lipid and cholesterol levels. These may be checked every 5 years, or more frequently if you are over 54 years old.  Skin check.  Lung cancer screening. You may have this screening every year starting at age 42 if you have a 30-pack-year history of smoking and currently smoke or have quit within the past 15 years.  Fecal occult blood test (FOBT) of the stool. You may have this test every year starting at age 29.  Flexible sigmoidoscopy or colonoscopy. You may have a sigmoidoscopy every 5 years or a colonoscopy every 10 years starting at age 56.  Hepatitis C blood test.  Hepatitis B blood test.  Sexually transmitted disease (STD) testing.  Diabetes screening. This is done by checking your blood sugar (glucose) after you have not eaten for a while (fasting). You may have this done every 1-3 years.  Bone density scan. This is done to screen for osteoporosis. You may have this done starting at age 69.  Mammogram. This may be done every 1-2 years. Talk to your health care provider about how often you should have regular mammograms. Talk with your health care provider about your test results, treatment options, and if necessary, the need for more tests. Vaccines  Your health care provider may recommend certain vaccines, such as:  Influenza vaccine. This is recommended  every year.  Tetanus, diphtheria, and acellular pertussis (Tdap, Td) vaccine. You may need a Td booster every 10 years.  Zoster vaccine. You may need this after age 21.  Pneumococcal 13-valent conjugate (PCV13) vaccine. One dose is recommended after age  23.  Pneumococcal polysaccharide (PPSV23) vaccine. One dose is recommended after age 30. Talk to your health care provider about which screenings and vaccines you need and how often you need them. This information is not intended to replace advice given to you by your health care provider. Make sure you discuss any questions you have with your health care provider. Document Released: 01/23/2015 Document Revised: 09/16/2015 Document Reviewed: 10/28/2014 Elsevier Interactive Patient Education  2017 [place] Prevention in the Home Falls can cause injuries. They can happen to people of all ages. There are many things you can do to make your home safe and to help prevent falls. What can I do on the outside of my home?  Regularly fix the edges of walkways and driveways and fix any cracks.  Remove anything that might make you trip as you walk through a door, such as a raised step or threshold.  Trim any bushes or trees on the path to your home.  Use bright outdoor lighting.  Clear any walking paths of anything that might make someone trip, such as rocks or tools.  Regularly check to see if handrails are loose or broken. Make sure that both sides of any steps have handrails.  Any raised decks and porches should have guardrails on the edges.  Have any leaves, snow, or ice cleared regularly.  Use sand or salt on walking paths during winter.  Clean up any spills in your garage right away. This includes oil or grease spills. What can I do in the bathroom?  Use night lights.  Install grab bars by the toilet and in the tub and shower. Do not use towel bars as grab bars.  Use non-skid mats or decals in the tub or shower.  If you need to sit down in the shower, use a plastic, non-slip stool.  Keep the floor dry. Clean up any water that spills on the floor as soon as it happens.  Remove soap buildup in the tub or shower regularly.  Attach bath mats securely with double-sided  non-slip rug tape.  Do not have throw rugs and other things on the floor that can make you trip. What can I do in the bedroom?  Use night lights.  Make sure that you have a light by your bed that is easy to reach.  Do not use any sheets or blankets that are too big for your bed. They should not hang down onto the floor.  Have a firm chair that has side arms. You can use this for support while you get dressed.  Do not have throw rugs and other things on the floor that can make you trip. What can I do in the kitchen?  Clean up any spills right away.  Avoid walking on wet floors.  Keep items that you use a lot in easy-to-reach places.  If you need to reach something above you, use a strong step stool that has a grab bar.  Keep electrical cords out of the way.  Do not use floor polish or wax that makes floors slippery. If you must use wax, use non-skid floor wax.  Do not have throw rugs and other things on the floor that can make you trip. What  can I do with my stairs?  Do not leave any items on the stairs.  Make sure that there are handrails on both sides of the stairs and use them. Fix handrails that are broken or loose. Make sure that handrails are as long as the stairways.  Check any carpeting to make sure that it is firmly attached to the stairs. Fix any carpet that is loose or worn.  Avoid having throw rugs at the top or bottom of the stairs. If you do have throw rugs, attach them to the floor with carpet tape.  Make sure that you have a light switch at the top of the stairs and the bottom of the stairs. If you do not have them, ask someone to add them for you. What else can I do to help prevent falls?  Wear shoes that:  Do not have high heels.  Have rubber bottoms.  Are comfortable and fit you well.  Are closed at the toe. Do not wear sandals.  If you use a stepladder:  Make sure that it is fully opened. Do not climb a closed stepladder.  Make sure that both  sides of the stepladder are locked into place.  Ask someone to hold it for you, if possible.  Clearly mark and make sure that you can see:  Any grab bars or handrails.  First and last steps.  Where the edge of each step is.  Use tools that help you move around (mobility aids) if they are needed. These include:  Canes.  Walkers.  Scooters.  Crutches.  Turn on the lights when you go into a dark area. Replace any light bulbs as soon as they burn out.  Set up your furniture so you have a clear path. Avoid moving your furniture around.  If any of your floors are uneven, fix them.  If there are any pets around you, be aware of where they are.  Review your medicines with your doctor. Some medicines can make you feel dizzy. This can increase your chance of falling. Ask your doctor what other things that you can do to help prevent falls. This information is not intended to replace advice given to you by your health care provider. Make sure you discuss any questions you have with your health care provider. Document Released: 10/23/2008 Document Revised: 06/04/2015 Document Reviewed: 01/31/2014 Elsevier Interactive Patient Education  2017 [employer].

## 2016-11-08 NOTE — Progress Notes (Signed)
Subjective:   Dawn [name] is a 76 y.o. female who presents for Medicare Annual (Subsequent) preventive examination at [facility]    Objective:     Vitals: BP 134/68 (BP Location: Right Arm, Patient Position: Sitting)   Pulse (!) 51   Temp 98 F (36.7 C) (Oral)   Ht 5\' 8"  (1.727 m)   Wt 143 lb (64.9 kg)   SpO2 98%   BMI 21.74 kg/m   Body mass index is 21.74 kg/m.   Tobacco History  Smoking Status  . Former Smoker  Smokeless Tobacco  . Never Used    Comment: Smoked age 50-69 up to < 1 ppd, usually half a pack per day, mainly one half pack per day     Counseling given: Not Answered   Past Medical History:  Diagnosis Date  . COPD (chronic obstructive pulmonary disease) ([place])   . History of anxiety   . History of depression   . Spinal stenosis   . UTI (urinary tract infection)    Past Surgical History:  Procedure Laterality Date  . FLEXIBLE SIGMOIDOSCOPY Left 11/18/2015   Procedure: FLEXIBLE SIGMOIDOSCOPY;  Surgeon: [name], MD;  Location: [facility];  Service: Endoscopy;  Laterality: Left;  . FLEXIBLE SIGMOIDOSCOPY N/A 11/20/2015   Procedure: FLEXIBLE SIGMOIDOSCOPY;  Surgeon: [name], MD;  Location: [facility];  Service: Endoscopy;  Laterality: N/A;  . HIP ARTHROPLASTY Right 01/12/2016   Procedure: ARTHROPLASTY BIPOLAR HIP (HEMIARTHROPLASTY);  Surgeon: [name], MD;  Location: [facility];  Service: Orthopedics;  Laterality: Right;   Family History  Problem Relation Age of Onset  . Heart attack Mother   . Diabetes Father   . Thyroid disease Neg Hx    History  Sexual Activity  . Sexual activity: Not Currently    Outpatient Encounter Prescriptions as of 11/08/2016  Medication Sig  . aspirin 81 MG chewable tablet Chew 81 mg by mouth 2 (two) times daily.  . bisacodyl (DULCOLAX) 10 MG suppository Place 10 mg rectally as needed for moderate constipation.  . diclofenac (VOLTAREN) 0.1 % ophthalmic solution 4 (four) times daily.  .  digoxin (LANOXIN) 0.125 MG tablet Take 0.125 mg by mouth See admin instructions. Take Harrell Lark, Sat, Sun  . digoxin (LANOXIN) 0.25 MG tablet Take 0.25 mg by mouth every Monday, Wednesday, and Friday.  . docusate sodium (COLACE) 100 MG capsule Take 1 capsule (100 mg total) by mouth 2 (two) times daily as needed for mild constipation.  . ferrous sulfate 325 (65 FE) MG tablet Take 325 mg by mouth daily with breakfast.   . flecainide (TAMBOCOR) 50 MG tablet Take 50 mg by mouth 2 (two) times daily.  Marland Kitchen gabapentin (NEURONTIN) 300 MG capsule Take 1 capsule (300 mg total) by mouth 3 (three) times daily.  Marland Kitchen HYDROcodone-acetaminophen (NORCO/VICODIN) 5-325 MG tablet Take 1 tablet by mouth every 12 (twelve) hours as needed for severe pain.  . INCRUSE ELLIPTA 62.5 MCG/INH AEPB Inhale 1 puff into the lungs daily.   Marland Kitchen levalbuterol (XOPENEX) 0.63 MG/3ML nebulizer solution Take 0.63 mg by nebulization 2 (two) times daily.  Marland Kitchen levalbuterol (XOPENEX) 1.25 MG/3ML nebulizer solution Take 1.25 mg by nebulization every 6 (six) hours as needed for wheezing.  . magnesium hydroxide (MILK OF MAGNESIA) 400 MG/5ML suspension Take 30 mLs by mouth daily as needed for mild constipation.  . metoprolol succinate (TOPROL-XL) 25 MG 24 hr tablet Take 25 mg by mouth at bedtime.  . mirtazapine (REMERON) 15 MG tablet Take 15  mg by mouth at bedtime.  . naphazoline-glycerin (CLEAR EYES REDNESS RELIEF) 0.012-0.2 % SOLN Place 1 drop into both eyes 2 (two) times daily.  Marland Kitchen NUTRITIONAL SUPPLEMENT LIQD Take 120 mLs by mouth 2 (two) times daily. MedPass  . pantoprazole (PROTONIX) 40 MG tablet Take 40 mg by mouth daily.  . sertraline (ZOLOFT) 50 MG tablet Take 50 mg by mouth daily.  . Sodium Phosphates (RA SALINE ENEMA) 19-7 GM/118ML ENEM Place 1 each rectally as needed (for constipation).   No facility-administered encounter medications on file as of 11/08/2016.     Activities of Daily Living In your present state of health, do you have any  difficulty performing the following activities: 11/08/2016 10/03/2016  Hearing? N N  Vision? N N  Difficulty concentrating or making decisions? Y N  Walking or climbing stairs? Y Y  Dressing or bathing? Y Y  Doing errands, shopping? Y N  Preparing Food and eating ? Y -  Using the Toilet? Y -  In the past six months, have you accidently leaked urine? N -  Do you have problems with loss of bowel control? N -  Managing your Medications? Y -  Managing your Finances? Y -  Housekeeping or managing your Housekeeping? Y -  Some recent data might be hidden    Patient Care Team: [name], MD as PCP - General (Internal Medicine) Medina-Vargas, [name], NP as Nurse Practitioner (Internal Medicine)    Assessment:      Exercise Activities and Dietary recommendations Current Exercise Habits: The patient does not participate in regular exercise at present, Exercise limited by: orthopedic condition(s)  Goals    None     Fall Risk Fall Risk  11/08/2016 03/01/2016 01/20/2016  Falls in the past year? Yes Yes Yes  Number falls in past yr: 2 or more 2 or more 1  Injury with Fall? Yes Yes Yes  Comment R hip, L ankle - femoral neck fracture that required surgery  Risk Factor Category  - High Fall Risk -   Depression Screen PHQ 2/9 Scores 11/08/2016  PHQ - 2 Score 4  PHQ- 9 Score 11     Cognitive Function     6CIT Screen 11/08/2016  What Year? 0 points  What month? 0 points  What time? 0 points  Count back from 20 0 points  Months in reverse 0 points  Repeat phrase 2 points  Total Score 2    Immunization History  Administered Date(s) Administered  . Influenza, High Dose Seasonal PF 10/04/2016  . Influenza,inj,Quad PF,6+ Mos 01/13/2016  . Pneumococcal-Unspecified 10/11/2011  . Tdap 01/23/2016   Screening Tests Health Maintenance  Topic Date Due  . PNA vac Low Risk Adult (2 of 2 - PCV13) 10/10/2012  . DEXA SCAN  08/10/2017 (Originally 07/06/2004)  . TETANUS/TDAP   01/22/2026  . INFLUENZA VACCINE  Completed      Plan:    I have personally reviewed and addressed the Medicare Annual Wellness questionnaire and have noted the following in the patient's chart:  A. Medical and social history B. Use of alcohol, tobacco or illicit drugs  C. Current medications and supplements D. Functional ability and status E.  Nutritional status F.  Physical activity G. Advance directives H. List of other physicians I.  Hospitalizations, surgeries, and ER visits in previous 12 months J.  [place] to include hearing, vision, cognitive, depression L. Referrals and appointments - none  In addition, I have reviewed and discussed with patient  certain preventive protocols, quality metrics, and best practice recommendations. A written personalized care plan for preventive services as well as general preventive health recommendations were provided to patient.  See attached scanned questionnaire for additional information.   Signed,   Rich Reining, RN Nurse Health Advisor   Quick Notes   Health Maintenance: PNA 13, DEXA due.     Abnormal Screen: PHQ-9:11     Patient Concerns: None     Nurse Concerns: None  I have personally reviewed the health advisor's clinical note, was available for consultation, and agree with the assessment and plan as written with the following exceptions: DEXA not needed as fracture hx documents osteoporosis. [name] M.D., FACP, [facility]

## 2016-11-09 ENCOUNTER — Encounter: Payer: Self-pay | Admitting: Adult Health

## 2016-11-09 NOTE — Progress Notes (Signed)
This encounter was created in error - please disregard.

## 2016-11-15 ENCOUNTER — Other Ambulatory Visit: Payer: Self-pay

## 2016-11-15 MED ORDER — HYDROCODONE-ACETAMINOPHEN 5-325 MG PO TABS: 1.0000 | ORAL_TABLET | Freq: Two times a day (BID) | ORAL | 0 refills | Status: DC | PRN

## 2016-11-15 NOTE — Telephone Encounter (Signed)
Hard script written by Durenda Age, NP and given to nurse.    [place] narcotic database checked.

## 2016-11-21 DIAGNOSIS — M25561 Pain in right knee: Secondary | ICD-10-CM | POA: Diagnosis not present

## 2016-11-24 DIAGNOSIS — D649 Anemia, unspecified: Secondary | ICD-10-CM | POA: Diagnosis not present

## 2016-11-24 DIAGNOSIS — D509 Iron deficiency anemia, unspecified: Secondary | ICD-10-CM | POA: Diagnosis not present

## 2016-11-24 DIAGNOSIS — E0590 Thyrotoxicosis, unspecified without thyrotoxic crisis or storm: Secondary | ICD-10-CM | POA: Diagnosis not present

## 2016-11-24 DIAGNOSIS — G894 Chronic pain syndrome: Secondary | ICD-10-CM | POA: Diagnosis not present

## 2016-11-24 LAB — BASIC METABOLIC PANEL
BUN: 15 (ref 4–21)
CREATININE: 1.3 — AB (ref 0.5–1.1)
GLUCOSE: 110
POTASSIUM: 4.7 (ref 3.4–5.3)
Sodium: 140 (ref 137–147)

## 2016-11-25 DIAGNOSIS — B351 Tinea unguium: Secondary | ICD-10-CM | POA: Diagnosis not present

## 2016-11-25 DIAGNOSIS — R262 Difficulty in walking, not elsewhere classified: Secondary | ICD-10-CM | POA: Diagnosis not present

## 2016-11-28 NOTE — Progress Notes (Signed)
[id number] 

## 2016-11-29 ENCOUNTER — Encounter: Payer: Self-pay | Admitting: Adult Health

## 2016-11-29 ENCOUNTER — Non-Acute Institutional Stay (SKILLED_NURSING_FACILITY): Payer: Medicare Other | Admitting: Adult Health

## 2016-11-29 DIAGNOSIS — S82842S Displaced bimalleolar fracture of left lower leg, sequela: Secondary | ICD-10-CM | POA: Diagnosis not present

## 2016-11-29 DIAGNOSIS — J449 Chronic obstructive pulmonary disease, unspecified: Secondary | ICD-10-CM | POA: Diagnosis not present

## 2016-11-29 DIAGNOSIS — G629 Polyneuropathy, unspecified: Secondary | ICD-10-CM

## 2016-11-29 DIAGNOSIS — F339 Major depressive disorder, recurrent, unspecified: Secondary | ICD-10-CM | POA: Diagnosis not present

## 2016-11-29 DIAGNOSIS — D649 Anemia, unspecified: Secondary | ICD-10-CM | POA: Diagnosis not present

## 2016-11-29 DIAGNOSIS — I471 Supraventricular tachycardia: Secondary | ICD-10-CM

## 2016-11-29 NOTE — Progress Notes (Signed)
DATE:  11/29/2016   MRN:  [id number]  BIRTHDAY: 20-May-1940  Facility:  Nursing Home Location:  [facility] Room Number: 301-B  LEVEL OF CARE:  SNF (31)  Contact Information    Name [place] Daughter  [phone]       Code Status History    Date Active Date Inactive Code Status Order ID Comments User Context   10/03/2016 09:44 10/07/2016 18:51 Full Code [id number]  Waldemar Dickens, MD ED   01/23/2016 08:34 01/29/2016 00:41 Full Code [id number]  Brenton Grills, PA-C ED   01/12/2016 01:28 01/15/2016 19:21 Full Code [id number]  Edwin Dada, MD Inpatient   12/01/2015 00:37 12/04/2015 18:44 Full Code [id number]  [name], MD Inpatient   11/10/2015 09:46 11/20/2015 22:50 Full Code [id number]  Samella Parr, NP Inpatient       Chief Complaint  Patient presents with  . Medical Management of Chronic Issues    Routine [facility] visit    HISTORY OF PRESENT ILLNESS:  This is a 20-YO female seen for a routine SNF visit.  She is a long-term care resident of [facility].  She has a PMH of COPD, anemia, hyperthyroidism, anemia, and depression. She was seen in her room today. She was noted to have a short cast on her LLE. She denies any pain.    PAST MEDICAL HISTORY:  Past Medical History:  Diagnosis Date  . COPD (chronic obstructive pulmonary disease) ([place])   . History of anxiety   . History of depression   . Spinal stenosis   . UTI (urinary tract infection)      CURRENT MEDICATIONS: Reviewed    Medication List        Accurate as of 11/29/16  2:58 PM. Always use your most recent med list.          aspirin 81 MG chewable tablet   bisacodyl 10 MG suppository Commonly known as:  DULCOLAX   CLEAR EYES REDNESS RELIEF 0.012-0.2 % Soln Generic drug:  naphazoline-glycerin   diclofenac sodium 1 % Gel Commonly known as:  VOLTAREN   * digoxin 0.125  MG tablet Commonly known as:  LANOXIN   * digoxin 0.125 MG tablet Commonly known as:  LANOXIN   docusate sodium 100 MG capsule Commonly known as:  COLACE Take 1 capsule (100 mg total) by mouth 2 (two) times daily as needed for mild constipation.   ferrous sulfate 325 (65 FE) MG tablet   flecainide 50 MG tablet Commonly known as:  TAMBOCOR   gabapentin 300 MG capsule Commonly known as:  NEURONTIN Take 1 capsule (300 mg total) by mouth 3 (three) times daily.   HYDROcodone-acetaminophen 5-325 MG tablet Commonly known as:  NORCO/VICODIN Take 1 tablet every 12 (twelve) hours as needed by mouth for severe pain.   INCRUSE ELLIPTA 62.5 MCG/INH Aepb Generic drug:  umeclidinium bromide   * levalbuterol 1.25 MG/3ML nebulizer solution Commonly known as:  XOPENEX   * levalbuterol 0.63 MG/3ML nebulizer solution Commonly known as:  XOPENEX   magnesium hydroxide 400 MG/5ML suspension Commonly known as:  MILK OF MAGNESIA   metoprolol succinate 25 MG 24 hr tablet Commonly known as:  TOPROL-XL   mirtazapine 15 MG tablet Commonly known as:  REMERON   MYLANTA PO   NUTRITIONAL SUPPLEMENT Liqd   ondansetron 4 MG tablet Commonly known as:  ZOFRAN   pantoprazole 40 MG tablet Commonly known as:  PROTONIX   RA SALINE ENEMA 19-7 GM/118ML Enem   sertraline 50 MG tablet Commonly known as:  ZOLOFT      * This list has 4 medication(s) that are the same as other medications prescribed for you. Read the directions carefully, and ask your doctor or other care provider to review them with you.           No Known Allergies   REVIEW OF SYSTEMS:  GENERAL: no change in appetite, no fatigue, no weight changes, no fever, chills or weakness MOUTH and THROAT: Denies oral discomfort RESPIRATORY: no cough, SOB, DOE, wheezing, hemoptysis CARDIAC: no chest pain, edema or palpitations GI: no abdominal pain, diarrhea, constipation, heart burn, nausea or vomiting GU: Denies dysuria, frequency,  hematuria, incontinence, or discharge PSYCHIATRIC: Denies feeling of depression or anxiety. No report of hallucinations, insomnia, paranoia, or agitation    PHYSICAL EXAMINATION   GENERAL APPEARANCE: Well nourished. In no acute distress. Normal body habitus SKIN:  Skin is warm and dry.  MOUTH and THROAT: Lips are without lesions. Oral mucosa is moist and without lesions.  RESPIRATORY: breathing is even & unlabored, BS CTAB CARDIAC: RRR, no murmur,no extra heart sounds, no edema GI: abdomen soft, normal BS, no masses, no tenderness, no hepatomegaly, no splenomegaly EXTREMITIES:  Able to move X 4 extremities, has short cast on left leg PSYCHIATRIC: Alert and oriented X 3. Affect and behavior are appropriate    LABS/RADIOLOGY: Labs reviewed: Basic Metabolic Panel: Recent Labs    01/23/16 0846 01/24/16 0343  10/06/16 0321 10/06/16 1943 10/07/16 0347 11/04/16 11/24/16  NA  --  140   < > 140 141 140 143 140  K  --  3.6   < > 4.2 4.0 3.5 4.3 4.7  CL  --  101   < > 102 102 103  --   --   CO2  --  29   < > 32 31 31  --   --   GLUCOSE  --  86   < > 90 104* 96  --   --   BUN  --  11   < > 23* 23* 20 22* 15  CREATININE  --  0.91   < > 1.54* 1.36* 1.21* 1.2* 1.3*  CALCIUM  --  7.8*   < > 8.4* 8.0* 8.1*  --   --   MG 1.8 2.0  --  2.0  --  1.9  --   --   PHOS 3.4  --   --  3.0  --  2.6  --   --    < > = values in this interval not displayed.   Liver Function Tests: Recent Labs    01/23/16 0846 05/12/16 10/06/16 0321 10/07/16 0347  AST 12* 13 22 15   ALT 10* 8 16 12*  ALKPHOS 94 80 88 88  BILITOT 0.4  --  0.7 0.5  PROT 4.7*  --  6.4* 6.0*  ALBUMIN 1.7*  --  2.7* 2.8*    Recent Labs    12/01/15 0047  AMMONIA 11   CBC: Recent Labs    10/05/16 0342 10/06/16 0321 10/07/16 0347 11/04/16  WBC 7.3 5.1 5.4 5.1  NEUTROABS  --  2.9 3.0 2  HGB 9.7* 8.8* 9.0* 10.2*  HCT 32.2* 29.0* 29.0* 31*  MCV 100.0 100.3* 99.7  --   PLT 158 142* 160 161   Lipid Panel: Recent Labs     12/01/15 0644  HDL 27*   Cardiac  Enzymes: Recent Labs    01/13/16 0718 01/23/16 0846 01/23/16 1425  TROPONINI 0.06* <0.03 <0.03   CBG: Recent Labs    11/30/15 1911 01/25/16 1601  GLUCAP 79 107*      ASSESSMENT/PLAN:  1. Bimalleolar ankle fracture, left, sequela - follows-up with orthopedics, currently has LLE short cast, denies pain, continue Norco 5-325 mg 1 tab every 12 hours when necessary   2. Anemia, unspecified type - stable, continue ferrous sulfate 325 mg daily Lab Results  Component Value Date   HGB 10.2 (A) 11/04/2016    3. Chronic obstructive pulmonary disease, unspecified COPD type ([place]) - no wheezing, continue [place] Ellipta daily, levalbuterol neb every 6 hours when necessary and levalbuterol neb twice a day   4.  Depression, recurrent ([place]) -mood is stable, mirtazapine 15 mg 1 tab daily and sertraline 50 mg 1 tab daily   5. SVT (supraventricular tachycardia) ([facility]) - continue digoxin 125 g 1 tab daily, flecainide acetate 50 mg 1 tab every 12 hours and aspirin 81 mg daily   6. Neuropathy - continue gabapentin 300 mg 1 capsule 3 times daily    Goals of care:  Long-term care     Monina C. [place] - NP    [employer] [phone]

## 2016-12-07 DIAGNOSIS — S82842D Displaced bimalleolar fracture of left lower leg, subsequent encounter for closed fracture with routine healing: Secondary | ICD-10-CM | POA: Diagnosis not present

## 2016-12-08 DIAGNOSIS — E0590 Thyrotoxicosis, unspecified without thyrotoxic crisis or storm: Secondary | ICD-10-CM | POA: Diagnosis not present

## 2016-12-08 DIAGNOSIS — I4891 Unspecified atrial fibrillation: Secondary | ICD-10-CM | POA: Diagnosis not present

## 2016-12-08 DIAGNOSIS — E039 Hypothyroidism, unspecified: Secondary | ICD-10-CM | POA: Diagnosis not present

## 2016-12-08 DIAGNOSIS — Z5181 Encounter for therapeutic drug level monitoring: Secondary | ICD-10-CM | POA: Diagnosis not present

## 2016-12-08 LAB — TSH: TSH: 2.51 (ref 0.41–5.90)

## 2016-12-09 DIAGNOSIS — M6281 Muscle weakness (generalized): Secondary | ICD-10-CM | POA: Diagnosis not present

## 2016-12-09 DIAGNOSIS — R262 Difficulty in walking, not elsewhere classified: Secondary | ICD-10-CM | POA: Diagnosis not present

## 2016-12-09 DIAGNOSIS — S82842D Displaced bimalleolar fracture of left lower leg, subsequent encounter for closed fracture with routine healing: Secondary | ICD-10-CM | POA: Diagnosis not present

## 2016-12-10 DIAGNOSIS — S82842D Displaced bimalleolar fracture of left lower leg, subsequent encounter for closed fracture with routine healing: Secondary | ICD-10-CM | POA: Diagnosis not present

## 2016-12-10 DIAGNOSIS — R262 Difficulty in walking, not elsewhere classified: Secondary | ICD-10-CM | POA: Diagnosis not present

## 2016-12-10 DIAGNOSIS — M6281 Muscle weakness (generalized): Secondary | ICD-10-CM | POA: Diagnosis not present

## 2016-12-11 DIAGNOSIS — S82842D Displaced bimalleolar fracture of left lower leg, subsequent encounter for closed fracture with routine healing: Secondary | ICD-10-CM | POA: Diagnosis not present

## 2016-12-11 DIAGNOSIS — M6281 Muscle weakness (generalized): Secondary | ICD-10-CM | POA: Diagnosis not present

## 2016-12-11 DIAGNOSIS — R262 Difficulty in walking, not elsewhere classified: Secondary | ICD-10-CM | POA: Diagnosis not present

## 2016-12-12 DIAGNOSIS — R262 Difficulty in walking, not elsewhere classified: Secondary | ICD-10-CM | POA: Diagnosis not present

## 2016-12-12 DIAGNOSIS — S82842D Displaced bimalleolar fracture of left lower leg, subsequent encounter for closed fracture with routine healing: Secondary | ICD-10-CM | POA: Diagnosis not present

## 2016-12-12 DIAGNOSIS — M6281 Muscle weakness (generalized): Secondary | ICD-10-CM | POA: Diagnosis not present

## 2016-12-13 DIAGNOSIS — R262 Difficulty in walking, not elsewhere classified: Secondary | ICD-10-CM | POA: Diagnosis not present

## 2016-12-13 DIAGNOSIS — S82842D Displaced bimalleolar fracture of left lower leg, subsequent encounter for closed fracture with routine healing: Secondary | ICD-10-CM | POA: Diagnosis not present

## 2016-12-13 DIAGNOSIS — M6281 Muscle weakness (generalized): Secondary | ICD-10-CM | POA: Diagnosis not present

## 2016-12-14 DIAGNOSIS — R262 Difficulty in walking, not elsewhere classified: Secondary | ICD-10-CM | POA: Diagnosis not present

## 2016-12-14 DIAGNOSIS — S82842D Displaced bimalleolar fracture of left lower leg, subsequent encounter for closed fracture with routine healing: Secondary | ICD-10-CM | POA: Diagnosis not present

## 2016-12-14 DIAGNOSIS — M6281 Muscle weakness (generalized): Secondary | ICD-10-CM | POA: Diagnosis not present

## 2016-12-15 DIAGNOSIS — R262 Difficulty in walking, not elsewhere classified: Secondary | ICD-10-CM | POA: Diagnosis not present

## 2016-12-15 DIAGNOSIS — S82842D Displaced bimalleolar fracture of left lower leg, subsequent encounter for closed fracture with routine healing: Secondary | ICD-10-CM | POA: Diagnosis not present

## 2016-12-15 DIAGNOSIS — M6281 Muscle weakness (generalized): Secondary | ICD-10-CM | POA: Diagnosis not present

## 2016-12-16 DIAGNOSIS — M6281 Muscle weakness (generalized): Secondary | ICD-10-CM | POA: Diagnosis not present

## 2016-12-16 DIAGNOSIS — S82842D Displaced bimalleolar fracture of left lower leg, subsequent encounter for closed fracture with routine healing: Secondary | ICD-10-CM | POA: Diagnosis not present

## 2016-12-16 DIAGNOSIS — R262 Difficulty in walking, not elsewhere classified: Secondary | ICD-10-CM | POA: Diagnosis not present

## 2016-12-17 DIAGNOSIS — S82842D Displaced bimalleolar fracture of left lower leg, subsequent encounter for closed fracture with routine healing: Secondary | ICD-10-CM | POA: Diagnosis not present

## 2016-12-17 DIAGNOSIS — M6281 Muscle weakness (generalized): Secondary | ICD-10-CM | POA: Diagnosis not present

## 2016-12-17 DIAGNOSIS — R262 Difficulty in walking, not elsewhere classified: Secondary | ICD-10-CM | POA: Diagnosis not present

## 2016-12-19 DIAGNOSIS — M6281 Muscle weakness (generalized): Secondary | ICD-10-CM | POA: Diagnosis not present

## 2016-12-19 DIAGNOSIS — S82842D Displaced bimalleolar fracture of left lower leg, subsequent encounter for closed fracture with routine healing: Secondary | ICD-10-CM | POA: Diagnosis not present

## 2016-12-19 DIAGNOSIS — R262 Difficulty in walking, not elsewhere classified: Secondary | ICD-10-CM | POA: Diagnosis not present

## 2016-12-20 DIAGNOSIS — M6281 Muscle weakness (generalized): Secondary | ICD-10-CM | POA: Diagnosis not present

## 2016-12-20 DIAGNOSIS — R262 Difficulty in walking, not elsewhere classified: Secondary | ICD-10-CM | POA: Diagnosis not present

## 2016-12-20 DIAGNOSIS — S82842D Displaced bimalleolar fracture of left lower leg, subsequent encounter for closed fracture with routine healing: Secondary | ICD-10-CM | POA: Diagnosis not present

## 2016-12-21 DIAGNOSIS — R262 Difficulty in walking, not elsewhere classified: Secondary | ICD-10-CM | POA: Diagnosis not present

## 2016-12-21 DIAGNOSIS — S82842D Displaced bimalleolar fracture of left lower leg, subsequent encounter for closed fracture with routine healing: Secondary | ICD-10-CM | POA: Diagnosis not present

## 2016-12-21 DIAGNOSIS — M6281 Muscle weakness (generalized): Secondary | ICD-10-CM | POA: Diagnosis not present

## 2016-12-22 ENCOUNTER — Non-Acute Institutional Stay (SKILLED_NURSING_FACILITY): Payer: Medicare Other | Admitting: Adult Health

## 2016-12-22 ENCOUNTER — Encounter: Payer: Self-pay | Admitting: Adult Health

## 2016-12-22 DIAGNOSIS — R251 Tremor, unspecified: Secondary | ICD-10-CM | POA: Diagnosis not present

## 2016-12-22 DIAGNOSIS — G629 Polyneuropathy, unspecified: Secondary | ICD-10-CM

## 2016-12-22 DIAGNOSIS — D649 Anemia, unspecified: Secondary | ICD-10-CM | POA: Diagnosis not present

## 2016-12-22 DIAGNOSIS — J449 Chronic obstructive pulmonary disease, unspecified: Secondary | ICD-10-CM | POA: Diagnosis not present

## 2016-12-22 DIAGNOSIS — F339 Major depressive disorder, recurrent, unspecified: Secondary | ICD-10-CM | POA: Diagnosis not present

## 2016-12-22 DIAGNOSIS — I471 Supraventricular tachycardia: Secondary | ICD-10-CM | POA: Diagnosis not present

## 2016-12-22 DIAGNOSIS — S82842D Displaced bimalleolar fracture of left lower leg, subsequent encounter for closed fracture with routine healing: Secondary | ICD-10-CM | POA: Diagnosis not present

## 2016-12-22 DIAGNOSIS — S82842S Displaced bimalleolar fracture of left lower leg, sequela: Secondary | ICD-10-CM | POA: Diagnosis not present

## 2016-12-22 DIAGNOSIS — M6281 Muscle weakness (generalized): Secondary | ICD-10-CM | POA: Diagnosis not present

## 2016-12-22 DIAGNOSIS — R262 Difficulty in walking, not elsewhere classified: Secondary | ICD-10-CM | POA: Diagnosis not present

## 2016-12-22 NOTE — Progress Notes (Signed)
Location:  [facility] [place] Room Number: 361-W Place of Service:  SNF (31) Provider:  Durenda Age, NP  Patient Care Team: [name], MD as PCP - General (Internal Medicine) Medina-Vargas, [name], NP as Nurse Practitioner (Internal Medicine)  Extended Emergency Contact Information Primary Emergency Contact: Hatfield,Lisa Address: [address]          [place], [place] [address] [place] Work Phone: [phone] Mobile Phone: [phone] Relation: Daughter  Code Status:  Full Code  Goals of care: Advanced Directive information Advanced Directives 11/08/2016  Does Patient Have a Medical Advance Directive? No  Type of Advance Directive -  Does patient want to make changes to medical advance directive? -  Would patient like information on creating a medical advance directive? No - Patient declined     Chief Complaint  Patient presents with  . Medical Management of Chronic Issues    Routine [facility] visit    HPI:  Pt is a 76 y.o. female seen today for medical management of chronic diseases.  She is a long-term care resident of [facility].  She has a PMH of COPD, anemia, hyperthyroidism, anemia, and depression. She was seen in the room today. She complained about her hand tremors. She said that her tremors are not continuous but it bothers her when she is eating.     Past Medical History:  Diagnosis Date  . COPD (chronic obstructive pulmonary disease) ([place])   . History of anxiety   . History of depression   . Spinal stenosis   . UTI (urinary tract infection)    Past Surgical History:  Procedure Laterality Date  . FLEXIBLE SIGMOIDOSCOPY Left 11/18/2015   Procedure: FLEXIBLE SIGMOIDOSCOPY;  Surgeon: [name], MD;  Location: [facility];  Service: Endoscopy;  Laterality: Left;  . FLEXIBLE SIGMOIDOSCOPY N/A 11/20/2015   Procedure: FLEXIBLE SIGMOIDOSCOPY;  Surgeon: [name], MD;  [facility]  ENDOSCOPY;  Service: Endoscopy;  Laterality: N/A;  . HIP ARTHROPLASTY Right 01/12/2016   Procedure: ARTHROPLASTY BIPOLAR HIP (HEMIARTHROPLASTY);  Surgeon: [name], MD;  Location: [facility];  Service: Orthopedics;  Laterality: Right;    No Known Allergies  Outpatient Encounter Medications as of 12/22/2016  Medication Sig  . aspirin 81 MG chewable tablet Chew 81 mg by mouth 2 (two) times daily.  . bisacodyl (DULCOLAX) 10 MG suppository Place 10 mg rectally as needed for moderate constipation.  . Calcium & Magnesium Carbonates (MYLANTA PO) Take 30 mLs by mouth every 2 (two) hours as needed (Indigestion, notify MD/NP if not relieved in 24 hours).  . diclofenac sodium (VOLTAREN) 1 % GEL Apply 2 g topically at bedtime. Apply to left posterior chest  . digoxin (LANOXIN) 0.125 MG tablet Take 0.125 mg by mouth daily. Hold if HR <60  . docusate sodium (COLACE) 100 MG capsule Take 1 capsule (100 mg total) by mouth 2 (two) times daily as needed for mild constipation.  . ferrous sulfate 325 (65 FE) MG tablet Take 325 mg by mouth daily with breakfast.   . flecainide (TAMBOCOR) 50 MG tablet Take 50 mg by mouth 2 (two) times daily.  Marland Kitchen gabapentin (NEURONTIN) 300 MG capsule Take 1 capsule (300 mg total) by mouth 3 (three) times daily.  Marland Kitchen HYDROcodone-acetaminophen (NORCO/VICODIN) 5-325 MG tablet Take 1 tablet every 12 (twelve) hours as needed by mouth for severe pain.  . INCRUSE ELLIPTA 62.5 MCG/INH AEPB Inhale 1 puff into the lungs daily.   Marland Kitchen levalbuterol (XOPENEX) 0.63 MG/3ML nebulizer  solution Take 0.63 mg by nebulization 2 (two) times daily.   Marland Kitchen levalbuterol (XOPENEX) 1.25 MG/3ML nebulizer solution Take 1.25 mg by nebulization every 6 (six) hours as needed for wheezing.  . magnesium hydroxide (MILK OF MAGNESIA) 400 MG/5ML suspension Take 30 mLs by mouth daily as needed for mild constipation.  . mirtazapine (REMERON) 15 MG tablet Take 15 mg by mouth at bedtime.  . naphazoline-glycerin (CLEAR EYES REDNESS  RELIEF) 0.012-0.2 % SOLN Place 1 drop into both eyes 2 (two) times daily.  Marland Kitchen NUTRITIONAL SUPPLEMENT LIQD Take 120 mLs by mouth 2 (two) times daily. MedPass  . ondansetron (ZOFRAN) 4 MG tablet Take 4 mg by mouth 2 (two) times daily as needed.   . pantoprazole (PROTONIX) 40 MG tablet Take 40 mg by mouth daily.  . promethazine (PHENERGAN) 25 MG/ML injection Inject 25 mg into the muscle every 6 (six) hours as needed for nausea or vomiting.  . sertraline (ZOLOFT) 50 MG tablet Take 50 mg by mouth daily.  . Sodium Phosphates (RA SALINE ENEMA) 19-7 GM/118ML ENEM Place 1 each rectally as needed (for constipation).  . [DISCONTINUED] digoxin (LANOXIN) 0.125 MG tablet Take 0.125 mg by mouth See admin instructions. Take Harrell Lark, Sat, Sun  . [DISCONTINUED] metoprolol succinate (TOPROL-XL) 25 MG 24 hr tablet Take 12.5 mg by mouth at bedtime.    No facility-administered encounter medications on file as of 12/22/2016.     Review of Systems  GENERAL: No change in appetite, no fatigue, no weight changes, no fever, chills or weakness MOUTH and THROAT: Denies oral discomfort, gingival pain or bleeding RESPIRATORY: no cough, SOB, DOE, wheezing, hemoptysis CARDIAC: No chest pain, edema or palpitations GI: No abdominal pain, diarrhea, constipation, heart burn, nausea or vomiting GU: Denies dysuria, frequency, hematuria, incontinence, or discharge NEUROLOGICAL: +hand tremors PSYCHIATRIC: Denies feelings of depression or anxiety. No report of hallucinations, insomnia, paranoia, or agitation   Immunization History  Administered Date(s) Administered  . Influenza, High Dose Seasonal PF 10/04/2016  . Influenza,inj,Quad PF,6+ Mos 01/13/2016  . Pneumococcal-Unspecified 10/11/2011  . Tdap 01/23/2016   Pertinent  Health Maintenance Due  Topic Date Due  . PNA vac Low Risk Adult (2 of 2 - PCV13) 01/10/2018 (Originally 10/10/2012)  . DEXA SCAN  01/11/2028 (Originally 07/06/2004)  . INFLUENZA VACCINE  Completed    Fall Risk  11/08/2016 03/01/2016 01/20/2016  Falls in the past year? Yes Yes Yes  Number falls in past yr: 2 or more 2 or more 1  Injury with Fall? Yes Yes Yes  Comment R hip, L ankle - femoral neck fracture that required surgery  Risk Factor Category  - High Fall Risk -      Vitals:   12/22/16 1246  BP: 110/64  Pulse: 72  Resp: 16  Temp: (!) 97 F (36.1 C)  TempSrc: Oral  SpO2: 94%  Weight: 146 lb (66.2 kg)  Height: 5\' 8"  (1.727 m)   Body mass index is 22.2 kg/m.  Physical Exam  GENERAL APPEARANCE: Well nourished. In no acute distress. Normal body habitus SKIN:  Skin is warm and dry.  MOUTH and THROAT: Lips are without lesions. Oral mucosa is moist and without lesions.  RESPIRATORY: Breathing is even & unlabored, BS CTAB CARDIAC: RRR, no murmur,no extra heart sounds, no edema GI: Abdomen soft, normal BS, no masses, no tenderness EXTREMITIES:  Able to move X 4 extremities, Left foot CAM boot NEURO:  +fine tremors of bilateral hands PSYCHIATRIC: Alert and oriented X 3. Affect and behavior are  appropriate   Labs reviewed: Recent Labs    01/23/16 0846 01/24/16 0343  10/06/16 0321 10/06/16 1943 10/07/16 0347 11/04/16 11/24/16  NA  --  140   < > 140 141 140 143 140  K  --  3.6   < > 4.2 4.0 3.5 4.3 4.7  CL  --  101   < > 102 102 103  --   --   CO2  --  29   < > 32 31 31  --   --   GLUCOSE  --  86   < > 90 104* 96  --   --   BUN  --  11   < > 23* 23* 20 22* 15  CREATININE  --  0.91   < > 1.54* 1.36* 1.21* 1.2* 1.3*  CALCIUM  --  7.8*   < > 8.4* 8.0* 8.1*  --   --   MG 1.8 2.0  --  2.0  --  1.9  --   --   PHOS 3.4  --   --  3.0  --  2.6  --   --    < > = values in this interval not displayed.   Recent Labs    01/23/16 0846 05/12/16 10/06/16 0321 10/07/16 0347  AST 12* 13 22 15   ALT 10* 8 16 12*  ALKPHOS 94 80 88 88  BILITOT 0.4  --  0.7 0.5  PROT 4.7*  --  6.4* 6.0*  ALBUMIN 1.7*  --  2.7* 2.8*   Recent Labs    10/05/16 0342 10/06/16 0321  10/07/16 0347 11/04/16  WBC 7.3 5.1 5.4 5.1  NEUTROABS  --  2.9 3.0 2  HGB 9.7* 8.8* 9.0* 10.2*  HCT 32.2* 29.0* 29.0* 31*  MCV 100.0 100.3* 99.7  --   PLT 158 142* 160 161   Lab Results  Component Value Date   TSH 2.51 12/08/2016   Lab Results  Component Value Date   HGBA1C 5.2 12/01/2015   Lab Results  Component Value Date   CHOL 107 12/01/2015   HDL 27 (L) 12/01/2015   LDLCALC 59 12/01/2015   TRIG 107 12/01/2015   CHOLHDL 4.0 12/01/2015    Assessment/Plan  1. Occasional tremors - will refer to neurology   2. Bimalleolar ankle fracture, left, sequela - continue CAM boot, follow-up with orthopedics, continue Norco 5-325 mg 1 tab every 12 hours when necessary   3. Chronic obstructive pulmonary disease, unspecified COPD type ([place]) - no SOB nor wheezing, continue [place] albuterol 1.25 mg/3 mL inhale via Ned every 6 hours when necessary, an routined twice a day, Incr[place] Ellipta 62.5 g inhale 1 puff daily   4. SVT (supraventricular tachycardia) ([facility]) - continue flecainide 50 mg 1 tab every 12 hours, digoxin 125 g 1 tab daily and aspirin 81 mg 1 tab twice a day   5. Depression, recurrent ([place]) - mood is stable, continue sertraline 50 mg 1 tab daily and mirtazapine 9:15 mg 1 tab daily   6. Anemia, unspecified type - continue ferrous sulfate 325 mg 1 tab daily Lab Results  Component Value Date   HGB 10.2 (A) 11/04/2016     7. Neuropathy - continue gabapentin 300 mg 1 capsule 3 times a day     Family/ staff Communication: Discussed plan of care with the resident.  Labs/tests ordered:  None  Goals of care:   Long-term care   Durenda Age, NP [facility] [phone] (  Monday-Friday 8:00 a.m. - 5:00 p.m.) [phone] (after hours)

## 2016-12-23 DIAGNOSIS — M6281 Muscle weakness (generalized): Secondary | ICD-10-CM | POA: Diagnosis not present

## 2016-12-23 DIAGNOSIS — S82842D Displaced bimalleolar fracture of left lower leg, subsequent encounter for closed fracture with routine healing: Secondary | ICD-10-CM | POA: Diagnosis not present

## 2016-12-23 DIAGNOSIS — R262 Difficulty in walking, not elsewhere classified: Secondary | ICD-10-CM | POA: Diagnosis not present

## 2016-12-24 DIAGNOSIS — S82842D Displaced bimalleolar fracture of left lower leg, subsequent encounter for closed fracture with routine healing: Secondary | ICD-10-CM | POA: Diagnosis not present

## 2016-12-24 DIAGNOSIS — M6281 Muscle weakness (generalized): Secondary | ICD-10-CM | POA: Diagnosis not present

## 2016-12-24 DIAGNOSIS — R262 Difficulty in walking, not elsewhere classified: Secondary | ICD-10-CM | POA: Diagnosis not present

## 2016-12-25 DIAGNOSIS — M6281 Muscle weakness (generalized): Secondary | ICD-10-CM | POA: Diagnosis not present

## 2016-12-25 DIAGNOSIS — R262 Difficulty in walking, not elsewhere classified: Secondary | ICD-10-CM | POA: Diagnosis not present

## 2016-12-25 DIAGNOSIS — S82842D Displaced bimalleolar fracture of left lower leg, subsequent encounter for closed fracture with routine healing: Secondary | ICD-10-CM | POA: Diagnosis not present

## 2016-12-26 DIAGNOSIS — S82842D Displaced bimalleolar fracture of left lower leg, subsequent encounter for closed fracture with routine healing: Secondary | ICD-10-CM | POA: Diagnosis not present

## 2016-12-26 DIAGNOSIS — R262 Difficulty in walking, not elsewhere classified: Secondary | ICD-10-CM | POA: Diagnosis not present

## 2016-12-26 DIAGNOSIS — M6281 Muscle weakness (generalized): Secondary | ICD-10-CM | POA: Diagnosis not present

## 2016-12-27 DIAGNOSIS — M6281 Muscle weakness (generalized): Secondary | ICD-10-CM | POA: Diagnosis not present

## 2016-12-27 DIAGNOSIS — S82842D Displaced bimalleolar fracture of left lower leg, subsequent encounter for closed fracture with routine healing: Secondary | ICD-10-CM | POA: Diagnosis not present

## 2016-12-27 DIAGNOSIS — R262 Difficulty in walking, not elsewhere classified: Secondary | ICD-10-CM | POA: Diagnosis not present

## 2016-12-28 DIAGNOSIS — M6281 Muscle weakness (generalized): Secondary | ICD-10-CM | POA: Diagnosis not present

## 2016-12-28 DIAGNOSIS — S82842D Displaced bimalleolar fracture of left lower leg, subsequent encounter for closed fracture with routine healing: Secondary | ICD-10-CM | POA: Diagnosis not present

## 2016-12-28 DIAGNOSIS — R262 Difficulty in walking, not elsewhere classified: Secondary | ICD-10-CM | POA: Diagnosis not present

## 2016-12-29 DIAGNOSIS — M6281 Muscle weakness (generalized): Secondary | ICD-10-CM | POA: Diagnosis not present

## 2016-12-29 DIAGNOSIS — R262 Difficulty in walking, not elsewhere classified: Secondary | ICD-10-CM | POA: Diagnosis not present

## 2016-12-29 DIAGNOSIS — S82842D Displaced bimalleolar fracture of left lower leg, subsequent encounter for closed fracture with routine healing: Secondary | ICD-10-CM | POA: Diagnosis not present

## 2016-12-30 DIAGNOSIS — R262 Difficulty in walking, not elsewhere classified: Secondary | ICD-10-CM | POA: Diagnosis not present

## 2016-12-30 DIAGNOSIS — S82842D Displaced bimalleolar fracture of left lower leg, subsequent encounter for closed fracture with routine healing: Secondary | ICD-10-CM | POA: Diagnosis not present

## 2016-12-30 DIAGNOSIS — M6281 Muscle weakness (generalized): Secondary | ICD-10-CM | POA: Diagnosis not present

## 2016-12-31 DIAGNOSIS — S82842D Displaced bimalleolar fracture of left lower leg, subsequent encounter for closed fracture with routine healing: Secondary | ICD-10-CM | POA: Diagnosis not present

## 2016-12-31 DIAGNOSIS — R262 Difficulty in walking, not elsewhere classified: Secondary | ICD-10-CM | POA: Diagnosis not present

## 2016-12-31 DIAGNOSIS — M6281 Muscle weakness (generalized): Secondary | ICD-10-CM | POA: Diagnosis not present

## 2017-01-01 DIAGNOSIS — S82842D Displaced bimalleolar fracture of left lower leg, subsequent encounter for closed fracture with routine healing: Secondary | ICD-10-CM | POA: Diagnosis not present

## 2017-01-01 DIAGNOSIS — M6281 Muscle weakness (generalized): Secondary | ICD-10-CM | POA: Diagnosis not present

## 2017-01-01 DIAGNOSIS — R262 Difficulty in walking, not elsewhere classified: Secondary | ICD-10-CM | POA: Diagnosis not present

## 2017-01-02 DIAGNOSIS — M6281 Muscle weakness (generalized): Secondary | ICD-10-CM | POA: Diagnosis not present

## 2017-01-02 DIAGNOSIS — R262 Difficulty in walking, not elsewhere classified: Secondary | ICD-10-CM | POA: Diagnosis not present

## 2017-01-02 DIAGNOSIS — S82842D Displaced bimalleolar fracture of left lower leg, subsequent encounter for closed fracture with routine healing: Secondary | ICD-10-CM | POA: Diagnosis not present

## 2017-01-04 DIAGNOSIS — S82842D Displaced bimalleolar fracture of left lower leg, subsequent encounter for closed fracture with routine healing: Secondary | ICD-10-CM | POA: Diagnosis not present

## 2017-01-04 DIAGNOSIS — R262 Difficulty in walking, not elsewhere classified: Secondary | ICD-10-CM | POA: Diagnosis not present

## 2017-01-04 DIAGNOSIS — M6281 Muscle weakness (generalized): Secondary | ICD-10-CM | POA: Diagnosis not present

## 2017-01-05 ENCOUNTER — Other Ambulatory Visit: Payer: Self-pay

## 2017-01-05 DIAGNOSIS — R262 Difficulty in walking, not elsewhere classified: Secondary | ICD-10-CM | POA: Diagnosis not present

## 2017-01-05 DIAGNOSIS — M6281 Muscle weakness (generalized): Secondary | ICD-10-CM | POA: Diagnosis not present

## 2017-01-05 DIAGNOSIS — S82842D Displaced bimalleolar fracture of left lower leg, subsequent encounter for closed fracture with routine healing: Secondary | ICD-10-CM | POA: Diagnosis not present

## 2017-01-05 MED ORDER — HYDROCODONE-ACETAMINOPHEN 5-325 MG PO TABS: 0.5000 | ORAL_TABLET | Freq: Two times a day (BID) | ORAL | 0 refills | Status: DC | PRN

## 2017-01-05 NOTE — Telephone Encounter (Signed)
Patient's medication was titrated down from one tablet q12h prn to one-half tablet q12h prn.  Hard script written by NP.

## 2017-01-06 DIAGNOSIS — M6281 Muscle weakness (generalized): Secondary | ICD-10-CM | POA: Diagnosis not present

## 2017-01-06 DIAGNOSIS — R262 Difficulty in walking, not elsewhere classified: Secondary | ICD-10-CM | POA: Diagnosis not present

## 2017-01-06 DIAGNOSIS — S82842D Displaced bimalleolar fracture of left lower leg, subsequent encounter for closed fracture with routine healing: Secondary | ICD-10-CM | POA: Diagnosis not present

## 2017-01-07 DIAGNOSIS — R262 Difficulty in walking, not elsewhere classified: Secondary | ICD-10-CM | POA: Diagnosis not present

## 2017-01-07 DIAGNOSIS — S82842D Displaced bimalleolar fracture of left lower leg, subsequent encounter for closed fracture with routine healing: Secondary | ICD-10-CM | POA: Diagnosis not present

## 2017-01-07 DIAGNOSIS — M6281 Muscle weakness (generalized): Secondary | ICD-10-CM | POA: Diagnosis not present

## 2017-01-09 DIAGNOSIS — M6281 Muscle weakness (generalized): Secondary | ICD-10-CM | POA: Diagnosis not present

## 2017-01-09 DIAGNOSIS — S82842D Displaced bimalleolar fracture of left lower leg, subsequent encounter for closed fracture with routine healing: Secondary | ICD-10-CM | POA: Diagnosis not present

## 2017-01-09 DIAGNOSIS — R262 Difficulty in walking, not elsewhere classified: Secondary | ICD-10-CM | POA: Diagnosis not present

## 2017-01-10 DIAGNOSIS — M6281 Muscle weakness (generalized): Secondary | ICD-10-CM | POA: Diagnosis not present

## 2017-01-10 DIAGNOSIS — R262 Difficulty in walking, not elsewhere classified: Secondary | ICD-10-CM | POA: Diagnosis not present

## 2017-01-10 DIAGNOSIS — S82842D Displaced bimalleolar fracture of left lower leg, subsequent encounter for closed fracture with routine healing: Secondary | ICD-10-CM | POA: Diagnosis not present

## 2017-01-11 DIAGNOSIS — S82842D Displaced bimalleolar fracture of left lower leg, subsequent encounter for closed fracture with routine healing: Secondary | ICD-10-CM | POA: Diagnosis not present

## 2017-01-11 DIAGNOSIS — M6281 Muscle weakness (generalized): Secondary | ICD-10-CM | POA: Diagnosis not present

## 2017-01-11 DIAGNOSIS — R262 Difficulty in walking, not elsewhere classified: Secondary | ICD-10-CM | POA: Diagnosis not present

## 2017-01-12 DIAGNOSIS — S82842D Displaced bimalleolar fracture of left lower leg, subsequent encounter for closed fracture with routine healing: Secondary | ICD-10-CM | POA: Diagnosis not present

## 2017-01-12 DIAGNOSIS — M6281 Muscle weakness (generalized): Secondary | ICD-10-CM | POA: Diagnosis not present

## 2017-01-12 DIAGNOSIS — R262 Difficulty in walking, not elsewhere classified: Secondary | ICD-10-CM | POA: Diagnosis not present

## 2017-01-13 DIAGNOSIS — R262 Difficulty in walking, not elsewhere classified: Secondary | ICD-10-CM | POA: Diagnosis not present

## 2017-01-13 DIAGNOSIS — M6281 Muscle weakness (generalized): Secondary | ICD-10-CM | POA: Diagnosis not present

## 2017-01-13 DIAGNOSIS — S82842D Displaced bimalleolar fracture of left lower leg, subsequent encounter for closed fracture with routine healing: Secondary | ICD-10-CM | POA: Diagnosis not present

## 2017-01-14 DIAGNOSIS — M6281 Muscle weakness (generalized): Secondary | ICD-10-CM | POA: Diagnosis not present

## 2017-01-14 DIAGNOSIS — R262 Difficulty in walking, not elsewhere classified: Secondary | ICD-10-CM | POA: Diagnosis not present

## 2017-01-14 DIAGNOSIS — S82842D Displaced bimalleolar fracture of left lower leg, subsequent encounter for closed fracture with routine healing: Secondary | ICD-10-CM | POA: Diagnosis not present

## 2017-01-16 DIAGNOSIS — R262 Difficulty in walking, not elsewhere classified: Secondary | ICD-10-CM | POA: Diagnosis not present

## 2017-01-16 DIAGNOSIS — M6281 Muscle weakness (generalized): Secondary | ICD-10-CM | POA: Diagnosis not present

## 2017-01-16 DIAGNOSIS — S82842D Displaced bimalleolar fracture of left lower leg, subsequent encounter for closed fracture with routine healing: Secondary | ICD-10-CM | POA: Diagnosis not present

## 2017-01-17 DIAGNOSIS — S82842D Displaced bimalleolar fracture of left lower leg, subsequent encounter for closed fracture with routine healing: Secondary | ICD-10-CM | POA: Diagnosis not present

## 2017-01-17 DIAGNOSIS — M6281 Muscle weakness (generalized): Secondary | ICD-10-CM | POA: Diagnosis not present

## 2017-01-17 DIAGNOSIS — R262 Difficulty in walking, not elsewhere classified: Secondary | ICD-10-CM | POA: Diagnosis not present

## 2017-01-18 DIAGNOSIS — S82842D Displaced bimalleolar fracture of left lower leg, subsequent encounter for closed fracture with routine healing: Secondary | ICD-10-CM | POA: Diagnosis not present

## 2017-01-18 DIAGNOSIS — M6281 Muscle weakness (generalized): Secondary | ICD-10-CM | POA: Diagnosis not present

## 2017-01-18 DIAGNOSIS — R262 Difficulty in walking, not elsewhere classified: Secondary | ICD-10-CM | POA: Diagnosis not present

## 2017-01-19 DIAGNOSIS — M6281 Muscle weakness (generalized): Secondary | ICD-10-CM | POA: Diagnosis not present

## 2017-01-19 DIAGNOSIS — S82842D Displaced bimalleolar fracture of left lower leg, subsequent encounter for closed fracture with routine healing: Secondary | ICD-10-CM | POA: Diagnosis not present

## 2017-01-19 DIAGNOSIS — R262 Difficulty in walking, not elsewhere classified: Secondary | ICD-10-CM | POA: Diagnosis not present

## 2017-01-20 DIAGNOSIS — R262 Difficulty in walking, not elsewhere classified: Secondary | ICD-10-CM | POA: Diagnosis not present

## 2017-01-20 DIAGNOSIS — S82842D Displaced bimalleolar fracture of left lower leg, subsequent encounter for closed fracture with routine healing: Secondary | ICD-10-CM | POA: Diagnosis not present

## 2017-01-20 DIAGNOSIS — M6281 Muscle weakness (generalized): Secondary | ICD-10-CM | POA: Diagnosis not present

## 2017-01-21 DIAGNOSIS — M6281 Muscle weakness (generalized): Secondary | ICD-10-CM | POA: Diagnosis not present

## 2017-01-21 DIAGNOSIS — S82842D Displaced bimalleolar fracture of left lower leg, subsequent encounter for closed fracture with routine healing: Secondary | ICD-10-CM | POA: Diagnosis not present

## 2017-01-21 DIAGNOSIS — R262 Difficulty in walking, not elsewhere classified: Secondary | ICD-10-CM | POA: Diagnosis not present

## 2017-01-23 DIAGNOSIS — M6281 Muscle weakness (generalized): Secondary | ICD-10-CM | POA: Diagnosis not present

## 2017-01-23 DIAGNOSIS — S82842D Displaced bimalleolar fracture of left lower leg, subsequent encounter for closed fracture with routine healing: Secondary | ICD-10-CM | POA: Diagnosis not present

## 2017-01-23 DIAGNOSIS — R262 Difficulty in walking, not elsewhere classified: Secondary | ICD-10-CM | POA: Diagnosis not present

## 2017-01-24 DIAGNOSIS — M6281 Muscle weakness (generalized): Secondary | ICD-10-CM | POA: Diagnosis not present

## 2017-01-24 DIAGNOSIS — R262 Difficulty in walking, not elsewhere classified: Secondary | ICD-10-CM | POA: Diagnosis not present

## 2017-01-24 DIAGNOSIS — S82842D Displaced bimalleolar fracture of left lower leg, subsequent encounter for closed fracture with routine healing: Secondary | ICD-10-CM | POA: Diagnosis not present

## 2017-01-25 DIAGNOSIS — R262 Difficulty in walking, not elsewhere classified: Secondary | ICD-10-CM | POA: Diagnosis not present

## 2017-01-25 DIAGNOSIS — M25572 Pain in left ankle and joints of left foot: Secondary | ICD-10-CM | POA: Diagnosis not present

## 2017-01-25 DIAGNOSIS — S82842D Displaced bimalleolar fracture of left lower leg, subsequent encounter for closed fracture with routine healing: Secondary | ICD-10-CM | POA: Diagnosis not present

## 2017-01-25 DIAGNOSIS — M6281 Muscle weakness (generalized): Secondary | ICD-10-CM | POA: Diagnosis not present

## 2017-01-26 DIAGNOSIS — S82842D Displaced bimalleolar fracture of left lower leg, subsequent encounter for closed fracture with routine healing: Secondary | ICD-10-CM | POA: Diagnosis not present

## 2017-01-26 DIAGNOSIS — R262 Difficulty in walking, not elsewhere classified: Secondary | ICD-10-CM | POA: Diagnosis not present

## 2017-01-26 DIAGNOSIS — M6281 Muscle weakness (generalized): Secondary | ICD-10-CM | POA: Diagnosis not present

## 2017-01-27 DIAGNOSIS — S82842D Displaced bimalleolar fracture of left lower leg, subsequent encounter for closed fracture with routine healing: Secondary | ICD-10-CM | POA: Diagnosis not present

## 2017-01-27 DIAGNOSIS — M6281 Muscle weakness (generalized): Secondary | ICD-10-CM | POA: Diagnosis not present

## 2017-01-27 DIAGNOSIS — R262 Difficulty in walking, not elsewhere classified: Secondary | ICD-10-CM | POA: Diagnosis not present

## 2017-01-28 DIAGNOSIS — S82842D Displaced bimalleolar fracture of left lower leg, subsequent encounter for closed fracture with routine healing: Secondary | ICD-10-CM | POA: Diagnosis not present

## 2017-01-28 DIAGNOSIS — M6281 Muscle weakness (generalized): Secondary | ICD-10-CM | POA: Diagnosis not present

## 2017-01-28 DIAGNOSIS — R262 Difficulty in walking, not elsewhere classified: Secondary | ICD-10-CM | POA: Diagnosis not present

## 2017-01-28 IMAGING — CR DG CHEST 1V
1 series · 1 of 1 positions shown · non-contrast
Comparison: 12/03/2015

CLINICAL DATA: Right hip pain status post fall

EXAM:
CHEST 1 VIEW

[x chest ap]
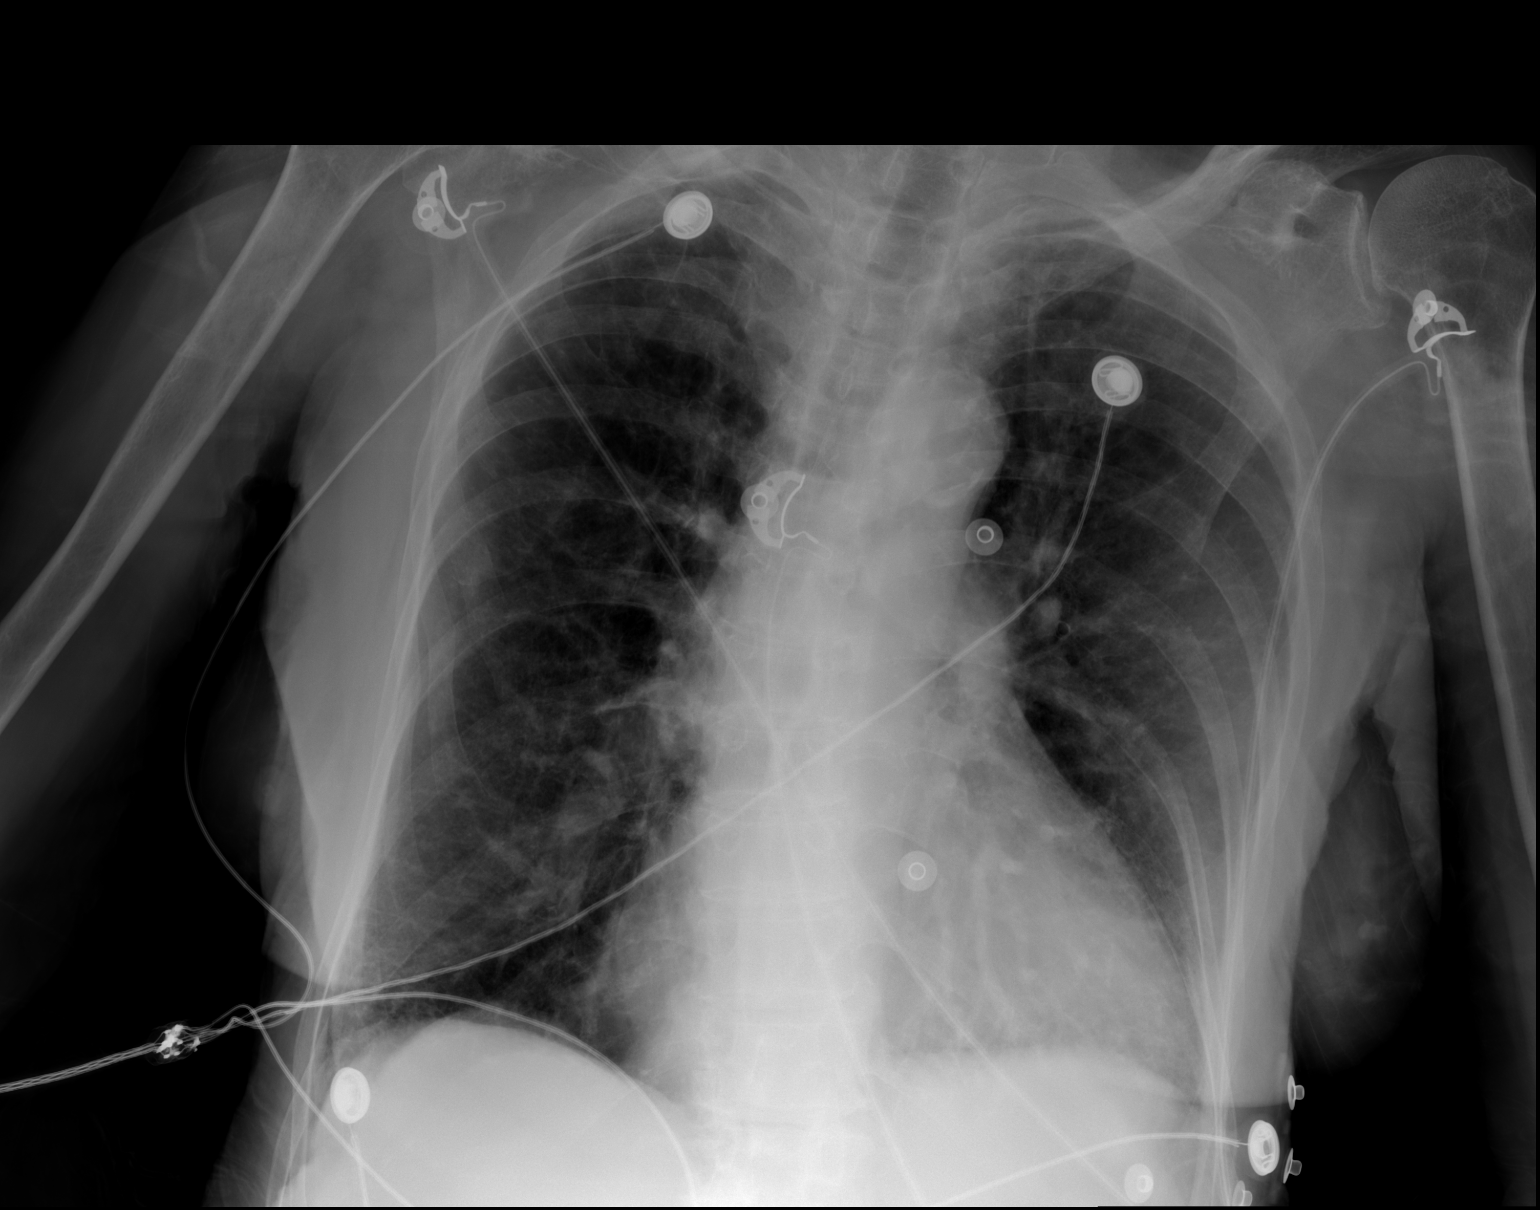

[1 of 1 positions shown; findings below may reference images not displayed]

FINDINGS: Lungs are hyperinflated with emphysematous changes. Probable mild
fibrosis within the bilateral lung bases. No acute consolidation or
effusion. Stable cardiomegaly. Atherosclerosis. No pneumothorax. Old
right clavicle fracture. Multiple old right rib fractures.
IMPRESSION: 1. Hyperinflation with emphysematous changes.  No acute infiltrate.
2. Stable cardiomegaly.

## 2017-01-29 DIAGNOSIS — S82842D Displaced bimalleolar fracture of left lower leg, subsequent encounter for closed fracture with routine healing: Secondary | ICD-10-CM | POA: Diagnosis not present

## 2017-01-29 DIAGNOSIS — R262 Difficulty in walking, not elsewhere classified: Secondary | ICD-10-CM | POA: Diagnosis not present

## 2017-01-29 DIAGNOSIS — M6281 Muscle weakness (generalized): Secondary | ICD-10-CM | POA: Diagnosis not present

## 2017-01-29 IMAGING — DX DG CHEST 1V PORT
2 series · 2 of 2 positions shown · non-contrast
Comparison: Prior radiograph from 01/11/2016.

CLINICAL DATA: Initial evaluation for acute hypoxia. History of
right hip surgery today. History of COPD.

EXAM:
PORTABLE CHEST 1 VIEW

[chest ap (1 of 2)]
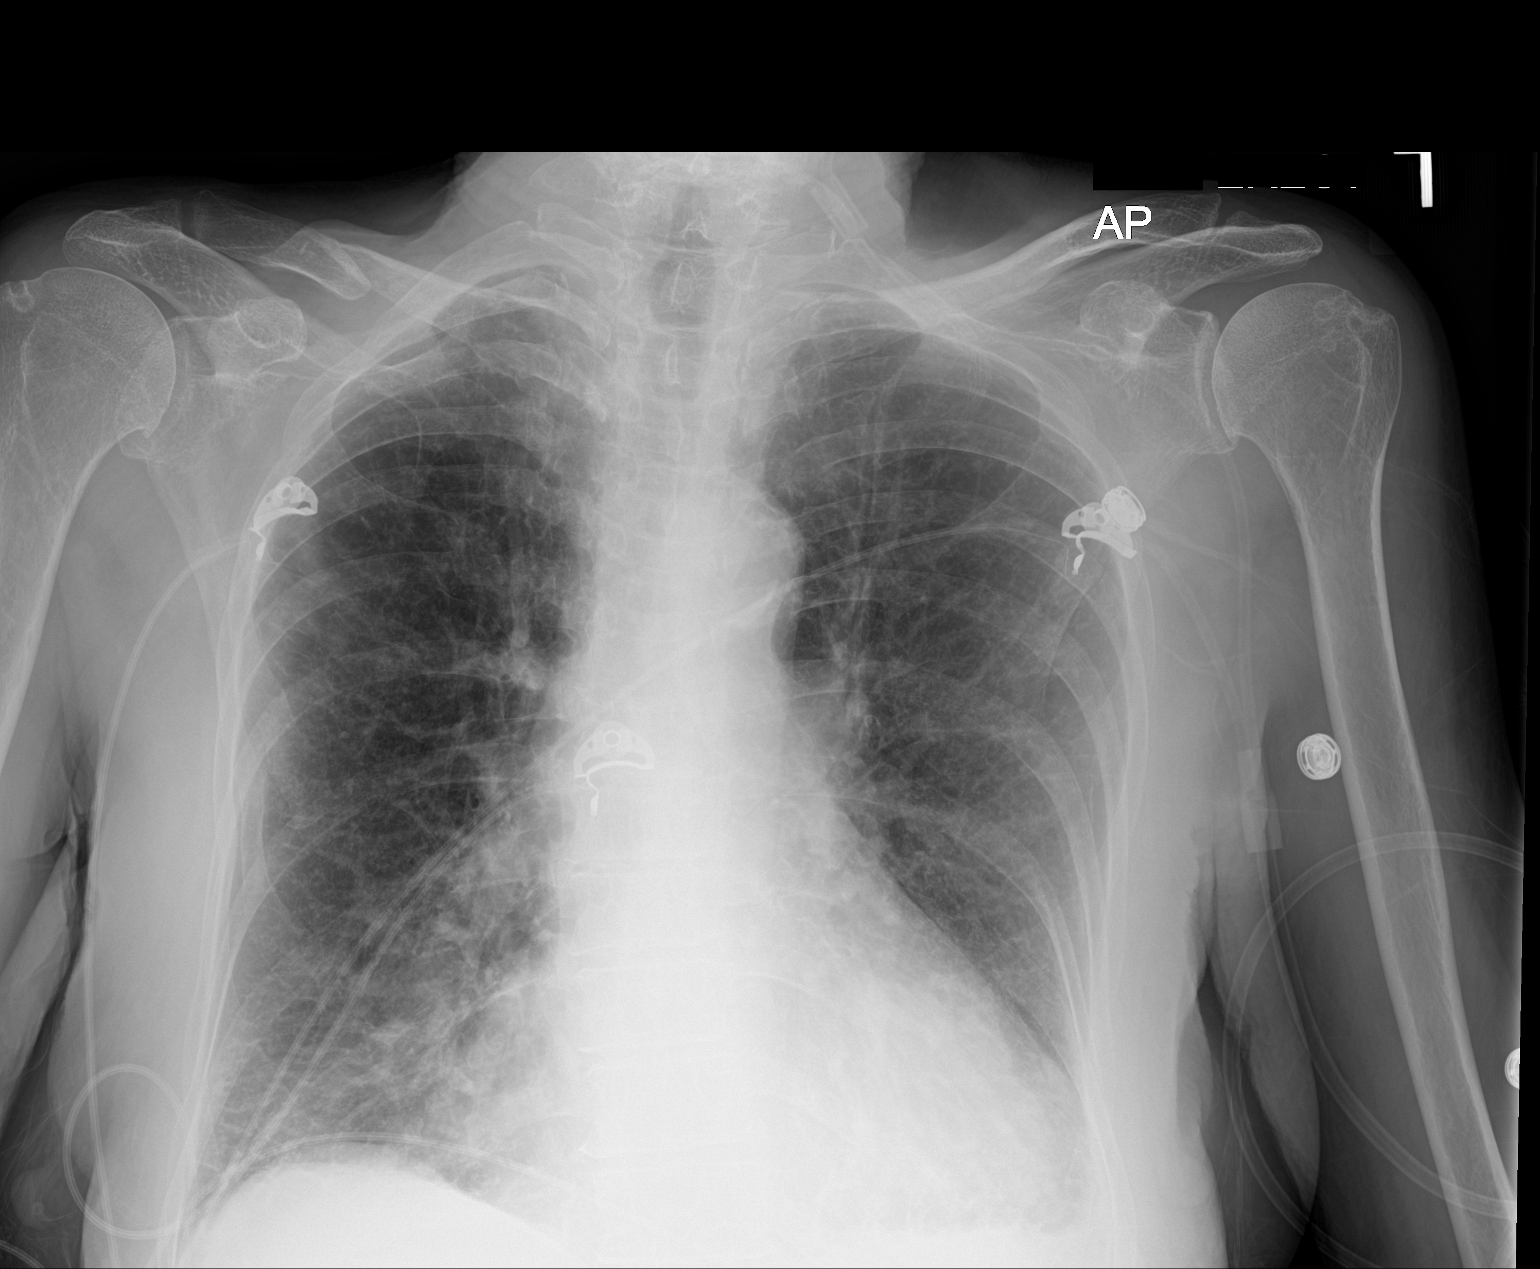

[chest ap (2 of 2)]
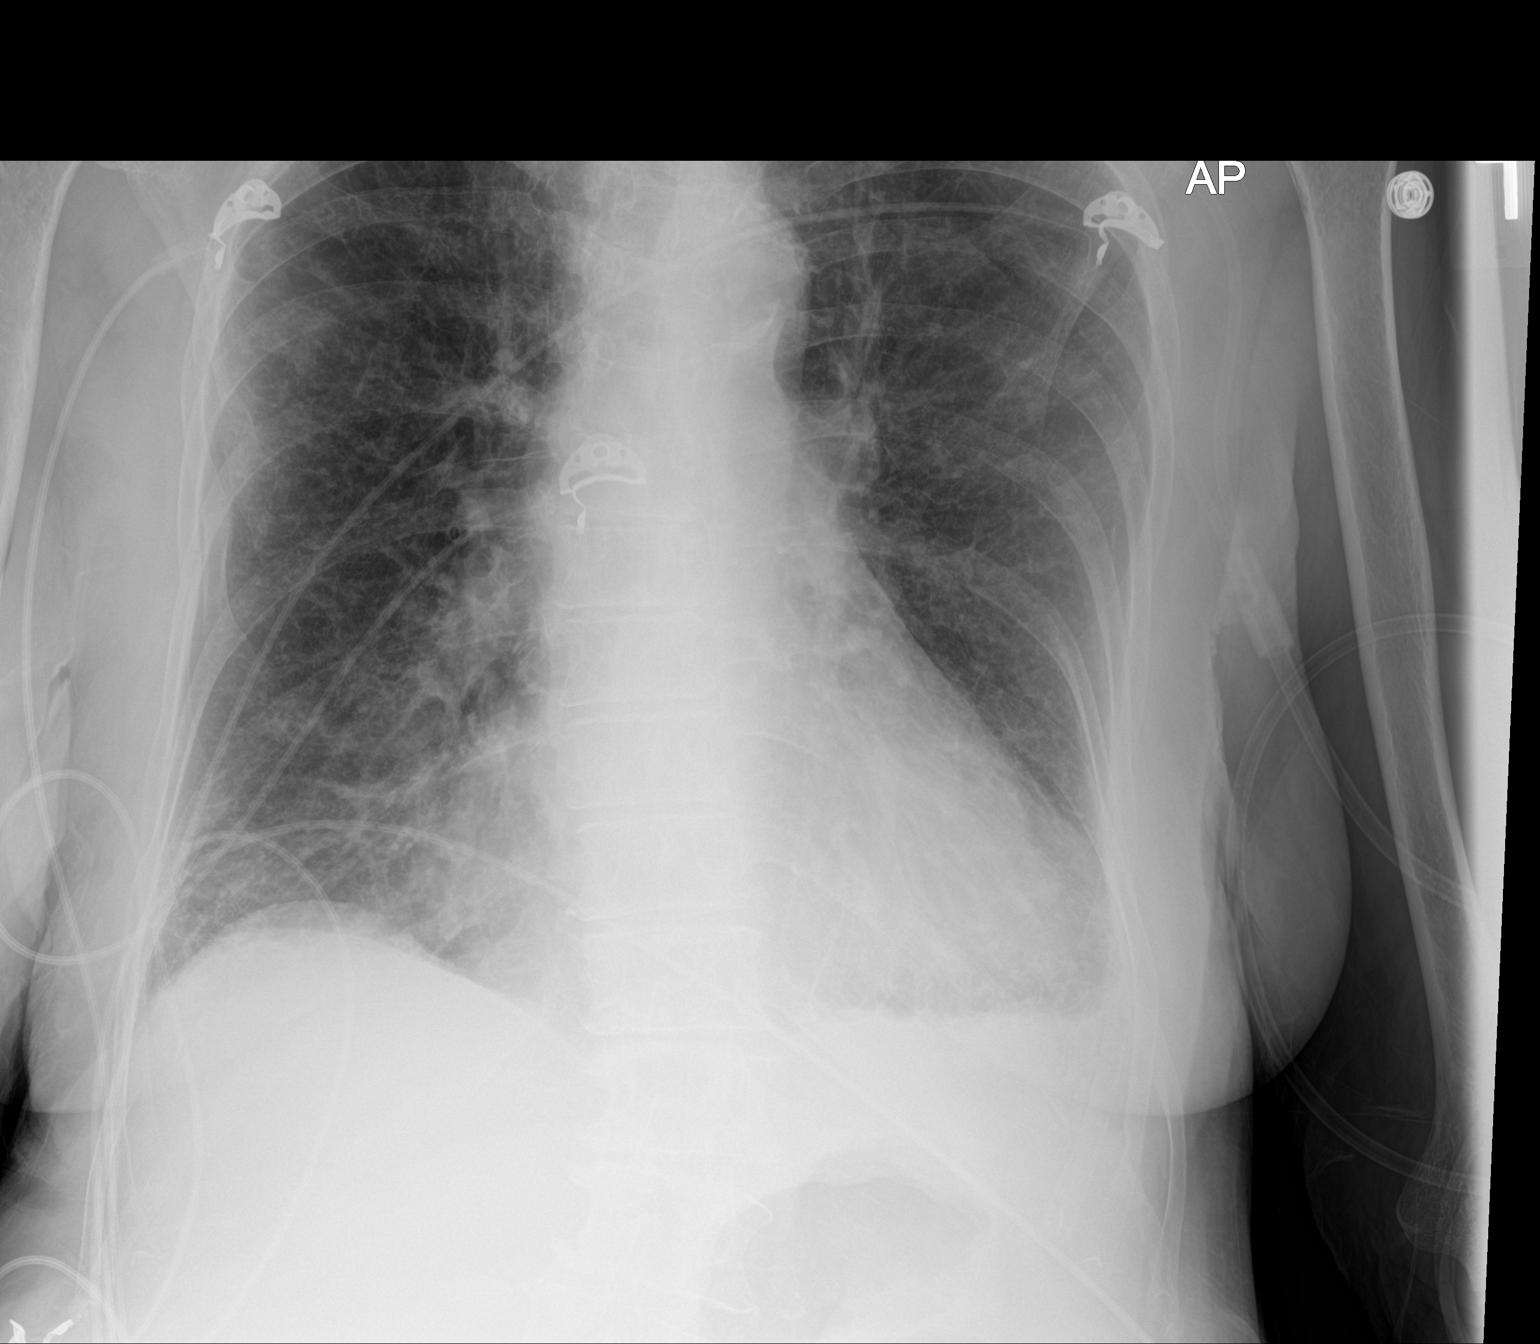

[2 of 2 positions shown; findings below may reference images not displayed]

FINDINGS: Cardiomegaly is stable from prior. Mediastinal silhouette within
normal limits. Aortic atherosclerosis noted.

Lungs are hyperinflated with underlying changes compatible with
COPD. There is diffusely increased interstitial prominence with a
few scattered Kerley B-lines, suggesting pulmonary interstitial
edema. A small left pleural effusion is present. No focal
infiltrates identified. No pneumothorax.

No acute osseous abnormality.  Osteopenia noted.
IMPRESSION: 1. Stable cardiomegaly with findings suggestive of mild diffuse
pulmonary interstitial edema.
2. Small left pleural effusion.
3. Underlying COPD.
4. Aortic atherosclerosis.

## 2017-01-30 DIAGNOSIS — S82842D Displaced bimalleolar fracture of left lower leg, subsequent encounter for closed fracture with routine healing: Secondary | ICD-10-CM | POA: Diagnosis not present

## 2017-01-30 DIAGNOSIS — M6281 Muscle weakness (generalized): Secondary | ICD-10-CM | POA: Diagnosis not present

## 2017-01-30 DIAGNOSIS — R262 Difficulty in walking, not elsewhere classified: Secondary | ICD-10-CM | POA: Diagnosis not present

## 2017-01-31 ENCOUNTER — Encounter: Payer: Self-pay | Admitting: Adult Health

## 2017-01-31 ENCOUNTER — Non-Acute Institutional Stay (SKILLED_NURSING_FACILITY): Payer: Medicare Other | Admitting: Adult Health

## 2017-01-31 DIAGNOSIS — D649 Anemia, unspecified: Secondary | ICD-10-CM | POA: Diagnosis not present

## 2017-01-31 DIAGNOSIS — F339 Major depressive disorder, recurrent, unspecified: Secondary | ICD-10-CM

## 2017-01-31 DIAGNOSIS — S82842D Displaced bimalleolar fracture of left lower leg, subsequent encounter for closed fracture with routine healing: Secondary | ICD-10-CM | POA: Diagnosis not present

## 2017-01-31 DIAGNOSIS — G629 Polyneuropathy, unspecified: Secondary | ICD-10-CM

## 2017-01-31 DIAGNOSIS — M6281 Muscle weakness (generalized): Secondary | ICD-10-CM | POA: Diagnosis not present

## 2017-01-31 DIAGNOSIS — N183 Chronic kidney disease, stage 3 unspecified: Secondary | ICD-10-CM

## 2017-01-31 DIAGNOSIS — J449 Chronic obstructive pulmonary disease, unspecified: Secondary | ICD-10-CM | POA: Diagnosis not present

## 2017-01-31 DIAGNOSIS — I471 Supraventricular tachycardia: Secondary | ICD-10-CM | POA: Diagnosis not present

## 2017-01-31 DIAGNOSIS — G894 Chronic pain syndrome: Secondary | ICD-10-CM

## 2017-01-31 DIAGNOSIS — Z8719 Personal history of other diseases of the digestive system: Secondary | ICD-10-CM | POA: Diagnosis not present

## 2017-01-31 DIAGNOSIS — S82842S Displaced bimalleolar fracture of left lower leg, sequela: Secondary | ICD-10-CM | POA: Diagnosis not present

## 2017-01-31 DIAGNOSIS — R262 Difficulty in walking, not elsewhere classified: Secondary | ICD-10-CM | POA: Diagnosis not present

## 2017-01-31 IMAGING — DX DG ABDOMEN 1V
1 series · 1 of 1 positions shown · non-contrast
Comparison: AP view of the pelvis dated January 12, 2016 and
abdominal and pelvic CT scan November 17, 2015

CLINICAL DATA: Abdominal distention and lower abdominal pain
associated with diarrhea. Symptoms began 1 day ago.

EXAM:
ABDOMEN - 1 VIEW

[abdomen kub]
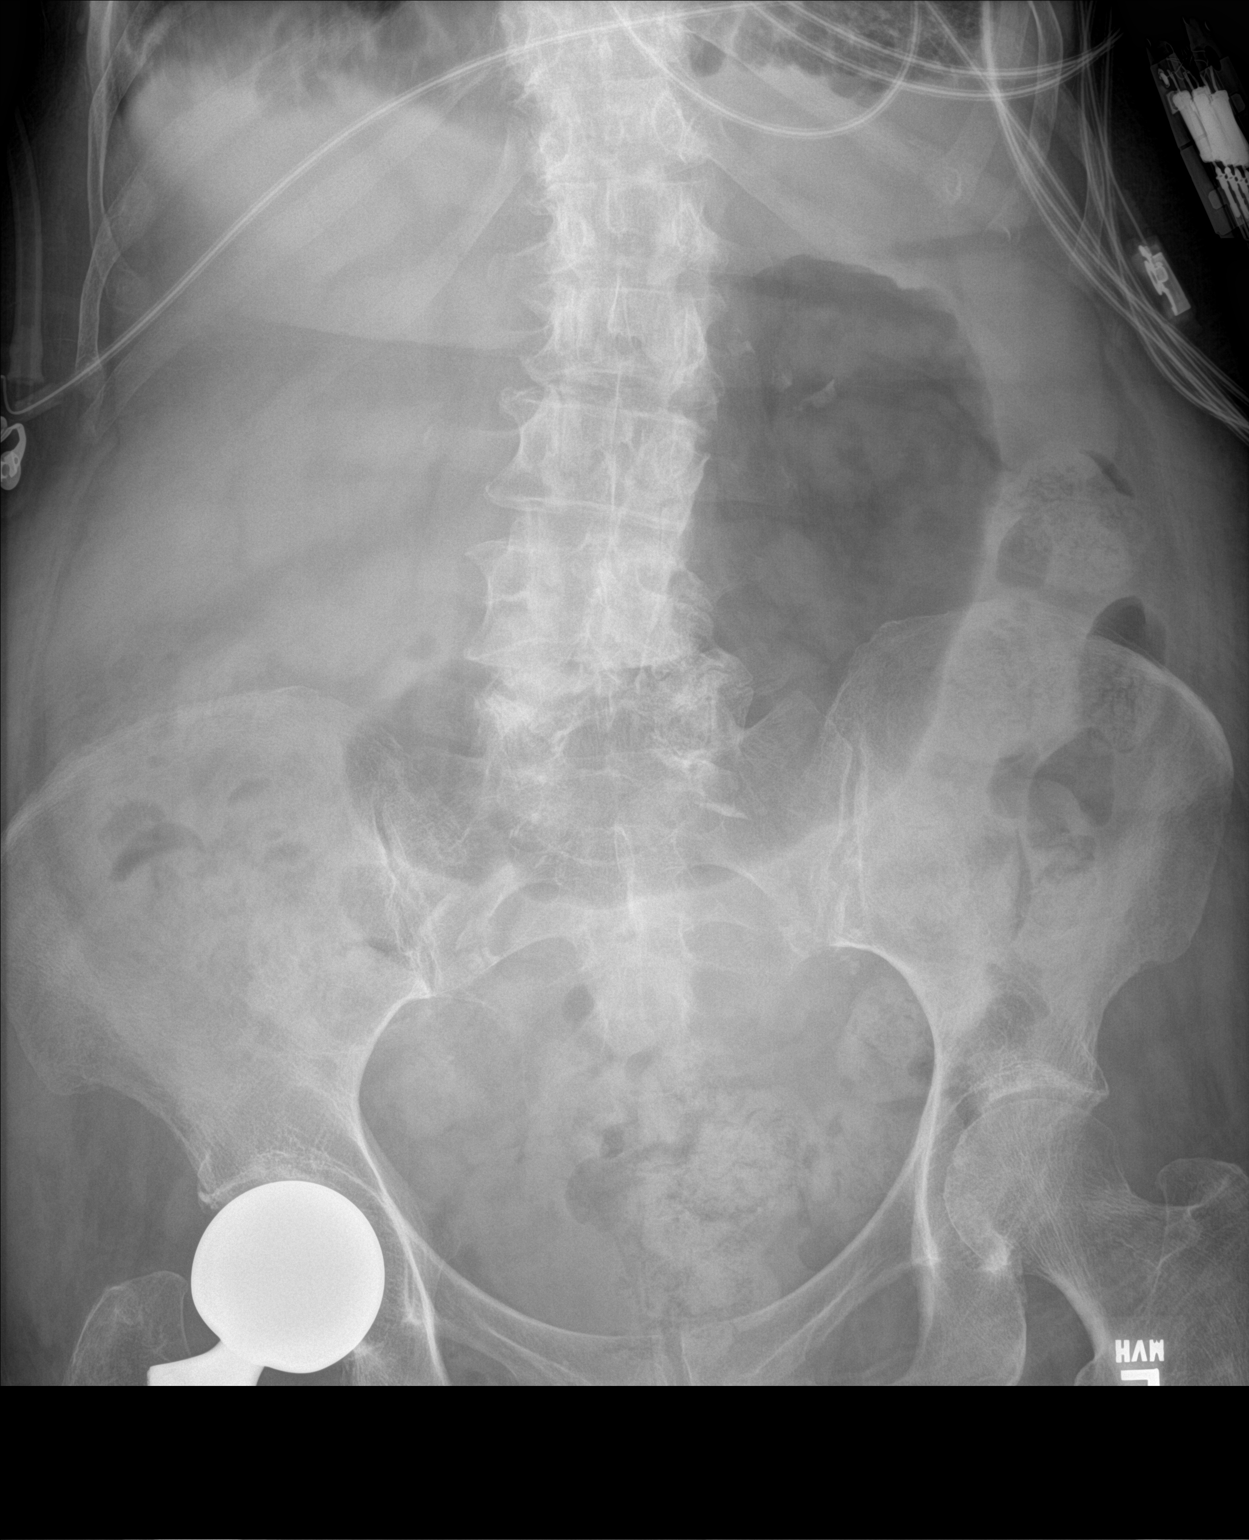

[1 of 1 positions shown; findings below may reference images not displayed]

FINDINGS: There is moderate gaseous distention of the stomach. There is no
evidence of a small or large bowel obstruction. The colonic stool
burden is moderate. No free extraluminal gas collections are
observed. There is degenerative change in levocurvature of the
lumbar spine. There is a prosthetic right hip joint. There are
calcifications in the wall of the abdominal aorta as well as within
the splenic artery.
IMPRESSION: There is moderate gaseous distention of the stomach. No evidence of
small or large bowel obstruction. Moderately increased colonic stool
burden may reflect constipation in the appropriate clinical setting.
It may be useful to consider the patient for abdominal and pelvic CT
scanning.

Abdominal aortic atherosclerosis.

## 2017-01-31 NOTE — Progress Notes (Signed)
Location:  [facility] Room Number: 182-X Place of Service:  SNF (31) Provider:  Durenda Age, NP  Patient Care Team: [name], MD as PCP - General (Internal Medicine) Medina-Vargas, [name], NP as Nurse Practitioner (Internal Medicine)  Extended Emergency Contact Information Primary Emergency Contact: Hatfield,Lisa Address: [address]          [place], [place] [address] [place] Work Phone: [phone] Mobile Phone: [phone] Relation: Daughter  Code Status:  Full Code  Goals of care: Advanced Directive information Advanced Directives 11/08/2016  Does Patient Have a Medical Advance Directive? No  Type of Advance Directive -  Does patient want to make changes to medical advance directive? -  Would patient like information on creating a medical advance directive? No - Patient declined     Chief Complaint  Patient presents with  . Medical Management of Chronic Issues    Routine [facility] visit    HPI:  Pt is a 77 y.o. female seen today for medical management of chronic diseases.  She is a long-term care resident of [facility].  She has a PMH of COPD, anemia, hyperthyroidism, anemia, and depression. She was seen in her room today. Left foot boot was recently discontinued. She had a left ankle bimalleolar fracture and is now able to wear her own shoes instead of a CAM boot.    Past Medical History:  Diagnosis Date  . COPD (chronic obstructive pulmonary disease) ([place])   . History of anxiety   . History of depression   . Spinal stenosis   . UTI (urinary tract infection)    Past Surgical History:  Procedure Laterality Date  . FLEXIBLE SIGMOIDOSCOPY Left 11/18/2015   Procedure: FLEXIBLE SIGMOIDOSCOPY;  Surgeon: [name], MD;  Location: [place];  Service: Endoscopy;  Laterality: Left;  . FLEXIBLE SIGMOIDOSCOPY N/A 11/20/2015   Procedure: FLEXIBLE SIGMOIDOSCOPY;  Surgeon: [name],  MD;  Location: [facility] ENDOSCOPY;  Service: Endoscopy;  Laterality: N/A;  . HIP ARTHROPLASTY Right 01/12/2016   Procedure: ARTHROPLASTY BIPOLAR HIP (HEMIARTHROPLASTY);  Surgeon: [name], MD;  Location: [facility];  Service: Orthopedics;  Laterality: Right;    No Known Allergies  Outpatient Encounter Medications as of 01/31/2017  Medication Sig  . acetaminophen (TYLENOL) 325 MG tablet Take 650 mg by mouth every 8 (eight) hours as needed.  Marland Kitchen aspirin 81 MG chewable tablet Chew 81 mg by mouth 2 (two) times daily.  . bisacodyl (DULCOLAX) 10 MG suppository Place 10 mg rectally as needed for moderate constipation.  . Calcium & Magnesium Carbonates (MYLANTA PO) Take 30 mLs by mouth every 2 (two) hours as needed (Indigestion, notify MD/NP if not relieved in 24 hours).  . diclofenac sodium (VOLTAREN) 1 % GEL Apply 2 g topically at bedtime. Apply to left posterior chest  . digoxin (LANOXIN) 0.125 MG tablet Take 0.125 mg by mouth daily. Hold if HR <60  . docusate sodium (COLACE) 100 MG capsule Take 1 capsule (100 mg total) by mouth 2 (two) times daily as needed for mild constipation.  . ferrous sulfate 325 (65 FE) MG tablet Take 325 mg by mouth daily with breakfast.   . flecainide (TAMBOCOR) 50 MG tablet Take 50 mg by mouth 2 (two) times daily.  Marland Kitchen gabapentin (NEURONTIN) 300 MG capsule Take 1 capsule (300 mg total) by mouth 3 (three) times daily.  Marland Kitchen HYDROcodone-acetaminophen (NORCO/VICODIN) 5-325 MG tablet Take 1 tablet by mouth every 12 (twelve) hours as needed for moderate pain.  Marland Kitchen  INCRUSE ELLIPTA 62.5 MCG/INH AEPB Inhale 1 puff into the lungs daily.   Marland Kitchen levalbuterol (XOPENEX) 0.63 MG/3ML nebulizer solution Take 0.63 mg by nebulization 2 (two) times daily.   Marland Kitchen levalbuterol (XOPENEX) 1.25 MG/3ML nebulizer solution Take 1.25 mg by nebulization every 6 (six) hours as needed for wheezing.  . magnesium hydroxide (MILK OF MAGNESIA) 400 MG/5ML suspension Take 30 mLs by mouth daily as needed for mild constipation.  .  mirtazapine (REMERON) 15 MG tablet Take 15 mg by mouth at bedtime.  . naphazoline-glycerin (CLEAR EYES REDNESS RELIEF) 0.012-0.2 % SOLN Place 1 drop into both eyes 2 (two) times daily.  Marland Kitchen NUTRITIONAL SUPPLEMENT LIQD Take 120 mLs by mouth 2 (two) times daily. MedPass  . ondansetron (ZOFRAN) 4 MG tablet Take 4 mg by mouth 2 (two) times daily as needed.   . pantoprazole (PROTONIX) 40 MG tablet Take 40 mg by mouth daily.  . promethazine (PHENERGAN) 25 MG/ML injection Inject 25 mg into the muscle every 6 (six) hours as needed for nausea or vomiting.  . sertraline (ZOLOFT) 50 MG tablet Take 50 mg by mouth daily.  . Sodium Phosphates (RA SALINE ENEMA) 19-7 GM/118ML ENEM Place 1 each rectally as needed (for constipation).  . [DISCONTINUED] HYDROcodone-acetaminophen (NORCO/VICODIN) 5-325 MG tablet Take 0.5 tablets by mouth every 12 (twelve) hours as needed for severe pain.   No facility-administered encounter medications on file as of 01/31/2017.     Review of Systems  GENERAL: No change in appetite, no fatigue, no weight changes, no fever, chills or weakness MOUTH and THROAT: Denies oral discomfort, gingival pain or bleeding RESPIRATORY: no cough, SOB, DOE, wheezing, hemoptysis CARDIAC: No chest pain, edema or palpitations GI: No abdominal pain, diarrhea, constipation, heart burn, nausea or vomiting GU: Denies dysuria, frequency, hematuria, incontinence, or discharge PSYCHIATRIC: Denies feelings of depression or anxiety. No report of hallucinations, insomnia, paranoia, or agitation   Immunization History  Administered Date(s) Administered  . Influenza, High Dose Seasonal PF 10/04/2016  . Influenza,inj,Quad PF,6+ Mos 01/13/2016  . Pneumococcal-Unspecified 10/11/2011  . Tdap 01/23/2016   Pertinent  Health Maintenance Due  Topic Date Due  . PNA vac Low Risk Adult (2 of 2 - PCV13) 01/10/2018 (Originally 10/10/2012)  . DEXA SCAN  01/11/2028 (Originally 07/06/2004)  . INFLUENZA VACCINE  Completed     Fall Risk  11/08/2016 03/01/2016 01/20/2016  Falls in the past year? Yes Yes Yes  Number falls in past yr: 2 or more 2 or more 1  Injury with Fall? Yes Yes Yes  Comment R hip, L ankle - femoral neck fracture that required surgery  Risk Factor Category  - High Fall Risk -      Vitals:   01/31/17 0917  BP: 124/76  Pulse: 81  Resp: 18  Temp: 98 F (36.7 C)  TempSrc: Oral  SpO2: 95%  Weight: 143 lb 12.8 oz (65.2 kg)  Height: 5\' 8"  (1.727 m)   Body mass index is 21.86 kg/m.  Physical Exam  GENERAL APPEARANCE: Well nourished. In no acute distress. Normal body habitus SKIN:  Skin is warm and dry.  MOUTH and THROAT: Lips are without lesions. Oral mucosa is moist and without lesions. Tongue is normal in shape, size, and color and without lesions RESPIRATORY: Breathing is even & unlabored, BS CTAB, O2 @ 2.5L/min via [place] continuously CARDIAC: RRR, no murmur,no extra heart sounds, no edema GI: Abdomen soft, normal BS, no masses, no tenderness EXTREMITIES:  Able to move X 4 extremities PSYCHIATRIC: Alert and oriented  X 3. Affect and behavior are appropriate   Labs reviewed: Recent Labs    10/06/16 0321 10/06/16 1943 10/07/16 0347 11/04/16 11/24/16  NA 140 141 140 143 140  K 4.2 4.0 3.5 4.3 4.7  CL 102 102 103  --   --   CO2 32 31 31  --   --   GLUCOSE 90 104* 96  --   --   BUN 23* 23* 20 22* 15  CREATININE 1.54* 1.36* 1.21* 1.2* 1.3*  CALCIUM 8.4* 8.0* 8.1*  --   --   MG 2.0  --  1.9  --   --   PHOS 3.0  --  2.6  --   --    Recent Labs    05/12/16 10/06/16 0321 10/07/16 0347  AST 13 22 15   ALT 8 16 12*  ALKPHOS 80 88 88  BILITOT  --  0.7 0.5  PROT  --  6.4* 6.0*  ALBUMIN  --  2.7* 2.8*   Recent Labs    10/05/16 0342 10/06/16 0321 10/07/16 0347 11/04/16  WBC 7.3 5.1 5.4 5.1  NEUTROABS  --  2.9 3.0 2  HGB 9.7* 8.8* 9.0* 10.2*  HCT 32.2* 29.0* 29.0* 31*  MCV 100.0 100.3* 99.7  --   PLT 158 142* 160 161   Lab Results  Component Value Date   TSH 2.51  12/08/2016   Lab Results  Component Value Date   HGBA1C 5.2 12/01/2015   Lab Results  Component Value Date   CHOL 107 12/01/2015   HDL 27 (L) 12/01/2015   LDLCALC 59 12/01/2015   TRIG 107 12/01/2015   CHOLHDL 4.0 12/01/2015    Assessment/Plan  1. SVT (supraventricular tachycardia) (HCC) - rate-controlled, continue flecainide 50 mg 1 tab every 12 hours, Digoxin 125 mcg 1 tab daily   2. Anemia, unspecified type - continue FeSO4 325 mg 1 tab daily Lab Results  Component Value Date   HGB 10.2 (A) 11/04/2016     3. Neuropathy - continue Gabapentin 300 mg 1 capsule PO TID, Norco 5-325 mg 1 tab Q 12 hours PRN and Acetaminophen 325 mg 2 tabs = 650 mg Q 8 hours PRN   4. Chronic obstructive pulmonary disease, unspecified COPD type ([place]) - no wheezing nor SOB, continuing Levalbuterol neb every 6 hours when necessary Levalbuterol neb BID, Incruse Ellipta 62.5 mcg INH inhale 1 puff by mouth daily   5. Bimalleolar ankle fracture, left, sequela - CAM boot was recently discontinued, currently able to use shoes to ambulate, follow-up with orthopedics   6. Depression, recurrent ([place]) - mood is stable, continue Mirtazapine 15 mg 1 tab daily and Sertraline 50 mg 1 tab daily   7. CKD (chronic kidney disease) stage 3, GFR 30-59 ml/min (HCC) - stable Lab Results  Component Value Date   CREATININE 1.3 (A) 11/24/2016     8. Chronic pain syndrome well-controlled, continue Acetaminophen 325 mg 2 tabs = 650 mg every 8 hours when necessary and Norco 5-325 mg 1 tab every 12 hours when necessary  9. History of GI Bleed - no recent GI bleed, continue Pantoprazole 40 mg 1 tab daily    Family/ staff Communication: Discussed plan of care with resident.  Labs/tests ordered:  None  Goals of care:   Long-term care   Durenda Age, NP [facility] [phone] (Monday-Friday 8:00 a.m. - 5:00 p.m.) [phone] (after hours)

## 2017-02-01 DIAGNOSIS — M6281 Muscle weakness (generalized): Secondary | ICD-10-CM | POA: Diagnosis not present

## 2017-02-01 DIAGNOSIS — R262 Difficulty in walking, not elsewhere classified: Secondary | ICD-10-CM | POA: Diagnosis not present

## 2017-02-01 DIAGNOSIS — S82842D Displaced bimalleolar fracture of left lower leg, subsequent encounter for closed fracture with routine healing: Secondary | ICD-10-CM | POA: Diagnosis not present

## 2017-02-02 DIAGNOSIS — R262 Difficulty in walking, not elsewhere classified: Secondary | ICD-10-CM | POA: Diagnosis not present

## 2017-02-02 DIAGNOSIS — S82842D Displaced bimalleolar fracture of left lower leg, subsequent encounter for closed fracture with routine healing: Secondary | ICD-10-CM | POA: Diagnosis not present

## 2017-02-02 DIAGNOSIS — M6281 Muscle weakness (generalized): Secondary | ICD-10-CM | POA: Diagnosis not present

## 2017-02-03 DIAGNOSIS — M6281 Muscle weakness (generalized): Secondary | ICD-10-CM | POA: Diagnosis not present

## 2017-02-03 DIAGNOSIS — R262 Difficulty in walking, not elsewhere classified: Secondary | ICD-10-CM | POA: Diagnosis not present

## 2017-02-03 DIAGNOSIS — S82842D Displaced bimalleolar fracture of left lower leg, subsequent encounter for closed fracture with routine healing: Secondary | ICD-10-CM | POA: Diagnosis not present

## 2017-02-04 DIAGNOSIS — M6281 Muscle weakness (generalized): Secondary | ICD-10-CM | POA: Diagnosis not present

## 2017-02-04 DIAGNOSIS — R262 Difficulty in walking, not elsewhere classified: Secondary | ICD-10-CM | POA: Diagnosis not present

## 2017-02-04 DIAGNOSIS — S82842D Displaced bimalleolar fracture of left lower leg, subsequent encounter for closed fracture with routine healing: Secondary | ICD-10-CM | POA: Diagnosis not present

## 2017-02-06 DIAGNOSIS — R262 Difficulty in walking, not elsewhere classified: Secondary | ICD-10-CM | POA: Diagnosis not present

## 2017-02-06 DIAGNOSIS — M6281 Muscle weakness (generalized): Secondary | ICD-10-CM | POA: Diagnosis not present

## 2017-02-06 DIAGNOSIS — S82842D Displaced bimalleolar fracture of left lower leg, subsequent encounter for closed fracture with routine healing: Secondary | ICD-10-CM | POA: Diagnosis not present

## 2017-02-07 DIAGNOSIS — R262 Difficulty in walking, not elsewhere classified: Secondary | ICD-10-CM | POA: Diagnosis not present

## 2017-02-07 DIAGNOSIS — M6281 Muscle weakness (generalized): Secondary | ICD-10-CM | POA: Diagnosis not present

## 2017-02-07 DIAGNOSIS — S82842D Displaced bimalleolar fracture of left lower leg, subsequent encounter for closed fracture with routine healing: Secondary | ICD-10-CM | POA: Diagnosis not present

## 2017-02-08 DIAGNOSIS — R262 Difficulty in walking, not elsewhere classified: Secondary | ICD-10-CM | POA: Diagnosis not present

## 2017-02-08 DIAGNOSIS — S82842D Displaced bimalleolar fracture of left lower leg, subsequent encounter for closed fracture with routine healing: Secondary | ICD-10-CM | POA: Diagnosis not present

## 2017-02-08 DIAGNOSIS — M6281 Muscle weakness (generalized): Secondary | ICD-10-CM | POA: Diagnosis not present

## 2017-02-09 DIAGNOSIS — M6281 Muscle weakness (generalized): Secondary | ICD-10-CM | POA: Diagnosis not present

## 2017-02-09 DIAGNOSIS — S82842D Displaced bimalleolar fracture of left lower leg, subsequent encounter for closed fracture with routine healing: Secondary | ICD-10-CM | POA: Diagnosis not present

## 2017-02-09 DIAGNOSIS — R262 Difficulty in walking, not elsewhere classified: Secondary | ICD-10-CM | POA: Diagnosis not present

## 2017-02-10 DIAGNOSIS — M6281 Muscle weakness (generalized): Secondary | ICD-10-CM | POA: Diagnosis not present

## 2017-02-10 DIAGNOSIS — S82842D Displaced bimalleolar fracture of left lower leg, subsequent encounter for closed fracture with routine healing: Secondary | ICD-10-CM | POA: Diagnosis not present

## 2017-02-13 ENCOUNTER — Other Ambulatory Visit: Payer: Self-pay

## 2017-02-13 MED ORDER — HYDROCODONE-ACETAMINOPHEN 5-325 MG PO TABS: 1.0000 | ORAL_TABLET | Freq: Two times a day (BID) | ORAL | 0 refills | Status: DC | PRN

## 2017-02-13 NOTE — Telephone Encounter (Signed)
Hard script was given to [facility], nurse on [employer].

## 2017-02-22 DIAGNOSIS — S82842D Displaced bimalleolar fracture of left lower leg, subsequent encounter for closed fracture with routine healing: Secondary | ICD-10-CM | POA: Diagnosis not present

## 2017-02-22 DIAGNOSIS — M25572 Pain in left ankle and joints of left foot: Secondary | ICD-10-CM | POA: Diagnosis not present

## 2017-03-09 ENCOUNTER — Encounter: Payer: Self-pay | Admitting: Adult Health

## 2017-03-09 ENCOUNTER — Non-Acute Institutional Stay (SKILLED_NURSING_FACILITY): Payer: Medicare Other | Admitting: Adult Health

## 2017-03-09 DIAGNOSIS — G629 Polyneuropathy, unspecified: Secondary | ICD-10-CM

## 2017-03-09 DIAGNOSIS — G894 Chronic pain syndrome: Secondary | ICD-10-CM

## 2017-03-09 DIAGNOSIS — N183 Chronic kidney disease, stage 3 unspecified: Secondary | ICD-10-CM

## 2017-03-09 DIAGNOSIS — I471 Supraventricular tachycardia, unspecified: Secondary | ICD-10-CM

## 2017-03-09 DIAGNOSIS — J449 Chronic obstructive pulmonary disease, unspecified: Secondary | ICD-10-CM

## 2017-03-09 DIAGNOSIS — Z8719 Personal history of other diseases of the digestive system: Secondary | ICD-10-CM

## 2017-03-09 DIAGNOSIS — F339 Major depressive disorder, recurrent, unspecified: Secondary | ICD-10-CM | POA: Diagnosis not present

## 2017-03-09 DIAGNOSIS — D649 Anemia, unspecified: Secondary | ICD-10-CM | POA: Diagnosis not present

## 2017-03-09 NOTE — Progress Notes (Signed)
Location:  [place] Room Number: 803-O Place of Service:  SNF (31) Provider:  Durenda Age, NP  Patient Care Team: [name], MD as PCP - General (Internal Medicine) Medina-Vargas, [name], NP as Nurse Practitioner (Internal Medicine)  Extended Emergency Contact Information Primary Emergency Contact: Hatfield,Lisa Address: [address]          [place], [place] [address] [place] Work Phone: [phone] Mobile Phone: [phone] Relation: Daughter  Code Status:  Full Code  Goals of care: Advanced Directive information Advanced Directives 11/08/2016  Does Patient Have a Medical Advance Directive? No  Type of Advance Directive -  Does patient want to make changes to medical advance directive? -  Would patient like information on creating a medical advance directive? No - Patient declined     Chief Complaint  Patient presents with  . Medical Management of Chronic Issues    Routine [facility] visit    HPI:  Dawn [name] is a 77 y.o. female seen today for medical management of chronic diseases. She is a long-term care resident of [facility].  She has a PMH of COPD, anemia, hyperthyroidism, and depression.She was seen in her room today. She takes digoxin for her SVT. Last digoxin level 0.79, done on 12/08/16. She denies having palpitations.    Past Medical History:  Diagnosis Date  . COPD (chronic obstructive pulmonary disease) ([place])   . History of anxiety   . History of depression   . Spinal stenosis   . UTI (urinary tract infection)    Past Surgical History:  Procedure Laterality Date  . FLEXIBLE SIGMOIDOSCOPY Left 11/18/2015   Procedure: FLEXIBLE SIGMOIDOSCOPY;  Surgeon: [name], MD;  Location: [place];  Service: Endoscopy;  Laterality: Left;  . FLEXIBLE SIGMOIDOSCOPY N/A 11/20/2015   Procedure: FLEXIBLE SIGMOIDOSCOPY;  Surgeon: [name], MD;  Location: [facility] ENDOSCOPY;  Service: Endoscopy;   Laterality: N/A;  . HIP ARTHROPLASTY Right 01/12/2016   Procedure: ARTHROPLASTY BIPOLAR HIP (HEMIARTHROPLASTY);  Surgeon: [name], MD;  Location: [facility];  Service: Orthopedics;  Laterality: Right;    No Known Allergies  Outpatient Encounter Medications as of 03/09/2017  Medication Sig  . acetaminophen (TYLENOL) 325 MG tablet Take 650 mg by mouth every 8 (eight) hours as needed.  Marland Kitchen aspirin 81 MG chewable tablet Chew 81 mg by mouth 2 (two) times daily.  . bisacodyl (DULCOLAX) 10 MG suppository Place 10 mg rectally as needed for moderate constipation.  . Calcium & Magnesium Carbonates (MYLANTA PO) Take 30 mLs by mouth every 2 (two) hours as needed (Indigestion, notify MD/NP if not relieved in 24 hours).  . diclofenac sodium (VOLTAREN) 1 % GEL Apply 2 g topically at bedtime. Apply to left posterior chest  . digoxin (LANOXIN) 0.125 MG tablet Take 0.125 mg by mouth daily. Hold if HR <60  . docusate sodium (COLACE) 100 MG capsule Take 1 capsule (100 mg total) by mouth 2 (two) times daily as needed for mild constipation.  . ferrous sulfate 325 (65 FE) MG tablet Take 325 mg by mouth daily with breakfast.   . flecainide (TAMBOCOR) 50 MG tablet Take 50 mg by mouth 2 (two) times daily.  Marland Kitchen gabapentin (NEURONTIN) 300 MG capsule Take 1 capsule (300 mg total) by mouth 3 (three) times daily.  Marland Kitchen HYDROcodone-acetaminophen (NORCO/VICODIN) 5-325 MG tablet Take 1 tablet by mouth every 12 (twelve) hours as needed for moderate pain.  . INCRUSE ELLIPTA 62.5 MCG/INH AEPB Inhale 1 puff into the lungs  daily.   . levalbuterol (XOPENEX) 1.25 MG/3ML nebulizer solution Take 1.25 mg by nebulization every 6 (six) hours as needed for wheezing.  . magnesium hydroxide (MILK OF MAGNESIA) 400 MG/5ML suspension Take 30 mLs by mouth daily as needed for mild constipation.  . mirtazapine (REMERON) 15 MG tablet Take 15 mg by mouth at bedtime.  . naphazoline-glycerin (CLEAR EYES REDNESS RELIEF) 0.012-0.2 % SOLN Place 1 drop into both  eyes 2 (two) times daily.  Marland Kitchen NUTRITIONAL SUPPLEMENT LIQD Take 120 mLs by mouth 2 (two) times daily. MedPass  . ondansetron (ZOFRAN) 4 MG tablet Take 4 mg by mouth 2 (two) times daily as needed.   . pantoprazole (PROTONIX) 40 MG tablet Take 40 mg by mouth daily.  . promethazine (PHENERGAN) 25 MG/ML injection Inject 25 mg into the muscle every 6 (six) hours as needed for nausea or vomiting.  . sertraline (ZOLOFT) 50 MG tablet Take 50 mg by mouth daily.  . Sodium Phosphates (RA SALINE ENEMA) 19-7 GM/118ML ENEM Place 1 each rectally as needed (for constipation).  . [DISCONTINUED] levalbuterol (XOPENEX) 0.63 MG/3ML nebulizer solution Take 0.63 mg by nebulization 2 (two) times daily.    No facility-administered encounter medications on file as of 03/09/2017.     Review of Systems  GENERAL: No change in appetite, no fatigue, no weight changes, no fever, chills or weakness MOUTH and THROAT: Denies oral discomfort, gingival pain or bleeding, pain from teeth or hoarseness   RESPIRATORY: no cough, SOB, DOE, wheezing, hemoptysis CARDIAC: No chest pain, edema or palpitations GI: No abdominal pain, diarrhea, constipation, heart burn, nausea or vomiting GU: Denies dysuria, frequency, hematuria, incontinence, or discharge PSYCHIATRIC: Denies feelings of depression or anxiety. No report of hallucinations, insomnia, paranoia, or agitation   Immunization History  Administered Date(s) Administered  . Influenza, High Dose Seasonal PF 10/04/2016  . Influenza,inj,Quad PF,6+ Mos 01/13/2016  . Pneumococcal-Unspecified 10/11/2011  . Tdap 01/23/2016   Pertinent  Health Maintenance Due  Topic Date Due  . PNA vac Low Risk Adult (2 of 2 - PCV13) 01/10/2018 (Originally 10/10/2012)  . DEXA SCAN  01/11/2028 (Originally 07/06/2004)  . INFLUENZA VACCINE  Completed   Fall Risk  11/08/2016 03/01/2016 01/20/2016  Falls in the past year? Yes Yes Yes  Number falls in past yr: 2 or more 2 or more 1  Injury with Fall? Yes  Yes Yes  Comment R hip, L ankle - femoral neck fracture that required surgery  Risk Factor Category  - High Fall Risk -      Vitals:   03/09/17 1211  BP: 123/75  Pulse: 62  Resp: 18  Temp: 98.6 F (37 C)  TempSrc: Oral  SpO2: 97%  Weight: 142 lb 3.2 oz (64.5 kg)  Height: 5\' 8"  (1.727 m)   Body mass index is 21.62 kg/m.  Physical Exam  GENERAL APPEARANCE: Well nourished. In no acute distress. Normal body habitus SKIN:  Skin is warm and dry.  MOUTH and THROAT: Lips are without lesions. Oral mucosa is moist and without lesions. Tongue is normal in shape, size, and color and without lesions RESPIRATORY: Breathing is even & unlabored, BS CTAB CARDIAC: RRR, no murmur,no extra heart sounds, no edema GI: Abdomen soft, normal BS, no masses, no tenderness EXTREMITIES:  Able to move X 4 extremities PSYCHIATRIC: Alert and oriented X 3. Affect and behavior are appropriate  Labs reviewed: Recent Labs    10/06/16 0321 10/06/16 1943 10/07/16 0347 11/04/16 11/24/16  NA 140 141 140 143 140  K  4.2 4.0 3.5 4.3 4.7  CL 102 102 103  --   --   CO2 32 31 31  --   --   GLUCOSE 90 104* 96  --   --   BUN 23* 23* 20 22* 15  CREATININE 1.54* 1.36* 1.21* 1.2* 1.3*  CALCIUM 8.4* 8.0* 8.1*  --   --   MG 2.0  --  1.9  --   --   PHOS 3.0  --  2.6  --   --    Recent Labs    05/12/16 10/06/16 0321 10/07/16 0347  AST 13 22 15   ALT 8 16 12*  ALKPHOS 80 88 88  BILITOT  --  0.7 0.5  PROT  --  6.4* 6.0*  ALBUMIN  --  2.7* 2.8*   Recent Labs    10/05/16 0342 10/06/16 0321 10/07/16 0347 11/04/16  WBC 7.3 5.1 5.4 5.1  NEUTROABS  --  2.9 3.0 2  HGB 9.7* 8.8* 9.0* 10.2*  HCT 32.2* 29.0* 29.0* 31*  MCV 100.0 100.3* 99.7  --   PLT 158 142* 160 161   Lab Results  Component Value Date   TSH 2.51 12/08/2016   Lab Results  Component Value Date   HGBA1C 5.2 12/01/2015   Lab Results  Component Value Date   CHOL 107 12/01/2015   HDL 27 (L) 12/01/2015   LDLCALC 59 12/01/2015   TRIG 107  12/01/2015   CHOLHDL 4.0 12/01/2015     Assessment/Plan  1. CKD (chronic kidney disease) stage 3, GFR 30-59 ml/min (HCC) - will re-check CMP   2. Chronic obstructive pulmonary disease, unspecified COPD type ([place]) - no SOB, continue [place] Ellipta 1 puff daily and Levalbuterol neb PRN   3. Anemia, unspecified type - continue FeSO4 1 tab daily Lab Results  Component Value Date   HGB 10.2 (A) 11/04/2016     4. SVT (supraventricular tachycardia) (HCC) -  Denies having palpitations,  Continue Digoxin 125 mcg daily, ASA 81 mg daily, Flecainide 50 mg 1 tab Q 12 hours,  check digoxin level   5. Neuropathy - continue Gabapentin 300 mg 1 capsule TID   6. Depression, recurrent ([place]) - mood is stable, continue Sertraline 50 mg daily and Mirtazapine 15 mg 1 tab  Q HS   7. Chronic pain syndrome - well-controlled, continue Norco 5-325 mg 1 tab Q 12 hours PRN   8. History of GI bleed - continue Pantoprazole 40 mg daily    Family/ staff Communication:  Discussed plan of care with resident.  Labs/tests ordered:  Digoxin level, CBC, CMP  Goals of care:   Long-term care   Durenda Age, NP [facility] [phone] (Monday-Friday 8:00 a.m. - 5:00 p.m.) [phone] (after hours)

## 2017-03-10 DIAGNOSIS — I4891 Unspecified atrial fibrillation: Secondary | ICD-10-CM | POA: Diagnosis not present

## 2017-03-10 DIAGNOSIS — R630 Anorexia: Secondary | ICD-10-CM | POA: Diagnosis not present

## 2017-03-10 DIAGNOSIS — D649 Anemia, unspecified: Secondary | ICD-10-CM | POA: Diagnosis not present

## 2017-03-10 LAB — CBC AND DIFFERENTIAL
HCT: 32 — AB (ref 36–46)
Hemoglobin: 11 — AB (ref 12.0–16.0)
Neutrophils Absolute: 3
Platelets: 205 (ref 150–399)
WBC: 5.8

## 2017-03-10 LAB — BASIC METABOLIC PANEL
BUN: 14 (ref 4–21)
Creatinine: 1.2 — AB (ref 0.5–1.1)
Glucose: 83
Potassium: 4.4 (ref 3.4–5.3)
Sodium: 144 (ref 137–147)

## 2017-03-10 LAB — HEPATIC FUNCTION PANEL
ALT: 8 (ref 7–35)
AST: 12 — AB (ref 13–35)
Alkaline Phosphatase: 104 (ref 25–125)
Bilirubin, Total: 0.2

## 2017-03-17 ENCOUNTER — Other Ambulatory Visit: Payer: Self-pay

## 2017-03-17 MED ORDER — HYDROCODONE-ACETAMINOPHEN 5-325 MG PO TABS: 1.0000 | ORAL_TABLET | Freq: Two times a day (BID) | ORAL | 0 refills | Status: DC | PRN

## 2017-03-17 NOTE — Telephone Encounter (Signed)
Hard script given to nurse [facility]) on [employer].

## 2017-03-30 DIAGNOSIS — R05 Cough: Secondary | ICD-10-CM | POA: Diagnosis not present

## 2017-03-30 DIAGNOSIS — M6281 Muscle weakness (generalized): Secondary | ICD-10-CM | POA: Diagnosis not present

## 2017-03-31 ENCOUNTER — Non-Acute Institutional Stay (SKILLED_NURSING_FACILITY): Payer: Medicare Other | Admitting: Adult Health

## 2017-03-31 ENCOUNTER — Encounter: Payer: Self-pay | Admitting: Adult Health

## 2017-03-31 DIAGNOSIS — R059 Cough, unspecified: Secondary | ICD-10-CM

## 2017-03-31 DIAGNOSIS — R05 Cough: Secondary | ICD-10-CM | POA: Diagnosis not present

## 2017-03-31 DIAGNOSIS — J441 Chronic obstructive pulmonary disease with (acute) exacerbation: Secondary | ICD-10-CM

## 2017-03-31 DIAGNOSIS — G4700 Insomnia, unspecified: Secondary | ICD-10-CM

## 2017-03-31 NOTE — Progress Notes (Signed)
Location:  [facility] Room Number: 009-F Place of Service:  SNF (31) Provider:  Durenda Age, NP  Patient Care Team: Dawn Limes, MD as PCP - General (Internal Medicine) Medina-Vargas, Dawn Lange, NP as Nurse Practitioner (Internal Medicine)  Extended Emergency Contact Information Primary Emergency Contact: Dawn Dawn [name] Address: [address]          [place], [place] [address] [place] Work Phone: [phone] Mobile Phone: [phone] Relation: Daughter  Code Status:  Full Code  Goals of care: Advanced Directive information Advanced Directives 11/08/2016  Does Patient Have a Medical Advance Directive? No  Type of Advance Directive -  Does patient want to make changes to medical advance directive? -  Would patient like information on creating a medical advance directive? No - Patient declined     Chief Complaint  Patient presents with  . Acute Visit    Patient with a cough and right lower lobe infilltrate and cardiomegaly demonstrated on chest x-ray.    HPI:  Pt is a 77 y.o. Dawn [name] seen today for an acute visit due to cough and cardiomegaly and a right lower lobe infiltrate noted on portable chest x-ray. She is a long-term care resident of [facility].  She has a PMH of COPD, anemia, hyperthyroidism, and depression. She was seen in the room today. She denies having fever nor body aches. chest x-ray was done due to cough. Result showed interval development of right basilar infiltrate. Chest x-ray picture reviewed by Dr. Linna Darner does not show pneumonia. She complained of insomnia and requested to be put on a medication. She verbalized that she has used Trazodone in the past.      Past Medical History:  Diagnosis Date  . COPD (chronic obstructive pulmonary disease) ([place])   . History of anxiety   . History of depression   . Spinal stenosis   . UTI (urinary tract infection)    Past Surgical History:    Procedure Laterality Date  . FLEXIBLE SIGMOIDOSCOPY Left 11/18/2015   Procedure: FLEXIBLE SIGMOIDOSCOPY;  Surgeon: [name], MD;  Location: [place];  Service: Endoscopy;  Laterality: Left;  . FLEXIBLE SIGMOIDOSCOPY N/A 11/20/2015   Procedure: FLEXIBLE SIGMOIDOSCOPY;  Surgeon: [name], MD;  Location[facility] ENDOSCOPY;  Service: Endoscopy;  Laterality: N/A;  . HIP ARTHROPLASTY Right 01/12/2016   Procedure: ARTHROPLASTY BIPOLAR HIP (HEMIARTHROPLASTY);  Surgeon: [name], MD;  Location: [facility];  Service: Orthopedics;  Laterality: Right;    No Known Allergies  Outpatient Encounter Medications as of 03/31/2017  Medication Sig  . acetaminophen (TYLENOL) 325 MG tablet Take 650 mg by mouth every 8 (eight) hours as needed.  Marland Kitchen aspirin 81 MG chewable tablet Chew 81 mg by mouth 2 (two) times daily.  . bisacodyl (DULCOLAX) 10 MG suppository Place 10 mg rectally as needed for moderate constipation.  . diclofenac sodium (VOLTAREN) 1 % GEL Apply 2 g topically at bedtime. Apply to left posterior chest  . digoxin (LANOXIN) 0.125 MG tablet Take 0.125 mg by mouth daily. Hold if HR <60  . docusate sodium (COLACE) 100 MG capsule Take 1 capsule (100 mg total) by mouth 2 (two) times daily as needed for mild constipation.  . ferrous sulfate 325 (65 FE) MG tablet Take 325 mg by mouth daily with breakfast.   . flecainide (TAMBOCOR) 50 MG tablet Take 50 mg by mouth 2 (two) times daily.  Marland Kitchen gabapentin (NEURONTIN) 300 MG capsule Take 1 capsule (300 mg total) by mouth 3 (three)  times daily.  Marland Kitchen guaifenesin (ROBITUSSIN) 100 MG/5ML syrup Take 200 mg by mouth 3 (three) times daily.  Marland Kitchen HYDROcodone-acetaminophen (NORCO/VICODIN) 5-325 MG tablet Take 1 tablet by mouth every 12 (twelve) hours as needed for moderate pain.  . INCRUSE ELLIPTA 62.5 MCG/INH AEPB Inhale 1 puff into the lungs daily.   Marland Kitchen levalbuterol (XOPENEX) 1.25 MG/3ML nebulizer solution Take 1.25 mg by nebulization every 6 (six) hours as needed for wheezing.  .  magnesium hydroxide (MILK OF MAGNESIA) 400 MG/5ML suspension Take 30 mLs by mouth daily as needed for mild constipation.  . mirtazapine (REMERON) 15 MG tablet Take 15 mg by mouth at bedtime.  . naphazoline-glycerin (CLEAR EYES REDNESS RELIEF) 0.012-0.2 % SOLN Place 1 drop into both eyes 2 (two) times daily.  Marland Kitchen NUTRITIONAL SUPPLEMENT LIQD Take 120 mLs by mouth 2 (two) times daily. MedPass  . ondansetron (ZOFRAN) 4 MG tablet Take 4 mg by mouth 2 (two) times daily as needed.   . pantoprazole (PROTONIX) 40 MG tablet Take 40 mg by mouth daily.  . promethazine (PHENERGAN) 25 MG/ML injection Inject 25 mg into the muscle every 6 (six) hours as needed for nausea or vomiting.  . sertraline (ZOLOFT) 50 MG tablet Take 50 mg by mouth daily.  . Sodium Phosphates (RA SALINE ENEMA) 19-7 GM/118ML ENEM Place 1 each rectally as needed (for constipation).  . [DISCONTINUED] Calcium & Magnesium Carbonates (MYLANTA PO) Take 30 mLs by mouth every 2 (two) hours as needed (Indigestion, notify MD/NP if not relieved in 24 hours).   No facility-administered encounter medications on file as of 03/31/2017.     Review of Systems  GENERAL: No change in appetite, no fatigue, no weight changes, no fever, chills or weakness MOUTH and THROAT: Denies oral discomfort, gingival pain or bleeding, pain from teeth or hoarseness   RESPIRATORY: +cough CARDIAC: No chest pain, edema or palpitations GI: No abdominal pain, diarrhea, constipation, heart burn, nausea or vomiting PSYCHIATRIC: +insomnia   Immunization History  Administered Date(s) Administered  . Influenza, High Dose Seasonal PF 10/04/2016  . Influenza,inj,Quad PF,6+ Mos 01/13/2016  . Pneumococcal-Unspecified 10/11/2011  . Tdap 01/23/2016   Pertinent  Health Maintenance Due  Topic Date Due  . PNA vac Low Risk Adult (2 of 2 - PCV13) 01/10/2018 (Originally 10/10/2012)  . DEXA SCAN  01/11/2028 (Originally 07/06/2004)  . INFLUENZA VACCINE  Completed   Fall Risk   11/08/2016 03/01/2016 01/20/2016  Falls in the past year? Yes Yes Yes  Number falls in past yr: 2 or more 2 or more 1  Injury with Fall? Yes Yes Yes  Comment R hip, L ankle - femoral neck fracture that required surgery  Risk Factor Category  - High Fall Risk -      Vitals:   03/31/17 1433  BP: 111/71  Pulse: 61  Resp: 18  Temp: (!) 97.1 F (36.2 C)  TempSrc: Oral  SpO2: 97%  Weight: 144 lb 9.6 oz (65.6 kg)  Height: 5\' 8"  (1.727 m)   Body mass index is 21.99 kg/m.  Physical Exam  GENERAL APPEARANCE:  In no acute distress. Normal body habitus SKIN:  Skin is warm and dry.  MOUTH and THROAT: Lips are without lesions. Oral mucosa is moist and without lesions. Tongue is normal in shape, size, and color and without lesions RESPIRATORY: Breathing is even & unlabored, BS CTAB CARDIAC: RRR, no murmur,no extra heart sounds, no edema GI: Abdomen soft, normal BS, no masses, no tenderness EXTREMITIES:  Able to move X 4 extremities  PSYCHIATRIC: Alert and oriented X 3. Affect and behavior are appropriate   Labs reviewed: Recent Labs    10/06/16 0321 10/06/16 1943 10/07/16 0347 11/04/16 11/24/16 03/10/17  NA 140 141 140 143 140 144  K 4.2 4.0 3.5 4.3 4.7 4.4  CL 102 102 103  --   --   --   CO2 32 31 31  --   --   --   GLUCOSE 90 104* 96  --   --   --   BUN 23* 23* 20 22* 15 14  CREATININE 1.54* 1.36* 1.21* 1.2* 1.3* 1.2*  CALCIUM 8.4* 8.0* 8.1*  --   --   --   MG 2.0  --  1.9  --   --   --   PHOS 3.0  --  2.6  --   --   --    Recent Labs    10/06/16 0321 10/07/16 0347 03/10/17  AST 22 15 12*  ALT 16 12* 8  ALKPHOS 88 88 104  BILITOT 0.7 0.5  --   PROT 6.4* 6.0*  --   ALBUMIN 2.7* 2.8*  --    Recent Labs    10/05/16 0342 10/06/16 0321 10/07/16 0347 11/04/16 03/10/17  WBC 7.3 5.1 5.4 5.1 5.8  NEUTROABS  --  2.9 3.0 2 3  HGB 9.7* 8.8* 9.0* 10.2* 11.0*  HCT 32.2* 29.0* 29.0* 31* 32*  MCV 100.0 100.3* 99.7  --   --   PLT 158 142* 160 161 205   Lab Results    Component Value Date   TSH 2.51 12/08/2016   Lab Results  Component Value Date   HGBA1C 5.2 12/01/2015   Lab Results  Component Value Date   CHOL 107 12/01/2015   HDL 27 (L) 12/01/2015   LDLCALC 59 12/01/2015   TRIG 107 12/01/2015   CHOLHDL 4.0 12/01/2015    Assessment/Plan  1. Cough - will start on Robitussin 100 mg/5 ml give 10 ml PO Q 6AM, 2PM, and 10 PM, Levalbuterol 1.25 mg/23ml 1 neb Q 6AM, 2PM, and 10PM  2. Insomnia -  Start Trazodone50 mg give 1/2 tab = 25 mg PO Q HS   3. COPD -  No wheezing nor SOB, continue Incruse Ellipta 62.5 mcg INH inhale 1 puff by mouth daily    Durenda Age, NP [facility] [phone] (Monday-Friday 8:00 a.m. - 5:00 p.m.) [phone] (after hours)

## 2017-04-04 ENCOUNTER — Non-Acute Institutional Stay (SKILLED_NURSING_FACILITY): Payer: Medicare Other | Admitting: Internal Medicine

## 2017-04-04 ENCOUNTER — Encounter: Payer: Self-pay | Admitting: Internal Medicine

## 2017-04-04 DIAGNOSIS — F339 Major depressive disorder, recurrent, unspecified: Secondary | ICD-10-CM

## 2017-04-04 DIAGNOSIS — R5383 Other fatigue: Secondary | ICD-10-CM

## 2017-04-04 DIAGNOSIS — R251 Tremor, unspecified: Secondary | ICD-10-CM | POA: Diagnosis not present

## 2017-04-04 DIAGNOSIS — J441 Chronic obstructive pulmonary disease with (acute) exacerbation: Secondary | ICD-10-CM | POA: Diagnosis not present

## 2017-04-04 DIAGNOSIS — R9389 Abnormal findings on diagnostic imaging of other specified body structures: Secondary | ICD-10-CM | POA: Insufficient documentation

## 2017-04-04 NOTE — Progress Notes (Signed)
Subjective:   Dawn [name] was seen in consultation in the movement disorder clinic at the request of  Medina-Vargas, Monina C*The evaluation is for tremor.  The records that were made available to me were reviewed.  Only note from SNF regarding tremor is in Dec, 2018 which describes "fine tremor" in the bilateral UE.  Tremor started approximately a year ago, but it has gotten worse, and involves the bilateral UE.  Tremor is most noticeable when using the hands, esp with putting on makeup.   There is no family hx of tremor.    Affected by caffeine:  No. (1 big cup coffee) Affected by alcohol:  Doesn't drink EtOH Affected by stress:  Yes.   Affected by fatigue:  No. Spills soup if on spoon:  No. Spills glass of liquid if full:  No. Affects ADL's (tying shoes, brushing teeth, etc):  No.  Current/Previously tried tremor medications: n/a  Current medications that may exacerbate tremor:  COPD meds (xopenex - uses this bid per patient)  Outside reports reviewed: historical medical records, lab reports, office notes and referral letter/letters.  No Known Allergies  Outpatient Encounter Medications as of 04/10/2017  Medication Sig  . acetaminophen (TYLENOL) 325 MG tablet Take 650 mg by mouth every 8 (eight) hours as needed.  Marland Kitchen aspirin 81 MG chewable tablet Chew 81 mg by mouth 2 (two) times daily.  . bisacodyl (DULCOLAX) 10 MG suppository Place 10 mg rectally as needed for moderate constipation.  . diclofenac sodium (VOLTAREN) 1 % GEL Apply 2 g topically at bedtime. Apply to left posterior chest  . digoxin (LANOXIN) 0.125 MG tablet Take 0.125 mg by mouth daily. Hold if HR <60  . docusate sodium (COLACE) 100 MG capsule Take 1 capsule (100 mg total) by mouth 2 (two) times daily as needed for mild constipation.  Marland Kitchen escitalopram (LEXAPRO) 5 MG tablet Take 5 mg by mouth daily.  . ferrous sulfate 325 (65 FE) MG tablet Take 325 mg by mouth daily with breakfast.   . gabapentin (NEURONTIN) 300 MG  capsule Take 1 capsule (300 mg total) by mouth 3 (three) times daily.  Marland Kitchen HYDROcodone-acetaminophen (NORCO/VICODIN) 5-325 MG tablet Take 1 tablet by mouth every 12 (twelve) hours as needed for moderate pain.  . INCRUSE ELLIPTA 62.5 MCG/INH AEPB Inhale 1 puff into the lungs daily.   Marland Kitchen levalbuterol (XOPENEX) 1.25 MG/3ML nebulizer solution Take 1.25 mg by nebulization every 6 (six) hours as needed for wheezing.   . magnesium hydroxide (MILK OF MAGNESIA) 400 MG/5ML suspension Take 30 mLs by mouth daily as needed for mild constipation.  . mirtazapine (REMERON) 15 MG tablet Take 15 mg by mouth at bedtime.  . naphazoline-glycerin (CLEAR EYES REDNESS RELIEF) 0.012-0.2 % SOLN Place 1 drop into both eyes 2 (two) times daily.  . pantoprazole (PROTONIX) 40 MG tablet Take 40 mg by mouth daily.  . [EXPIRED] guaifenesin (ROBITUSSIN) 100 MG/5ML syrup Take 200 mg by mouth 3 (three) times daily. Take at 6A, 2P, 10P  . [DISCONTINUED] flecainide (TAMBOCOR) 50 MG tablet Take 50 mg by mouth 2 (two) times daily.  . [DISCONTINUED] NUTRITIONAL SUPPLEMENT LIQD Take 120 mLs by mouth 2 (two) times daily. MedPass  . [DISCONTINUED] ondansetron (ZOFRAN) 4 MG tablet Take 4 mg by mouth 2 (two) times daily as needed.   . [DISCONTINUED] promethazine (PHENERGAN) 25 MG/ML injection Inject 25 mg into the muscle every 6 (six) hours as needed for nausea or vomiting.  . [DISCONTINUED] sertraline (ZOLOFT) 50 MG tablet Take  50 mg by mouth daily. Take 1/2 tablet to = 25 mg qd x3 days - ending 04/06/17  . [DISCONTINUED] Sodium Phosphates (RA SALINE ENEMA) 19-7 GM/118ML ENEM Place 1 each rectally as needed (for constipation).  . [DISCONTINUED] traZODone (DESYREL) 50 MG tablet Take 25 mg by mouth at bedtime. Take 1/2 tablet to = 25 mg QHS for insomnia   No facility-administered encounter medications on file as of 04/10/2017.     Past Medical History:  Diagnosis Date  . COPD (chronic obstructive pulmonary disease) ([place])   . History of anxiety     . History of depression   . Spinal stenosis   . UTI (urinary tract infection)     Past Surgical History:  Procedure Laterality Date  . ANKLE FRACTURE SURGERY Left   . CATARACT EXTRACTION, BILATERAL    . FLEXIBLE SIGMOIDOSCOPY Left 11/18/2015   Procedure: FLEXIBLE SIGMOIDOSCOPY;  Surgeon: [name], MD;  Location: [place];  Service: Endoscopy;  Laterality: Left;  . FLEXIBLE SIGMOIDOSCOPY N/A 11/20/2015   Procedure: FLEXIBLE SIGMOIDOSCOPY;  Surgeon: [name], MD;  Location: [facility] ENDOSCOPY;  Service: Endoscopy;  Laterality: N/A;  . HIP ARTHROPLASTY Right 01/12/2016   Procedure: ARTHROPLASTY BIPOLAR HIP (HEMIARTHROPLASTY);  Surgeon: [name], MD;  Location: [facility];  Service: Orthopedics;  Laterality: Right;    Social History   Socioeconomic History  . Marital status: Divorced    Spouse name: Not on file  . Number of children: Not on file  . Years of education: Not on file  . Highest education level: Not on file  Occupational History  . Not on file  Social Needs  . Financial resource strain: Not on file  . Food insecurity:    Worry: Not on file    Inability: Not on file  . Transportation needs:    Medical: Not on file    Non-medical: Not on file  Tobacco Use  . Smoking status: Former Smoker    Last attempt to quit: 12/11/2015    Years since quitting: 1.3  . Smokeless tobacco: Never Used  . Tobacco comment: Smoked age [age] up to < 1 ppd, usually half a pack per day, mainly one half pack per day  Substance and Sexual Activity  . Alcohol use: No  . Drug use: No  . Sexual activity: Not Currently  Lifestyle  . Physical activity:    Days per week: Not on file    Minutes per session: Not on file  . Stress: Not on file  Relationships  . Social connections:    Talks on phone: Not on file    Gets together: Not on file    Attends religious service: Not on file    Active member of club or organization: Not on file    Attends meetings of clubs or organizations: Not on  file    Relationship status: Not on file  . Intimate partner violence:    Fear of current or ex partner: Not on file    Emotionally abused: Not on file    Physically abused: Not on file    Forced sexual activity: Not on file  Other Topics Concern  . Not on file  Social History Narrative   She is a long-term care resident of [facility].    Family Status  Relation Name Status  . Mother  Deceased  . Father  Deceased  . MGM  Deceased  . MGF  Deceased  . PGM  Deceased  . PGF  Deceased  .  Sister 2 Alive  . Brother 2 Alive  . Daughter  Alive  . Sister  Deceased  . Brother  Deceased       plane crash  . Neg Hx  (Not Specified)    Review of Systems Some SOB if takes O2 off.  No lateralizing weakness or paresthesia.  She uses WC intermittently due to SOB.  A complete 10 system ROS was obtained and was negative apart from what is mentioned.   Objective:   VITALS:   Vitals:   04/10/17 0857  BP: 134/82  Pulse: 64  SpO2: 94%   Gen:  Appears stated age and in NAD. HEENT:  Normocephalic, atraumatic. The mucous membranes are moist. The superficial temporal arteries are without ropiness or tenderness. Cardiovascular: Regular rate and rhythm. Lungs: Clear to auscultation bilaterally. Neck: There are no carotid bruits noted bilaterally.  NEUROLOGICAL:  Orientation:  The patient is alert and oriented x 3.  Recent and remote memory are intact.  Attention span and concentration are normal.  Able to name objects and repeat without trouble.  Fund of knowledge is appropriate Cranial nerves: There is good facial symmetry. The pupils are equal round and reactive to light bilaterally. Fundoscopic exam is attempted but the disc margins are not well visualized bilaterally.  Extraocular muscles are intact and visual fields are full to confrontational testing. Speech is fluent and clear. Soft palate rises symmetrically and there is no tongue deviation. Hearing is intact to  conversational tone. Tone: Tone is good throughout. Sensation: Sensation is intact to light touch and pinprick throughout (facial, trunk, extremities). Vibration is absent at the bilateral big toe and it is overall decreased distally. There is no extinction with double simultaneous stimulation. There is no sensory dermatomal level identified. Coordination:  The patient has no dysdiadichokinesia or dysmetria.  No decremation with any form of RAMS, including alternating supination and pronation of the forearm, hand opening and closing, finger taps, heel taps and toe taps.  She has some trouble with hand opening/closing due to arthritis Motor: Strength is 5/5 in the bilateral upper and lower extremities.  Shoulder shrug is equal bilaterally.  There is no pronator drift.  There are no fasciculations noted. DTR's: Deep tendon reflexes are 2/4 at the bilateral biceps, triceps, brachioradialis, patella and 1/4 at the bilaterally achilles.  Plantar responses are downgoing bilaterally. Gait and Station: The patient pushes off of the WC to get up.  She is given a walker.  She walks slow with an antalgic gait.    MOVEMENT EXAM: Tremor:  There is tremor in the UE, noted most significantly with action.  The R is worse than the left.  There is some rest tremor.  The patient is able to draw Archimedes spirals without significant difficulty.   The patient is not able to pour water from one glass to another without spilling it.  She doesn't spill a lot but tremor is evident.  When holding the walker, she shakes with both hands.  Labs:  Lab Results  Component Value Date   TSH 2.51 12/08/2016     Chemistry      Component Value Date/Time   NA 144 03/10/2017   K 4.4 03/10/2017   CL 103 10/07/2016 0347   CO2 31 10/07/2016 0347   BUN 14 03/10/2017   CREATININE 1.2 (A) 03/10/2017   CREATININE 1.21 (H) 10/07/2016 0347   GLU 83 03/10/2017      Component Value Date/Time   CALCIUM 8.1 (L) 10/07/2016 1610  ALKPHOS 104 03/10/2017   AST 12 (A) 03/10/2017   ALT 8 03/10/2017   BILITOT 0.5 10/07/2016 0347          Assessment/Plan:   1.  Essential Tremor.  -This is evidenced by the symmetrical nature and longstanding hx of gradually getting worse.  We discussed nature and pathophysiology.  We discussed that this can continue to gradually get worse with time.  We discussed that some medications can worsen this, including her COPD meds.  We discussed medication therapy as well as surgical therapy.  She is not a beta blocker candidate given COPD.  Ultimately, the patient decided to start primidone.  Risks, benefits, side effects and alternative therapies were discussed.  The opportunity to ask questions was given and they were answered to the best of my ability.  The patient expressed understanding and willingness to follow the outlined treatment protocols.    2.  Follow up is anticipated in the next few months, sooner should new neurologic issues arise.  CC:  [name], MD

## 2017-04-04 NOTE — Assessment & Plan Note (Signed)
04/04/17 essential tremor suggested, definitive neurologic evaluation by Dr Carles Collet in April
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
## 2017-04-04 NOTE — Assessment & Plan Note (Addendum)
Instructed in use of incentive spirometer to treat right lower lobe changes which are suggestive of bronchiectasis but clinically asymptomatic

## 2017-04-04 NOTE — Assessment & Plan Note (Addendum)
Serial chest x-rays suggest possible bronchiectatic process in the right lower lobe; consider CT scan of the chest if symptoms progress

## 2017-04-04 NOTE — Assessment & Plan Note (Signed)
Psychiatry nurse practitioner actively addressing this issue

## 2017-04-05 ENCOUNTER — Encounter: Payer: Self-pay | Admitting: Internal Medicine

## 2017-04-05 ENCOUNTER — Encounter: Payer: Self-pay | Admitting: Adult Health

## 2017-04-05 ENCOUNTER — Non-Acute Institutional Stay (SKILLED_NURSING_FACILITY): Payer: Medicare Other | Admitting: Adult Health

## 2017-04-05 DIAGNOSIS — I471 Supraventricular tachycardia: Secondary | ICD-10-CM

## 2017-04-05 DIAGNOSIS — D509 Iron deficiency anemia, unspecified: Secondary | ICD-10-CM

## 2017-04-05 DIAGNOSIS — J441 Chronic obstructive pulmonary disease with (acute) exacerbation: Secondary | ICD-10-CM | POA: Diagnosis not present

## 2017-04-05 DIAGNOSIS — F339 Major depressive disorder, recurrent, unspecified: Secondary | ICD-10-CM | POA: Diagnosis not present

## 2017-04-05 DIAGNOSIS — J449 Chronic obstructive pulmonary disease, unspecified: Secondary | ICD-10-CM | POA: Diagnosis not present

## 2017-04-05 DIAGNOSIS — N183 Chronic kidney disease, stage 3 unspecified: Secondary | ICD-10-CM

## 2017-04-05 DIAGNOSIS — Z8719 Personal history of other diseases of the digestive system: Secondary | ICD-10-CM | POA: Diagnosis not present

## 2017-04-05 DIAGNOSIS — G894 Chronic pain syndrome: Secondary | ICD-10-CM | POA: Diagnosis not present

## 2017-04-05 DIAGNOSIS — E0590 Thyrotoxicosis, unspecified without thyrotoxic crisis or storm: Secondary | ICD-10-CM | POA: Diagnosis not present

## 2017-04-05 DIAGNOSIS — G629 Polyneuropathy, unspecified: Secondary | ICD-10-CM | POA: Diagnosis not present

## 2017-04-05 DIAGNOSIS — E039 Hypothyroidism, unspecified: Secondary | ICD-10-CM | POA: Diagnosis not present

## 2017-04-05 DIAGNOSIS — G4700 Insomnia, unspecified: Secondary | ICD-10-CM

## 2017-04-05 DIAGNOSIS — D649 Anemia, unspecified: Secondary | ICD-10-CM | POA: Diagnosis not present

## 2017-04-05 LAB — TSH: TSH: 4.65 (ref 0.41–5.90)

## 2017-04-05 NOTE — Patient Instructions (Signed)
See assessment and plan under each diagnosis in the problem list and acutely for this visit 
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
## 2017-04-10 ENCOUNTER — Encounter: Payer: Self-pay | Admitting: Neurology

## 2017-04-10 ENCOUNTER — Ambulatory Visit (INDEPENDENT_AMBULATORY_CARE_PROVIDER_SITE_OTHER): Payer: Medicare Other | Admitting: Neurology

## 2017-04-10 VITALS — BP 134/82 | HR 64

## 2017-04-10 DIAGNOSIS — J449 Chronic obstructive pulmonary disease, unspecified: Secondary | ICD-10-CM

## 2017-04-10 DIAGNOSIS — D649 Anemia, unspecified: Secondary | ICD-10-CM | POA: Diagnosis not present

## 2017-04-10 DIAGNOSIS — R630 Anorexia: Secondary | ICD-10-CM | POA: Diagnosis not present

## 2017-04-10 DIAGNOSIS — D519 Vitamin B12 deficiency anemia, unspecified: Secondary | ICD-10-CM | POA: Diagnosis not present

## 2017-04-10 DIAGNOSIS — G250 Essential tremor: Secondary | ICD-10-CM | POA: Diagnosis not present

## 2017-04-10 LAB — CBC AND DIFFERENTIAL
HEMATOCRIT: 36 (ref 36–46)
HEMOGLOBIN: 11.8 — AB (ref 12.0–16.0)
Neutrophils Absolute: 4
PLATELETS: 207 (ref 150–399)
WBC: 5.8

## 2017-04-10 LAB — IRON,TIBC AND FERRITIN PANEL
%SAT: 17.64
Iron: 41
TIBC: 231
UIBC: 191

## 2017-04-10 LAB — VITAMIN B12: Vitamin B-12: 573

## 2017-04-10 MED ORDER — PRIMIDONE 50 MG PO TABS
50.0000 mg | ORAL_TABLET | Freq: Every day | ORAL | 1 refills | Status: DC
Start: 2017-04-10 — End: 2017-07-14

## 2017-04-10 NOTE — Patient Instructions (Signed)
1. Start Primidone 50 mg tablets. Take 1/2 tablet for 4 nights, then increase to 1 tablet. Prescription has been sent to your pharmacy. The first dose of medication can cause some dizziness/nausea that should go away after the first dose.   

## 2017-05-02 ENCOUNTER — Non-Acute Institutional Stay (SKILLED_NURSING_FACILITY): Payer: Medicare Other | Admitting: Adult Health

## 2017-05-02 ENCOUNTER — Encounter: Payer: Self-pay | Admitting: Adult Health

## 2017-05-02 DIAGNOSIS — I471 Supraventricular tachycardia: Secondary | ICD-10-CM

## 2017-05-02 DIAGNOSIS — D500 Iron deficiency anemia secondary to blood loss (chronic): Secondary | ICD-10-CM

## 2017-05-02 DIAGNOSIS — R251 Tremor, unspecified: Secondary | ICD-10-CM | POA: Diagnosis not present

## 2017-05-02 DIAGNOSIS — J441 Chronic obstructive pulmonary disease with (acute) exacerbation: Secondary | ICD-10-CM

## 2017-05-02 DIAGNOSIS — Z8719 Personal history of other diseases of the digestive system: Secondary | ICD-10-CM

## 2017-05-02 DIAGNOSIS — F329 Major depressive disorder, single episode, unspecified: Secondary | ICD-10-CM | POA: Diagnosis not present

## 2017-05-02 DIAGNOSIS — I5032 Chronic diastolic (congestive) heart failure: Secondary | ICD-10-CM | POA: Diagnosis not present

## 2017-05-02 DIAGNOSIS — G629 Polyneuropathy, unspecified: Secondary | ICD-10-CM

## 2017-05-02 DIAGNOSIS — F32A Depression, unspecified: Secondary | ICD-10-CM

## 2017-05-02 NOTE — Progress Notes (Signed)
Location:  [facility] Room Number: 161-W Place of Service:  SNF (31) Provider:  Durenda Age, NP  Patient Care Team: [name], MD as PCP - General (Internal Medicine) Medina-Vargas, [name], NP as Nurse Practitioner (Internal Medicine)  Extended Emergency Contact Information Primary Emergency Contact: Hatfield,Lisa Address: [address]          [place], [place] [address] [place] Work Phone: [phone] Mobile Phone: [phone] Relation: Daughter  Code Status:  Full Code  Goals of care: Advanced Directive information Advanced Directives 11/08/2016  Does Patient Have a Medical Advance Directive? No  Type of Advance Directive -  Does patient want to make changes to medical advance directive? -  Would patient like information on creating a medical advance directive? No - Patient declined     Chief Complaint  Patient presents with  . Medical Management of Chronic Issues    Routine [facility] visit    HPI:  Pt is a 77 y.o. female seen today for medical management of chronic diseases.  She is a long-term care resident of [facility].  She has a PMH of COPD, anemia, hyperthyroidism, and depression. She was seen in her room today sleeping but easily woke-up to verbal greetings. She was recently started on Primidone for tremors. The neurologist does not think she does not have Parkinson's disease.   Past Medical History:  Diagnosis Date  . COPD (chronic obstructive pulmonary disease) ([place])   . History of anxiety   . History of depression   . Spinal stenosis   . UTI (urinary tract infection)    Past Surgical History:  Procedure Laterality Date  . ANKLE FRACTURE SURGERY Left   . CATARACT EXTRACTION, BILATERAL    . FLEXIBLE SIGMOIDOSCOPY Left 11/18/2015   Procedure: FLEXIBLE SIGMOIDOSCOPY;  Surgeon: [name], MD;  Location: [facility];  Service: Endoscopy;  Laterality: Left;  . FLEXIBLE  SIGMOIDOSCOPY N/A 11/20/2015   Procedure: FLEXIBLE SIGMOIDOSCOPY;  Surgeon: [name], MD;  Location: [facility] ENDOSCOPY;  Service: Endoscopy;  Laterality: N/A;  . HIP ARTHROPLASTY Right 01/12/2016   Procedure: ARTHROPLASTY BIPOLAR HIP (HEMIARTHROPLASTY);  Surgeon: [name], MD;  Location: [facility];  Service: Orthopedics;  Laterality: Right;    No Known Allergies  Outpatient Encounter Medications as of 05/02/2017  Medication Sig  . acetaminophen (TYLENOL) 325 MG tablet Take 650 mg by mouth every 8 (eight) hours as needed.  Marland Kitchen aspirin 81 MG chewable tablet Chew 81 mg by mouth 2 (two) times daily.  . bisacodyl (DULCOLAX) 10 MG suppository Place 10 mg rectally as needed for moderate constipation.  . diclofenac sodium (VOLTAREN) 1 % GEL Apply 2 g topically at bedtime. Apply to left posterior chest  . digoxin (LANOXIN) 0.125 MG tablet Take 0.125 mg by mouth daily. Hold if HR <60  . docusate sodium (COLACE) 100 MG capsule Take 1 capsule (100 mg total) by mouth 2 (two) times daily as needed for mild constipation.  Marland Kitchen escitalopram (LEXAPRO) 5 MG tablet Take 5 mg by mouth daily.  . ferrous sulfate 325 (65 FE) MG tablet Take 325 mg by mouth daily with breakfast.   . flecainide (TAMBOCOR) 50 MG tablet Take 50 mg by mouth every 12 (twelve) hours.  . gabapentin (NEURONTIN) 300 MG capsule Take 1 capsule (300 mg total) by mouth 3 (three) times daily.  Marland Kitchen HYDROcodone-acetaminophen (NORCO/VICODIN) 5-325 MG tablet Take 1 tablet by mouth every 12 (twelve) hours as needed for moderate pain.  Keenan Bachelor ELLIPTA  62.5 MCG/INH AEPB Inhale 1 puff into the lungs daily.   Marland Kitchen levalbuterol (XOPENEX) 1.25 MG/3ML nebulizer solution Take 1.25 mg by nebulization every 6 (six) hours as needed for wheezing.   . magnesium hydroxide (MILK OF MAGNESIA) 400 MG/5ML suspension Take 30 mLs by mouth daily as needed for mild constipation.  . mirtazapine (REMERON) 15 MG tablet Take 15 mg by mouth at bedtime.  . naphazoline-glycerin (CLEAR EYES  REDNESS RELIEF) 0.012-0.2 % SOLN Place 1 drop into both eyes 2 (two) times daily.  Marland Kitchen NUTRITIONAL SUPPLEMENT LIQD Take 120 mLs by mouth 2 (two) times daily.  . pantoprazole (PROTONIX) 40 MG tablet Take 40 mg by mouth daily.  . primidone (MYSOLINE) 50 MG tablet Take 1 tablet (50 mg total) by mouth at bedtime.  . traZODone (DESYREL) 50 MG tablet Take 25 mg by mouth at bedtime. Take 1/2 tablet to = 25 mg at bedtime   No facility-administered encounter medications on file as of 05/02/2017.     Review of Systems  GENERAL: No change in appetite, no fatigue, no weight changes, no fever, chills or weakness MOUTH and THROAT: Denies oral discomfort, gingival pain or bleeding, pain from teeth or hoarseness   RESPIRATORY: no cough, SOB, DOE, wheezing, hemoptysis CARDIAC: No chest pain, edema or palpitations GI: No abdominal pain, diarrhea, constipation, heart burn, nausea or vomiting GU: Denies dysuria, frequency, hematuria, incontinence, or discharge PSYCHIATRIC: Denies feelings of depression or anxiety. No report of hallucinations, insomnia, paranoia, or agitation   Immunization History  Administered Date(s) Administered  . Influenza, High Dose Seasonal PF 10/04/2016  . Influenza,inj,Quad PF,6+ Mos 01/13/2016  . Pneumococcal-Unspecified 10/11/2011  . Tdap 01/23/2016   Pertinent  Health Maintenance Due  Topic Date Due  . PNA vac Low Risk Adult (2 of 2 - PCV13) 01/10/2018 (Originally 10/10/2012)  . DEXA SCAN  01/11/2028 (Originally 07/06/2004)  . INFLUENZA VACCINE  08/10/2017   Fall Risk  04/10/2017 11/08/2016 03/01/2016 01/20/2016  Falls in the past year? Yes Yes Yes Yes  Number falls in past yr: 2 or more 2 or more 2 or more 1  Injury with Fall? Yes Yes Yes Yes  Comment - R hip, L ankle - femoral neck fracture that required surgery  Risk Factor Category  High Fall Risk - High Fall Risk -  Follow up Falls evaluation completed - - -     Vitals:   05/02/17 1145  BP: 115/62  Pulse: 64  Resp:  18  Temp: 97.6 F (36.4 C)  TempSrc: Oral  SpO2: 97%  Weight: 144 lb (65.3 kg)  Height: 5\' 8"  (1.727 m)   Body mass index is 21.9 kg/m.  Physical Exam  GENERAL APPEARANCE: Well nourished. In no acute distress. Normal body habitus SKIN:  Lipoma at back of neck MOUTH and THROAT: Lips are without lesions. Oral mucosa is moist and without lesions. Tongue is normal in shape, size, and color and without lesions RESPIRATORY: Breathing is even & unlabored, BS CTAB CARDIAC: RRR, no murmur,no extra heart sounds, no edema GI: Abdomen soft, normal BS, no masses, no tenderness EXTREMITIES:  Able to move X 4 extremities PSYCHIATRIC: Alert and oriented X 3. Affect and behavior are appropriate   Labs reviewed: Recent Labs    10/06/16 0321 10/06/16 1943 10/07/16 0347 11/04/16 11/24/16 03/10/17  NA 140 141 140 143 140 144  K 4.2 4.0 3.5 4.3 4.7 4.4  CL 102 102 103  --   --   --   CO2 32 31 31  --   --   --  GLUCOSE 90 104* 96  --   --   --   BUN 23* 23* 20 22* 15 14  CREATININE 1.54* 1.36* 1.21* 1.2* 1.3* 1.2*  CALCIUM 8.4* 8.0* 8.1*  --   --   --   MG 2.0  --  1.9  --   --   --   PHOS 3.0  --  2.6  --   --   --    Recent Labs    10/06/16 0321 10/07/16 0347 03/10/17  AST 22 15 12*  ALT 16 12* 8  ALKPHOS 88 88 104  BILITOT 0.7 0.5  --   PROT 6.4* 6.0*  --   ALBUMIN 2.7* 2.8*  --    Recent Labs    10/05/16 0342 10/06/16 0321 10/07/16 0347 11/04/16 03/10/17 04/10/17  WBC 7.3 5.1 5.4 5.1 5.8 5.8  NEUTROABS  --  2.9 3.0 2 3 4   HGB 9.7* 8.8* 9.0* 10.2* 11.0* 11.8*  HCT 32.2* 29.0* 29.0* 31* 32* 36  MCV 100.0 100.3* 99.7  --   --   --   PLT 158 142* 160 161 205 207   Lab Results  Component Value Date   TSH 2.51 12/08/2016   Lab Results  Component Value Date   HGBA1C 5.2 12/01/2015   Lab Results  Component Value Date   CHOL 107 12/01/2015   HDL 27 (L) 12/01/2015   LDLCALC 59 12/01/2015   TRIG 107 12/01/2015   CHOLHDL 4.0 12/01/2015    Assessment/Plan   1.  Chronic diastolic CHF (congestive heart failure) ([place]) -    2. Neuropathy - continue Gabapentin 300 mg 1 capsule TID   3. Blood loss anemia - discontinue FeSO4, check CBC in 1 month Lab Results  Component Value Date   HGB 11.8 (A) 04/10/2017     4. History of GI bleed - no blood in stool, continue Pantoprazole 40 mg daily   5. Tremor -  Was recently started on Primidone 50 mg Q HS   6. SVT (supraventricular tachycardia) (HCC) - rate-controlled, continue Flecainide 50 mg Q 12 hours, ASA 81 mg daily, Digoxin 125 mcg daily   7. COPD - no wheezing, continue Levalbuterol neb PRN, Incuise Ellipta 62.5 mcg 1 tab daily   8. Chronic depression -  Continue Escitalopram 5 mg daily and Mirtazapine 15 mg daily     Family/ staff Communication: Discussed plan of care with resident.  Labs/tests ordered:  CBC in 1 month  Goals of care:   Long-term care   Durenda Age, NP [facility] [phone] (Monday-Friday 8:00 a.m. - 5:00 p.m.) [phone] (after hours)

## 2017-05-03 ENCOUNTER — Other Ambulatory Visit: Payer: Self-pay

## 2017-05-03 DIAGNOSIS — L129 Pemphigoid, unspecified: Secondary | ICD-10-CM | POA: Diagnosis not present

## 2017-05-03 DIAGNOSIS — D649 Anemia, unspecified: Secondary | ICD-10-CM | POA: Diagnosis not present

## 2017-05-03 DIAGNOSIS — I5032 Chronic diastolic (congestive) heart failure: Secondary | ICD-10-CM | POA: Diagnosis not present

## 2017-05-03 DIAGNOSIS — D509 Iron deficiency anemia, unspecified: Secondary | ICD-10-CM | POA: Diagnosis not present

## 2017-05-03 MED ORDER — HYDROCODONE-ACETAMINOPHEN 5-325 MG PO TABS: 1.0000 | ORAL_TABLET | Freq: Two times a day (BID) | ORAL | 0 refills | Status: DC | PRN

## 2017-05-03 NOTE — Telephone Encounter (Signed)
Hard script given to nurse by NP.

## 2017-05-10 DIAGNOSIS — G4700 Insomnia, unspecified: Secondary | ICD-10-CM | POA: Diagnosis not present

## 2017-05-10 DIAGNOSIS — F339 Major depressive disorder, recurrent, unspecified: Secondary | ICD-10-CM | POA: Diagnosis not present

## 2017-05-23 ENCOUNTER — Encounter: Payer: Self-pay | Admitting: Adult Health

## 2017-05-23 ENCOUNTER — Non-Acute Institutional Stay (SKILLED_NURSING_FACILITY): Payer: Medicare Other | Admitting: Adult Health

## 2017-05-23 DIAGNOSIS — I5032 Chronic diastolic (congestive) heart failure: Secondary | ICD-10-CM

## 2017-05-23 DIAGNOSIS — G629 Polyneuropathy, unspecified: Secondary | ICD-10-CM

## 2017-05-23 DIAGNOSIS — J449 Chronic obstructive pulmonary disease, unspecified: Secondary | ICD-10-CM | POA: Diagnosis not present

## 2017-05-23 DIAGNOSIS — R251 Tremor, unspecified: Secondary | ICD-10-CM

## 2017-05-23 DIAGNOSIS — I471 Supraventricular tachycardia: Secondary | ICD-10-CM

## 2017-05-23 DIAGNOSIS — F339 Major depressive disorder, recurrent, unspecified: Secondary | ICD-10-CM | POA: Diagnosis not present

## 2017-05-23 DIAGNOSIS — G4700 Insomnia, unspecified: Secondary | ICD-10-CM | POA: Diagnosis not present

## 2017-05-23 NOTE — Progress Notes (Signed)
Location:  [place] Room Number: 761-P Place of Service:  SNF (31) Provider:  Durenda Age, NP  Patient Care Team: [name], MD as PCP - General (Internal Medicine) Medina-Vargas, [name], NP as Nurse Practitioner (Internal Medicine)  Extended Emergency Contact Information Primary Emergency Contact: Hatfield,Lisa Address: [address]          [place], [place] [address] [place] Work Phone: [phone] Mobile Phone: [phone] Relation: Daughter  Code Status:  Full Code  Goals of care: Advanced Directive information Advanced Directives 11/08/2016  Does Patient Have a Medical Advance Directive? No  Type of Advance Directive -  Does patient want to make changes to medical advance directive? -  Would patient like information on creating a medical advance directive? No - Patient declined     Chief Complaint  Patient presents with  . Medical Management of Chronic Issues    Routine [facility] visit    HPI:  Pt is a 77 y.o. female seen today for medical management of chronic diseases.  She is a long-term care resident of [facility].  She has a PMH of COPD, anemia, hyperthyroidism, and depression. She was seen in her room today. No tremors has been noted at rest. She was recently started with Primidone for essential tremors by neurologist.    Past Medical History:  Diagnosis Date  . COPD (chronic obstructive pulmonary disease) ([place])   . History of anxiety   . History of depression   . Spinal stenosis   . UTI (urinary tract infection)    Past Surgical History:  Procedure Laterality Date  . ANKLE FRACTURE SURGERY Left   . CATARACT EXTRACTION, BILATERAL    . FLEXIBLE SIGMOIDOSCOPY Left 11/18/2015   Procedure: FLEXIBLE SIGMOIDOSCOPY;  Surgeon: [name], MD;  Location: [place];  Service: Endoscopy;  Laterality: Left;  . FLEXIBLE SIGMOIDOSCOPY N/A 11/20/2015   Procedure: FLEXIBLE  SIGMOIDOSCOPY;  Surgeon: [name], MD;  Location: [facility] ENDOSCOPY;  Service: Endoscopy;  Laterality: N/A;  . HIP ARTHROPLASTY Right 01/12/2016   Procedure: ARTHROPLASTY BIPOLAR HIP (HEMIARTHROPLASTY);  Surgeon: [name], MD;  Location: [facility];  Service: Orthopedics;  Laterality: Right;    No Known Allergies  Outpatient Encounter Medications as of 05/23/2017  Medication Sig  . acetaminophen (TYLENOL) 325 MG tablet Take 650 mg by mouth every 8 (eight) hours as needed.  Marland Kitchen aspirin 81 MG chewable tablet Chew 81 mg by mouth 2 (two) times daily.  . bisacodyl (DULCOLAX) 10 MG suppository Place 10 mg rectally as needed for moderate constipation.  . diclofenac sodium (VOLTAREN) 1 % GEL Apply 2 g topically at bedtime. Apply to left posterior chest  . digoxin (LANOXIN) 0.125 MG tablet Take 0.125 mg by mouth daily. Hold if HR <60  . docusate sodium (COLACE) 100 MG capsule Take 1 capsule (100 mg total) by mouth 2 (two) times daily as needed for mild constipation.  Marland Kitchen escitalopram (LEXAPRO) 5 MG tablet Take 5 mg by mouth daily.  . flecainide (TAMBOCOR) 50 MG tablet Take 50 mg by mouth every 12 (twelve) hours.  . gabapentin (NEURONTIN) 300 MG capsule Take 1 capsule (300 mg total) by mouth 3 (three) times daily.  Marland Kitchen HYDROcodone-acetaminophen (NORCO/VICODIN) 5-325 MG tablet Take 1 tablet by mouth every 12 (twelve) hours as needed for moderate pain.  . INCRUSE ELLIPTA 62.5 MCG/INH AEPB Inhale 1 puff into the lungs daily.   Marland Kitchen levalbuterol (XOPENEX) 1.25 MG/3ML nebulizer solution Take 1.25 mg by nebulization every  6 (six) hours as needed for wheezing.   . magnesium hydroxide (MILK OF MAGNESIA) 400 MG/5ML suspension Take 30 mLs by mouth daily as needed for mild constipation.  . mirtazapine (REMERON) 15 MG tablet Take 15 mg by mouth at bedtime.  . naphazoline-glycerin (CLEAR EYES REDNESS RELIEF) 0.012-0.2 % SOLN Place 1 drop into both eyes 2 (two) times daily.  Marland Kitchen NUTRITIONAL SUPPLEMENT LIQD Take 120 mLs by mouth 2  (two) times daily. MedPass  . pantoprazole (PROTONIX) 40 MG tablet Take 40 mg by mouth daily.  . primidone (MYSOLINE) 50 MG tablet Take 1 tablet (50 mg total) by mouth at bedtime.  . traZODone (DESYREL) 50 MG tablet Take 25 mg by mouth at bedtime. Take 1/2 tablet to = 25 mg at bedtime  . [DISCONTINUED] ferrous sulfate 325 (65 FE) MG tablet Take 325 mg by mouth daily with breakfast.    No facility-administered encounter medications on file as of 05/23/2017.     Review of Systems  GENERAL: No change in appetite, no fatigue, no weight changes, no fever, chills or weakness MOUTH and THROAT: Denies oral discomfort, gingival pain or bleeding, pain from teeth or hoarseness   RESPIRATORY: no cough, SOB, DOE, wheezing, hemoptysis CARDIAC: No chest pain, edema or palpitations GI: No abdominal pain, diarrhea, constipation, heart burn, nausea or vomiting GU: Denies dysuria, frequency, hematuria, incontinence, or discharge PSYCHIATRIC: Denies feelings of depression or anxiety. No report of hallucinations, insomnia, paranoia, or agitation   Immunization History  Administered Date(s) Administered  . Influenza, High Dose Seasonal PF 10/04/2016  . Influenza,inj,Quad PF,6+ Mos 01/13/2016  . Pneumococcal-Unspecified 10/11/2011  . Tdap 01/23/2016   Pertinent  Health Maintenance Due  Topic Date Due  . PNA vac Low Risk Adult (2 of 2 - PCV13) 01/10/2018 (Originally 10/10/2012)  . DEXA SCAN  01/11/2028 (Originally 07/06/2004)  . INFLUENZA VACCINE  08/10/2017   Fall Risk  04/10/2017 11/08/2016 03/01/2016 01/20/2016  Falls in the past year? Yes Yes Yes Yes  Number falls in past yr: 2 or more 2 or more 2 or more 1  Injury with Fall? Yes Yes Yes Yes  Comment - R hip, L ankle - femoral neck fracture that required surgery  Risk Factor Category  High Fall Risk - High Fall Risk -  Follow up Falls evaluation completed - - -      Vitals:   05/23/17 1409  BP: 120/66  Pulse: 64  Resp: 18  Temp: 98.2 F (36.8  C)  TempSrc: Oral  SpO2: 97%  Weight: 151 lb (68.5 kg)  Height: 5\' 8"  (1.727 m)   Body mass index is 22.96 kg/m.  Physical Exam  GENERAL APPEARANCE: Well nourished. In no acute distress. Normal body habitus SKIN:  Skin is warm and dry. MOUTH and THROAT: Lips are without lesions. Oral mucosa is moist and without lesions. Tongue is normal in shape, size, and color and without lesions RESPIRATORY: Breathing is even & unlabored, BS CTAB CARDIAC: RRR, no murmur,no extra heart sounds, no edema GI: Abdomen soft, normal BS, no masses EXTREMITIES:  Able to move X 4 extremities PSYCHIATRIC: Alert and oriented X 3. Affect and behavior are appropriate  Labs reviewed: Recent Labs    10/06/16 0321 10/06/16 1943 10/07/16 0347 11/04/16 11/24/16 03/10/17  NA 140 141 140 143 140 144  K 4.2 4.0 3.5 4.3 4.7 4.4  CL 102 102 103  --   --   --   CO2 32 31 31  --   --   --  GLUCOSE 90 104* 96  --   --   --   BUN 23* 23* 20 22* 15 14  CREATININE 1.54* 1.36* 1.21* 1.2* 1.3* 1.2*  CALCIUM 8.4* 8.0* 8.1*  --   --   --   MG 2.0  --  1.9  --   --   --   PHOS 3.0  --  2.6  --   --   --    Recent Labs    10/06/16 0321 10/07/16 0347 03/10/17  AST 22 15 12*  ALT 16 12* 8  ALKPHOS 88 88 104  BILITOT 0.7 0.5  --   PROT 6.4* 6.0*  --   ALBUMIN 2.7* 2.8*  --    Recent Labs    10/05/16 0342 10/06/16 0321 10/07/16 0347 11/04/16 03/10/17 04/10/17  WBC 7.3 5.1 5.4 5.1 5.8 5.8  NEUTROABS  --  2.9 3.0 2 3 4   HGB 9.7* 8.8* 9.0* 10.2* 11.0* 11.8*  HCT 32.2* 29.0* 29.0* 31* 32* 36  MCV 100.0 100.3* 99.7  --   --   --   PLT 158 142* 160 161 205 207   Lab Results  Component Value Date   TSH 2.51 12/08/2016   Lab Results  Component Value Date   HGBA1C 5.2 12/01/2015   Lab Results  Component Value Date   CHOL 107 12/01/2015   HDL 27 (L) 12/01/2015   LDLCALC 59 12/01/2015   TRIG 107 12/01/2015   CHOLHDL 4.0 12/01/2015    Assessment/Plan  1. Chronic diastolic CHF (congestive heart  failure) ([place]) -    2. SVT (supraventricular tachycardia) (H[facility]) - stable, continue Flecainide 50 mg 1 tab Q 12 hours and Digoxin 125 mcg daily    3. Chronic obstructive pulmonary disease, unspecified COPD type ([place]) - no wheezing nor SOB, continue O2 @ 3L/min via [place], Levalbuterol neb Q 6 hours PRN, [place] Ellipta 62.5 mcg INH 1 puff daily   4. Neuropathy - continue Gabapentin 300 mg 1 capsule TID   5. Tremor - no tremors noted, continue Primidone 50 mg Q HS    Family/ staff Communication: Discussed plan of care with resident.  Labs/tests ordered:  None  Goals of care:   Long-term care  Durenda Age, NP [facility] [phone] (Monday-Friday 8:00 a.m. - 5:00 p.m.) [phone] (after hours)\

## 2017-05-26 ENCOUNTER — Other Ambulatory Visit: Payer: Self-pay

## 2017-05-26 MED ORDER — HYDROCODONE-ACETAMINOPHEN 5-325 MG PO TABS: 1.0000 | ORAL_TABLET | Freq: Two times a day (BID) | ORAL | 0 refills | Status: DC | PRN

## 2017-05-26 NOTE — Telephone Encounter (Signed)
Hard script written by NP.  CMA faxed to pharmacy and gave Rx and confirmation to facility nurse.

## 2017-05-30 DIAGNOSIS — F339 Major depressive disorder, recurrent, unspecified: Secondary | ICD-10-CM | POA: Diagnosis not present

## 2017-05-30 DIAGNOSIS — G4700 Insomnia, unspecified: Secondary | ICD-10-CM | POA: Diagnosis not present

## 2017-06-07 DIAGNOSIS — E039 Hypothyroidism, unspecified: Secondary | ICD-10-CM | POA: Diagnosis not present

## 2017-06-07 LAB — TSH: TSH: 8 — AB (ref 0.41–5.90)

## 2017-06-09 ENCOUNTER — Encounter: Payer: Self-pay | Admitting: Adult Health

## 2017-06-09 DIAGNOSIS — E039 Hypothyroidism, unspecified: Secondary | ICD-10-CM | POA: Diagnosis not present

## 2017-06-09 NOTE — Progress Notes (Deleted)
Location:  [place] Room Number: 948-N Place of Service:  SNF (31) Provider:  Durenda Age, NP  Patient Care Team: [name], MD as PCP - General (Internal Medicine) Medina-Vargas, [name], NP as Nurse Practitioner (Internal Medicine)  Extended Emergency Contact Information Primary Emergency Contact: Hatfield,Lisa Address: [address]          [place], [place] [address] [place] Work Phone: [phone] Mobile Phone: [phone] Relation: Daughter  Code Status:  Full Code  Goals of care: Advanced Directive information Advanced Directives 11/08/2016  Does Patient Have a Medical Advance Directive? No  Type of Advance Directive -  Does patient want to make changes to medical advance directive? -  Would patient like information on creating a medical advance directive? No - Patient declined     Chief Complaint  Patient presents with  . Acute Visit    Patient has an elevated TSH and has complaints of fatigue and depression.    HPI:  Pt is a 77 y.o. female seen today for an acute visit secondary to a TSH of 8 (06/07/17) and complaints of fatigue and depression.  She is a long-term care resident of [facility].  She has a PMH of COPD, anemia, hyperthyroidism, and depression.    Past Medical History:  Diagnosis Date  . COPD (chronic obstructive pulmonary disease) ([place])   . History of anxiety   . History of depression   . Spinal stenosis   . UTI (urinary tract infection)    Past Surgical History:  Procedure Laterality Date  . ANKLE FRACTURE SURGERY Left   . CATARACT EXTRACTION, BILATERAL    . FLEXIBLE SIGMOIDOSCOPY Left 11/18/2015   Procedure: FLEXIBLE SIGMOIDOSCOPY;  Surgeon: [name], MD;  Location: [place];  Service: Endoscopy;  Laterality: Left;  . FLEXIBLE SIGMOIDOSCOPY N/A 11/20/2015   Procedure: FLEXIBLE SIGMOIDOSCOPY;  Surgeon: [name], MD;  Location: [facility] ENDOSCOPY;  Service:  Endoscopy;  Laterality: N/A;  . HIP ARTHROPLASTY Right 01/12/2016   Procedure: ARTHROPLASTY BIPOLAR HIP (HEMIARTHROPLASTY);  Surgeon: [name], MD;  Location: [facility];  Service: Orthopedics;  Laterality: Right;    No Known Allergies  Outpatient Encounter Medications as of 06/09/2017  Medication Sig  . acetaminophen (TYLENOL) 325 MG tablet Take 650 mg by mouth every 8 (eight) hours as needed.  Marland Kitchen aspirin 81 MG chewable tablet Chew 81 mg by mouth 2 (two) times daily.  . bisacodyl (DULCOLAX) 10 MG suppository Place 10 mg rectally as needed for moderate constipation.  . digoxin (LANOXIN) 0.125 MG tablet Take 0.125 mg by mouth daily. Hold if HR <60  . docusate sodium (COLACE) 100 MG capsule Take 1 capsule (100 mg total) by mouth 2 (two) times daily as needed for mild constipation.  Marland Kitchen escitalopram (LEXAPRO) 5 MG tablet Take 5 mg by mouth daily.  . flecainide (TAMBOCOR) 50 MG tablet Take 50 mg by mouth every 12 (twelve) hours.  . gabapentin (NEURONTIN) 300 MG capsule Take 1 capsule (300 mg total) by mouth 3 (three) times daily.  Marland Kitchen HYDROcodone-acetaminophen (NORCO/VICODIN) 5-325 MG tablet Take 1 tablet by mouth every 12 (twelve) hours as needed for moderate pain.  . INCRUSE ELLIPTA 62.5 MCG/INH AEPB Inhale 1 puff into the lungs daily.   Marland Kitchen levalbuterol (XOPENEX) 1.25 MG/3ML nebulizer solution Take 1.25 mg by nebulization every 6 (six) hours as needed for wheezing.   Marland Kitchen loratadine (CLARITIN) 10 MG tablet Take 10 mg by mouth daily.  . magnesium hydroxide (MILK OF MAGNESIA)  400 MG/5ML suspension Take 30 mLs by mouth daily as needed for mild constipation.  . naphazoline-glycerin (CLEAR EYES REDNESS RELIEF) 0.012-0.2 % SOLN Place 1 drop into both eyes 2 (two) times daily.  Marland Kitchen NUTRITIONAL SUPPLEMENT LIQD Take 120 mLs by mouth 2 (two) times daily. MedPass  . pantoprazole (PROTONIX) 40 MG tablet Take 40 mg by mouth daily.  . primidone (MYSOLINE) 50 MG tablet Take 1 tablet (50 mg total) by mouth at bedtime.  .  traZODone (DESYREL) 50 MG tablet Take 25 mg by mouth at bedtime. Take 1/2 tablet to = 25 mg at bedtime  . [DISCONTINUED] diclofenac sodium (VOLTAREN) 1 % GEL Apply 2 g topically at bedtime. Apply to left posterior chest  . [DISCONTINUED] mirtazapine (REMERON) 15 MG tablet Take 15 mg by mouth at bedtime.   No facility-administered encounter medications on file as of 06/09/2017.     Review of Systems  GENERAL: No change in appetite, no fatigue, no weight changes, no fever, chills or weakness SKIN: Denies rash, itching, wounds, ulcer sores, or nail abnormalities EYES: Denies change in vision, dry eyes, eye pain, itching or discharge EARS: Denies change in hearing, ringing in ears, or earache NOSE: Denies nasal congestion or epistaxis MOUTH and THROAT: Denies oral discomfort, gingival pain or bleeding, pain from teeth or hoarseness   RESPIRATORY:  No cough, SOB, DOE, wheezing, hemoptysis CARDIAC: No chest pain, edema or palpitations GI: No abdominal pain, diarrhea, constipation, heart burn, nausea or vomiting GU: Denies dysuria, frequency, hematuria, incontinence, or discharge MUSCULOSKELETAL: Denies joint pain, muscle pain, back pain, restricted movement, or unusual weakness CIRCULATION: Denies claudication, edema of legs, varicosities, or cold extremities NEUROLOGICAL: Denies dizziness, syncope, numbness, or headache PSYCHIATRIC: Denies feelings of depression or anxiety. No report of hallucinations, insomnia, paranoia, or agitation ENDOCRINE: Denies polyphagia, polyuria, polydipsia, heat or cold intolerance HEME/LYMPH: Denies excessive bruising, petechia, enlarged lymph nodes, or bleeding problems IMMUNOLOGIC: Denies history of frequent infections, AIDS, or use of immunosuppressive agents   Immunization History  Administered Date(s) Administered  . Influenza, High Dose Seasonal PF 10/04/2016  . Influenza,inj,Quad PF,6+ Mos 01/13/2016  . Pneumococcal-Unspecified 10/11/2011  . Tdap  01/23/2016   Pertinent  Health Maintenance Due  Topic Date Due  . PNA vac Low Risk Adult (2 of 2 - PCV13) 01/10/2018 (Originally 10/10/2012)  . DEXA SCAN  01/11/2028 (Originally 07/06/2004)  . INFLUENZA VACCINE  08/10/2017   Fall Risk  04/10/2017 11/08/2016 03/01/2016 01/20/2016  Falls in the past year? Yes Yes Yes Yes  Number falls in past yr: 2 or more 2 or more 2 or more 1  Injury with Fall? Yes Yes Yes Yes  Comment - R hip, L ankle - femoral neck fracture that required surgery  Risk Factor Category  High Fall Risk - High Fall Risk -  Follow up Falls evaluation completed - - -      Vitals:   06/09/17 0848  Pulse: 72  Resp: 18  Temp: 98 F (36.7 C)  TempSrc: Oral  SpO2: 98%  Weight: 151 lb (68.5 kg)  Height: 5\' 8"  (1.727 m)   Body mass index is 22.96 kg/m.  Physical Exam  GENERAL APPEARANCE: Well nourished. In no acute distress. Normal body habitus SKIN:  Skin is warm and dry. There are no suspicious lesions or rash HEAD: Normal in size and contour. No evidence of trauma EYES: Lids open and close normally. No blepharitis, entropion or ectropion. PERRL. Conjunctivae are clear and sclerae are white. Lenses are without opacity EARS: Pinnae  are normal. Patient hears normal voice tunes of the examiner MOUTH and THROAT: Lips are without lesions. Oral mucosa is moist and without lesions. Tongue is normal in shape, size, and color and without lesions NECK: supple, trachea midline, no neck masses, no thyroid tenderness, no thyromegaly LYMPHATICS: No LAN in the neck, no supraclavicular LAN RESPIRATORY: Breathing is even & unlabored, BS CTAB CARDIAC: RRR, no murmur,no extra heart sounds, no edema GI: Abdomen soft, normal BS, no masses, no tenderness, no hepatomegaly, no splenomegaly MUSCULOSKELETAL: No deformities. Movement at each extremity is full and painless. Strength is 5/5 at each extremity. Back is without kyphosis or scoliosis CIRCULATION: Pedal pulses are 2+. There is no edema  of the legs, ankles and feet NEUROLOGICAL: There is no tremor. Speech is clear PSYCHIATRIC: Alert and oriented X 3. Affect and behavior are appropriate  Labs reviewed: Recent Labs    10/06/16 0321 10/06/16 1943 10/07/16 0347 11/04/16 11/24/16 03/10/17  NA 140 141 140 143 140 144  K 4.2 4.0 3.5 4.3 4.7 4.4  CL 102 102 103  --   --   --   CO2 32 31 31  --   --   --   GLUCOSE 90 104* 96  --   --   --   BUN 23* 23* 20 22* 15 14  CREATININE 1.54* 1.36* 1.21* 1.2* 1.3* 1.2*  CALCIUM 8.4* 8.0* 8.1*  --   --   --   MG 2.0  --  1.9  --   --   --   PHOS 3.0  --  2.6  --   --   --    Recent Labs    10/06/16 0321 10/07/16 0347 03/10/17  AST 22 15 12*  ALT 16 12* 8  ALKPHOS 88 88 104  BILITOT 0.7 0.5  --   PROT 6.4* 6.0*  --   ALBUMIN 2.7* 2.8*  --    Recent Labs    10/05/16 0342 10/06/16 0321 10/07/16 0347 11/04/16 03/10/17 04/10/17  WBC 7.3 5.1 5.4 5.1 5.8 5.8  NEUTROABS  --  2.9 3.0 2 3 4   HGB 9.7* 8.8* 9.0* 10.2* 11.0* 11.8*  HCT 32.2* 29.0* 29.0* 31* 32* 36  MCV 100.0 100.3* 99.7  --   --   --   PLT 158 142* 160 161 205 207   Lab Results  Component Value Date   TSH 8.00 (A) 06/07/2017   Lab Results  Component Value Date   HGBA1C 5.2 12/01/2015   Lab Results  Component Value Date   CHOL 107 12/01/2015   HDL 27 (L) 12/01/2015   LDLCALC 59 12/01/2015   TRIG 107 12/01/2015   CHOLHDL 4.0 12/01/2015    Assessment/Plan ***   Family/ staff Communication: ***  Labs/tests ordered:  ***  Goals of care:   Long-term care.   Durenda Age, NP [facility] [phone] (Monday-Friday 8:00 a.m. - 5:00 p.m.) [phone] (after hours)  This encounter was created in error - please disregard.

## 2017-06-12 ENCOUNTER — Non-Acute Institutional Stay (SKILLED_NURSING_FACILITY): Payer: Medicare Other | Admitting: Adult Health

## 2017-06-12 ENCOUNTER — Encounter: Payer: Self-pay | Admitting: Adult Health

## 2017-06-12 DIAGNOSIS — E039 Hypothyroidism, unspecified: Secondary | ICD-10-CM | POA: Diagnosis not present

## 2017-06-12 DIAGNOSIS — E038 Other specified hypothyroidism: Secondary | ICD-10-CM

## 2017-06-12 NOTE — Progress Notes (Signed)
Location:  [facility] Room Number: 409-W Place of Service:  SNF [phone] Provider:  Durenda Age, NP  Patient Care Team: [name], MD as PCP - General (Internal Medicine) Medina-Vargas, [name], NP as Nurse Practitioner (Internal Medicine)  Extended Emergency Contact Information Primary Emergency Contact: Hatfield,Lisa Address: [address]          [place], [place] [address] [place] Work Phone: [phone] Mobile Phone: [phone] Relation: Daughter  Code Status:  Full Code  Goals of care: Advanced Directive information Advanced Directives 11/08/2016  Does Patient Have a Medical Advance Directive? No  Type of Advance Directive -  Does patient want to make changes to medical advance directive? -  Would patient like information on creating a medical advance directive? No - Patient declined     Chief Complaint  Patient presents with  . Acute Visit    Patient has an elevated TSH    HPI:  Pt is a 77 y.o. female seen today for an acute visit secondary to a TSH of 8 on 06/07/17. Latest free T4 is 0.98 and T3 is 2.5. On 12/08/16 tsh 2.51,  11/04/16 tsh 4.93, 09/29/16 tsh 5.08. She has gained 7 lbs from April to May.  She is a long-term care resident of [facility].  She has a PMH of COPD, anemia, hyperthyroidism, and depression.    Past Medical History:  Diagnosis Date  . COPD (chronic obstructive pulmonary disease) ([place])   . History of anxiety   . History of depression   . Spinal stenosis   . UTI (urinary tract infection)    Past Surgical History:  Procedure Laterality Date  . ANKLE FRACTURE SURGERY Left   . CATARACT EXTRACTION, BILATERAL    . FLEXIBLE SIGMOIDOSCOPY Left 11/18/2015   Procedure: FLEXIBLE SIGMOIDOSCOPY;  Surgeon: [name], MD;  Location: [facility];  Service: Endoscopy;  Laterality: Left;  . FLEXIBLE SIGMOIDOSCOPY N/A 11/20/2015   Procedure: FLEXIBLE SIGMOIDOSCOPY;  Surgeon:  [name], MD;  Location: [facility] ENDOSCOPY;  Service: Endoscopy;  Laterality: N/A;  . HIP ARTHROPLASTY Right 01/12/2016   Procedure: ARTHROPLASTY BIPOLAR HIP (HEMIARTHROPLASTY);  Surgeon: [name], MD;  Location: [facility];  Service: Orthopedics;  Laterality: Right;    No Known Allergies  Outpatient Encounter Medications as of 06/12/2017  Medication Sig  . acetaminophen (TYLENOL) 325 MG tablet Take 650 mg by mouth every 8 (eight) hours as needed.  Marland Kitchen aspirin 81 MG chewable tablet Chew 81 mg by mouth 2 (two) times daily.  . bisacodyl (DULCOLAX) 10 MG suppository Place 10 mg rectally as needed for moderate constipation.  . digoxin (LANOXIN) 0.125 MG tablet Take 0.125 mg by mouth daily. Hold if HR <60  . docusate sodium (COLACE) 100 MG capsule Take 1 capsule (100 mg total) by mouth 2 (two) times daily as needed for mild constipation.  Marland Kitchen escitalopram (LEXAPRO) 5 MG tablet Take 5 mg by mouth daily.  . flecainide (TAMBOCOR) 50 MG tablet Take 50 mg by mouth every 12 (twelve) hours.  . gabapentin (NEURONTIN) 300 MG capsule Take 1 capsule (300 mg total) by mouth 3 (three) times daily.  Marland Kitchen HYDROcodone-acetaminophen (NORCO/VICODIN) 5-325 MG tablet Take 1 tablet by mouth every 12 (twelve) hours as needed for moderate pain.  . INCRUSE ELLIPTA 62.5 MCG/INH AEPB Inhale 1 puff into the lungs daily.   Marland Kitchen levalbuterol (XOPENEX) 1.25 MG/3ML nebulizer solution Take 1.25 mg by nebulization every 6 (six) hours as needed for wheezing.   Marland Kitchen loratadine (  CLARITIN) 10 MG tablet Take 10 mg by mouth daily.  . magnesium hydroxide (MILK OF MAGNESIA) 400 MG/5ML suspension Take 30 mLs by mouth daily as needed for mild constipation.  . naphazoline-glycerin (CLEAR EYES REDNESS RELIEF) 0.012-0.2 % SOLN Place 1 drop into both eyes 2 (two) times daily.  Marland Kitchen NUTRITIONAL SUPPLEMENT LIQD Take 120 mLs by mouth 2 (two) times daily. MedPass  . pantoprazole (PROTONIX) 40 MG tablet Take 40 mg by mouth daily.  . primidone (MYSOLINE) 50 MG tablet  Take 1 tablet (50 mg total) by mouth at bedtime.  . traZODone (DESYREL) 50 MG tablet Take 25 mg by mouth at bedtime. Take 1/2 tablet to = 25 mg at bedtime    No facility-administered encounter medications on file as of 06/12/2017.     Review of Systems  GENERAL: +weight gain MOUTH and THROAT: Denies oral discomfort, gingival pain or bleeding, pain from teeth or hoarseness   RESPIRATORY: no cough, SOB, DOE, wheezing, hemoptysis CARDIAC: No chest pain, edema or palpitations GI: No abdominal pain, diarrhea, constipation, heart burn, nausea or vomiting PSYCHIATRIC: Denies feelings of depression or anxiety. No report of hallucinations, insomnia, paranoia, or agitation    Immunization History  Administered Date(s) Administered  . Influenza, High Dose Seasonal PF 10/04/2016  . Influenza,inj,Quad PF,6+ Mos 01/13/2016  . Pneumococcal-Unspecified 10/11/2011  . Tdap 01/23/2016   Pertinent  Health Maintenance Due  Topic Date Due  . PNA vac Low Risk Adult (2 of 2 - PCV13) 01/10/2018 (Originally 10/10/2012)  . DEXA SCAN  01/11/2028 (Originally 07/06/2004)  . INFLUENZA VACCINE  08/10/2017   Fall Risk  04/10/2017 11/08/2016 03/01/2016 01/20/2016  Falls in the past year? Yes Yes Yes Yes  Number falls in past yr: 2 or more 2 or more 2 or more 1  Injury with Fall? Yes Yes Yes Yes  Comment - R hip, L ankle - femoral neck fracture that required surgery  Risk Factor Category  High Fall Risk - High Fall Risk -  Follow up Falls evaluation completed - - -      Vitals:   06/12/17 0929  BP: (!) 97/56  Pulse: 71  Resp: 18  Temp: 98 F (36.7 C)  TempSrc: Oral  SpO2: 98%  Weight: 151 lb (68.5 kg)  Height: 5\' 8"  (1.727 m)   Body mass index is 22.96 kg/m.  Physical Exam  GENERAL APPEARANCE: Well nourished. In no acute distress. Normal body habitus SKIN:  Skin is warm and dry. MOUTH and THROAT: Lips are without lesions. Oral mucosa is moist and without lesions. Tongue is normal in shape, size, and  color and without lesions RESPIRATORY: Breathing is even & unlabored, BS CTAB CARDIAC: RRR, no murmur,no extra heart sounds, no edema GI: Abdomen soft, normal BS, no masses, no tenderness EXTREMITIES:  Able to move X 4 extremities PSYCHIATRIC: Alert and oriented X 3. Affect and behavior are appropriate  Labs reviewed: Recent Labs    10/06/16 0321 10/06/16 1943 10/07/16 0347 11/04/16 11/24/16 03/10/17  NA 140 141 140 143 140 144  K 4.2 4.0 3.5 4.3 4.7 4.4  CL 102 102 103  --   --   --   CO2 32 31 31  --   --   --   GLUCOSE 90 104* 96  --   --   --   BUN 23* 23* 20 22* 15 14  CREATININE 1.54* 1.36* 1.21* 1.2* 1.3* 1.2*  CALCIUM 8.4* 8.0* 8.1*  --   --   --  MG 2.0  --  1.9  --   --   --   PHOS 3.0  --  2.6  --   --   --    Recent Labs    10/06/16 0321 10/07/16 0347 03/10/17  AST 22 15 12*  ALT 16 12* 8  ALKPHOS 88 88 104  BILITOT 0.7 0.5  --   PROT 6.4* 6.0*  --   ALBUMIN 2.7* 2.8*  --    Recent Labs    10/05/16 0342 10/06/16 0321 10/07/16 0347 11/04/16 03/10/17 04/10/17  WBC 7.3 5.1 5.4 5.1 5.8 5.8  NEUTROABS  --  2.9 3.0 2 3 4   HGB 9.7* 8.8* 9.0* 10.2* 11.0* 11.8*  HCT 32.2* 29.0* 29.0* 31* 32* 36  MCV 100.0 100.3* 99.7  --   --   --   PLT 158 142* 160 161 205 207   Lab Results  Component Value Date   TSH 8.00 (A) 06/07/2017   Lab Results  Component Value Date   HGBA1C 5.2 12/01/2015   Lab Results  Component Value Date   CHOL 107 12/01/2015   HDL 27 (L) 12/01/2015   LDLCALC 59 12/01/2015   TRIG 107 12/01/2015   CHOLHDL 4.0 12/01/2015    Assessment/Plan  1. Subclinical hypothyroidism - will refer to endocrinology and repeat tsh, free T4 or, T3 in 1 month.    Family/ staff Communication: Discussed treatment plan with resident.  Labs/tests ordered:  Tsh, free T4, T3 in 1 month  Goals of care:   Long-term care.   Durenda Age, NP [facility] [phone] (Monday-Friday 8:00 a.m. - 5:00 p.m.) [phone]  (after hours)

## 2017-06-13 DIAGNOSIS — F339 Major depressive disorder, recurrent, unspecified: Secondary | ICD-10-CM | POA: Diagnosis not present

## 2017-06-13 DIAGNOSIS — G4700 Insomnia, unspecified: Secondary | ICD-10-CM | POA: Diagnosis not present

## 2017-06-16 DIAGNOSIS — I739 Peripheral vascular disease, unspecified: Secondary | ICD-10-CM | POA: Diagnosis not present

## 2017-06-16 DIAGNOSIS — R262 Difficulty in walking, not elsewhere classified: Secondary | ICD-10-CM | POA: Diagnosis not present

## 2017-06-16 DIAGNOSIS — B351 Tinea unguium: Secondary | ICD-10-CM | POA: Diagnosis not present

## 2017-06-20 DIAGNOSIS — G4700 Insomnia, unspecified: Secondary | ICD-10-CM | POA: Diagnosis not present

## 2017-06-20 DIAGNOSIS — F339 Major depressive disorder, recurrent, unspecified: Secondary | ICD-10-CM | POA: Diagnosis not present

## 2017-06-27 ENCOUNTER — Non-Acute Institutional Stay (SKILLED_NURSING_FACILITY): Payer: Medicare Other | Admitting: Internal Medicine

## 2017-06-27 ENCOUNTER — Encounter: Payer: Self-pay | Admitting: Internal Medicine

## 2017-06-27 ENCOUNTER — Other Ambulatory Visit: Payer: Self-pay

## 2017-06-27 DIAGNOSIS — J449 Chronic obstructive pulmonary disease, unspecified: Secondary | ICD-10-CM | POA: Diagnosis not present

## 2017-06-27 DIAGNOSIS — Z961 Presence of intraocular lens: Secondary | ICD-10-CM | POA: Diagnosis not present

## 2017-06-27 DIAGNOSIS — R251 Tremor, unspecified: Secondary | ICD-10-CM

## 2017-06-27 DIAGNOSIS — H25813 Combined forms of age-related cataract, bilateral: Secondary | ICD-10-CM | POA: Diagnosis not present

## 2017-06-27 DIAGNOSIS — I5032 Chronic diastolic (congestive) heart failure: Secondary | ICD-10-CM | POA: Diagnosis not present

## 2017-06-27 DIAGNOSIS — H26491 Other secondary cataract, right eye: Secondary | ICD-10-CM | POA: Diagnosis not present

## 2017-06-27 DIAGNOSIS — H43813 Vitreous degeneration, bilateral: Secondary | ICD-10-CM | POA: Diagnosis not present

## 2017-06-27 DIAGNOSIS — E0590 Thyrotoxicosis, unspecified without thyrotoxic crisis or storm: Secondary | ICD-10-CM | POA: Diagnosis not present

## 2017-06-27 DIAGNOSIS — F339 Major depressive disorder, recurrent, unspecified: Secondary | ICD-10-CM

## 2017-06-27 DIAGNOSIS — H40013 Open angle with borderline findings, low risk, bilateral: Secondary | ICD-10-CM | POA: Diagnosis not present

## 2017-06-27 MED ORDER — HYDROCODONE-ACETAMINOPHEN 5-325 MG PO TABS: 1.0000 | ORAL_TABLET | Freq: Two times a day (BID) | ORAL | 0 refills | Status: DC | PRN

## 2017-06-27 NOTE — Patient Instructions (Signed)
See assessment and plan under each diagnosis in the problem list and acutely for this visit 

## 2017-06-27 NOTE — Assessment & Plan Note (Addendum)
06/27/17 clinically stable on present pulmonary toilet regimen Nonselective beta blocker therapy for essential tremor is not an option due to COPD and potential worsening with such therapy Continue present pulmonary toilet

## 2017-06-27 NOTE — Telephone Encounter (Signed)
Dr. Linna Darner wrote a hard script for the [facility] and gave to nurse.

## 2017-06-27 NOTE — Assessment & Plan Note (Addendum)
Nonselective beta blocker therapy such as propranolol is not an option due to COPD Benign intention tremors can be exacerbated by stimulants such as decongestants, diet pills, nicotine, or caffeine. F/U with Dr Tat if tremor problematic

## 2017-06-27 NOTE — Progress Notes (Signed)
NURSING HOME LOCATION:  [facility] ROOM NUMBER:  [id number]  CODE STATUS:  Full Code  PCP:  [name], MD  [place] [address]   This is a nursing facility follow up of chronic medical diagnoses   Interim medical record and care since last [place] [facility] [place] visit was updated with review of diagnostic studies and change in clinical status since last visit were documented.  HPI: She is a permanent resident of this SNF with medical diagnoses of COPD, essential hypertension, PSVT, chronic diastolic heart failure, subclinical hyperthyroidism, peripheral neuropathy, and chronic pain syndrome. [facility] [place] psychiatry has been following the patient for depression. The patient saw neurology, Dr. Carles Collet for essential tremor 04/10/17. Primidone was initiated as beta blocker was not an option due to COPD. To date the patient states that this has not been of benefit. She saw Dr. Katy Fitch today for routine ophthalmologic evaluation. Apparently he plans a recheck on July 24 for "a spot in the right eye". The definitive diagnosis is unclear at this point. The patient has a history of hyperthyroidism . She previously had been on Tapazole 5 mg daily prescribed by Dr. Dwyane Dee. The patient had not returned for follow-up as recommended by Dr. Dwyane Dee 06/07/16. TSH was 8.00 on 06/07/17 .This context of a history of paroxysmal supraventricular tachycardia. She exhibited a mild anemia with hemoglobin 11.8 with normal iron panel and B12 levels. Serially the anemia has improved. Renal function, chemistries and electrolytes were normal 3/1.  Review of systems: Positive for intermittent R hip pain ; DOE;tremor & ongoing depression.  Constitutional: No fever, significant weight change, fatigue  Eyes: No redness, discharge, pain, vision change ENT/mouth: No nasal congestion,  purulent discharge, earache, change in hearing, sore throat  Cardiovascular: No chest pain, palpitations, paroxysmal nocturnal  dyspnea, claudication, edema  Respiratory: No cough, sputum production, hemoptysis, significant snoring, apnea   Gastrointestinal: No heartburn, dysphagia, abdominal pain, nausea /vomiting, rectal bleeding, melena, change in bowels Genitourinary: No dysuria, hematuria, pyuria, incontinence, nocturia Dermatologic: No rash, pruritus, change in appearance of skin Neurologic: No dizziness, headache, syncope, seizures, numbness, tingling Psychiatric: No insomnia, anorexia Endocrine: No change in hair/skin/nails, excessive thirst, excessive hunger, excessive urination  Hematologic/lymphatic: No significant bruising, lymphadenopathy, abnormal bleeding Allergy/immunology: No itchy/watery eyes, significant sneezing, urticaria, angioedema  Physical exam:  Pertinent or positive findings:Thin but adequately nourished.Pupils dilated;OD pupil slightly asymmetric.Upper partial, lower plate not worn.Decreased breath sounds.Intention tremor of hands, R > L. Intermittent mandibular tremor.Nails clubbed. RLE weaker than LLE. Trace ankle edema.Decreased pedal pulses.  General appearance: no acute distress, increased work of breathing is present.   Lymphatic: No lymphadenopathy about the head, neck, axilla. Eyes: No conjunctival inflammation or lid edema is present. There is no scleral icterus. Ears:  External ear exam shows no significant lesions or deformities.   Nose:  External nasal examination shows no deformity or inflammation. Nasal mucosa are pink and moist without lesions, exudates Oral exam:  Lips and gums are healthy appearing. There is no oropharyngeal erythema or exudate. Neck:  No thyromegaly, masses, tenderness noted.    Heart:  Normal rate and regular rhythm. S1 and S2 normal without gallop, murmur, click, rub .  Lungs: without wheezes, rhonchi, rales, rubs. Abdomen: Bowel sounds are normal. Abdomen is soft and nontender with no organomegaly, hernias, masses. GU: Deferred  Extremities:  No  cyanosis Skin: Warm & dry w/o tenting. No significant lesions or rash.  See summary under each active problem in the Problem List with associated  updated therapeutic plan

## 2017-06-27 NOTE — Assessment & Plan Note (Signed)
Psych NP follow-up 06/20/17 recommended no change in therapy of Lexapro and trazodone

## 2017-06-27 NOTE — Assessment & Plan Note (Addendum)
06/27/17 Dr. Ronnie Derby office has declined to see her due to an outstanding bill With her cardiac dysrhythmia history; thyroid supplement is probably not indicated unless the TSH is consistently above 10 Recheck TSH in late July

## 2017-06-27 NOTE — Assessment & Plan Note (Signed)
06/27/17 clinically compensated

## 2017-07-04 DIAGNOSIS — G4700 Insomnia, unspecified: Secondary | ICD-10-CM | POA: Diagnosis not present

## 2017-07-04 DIAGNOSIS — F419 Anxiety disorder, unspecified: Secondary | ICD-10-CM | POA: Diagnosis not present

## 2017-07-04 DIAGNOSIS — F339 Major depressive disorder, recurrent, unspecified: Secondary | ICD-10-CM | POA: Diagnosis not present

## 2017-07-10 DIAGNOSIS — M4800 Spinal stenosis, site unspecified: Secondary | ICD-10-CM | POA: Diagnosis not present

## 2017-07-10 DIAGNOSIS — S82842D Displaced bimalleolar fracture of left lower leg, subsequent encounter for closed fracture with routine healing: Secondary | ICD-10-CM | POA: Diagnosis not present

## 2017-07-10 DIAGNOSIS — M5431 Sciatica, right side: Secondary | ICD-10-CM | POA: Diagnosis not present

## 2017-07-10 DIAGNOSIS — M6281 Muscle weakness (generalized): Secondary | ICD-10-CM | POA: Diagnosis not present

## 2017-07-10 DIAGNOSIS — G629 Polyneuropathy, unspecified: Secondary | ICD-10-CM | POA: Diagnosis not present

## 2017-07-10 DIAGNOSIS — R262 Difficulty in walking, not elsewhere classified: Secondary | ICD-10-CM | POA: Diagnosis not present

## 2017-07-11 DIAGNOSIS — F419 Anxiety disorder, unspecified: Secondary | ICD-10-CM | POA: Diagnosis not present

## 2017-07-11 DIAGNOSIS — R262 Difficulty in walking, not elsewhere classified: Secondary | ICD-10-CM | POA: Diagnosis not present

## 2017-07-11 DIAGNOSIS — M4800 Spinal stenosis, site unspecified: Secondary | ICD-10-CM | POA: Diagnosis not present

## 2017-07-11 DIAGNOSIS — M5431 Sciatica, right side: Secondary | ICD-10-CM | POA: Diagnosis not present

## 2017-07-11 DIAGNOSIS — G629 Polyneuropathy, unspecified: Secondary | ICD-10-CM | POA: Diagnosis not present

## 2017-07-11 DIAGNOSIS — M6281 Muscle weakness (generalized): Secondary | ICD-10-CM | POA: Diagnosis not present

## 2017-07-11 DIAGNOSIS — F339 Major depressive disorder, recurrent, unspecified: Secondary | ICD-10-CM | POA: Diagnosis not present

## 2017-07-11 DIAGNOSIS — G4700 Insomnia, unspecified: Secondary | ICD-10-CM | POA: Diagnosis not present

## 2017-07-11 DIAGNOSIS — S82842D Displaced bimalleolar fracture of left lower leg, subsequent encounter for closed fracture with routine healing: Secondary | ICD-10-CM | POA: Diagnosis not present

## 2017-07-11 NOTE — Progress Notes (Signed)
Subjective:   Dawn [name] was seen in consultation in the movement disorder clinic at the request of  [name], MDThe evaluation is for tremor.  The records that were made available to me were reviewed.  Only note from SNF regarding tremor is in Dec, 2018 which describes "fine tremor" in the bilateral UE.  Tremor started approximately a year ago, but it has gotten worse, and involves the bilateral UE.  Tremor is most noticeable when using the hands, esp with putting on makeup.   There is no family hx of tremor.    Affected by caffeine:  No. (1 big cup coffee) Affected by alcohol:  Doesn't drink EtOH Affected by stress:  Yes.   Affected by fatigue:  No. Spills soup if on spoon:  No. Spills glass of liquid if full:  No. Affects ADL's (tying shoes, brushing teeth, etc):  No.  Current/Previously tried tremor medications: n/a  Current medications that may exacerbate tremor:  COPD meds (xopenex - uses this bid per patient)  Outside reports reviewed: historical medical records, lab reports, office notes and referral letter/letters.  07/14/17 update: Patient is seen today in follow-up.  She was started on primidone for tremor last visit.  Records from the nursing facility have been reviewed.  The patient reported to the nursing facility that she was not sure that primidone have been beneficial.  She states that at first it seemed like it may have been beneficial but "that wore off."  She is able to drink without lids.  Poor appetite but able to eat without throwing food.  Using inhaler every morning for lungs.  No Known Allergies  Outpatient Encounter Medications as of 07/14/2017  Medication Sig  . acetaminophen (TYLENOL) 325 MG tablet Take 650 mg by mouth every 8 (eight) hours as needed.  Marland Kitchen aspirin 81 MG chewable tablet Chew 81 mg by mouth 2 (two) times daily.  . bisacodyl (DULCOLAX) 10 MG suppository Place 10 mg rectally as needed for moderate constipation.  . busPIRone (BUSPAR)  5 MG tablet Take 5 mg by mouth 3 (three) times daily.  . digoxin (LANOXIN) 0.125 MG tablet Take 0.125 mg by mouth daily. Hold if HR <60  . docusate sodium (COLACE) 100 MG capsule Take 1 capsule (100 mg total) by mouth 2 (two) times daily as needed for mild constipation.  Marland Kitchen escitalopram (LEXAPRO) 5 MG tablet Take 15 mg by mouth daily. Give 3 tablets to = 15 mg  . flecainide (TAMBOCOR) 50 MG tablet Take 50 mg by mouth every 12 (twelve) hours.  . gabapentin (NEURONTIN) 300 MG capsule Take 1 capsule (300 mg total) by mouth 3 (three) times daily.  Marland Kitchen HYDROcodone-acetaminophen (NORCO/VICODIN) 5-325 MG tablet Take 1 tablet by mouth every 12 (twelve) hours as needed for moderate pain.  . INCRUSE ELLIPTA 62.5 MCG/INH AEPB Inhale 1 puff into the lungs daily.   Marland Kitchen levalbuterol (XOPENEX) 1.25 MG/3ML nebulizer solution Take 1.25 mg by nebulization every 6 (six) hours as needed for wheezing.   Marland Kitchen loratadine (CLARITIN) 10 MG tablet Take 10 mg by mouth daily.  . magnesium hydroxide (MILK OF MAGNESIA) 400 MG/5ML suspension Take 30 mLs by mouth daily as needed for mild constipation.  . naphazoline-glycerin (CLEAR EYES REDNESS RELIEF) 0.012-0.2 % SOLN Place 1 drop into both eyes 2 (two) times daily.  Marland Kitchen NUTRITIONAL SUPPLEMENT LIQD Take 120 mLs by mouth 2 (two) times daily. MedPass  . ondansetron (ZOFRAN) 4 MG tablet Take 4 mg by mouth 2 (two) times  daily.  . pantoprazole (PROTONIX) 40 MG tablet Take 40 mg by mouth daily.  . primidone (MYSOLINE) 50 MG tablet Take 1 tablet (50 mg total) by mouth at bedtime.  . traZODone (DESYREL) 50 MG tablet Take 50 mg by mouth at bedtime.    No facility-administered encounter medications on file as of 07/14/2017.     Past Medical History:  Diagnosis Date  . COPD (chronic obstructive pulmonary disease) ([place])   . History of anxiety   . History of depression   . Spinal stenosis   . UTI (urinary tract infection)     Past Surgical History:  Procedure Laterality Date  . ANKLE  FRACTURE SURGERY Left   . CATARACT EXTRACTION, BILATERAL    . FLEXIBLE SIGMOIDOSCOPY Left 11/18/2015   Procedure: FLEXIBLE SIGMOIDOSCOPY;  Surgeon: [name], MD;  Location: [facility];  Service: Endoscopy;  Laterality: Left;  . FLEXIBLE SIGMOIDOSCOPY N/A 11/20/2015   Procedure: FLEXIBLE SIGMOIDOSCOPY;  Surgeon: [name], MD;  Location: [facility] ENDOSCOPY;  Service: Endoscopy;  Laterality: N/A;  . HIP ARTHROPLASTY Right 01/12/2016   Procedure: ARTHROPLASTY BIPOLAR HIP (HEMIARTHROPLASTY);  Surgeon: [name], MD;  [facility];  Service: Orthopedics;  Laterality: Right;    Social History   Socioeconomic History  . Marital status: Divorced    Spouse name: Not on file  . Number of children: Not on file  . Years of education: Not on file  . Highest education level: Not on file  Occupational History  . 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 Financial resource strain: Not on file  . Food insecurity:    Worry: Not on file    Inability: Not on file  . Transportation needs:    Medical: Not on file    Non-medical: Not on file  Tobacco Use  . Smoking status: Former Smoker    Last attempt to quit: 12/11/2015    Years since quitting: 1.5  . Smokeless tobacco: Never Used  . Tobacco comment: Smoked age [age] up to < 1 ppd, usually half a pack per day, mainly one half pack per day  Substance and Sexual Activity  . Alcohol use: No  . Drug use: No  . Sexual activity: Not Currently  Lifestyle  . Physical activity:    Days per week: Not on file    Minutes per session: Not on file  . Stress: Not on file  Relationships  . Social connections:    Talks on phone: Not on file    Gets together: Not on file    Attends religious service: Not on file    Active member of club or organization: Not on file    Attends meetings of clubs or organizations: Not on file    Relationship status: Not on file  . Intimate partner violence:    Fear  of current or ex partner: Not on file    Emotionally abused: Not on file    Physically abused: Not on file    Forced sexual activity: Not on file  Other Topics Concern  . Not on file  Social History Narrative   She is a long-term care resident of [facility].    Family Status  Relation Name Status  . Mother  Deceased  . Father  Deceased  . MGM  Deceased  . MGF  Deceased  . PGM  Deceased  . PGF  Deceased  . Sister 2 Alive  .  Brother 2 Alive  . Daughter  Alive  . Sister  Deceased  . Brother  Deceased       plane crash  . Neg Hx  (Not Specified)    Review of Systems Review of Systems  HENT: Negative.   Eyes: Positive for blurred vision (had laser eye surgery 2 days ago and helped).  Respiratory: Positive for shortness of breath.   Cardiovascular: Negative.   Gastrointestinal: Negative.   Musculoskeletal: Negative.   Neurological: Positive for tremors.      Objective:   VITALS:   Vitals:   07/14/17 0912  BP: 126/62  Pulse: (!) 56  SpO2: 90%   GEN:  The patient appears stated age and is in NAD. HEENT:  Normocephalic, atraumatic.  The mucous membranes are moist. The superficial temporal arteries are without ropiness or tenderness. CV:  Loletha Grayer.  regular Lungs:  CTAB.  Not wearing her O2 today Neck/HEME:  There are no carotid bruits bilaterally.  Neurological examination:  Orientation: The patient is alert and oriented x3. Cranial nerves: There is good facial symmetry. The speech is fluent and clear. Soft palate rises symmetrically and there is no tongue deviation. Hearing is intact to conversational tone. Sensation: Sensation is intact to light touch throughout Motor: Strength is at least antigravity x 4 Gait: did not ambulate pt today as did not have O2 with her      MOVEMENT EXAM: Tremor:  There is tremor in the UE, noted most significantly with action.  The R is worse than the left.  There is some rest tremor.  The patient is able  to draw Archimedes spirals without significant difficulty.  Labs:  Lab Results  Component Value Date   TSH 8.00 (A) 06/07/2017     Chemistry      Component Value Date/Time   NA 144 03/10/2017   K 4.4 03/10/2017   CL 103 10/07/2016 0347   CO2 31 10/07/2016 0347   BUN 14 03/10/2017   CREATININE 1.2 (A) 03/10/2017   CREATININE 1.21 (H) 10/07/2016 0347   GLU 83 03/10/2017      Component Value Date/Time   CALCIUM 8.1 (L) 10/07/2016 0347   ALKPHOS 104 03/10/2017   AST 12 (A) 03/10/2017   ALT 8 03/10/2017   BILITOT 0.5 10/07/2016 0347          Assessment/Plan:   1.  Essential Tremor.  -increase primidone to 50 mg bid.  May need to increase further.  Will call her in a month to see how she is doing.  New RX written 2.  F/u 6 months  CC:  [name], MD

## 2017-07-12 DIAGNOSIS — M6281 Muscle weakness (generalized): Secondary | ICD-10-CM | POA: Diagnosis not present

## 2017-07-12 DIAGNOSIS — E039 Hypothyroidism, unspecified: Secondary | ICD-10-CM | POA: Diagnosis not present

## 2017-07-12 DIAGNOSIS — M5431 Sciatica, right side: Secondary | ICD-10-CM | POA: Diagnosis not present

## 2017-07-12 DIAGNOSIS — G629 Polyneuropathy, unspecified: Secondary | ICD-10-CM | POA: Diagnosis not present

## 2017-07-12 DIAGNOSIS — Z79899 Other long term (current) drug therapy: Secondary | ICD-10-CM | POA: Diagnosis not present

## 2017-07-12 DIAGNOSIS — R262 Difficulty in walking, not elsewhere classified: Secondary | ICD-10-CM | POA: Diagnosis not present

## 2017-07-12 DIAGNOSIS — M4800 Spinal stenosis, site unspecified: Secondary | ICD-10-CM | POA: Diagnosis not present

## 2017-07-12 DIAGNOSIS — S82842D Displaced bimalleolar fracture of left lower leg, subsequent encounter for closed fracture with routine healing: Secondary | ICD-10-CM | POA: Diagnosis not present

## 2017-07-12 DIAGNOSIS — H26491 Other secondary cataract, right eye: Secondary | ICD-10-CM | POA: Diagnosis not present

## 2017-07-12 LAB — TSH: TSH: 8.96 — AB (ref 0.41–5.90)

## 2017-07-13 DIAGNOSIS — G629 Polyneuropathy, unspecified: Secondary | ICD-10-CM | POA: Diagnosis not present

## 2017-07-13 DIAGNOSIS — M5431 Sciatica, right side: Secondary | ICD-10-CM | POA: Diagnosis not present

## 2017-07-13 DIAGNOSIS — R262 Difficulty in walking, not elsewhere classified: Secondary | ICD-10-CM | POA: Diagnosis not present

## 2017-07-13 DIAGNOSIS — M6281 Muscle weakness (generalized): Secondary | ICD-10-CM | POA: Diagnosis not present

## 2017-07-13 DIAGNOSIS — S82842D Displaced bimalleolar fracture of left lower leg, subsequent encounter for closed fracture with routine healing: Secondary | ICD-10-CM | POA: Diagnosis not present

## 2017-07-13 DIAGNOSIS — M4800 Spinal stenosis, site unspecified: Secondary | ICD-10-CM | POA: Diagnosis not present

## 2017-07-14 ENCOUNTER — Encounter: Payer: Self-pay | Admitting: Neurology

## 2017-07-14 ENCOUNTER — Ambulatory Visit (INDEPENDENT_AMBULATORY_CARE_PROVIDER_SITE_OTHER): Payer: Medicare Other | Admitting: Neurology

## 2017-07-14 VITALS — BP 126/62 | HR 56

## 2017-07-14 DIAGNOSIS — M5431 Sciatica, right side: Secondary | ICD-10-CM | POA: Diagnosis not present

## 2017-07-14 DIAGNOSIS — M4800 Spinal stenosis, site unspecified: Secondary | ICD-10-CM | POA: Diagnosis not present

## 2017-07-14 DIAGNOSIS — G250 Essential tremor: Secondary | ICD-10-CM | POA: Diagnosis not present

## 2017-07-14 DIAGNOSIS — S82842D Displaced bimalleolar fracture of left lower leg, subsequent encounter for closed fracture with routine healing: Secondary | ICD-10-CM | POA: Diagnosis not present

## 2017-07-14 DIAGNOSIS — G629 Polyneuropathy, unspecified: Secondary | ICD-10-CM | POA: Diagnosis not present

## 2017-07-14 DIAGNOSIS — R262 Difficulty in walking, not elsewhere classified: Secondary | ICD-10-CM | POA: Diagnosis not present

## 2017-07-14 DIAGNOSIS — M6281 Muscle weakness (generalized): Secondary | ICD-10-CM | POA: Diagnosis not present

## 2017-07-14 MED ORDER — PRIMIDONE 50 MG PO TABS: 50.0000 mg | ORAL_TABLET | Freq: Two times a day (BID) | ORAL | 1 refills | Status: DC

## 2017-07-17 DIAGNOSIS — S82842D Displaced bimalleolar fracture of left lower leg, subsequent encounter for closed fracture with routine healing: Secondary | ICD-10-CM | POA: Diagnosis not present

## 2017-07-17 DIAGNOSIS — R262 Difficulty in walking, not elsewhere classified: Secondary | ICD-10-CM | POA: Diagnosis not present

## 2017-07-17 DIAGNOSIS — G629 Polyneuropathy, unspecified: Secondary | ICD-10-CM | POA: Diagnosis not present

## 2017-07-17 DIAGNOSIS — M4800 Spinal stenosis, site unspecified: Secondary | ICD-10-CM | POA: Diagnosis not present

## 2017-07-17 DIAGNOSIS — M5431 Sciatica, right side: Secondary | ICD-10-CM | POA: Diagnosis not present

## 2017-07-17 DIAGNOSIS — M6281 Muscle weakness (generalized): Secondary | ICD-10-CM | POA: Diagnosis not present

## 2017-07-18 ENCOUNTER — Ambulatory Visit (INDEPENDENT_AMBULATORY_CARE_PROVIDER_SITE_OTHER): Payer: Medicare Other

## 2017-07-18 ENCOUNTER — Ambulatory Visit (INDEPENDENT_AMBULATORY_CARE_PROVIDER_SITE_OTHER): Payer: Medicare Other | Admitting: Orthopaedic Surgery

## 2017-07-18 ENCOUNTER — Encounter (INDEPENDENT_AMBULATORY_CARE_PROVIDER_SITE_OTHER): Payer: Self-pay | Admitting: Orthopaedic Surgery

## 2017-07-18 DIAGNOSIS — G629 Polyneuropathy, unspecified: Secondary | ICD-10-CM | POA: Diagnosis not present

## 2017-07-18 DIAGNOSIS — M25551 Pain in right hip: Secondary | ICD-10-CM

## 2017-07-18 DIAGNOSIS — R262 Difficulty in walking, not elsewhere classified: Secondary | ICD-10-CM | POA: Diagnosis not present

## 2017-07-18 DIAGNOSIS — M5441 Lumbago with sciatica, right side: Secondary | ICD-10-CM | POA: Diagnosis not present

## 2017-07-18 DIAGNOSIS — M6281 Muscle weakness (generalized): Secondary | ICD-10-CM | POA: Diagnosis not present

## 2017-07-18 DIAGNOSIS — M5431 Sciatica, right side: Secondary | ICD-10-CM | POA: Diagnosis not present

## 2017-07-18 DIAGNOSIS — M4800 Spinal stenosis, site unspecified: Secondary | ICD-10-CM | POA: Diagnosis not present

## 2017-07-18 DIAGNOSIS — S82842D Displaced bimalleolar fracture of left lower leg, subsequent encounter for closed fracture with routine healing: Secondary | ICD-10-CM | POA: Diagnosis not present

## 2017-07-18 NOTE — Progress Notes (Signed)
Office Visit Note   Patient: Dawn [name]           Date of Birth: 07-05-1940           MRN: [id number] Visit Date: 07/18/2017              Requested by: [name], [place], [place] [address] PCP: [name], MD   Assessment & Plan: Visit Diagnoses:  1. Pain in right hip   2. Right-sided low back pain with right-sided sciatica, unspecified chronicity     Plan: Impression is right sided lower back pain versus right hip pathology.  At this point, we will refer the patient to Dr. Ernestina Patches for a diagnostic and hopefully therapeutic right hip intra-articular cortisone injection.  We will also have the skilled nursing facility work with the patient in physical therapy to include gait training and stabilization.  She will follow-up with [place] in 6 weeks time for recheck.  Call with concerns or questions in the meantime.  Follow-Up Instructions: Return in about 6 weeks (around 08/29/2017).   Orders:  Orders Placed This Encounter  Procedures  . XR HIP UNILAT W OR W/O PELVIS 2-3 VIEWS RIGHT  . XR Lumbar Spine 2-3 Views   No orders of the defined types were placed in this encounter.     Procedures: No procedures performed   Clinical Data: No additional findings.   Subjective: Chief Complaint  Patient presents with  . Right Hip - Pain    HPI patient is a pleasant 77 year old female who presents to our clinic today with right leg pain.  This began a few months ago without any known injury or change in activity.  The pain she has is to the right SI joint radiating down the anterolateral aspect of her thigh.  Pain is worse when standing.  She has no pain at rest.  She denies any numbness, tingling or burning.  She does note an ankle fracture on the left several months ago where she was walking with an altered gait for quite some time.  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  She is  also status post posterior approach right bipolar hip hemi-arthroplasty by Dr. Ihor Gully on 01/12/2016.  She has done well with this.  Of note, she lives at [facility] skilled nursing facility  Review of Systems as detailed in HPI.  All others reviewed and are negative.   Objective: Vital Signs: There were no vitals taken for this visit.  Physical Exam well-developed and well-nourished female in no acute distress.  Alert and oriented x3.  Ortho Exam examination of the right lower extremity reveals moderate pain over the right SI joint.  Positive straight leg raise.  Negative logroll.  Minimal pain to the trochanteric bursa.  Specialty Comments:  No specialty comments available.  Imaging: Xr Hip Unilat W Or W/o Pelvis 2-3 Views Right  Result Date: 07/18/2017 Well-seated prosthesis  Xr Lumbar Spine 2-3 Views  Result Date: 07/18/2017 Moderate multilevel spondylosis and degenerative scoliosis    PMFS History: Patient Active Problem List   Diagnosis Date Noted  . Pain in right hip 07/18/2017  . Right-sided low back pain with right-sided sciatica 07/18/2017  . Abnormal chest x-ray 04/04/2017  . Tremor 04/04/2017  . Bimalleolar ankle fracture, left, sequela 10/11/2016  . Altered mental status 10/03/2016  . Neuropathy 10/03/2016  . Chronic diastolic CHF (congestive heart failure) (  [place]) 10/03/2016  . Acute lower UTI   . Recurrent falls 02/02/2016  . 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 Upper GI bleed 01/23/2016  . Blood loss anemia 01/23/2016  . Acute diastolic CHF (congestive heart failure) ([place]) 01/23/2016  . Heme + stool   . Abnormal CT scan, colon   . 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 Hypoxia   . At risk for adverse drug reaction 01/18/2016  . Subcapital fracture of right hip, closed 01/12/2016  . History of depression 12/12/2015  . Alteration consciousness 11/30/2015  . SVT (supraventricular tachycardia) ([place]) 11/27/2015  . Diarrhea 11/27/2015  . Depression,  recurrent ([place]) 11/27/2015  . Polyneuropathy 11/27/2015  . Renal insufficiency   . Protein-calorie malnutrition, severe 11/18/2015  . Intra-abdominal fluid collection   . Premature atrial contractions   . Functional diarrhea   . Sepsis ([place])   . Hyperthyroidism   . Hypotension   . Septic shock ([place])   . Acute encephalopathy   . Hypokalemia   . Acute delirium 11/10/2015  . AKI (acute kidney injury) ([place]) 11/10/2015  . Anemia, iron deficiency 11/10/2015  . Anxiety 11/10/2015  . Chronic pain syndrome 11/10/2015  . Recurrent UTI 11/10/2015  . Acute respiratory failure with hypoxia ([place]) 11/10/2015  . COPD (chronic obstructive pulmonary disease) ([place]) 11/10/2015  . Acute hyperglycemia 11/10/2015  . CKD (chronic kidney disease) stage 3, GFR 30-59 ml/min (HCC) 11/10/2015  . Narrow complex tachycardia ([place]) 11/10/2015  . HTN (hypertension) 11/10/2015  . Generalized weakness 11/10/2015  . Anemia   . Delirium   . Hypoxemia   . Tachycardia    Past Medical History:  Diagnosis Date  . COPD (chronic obstructive pulmonary disease) ([place])   . History of anxiety   . History of depression   . Spinal stenosis   . UTI (urinary tract infection)     Family History  Problem Relation Age of Onset  . Heart attack Mother   . Diabetes Father   . Breast cancer Sister   . Thyroid disease Neg Hx     Past Surgical History:  Procedure Laterality Date  . ANKLE FRACTURE SURGERY Left   . CATARACT EXTRACTION, BILATERAL    . FLEXIBLE SIGMOIDOSCOPY Left 11/18/2015   Procedure: FLEXIBLE SIGMOIDOSCOPY;  Surgeon: [name], MD;  Location: [facility];  Service: Endoscopy;  Laterality: Left;  . FLEXIBLE SIGMOIDOSCOPY N/A 11/20/2015   Procedure: FLEXIBLE SIGMOIDOSCOPY;  Surgeon: [name], MD;  Location: [facility] ENDOSCOPY;  Service: Endoscopy;  Laterality: N/A;  . HIP ARTHROPLASTY Right 01/12/2016   Procedure: ARTHROPLASTY BIPOLAR HIP (HEMIARTHROPLASTY);  Surgeon: [name], MD;  Location: [facility];  Service:  Orthopedics;  Laterality: Right;   Social History   Occupational History  . 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 Smoking status: Former Smoker    Last attempt to quit: 12/11/2015    Years since quitting: 1.6  . Smokeless tobacco: Never Used  . Tobacco comment: Smoked age [age] up to < 1 ppd, usually half a pack per day, mainly one half pack per day  Substance and Sexual Activity  . Alcohol use: No  . Drug use: No  . Sexual activity: Not Currently

## 2017-07-19 DIAGNOSIS — S82842D Displaced bimalleolar fracture of left lower leg, subsequent encounter for closed fracture with routine healing: Secondary | ICD-10-CM | POA: Diagnosis not present

## 2017-07-19 DIAGNOSIS — M4800 Spinal stenosis, site unspecified: Secondary | ICD-10-CM | POA: Diagnosis not present

## 2017-07-19 DIAGNOSIS — G629 Polyneuropathy, unspecified: Secondary | ICD-10-CM | POA: Diagnosis not present

## 2017-07-19 DIAGNOSIS — M6281 Muscle weakness (generalized): Secondary | ICD-10-CM | POA: Diagnosis not present

## 2017-07-19 DIAGNOSIS — M5431 Sciatica, right side: Secondary | ICD-10-CM | POA: Diagnosis not present

## 2017-07-19 DIAGNOSIS — R262 Difficulty in walking, not elsewhere classified: Secondary | ICD-10-CM | POA: Diagnosis not present

## 2017-07-20 DIAGNOSIS — M6281 Muscle weakness (generalized): Secondary | ICD-10-CM | POA: Diagnosis not present

## 2017-07-20 DIAGNOSIS — S82842D Displaced bimalleolar fracture of left lower leg, subsequent encounter for closed fracture with routine healing: Secondary | ICD-10-CM | POA: Diagnosis not present

## 2017-07-20 DIAGNOSIS — M4800 Spinal stenosis, site unspecified: Secondary | ICD-10-CM | POA: Diagnosis not present

## 2017-07-20 DIAGNOSIS — R262 Difficulty in walking, not elsewhere classified: Secondary | ICD-10-CM | POA: Diagnosis not present

## 2017-07-20 DIAGNOSIS — M5431 Sciatica, right side: Secondary | ICD-10-CM | POA: Diagnosis not present

## 2017-07-20 DIAGNOSIS — G629 Polyneuropathy, unspecified: Secondary | ICD-10-CM | POA: Diagnosis not present

## 2017-07-21 DIAGNOSIS — S82842D Displaced bimalleolar fracture of left lower leg, subsequent encounter for closed fracture with routine healing: Secondary | ICD-10-CM | POA: Diagnosis not present

## 2017-07-21 DIAGNOSIS — R262 Difficulty in walking, not elsewhere classified: Secondary | ICD-10-CM | POA: Diagnosis not present

## 2017-07-21 DIAGNOSIS — G629 Polyneuropathy, unspecified: Secondary | ICD-10-CM | POA: Diagnosis not present

## 2017-07-21 DIAGNOSIS — M6281 Muscle weakness (generalized): Secondary | ICD-10-CM | POA: Diagnosis not present

## 2017-07-21 DIAGNOSIS — M4800 Spinal stenosis, site unspecified: Secondary | ICD-10-CM | POA: Diagnosis not present

## 2017-07-21 DIAGNOSIS — M5431 Sciatica, right side: Secondary | ICD-10-CM | POA: Diagnosis not present

## 2017-07-24 DIAGNOSIS — G629 Polyneuropathy, unspecified: Secondary | ICD-10-CM | POA: Diagnosis not present

## 2017-07-24 DIAGNOSIS — M4800 Spinal stenosis, site unspecified: Secondary | ICD-10-CM | POA: Diagnosis not present

## 2017-07-24 DIAGNOSIS — S82842D Displaced bimalleolar fracture of left lower leg, subsequent encounter for closed fracture with routine healing: Secondary | ICD-10-CM | POA: Diagnosis not present

## 2017-07-24 DIAGNOSIS — M5431 Sciatica, right side: Secondary | ICD-10-CM | POA: Diagnosis not present

## 2017-07-24 DIAGNOSIS — M6281 Muscle weakness (generalized): Secondary | ICD-10-CM | POA: Diagnosis not present

## 2017-07-24 DIAGNOSIS — R262 Difficulty in walking, not elsewhere classified: Secondary | ICD-10-CM | POA: Diagnosis not present

## 2017-07-25 DIAGNOSIS — F339 Major depressive disorder, recurrent, unspecified: Secondary | ICD-10-CM | POA: Diagnosis not present

## 2017-07-25 DIAGNOSIS — M4800 Spinal stenosis, site unspecified: Secondary | ICD-10-CM | POA: Diagnosis not present

## 2017-07-25 DIAGNOSIS — M6281 Muscle weakness (generalized): Secondary | ICD-10-CM | POA: Diagnosis not present

## 2017-07-25 DIAGNOSIS — G629 Polyneuropathy, unspecified: Secondary | ICD-10-CM | POA: Diagnosis not present

## 2017-07-25 DIAGNOSIS — M5431 Sciatica, right side: Secondary | ICD-10-CM | POA: Diagnosis not present

## 2017-07-25 DIAGNOSIS — S82842D Displaced bimalleolar fracture of left lower leg, subsequent encounter for closed fracture with routine healing: Secondary | ICD-10-CM | POA: Diagnosis not present

## 2017-07-25 DIAGNOSIS — R262 Difficulty in walking, not elsewhere classified: Secondary | ICD-10-CM | POA: Diagnosis not present

## 2017-07-26 DIAGNOSIS — M6281 Muscle weakness (generalized): Secondary | ICD-10-CM | POA: Diagnosis not present

## 2017-07-26 DIAGNOSIS — M5431 Sciatica, right side: Secondary | ICD-10-CM | POA: Diagnosis not present

## 2017-07-26 DIAGNOSIS — R262 Difficulty in walking, not elsewhere classified: Secondary | ICD-10-CM | POA: Diagnosis not present

## 2017-07-26 DIAGNOSIS — G629 Polyneuropathy, unspecified: Secondary | ICD-10-CM | POA: Diagnosis not present

## 2017-07-26 DIAGNOSIS — S82842D Displaced bimalleolar fracture of left lower leg, subsequent encounter for closed fracture with routine healing: Secondary | ICD-10-CM | POA: Diagnosis not present

## 2017-07-26 DIAGNOSIS — M4800 Spinal stenosis, site unspecified: Secondary | ICD-10-CM | POA: Diagnosis not present

## 2017-07-27 DIAGNOSIS — M6281 Muscle weakness (generalized): Secondary | ICD-10-CM | POA: Diagnosis not present

## 2017-07-27 DIAGNOSIS — S82842D Displaced bimalleolar fracture of left lower leg, subsequent encounter for closed fracture with routine healing: Secondary | ICD-10-CM | POA: Diagnosis not present

## 2017-07-27 DIAGNOSIS — M4800 Spinal stenosis, site unspecified: Secondary | ICD-10-CM | POA: Diagnosis not present

## 2017-07-27 DIAGNOSIS — G629 Polyneuropathy, unspecified: Secondary | ICD-10-CM | POA: Diagnosis not present

## 2017-07-27 DIAGNOSIS — R262 Difficulty in walking, not elsewhere classified: Secondary | ICD-10-CM | POA: Diagnosis not present

## 2017-07-27 DIAGNOSIS — M5431 Sciatica, right side: Secondary | ICD-10-CM | POA: Diagnosis not present

## 2017-07-28 DIAGNOSIS — M4800 Spinal stenosis, site unspecified: Secondary | ICD-10-CM | POA: Diagnosis not present

## 2017-07-28 DIAGNOSIS — M5431 Sciatica, right side: Secondary | ICD-10-CM | POA: Diagnosis not present

## 2017-07-28 DIAGNOSIS — G629 Polyneuropathy, unspecified: Secondary | ICD-10-CM | POA: Diagnosis not present

## 2017-07-28 DIAGNOSIS — S82842D Displaced bimalleolar fracture of left lower leg, subsequent encounter for closed fracture with routine healing: Secondary | ICD-10-CM | POA: Diagnosis not present

## 2017-07-28 DIAGNOSIS — R262 Difficulty in walking, not elsewhere classified: Secondary | ICD-10-CM | POA: Diagnosis not present

## 2017-07-28 DIAGNOSIS — M6281 Muscle weakness (generalized): Secondary | ICD-10-CM | POA: Diagnosis not present

## 2017-07-31 DIAGNOSIS — R262 Difficulty in walking, not elsewhere classified: Secondary | ICD-10-CM | POA: Diagnosis not present

## 2017-07-31 DIAGNOSIS — M4800 Spinal stenosis, site unspecified: Secondary | ICD-10-CM | POA: Diagnosis not present

## 2017-07-31 DIAGNOSIS — S82842D Displaced bimalleolar fracture of left lower leg, subsequent encounter for closed fracture with routine healing: Secondary | ICD-10-CM | POA: Diagnosis not present

## 2017-07-31 DIAGNOSIS — M5431 Sciatica, right side: Secondary | ICD-10-CM | POA: Diagnosis not present

## 2017-07-31 DIAGNOSIS — G629 Polyneuropathy, unspecified: Secondary | ICD-10-CM | POA: Diagnosis not present

## 2017-07-31 DIAGNOSIS — M6281 Muscle weakness (generalized): Secondary | ICD-10-CM | POA: Diagnosis not present

## 2017-08-01 DIAGNOSIS — M4800 Spinal stenosis, site unspecified: Secondary | ICD-10-CM | POA: Diagnosis not present

## 2017-08-01 DIAGNOSIS — M6281 Muscle weakness (generalized): Secondary | ICD-10-CM | POA: Diagnosis not present

## 2017-08-01 DIAGNOSIS — S82842D Displaced bimalleolar fracture of left lower leg, subsequent encounter for closed fracture with routine healing: Secondary | ICD-10-CM | POA: Diagnosis not present

## 2017-08-01 DIAGNOSIS — R262 Difficulty in walking, not elsewhere classified: Secondary | ICD-10-CM | POA: Diagnosis not present

## 2017-08-01 DIAGNOSIS — G629 Polyneuropathy, unspecified: Secondary | ICD-10-CM | POA: Diagnosis not present

## 2017-08-01 DIAGNOSIS — M5431 Sciatica, right side: Secondary | ICD-10-CM | POA: Diagnosis not present

## 2017-08-02 DIAGNOSIS — R262 Difficulty in walking, not elsewhere classified: Secondary | ICD-10-CM | POA: Diagnosis not present

## 2017-08-02 DIAGNOSIS — M4800 Spinal stenosis, site unspecified: Secondary | ICD-10-CM | POA: Diagnosis not present

## 2017-08-02 DIAGNOSIS — M5431 Sciatica, right side: Secondary | ICD-10-CM | POA: Diagnosis not present

## 2017-08-02 DIAGNOSIS — S82842D Displaced bimalleolar fracture of left lower leg, subsequent encounter for closed fracture with routine healing: Secondary | ICD-10-CM | POA: Diagnosis not present

## 2017-08-02 DIAGNOSIS — G629 Polyneuropathy, unspecified: Secondary | ICD-10-CM | POA: Diagnosis not present

## 2017-08-02 DIAGNOSIS — M6281 Muscle weakness (generalized): Secondary | ICD-10-CM | POA: Diagnosis not present

## 2017-08-03 DIAGNOSIS — R262 Difficulty in walking, not elsewhere classified: Secondary | ICD-10-CM | POA: Diagnosis not present

## 2017-08-03 DIAGNOSIS — M6281 Muscle weakness (generalized): Secondary | ICD-10-CM | POA: Diagnosis not present

## 2017-08-03 DIAGNOSIS — G629 Polyneuropathy, unspecified: Secondary | ICD-10-CM | POA: Diagnosis not present

## 2017-08-03 DIAGNOSIS — S82842D Displaced bimalleolar fracture of left lower leg, subsequent encounter for closed fracture with routine healing: Secondary | ICD-10-CM | POA: Diagnosis not present

## 2017-08-03 DIAGNOSIS — M5431 Sciatica, right side: Secondary | ICD-10-CM | POA: Diagnosis not present

## 2017-08-03 DIAGNOSIS — M4800 Spinal stenosis, site unspecified: Secondary | ICD-10-CM | POA: Diagnosis not present

## 2017-08-04 DIAGNOSIS — M4800 Spinal stenosis, site unspecified: Secondary | ICD-10-CM | POA: Diagnosis not present

## 2017-08-04 DIAGNOSIS — M5431 Sciatica, right side: Secondary | ICD-10-CM | POA: Diagnosis not present

## 2017-08-04 DIAGNOSIS — S82842D Displaced bimalleolar fracture of left lower leg, subsequent encounter for closed fracture with routine healing: Secondary | ICD-10-CM | POA: Diagnosis not present

## 2017-08-04 DIAGNOSIS — G629 Polyneuropathy, unspecified: Secondary | ICD-10-CM | POA: Diagnosis not present

## 2017-08-04 DIAGNOSIS — M6281 Muscle weakness (generalized): Secondary | ICD-10-CM | POA: Diagnosis not present

## 2017-08-04 DIAGNOSIS — R262 Difficulty in walking, not elsewhere classified: Secondary | ICD-10-CM | POA: Diagnosis not present

## 2017-08-07 DIAGNOSIS — M4800 Spinal stenosis, site unspecified: Secondary | ICD-10-CM | POA: Diagnosis not present

## 2017-08-07 DIAGNOSIS — M6281 Muscle weakness (generalized): Secondary | ICD-10-CM | POA: Diagnosis not present

## 2017-08-07 DIAGNOSIS — M5431 Sciatica, right side: Secondary | ICD-10-CM | POA: Diagnosis not present

## 2017-08-07 DIAGNOSIS — S82842D Displaced bimalleolar fracture of left lower leg, subsequent encounter for closed fracture with routine healing: Secondary | ICD-10-CM | POA: Diagnosis not present

## 2017-08-07 DIAGNOSIS — G629 Polyneuropathy, unspecified: Secondary | ICD-10-CM | POA: Diagnosis not present

## 2017-08-07 DIAGNOSIS — R262 Difficulty in walking, not elsewhere classified: Secondary | ICD-10-CM | POA: Diagnosis not present

## 2017-08-08 ENCOUNTER — Other Ambulatory Visit: Payer: Self-pay

## 2017-08-08 DIAGNOSIS — R262 Difficulty in walking, not elsewhere classified: Secondary | ICD-10-CM | POA: Diagnosis not present

## 2017-08-08 DIAGNOSIS — M6281 Muscle weakness (generalized): Secondary | ICD-10-CM | POA: Diagnosis not present

## 2017-08-08 DIAGNOSIS — M4800 Spinal stenosis, site unspecified: Secondary | ICD-10-CM | POA: Diagnosis not present

## 2017-08-08 DIAGNOSIS — M5431 Sciatica, right side: Secondary | ICD-10-CM | POA: Diagnosis not present

## 2017-08-08 DIAGNOSIS — S82842D Displaced bimalleolar fracture of left lower leg, subsequent encounter for closed fracture with routine healing: Secondary | ICD-10-CM | POA: Diagnosis not present

## 2017-08-08 DIAGNOSIS — G629 Polyneuropathy, unspecified: Secondary | ICD-10-CM | POA: Diagnosis not present

## 2017-08-08 MED ORDER — HYDROCODONE-ACETAMINOPHEN 5-325 MG PO TABS: 1.0000 | ORAL_TABLET | Freq: Two times a day (BID) | ORAL | 0 refills | Status: DC | PRN

## 2017-08-08 NOTE — Telephone Encounter (Signed)
Durenda Age, NP wrote a hard script and gave to the nurse.

## 2017-08-09 ENCOUNTER — Encounter: Payer: Self-pay | Admitting: Adult Health

## 2017-08-09 ENCOUNTER — Non-Acute Institutional Stay (SKILLED_NURSING_FACILITY): Payer: Medicare Other | Admitting: Adult Health

## 2017-08-09 DIAGNOSIS — G4700 Insomnia, unspecified: Secondary | ICD-10-CM

## 2017-08-09 DIAGNOSIS — I471 Supraventricular tachycardia: Secondary | ICD-10-CM | POA: Diagnosis not present

## 2017-08-09 DIAGNOSIS — F339 Major depressive disorder, recurrent, unspecified: Secondary | ICD-10-CM | POA: Diagnosis not present

## 2017-08-09 DIAGNOSIS — R251 Tremor, unspecified: Secondary | ICD-10-CM | POA: Diagnosis not present

## 2017-08-09 DIAGNOSIS — J309 Allergic rhinitis, unspecified: Secondary | ICD-10-CM | POA: Diagnosis not present

## 2017-08-09 DIAGNOSIS — G629 Polyneuropathy, unspecified: Secondary | ICD-10-CM | POA: Diagnosis not present

## 2017-08-09 DIAGNOSIS — F419 Anxiety disorder, unspecified: Secondary | ICD-10-CM

## 2017-08-09 DIAGNOSIS — J449 Chronic obstructive pulmonary disease, unspecified: Secondary | ICD-10-CM

## 2017-08-09 NOTE — Progress Notes (Signed)
Location:  [facility] Room Number: 376-E Place of Service:  SNF (31) Provider:  Durenda Age, NP  Patient Care Team: [name], MD as PCP - General (Internal Medicine) Medina-Vargas, [name], NP as Nurse Practitioner (Internal Medicine)  Extended Emergency Contact Information Primary Emergency Contact: [place]field,Lisa Address: [address]          [place], [place] [address] [place] Work Phone: [phone] Mobile Phone: [phone] Relation: Daughter  Code Status:  Full Code  Goals of care: Advanced Directive information Advanced Directives 11/08/2016  Does Patient Have a Medical Advance Directive? No  Type of Advance Directive -  Does patient want to make changes to medical advance directive? -  Would patient like information on creating a medical advance directive? No - Patient declined     Chief Complaint  Patient presents with  . Medical Management of Chronic Issues    The patient is seen for a routine [facility] visit    HPI:  Pt is a 77 y.o. female seen today for medical management of chronic diseases.  She is a long-term care resident of [facility].  She has a PMH of COPD, anemia, hyperthyroidism, chronic pain syndrome, and depression. She was recently started on Flonase for allergic rhinitis.   Past Medical History:  Diagnosis Date  . COPD (chronic obstructive pulmonary disease) ([place])   . History of anxiety   . History of depression   . Spinal stenosis   . UTI (urinary tract infection)    Past Surgical History:  Procedure Laterality Date  . ANKLE FRACTURE SURGERY Left   . CATARACT EXTRACTION, BILATERAL    . FLEXIBLE SIGMOIDOSCOPY Left 11/18/2015   Procedure: FLEXIBLE SIGMOIDOSCOPY;  Surgeon: [name], MD;  Location: [facility];  Service: Endoscopy;  Laterality: Left;  . FLEXIBLE SIGMOIDOSCOPY N/A 11/20/2015   Procedure: FLEXIBLE SIGMOIDOSCOPY;  Surgeon: [name], MD;   Location: [facility] ENDOSCOPY;  Service: Endoscopy;  Laterality: N/A;  . HIP ARTHROPLASTY Right 01/12/2016   Procedure: ARTHROPLASTY BIPOLAR HIP (HEMIARTHROPLASTY);  Surgeon: [name], MD;  [facility];  Service: Orthopedics;  Laterality: Right;    No Known Allergies  Outpatient Encounter Medications as of 08/09/2017  Medication Sig  . acetaminophen (TYLENOL) 325 MG tablet Take 650 mg by mouth every 8 (eight) hours as needed.  Marland Kitchen aspirin 81 MG chewable tablet Chew 81 mg by mouth 2 (two) times daily.  . bisacodyl (DULCOLAX) 10 MG suppository Place 10 mg rectally as needed for moderate constipation.  . busPIRone (BUSPAR) 5 MG tablet Take 5 mg by mouth 3 (three) times daily.  . digoxin (LANOXIN) 0.125 MG tablet Take 0.125 mg by mouth daily. Hold if HR <60  . docusate sodium (COLACE) 100 MG capsule Take 1 capsule (100 mg total) by mouth 2 (two) times daily as needed for mild constipation.  Marland Kitchen escitalopram (LEXAPRO) 5 MG tablet Take 15 mg by mouth daily. Give 3 tablets to = 15 mg  . flecainide (TAMBOCOR) 50 MG tablet Take 50 mg by mouth every 12 (twelve) hours.  . fluticasone (FLONASE) 50 MCG/ACT nasal spray Place 2 sprays into both nostrils daily.  Marland Kitchen gabapentin (NEURONTIN) 300 MG capsule Take 1 capsule (300 mg total) by mouth 3 (three) times daily.  Marland Kitchen HYDROcodone-acetaminophen (NORCO/VICODIN) 5-325 MG tablet Take 1 tablet by mouth every 12 (twelve) hours as needed for moderate pain.  . INCRUSE ELLIPTA 62.5 MCG/INH AEPB Inhale 1 puff into the lungs daily.   Marland Kitchen levalbuterol (  XOPENEX) 1.25 MG/3ML nebulizer solution Take 1.25 mg by nebulization every 6 (six) hours as needed for wheezing.   Marland Kitchen loratadine (CLARITIN) 10 MG tablet Take 10 mg by mouth daily.  . magnesium hydroxide (MILK OF MAGNESIA) 400 MG/5ML suspension Take 30 mLs by mouth daily as needed for mild constipation.  . Multiple Vitamins-Minerals (MULTIVITAMIN WITH MINERALS) tablet Take 1 tablet by mouth daily.  . naphazoline-glycerin (CLEAR EYES  REDNESS RELIEF) 0.012-0.2 % SOLN Place 1 drop into both eyes 2 (two) times daily.  Marland Kitchen NUTRITIONAL SUPPLEMENT LIQD Take 120 mLs by mouth 2 (two) times daily. MedPass  . ondansetron (ZOFRAN) 4 MG tablet Take 4 mg by mouth 2 (two) times daily.  . pantoprazole (PROTONIX) 40 MG tablet Take 40 mg by mouth daily.  . primidone (MYSOLINE) 50 MG tablet Take 1 tablet (50 mg total) by mouth 2 (two) times daily.  . traZODone (DESYREL) 50 MG tablet Take 50 mg by mouth at bedtime.    No facility-administered encounter medications on file as of 08/09/2017.     Review of Systems  GENERAL: No change in appetite, no fatigue, no weight changes, no fever, chills or weakness MOUTH and THROAT: Denies oral discomfort, gingival pain or bleeding, pain from teeth or hoarseness   RESPIRATORY: no cough, SOB, DOE, wheezing, hemoptysis CARDIAC: No chest pain, edema or palpitations GI: No abdominal pain, diarrhea, constipation, heart burn, nausea or vomiting GU: Denies dysuria, frequency, hematuria, incontinence, or discharge PSYCHIATRIC: Denies feelings of depression or anxiety. No report of hallucinations, insomnia, paranoia, or agitation    Immunization History  Administered Date(s) Administered  . Influenza, High Dose Seasonal PF 10/04/2016  . Influenza,inj,Quad PF,6+ Mos 01/13/2016  . Pneumococcal-Unspecified 10/11/2011  . Tdap 01/23/2016   Pertinent  Health Maintenance Due  Topic Date Due  . PNA vac Low Risk Adult (2 of 2 - PCV13) 01/10/2018 (Originally 10/10/2012)  . DEXA SCAN  01/11/2028 (Originally 07/06/2004)  . INFLUENZA VACCINE  08/10/2017   Fall Risk  07/14/2017 04/10/2017 11/08/2016 03/01/2016 01/20/2016  Falls in the past year? No Yes Yes Yes Yes  Number falls in past yr: - 2 or more 2 or more 2 or more 1  Injury with Fall? - Yes Yes Yes Yes  Comment - - R hip, L ankle - femoral neck fracture that required surgery  Risk Factor Category  - High Fall Risk - High Fall Risk -  Follow up - Falls evaluation  completed - - -    Body mass index is 22.93 kg/m.   Physical Exam  GENERAL APPEARANCE: Well nourished. In no acute distress. Normal body habitus SKIN:  Skin is warm and dry.  MOUTH and THROAT: Lips are without lesions. Oral mucosa is moist and without lesions. Tongue is normal in shape, size, and color and without lesions RESPIRATORY: Breathing is even & unlabored, BS CTAB CARDIAC: RRR, no murmur,no extra heart sounds, no edema GI: Abdomen soft, normal BS, no masses, no tenderness EXTREMITIES:  Able to move X 4 extremities PSYCHIATRIC: Alert and oriented X 3. Affect and behavior are appropriate   Labs reviewed: Recent Labs    10/06/16 0321 10/06/16 1943 10/07/16 0347 11/04/16 11/24/16 03/10/17  NA 140 141 140 143 140 144  K 4.2 4.0 3.5 4.3 4.7 4.4  CL 102 102 103  --   --   --   CO2 32 31 31  --   --   --   GLUCOSE 90 104* 96  --   --   --  BUN 23* 23* 20 22* 15 14  CREATININE 1.54* 1.36* 1.21* 1.2* 1.3* 1.2*  CALCIUM 8.4* 8.0* 8.1*  --   --   --   MG 2.0  --  1.9  --   --   --   PHOS 3.0  --  2.6  --   --   --    Recent Labs    10/06/16 0321 10/07/16 0347 03/10/17  AST 22 15 12*  ALT 16 12* 8  ALKPHOS 88 88 104  BILITOT 0.7 0.5  --   PROT 6.4* 6.0*  --   ALBUMIN 2.7* 2.8*  --    Recent Labs    10/05/16 0342 10/06/16 0321 10/07/16 0347 11/04/16 03/10/17 04/10/17  WBC 7.3 5.1 5.4 5.1 5.8 5.8  NEUTROABS  --  2.9 3.0 2 3 4   HGB 9.7* 8.8* 9.0* 10.2* 11.0* 11.8*  HCT 32.2* 29.0* 29.0* 31* 32* 36  MCV 100.0 100.3* 99.7  --   --   --   PLT 158 142* 160 161 205 207   Lab Results  Component Value Date   TSH 8.96 (A) 07/12/2017   Lab Results  Component Value Date   HGBA1C 5.2 12/01/2015   Lab Results  Component Value Date   CHOL 107 12/01/2015   HDL 27 (L) 12/01/2015   LDLCALC 59 12/01/2015   TRIG 107 12/01/2015   CHOLHDL 4.0 12/01/2015    Significant Diagnostic Results in last 30 days:  Xr Hip Unilat W Or W/o Pelvis 2-3 Views Right  Result Date:  07/18/2017 Well-seated monopolar hemiarthroplasty without complication  Xr Lumbar Spine 2-3 Views  Result Date: 07/18/2017 Moderate multilevel spondylosis and degenerative scoliosis   Assessment/Plan  1. Allergic rhinitis, unspecified seasonality, unspecified trigger -  Recently started on Flonase 50 mcg instill 2 sprays into each nostril daily, Allergy 10 mg 1 tab daily   2. Paroxysmal junctional tachycardia (HCC) - rate-controlled, continue Digoxin 125 mcg 1 tab daily, Flecainide acetate 50 mg 1 tab Q 12 hours   3. Neuropathy - continue Gabapentin 300 mg 1 capsule TID    4. Depression, recurrent ([place]) - mood is stable, continue Lexapro 5 mg give 3 tabs = 15 mg daily   5. Chronic obstructive pulmonary disease, unspecified COPD type ([place]) - no wheezing nor SOB, continue [place] Ellipta 62.5 mcg INH 1 puff daily, Levalbuterol 1.25 mg/3 ml inhale 1 neb Q 6 hours PRN   6. Anxiety - mood is stable, continue Buspirone 5 mg TID   7. Insomnia, unspecified type - continue Trazodone 50 mg 1 tab Q HS   8. Tremors - stable, continue Primidone 50 mg 1 tab bid     Family/ staff Communication: Discussed plan of care with resident.  Labs/tests ordered:  None  Goals of care:  Long-term care.  Durenda Age, NP [facility] [phone] (Monday-Friday 8:00 a.m. - 5:00 p.m.) [phone] (after hours)\

## 2017-08-15 ENCOUNTER — Encounter (INDEPENDENT_AMBULATORY_CARE_PROVIDER_SITE_OTHER): Payer: Self-pay | Admitting: Physician Assistant

## 2017-08-15 ENCOUNTER — Ambulatory Visit (INDEPENDENT_AMBULATORY_CARE_PROVIDER_SITE_OTHER): Payer: Medicare Other | Admitting: Physician Assistant

## 2017-08-15 DIAGNOSIS — M25551 Pain in right hip: Secondary | ICD-10-CM | POA: Diagnosis not present

## 2017-08-15 DIAGNOSIS — F339 Major depressive disorder, recurrent, unspecified: Secondary | ICD-10-CM | POA: Diagnosis not present

## 2017-08-15 DIAGNOSIS — G4700 Insomnia, unspecified: Secondary | ICD-10-CM | POA: Diagnosis not present

## 2017-08-15 DIAGNOSIS — F419 Anxiety disorder, unspecified: Secondary | ICD-10-CM | POA: Diagnosis not present

## 2017-08-15 NOTE — Addendum Note (Signed)
Addended by: Precious Bard on: 08/15/2017 11:08 AM   Modules accepted: Orders

## 2017-08-15 NOTE — Progress Notes (Signed)
Post-Op Visit Note   Patient: Dawn [name]           Date of Birth: May 18, 1940           MRN: [id number] Visit Date: 08/15/2017 PCP: [name], MD   Assessment & Plan:  Chief Complaint:  Chief Complaint  Patient presents with  . Right Hip - Pain  . Lower Back - Pain   Visit Diagnoses:  1. Pain in right hip     Plan: Patient is a pleasant 77 year old female who presents to our clinic today for follow-up of her right lower extremity pain.  She has not yet had a right hip or SI joint injection.  She has however been working with physical therapy where she resides at [facility].  This is for gait stabilization.  She notes that she is having a hard time with therapy due to the significant pain to the right lower extremity.  She is now starting to get pain on the left side.  At this point, we will refer her to Dr. Ernestina Patches for a right SI joint injection versus right hip intra-articular cortisone injection for diagnostic and possibly therapeutic purposes.  She will follow-up with [place] once that has been completed.  She will call with concerns or questions in the meantime.  Follow-Up Instructions: Return in about 1 month (around 09/12/2017).   Orders:  No orders of the defined types were placed in this encounter.  No orders of the defined types were placed in this encounter.   Imaging: No new imaging  PMFS History: Patient Active Problem List   Diagnosis Date Noted  . Pain in right hip 07/18/2017  . Right-sided low back pain with right-sided sciatica 07/18/2017  . Abnormal chest x-ray 04/04/2017  . Tremor 04/04/2017  . Bimalleolar ankle fracture, left, sequela 10/11/2016  . Altered mental status 10/03/2016  . Neuropathy 10/03/2016  . Chronic diastolic CHF (congestive heart failure) ([place]) 10/03/2016  . Acute lower UTI   . Recurrent falls 02/02/2016  . 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 Upper GI bleed 01/23/2016  . Blood loss anemia 01/23/2016  . Acute  diastolic CHF (congestive heart failure) ([place]) 01/23/2016  . Heme + stool   . Abnormal CT scan, colon   . 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 Hypoxia   . At risk for adverse drug reaction 01/18/2016  . Subcapital fracture of right hip, closed 01/12/2016  . History of depression 12/12/2015  . Alteration consciousness 11/30/2015  . SVT (supraventricular tachycardia) ([place]) 11/27/2015  . Diarrhea 11/27/2015  . Depression, recurrent ([place]) 11/27/2015  . Polyneuropathy 11/27/2015  . Renal insufficiency   . Protein-calorie malnutrition, severe 11/18/2015  . Intra-abdominal fluid collection   . Premature atrial contractions   . Functional diarrhea   . Sepsis ([place])   . Hyperthyroidism   . Hypotension   . Septic shock ([place])   . Acute encephalopathy   . Hypokalemia   . Acute delirium 11/10/2015  . AKI (acute kidney injury) ([place]) 11/10/2015  . Anemia, iron deficiency 11/10/2015  . Anxiety 11/10/2015  . Chronic pain syndrome 11/10/2015  . Recurrent UTI 11/10/2015  . Acute respiratory failure with hypoxia ([place]) 11/10/2015  . COPD (chronic obstructive pulmonary disease) ([place]) 11/10/2015  . Acute hyperglycemia 11/10/2015  . CKD (chronic kidney disease) stage 3, GFR 30-59 ml/min (HCC) 11/10/2015  . Narrow complex tachycardia ([place]) 11/10/2015  . HTN (hypertension) 11/10/2015  . Generalized weakness 11/10/2015  . Anemia   . Delirium   .  Hypoxemia   . Tachycardia    Past Medical History:  Diagnosis Date  . COPD (chronic obstructive pulmonary disease) ([place])   . History of anxiety   . History of depression   . Spinal stenosis   . UTI (urinary tract infection)     Family History  Problem Relation Age of Onset  . Heart attack Mother   . Diabetes Father   . Breast cancer Sister   . Thyroid disease Neg Hx     Past Surgical History:  Procedure Laterality Date  . ANKLE FRACTURE SURGERY Left   . CATARACT EXTRACTION, BILATERAL    . FLEXIBLE SIGMOIDOSCOPY Left 11/18/2015    Procedure: FLEXIBLE SIGMOIDOSCOPY;  Surgeon: [name], MD;  Location: [facility];  Service: Endoscopy;  Laterality: Left;  . FLEXIBLE SIGMOIDOSCOPY N/A 11/20/2015   Procedure: FLEXIBLE SIGMOIDOSCOPY;  Surgeon: [name], MD;  Location[facility];  Service: Endoscopy;  Laterality: N/A;  . HIP ARTHROPLASTY Right 01/12/2016   Procedure: ARTHROPLASTY BIPOLAR HIP (HEMIARTHROPLASTY);  Surgeon: [name], MD;  [facility];  Service: Orthopedics;  Laterality: Right;   Social History   Occupational History  . 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 Smoking status: Former Smoker    Last attempt to quit: 12/11/2015    Years since quitting: 1.6  . Smokeless tobacco: Never Used  . Tobacco comment: Smoked age [age] up to < 1 ppd, usually half a pack per day, mainly one half pack per day  Substance and Sexual Activity  . Alcohol use: No  . Drug use: No  . Sexual activity: Not Currently

## 2017-08-21 ENCOUNTER — Telehealth: Payer: Self-pay | Admitting: Neurology

## 2017-08-21 NOTE — Telephone Encounter (Signed)
-----   Message from Annamaria Helling, [place] sent at 07/14/2017  9:32 AM EDT ----- Call to see how doing on increased Primidone

## 2017-08-21 NOTE — Telephone Encounter (Signed)
Left message on machine for patient to call back.

## 2017-08-22 DIAGNOSIS — F339 Major depressive disorder, recurrent, unspecified: Secondary | ICD-10-CM | POA: Diagnosis not present

## 2017-08-22 DIAGNOSIS — F419 Anxiety disorder, unspecified: Secondary | ICD-10-CM | POA: Diagnosis not present

## 2017-08-22 NOTE — Telephone Encounter (Signed)
Left another message for patient to call back 

## 2017-08-31 ENCOUNTER — Encounter: Payer: Self-pay | Admitting: Internal Medicine

## 2017-08-31 ENCOUNTER — Non-Acute Institutional Stay (SKILLED_NURSING_FACILITY): Payer: Medicare Other | Admitting: Internal Medicine

## 2017-08-31 DIAGNOSIS — R7989 Other specified abnormal findings of blood chemistry: Secondary | ICD-10-CM | POA: Insufficient documentation

## 2017-08-31 DIAGNOSIS — R51 Headache: Secondary | ICD-10-CM | POA: Diagnosis not present

## 2017-08-31 DIAGNOSIS — R296 Repeated falls: Secondary | ICD-10-CM

## 2017-08-31 DIAGNOSIS — R519 Headache, unspecified: Secondary | ICD-10-CM

## 2017-08-31 NOTE — Assessment & Plan Note (Addendum)
L thyroxine 25 mcg micrograms daily with TSH in 4 weeks

## 2017-08-31 NOTE — Assessment & Plan Note (Signed)
She has not fallen since the opioids were weaned to twice daily.  She continues this on a regular basis.

## 2017-08-31 NOTE — Progress Notes (Signed)
NURSING HOME LOCATION:  [facility] ROOM NUMBER:  304-A  CODE STATUS:  Full Code  PCP:  [name], MD  [address] [place] [address]  This is a nursing facility follow up for specific acute issue of headache.  Interim medical record and care since last [place] visit was updated with review of diagnostic studies and change in clinical status since last visit were documented.  HPI: The patient states she has had a headache for 1-2 months intermittently.  It is worse in the morning upon awakening.  She states that she does wear her nasal oxygen at night.  She denies apnea or excessive snoring. Headache can last all day.  It can be over the crown or over the right posterior/superior cervical area.  Biofreeze topically helps the pain in the posterior neck.  She does describe both as "throbbing". She describes chronic decrease in sense of taste. Her audiology exam 6 months ago was normal and she has no other sensory deficits.  She continues to see the neurologist for bilateral upper extremity tremor, diagnosis essential tremor.  Tremor is worse with purposeful activity. Her TSH has been elevated, on 07/12/17 TSH was 8.96.  Review of systems: She has had multiple falls which were exacerbated by polypharmacy, particularly opioids. She has had  multiple fractures sustained in the falls including clavicle, hip, and ankles. She also sustained significant head trauma and laceration 01/23/16. Constitutional: No fever, significant weight change, fatigue  Eyes: No redness, discharge, pain, vision change ENT/mouth: No nasal congestion,  purulent discharge, earache, change in hearing, sore throat  Cardiovascular: No chest pain, palpitations, paroxysmal nocturnal dyspnea, claudication, edema  Musculoskeletal: No  joint swelling, weakness Dermatologic: No rash, pruritus, change in appearance of skin Neurologic: No dizziness, syncope, seizures, numbness,  tingling Hematologic/lymphatic: No significant bruising, lymphadenopathy, abnormal bleeding Allergy/immunology: No itchy/watery eyes, significant sneezing, urticaria, angioedema  Physical exam:  Pertinent or positive findings: Eyebrows are thin.  There is a crease across the malar areas from her oxygen cannula.  Eyebrows are thin.  She has evidence of bilateral cataract extraction.  She has slight bilateral nystagmus with lateral gaze.  She has an upper partial.  She is not wearing the lower plate.  There is splitting of the first heart sound and an accentuation of the second heart sound.  Breath sounds are decreased but chest is surprisingly clear.  She has minor DIP changes mainly of the right hand.  Reflexes are normal in the upper extremities and 0- 1/2+ at the knees.  She exhibits intermittent tremor of the head and both hands.  Dermatologic exam reveals bruising over the forearms in a scattered distribution.  She also has a lipoma over the mid posterior cervical area.  Slight clubbing of the nailbeds suggested.  General appearance: Thin but adequately nourished; no acute distress, increased work of breathing is present.   Lymphatic: No lymphadenopathy about the head, neck, axilla. Eyes: No conjunctival inflammation or lid edema is present. There is no scleral icterus. Ears:  External ear exam shows no significant lesions or deformities.  Tympanic membranes were normal.  Hearing intact to whisper bilaterally. Nose:  External nasal examination shows no deformity or inflammation. Nasal mucosa are pink and moist without lesions, exudates Oral exam:  Lips and gums are healthy appearing. There is no oropharyngeal erythema or exudate. Neck:  No thyromegaly, masses, tenderness noted.    Heart:  Normal rate and regular rhythm without gallop, murmur, click, rub .  Lungs:  without wheezes, rhonchi, rales, rubs. Extremities:  No cyanosis, edema  Neurologic exam : Cranial nerve exam normal Strength equal   in upper & lower extremities Balance, Rhomberg, finger to nose testing could not be completed due to clinical state Skin: Warm & dry w/o tenting. No significant lesions or rash.  See summary under each active problem in the Problem List with associated updated therapeutic plan

## 2017-08-31 NOTE — Assessment & Plan Note (Signed)
Neurology referral if persistent or progressive

## 2017-08-31 NOTE — Patient Instructions (Signed)
See assessment and plan under each diagnosis in the problem list and acutely for this visit 

## 2017-09-01 DIAGNOSIS — F339 Major depressive disorder, recurrent, unspecified: Secondary | ICD-10-CM | POA: Diagnosis not present

## 2017-09-01 DIAGNOSIS — F419 Anxiety disorder, unspecified: Secondary | ICD-10-CM | POA: Diagnosis not present

## 2017-09-05 ENCOUNTER — Encounter: Payer: Self-pay | Admitting: Internal Medicine

## 2017-09-05 ENCOUNTER — Non-Acute Institutional Stay (SKILLED_NURSING_FACILITY): Payer: Medicare Other | Admitting: Internal Medicine

## 2017-09-05 DIAGNOSIS — F339 Major depressive disorder, recurrent, unspecified: Secondary | ICD-10-CM

## 2017-09-05 DIAGNOSIS — E0590 Thyrotoxicosis, unspecified without thyrotoxic crisis or storm: Secondary | ICD-10-CM

## 2017-09-05 DIAGNOSIS — G894 Chronic pain syndrome: Secondary | ICD-10-CM

## 2017-09-05 NOTE — Progress Notes (Signed)
NURSING HOME LOCATION:  [facility] ROOM NUMBER:  304-A  CODE STATUS:  Full Code  PCP:  [name], MD  [place] [address]  This is a nursing facility follow up of chronic medical diagnoses.    Interim medical record and care since last [place] visit was updated with review of diagnostic studies and change in clinical status since last visit were documented.  HPI: She a permanent resident of the SNF with medical diagnoses of chronic pain syndrome, peripheral neuropathy, systolic heart failure, COPD, essential hypertension, and PSVT. Dr Tat has seen her for essential tremor and has a follow-up visit scheduled in January 2020.  She has had partial if any response to primidone; beta-blocker therapy was not an option because of her COPD.   The patient was seen by me on 08/31/2017 for headache.  At that time hypothyroidism was documented and L-thyroxine initiated.  She has a prior history of hyperthyroidism and had been on Tapazole previously from Dr. Dwyane Dee. She has chronic hip pain and is followed by Dr. Erlinda Hong.  She has a follow-up appointment 09/15/2017. She has been seen by the Psychiatric NP for depression; she does not participate in group activities.  The Psychotherapist has met with her & offered stress addressing tools.  Apparently there are major issues involving her daughter and grandson. She is on BuSpar, trazodone and generic Lexapro.  Review of systems: Despite the recurrent major depression, she does not voice significant depressive symptoms today.  She mentions having residual right post auricular headache on occasion.  She also states that her tremor is no better.  She has vague symptoms of having occasional loose stool as well as been occasional constipation.  Does describe insomnia.  Constitutional: No fever, significant weight change  Eyes: No redness, discharge, pain, vision change ENT/mouth: No nasal congestion,  purulent discharge,  earache, change in hearing, sore throat  Cardiovascular: No chest pain, palpitations, paroxysmal nocturnal dyspnea, claudication, edema  Respiratory: No cough, sputum production, hemoptysis Gastrointestinal: No heartburn, dysphagia, abdominal pain, nausea /vomiting, rectal bleeding, melena, change in bowels Genitourinary: No dysuria, hematuria, pyuria, incontinence, nocturia Dermatologic: No rash, pruritus, change in appearance of skin Neurologic: No dizziness, syncope, seizures, numbness, tingling Endocrine: No change in hair/skin/nails, excessive thirst, excessive hunger, excessive urination  Hematologic/lymphatic: No significant bruising, lymphadenopathy, abnormal bleeding Allergy/immunology: No itchy/watery eyes, significant sneezing, urticaria, angioedema  Physical exam:  Pertinent or positive findings: Her affect is flat.  She has an upper partial.  The mandible is edentulous.  Thyroid is small.  Heart rate is slow.  Breath sounds are decreased.  Abdomen is protuberant.  Tremor is greater in the right upper extremity than the left upper extremity.  General appearance: Adequately nourished; no acute distress, increased work of breathing is present.   Lymphatic: No lymphadenopathy about the head, neck, axilla. Eyes: No conjunctival inflammation or lid edema is present. There is no scleral icterus. Ears:  External ear exam shows no significant lesions or deformities.   Nose:  External nasal examination shows no deformity or inflammation. Nasal mucosa are pink and moist without lesions, exudates Oral exam:  Lips and gums are healthy appearing.  Neck:  No thyromegaly, masses, tenderness noted.    Heart:  No gallop, murmur, click, rub .  Lungs:  without wheezes, rhonchi, rales, rubs. Abdomen: Bowel sounds are normal. Abdomen is soft and nontender with no organomegaly, hernias, masses. GU: Deferred  Extremities:  No cyanosis, clubbing, edema  Skin: Warm &  dry w/o tenting. No significant  lesions or rash.  See summary under each active problem in the Problem List with associated updated therapeutic plan

## 2017-09-05 NOTE — Assessment & Plan Note (Signed)
Discuss with Psych NP possibly changing her present psychotropic regimen to include Cymbalta which may also be of benefit in treating chronic pain

## 2017-09-05 NOTE — Patient Instructions (Signed)
See assessment and plan under each diagnosis in the problem list and acutely for this visit 

## 2017-09-05 NOTE — Assessment & Plan Note (Addendum)
09/05/2017 TSH remains in the hypothyroid range off Tapazole.  L-thyroxine has been initiated

## 2017-09-06 DIAGNOSIS — F339 Major depressive disorder, recurrent, unspecified: Secondary | ICD-10-CM | POA: Diagnosis not present

## 2017-09-06 DIAGNOSIS — F419 Anxiety disorder, unspecified: Secondary | ICD-10-CM | POA: Diagnosis not present

## 2017-09-15 ENCOUNTER — Ambulatory Visit (INDEPENDENT_AMBULATORY_CARE_PROVIDER_SITE_OTHER): Payer: Medicare Other | Admitting: Physical Medicine and Rehabilitation

## 2017-09-15 ENCOUNTER — Ambulatory Visit (INDEPENDENT_AMBULATORY_CARE_PROVIDER_SITE_OTHER): Payer: Self-pay

## 2017-09-15 ENCOUNTER — Encounter (INDEPENDENT_AMBULATORY_CARE_PROVIDER_SITE_OTHER): Payer: Self-pay | Admitting: Orthopaedic Surgery

## 2017-09-15 DIAGNOSIS — Z96641 Presence of right artificial hip joint: Secondary | ICD-10-CM

## 2017-09-15 DIAGNOSIS — M25552 Pain in left hip: Secondary | ICD-10-CM

## 2017-09-15 DIAGNOSIS — M461 Sacroiliitis, not elsewhere classified: Secondary | ICD-10-CM | POA: Diagnosis not present

## 2017-09-15 DIAGNOSIS — M545 Low back pain: Secondary | ICD-10-CM

## 2017-09-15 MED ORDER — BUPIVACAINE HCL 0.5 % IJ SOLN
2.0000 mL | INTRAMUSCULAR | Status: AC | PRN
  Administered 2017-09-15: 2 mL via INTRA_ARTICULAR

## 2017-09-15 MED ORDER — METHYLPREDNISOLONE ACETATE 40 MG/ML IJ SUSP
40.0000 mg | INTRAMUSCULAR | Status: AC | PRN
  Administered 2017-09-15: 40 mg via INTRA_ARTICULAR

## 2017-09-15 NOTE — Progress Notes (Signed)
Numeric Pain Rating Scale and Functional Assessment Average Pain 9   In the last MONTH (on 0-10 scale) has pain interfered with the following?  1. General activity like being  able to carry out your everyday physical activities such as walking, climbing stairs, carrying groceries, or moving a chair?  Rating(7)   +Driver, -BT, -Dye Allergies.

## 2017-09-15 NOTE — Patient Instructions (Signed)

## 2017-09-15 NOTE — Progress Notes (Signed)
Lasharn Bufkin Iodice - 77 y.o. female MRN [id number]  Date of birth: [birth year]  Office Visit Note: Visit Date: 09/15/2017 PCP: [name], MD Referred by: [name], MD  Subjective: Chief Complaint  Patient presents with  . Right Hip - Pain   HPI: Mrs. Sekula is a 77 year old female with chronic back pain multiple medical problems.  She has had a prior right hemiarthroplasty.  She is been followed by Dr. Eduard Roux and his assistant Tawanna Cooler, PA-C.  They had requested sacroiliac joint injection versus hip injection recently.  We have been unable to get in touch with the patient but as we had a phone number which was not correct.  She is in skilled nursing.  She came in today for follow-up with Dr. Eduard Roux.  At that point it was decided that we could work her in for sacroiliac joint injection today.  Her pain has been predominantly on the right today she says is left and right and middle.  She does point to the PSIS bilaterally.  She has no pain over the trochanters.  She has no groin pain.  We will complete bilateral sacroiliac joint injections diagnostically and therapeutically.  Depending on relief they should consider possibly MRI of the lumbar spine and may be facet joint blocks.   ROS Otherwise per HPI.  Assessment & Plan: Visit Diagnoses:  1. Low back pain, unspecified back pain laterality, unspecified chronicity, with sciatica presence unspecified   2. Sacroiliitis (HCC)   3. Pain in left hip   4. History of right hip hemiarthroplasty     Plan: No additional findings.   Meds & Orders: No orders of the defined types were placed in this encounter.   Orders Placed This Encounter  Procedures  . XR C-ARM NO REPORT    Follow-up: No follow-ups on file.   Procedures: Sacroiliac Joint Inj on 09/15/2017 12:51 PM Indications: pain and diagnostic evaluation Details: 22 G 3.5 in needle, fluoroscopy-guided posterior approach Medications (Right): 2 mL  bupivacaine 0.5 %; 40 mg methylPREDNISolone acetate 40 MG/ML Medications (Left): 2 mL bupivacaine 0.5 %; 40 mg methylPREDNISolone acetate 40 MG/ML Outcome: tolerated well, no immediate complications  There was excellent flow of contrast producing a partial arthrogram of the sacroiliac joint.  Procedure, treatment alternatives, risks and benefits explained, specific risks discussed. Consent was given by the patient. Immediately prior to procedure a time out was called to verify the correct patient, procedure, equipment, support staff and site/side marked as required. Patient was prepped and draped in the usual sterile fashion.      No notes on file   Clinical History: No specialty comments available.   She reports that she quit smoking about 21 months ago. She has never used smokeless tobacco. No results for input(s): HGBA1C, LABURIC in the last 8760 hours.  Objective:  VS:  HT:    WT:   BMI:     BP:   HR: bpm  TEMP: ( )  RESP:  Physical Exam  Ortho Exam Imaging: Xr C-arm No Report  Result Date: 09/15/2017 Please see Notes tab for imaging impression.   Past Medical/Family/Surgical/Social History: Medications & Allergies reviewed per EMR, new medications updated. Patient Active Problem List   Diagnosis Date Noted  . Headache 08/31/2017  . Elevated TSH 08/31/2017  . Pain in right hip 07/18/2017  . Right-sided low back pain with right-sided sciatica 07/18/2017  . Abnormal chest x-ray 04/04/2017  . Tremor 04/04/2017  . Bimalleolar ankle fracture,  left, sequela 10/11/2016  . Altered mental status 10/03/2016  . Neuropathy 10/03/2016  . Chronic diastolic CHF (congestive heart failure) ([place]) 10/03/2016  . Acute lower UTI   . Recurrent falls 02/02/2016  . 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 Upper GI bleed 01/23/2016  . Blood loss anemia 01/23/2016  . Acute diastolic CHF (congestive heart failure) ([place]) 01/23/2016  . Heme + stool   . Abnormal CT scan, colon   .  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 Hypoxia   . At risk for adverse drug reaction 01/18/2016  . Subcapital fracture of right hip, closed 01/12/2016  . Alteration consciousness 11/30/2015  . SVT (supraventricular tachycardia) ([place]) 11/27/2015  . Diarrhea 11/27/2015  . Depression, recurrent ([place]) 11/27/2015  . Polyneuropathy 11/27/2015  . Renal insufficiency   . Protein-calorie malnutrition, severe 11/18/2015  . Intra-abdominal fluid collection   . Premature atrial contractions   . Functional diarrhea   . Sepsis ([place])   . Hyperthyroidism   . Hypotension   . Septic shock ([place])   . Acute encephalopathy   . Hypokalemia   . Acute delirium 11/10/2015  . AKI (acute kidney injury) ([place]) 11/10/2015  . Anemia, iron deficiency 11/10/2015  . Anxiety 11/10/2015  . Chronic pain syndrome 11/10/2015  . Recurrent UTI 11/10/2015  . Acute respiratory failure with hypoxia ([place]) 11/10/2015  . COPD (chronic obstructive pulmonary disease) ([place]) 11/10/2015  . Acute hyperglycemia 11/10/2015  . CKD (chronic kidney disease) stage 3, GFR 30-59 ml/min (HCC) 11/10/2015  . Narrow complex tachycardia ([place]) 11/10/2015  . HTN (hypertension) 11/10/2015  . Generalized weakness 11/10/2015  . Anemia   . Delirium   . Hypoxemia   . Tachycardia    Past Medical History:  Diagnosis Date  . COPD (chronic obstructive pulmonary disease) ([place])   . History of anxiety   . History of depression   . Spinal stenosis   . UTI (urinary tract infection)    Family History  Problem Relation Age of Onset  . Heart attack Mother   . Diabetes Father   . Breast cancer Sister   . Thyroid disease Neg Hx    Past Surgical History:  Procedure Laterality Date  . ANKLE FRACTURE SURGERY Left   . CATARACT EXTRACTION, BILATERAL    . FLEXIBLE SIGMOIDOSCOPY Left 11/18/2015   Procedure: FLEXIBLE SIGMOIDOSCOPY;  Surgeon: [name], MD;  Location: [facility];  Service: Endoscopy;  Laterality: Left;  . FLEXIBLE SIGMOIDOSCOPY  N/A 11/20/2015   Procedure: FLEXIBLE SIGMOIDOSCOPY;  Surgeon: [name], MD;  Location[facility] ENDOSCOPY;  Service: Endoscopy;  Laterality: N/A;  . HIP ARTHROPLASTY Right 01/12/2016   Procedure: ARTHROPLASTY BIPOLAR HIP (HEMIARTHROPLASTY);  Surgeon: [name], MD;  Location: [facility];  Service: Orthopedics;  Laterality: Right;   Social History   Occupational History  . 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 Smoking status: Former Smoker    Last attempt to quit: 12/11/2015    Years since quitting: 1.7  . Smokeless tobacco: Never Used  . Tobacco comment: Smoked age [age] up to < 1 ppd, usually half a pack per day, mainly one half pack per day  Substance and Sexual Activity  . Alcohol use: No  . Drug use: No  . Sexual activity: Not Currently

## 2017-09-19 DIAGNOSIS — G4700 Insomnia, unspecified: Secondary | ICD-10-CM | POA: Diagnosis not present

## 2017-09-19 DIAGNOSIS — F419 Anxiety disorder, unspecified: Secondary | ICD-10-CM | POA: Diagnosis not present

## 2017-09-19 DIAGNOSIS — F339 Major depressive disorder, recurrent, unspecified: Secondary | ICD-10-CM | POA: Diagnosis not present

## 2017-09-21 DIAGNOSIS — F339 Major depressive disorder, recurrent, unspecified: Secondary | ICD-10-CM | POA: Diagnosis not present

## 2017-09-21 DIAGNOSIS — F419 Anxiety disorder, unspecified: Secondary | ICD-10-CM | POA: Diagnosis not present

## 2017-10-02 ENCOUNTER — Non-Acute Institutional Stay (SKILLED_NURSING_FACILITY): Payer: Medicare Other | Admitting: Adult Health

## 2017-10-02 ENCOUNTER — Encounter: Payer: Self-pay | Admitting: Adult Health

## 2017-10-02 DIAGNOSIS — Z8719 Personal history of other diseases of the digestive system: Secondary | ICD-10-CM

## 2017-10-02 DIAGNOSIS — J309 Allergic rhinitis, unspecified: Secondary | ICD-10-CM

## 2017-10-02 DIAGNOSIS — I5032 Chronic diastolic (congestive) heart failure: Secondary | ICD-10-CM | POA: Diagnosis not present

## 2017-10-02 DIAGNOSIS — M5441 Lumbago with sciatica, right side: Secondary | ICD-10-CM | POA: Diagnosis not present

## 2017-10-02 DIAGNOSIS — E039 Hypothyroidism, unspecified: Secondary | ICD-10-CM

## 2017-10-02 DIAGNOSIS — R251 Tremor, unspecified: Secondary | ICD-10-CM | POA: Diagnosis not present

## 2017-10-02 LAB — TSH: TSH: 5.86 (ref 0.41–5.90)

## 2017-10-02 NOTE — Progress Notes (Signed)
Location:  [place] Room Number: 025-E Place of Service:  SNF (31) Provider:  Durenda Age, NP  Patient Care Team: [name], MD as PCP - General (Internal Medicine) Medina-Vargas, [name], NP as Nurse Practitioner (Internal Medicine)  Extended Emergency Contact Information Primary Emergency Contact: Hatfield,Lisa Address: [address]          [place], [place] [address] [place] Work Phone: [phone] Mobile Phone: [phone] Relation: Daughter  Code Status:  Full Code  Goals of care: Advanced Directive information Advanced Directives 11/08/2016  Does Patient Have a Medical Advance Directive? No  Type of Advance Directive -  Does patient want to make changes to medical advance directive? -  Would patient like information on creating a medical advance directive? No - Patient declined     Chief Complaint  Patient presents with  . Medical Management of Chronic Issues    The patient is seen for a routine [facility] visit    HPI:  Pt is a 77 y.o. female seen today for medical management of chronic diseases.  She is a long-term care resident of [facility].  She has a PMH of COPD, anemia, hyperthyroidism, chronic pain syndrome, and depression. She was seen in the room today. She has chronic pain and had a recent sacroiliac joint injection of Bupivacaine and Methylprednisolone 40 mg (09/15/17). She verbalized being comfortable at this time. She h ad an elevated tsh (8.96) on 7/23 and  tsh re-draw today (10/02/17) was 5.86.   Past Medical History:  Diagnosis Date  . COPD (chronic obstructive pulmonary disease) ([place])   . History of anxiety   . History of depression   . Spinal stenosis   . UTI (urinary tract infection)    Past Surgical History:  Procedure Laterality Date  . ANKLE FRACTURE SURGERY Left   . CATARACT EXTRACTION, BILATERAL    . FLEXIBLE SIGMOIDOSCOPY Left 11/18/2015   Procedure:  FLEXIBLE SIGMOIDOSCOPY;  Surgeon: [name], MD;  Location: [place];  Service: Endoscopy;  Laterality: Left;  . FLEXIBLE SIGMOIDOSCOPY N/A 11/20/2015   Procedure: FLEXIBLE SIGMOIDOSCOPY;  Surgeon: [name], MD;  Location: [facility] ENDOSCOPY;  Service: Endoscopy;  Laterality: N/A;  . HIP ARTHROPLASTY Right 01/12/2016   Procedure: ARTHROPLASTY BIPOLAR HIP (HEMIARTHROPLASTY);  Surgeon: [name], MD;  [facility];  Service: Orthopedics;  Laterality: Right;    No Known Allergies  Outpatient Encounter Medications as of 10/02/2017  Medication Sig  . acetaminophen (TYLENOL) 325 MG tablet Take 650 mg by mouth every 8 (eight) hours as needed.  Marland Kitchen aspirin 81 MG chewable tablet Chew 81 mg by mouth 2 (two) times daily.  . bisacodyl (DULCOLAX) 10 MG suppository Place 10 mg rectally as needed for moderate constipation.  . busPIRone (BUSPAR) 5 MG tablet Take 5 mg by mouth 3 (three) times daily.   . digoxin (LANOXIN) 0.125 MG tablet Take 0.125 mg by mouth daily. Hold if HR <60  . docusate sodium (COLACE) 100 MG capsule Take 1 capsule (100 mg total) by mouth 2 (two) times daily as needed for mild constipation.  Marland Kitchen escitalopram (LEXAPRO) 5 MG tablet Take 15 mg by mouth daily. Give 3 tablets to = 15 mg  . flecainide (TAMBOCOR) 50 MG tablet Take 50 mg by mouth every 12 (twelve) hours.  . fluticasone (FLONASE) 50 MCG/ACT nasal spray Place 2 sprays into both nostrils daily.  Marland Kitchen gabapentin (NEURONTIN) 300 MG capsule Take 1 capsule (300 mg total) by mouth 3 (three)  times daily.  Marland Kitchen HYDROcodone-acetaminophen (NORCO/VICODIN) 5-325 MG tablet Take 1 tablet by mouth every 12 (twelve) hours as needed for moderate pain.  . INCRUSE ELLIPTA 62.5 MCG/INH AEPB Inhale 1 puff into the lungs daily.   Marland Kitchen levalbuterol (XOPENEX) 1.25 MG/3ML nebulizer solution Take 1.25 mg by nebulization every 6 (six) hours as needed for wheezing.   Marland Kitchen levothyroxine (SYNTHROID, LEVOTHROID) 25 MCG tablet Take 25 mcg by mouth daily before breakfast.    . loratadine (CLARITIN) 10 MG tablet Take 10 mg by mouth daily.  . magnesium hydroxide (MILK OF MAGNESIA) 400 MG/5ML suspension Take 30 mLs by mouth daily as needed for mild constipation.  . Multiple Vitamins-Minerals (MULTIVITAMIN WITH MINERALS) tablet Take 1 tablet by mouth daily.  . naphazoline-glycerin (CLEAR EYES REDNESS RELIEF) 0.012-0.2 % SOLN Place 1 drop into both eyes 2 (two) times daily.  Marland Kitchen NUTRITIONAL SUPPLEMENT LIQD Take 120 mLs by mouth 2 (two) times daily. MedPass  . ondansetron (ZOFRAN) 4 MG tablet Take 4 mg by mouth 2 (two) times daily.  . OXYGEN Inhale 2 L into the lungs at bedtime.  . pantoprazole (PROTONIX) 40 MG tablet Take 40 mg by mouth daily.  . primidone (MYSOLINE) 50 MG tablet Take 1 tablet (50 mg total) by mouth 2 (two) times daily.  . traZODone (DESYREL) 50 MG tablet Take 50 mg by mouth at bedtime.   . [DISCONTINUED] Menthol, Topical Analgesic, (BIOFREEZE) 4 % GEL Apply 1 application topically 3 (three) times daily.   No facility-administered encounter medications on file as of 10/02/2017.     Review of Systems  GENERAL: No change in appetite, no fatigue, no weight changes, no fever, chills or weakness MOUTH and THROAT: Denies oral discomfort, ginSKIN: Denies rash, itching, wounds, ulcer sores, or nail abnormalities RESPIRATORY: no cough, SOB, DOE, wheezing, hemoptysis CARDIAC: No chest pain, edema or palpitations GI: No abdominal pain, diarrhea, constipation, heart burn, nausea or vomiting GU: Denies dysuria, frequency, hematuria, incontinence, or discharge PSYCHIATRIC: Denies feelings of depression or anxiety. No report of hallucinations, insomnia, paranoia, or agitation   Immunization History  Administered Date(s) Administered  . Influenza, High Dose Seasonal PF 10/04/2016  . Influenza,inj,Quad PF,6+ Mos 01/13/2016  . Pneumococcal-Unspecified 10/11/2011  . Tdap 01/23/2016   Pertinent  Health Maintenance Due  Topic Date Due  . INFLUENZA VACCINE   08/10/2017  . PNA vac Low Risk Adult (2 of 2 - PCV13) 01/10/2018 (Originally 10/10/2012)  . DEXA SCAN  01/11/2028 (Originally 07/06/2004)   Fall Risk  07/14/2017 04/10/2017 11/08/2016 03/01/2016 01/20/2016  Falls in the past year? No Yes Yes Yes Yes  Number falls in past yr: - 2 or more 2 or more 2 or more 1  Injury with Fall? - Yes Yes Yes Yes  Comment - - R hip, L ankle - femoral neck fracture that required surgery  Risk Factor Category  - High Fall Risk - High Fall Risk -  Follow up - Falls evaluation completed - - -      Vitals:   10/02/17 0939  BP: 101/61  Pulse: 63  Resp: 18  Temp: (!) 97 F (36.1 C)  TempSrc: Oral  SpO2: 95%  Weight: 149 lb (67.6 kg)  Height: 5\' 8"  (1.727 m)   Body mass index is 22.66 kg/m.  Physical Exam  GENERAL APPEARANCE: Well nourished. In no acute distress. Normal body habitus SKIN:  Skin is warm and dry.  MOUTH and THROAT: Lips are without lesions. Oral mucosa is moist and without lesions. Tongue is normal  in shape, size, and color and without lesions RESPIRATORY: Breathing is even & unlabored, BS CTAB CARDIAC: RRR, no murmur,no extra heart sounds, no edema GI: Abdomen soft, normal BS, no masses, no tenderness EXTREMITIES:  No edema , no cyanosis NEUROLOGICAL: Speech is clear PSYCHIATRIC: Alert and oriented X 3. Affect and behavior are appropriate   Labs reviewed: 10/02/17  tsh 5.86 Recent Labs    10/06/16 0321 10/06/16 1943 10/07/16 0347 11/04/16 11/24/16 03/10/17  NA 140 141 140 143 140 144  K 4.2 4.0 3.5 4.3 4.7 4.4  CL 102 102 103  --   --   --   CO2 32 31 31  --   --   --   GLUCOSE 90 104* 96  --   --   --   BUN 23* 23* 20 22* 15 14  CREATININE 1.54* 1.36* 1.21* 1.2* 1.3* 1.2*  CALCIUM 8.4* 8.0* 8.1*  --   --   --   MG 2.0  --  1.9  --   --   --   PHOS 3.0  --  2.6  --   --   --    Recent Labs    10/06/16 0321 10/07/16 0347 03/10/17  AST 22 15 12*  ALT 16 12* 8  ALKPHOS 88 88 104  BILITOT 0.7 0.5  --   PROT 6.4* 6.0*  --     ALBUMIN 2.7* 2.8*  --    Recent Labs    10/05/16 0342 10/06/16 0321 10/07/16 0347 11/04/16 03/10/17 04/10/17  WBC 7.3 5.1 5.4 5.1 5.8 5.8  NEUTROABS  --  2.9 3.0 2 3 4   HGB 9.7* 8.8* 9.0* 10.2* 11.0* 11.8*  HCT 32.2* 29.0* 29.0* 31* 32* 36  MCV 100.0 100.3* 99.7  --   --   --   PLT 158 142* 160 161 205 207   Lab Results  Component Value Date   TSH 8.96 (A) 07/12/2017   Lab Results  Component Value Date   HGBA1C 5.2 12/01/2015   Lab Results  Component Value Date   CHOL 107 12/01/2015   HDL 27 (L) 12/01/2015   LDLCALC 59 12/01/2015   TRIG 107 12/01/2015   CHOLHDL 4.0 12/01/2015    Significant Diagnostic Results in last 30 days:  Xr C-arm No Report  Result Date: 09/15/2017 Please see Notes tab for imaging impression.   Assessment/Plan  1. Hypothyroidism - repeat tsh today 5.86, continue levothyroxine 25 g 1 tab daily Lab Results  Component Value Date   TSH 8.96 (A) 07/12/2017   T3TOTAL 81 11/11/2015     2. Right-sided low back pain with right-sided sciatica, unspecified chronicity -  had a recent sacroiliac joint injection of Bupivacaine and Methylprednisolone 40 mg (09/15/17), continue acetaminophen 325 mg give  2 tabs = 650 mg every 8 hours when necessary, norco 5-325 mg1 tab every 12 hours when necessary, gabapentin 300 mg 1 capsule 3 times a day   3. Chronic diastolic CHF (congestive heart failure) (HCC) - no SOB,    4. Tremor - stable, continue primidone 50 mg 1 tablet twice a day   5. History of GI bleed - no reported bleeding, continue pantoprazole 40 mg 1 tab every 6 a.m.   6. Allergic rhinitis, unspecified seasonality, unspecified trigger - table,continue fluticasone 50 g administer 2 sprays in each nostril daily and loratadine 10 mg 1 tab daily    Family/ staff Communication: Discussed plan of care with resident.  Labs/tests ordered:  None  Goals of care:   Long-term care.   Durenda Age, NP [facility] [phone] (Monday-Friday 8:00 a.m. - 5:00 p.m.) [phone] (after hours)

## 2017-10-06 DIAGNOSIS — M542 Cervicalgia: Secondary | ICD-10-CM | POA: Diagnosis not present

## 2017-10-06 DIAGNOSIS — M4802 Spinal stenosis, cervical region: Secondary | ICD-10-CM | POA: Diagnosis not present

## 2017-10-09 DIAGNOSIS — M542 Cervicalgia: Secondary | ICD-10-CM | POA: Diagnosis not present

## 2017-10-09 DIAGNOSIS — M4802 Spinal stenosis, cervical region: Secondary | ICD-10-CM | POA: Diagnosis not present

## 2017-10-10 DIAGNOSIS — M542 Cervicalgia: Secondary | ICD-10-CM | POA: Diagnosis not present

## 2017-10-10 DIAGNOSIS — M4802 Spinal stenosis, cervical region: Secondary | ICD-10-CM | POA: Diagnosis not present

## 2017-10-11 DIAGNOSIS — M4802 Spinal stenosis, cervical region: Secondary | ICD-10-CM | POA: Diagnosis not present

## 2017-10-11 DIAGNOSIS — M542 Cervicalgia: Secondary | ICD-10-CM | POA: Diagnosis not present

## 2017-10-12 DIAGNOSIS — M4802 Spinal stenosis, cervical region: Secondary | ICD-10-CM | POA: Diagnosis not present

## 2017-10-12 DIAGNOSIS — M542 Cervicalgia: Secondary | ICD-10-CM | POA: Diagnosis not present

## 2017-10-13 ENCOUNTER — Ambulatory Visit (INDEPENDENT_AMBULATORY_CARE_PROVIDER_SITE_OTHER): Payer: Medicare Other | Admitting: Orthopaedic Surgery

## 2017-10-13 ENCOUNTER — Ambulatory Visit (INDEPENDENT_AMBULATORY_CARE_PROVIDER_SITE_OTHER): Payer: Self-pay

## 2017-10-13 ENCOUNTER — Encounter (INDEPENDENT_AMBULATORY_CARE_PROVIDER_SITE_OTHER): Payer: Self-pay | Admitting: Orthopaedic Surgery

## 2017-10-13 VITALS — Ht 68.0 in | Wt 149.0 lb

## 2017-10-13 DIAGNOSIS — M5416 Radiculopathy, lumbar region: Secondary | ICD-10-CM

## 2017-10-13 DIAGNOSIS — M542 Cervicalgia: Secondary | ICD-10-CM | POA: Diagnosis not present

## 2017-10-13 DIAGNOSIS — M4802 Spinal stenosis, cervical region: Secondary | ICD-10-CM | POA: Diagnosis not present

## 2017-10-13 NOTE — Progress Notes (Signed)
Patient: Dawn [name]           Date of Birth: 01/24/1940           MRN: [id number] Visit Date: 10/13/2017 PCP: [name], MD   Assessment & Plan:  Chief Complaint:  Chief Complaint  Patient presents with  . Right Hip - Pain   Visit Diagnoses:  1. Lumbar radiculopathy     Plan: Patient is a pleasant 77 year old female who resides at a skilled nursing facility and presents today for follow-up of bilateral SI joint injections by Dr. Ernestina Patches.  The patient admits to significant relief with the anesthetic phase of the injection, but nothing more.  Her pain has returned and has dramatically worsened.  She notes very little weakness to either lower extremity, but just complains of pain.  She does have a long-standing history of epidural steroid injections in the past which at one point seem to dramatically help.  No recent MRI of the lumbar spine.  At this point, we will obtain a new MRI of the lumbar spine.  She will follow-up with Dr. Ernestina Patches following the MRI to discuss results and possibly inject the facet joints.  Call with concerns or questions in the meantime.  Follow-Up Instructions: Return for with dr. Ernestina Patches to discuss mri and ?injection.   Orders:  Orders Placed This Encounter  Procedures  . XR HIP UNILAT W OR W/O PELVIS 2-3 VIEWS RIGHT  . MR Lumbar Spine w/o contrast   No orders of the defined types were placed in this encounter.   Imaging: No new imaging  PMFS History: Patient Active Problem List   Diagnosis Date Noted  . Headache 08/31/2017  . Elevated TSH 08/31/2017  . Pain in right hip 07/18/2017  . Right-sided low back pain with right-sided sciatica 07/18/2017  . Abnormal chest x-ray 04/04/2017  . Tremor 04/04/2017  . Bimalleolar ankle fracture, left, sequela 10/11/2016  . Altered mental status 10/03/2016  . Neuropathy 10/03/2016  . Chronic diastolic CHF (congestive heart failure) ([place]) 10/03/2016  . Acute lower UTI   . Recurrent falls  02/02/2016  . 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 Upper GI bleed 01/23/2016  . Blood loss anemia 01/23/2016  . Acute diastolic CHF (congestive heart failure) ([place]) 01/23/2016  . Heme + stool   . Abnormal CT scan, colon   . 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 Hypoxia   . At risk for adverse drug reaction 01/18/2016  . Subcapital fracture of right hip, closed 01/12/2016  . Alteration consciousness 11/30/2015  . SVT (supraventricular tachycardia) ([place]) 11/27/2015  . Diarrhea 11/27/2015  . Depression, recurrent ([place]) 11/27/2015  . Polyneuropathy 11/27/2015  . Renal insufficiency   . Protein-calorie malnutrition, severe 11/18/2015  . Intra-abdominal fluid collection   . Premature atrial contractions   . Functional diarrhea   . Sepsis ([place])   . Hyperthyroidism   . Hypotension   . Septic shock ([place])   . Acute encephalopathy   . Hypokalemia   . Acute delirium 11/10/2015  . AKI (acute kidney injury) ([place]) 11/10/2015  . Anemia, iron deficiency 11/10/2015  . Anxiety 11/10/2015  . Chronic pain syndrome 11/10/2015  . Recurrent UTI 11/10/2015  . Acute respiratory failure with hypoxia ([place]) 11/10/2015  . COPD (chronic obstructive pulmonary disease) ([place]) 11/10/2015  . Acute hyperglycemia 11/10/2015  . CKD (chronic kidney disease) stage 3, GFR 30-59 ml/min (HCC) 11/10/2015  . Narrow complex tachycardia ([place]) 11/10/2015  . HTN (hypertension) 11/10/2015  . Generalized  weakness 11/10/2015  . Anemia   . Delirium   . Hypoxemia   . Tachycardia    Past Medical History:  Diagnosis Date  . COPD (chronic obstructive pulmonary disease) ([place])   . History of anxiety   . History of depression   . Spinal stenosis   . UTI (urinary tract infection)     Family History  Problem Relation Age of Onset  . Heart attack Mother   . Diabetes Father   . Breast cancer Sister   . Thyroid disease Neg Hx     Past Surgical History:  Procedure Laterality Date  . ANKLE FRACTURE SURGERY  Left   . CATARACT EXTRACTION, BILATERAL    . FLEXIBLE SIGMOIDOSCOPY Left 11/18/2015   Procedure: FLEXIBLE SIGMOIDOSCOPY;  Surgeon: [name], MD;  Location: [place];  Service: Endoscopy;  Laterality: Left;  . FLEXIBLE SIGMOIDOSCOPY N/A 11/20/2015   Procedure: FLEXIBLE SIGMOIDOSCOPY;  Surgeon: [name], MD;  Location: [facility] ENDOSCOPY;  Service: Endoscopy;  Laterality: N/A;  . HIP ARTHROPLASTY Right 01/12/2016   Procedure: ARTHROPLASTY BIPOLAR HIP (HEMIARTHROPLASTY);  Surgeon: [name], MD;  [facility];  Service: Orthopedics;  Laterality: Right;   Social History   Occupational History  . 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 Smoking status: Former Smoker    Last attempt to quit: 12/11/2015    Years since quitting: 1.8  . Smokeless tobacco: Never Used  . Tobacco comment: Smoked age [age] up to < 1 ppd, usually half a pack per day, mainly one half pack per day  Substance and Sexual Activity  . Alcohol use: No  . Drug use: No  . Sexual activity: Not Currently

## 2017-10-16 ENCOUNTER — Other Ambulatory Visit: Payer: Self-pay

## 2017-10-16 DIAGNOSIS — M4802 Spinal stenosis, cervical region: Secondary | ICD-10-CM | POA: Diagnosis not present

## 2017-10-16 DIAGNOSIS — M542 Cervicalgia: Secondary | ICD-10-CM | POA: Diagnosis not present

## 2017-10-16 MED ORDER — HYDROCODONE-ACETAMINOPHEN 5-325 MG PO TABS: 1.0000 | ORAL_TABLET | Freq: Two times a day (BID) | ORAL | 0 refills | Status: DC | PRN

## 2017-10-17 DIAGNOSIS — M542 Cervicalgia: Secondary | ICD-10-CM | POA: Diagnosis not present

## 2017-10-17 DIAGNOSIS — M4802 Spinal stenosis, cervical region: Secondary | ICD-10-CM | POA: Diagnosis not present

## 2017-10-18 DIAGNOSIS — M542 Cervicalgia: Secondary | ICD-10-CM | POA: Diagnosis not present

## 2017-10-18 DIAGNOSIS — M4802 Spinal stenosis, cervical region: Secondary | ICD-10-CM | POA: Diagnosis not present

## 2017-10-19 DIAGNOSIS — M4802 Spinal stenosis, cervical region: Secondary | ICD-10-CM | POA: Diagnosis not present

## 2017-10-19 DIAGNOSIS — M542 Cervicalgia: Secondary | ICD-10-CM | POA: Diagnosis not present

## 2017-10-20 DIAGNOSIS — F339 Major depressive disorder, recurrent, unspecified: Secondary | ICD-10-CM | POA: Diagnosis not present

## 2017-10-20 DIAGNOSIS — G4700 Insomnia, unspecified: Secondary | ICD-10-CM | POA: Diagnosis not present

## 2017-10-20 DIAGNOSIS — F419 Anxiety disorder, unspecified: Secondary | ICD-10-CM | POA: Diagnosis not present

## 2017-11-03 ENCOUNTER — Encounter: Payer: Self-pay | Admitting: Adult Health

## 2017-11-03 ENCOUNTER — Non-Acute Institutional Stay (SKILLED_NURSING_FACILITY): Payer: Medicare Other | Admitting: Adult Health

## 2017-11-03 DIAGNOSIS — E039 Hypothyroidism, unspecified: Secondary | ICD-10-CM | POA: Diagnosis not present

## 2017-11-03 DIAGNOSIS — R251 Tremor, unspecified: Secondary | ICD-10-CM | POA: Diagnosis not present

## 2017-11-03 DIAGNOSIS — M5416 Radiculopathy, lumbar region: Secondary | ICD-10-CM | POA: Diagnosis not present

## 2017-11-03 DIAGNOSIS — G4700 Insomnia, unspecified: Secondary | ICD-10-CM

## 2017-11-03 DIAGNOSIS — I471 Supraventricular tachycardia: Secondary | ICD-10-CM

## 2017-11-03 NOTE — Progress Notes (Signed)
Location:  [place] Room Number: 326-Z Place of Service:  SNF (31) Provider:  Durenda Age, NP  Patient Care Team: [name], MD as PCP - General (Internal Medicine) Medina-Vargas, [name], NP as Nurse Practitioner (Internal Medicine)  Extended Emergency Contact Information Primary Emergency Contact: Hatfield,Lisa Address: [address]          [place], [place] [address] [place] Work Phone: [phone] Mobile Phone: [phone] Relation: Daughter  Code Status:  Full Code  Goals of care: Advanced Directive information Advanced Directives 11/08/2016  Does Patient Have a Medical Advance Directive? No  Type of Advance Directive -  Does patient want to make changes to medical advance directive? -  Would patient like information on creating a medical advance directive? No - Patient declined     Chief Complaint  Patient presents with  . Medical Management of Chronic Issues    Routine [facility] visit    HPI:  Pt is a 77 y.o. female seen today for medical management of chronic diseases.  She is a long-term care resident of [facility].  She has a PMH of COPD, anemia, hyperthyroidism, chronic pain syndrome, and depression. She was seen in the room today. She was asking regarding her MR of lumbar spine and X-ray of hip due to lower back and neck pain.   Past Medical History:  Diagnosis Date  . COPD (chronic obstructive pulmonary disease) ([place])   . History of anxiety   . History of depression   . Spinal stenosis   . UTI (urinary tract infection)    Past Surgical History:  Procedure Laterality Date  . ANKLE FRACTURE SURGERY Left   . CATARACT EXTRACTION, BILATERAL    . FLEXIBLE SIGMOIDOSCOPY Left 11/18/2015   Procedure: FLEXIBLE SIGMOIDOSCOPY;  Surgeon: [name], MD;  Location: [place];  Service: Endoscopy;  Laterality: Left;  . FLEXIBLE SIGMOIDOSCOPY N/A 11/20/2015   Procedure: FLEXIBLE  SIGMOIDOSCOPY;  Surgeon: [name], MD;  Location[facility];  Service: Endoscopy;  Laterality: N/A;  . HIP ARTHROPLASTY Right 01/12/2016   Procedure: ARTHROPLASTY BIPOLAR HIP (HEMIARTHROPLASTY);  Surgeon: [name], MD;  [facility];  Service: Orthopedics;  Laterality: Right;    Allergies  Allergen Reactions  . Biofreeze [Menthol (Topical Analgesic)] Rash    By history Aspercreme does not cause a rash    Outpatient Encounter Medications as of 11/03/2017  Medication Sig  . acetaminophen (TYLENOL) 325 MG tablet Take 650 mg by mouth every 8 (eight) hours as needed.  Marland Kitchen aspirin 81 MG chewable tablet Chew 81 mg by mouth 2 (two) times daily.  . bisacodyl (DULCOLAX) 10 MG suppository Place 10 mg rectally as needed for moderate constipation.  . busPIRone (BUSPAR) 5 MG tablet Take 5 mg by mouth 3 (three) times daily.   . digoxin (LANOXIN) 0.125 MG tablet Take 0.125 mg by mouth daily. Hold if HR <60  . docusate sodium (COLACE) 100 MG capsule Take 1 capsule (100 mg total) by mouth 2 (two) times daily as needed for mild constipation.  Marland Kitchen escitalopram (LEXAPRO) 5 MG tablet Take 15 mg by mouth daily. Give 3 tablets to = 15 mg  . flecainide (TAMBOCOR) 50 MG tablet Take 50 mg by mouth every 12 (twelve) hours.  . fluticasone (FLONASE) 50 MCG/ACT nasal spray Place 2 sprays into both nostrils daily.  Marland Kitchen gabapentin (NEURONTIN) 300 MG capsule Take 1 capsule (300 mg total) by mouth 3 (three) times daily.  Marland Kitchen guaiFENesin (MUCINEX) 600  MG 12 hr tablet Take 600 mg by mouth 2 (two) times daily.  Marland Kitchen HYDROcodone-acetaminophen (NORCO/VICODIN) 5-325 MG tablet Take 1 tablet by mouth every 12 (twelve) hours as needed for moderate pain.  . INCRUSE ELLIPTA 62.5 MCG/INH AEPB Inhale 1 puff into the lungs daily.   Marland Kitchen levalbuterol (XOPENEX) 1.25 MG/3ML nebulizer solution Take 1.25 mg by nebulization every 6 (six) hours as needed for wheezing.   Marland Kitchen levothyroxine (SYNTHROID, LEVOTHROID) 25 MCG tablet Take 25 mcg by mouth  daily before breakfast.  . loratadine (CLARITIN) 10 MG tablet Take 10 mg by mouth daily.  . magnesium hydroxide (MILK OF MAGNESIA) 400 MG/5ML suspension Take 30 mLs by mouth daily as needed for mild constipation.  . Multiple Vitamins-Minerals (MULTIVITAMIN WITH MINERALS) tablet Take 1 tablet by mouth daily.  . naphazoline-glycerin (CLEAR EYES REDNESS RELIEF) 0.012-0.2 % SOLN Place 1 drop into both eyes 2 (two) times daily.  Marland Kitchen NUTRITIONAL SUPPLEMENT LIQD Take 120 mLs by mouth 2 (two) times daily. MedPass  . ondansetron (ZOFRAN) 4 MG tablet Take 4 mg by mouth 2 (two) times daily.  . OXYGEN Inhale 2 L into the lungs at bedtime.  . pantoprazole (PROTONIX) 40 MG tablet Take 40 mg by mouth daily.  . primidone (MYSOLINE) 50 MG tablet Take 1 tablet (50 mg total) by mouth 2 (two) times daily.  . traZODone (DESYREL) 50 MG tablet Take 50 mg by mouth at bedtime.   Loura Pardon Salicylate (ASPERCREME EX) Apply 1 application topically 2 (two) times daily.   No facility-administered encounter medications on file as of 11/03/2017.     Review of Systems  GENERAL: No change in appetite, no fatigue, no weight changes, no fever, chills or weakness MOUTH and THROAT: Denies oral discomfort, gingival pain or bleeding RESPIRATORY: no cough, SOB, DOE, wheezing, hemoptysis CARDIAC: No chest pain, edema or palpitations GI: No abdominal pain, diarrhea, constipation, heart burn, nausea or vomiting GU: Denies dysuria, frequency, hematuria, incontinence, or discharge PSYCHIATRIC: Denies feelings of depression or anxiety. No report of hallucinations, insomnia, paranoia, or agitation   Immunization History  Administered Date(s) Administered  . Influenza, High Dose Seasonal PF 10/04/2016  . Influenza,inj,Quad PF,6+ Mos 01/13/2016  . Pneumococcal-Unspecified 10/11/2011  . Tdap 01/23/2016   Pertinent  Health Maintenance Due  Topic Date Due  . INFLUENZA VACCINE  08/10/2017  . PNA vac Low Risk Adult (2 of 2 - PCV13)  01/10/2018 (Originally 10/10/2012)  . DEXA SCAN  01/11/2028 (Originally 07/06/2004)   Fall Risk  07/14/2017 04/10/2017 11/08/2016 03/01/2016 01/20/2016  Falls in the past year? No Yes Yes Yes Yes  Number falls in past yr: - 2 or more 2 or more 2 or more 1  Injury with Fall? - Yes Yes Yes Yes  Comment - - R hip, L ankle - femoral neck fracture that required surgery  Risk Factor Category  - High Fall Risk - High Fall Risk -  Follow up - Falls evaluation completed - - -      Vitals:   11/03/17 1119  BP: 117/76  Pulse: 74  Resp: 18  Temp: 97.7 F (36.5 C)  TempSrc: Oral  SpO2: 95%  Weight: 145 lb 12.8 oz (66.1 kg)  Height: 5\' 8"  (1.727 m)   Body mass index is 22.17 kg/m.  Physical Exam  GENERAL APPEARANCE: Well nourished. In no acute distress. Normal body habitus SKIN:  Skin is warm and dry. MOUTH and THROAT: Lips are without lesions. Oral mucosa is moist and without lesions. Tongue is normal  in shape, size, and color and without lesions RESPIRATORY: Breathing is even & unlabored, BS CTAB CARDIAC: RRR, no murmur,no extra heart sounds, no edema GI: Abdomen soft, normal BS, no masses, no tenderness EXTREMITIES:  Able to move X 4 extremities NEUROLOGICAL: There is no tremor. Speech is clear PSYCHIATRIC: Alert and oriented X 3. Affect and behavior are appropriate   Labs reviewed: Recent Labs    11/04/16 11/24/16 03/10/17  NA 143 140 144  K 4.3 4.7 4.4  BUN 22* 15 14  CREATININE 1.2* 1.3* 1.2*   Recent Labs    03/10/17  AST 12*  ALT 8  ALKPHOS 104   Recent Labs    11/04/16 03/10/17 04/10/17  WBC 5.1 5.8 5.8  NEUTROABS 2 3 4   HGB 10.2* 11.0* 11.8*  HCT 31* 32* 36  PLT 161 205 207   Lab Results  Component Value Date   TSH 5.86 10/02/2017   Lab Results  Component Value Date   HGBA1C 5.2 12/01/2015   Lab Results  Component Value Date   CHOL 107 12/01/2015   HDL 27 (L) 12/01/2015   LDLCALC 59 12/01/2015   TRIG 107 12/01/2015   CHOLHDL 4.0 12/01/2015     Assessment/Plan  1. Lumbar radiculopathy -  Consulted with orthopedics on 10/13/17, pre-authorization on-going for MR lumbar without contrast and x-ray hip unilateral, continue acetaminophen 325 mg 2 tabs = 650 mg every 8 hours as needed and Norco 5-325 mg 1 tab every 12 hours as needed for pain, gabapentin 300 mg 1 capsule 3 times a day   2. SVT (supraventricular tachycardia) (HCC) -rate controlled, continue flecainide 50 mg 1 tab every 12 hours, aspirin 81 mg 1 tab twice a day, digoxin 125 mcg 1 tab daily   3. Tremor -no tremors noted today, continue primidone 50 mg 1 tab twice a day   4. Hypothyroidism, unspecified type -continue levothyroxine 25 mcg 1 tab daily Lab Results  Component Value Date   TSH 5.86 10/02/2017     5. Insomnia, unspecified type -continue trazodone 50 mg 1 tab nightly    Family/ staff Communication:   Discussed plan of care with resident.  Labs/tests ordered:   None  Goals of care:   Long-term care.   Durenda Age, NP [facility] and [facility] [phone] (Monday-Friday 8:00 a.m. - 5:00 p.m.) [phone] (after hours)

## 2017-11-06 DIAGNOSIS — E039 Hypothyroidism, unspecified: Secondary | ICD-10-CM | POA: Diagnosis not present

## 2017-11-06 DIAGNOSIS — Z79899 Other long term (current) drug therapy: Secondary | ICD-10-CM | POA: Diagnosis not present

## 2017-11-06 LAB — TSH: TSH: 4.24 (ref 0.41–5.90)

## 2017-11-09 ENCOUNTER — Non-Acute Institutional Stay (SKILLED_NURSING_FACILITY): Payer: Medicare Other

## 2017-11-09 DIAGNOSIS — Z0000 Encounter for general adult medical examination without abnormal findings: Secondary | ICD-10-CM | POA: Diagnosis not present

## 2017-11-09 NOTE — Progress Notes (Addendum)
Subjective:   Dawn [name] is a 77 y.o. female who presents for Medicare Annual (Subsequent) preventive examination at [facility]  Last AWV-11/08/2016       Objective:     Vitals: BP 120/72 (BP Location: Right Arm, Patient Position: Supine)   Pulse 72   Temp 97.8 F (36.6 C) (Oral)   Ht 5\' 8"  (1.727 m)   Wt 145 lb (65.8 kg)   BMI 22.05 kg/m   Body mass index is 22.05 kg/m.  Advanced Directives 11/09/2017 11/08/2016 10/03/2016 10/03/2016 06/15/2016 04/06/2016 03/01/2016  Does Patient Have a Medical Advance Directive? No No No No No No No  Type of Advance Directive - - - - - - -  Does patient want to make changes to medical advance directive? - - - - - - -  Would patient like information on creating a medical advance directive? No - Patient declined No - Patient declined Yes (Inpatient - patient requests chaplain consult to create a medical advance directive) No - Patient declined - - -    Tobacco Social History   Tobacco Use  Smoking Status Former Smoker  . Last attempt to quit: 12/11/2015  . Years since quitting: 1.9  Smokeless Tobacco Never Used  Tobacco Comment   Smoked age [age] up to < 1 ppd, usually half a pack per day, mainly one half pack per day     Counseling given: Not Answered Comment: Smoked age [age] up to < 1 ppd, usually half a pack per day, mainly one half pack per day   Clinical Intake:  Pre-visit preparation completed: No  Pain : 0-10 Pain Score: 7  Pain Type: Chronic pain Pain Location: Back Pain Descriptors / Indicators: Aching Pain Onset: More than a month ago Pain Frequency: Intermittent     Diabetes: No  How often do you need to have someone help you when you read instructions, pamphlets, or other written materials from your doctor or pharmacy?: 1 - Never What is the last grade level you completed in school?: 11th grade  Interpreter Needed?: No  Information entered by :: Tyson Dense, RN  Past Medical History:    Diagnosis Date  . COPD (chronic obstructive pulmonary disease) ([place])   . History of anxiety   . History of depression   . Spinal stenosis   . UTI (urinary tract infection)    Past Surgical History:  Procedure Laterality Date  . ANKLE FRACTURE SURGERY Left   . CATARACT EXTRACTION, BILATERAL    . FLEXIBLE SIGMOIDOSCOPY Left 11/18/2015   Procedure: FLEXIBLE SIGMOIDOSCOPY;  Surgeon: [name], MD;  Location: [place];  Service: Endoscopy;  Laterality: Left;  . FLEXIBLE SIGMOIDOSCOPY N/A 11/20/2015   Procedure: FLEXIBLE SIGMOIDOSCOPY;  Surgeon: [name], MD;  Location[facility];  Service: Endoscopy;  Laterality: N/A;  . HIP ARTHROPLASTY Right 01/12/2016   Procedure: ARTHROPLASTY BIPOLAR HIP (HEMIARTHROPLASTY);  Surgeon: [name], MD;  Location: [facility];  Service: Orthopedics;  Laterality: Right;   Family History  Problem Relation Age of Onset  . Heart attack Mother   . Diabetes Father   . Breast cancer Sister   . Thyroid disease Neg Hx    Social History   Socioeconomic History  . Marital status: Divorced    Spouse name: Not on file  . Number of children: Not on file  . Years of education: Not on file  . Highest education level: Not on file  Occupational History  . 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 Financial resource strain: Not hard at all  . Food insecurity:    Worry: Never true    Inability: Never true  . Transportation needs:    Medical: No    Non-medical: No  Tobacco Use  . Smoking status: Former Smoker    Last attempt to quit: 12/11/2015    Years since quitting: 1.9  . Smokeless tobacco: Never Used  . Tobacco comment: Smoked age [age] up to < 1 ppd, usually half a pack per day, mainly one half pack per day  Substance and Sexual Activity  . Alcohol use: No  . Drug use: No  . Sexual activity: Not Currently  Lifestyle  . Physical activity:    Days per week: 1 day    Minutes per session: 30  min  . Stress: Not at all  Relationships  . Social connections:    Talks on phone: Three times a week    Gets together: Once a week    Attends religious service: Never    Active member of club or organization: No    Attends meetings of clubs or organizations: Never    Relationship status: Divorced  Other Topics Concern  . Not on file  Social History Narrative   She is a long-term care resident of [facility].    Outpatient Encounter Medications as of 11/09/2017  Medication Sig  . acetaminophen (TYLENOL) 325 MG tablet Take 650 mg by mouth every 8 (eight) hours as needed.  Marland Kitchen aspirin 81 MG chewable tablet Chew 81 mg by mouth 2 (two) times daily.  . bisacodyl (DULCOLAX) 10 MG suppository Place 10 mg rectally as needed for moderate constipation.  . busPIRone (BUSPAR) 5 MG tablet Take 5 mg by mouth 3 (three) times daily.   . digoxin (LANOXIN) 0.125 MG tablet Take 0.125 mg by mouth daily. Hold if HR <60  . docusate sodium (COLACE) 100 MG capsule Take 1 capsule (100 mg total) by mouth 2 (two) times daily as needed for mild constipation.  Marland Kitchen escitalopram (LEXAPRO) 5 MG tablet Take 15 mg by mouth daily. Give 3 tablets to = 15 mg  . flecainide (TAMBOCOR) 50 MG tablet Take 50 mg by mouth every 12 (twelve) hours.  . fluticasone (FLONASE) 50 MCG/ACT nasal spray Place 2 sprays into both nostrils daily.  Marland Kitchen gabapentin (NEURONTIN) 300 MG capsule Take 1 capsule (300 mg total) by mouth 3 (three) times daily.  Marland Kitchen guaiFENesin (MUCINEX) 600 MG 12 hr tablet Take 600 mg by mouth 2 (two) times daily.  Marland Kitchen HYDROcodone-acetaminophen (NORCO/VICODIN) 5-325 MG tablet Take 1 tablet by mouth every 12 (twelve) hours as needed for moderate pain.  . INCRUSE ELLIPTA 62.5 MCG/INH AEPB Inhale 1 puff into the lungs daily.   Marland Kitchen levalbuterol (XOPENEX) 1.25 MG/3ML nebulizer solution Take 1.25 mg by nebulization every 6 (six) hours as needed for wheezing.   Marland Kitchen levothyroxine (SYNTHROID, LEVOTHROID) 25 MCG  tablet Take 25 mcg by mouth daily before breakfast.  . loratadine (CLARITIN) 10 MG tablet Take 10 mg by mouth daily.  . magnesium hydroxide (MILK OF MAGNESIA) 400 MG/5ML suspension Take 30 mLs by mouth daily as needed for mild constipation.  . Multiple Vitamins-Minerals (MULTIVITAMIN WITH MINERALS) tablet Take 1 tablet by mouth daily.  . naphazoline-glycerin (CLEAR EYES REDNESS RELIEF) 0.012-0.2 % SOLN Place 1 drop into both eyes 2 (two) times daily.  Marland Kitchen NUTRITIONAL SUPPLEMENT LIQD Take 120 mLs by  mouth 2 (two) times daily. MedPass  . ondansetron (ZOFRAN) 4 MG tablet Take 4 mg by mouth 2 (two) times daily.  . OXYGEN Inhale 2 L into the lungs at bedtime.  . pantoprazole (PROTONIX) 40 MG tablet Take 40 mg by mouth daily.  . primidone (MYSOLINE) 50 MG tablet Take 1 tablet (50 mg total) by mouth 2 (two) times daily.  . traZODone (DESYREL) 50 MG tablet Take 50 mg by mouth at bedtime.   Loura Pardon Salicylate (ASPERCREME EX) Apply 1 application topically 2 (two) times daily.   No facility-administered encounter medications on file as of 11/09/2017.     Activities of Daily Living In your present state of health, do you have any difficulty performing the following activities: 11/09/2017  Hearing? N  Vision? N  Difficulty concentrating or making decisions? N  Walking or climbing stairs? Y  Dressing or bathing? N  Doing errands, shopping? Y  Preparing Food and eating ? N  Using the Toilet? N  In the past six months, have you accidently leaked urine? N  Do you have problems with loss of bowel control? N  Managing your Medications? Y  Managing your Finances? Y  Housekeeping or managing your Housekeeping? Y  Some recent data might be hidden    Patient Care Team: [name], MD as PCP - General (Internal Medicine) Medina-Vargas, [name], NP as Nurse Practitioner (Internal Medicine)    Assessment:   This is a routine wellness examination for [place].  Exercise Activities and Dietary  recommendations Current Exercise Habits: Structured exercise class, Type of exercise: stretching, Time (Minutes): 30, Frequency (Times/Week): 1, Weekly Exercise (Minutes/Week): 30, Intensity: Mild, Exercise limited by: orthopedic condition(s)  Goals   None     Fall Risk Fall Risk  11/09/2017 07/14/2017 04/10/2017 11/08/2016 03/01/2016  Falls in the past year? No No Yes Yes Yes  Number falls in past yr: - - 2 or more 2 or more 2 or more  Injury with Fall? - - Yes Yes Yes  Comment - - - R hip, L ankle -  Risk Factor Category  - - High Fall Risk - High Fall Risk  Follow up - - Falls evaluation completed - -   Is the patient's home free of loose throw rugs in walkways, pet beds, electrical cords, etc?   yes      Grab bars in the bathroom? yes      Handrails on the stairs?   yes      Adequate lighting?   yes  Depression Screen PHQ 2/9 Scores 11/09/2017 11/08/2016  PHQ - 2 Score 4 4  PHQ- 9 Score 10 11     Cognitive Function     6CIT Screen 11/09/2017 11/08/2016  What Year? 0 points 0 points  What month? 0 points 0 points  What time? 0 points 0 points  Count back from 20 0 points 0 points  Months in reverse 2 points 0 points  Repeat phrase 4 points 2 points  Total Score 6 2    Immunization History  Administered Date(s) Administered  . Influenza, High Dose Seasonal PF 10/04/2016  . Influenza,inj,Quad PF,6+ Mos 01/13/2016  . Pneumococcal-Unspecified 10/11/2011  . Tdap 01/23/2016    Qualifies for Shingles Vaccine? Not in past records  Screening Tests Health Maintenance  Topic Date Due  . INFLUENZA VACCINE  08/10/2017  . PNA vac Low Risk Adult (2 of 2 - PCV13) 01/10/2018 (Originally 10/10/2012)  . TETANUS/TDAP  01/22/2026  . DEXA SCAN  Discontinued    Cancer Screenings: Lung: Low Dose CT Chest recommended if Age 52-80 years, 30 pack-year currently smoking OR have quit w/in 15years. Patient does not qualify. Breast:  Up to date on Mammogram? Yes   Up to date of Bone  Density/Dexa? Excluded, per osteoporosis history Colorectal: up to date  Additional Screenings:  Hepatitis C Screening: declined Flu vaccine due: will receive at [facility] due: ordered    Plan:    I have personally reviewed and addressed the Medicare Annual Wellness questionnaire and have noted the following in the patient's chart:  A. Medical and social history B. Use of alcohol, tobacco or illicit drugs  C. Current medications and supplements D. Functional ability and status E.  Nutritional status F.  Physical activity G. Advance directives H. List of other physicians I.  Hospitalizations, surgeries, and ER visits in previous 12 months J.  [place] to include hearing, vision, cognitive, depression L. Referrals and appointments - none  In addition, I have reviewed and discussed with patient certain preventive protocols, quality metrics, and best practice recommendations. A written personalized care plan for preventive services as well as general preventive health recommendations were provided to patient.  See attached scanned questionnaire for additional information.   Signed,   Tyson Dense, RN Nurse Health Advisor  Patient Concerns: None  I have personally reviewed the health advisor's clinical note, was available for consultation, and agree with the assessment and plan as written. [name] M.D., FACP, [facility]

## 2017-11-09 NOTE — Patient Instructions (Signed)
Dawn [name] , I have completed the annual wellness visit as per Medicare guidelines. The attached information is provided for the patient's family, care providers, and facility of residence.  Screening recommendations/referrals: Colonoscopy excluded, over age 78 Mammogram excluded, over age 61 Bone Density excluded Recommended yearly ophthalmology/optometry visit for glaucoma screening and checkup Recommended yearly dental visit for hygiene and checkup  Vaccinations: Influenza vaccine due, will receive at [facility] Pneumococcal vaccine 13 due, ordered Tdap vaccine up to date, due 01/22/2026 Shingles vaccine not in past records    Advanced directives: In chart  Conditions/risks identified: Fall Risk  Next appointment: Dr. Linna Darner makes rounds   Preventive Care 39 Years and Older, Female Preventive care refers to lifestyle choices and visits with your health care provider that can promote health and wellness. What does preventive care include?  A yearly physical exam. This is also called an annual well check.  Dental exams once or twice a year.  Routine eye exams. Ask your health care provider how often you should have your eyes checked.  Personal lifestyle choices, including:  Daily care of your teeth and gums.  Regular physical activity.  Eating a healthy diet.  Avoiding tobacco and drug use.  Limiting alcohol use.  Taking vitamin and mineral supplements as recommended by your health care provider. What happens during an annual well check? The services and screenings done by your health care provider during your annual well check will depend on your age, overall health, lifestyle risk factors, and family history of disease. Counseling  Your health care provider may ask you questions about your:  Alcohol use.  Tobacco use.  Drug use.  Emotional well-being.  Home and relationship well-being.  Eating habits.  History of falls.  Memory and ability to  understand (cognition).  Work and work Statistician.  Reproductive health. Screening  You may have the following tests or measurements:  Height, weight, and BMI.  Blood pressure.  Lipid and cholesterol levels. These may be checked every 5 years, or more frequently if you are over 89 years old.  Skin check.  Lung cancer screening. You may have this screening every year starting at age 42 if you have a 30-pack-year history of smoking and currently smoke or have quit within the past 15 years.  Fecal occult blood test (FOBT) of the stool. You may have this test every year starting at age 71.  Flexible sigmoidoscopy or colonoscopy. You may have a sigmoidoscopy every 5 years or a colonoscopy every 10 years starting at age 59.  Hepatitis C blood test.  Hepatitis B blood test.  Diabetes screening. This is done by checking your blood sugar (glucose) after you have not eaten for a while (fasting). You may have this done every 1-3 years.  Bone density scan. This is done to screen for osteoporosis. You may have this done starting at age 65.  Mammogram. This may be done every 1-2 years. Talk to your health care provider about how often you should have regular mammograms. Talk with your health care provider about your test results, treatment options, and if necessary, the need for more tests. Vaccines  Your health care provider may recommend certain vaccines, such as:  Influenza vaccine. This is recommended every year.  Tetanus, diphtheria, and acellular pertussis (Tdap, Td) vaccine. You may need a Td booster every 10 years.  Zoster vaccine. You may need this after age 37.  Pneumococcal 13-valent conjugate (PCV13) vaccine. One dose is recommended after age 76.  Pneumococcal polysaccharide (PPSV23)  vaccine. One dose is recommended after age 62. Talk to your health care provider about which screenings and vaccines you need and how often you need them. This information is not intended to  replace advice given to you by your health care provider. Make sure you discuss any questions you have with your health care provider. Document Released: 01/23/2015 Document Revised: 09/16/2015 Document Reviewed: 10/28/2014 Elsevier Interactive Patient Education  2017 [place] can cause injuries. They can happen to people of all ages. There are many things you can do to make your home safe and to help prevent falls.  What can I do in the bathroom?  Use night lights.  Install grab bars by the toilet and in the tub and shower. Do not use towel bars as grab bars.  Use non-skid mats or decals in the tub or shower.  If you need to sit down in the shower, use a plastic, non-slip stool.  Keep the floor dry. Clean up any water that spills on the floor as soon as it happens.  Remove soap buildup in the tub or shower regularly.  Attach bath mats securely with double-sided non-slip rug tape.  Do not have throw rugs and other things on the floor that can make you trip. What can I do in the bedroom?  Use night lights.  Make sure that you have a light by your bed that is easy to reach.  Do not use any sheets or blankets that are too big for your bed. They should not hang down onto the floor.  Have a firm chair that has side arms. You can use this for support while you get dressed.  Do not have throw rugs and other things on the floor that can make you trip. What else can I do to help prevent falls?  Wear shoes that:  Do not have high heels.  Have rubber bottoms.  Are comfortable and fit you well.  Are closed at the toe. Do not wear sandals.  If you use a stepladder:  Make sure that it is fully opened. Do not climb a closed stepladder.  Make sure that both sides of the stepladder are locked into place.  Ask someone to hold it for you, if possible.  Clearly mark and make sure that you can see:  Any grab bars or handrails.  First and last  steps.  Where the edge of each step is.  Use tools that help you move around (mobility aids) if they are needed. These include:  Canes.  Walkers.  Scooters.  Crutches.  Turn on the lights when you go into a dark area. Replace any light bulbs as soon as they burn out.  Set up your furniture so you have a clear path. Avoid moving your furniture around.  If any of your floors are uneven, fix them.  Review your medicines with your doctor. Some medicines can make you feel dizzy. This can increase your chance of falling. Ask your doctor what other things that you can do to help prevent falls. This information is not intended to replace advice given to you by your health care provider. Make sure you discuss any questions you have with your health care provider. Document Released: 10/23/2008 Document Revised: 06/04/2015 Document Reviewed: 01/31/2014 Elsevier Interactive Patient Education  2017 [employer].

## 2017-11-10 ENCOUNTER — Encounter: Payer: Self-pay | Admitting: Adult Health

## 2017-11-10 ENCOUNTER — Non-Acute Institutional Stay (SKILLED_NURSING_FACILITY): Payer: Medicare Other | Admitting: Adult Health

## 2017-11-10 DIAGNOSIS — L720 Epidermal cyst: Secondary | ICD-10-CM

## 2017-11-10 DIAGNOSIS — Z23 Encounter for immunization: Secondary | ICD-10-CM | POA: Diagnosis not present

## 2017-11-10 NOTE — Progress Notes (Signed)
Location:  [place] Room Number: 762-G Place of Service:  SNF (31) Provider:  Durenda Age, NP  Patient Care Team: [name], MD as PCP - General (Internal Medicine) Medina-Vargas, [name], NP as Nurse Practitioner (Internal Medicine)  Extended Emergency Contact Information Primary Emergency Contact: Hatfield,Lisa Address: [address]          [place], [place] [address] [place] Work Phone: [phone] Mobile Phone: [phone] Relation: Daughter  Code Status:  Full Code  Goals of care: Advanced Directive information Advanced Directives 11/09/2017  Does Patient Have a Medical Advance Directive? No  Type of Advance Directive -  Does patient want to make changes to medical advance directive? -  Would patient like information on creating a medical advance directive? No - Patient declined     Chief Complaint  Patient presents with  . Acute Visit    Patient with lipoma on back of neck    HPI:  Pt is a 77 y.o. Dawn [name] seen today for an acute visit for a cyst on her neck.  She is a long-term care resident of [facility].  She has a PMH of COPD, anemia, hyperthyroidism, chronic pain syndrome, and depression. Noted a lobular cyst measuring 1 1/2 in, soft, movable, transilluminated. No lymphadenopathy noted on the neck. She reported that whenever she applies warm compress, there is a foul drainage that comes out.   Past Medical History:  Diagnosis Date  . COPD (chronic obstructive pulmonary disease) ([employer])   . History of anxiety   . History of depression   . Spinal stenosis   . UTI (urinary tract infection)    Past Surgical History:  Procedure Laterality Date  . ANKLE FRACTURE SURGERY Left   . CATARACT EXTRACTION, BILATERAL    . FLEXIBLE SIGMOIDOSCOPY Left 11/18/2015   Procedure: FLEXIBLE SIGMOIDOSCOPY;  Surgeon: [name], MD;  Location: [place];  Service: Endoscopy;  Laterality: Left;  .  FLEXIBLE SIGMOIDOSCOPY N/A 11/20/2015   Procedure: FLEXIBLE SIGMOIDOSCOPY;  Surgeon: [name], MD;  Location: [facility] ENDOSCOPY;  Service: Endoscopy;  Laterality: N/A;  . HIP ARTHROPLASTY Right 01/12/2016   Procedure: ARTHROPLASTY BIPOLAR HIP (HEMIARTHROPLASTY);  Surgeon: [name], MD;  Location: [facility];  Service: Orthopedics;  Laterality: Right;    Allergies  Allergen Reactions  . Biofreeze [Menthol (Topical Analgesic)] Rash    By history Aspercreme does not cause a rash    Outpatient Encounter Medications as of 11/10/2017  Medication Sig  . acetaminophen (TYLENOL) 325 MG tablet Take 650 mg by mouth every 8 (eight) hours as needed.  Marland Kitchen aspirin 81 MG chewable tablet Chew 81 mg by mouth 2 (two) times daily.  . bisacodyl (DULCOLAX) 10 MG suppository Place 10 mg rectally as needed for moderate constipation.  . busPIRone (BUSPAR) 5 MG tablet Take 5 mg by mouth 3 (three) times daily.   . digoxin (LANOXIN) 0.125 MG tablet Take 0.125 mg by mouth daily. Hold if HR <60  . docusate sodium (COLACE) Dawn MG capsule Take 1 capsule (Dawn mg total) by mouth 2 (two) times daily as needed for mild constipation.  . flecainide (TAMBOCOR) 50 MG tablet Take 50 mg by mouth every 12 (twelve) hours.  . fluticasone (FLONASE) 50 MCG/ACT nasal spray Place 2 sprays into both nostrils daily.  Marland Kitchen gabapentin (NEURONTIN) 300 MG capsule Take 1 capsule (300 mg total) by mouth 3 (three) times daily.  Marland Kitchen guaiFENesin (MUCINEX) 600 MG 12 hr tablet Take 600 mg by mouth 2 (  two) times daily.  Marland Kitchen HYDROcodone-acetaminophen (NORCO/VICODIN) 5-325 MG tablet Take 1 tablet by mouth every 12 (twelve) hours as needed for moderate pain.  . INCRUSE ELLIPTA 62.5 MCG/INH AEPB Inhale 1 puff into the lungs daily.   Marland Kitchen levalbuterol (XOPENEX) 1.25 MG/3ML nebulizer solution Take 1.25 mg by nebulization every 6 (six) hours as needed for wheezing.   Marland Kitchen levothyroxine (SYNTHROID, LEVOTHROID) 25 MCG tablet Take 25 mcg by mouth daily before breakfast.  .  loratadine (CLARITIN) 10 MG tablet Take 10 mg by mouth daily.  . magnesium hydroxide (MILK OF MAGNESIA) 400 MG/5ML suspension Take 30 mLs by mouth daily as needed for mild constipation.  . Multiple Vitamins-Minerals (MULTIVITAMIN WITH MINERALS) tablet Take 1 tablet by mouth daily.  . naphazoline-glycerin (CLEAR EYES REDNESS RELIEF) 0.012-0.2 % SOLN Place 1 drop into both eyes 2 (two) times daily.  Marland Kitchen NUTRITIONAL SUPPLEMENT LIQD Take 120 mLs by mouth 2 (two) times daily. MedPass  . nystatin (MYCOSTATIN) 100000 UNIT/ML suspension Take 5 mLs by mouth 4 (four) times daily.  . ondansetron (ZOFRAN) 4 MG tablet Take 4 mg by mouth 2 (two) times daily.  . OXYGEN Inhale 2 L into the lungs at bedtime.  . pantoprazole (PROTONIX) 40 MG tablet Take 40 mg by mouth daily.  . primidone (MYSOLINE) 50 MG tablet Take 1 tablet (50 mg total) by mouth 2 (two) times daily.  . traZODone (DESYREL) 50 MG tablet Take 50 mg by mouth at bedtime.   Loura Pardon Salicylate (ASPERCREME EX) Apply 1 application topically 2 (two) times daily.  Marland Kitchen escitalopram (LEXAPRO) 5 MG tablet Take 15 mg by mouth daily. Give 3 tablets to = 15 mg   No facility-administered encounter medications on file as of 11/10/2017.     Review of Systems  GENERAL: No change in appetite, no fatigue, no weight changes, no fever, chills or weakness MOUTH and THROAT: Denies oral discomfort, gingival pain or bleeding RESPIRATORY: no cough, SOB, DOE, wheezing, hemoptysis CARDIAC: No chest pain, edema or palpitations GI: No abdominal pain, diarrhea, constipation, heart burn, nausea or vomiting GU: Denies dysuria, frequency, hematuria, incontinence, or discharge PSYCHIATRIC: Denies feelings of depression or anxiety. No report of hallucinations, insomnia, paranoia, or agitation   Immunization History  Administered Date(s) Administered  . Influenza, High Dose Seasonal PF 10/04/2016  . Influenza,inj,Quad PF,6+ Mos 01/13/2016  . Pneumococcal-Unspecified  10/11/2011  . Tdap 01/23/2016   Pertinent  Health Maintenance Due  Topic Date Due  . INFLUENZA VACCINE  08/10/2017  . PNA vac Low Risk Adult (2 of 2 - PCV13) 01/10/2018 (Originally 10/10/2012)  . DEXA SCAN  Discontinued   Fall Risk  11/09/2017 07/14/2017 04/10/2017 11/08/2016 03/01/2016  Falls in the past year? No No Yes Yes Yes  Number falls in past yr: - - 2 or more 2 or more 2 or more  Injury with Fall? - - Yes Yes Yes  Comment - - - R hip, L ankle -  Risk Factor Category  - - High Fall Risk - High Fall Risk  Follow up - - Falls evaluation completed - -     Vitals:   11/10/17 1223  BP: 101/62  Pulse: 64  Resp: 18  Temp: 97.7 F (36.5 C)  TempSrc: Oral  SpO2: 95%  Weight: 145 lb 12.8 oz (66.1 kg)  Height: 5\' 8"  (1.727 m)   Body mass index is 22.17 kg/m.  Physical Exam  GENERAL APPEARANCE: Well nourished. In no acute distress. Normal body habitus SKIN:  lobular cyst at back  of neck, soft, transilluminates, and movable MOUTH and THROAT: Lips are without lesions. Oral mucosa is moist and without lesions. Tongue is normal in shape, size, and color and without lesions RESPIRATORY: Breathing is even & unlabored, BS CTAB CARDIAC: RRR, no murmur,no extra heart sounds, no edema GI: Abdomen soft, normal BS, no masses, no tenderness EXTREMITIES:  Able to move X 4 extremitiest NEUROLOGICAL: There is no tremor. Speech is clear PSYCHIATRIC: Alert and oriented X 3. Affect and behavior are appropriate  Labs reviewed: Recent Labs    11/24/16 03/10/17  NA 140 144  K 4.7 4.4  BUN 15 14  CREATININE 1.3* 1.2*   Recent Labs    03/10/17  AST 12*  ALT 8  ALKPHOS 104   Recent Labs    03/10/17 04/10/17  WBC 5.8 5.8  NEUTROABS 3 4  HGB 11.0* 11.8*  HCT 32* 36  PLT 205 207   Lab Results  Component Value Date   TSH 4.24 11/06/2017   Lab Results  Component Value Date   HGBA1C 5.2 12/01/2015   Lab Results  Component Value Date   CHOL 107 12/01/2015   HDL 27 (L) 12/01/2015     LDLCALC 59 12/01/2015   TRIG 107 12/01/2015   CHOLHDL 4.0 12/01/2015    Assessment/Plan  1. Epidermal inclusion cyst -  Will monitor, does not need treatment at this time.    Family/ staff Communication: Discussed plan of care with resident.   Labs/tests ordered:  None  Goals of care:   Long-term care.     Durenda Age, NP [facility] [phone] (Monday-Friday 8:00 a.m. - 5:00 p.m.) [phone] (after hours)

## 2017-11-17 ENCOUNTER — Other Ambulatory Visit: Payer: Self-pay

## 2017-11-17 MED ORDER — HYDROCODONE-ACETAMINOPHEN 5-325 MG PO TABS: 1.0000 | ORAL_TABLET | Freq: Two times a day (BID) | ORAL | 0 refills | Status: DC | PRN

## 2017-11-17 NOTE — Telephone Encounter (Signed)
Durenda Age, NP wrote hard script and gave to [facility], the nurse on the patient.

## 2017-11-22 ENCOUNTER — Ambulatory Visit (HOSPITAL_COMMUNITY)
Admission: RE | Admit: 2017-11-22 | Discharge: 2017-11-22 | Disposition: A | Discharge status: Home / Self Care | Payer: Medicare Other | Source: Ambulatory Visit | Attending: Physician Assistant | Admitting: Physician Assistant

## 2017-11-22 DIAGNOSIS — M5116 Intervertebral disc disorders with radiculopathy, lumbar region: Secondary | ICD-10-CM | POA: Diagnosis not present

## 2017-11-22 DIAGNOSIS — M48061 Spinal stenosis, lumbar region without neurogenic claudication: Secondary | ICD-10-CM | POA: Insufficient documentation

## 2017-11-22 DIAGNOSIS — M5416 Radiculopathy, lumbar region: Secondary | ICD-10-CM | POA: Diagnosis present

## 2017-11-22 DIAGNOSIS — M4186 Other forms of scoliosis, lumbar region: Secondary | ICD-10-CM | POA: Diagnosis not present

## 2017-11-22 NOTE — Progress Notes (Signed)
Is she able to fu with [facility]ton to discuss mri and get ESI at same appt?

## 2017-11-23 ENCOUNTER — Encounter: Payer: Self-pay | Admitting: Internal Medicine

## 2017-11-23 ENCOUNTER — Telehealth (INDEPENDENT_AMBULATORY_CARE_PROVIDER_SITE_OTHER): Payer: Self-pay | Admitting: *Deleted

## 2017-11-23 DIAGNOSIS — M4800 Spinal stenosis, site unspecified: Secondary | ICD-10-CM | POA: Insufficient documentation

## 2017-11-23 NOTE — Telephone Encounter (Signed)
Called pt mobile and phone is out of service. Will try again later.

## 2017-12-01 DIAGNOSIS — G4700 Insomnia, unspecified: Secondary | ICD-10-CM | POA: Diagnosis not present

## 2017-12-01 DIAGNOSIS — F339 Major depressive disorder, recurrent, unspecified: Secondary | ICD-10-CM | POA: Diagnosis not present

## 2017-12-01 DIAGNOSIS — F419 Anxiety disorder, unspecified: Secondary | ICD-10-CM | POA: Diagnosis not present

## 2017-12-04 ENCOUNTER — Encounter: Payer: Self-pay | Admitting: Adult Health

## 2017-12-04 ENCOUNTER — Non-Acute Institutional Stay (SKILLED_NURSING_FACILITY): Payer: Medicare Other | Admitting: Adult Health

## 2017-12-04 DIAGNOSIS — G4700 Insomnia, unspecified: Secondary | ICD-10-CM | POA: Diagnosis not present

## 2017-12-04 DIAGNOSIS — R400 Somnolence: Secondary | ICD-10-CM | POA: Diagnosis not present

## 2017-12-04 DIAGNOSIS — M545 Low back pain, unspecified: Secondary | ICD-10-CM

## 2017-12-04 DIAGNOSIS — G8929 Other chronic pain: Secondary | ICD-10-CM

## 2017-12-04 DIAGNOSIS — R251 Tremor, unspecified: Secondary | ICD-10-CM

## 2017-12-04 NOTE — Progress Notes (Signed)
Location:  [place] Room Number: 295-J Place of Service:  SNF (31) Provider:  Durenda Age, NP  Patient Care Team: [name], MD as PCP - General (Internal Medicine) Medina-Vargas, [name], NP as Nurse Practitioner (Internal Medicine)  Extended Emergency Contact Information Primary Emergency Contact: Hatfield,Lisa Address: [address]          [place], [place] [address] [place] Work Phone: [phone] Mobile Phone: [phone] Relation: Daughter  Code Status:  Full Code  Goals of care: Advanced Directive information Advanced Directives 11/09/2017  Does Patient Have a Medical Advance Directive? No  Type of Advance Directive -  Does patient want to make changes to medical advance directive? -  Would patient like information on creating a medical advance directive? No - Patient declined     Chief Complaint  Patient presents with  . Acute Visit    Patient is seen for medication management, is complaining that the gabapentin makes her too sleepy.    HPI:  Pt is a 77 y.o. female seen today for an acute visit secondary to complaints that her gabapentin is making her too sleepy.  She is a long-term care resident of [facility].  She has a PMH of COPD, anemia, hyperthyroidism, chronic pain syndrome, and depression. She said that she has been refusing her Gabapentin in the morning and only takes them at bedtime due to being too sleepy. She complains of having low back pain and would like to see the orthopedics again. She had methylprednisolone injection at that sacroiliac joint on 09/15/2017 for sacroiliitis. MR of the lumbar spine showed severe stenosis on the right L4-5 and right foraminal stenosis at T12-L1, L1-2, L2-3.     Past Medical History:  Diagnosis Date  . COPD (chronic obstructive pulmonary disease) ([place])   . History of anxiety   . History of depression   . Spinal stenosis   . UTI (urinary  tract infection)    Past Surgical History:  Procedure Laterality Date  . ANKLE FRACTURE SURGERY Left   . CATARACT EXTRACTION, BILATERAL    . FLEXIBLE SIGMOIDOSCOPY Left 11/18/2015   Procedure: FLEXIBLE SIGMOIDOSCOPY;  Surgeon: [name], MD;  Location: [place];  Service: Endoscopy;  Laterality: Left;  . FLEXIBLE SIGMOIDOSCOPY N/A 11/20/2015   Procedure: FLEXIBLE SIGMOIDOSCOPY;  Surgeon: [name], MD;  Location: [facility] ENDOSCOPY;  Service: Endoscopy;  Laterality: N/A;  . HIP ARTHROPLASTY Right 01/12/2016   Procedure: ARTHROPLASTY BIPOLAR HIP (HEMIARTHROPLASTY);  Surgeon: [name], MD;  Location: [facility];  Service: Orthopedics;  Laterality: Right;    Allergies  Allergen Reactions  . Biofreeze [Menthol (Topical Analgesic)] Rash    By history Aspercreme does not cause a rash    Outpatient Encounter Medications as of 12/04/2017  Medication Sig  . acetaminophen (TYLENOL) 325 MG tablet Take 650 mg by mouth every 8 (eight) hours as needed.  Marland Kitchen aspirin 81 MG chewable tablet Chew 81 mg by mouth 2 (two) times daily.  . bisacodyl (DULCOLAX) 10 MG suppository Place 10 mg rectally as needed for moderate constipation.  . busPIRone (BUSPAR) 5 MG tablet Take 5 mg by mouth 3 (three) times daily.   . digoxin (LANOXIN) 0.125 MG tablet Take 0.125 mg by mouth daily. Hold if HR <60  . docusate sodium (COLACE) 100 MG capsule Take 1 capsule (100 mg total) by mouth 2 (two) times daily as needed for mild constipation.  Marland Kitchen escitalopram (LEXAPRO) 5 MG tablet Take 15 mg by mouth daily.  Give 3 tablets to = 15 mg  . flecainide (TAMBOCOR) 50 MG tablet Take 50 mg by mouth every 12 (twelve) hours.  . fluticasone (FLONASE) 50 MCG/ACT nasal spray Place 2 sprays into both nostrils daily.  Marland Kitchen gabapentin (NEURONTIN) 300 MG capsule Take 1 capsule (300 mg total) by mouth 3 (three) times daily.  Marland Kitchen HYDROcodone-acetaminophen (NORCO/VICODIN) 5-325 MG tablet Take 1 tablet by mouth every 12 (twelve) hours as needed for moderate  pain.  . INCRUSE ELLIPTA 62.5 MCG/INH AEPB Inhale 1 puff into the lungs daily.   Marland Kitchen levalbuterol (XOPENEX) 1.25 MG/3ML nebulizer solution Take 1.25 mg by nebulization every 6 (six) hours as needed for wheezing.   Marland Kitchen levothyroxine (SYNTHROID, LEVOTHROID) 25 MCG tablet Take 25 mcg by mouth daily before breakfast.  . loratadine (CLARITIN) 10 MG tablet Take 10 mg by mouth daily.  . magnesium hydroxide (MILK OF MAGNESIA) 400 MG/5ML suspension Take 30 mLs by mouth daily as needed for mild constipation.  . Multiple Vitamins-Minerals (MULTIVITAMIN WITH MINERALS) tablet Take 1 tablet by mouth daily.  . naphazoline-glycerin (CLEAR EYES REDNESS RELIEF) 0.012-0.2 % SOLN Place 1 drop into both eyes 2 (two) times daily.  Marland Kitchen NUTRITIONAL SUPPLEMENT LIQD Take 120 mLs by mouth 2 (two) times daily. MedPass  . ondansetron (ZOFRAN) 4 MG tablet Take 4 mg by mouth 2 (two) times daily.  . OXYGEN Inhale 2 L into the lungs at bedtime.  . pantoprazole (PROTONIX) 40 MG tablet Take 40 mg by mouth daily.  . primidone (MYSOLINE) 50 MG tablet Take 1 tablet (50 mg total) by mouth 2 (two) times daily.  . traZODone (DESYREL) 50 MG tablet Take 50 mg by mouth at bedtime.   Loura Pardon Salicylate (ASPERCREME EX) Apply 1 application topically 2 (two) times daily. Apply to posterior neck for muscle spasm  . [DISCONTINUED] guaiFENesin (MUCINEX) 600 MG 12 hr tablet Take 600 mg by mouth 2 (two) times daily.   No facility-administered encounter medications on file as of 12/04/2017.     Review of Systems  GENERAL: No change in appetite, no fatigue, no weight changes, no fever, chills or weakness MOUTH and THROAT: Denies oral discomfort, gingival pain or bleeding RESPIRATORY: no cough, SOB, DOE, wheezing, hemoptysis CARDIAC: No chest pain, edema or palpitations GI: No abdominal pain, diarrhea, constipation, heart burn, nausea or vomiting GU: Denies dysuria, frequency, hematuria, incontinence, or discharge NEUROLOGICAL:  +somnolence PSYCHIATRIC: Denies feelings of depression or anxiety. No report of hallucinations, insomnia, paranoia, or agitation    Immunization History  Administered Date(s) Administered  . Influenza, High Dose Seasonal PF 10/04/2016  . Influenza,inj,Quad PF,6+ Mos 01/13/2016  . Influenza-Unspecified 11/10/2017  . Pneumococcal-Unspecified 10/11/2011  . Tdap 01/23/2016   Pertinent  Health Maintenance Due  Topic Date Due  . PNA vac Low Risk Adult (2 of 2 - PCV13) 01/10/2018 (Originally 10/10/2012)  . INFLUENZA VACCINE  Completed  . DEXA SCAN  Discontinued   Fall Risk  11/09/2017 07/14/2017 04/10/2017 11/08/2016 03/01/2016  Falls in the past year? No No Yes Yes Yes  Number falls in past yr: - - 2 or more 2 or more 2 or more  Injury with Fall? - - Yes Yes Yes  Comment - - - R hip, L ankle -  Risk Factor Category  - - High Fall Risk - High Fall Risk  Follow up - - Falls evaluation completed - -      Vitals:   12/04/17 1012  Pulse: 60  Resp: 17  Temp: 98.1 F (36.7 C)  TempSrc: Oral  SpO2: 95%  Weight: 147 lb 9.6 oz (67 kg)  Height: 5\' 8"  (1.727 m)   Body mass index is 22.44 kg/m.  Physical Exam  GENERAL APPEARANCE: Well nourished. In no acute distress. Normal body habitus SKIN:  Skin is warm and dry.  MOUTH and THROAT: Lips are without lesions. Oral mucosa is moist and without lesions. Tongue is normal in shape, size, and color and without lesions RESPIRATORY: Breathing is even & unlabored, BS CTAB CARDIAC: RRR, no murmur,no extra heart sounds, no edema GI: Abdomen soft, normal BS, no masses, no tenderness  EXTREMITIES:  Able to move X 4 extremities NEUROLOGICAL:Occasional tremor of BUE. Speech is clear. Alert and oriented X 3. PSYCHIATRIC:  Affect and behavior are appropriate   Labs reviewed: Recent Labs    03/10/17  NA 144  K 4.4  BUN 14  CREATININE 1.2*   Recent Labs    03/10/17  AST 12*  ALT 8  ALKPHOS 104   Recent Labs    03/10/17 04/10/17  WBC 5.8  5.8  NEUTROABS 3 4  HGB 11.0* 11.8*  HCT 32* 36  PLT 205 207   Lab Results  Component Value Date   TSH 4.24 11/06/2017   Lab Results  Component Value Date   HGBA1C 5.2 12/01/2015   Lab Results  Component Value Date   CHOL 107 12/01/2015   HDL 27 (L) 12/01/2015   LDLCALC 59 12/01/2015   TRIG 107 12/01/2015   CHOLHDL 4.0 12/01/2015    Significant Diagnostic Results in last 30 days:  Mr Lumbar Spine W/o Contrast  Result Date: 11/22/2017 CLINICAL DATA:  Lumbar radiculopathy. Back pain with right buttock and hip pain EXAM: MRI LUMBAR SPINE WITHOUT CONTRAST TECHNIQUE: Multiplanar, multisequence MR imaging of the lumbar spine was performed. No intravenous contrast was administered. COMPARISON:  Lumbar radiographs 07/18/2017 FINDINGS: Segmentation:  Normal Alignment: Moderate levoscoliosis. Mild retrolisthesis L2-3 and L3-4. 4 mm anterolisthesis L5-S1 Vertebrae:  Negative for fracture or mass. Conus medullaris and cauda equina: Conus extends to the L2 level. Conus and cauda equina appear normal. Paraspinal and other soft tissues: 6 cm left renal cyst. No paraspinous mass. Disc levels: T12-L1: Moderate disc degeneration and spurring with right foraminal stenosis L1-2: Disc degeneration and spurring right greater than left with moderate right foraminal encroachment. L2-3: Disc degeneration and diffuse bulging of the disc. Mild spinal stenosis. Moderate subarticular stenosis bilaterally right greater than left due to spurring. L3-4: Disc degeneration and spondylosis with mild facet degeneration. Mild subarticular stenosis bilaterally L4-5: Disc degeneration and spurring. Marked facet hypertrophy on the left. Severe subarticular foraminal stenosis on the left due to spurring. L5-S1: Mild anterolisthesis. Bilateral facet degeneration with mild subarticular stenosis bilaterally. IMPRESSION: Lumbar scoliosis with multilevel degenerative change causing stenosis as described above. The most severe stenosis  is on the right at L4-5. There is right foraminal stenosis at T12-L1 L1-2 and L2-3. Electronically Signed   By: Franchot Gallo M.D.   On: 11/22/2017 16:16    Assessment/Plan  1. Somnolence - she reports being sleepy and has been refusing morning dose of Gabapentin, will decrease Gabapentin from 300 mg TID to 100 mg give 2 capsules = 200 mg Q 6AM, 2PM and 10PM, fall precautions  2. Chronic right-sided low back pain, unspecified whether sciatica present - continue Norco 5-325 mg 1 tab every 12 hours as needed for pain and gabapentin, follow-up with orthopedics, Dr. Ernestina Patches, to discuss results of MR lumbar done on 11/13  3. Tremor -continue  primidone 50 mg 1 tab twice a day  4. Insomnia -  She reported being sleepy, will change Trazodone 50 mg Q HS to PRN    Family/ staff Communication: Discussed plan of care with resident.  Labs/tests ordered:  CBC, CMP  Goals of care:   Long-term care.   Durenda Age, NP [facility] [phone] (Monday-Friday 8:00 a.m. - 5:00 p.m.) [phone] (after hours)

## 2017-12-05 DIAGNOSIS — D649 Anemia, unspecified: Secondary | ICD-10-CM | POA: Diagnosis not present

## 2017-12-05 DIAGNOSIS — Z79899 Other long term (current) drug therapy: Secondary | ICD-10-CM | POA: Diagnosis not present

## 2017-12-05 LAB — BASIC METABOLIC PANEL
BUN: 16 (ref 4–21)
CREATININE: 1.1 (ref 0.5–1.1)
Glucose: 88
POTASSIUM: 5.1 (ref 3.4–5.3)
Sodium: 141 (ref 137–147)

## 2017-12-05 LAB — CBC AND DIFFERENTIAL
HCT: 31 — AB (ref 36–46)
HEMOGLOBIN: 10.5 — AB (ref 12.0–16.0)
Neutrophils Absolute: 4
PLATELETS: 213 (ref 150–399)
WBC: 6.2

## 2017-12-05 LAB — HEPATIC FUNCTION PANEL
ALT: 8 (ref 7–35)
AST: 12 — AB (ref 13–35)
Alkaline Phosphatase: 97 (ref 25–125)
Bilirubin, Total: 0.2

## 2017-12-14 ENCOUNTER — Encounter (INDEPENDENT_AMBULATORY_CARE_PROVIDER_SITE_OTHER): Payer: Self-pay | Admitting: Orthopaedic Surgery

## 2017-12-14 ENCOUNTER — Ambulatory Visit (INDEPENDENT_AMBULATORY_CARE_PROVIDER_SITE_OTHER): Payer: Medicare Other | Admitting: Orthopaedic Surgery

## 2017-12-14 DIAGNOSIS — M5416 Radiculopathy, lumbar region: Secondary | ICD-10-CM | POA: Diagnosis not present

## 2017-12-14 NOTE — Progress Notes (Signed)
Office Visit Note   Patient: Dawn [name]           Date of Birth: February 23, 1940           MRN: [id number] Visit Date: 12/14/2017              Requested by: [name], [place], [place] [address] PCP: [name], MD   Assessment & Plan: Visit Diagnoses:  1. Lumbar radiculopathy     Plan: MRI shows multilevel degenerative disc disease and scoliosis and facet disease resulting in spinal stenosis.  We will refer her to Dr. Ernestina Patches for epidural steroid injection.  Follow-Up Instructions: Return if symptoms worsen or fail to improve.   Orders:  No orders of the defined types were placed in this encounter.  No orders of the defined types were placed in this encounter.     Procedures: No procedures performed   Clinical Data: No additional findings.   Subjective: Chief Complaint  Patient presents with  . [place] returns today for follow-up of MRI review.  She endorses chronic back pain.   Review of Systems   Objective: Vital Signs: There were no vitals taken for this visit.  Physical Exam  Ortho Exam Exam is unchanged. Specialty Comments:  No specialty comments available.  Imaging: No results found.   PMFS History: Patient Active Problem List   Diagnosis Date Noted  . Spinal stenosis 11/23/2017  . Headache 08/31/2017  . Elevated TSH 08/31/2017  . Pain in right hip 07/18/2017  . Right-sided low back pain with right-sided sciatica 07/18/2017  . Abnormal chest x-ray 04/04/2017  . Tremor 04/04/2017  . Bimalleolar ankle fracture, left, sequela 10/11/2016  . Altered mental status 10/03/2016  . Neuropathy 10/03/2016  . Chronic diastolic CHF (congestive heart failure) ([place]) 10/03/2016  . Acute lower UTI   . Recurrent falls 02/02/2016  . 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 Upper GI bleed 01/23/2016  . Blood loss anemia 01/23/2016  . Acute diastolic CHF (congestive heart failure) ([place])  01/23/2016  . Heme + stool   . Abnormal CT scan, colon   . 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 Hypoxia   . At risk for adverse drug reaction 01/18/2016  . Subcapital fracture of right hip, closed 01/12/2016  . Alteration consciousness 11/30/2015  . SVT (supraventricular tachycardia) ([place]) 11/27/2015  . Diarrhea 11/27/2015  . Depression, recurrent ([place]) 11/27/2015  . Polyneuropathy 11/27/2015  . Renal insufficiency   . Protein-calorie malnutrition, severe 11/18/2015  . Intra-abdominal fluid collection   . Premature atrial contractions   . Functional diarrhea   . Sepsis ([place])   . Hyperthyroidism   . Hypotension   . Septic shock ([place])   . Acute encephalopathy   . Hypokalemia   . Acute delirium 11/10/2015  . AKI (acute kidney injury) ([place]) 11/10/2015  . Anemia, iron deficiency 11/10/2015  . Anxiety 11/10/2015  . Chronic pain syndrome 11/10/2015  . Recurrent UTI 11/10/2015  . Acute respiratory failure with hypoxia ([place]) 11/10/2015  . COPD (chronic obstructive pulmonary disease) ([place]) 11/10/2015  . Acute hyperglycemia 11/10/2015  . CKD (chronic kidney disease) stage 3, GFR 30-59 ml/min (HCC) 11/10/2015  . Narrow complex tachycardia ([place]) 11/10/2015  . HTN (hypertension) 11/10/2015  . Generalized weakness 11/10/2015  . Anemia   . Delirium   . Hypoxemia   . Tachycardia    Past Medical History:  Diagnosis Date  . COPD (chronic obstructive pulmonary  disease) ([place])   . History of anxiety   . History of depression   . Spinal stenosis   . UTI (urinary tract infection)     Family History  Problem Relation Age of Onset  . Heart attack Mother   . Diabetes Father   . Breast cancer Sister   . Thyroid disease Neg Hx     Past Surgical History:  Procedure Laterality Date  . ANKLE FRACTURE SURGERY Left   . CATARACT EXTRACTION, BILATERAL    . FLEXIBLE SIGMOIDOSCOPY Left 11/18/2015   Procedure: FLEXIBLE SIGMOIDOSCOPY;  Surgeon: [name], MD;  Location: [facility];   Service: Endoscopy;  Laterality: Left;  . FLEXIBLE SIGMOIDOSCOPY N/A 11/20/2015   Procedure: FLEXIBLE SIGMOIDOSCOPY;  Surgeon: [name], MD;  Location: [facility];  Service: Endoscopy;  Laterality: N/A;  . HIP ARTHROPLASTY Right 01/12/2016   Procedure: ARTHROPLASTY BIPOLAR HIP (HEMIARTHROPLASTY);  Surgeon: [name], MD;  Location: [facility];  Service: Orthopedics;  Laterality: Right;   Social History   Occupational History  . 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 Smoking status: Former Smoker    Last attempt to quit: 12/11/2015    Years since quitting: 2.0  . Smokeless tobacco: Never Used  . Tobacco comment: Smoked age [age] up to < 1 ppd, usually half a pack per day, mainly one half pack per day  Substance and Sexual Activity  . Alcohol use: No  . Drug use: No  . Sexual activity: Not Currently

## 2017-12-15 ENCOUNTER — Other Ambulatory Visit: Payer: Self-pay

## 2017-12-15 MED ORDER — HYDROCODONE-ACETAMINOPHEN 5-325 MG PO TABS: 1.0000 | ORAL_TABLET | Freq: Two times a day (BID) | ORAL | 0 refills | Status: DC | PRN

## 2017-12-15 NOTE — Telephone Encounter (Signed)
Hard script written by Durenda Age, NP and given to [facility], nurse.

## 2017-12-25 DIAGNOSIS — M545 Low back pain: Secondary | ICD-10-CM | POA: Diagnosis not present

## 2017-12-25 DIAGNOSIS — M4800 Spinal stenosis, site unspecified: Secondary | ICD-10-CM | POA: Diagnosis not present

## 2017-12-25 DIAGNOSIS — R2681 Unsteadiness on feet: Secondary | ICD-10-CM | POA: Diagnosis not present

## 2017-12-26 DIAGNOSIS — M4800 Spinal stenosis, site unspecified: Secondary | ICD-10-CM | POA: Diagnosis not present

## 2017-12-26 DIAGNOSIS — M545 Low back pain: Secondary | ICD-10-CM | POA: Diagnosis not present

## 2017-12-26 DIAGNOSIS — R2681 Unsteadiness on feet: Secondary | ICD-10-CM | POA: Diagnosis not present

## 2017-12-27 ENCOUNTER — Encounter: Payer: Self-pay | Admitting: Adult Health

## 2017-12-27 ENCOUNTER — Non-Acute Institutional Stay (SKILLED_NURSING_FACILITY): Payer: Medicare Other | Admitting: Adult Health

## 2017-12-27 DIAGNOSIS — J410 Simple chronic bronchitis: Secondary | ICD-10-CM | POA: Diagnosis not present

## 2017-12-27 DIAGNOSIS — I471 Supraventricular tachycardia: Secondary | ICD-10-CM | POA: Diagnosis not present

## 2017-12-27 DIAGNOSIS — R2681 Unsteadiness on feet: Secondary | ICD-10-CM | POA: Diagnosis not present

## 2017-12-27 DIAGNOSIS — M5416 Radiculopathy, lumbar region: Secondary | ICD-10-CM

## 2017-12-27 DIAGNOSIS — Z7189 Other specified counseling: Secondary | ICD-10-CM | POA: Diagnosis not present

## 2017-12-27 DIAGNOSIS — F418 Other specified anxiety disorders: Secondary | ICD-10-CM | POA: Diagnosis not present

## 2017-12-27 DIAGNOSIS — E038 Other specified hypothyroidism: Secondary | ICD-10-CM | POA: Diagnosis not present

## 2017-12-27 DIAGNOSIS — G629 Polyneuropathy, unspecified: Secondary | ICD-10-CM | POA: Diagnosis not present

## 2017-12-27 DIAGNOSIS — M4800 Spinal stenosis, site unspecified: Secondary | ICD-10-CM | POA: Diagnosis not present

## 2017-12-27 DIAGNOSIS — R251 Tremor, unspecified: Secondary | ICD-10-CM

## 2017-12-27 DIAGNOSIS — Z8719 Personal history of other diseases of the digestive system: Secondary | ICD-10-CM | POA: Diagnosis not present

## 2017-12-27 DIAGNOSIS — M545 Low back pain: Secondary | ICD-10-CM | POA: Diagnosis not present

## 2017-12-27 NOTE — Progress Notes (Signed)
Location:  [facility] Room Number: 932-T Place of Service:  SNF (31) Provider:  Durenda Age, NP  Patient Care Team: [name], MD as PCP - General (Internal Medicine) Medina-Vargas, [name], NP as Nurse Practitioner (Internal Medicine)  Extended Emergency Contact Information Primary Emergency Contact: Hatfield,Lisa Address: [address]          [place], [place] [address] [place] Work Phone: [phone] Mobile Phone: [phone] Relation: Daughter  Code Status:  Full Code  Goals of care: Advanced Directive information Advanced Directives 11/09/2017  Does Patient Have a Medical Advance Directive? No  Type of Advance Directive -  Does patient want to make changes to medical advance directive? -  Would patient like information on creating a medical advance directive? No - Patient declined     Chief Complaint  Patient presents with  . Medical Management of Chronic Issues    Routine [facility] visit  . Advanced Directive    Care plan meeting    HPI:  Pt is a 77 y.o. female seen today for medical management of chronic diseases, as well as a care plan meeting.  She is a long-term care resident of [facility].  She has a PMH of COPD, anemia, hyperthyroidism, chronic pain syndrome, and depression.   Care plan meeting attended by NP, social worker, ADON and [place] coordinator. Resident was invited but was busy with her arts craft. She remains to be full code. She is currently having PT for gait training. She was experiencing somnolence with Neurontin so it was decreased from 300 mg TID to 200 mg TID. She follows up with orthopedics for lumbar radiculopathy. No SOB has been reported. Current medications were reviewed with IDT. The care plan meeting lasted for 19 minutes.    Past Medical History:  Diagnosis Date  . COPD (chronic obstructive pulmonary disease) ([place])   . History of anxiety   . History of  depression   . Spinal stenosis   . UTI (urinary tract infection)    Past Surgical History:  Procedure Laterality Date  . ANKLE FRACTURE SURGERY Left   . CATARACT EXTRACTION, BILATERAL    . FLEXIBLE SIGMOIDOSCOPY Left 11/18/2015   Procedure: FLEXIBLE SIGMOIDOSCOPY;  Surgeon: [name], MD;  Location: [facility];  Service: Endoscopy;  Laterality: Left;  . FLEXIBLE SIGMOIDOSCOPY N/A 11/20/2015   Procedure: FLEXIBLE SIGMOIDOSCOPY;  Surgeon: [name], MD;  Location: [facility] ENDOSCOPY;  Service: Endoscopy;  Laterality: N/A;  . HIP ARTHROPLASTY Right 01/12/2016   Procedure: ARTHROPLASTY BIPOLAR HIP (HEMIARTHROPLASTY);  Surgeon: [name], MD;  Location: [facility];  Service: Orthopedics;  Laterality: Right;    Allergies  Allergen Reactions  . Biofreeze [Menthol (Topical Analgesic)] Rash    By history Aspercreme does not cause a rash    Outpatient Encounter Medications as of 12/27/2017  Medication Sig  . acetaminophen (TYLENOL) 325 MG tablet Take 650 mg by mouth every 8 (eight) hours as needed.  Marland Kitchen aspirin 81 MG chewable tablet Chew 81 mg by mouth 2 (two) times daily.  . bisacodyl (DULCOLAX) 10 MG suppository Place 10 mg rectally as needed for moderate constipation.  . busPIRone (BUSPAR) 5 MG tablet Take 5 mg by mouth 3 (three) times daily.   . digoxin (LANOXIN) 0.125 MG tablet Take 0.125 mg by mouth daily. Hold if HR <60  . docusate sodium (COLACE) 100 MG capsule Take 1 capsule (100 mg total) by mouth 2 (two) times daily as needed for mild constipation.  Marland Kitchen  escitalopram (LEXAPRO) 5 MG tablet Take 15 mg by mouth daily. Give 3 tablets to = 15 mg  . flecainide (TAMBOCOR) 50 MG tablet Take 50 mg by mouth every 12 (twelve) hours.  . fluticasone (FLONASE) 50 MCG/ACT nasal spray Place 2 sprays into both nostrils daily.  Marland Kitchen gabapentin (NEURONTIN) 300 MG capsule Take 1 capsule (300 mg total) by mouth 3 (three) times daily.  Marland Kitchen HYDROcodone-acetaminophen (NORCO/VICODIN) 5-325 MG tablet Take 1 tablet by mouth  every 12 (twelve) hours as needed for moderate pain.  . INCRUSE ELLIPTA 62.5 MCG/INH AEPB Inhale 1 puff into the lungs daily.   Marland Kitchen levalbuterol (XOPENEX) 1.25 MG/3ML nebulizer solution Take 1.25 mg by nebulization every 6 (six) hours as needed for wheezing.   Marland Kitchen levothyroxine (SYNTHROID, LEVOTHROID) 25 MCG tablet Take 25 mcg by mouth daily before breakfast.  . loratadine (CLARITIN) 10 MG tablet Take 10 mg by mouth daily.  . magnesium hydroxide (MILK OF MAGNESIA) 400 MG/5ML suspension Take 30 mLs by mouth daily as needed for mild constipation.  . Multiple Vitamins-Minerals (MULTIVITAMIN WITH MINERALS) tablet Take 1 tablet by mouth daily.  . naphazoline-glycerin (CLEAR EYES REDNESS RELIEF) 0.012-0.2 % SOLN Place 1 drop into both eyes 2 (two) times daily.  Marland Kitchen NUTRITIONAL SUPPLEMENT LIQD Take 120 mLs by mouth 2 (two) times daily. MedPass  . ondansetron (ZOFRAN) 4 MG tablet Take 4 mg by mouth 2 (two) times daily.  . OXYGEN Inhale 2 L into the lungs at bedtime.  . pantoprazole (PROTONIX) 40 MG tablet Take 40 mg by mouth daily.  . polyethylene glycol (MIRALAX / GLYCOLAX) packet Take 17 g by mouth daily.  . primidone (MYSOLINE) 50 MG tablet Take 1 tablet (50 mg total) by mouth 2 (two) times daily.  . traZODone (DESYREL) 50 MG tablet Take 50 mg by mouth at bedtime.   Loura Pardon Salicylate (ASPERCREME EX) Apply 1 application topically 2 (two) times daily. Apply to posterior neck for muscle spasm   No facility-administered encounter medications on file as of 12/27/2017.     Review of Systems  GENERAL: No change in appetite, no fatigue, no weight changes, no fever, chills or weakness MOUTH and THROAT: Denies oral discomfort, gingival pain or bleeding RESPIRATORY: no cough, SOB, DOE, wheezing, hemoptysis CARDIAC: No chest pain, edema or palpitations GI: No abdominal pain, diarrhea, constipation, heart burn, nausea or vomiting GU: Denies dysuria, frequency, hematuria, incontinence, or  discharge NEUROLOGICAL: Denies dizziness, syncope, numbness, or headache PSYCHIATRIC: Denies feelings of depression or anxiety. No report of hallucinations, insomnia, paranoia, or agitation   Immunization History  Administered Date(s) Administered  . Influenza, High Dose Seasonal PF 10/04/2016  . Influenza,inj,Quad PF,6+ Mos 01/13/2016  . Influenza-Unspecified 11/10/2017  . Pneumococcal-Unspecified 10/11/2011  . Tdap 01/23/2016   Pertinent  Health Maintenance Due  Topic Date Due  . PNA vac Low Risk Adult (2 of 2 - PCV13) 01/10/2018 (Originally 10/10/2012)  . INFLUENZA VACCINE  Completed  . DEXA SCAN  Discontinued   Fall Risk  11/09/2017 07/14/2017 04/10/2017 11/08/2016 03/01/2016  Falls in the past year? No No Yes Yes Yes  Number falls in past yr: - - 2 or more 2 or more 2 or more  Injury with Fall? - - Yes Yes Yes  Comment - - - R hip, L ankle -  Risk Factor Category  - - High Fall Risk - High Fall Risk  Follow up - - Falls evaluation completed - -      Vitals:   12/27/17 1000  BP: Marland Kitchen)  102/59  Pulse: 77  Resp: 20  Temp: (!) 96.7 F (35.9 C)  TempSrc: Oral  SpO2: 96%  Weight: 145 lb (65.8 kg)  Height: 5\' 8"  (1.727 m)   Body mass index is 22.05 kg/m.  Physical Exam  GENERAL APPEARANCE: Well nourished. In no acute distress. Normal body habitus SKIN:  Skin is warm and dry.  MOUTH and THROAT: Lips are without lesions. Oral mucosa is moist and without lesions.  RESPIRATORY: Breathing is even & unlabored, BS CTAB CARDIAC: RRR, no murmur,no extra heart sounds, no edema GI: Abdomen soft, normal BS, no masses, no tenderness EXTREMITIES:  Able to move X 4 extremities NEUROLOGICAL: There is no tremor. Speech is clear. Alert and oriented X 3. PSYCHIATRIC:  Affect and behavior are appropriate   Labs reviewed: Recent Labs    03/10/17 12/05/17  NA 144 141  K 4.4 5.1  BUN 14 16  CREATININE 1.2* 1.1   Recent Labs    03/10/17 12/05/17  AST 12* 12*  ALT 8 8  ALKPHOS 104 97    Recent Labs    03/10/17 04/10/17 12/05/17  WBC 5.8 5.8 6.2  NEUTROABS 3 4 4   HGB 11.0* 11.8* 10.5*  HCT 32* 36 31*  PLT 205 207 213   Lab Results  Component Value Date   TSH 4.24 11/06/2017   Lab Results  Component Value Date   HGBA1C 5.2 12/01/2015   Lab Results  Component Value Date   CHOL 107 12/01/2015   HDL 27 (L) 12/01/2015   LDLCALC 59 12/01/2015   TRIG 107 12/01/2015   CHOLHDL 4.0 12/01/2015    Assessment/Plan  1. Tremor - stable, continue Primidone 50 mg 1 tab BID   2. Neuropathy - continue Gabapentin 100 mg 2 capsules = 200 mg TID   3. Simple chronic bronchitis (HCC) - no reported SOB, she uses O2 2L/min via [place] at night and PRN, continue Levalbuterol neb PRN, [place] Ellipta   4. Lumbar radiculopathy - follows up with orthopedic, continue Norco 5-325 mg 1 tab Q 12 hours PRN   5. SVT (supraventricular tachycardia) (HCC) - well-controlled, continue Flecainide 50 mg Q 12 hours   6. Other specified hypothyroidism - continue Levothyroxine 25 mcg 1 tab daily Lab Results  Component Value Date   TSH 4.24 11/06/2017     7. History of GI bleed - no reported GI blee and Lexapro 5 mg 3 tabs =15 mg dailyd, continue Pantoprazole 40 mg daily   8. Anxiety with depression - mood is stable, continue Buspirone 5 mg TID   9. Advance care planning - discussed plan of care with IDT, continues to be full code     Family/ staff Communication: Discussed plan of care with IDT.  Labs/tests ordered:  None  Goals of care:   Long-term care.   Durenda Age, NP [facility] [phone] (Monday-Friday 8:00 a.m. - 5:00 p.m.) [phone] (after hours)

## 2017-12-28 DIAGNOSIS — R2681 Unsteadiness on feet: Secondary | ICD-10-CM | POA: Diagnosis not present

## 2017-12-28 DIAGNOSIS — M545 Low back pain: Secondary | ICD-10-CM | POA: Diagnosis not present

## 2017-12-28 DIAGNOSIS — M4800 Spinal stenosis, site unspecified: Secondary | ICD-10-CM | POA: Diagnosis not present

## 2017-12-29 ENCOUNTER — Ambulatory Visit (INDEPENDENT_AMBULATORY_CARE_PROVIDER_SITE_OTHER): Payer: Self-pay

## 2017-12-29 ENCOUNTER — Ambulatory Visit (INDEPENDENT_AMBULATORY_CARE_PROVIDER_SITE_OTHER): Payer: Medicare Other | Admitting: Physical Medicine and Rehabilitation

## 2017-12-29 ENCOUNTER — Encounter (INDEPENDENT_AMBULATORY_CARE_PROVIDER_SITE_OTHER): Payer: Self-pay | Admitting: Physical Medicine and Rehabilitation

## 2017-12-29 VITALS — BP 123/68 | HR 69 | Temp 98.0°F

## 2017-12-29 DIAGNOSIS — M5416 Radiculopathy, lumbar region: Secondary | ICD-10-CM | POA: Diagnosis not present

## 2017-12-29 DIAGNOSIS — M545 Low back pain: Secondary | ICD-10-CM | POA: Diagnosis not present

## 2017-12-29 DIAGNOSIS — M4800 Spinal stenosis, site unspecified: Secondary | ICD-10-CM | POA: Diagnosis not present

## 2017-12-29 DIAGNOSIS — R2681 Unsteadiness on feet: Secondary | ICD-10-CM | POA: Diagnosis not present

## 2017-12-29 MED ORDER — BETAMETHASONE SOD PHOS & ACET 6 (3-3) MG/ML IJ SUSP
12.0000 mg | Freq: Once | INTRAMUSCULAR | Status: AC
  Administered 2017-12-29: 12 mg

## 2017-12-29 NOTE — Progress Notes (Signed)
Numeric Pain Rating Scale and Functional Assessment Average Pain 9   In the last MONTH (on 0-10 scale) has pain interfered with the following?  1. General activity like being  able to carry out your everyday physical activities such as walking, climbing stairs, carrying groceries, or moving a chair?  Rating(6)    +Driver, -BT, -Dye Allergies. 

## 2017-12-29 NOTE — Patient Instructions (Signed)

## 2018-01-01 DIAGNOSIS — M545 Low back pain: Secondary | ICD-10-CM | POA: Diagnosis not present

## 2018-01-01 DIAGNOSIS — B351 Tinea unguium: Secondary | ICD-10-CM | POA: Diagnosis not present

## 2018-01-01 DIAGNOSIS — I739 Peripheral vascular disease, unspecified: Secondary | ICD-10-CM | POA: Diagnosis not present

## 2018-01-01 DIAGNOSIS — M4800 Spinal stenosis, site unspecified: Secondary | ICD-10-CM | POA: Diagnosis not present

## 2018-01-01 DIAGNOSIS — R2681 Unsteadiness on feet: Secondary | ICD-10-CM | POA: Diagnosis not present

## 2018-01-02 DIAGNOSIS — M545 Low back pain: Secondary | ICD-10-CM | POA: Diagnosis not present

## 2018-01-02 DIAGNOSIS — M4800 Spinal stenosis, site unspecified: Secondary | ICD-10-CM | POA: Diagnosis not present

## 2018-01-02 DIAGNOSIS — R2681 Unsteadiness on feet: Secondary | ICD-10-CM | POA: Diagnosis not present

## 2018-01-04 DIAGNOSIS — M545 Low back pain: Secondary | ICD-10-CM | POA: Diagnosis not present

## 2018-01-04 DIAGNOSIS — R2681 Unsteadiness on feet: Secondary | ICD-10-CM | POA: Diagnosis not present

## 2018-01-04 DIAGNOSIS — M4800 Spinal stenosis, site unspecified: Secondary | ICD-10-CM | POA: Diagnosis not present

## 2018-01-05 DIAGNOSIS — M545 Low back pain: Secondary | ICD-10-CM | POA: Diagnosis not present

## 2018-01-05 DIAGNOSIS — R2681 Unsteadiness on feet: Secondary | ICD-10-CM | POA: Diagnosis not present

## 2018-01-05 DIAGNOSIS — M4800 Spinal stenosis, site unspecified: Secondary | ICD-10-CM | POA: Diagnosis not present

## 2018-01-06 DIAGNOSIS — R2681 Unsteadiness on feet: Secondary | ICD-10-CM | POA: Diagnosis not present

## 2018-01-06 DIAGNOSIS — M4800 Spinal stenosis, site unspecified: Secondary | ICD-10-CM | POA: Diagnosis not present

## 2018-01-06 DIAGNOSIS — M545 Low back pain: Secondary | ICD-10-CM | POA: Diagnosis not present

## 2018-01-08 DIAGNOSIS — R2681 Unsteadiness on feet: Secondary | ICD-10-CM | POA: Diagnosis not present

## 2018-01-08 DIAGNOSIS — M545 Low back pain: Secondary | ICD-10-CM | POA: Diagnosis not present

## 2018-01-08 DIAGNOSIS — M4800 Spinal stenosis, site unspecified: Secondary | ICD-10-CM | POA: Diagnosis not present

## 2018-01-09 DIAGNOSIS — M4800 Spinal stenosis, site unspecified: Secondary | ICD-10-CM | POA: Diagnosis not present

## 2018-01-09 DIAGNOSIS — M545 Low back pain: Secondary | ICD-10-CM | POA: Diagnosis not present

## 2018-01-09 DIAGNOSIS — R2681 Unsteadiness on feet: Secondary | ICD-10-CM | POA: Diagnosis not present

## 2018-01-10 DIAGNOSIS — R2681 Unsteadiness on feet: Secondary | ICD-10-CM | POA: Diagnosis not present

## 2018-01-10 DIAGNOSIS — M545 Low back pain: Secondary | ICD-10-CM | POA: Diagnosis not present

## 2018-01-10 DIAGNOSIS — M4800 Spinal stenosis, site unspecified: Secondary | ICD-10-CM | POA: Diagnosis not present

## 2018-01-11 DIAGNOSIS — R2681 Unsteadiness on feet: Secondary | ICD-10-CM | POA: Diagnosis not present

## 2018-01-11 DIAGNOSIS — M4800 Spinal stenosis, site unspecified: Secondary | ICD-10-CM | POA: Diagnosis not present

## 2018-01-11 DIAGNOSIS — M545 Low back pain: Secondary | ICD-10-CM | POA: Diagnosis not present

## 2018-01-13 DIAGNOSIS — R2681 Unsteadiness on feet: Secondary | ICD-10-CM | POA: Diagnosis not present

## 2018-01-13 DIAGNOSIS — M545 Low back pain: Secondary | ICD-10-CM | POA: Diagnosis not present

## 2018-01-13 DIAGNOSIS — M4800 Spinal stenosis, site unspecified: Secondary | ICD-10-CM | POA: Diagnosis not present

## 2018-01-15 ENCOUNTER — Other Ambulatory Visit: Payer: Self-pay

## 2018-01-15 ENCOUNTER — Other Ambulatory Visit: Payer: Self-pay | Admitting: Adult Health

## 2018-01-15 DIAGNOSIS — M545 Low back pain: Secondary | ICD-10-CM | POA: Diagnosis not present

## 2018-01-15 DIAGNOSIS — M4800 Spinal stenosis, site unspecified: Secondary | ICD-10-CM | POA: Diagnosis not present

## 2018-01-15 DIAGNOSIS — R2681 Unsteadiness on feet: Secondary | ICD-10-CM | POA: Diagnosis not present

## 2018-01-15 MED ORDER — ACETAMINOPHEN 325 MG PO TABS: 650.0000 mg | ORAL_TABLET | Freq: Three times a day (TID) | ORAL | 0 refills | Status: DC | PRN

## 2018-01-15 MED ORDER — HYDROCODONE-ACETAMINOPHEN 5-325 MG PO TABS: 1.0000 | ORAL_TABLET | Freq: Two times a day (BID) | ORAL | 0 refills | Status: DC | PRN

## 2018-01-15 NOTE — Telephone Encounter (Signed)
Refill routed to NP for approval and transmittal to [facility].

## 2018-01-15 NOTE — Progress Notes (Signed)
Subjective:   Dawn [name] was seen in consultation in the movement disorder clinic at the request of  [name], MDThe evaluation is for tremor.  The records that were made available to me were reviewed.  Only note from SNF regarding tremor is in Dec, 2018 which describes "fine tremor" in the bilateral UE.  Tremor started approximately a year ago, but it has gotten worse, and involves the bilateral UE.  Tremor is most noticeable when using the hands, esp with putting on makeup.   There is no family hx of tremor.    Affected by caffeine:  No. (1 big cup coffee) Affected by alcohol:  Doesn't drink EtOH Affected by stress:  Yes.   Affected by fatigue:  No. Spills soup if on spoon:  No. Spills glass of liquid if full:  No. Affects ADL's (tying shoes, brushing teeth, etc):  No.  Current/Previously tried tremor medications: n/a  Current medications that may exacerbate tremor:  COPD meds (xopenex - uses this bid per patient)  Outside reports reviewed: historical medical records, lab reports, office notes and referral letter/letters.  07/14/17 update: Patient is seen today in follow-up.  She was started on primidone for tremor last visit.  Records from the nursing facility have been reviewed.  The patient reported to the nursing facility that she was not sure that primidone have been beneficial.  She states that at first it seemed like it may have been beneficial but "that wore off."  She is able to drink without lids.  Poor appetite but able to eat without throwing food.  Using inhaler every morning for lungs.  01/17/18 update: Patient seen today in follow-up for tremor.  I increased her primidone last visit to 50 mg twice per day.  We called her several times about a month after that to see how she was doing, but never received a call back.  Today, she states that tremor is not good.  She is not sure if the medications for her lungs make things worse.  She does not think she is getting  any nebulizer treatments.  She has trouble holding her phone because of tremor.  Allergies  Allergen Reactions  . Biofreeze [Menthol (Topical Analgesic)] Rash    By history Aspercreme does not cause a rash    Outpatient Encounter Medications as of 01/17/2018  Medication Sig  . acetaminophen (TYLENOL) 325 MG tablet Take 2 tablets (650 mg total) by mouth every 8 (eight) hours as needed.  Marland Kitchen aspirin 81 MG chewable tablet Chew 81 mg by mouth 2 (two) times daily.  . bisacodyl (DULCOLAX) 10 MG suppository Place 10 mg rectally as needed for moderate constipation.  . busPIRone (BUSPAR) 5 MG tablet Take 5 mg by mouth 3 (three) times daily.   . digoxin (LANOXIN) 0.125 MG tablet Take 0.125 mg by mouth daily. Hold if HR <60  . docusate sodium (COLACE) 100 MG capsule Take 1 capsule (100 mg total) by mouth 2 (two) times daily as needed for mild constipation.  Marland Kitchen escitalopram (LEXAPRO) 5 MG tablet Take 15 mg by mouth daily. Give 3 tablets to = 15 mg  . flecainide (TAMBOCOR) 50 MG tablet Take 50 mg by mouth every 12 (twelve) hours.  . fluticasone (FLONASE) 50 MCG/ACT nasal spray Place 2 sprays into both nostrils daily.  Marland Kitchen gabapentin (NEURONTIN) 100 MG capsule Take 200 mg by mouth 3 (three) times daily.  Marland Kitchen HYDROcodone-acetaminophen (NORCO/VICODIN) 5-325 MG tablet Take 1 tablet by mouth every 12 (twelve)  hours as needed for moderate pain.  . INCRUSE ELLIPTA 62.5 MCG/INH AEPB Inhale 1 puff into the lungs daily.   Marland Kitchen levalbuterol (XOPENEX) 1.25 MG/3ML nebulizer solution Take 1.25 mg by nebulization every 6 (six) hours as needed for wheezing.   Marland Kitchen levothyroxine (SYNTHROID, LEVOTHROID) 25 MCG tablet Take 25 mcg by mouth daily before breakfast.  . loratadine (CLARITIN) 10 MG tablet Take 10 mg by mouth daily.  . magnesium hydroxide (MILK OF MAGNESIA) 400 MG/5ML suspension Take 30 mLs by mouth daily as needed for mild constipation.  . Multiple Vitamins-Minerals (MULTIVITAMIN WITH MINERALS) tablet Take 1 tablet by mouth  daily.  . naphazoline-glycerin (CLEAR EYES REDNESS RELIEF) 0.012-0.2 % SOLN Place 1 drop into both eyes 2 (two) times daily.  Marland Kitchen NUTRITIONAL SUPPLEMENT LIQD Take 120 mLs by mouth 2 (two) times daily. MedPass  . ondansetron (ZOFRAN) 4 MG tablet Take 4 mg by mouth 2 (two) times daily.  . OXYGEN Inhale 2 L into the lungs at bedtime.  . pantoprazole (PROTONIX) 40 MG tablet Take 40 mg by mouth daily.  . polyethylene glycol (MIRALAX / GLYCOLAX) packet Take 17 g by mouth daily.  . primidone (MYSOLINE) 50 MG tablet Take 1 tablet (50 mg total) by mouth 2 (two) times daily.  . traZODone (DESYREL) 50 MG tablet Take 50 mg by mouth at bedtime.   Loura Pardon Salicylate (ASPERCREME EX) Apply 1 application topically 2 (two) times daily. Apply to posterior neck for muscle spasm  . primidone (MYSOLINE) 50 MG tablet 2 tablets in the morning, 1 tablet at night  . [DISCONTINUED] gabapentin (NEURONTIN) 300 MG capsule Take 1 capsule (300 mg total) by mouth 3 (three) times daily.   No facility-administered encounter medications on file as of 01/17/2018.     Past Medical History:  Diagnosis Date  . COPD (chronic obstructive pulmonary disease) ([place])   . History of anxiety   . History of depression   . Spinal stenosis   . UTI (urinary tract infection)     Past Surgical History:  Procedure Laterality Date  . ANKLE FRACTURE SURGERY Left   . CATARACT EXTRACTION, BILATERAL    . FLEXIBLE SIGMOIDOSCOPY Left 11/18/2015   Procedure: FLEXIBLE SIGMOIDOSCOPY;  Surgeon: [name], MD;  Location: [facility];  Service: Endoscopy;  Laterality: Left;  . FLEXIBLE SIGMOIDOSCOPY N/A 11/20/2015   Procedure: FLEXIBLE SIGMOIDOSCOPY;  Surgeon: [name], MD;  Location: [facility] ENDOSCOPY;  Service: Endoscopy;  Laterality: N/A;  . HIP ARTHROPLASTY Right 01/12/2016   Procedure: ARTHROPLASTY BIPOLAR HIP (HEMIARTHROPLASTY);  Surgeon: [name], MD;  Location: [facility];  Service: Orthopedics;  Laterality: Right;    Social History    Socioeconomic History  . Marital status: Divorced    Spouse name: Not on file  . Number of children: Not on file  . Years of education: Not on file  . Highest education level: Not on file  Occupational History  . 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 Financial resource strain: Not hard at all  . Food insecurity:    Worry: Never true    Inability: Never true  . Transportation needs:    Medical: No    Non-medical: No  Tobacco Use  . Smoking status: Former Smoker    Last attempt to quit: 12/11/2015    Years since quitting: 2.1  . Smokeless tobacco: Never Used  . Tobacco comment: Smoked age [age] up to < 1 ppd, usually half a  pack per day, mainly one half pack per day  Substance and Sexual Activity  . Alcohol use: No  . Drug use: No  . Sexual activity: Not Currently  Lifestyle  . Physical activity:    Days per week: 1 day    Minutes per session: 30 min  . Stress: Not at all  Relationships  . Social connections:    Talks on phone: Three times a week    Gets together: Once a week    Attends religious service: Never    Active member of club or organization: No    Attends meetings of clubs or organizations: Never    Relationship status: Divorced  . Intimate partner violence:    Fear of current or ex partner: No    Emotionally abused: No    Physically abused: No    Forced sexual activity: No  Other Topics Concern  . Not on file  Social History Narrative   She is a long-term care resident of [facility].    Family Status  Relation Name Status  . Mother  Deceased  . Father  Deceased  . MGM  Deceased  . MGF  Deceased  . PGM  Deceased  . PGF  Deceased  . Sister 2 Alive  . Brother 2 Alive  . Daughter  Alive  . Sister  Deceased  . Brother  Deceased       plane crash  . Neg Hx  (Not Specified)    Review of Systems Review of Systems  Constitutional: Negative.   HENT: Negative.    Eyes: Negative.   Respiratory: Positive for shortness of breath.   Cardiovascular: Negative.   Skin: Positive for rash.  Neurological: Positive for tremors.  Endo/Heme/Allergies: Negative.       Objective:   VITALS:   Vitals:   01/17/18 0934  BP: 116/64  Pulse: 70  SpO2: (!) 88%   GEN:  The patient appears stated age and is in NAD. HEENT:  Normocephalic, atraumatic.  The mucous membranes are moist. The superficial temporal arteries are without ropiness or tenderness. CV: Regular rate rhythm. Lungs:  CTAB.  Was wearing her oxygen, but when dropped off by the nursing facility, the tank was empty.  She was given a new tank here at the office and oxygen level did rise. Neck/HEME:  There are no carotid bruits bilaterally.  Neurological examination:  Orientation: The patient is alert and oriented x3. Cranial nerves: There is good facial symmetry. The speech is fluent and clear. Soft palate rises symmetrically and there is no tongue deviation. Hearing is intact to conversational tone. Sensation: Sensation is intact to light touch throughout Motor: Strength is at least antigravity x 4 Gait: did not ambulate pt today as did not have O2 with her Coordination: There is no decremation with rapid alternating movements.      MOVEMENT EXAM: Tremor:  There is tremor in the bilateral upper extremities.  It is somewhat irregular.  Unlike last visit, the left seems somewhat worse than the right today.  There is a rest component.  She actually is able to draw Archimedes spirals fairly well.  She still has tremor when she is given a weight to hold.  Labs:  Lab Results  Component Value Date   TSH 4.24 11/06/2017     Chemistry      Component Value Date/Time   NA 141 12/05/2017   K 5.1 12/05/2017   CL 103 10/07/2016 0347   CO2 31 10/07/2016  0347   BUN 16 12/05/2017   CREATININE 1.1 12/05/2017   CREATININE 1.21 (H) 10/07/2016 0347   GLU 88 12/05/2017      Component Value Date/Time    CALCIUM 8.1 (L) 10/07/2016 0347   ALKPHOS 97 12/05/2017   AST 12 (A) 12/05/2017   ALT 8 12/05/2017   BILITOT 0.5 10/07/2016 0347          Assessment/Plan:   1.  Essential Tremor.  -increase primidone to 100 mg in the morning and 50 mg at night.  -Discussed with the patient that I do think the medications for her COPD are causing some of the tremor. 2.  F/u 6 months  CC:  [name], MD

## 2018-01-17 ENCOUNTER — Encounter: Payer: Self-pay | Admitting: Neurology

## 2018-01-17 ENCOUNTER — Ambulatory Visit (INDEPENDENT_AMBULATORY_CARE_PROVIDER_SITE_OTHER): Payer: Medicare Other | Admitting: Neurology

## 2018-01-17 VITALS — BP 116/64 | HR 70

## 2018-01-17 DIAGNOSIS — G250 Essential tremor: Secondary | ICD-10-CM

## 2018-01-17 DIAGNOSIS — R2681 Unsteadiness on feet: Secondary | ICD-10-CM | POA: Diagnosis not present

## 2018-01-17 DIAGNOSIS — J449 Chronic obstructive pulmonary disease, unspecified: Secondary | ICD-10-CM | POA: Diagnosis not present

## 2018-01-17 DIAGNOSIS — M4800 Spinal stenosis, site unspecified: Secondary | ICD-10-CM | POA: Diagnosis not present

## 2018-01-17 DIAGNOSIS — M545 Low back pain: Secondary | ICD-10-CM | POA: Diagnosis not present

## 2018-01-17 MED ORDER — PRIMIDONE 50 MG PO TABS: ORAL_TABLET | ORAL | 1 refills | Status: DC

## 2018-01-18 DIAGNOSIS — R0989 Other specified symptoms and signs involving the circulatory and respiratory systems: Secondary | ICD-10-CM | POA: Diagnosis not present

## 2018-01-18 DIAGNOSIS — R05 Cough: Secondary | ICD-10-CM | POA: Diagnosis not present

## 2018-01-23 ENCOUNTER — Non-Acute Institutional Stay (SKILLED_NURSING_FACILITY): Payer: Medicare Other | Admitting: Internal Medicine

## 2018-01-23 ENCOUNTER — Encounter: Payer: Self-pay | Admitting: Internal Medicine

## 2018-01-23 DIAGNOSIS — J441 Chronic obstructive pulmonary disease with (acute) exacerbation: Secondary | ICD-10-CM | POA: Diagnosis not present

## 2018-01-23 NOTE — Progress Notes (Signed)
    NURSING HOME LOCATION:  [facility] ROOM NUMBER:  [id number]  CODE STATUS:  Full Code  PCP:  [name], MD  [place] [address]  This is a nursing facility follow up for specific acute issue of cough and fever.  Interim medical record and care since last [facility] visit was updated with review of diagnostic studies and change in clinical status since last visit were documented.  HPI: She states that she began to be ill approximately 6 days ago as a cough.  Robitussin was prescribed and she began to cough up green sputum.  Also she describes green nasal discharge. She can not state which is greater in volume. She describes profound weakness to the point she wants to stay in bed.  She also describes poor appetite.  She has some itchy eyes but this is a chronic issue; she denies other extrinsic symptoms.  She smoked from age 89 to age 69 at least a pack per day.  Gives no history of asthma but does believe she has allergies as a cause of her itchy eyes. Mobile imaging was completed 01/18/2018.  This reveals chronic changes but no active infiltrate.  She has marked COPD with hyperinflation and hyperlucent upper lobes.  There are increased markings in the right lower lobe but a previously documented pneumonic process is resolved.  Left hemidiaphragm is chronically elevated  Review of systems: She apparently denies pain in the frontal and maxillary sinuses.  She also denies urticaria or sneezing.   Eyes: No redness, discharge, pain, vision change ENT/mouth: No earache, change in hearing, sore throat  Cardiovascular: No chest pain, palpitations, paroxysmal nocturnal dyspnea, claudication, edema  Respiratory: No hemoptysis Genitourinary: No dysuria, hematuria, pyuria  Physical exam:  Pertinent or positive findings: She appears chronically ill and pale.Nasal O2 in place. Masked facies present.  Her affect is flat.  She tends to stare straight ahead.  There is a  subtle hyperpigmentation of the upper and lower eyelids.Slight tremor of lips present.  She is wearing the upper partial but not the mandibular denture.  Heart rate is slow but regular.  She exhibits an intermittent deep cough without production.  She has intermittent coarse rhonchi greater in the right anterior chest than the left.  Breath sounds are decreased in the upper lobes posteriorly.  She had some expiratory wheezing in the right lower lobe.  She exhibits an intermittent tremor of the left hand.  She is generally weak to opposition.  She has scattered bruising.  General appearance: no acute distress, increased work of breathing is present.   Lymphatic: No lymphadenopathy about the head, neck, axilla. Eyes: No conjunctival inflammation or lid edema is present. There is no scleral icterus. Ears:  External ear exam shows no significant lesions or deformities. Canals clear. TMs w/o erythema. Nose:  External nasal examination shows no deformity or inflammation. Nasal mucosa moist without lesions, exudates Oral exam:  Lips and gums are healthy appearing. There is no oropharyngeal erythema or exudate. Neck:  No thyromegaly, masses, tenderness noted.    Heart:  No gallop, murmur, click, rub .  Extremities:  No cyanosis, clubbing, edema  Skin: Warm & dry w/o tenting. No significant lesions or rash.  See assessment and plan under acute diagnosis of COPD exacerbation

## 2018-01-24 DIAGNOSIS — G4700 Insomnia, unspecified: Secondary | ICD-10-CM | POA: Diagnosis not present

## 2018-01-24 DIAGNOSIS — F339 Major depressive disorder, recurrent, unspecified: Secondary | ICD-10-CM | POA: Diagnosis not present

## 2018-01-24 DIAGNOSIS — F419 Anxiety disorder, unspecified: Secondary | ICD-10-CM | POA: Diagnosis not present

## 2018-01-24 LAB — CBC AND DIFFERENTIAL
HCT: 29 — AB (ref 36–46)
Hemoglobin: 9.7 — AB (ref 12.0–16.0)
Neutrophils Absolute: 7
PLATELETS: 294 (ref 150–399)
WBC: 8.2

## 2018-01-25 NOTE — Patient Instructions (Signed)
See assessment & plan acutely for this visit

## 2018-01-29 DIAGNOSIS — D649 Anemia, unspecified: Secondary | ICD-10-CM | POA: Diagnosis not present

## 2018-01-29 DIAGNOSIS — D509 Iron deficiency anemia, unspecified: Secondary | ICD-10-CM | POA: Diagnosis not present

## 2018-01-29 DIAGNOSIS — E039 Hypothyroidism, unspecified: Secondary | ICD-10-CM | POA: Diagnosis not present

## 2018-01-29 LAB — CBC AND DIFFERENTIAL
HCT: 30 — AB (ref 36–46)
Hemoglobin: 10.4 — AB (ref 12.0–16.0)
Neutrophils Absolute: 7
Platelets: 358 (ref 150–399)
WBC: 9

## 2018-01-30 ENCOUNTER — Encounter: Payer: Self-pay | Admitting: Adult Health

## 2018-01-30 ENCOUNTER — Non-Acute Institutional Stay (SKILLED_NURSING_FACILITY): Payer: Medicare Other | Admitting: Adult Health

## 2018-01-30 DIAGNOSIS — J410 Simple chronic bronchitis: Secondary | ICD-10-CM

## 2018-01-30 DIAGNOSIS — R251 Tremor, unspecified: Secondary | ICD-10-CM | POA: Diagnosis not present

## 2018-01-30 DIAGNOSIS — I471 Supraventricular tachycardia: Secondary | ICD-10-CM | POA: Diagnosis not present

## 2018-01-30 DIAGNOSIS — G894 Chronic pain syndrome: Secondary | ICD-10-CM | POA: Diagnosis not present

## 2018-01-30 DIAGNOSIS — F339 Major depressive disorder, recurrent, unspecified: Secondary | ICD-10-CM

## 2018-01-30 NOTE — Progress Notes (Addendum)
Location:  [facility] Room Number: 786-V Place of Service:  SNF (31) Provider:  Durenda Age, NP  Patient Care Team: [name], MD as PCP - General (Internal Medicine) Medina-Vargas, [name], NP as Nurse Practitioner (Internal Medicine)  Extended Emergency Contact Information Primary Emergency Contact: Hatfield,Lisa Address: [address]          [place], [place] [address] [place] Work Phone: [phone] Mobile Phone: [phone] Relation: Daughter  Code Status:  Full Code  Goals of care: Advanced Directive information Advanced Directives 11/09/2017  Does Patient Have a Medical Advance Directive? No  Type of Advance Directive -  Does patient want to make changes to medical advance directive? -  Would patient like information on creating a medical advance directive? No - Patient declined     Chief Complaint  Patient presents with  . Medical Management of Chronic Issues    Routine [facility] SN[facility] visit    HPI:  Pt is a 78 y.o. female seen today for medical management of chronic diseases.  She is a long-term care resident of [facility].  She has a PMH of COPD, anemia, hyperthyroidism, chronic pain syndrome, and depression. She was seen in the room today. She continues to be on Augmentin and Prednisone till today only for COPD exacerbation. She denies having SOB. She claims to sleep good at night. She denies having constipation.    Past Medical History:  Diagnosis Date  . COPD (chronic obstructive pulmonary disease) ([place])   . History of anxiety   . History of depression   . Spinal stenosis   . UTI (urinary tract infection)    Past Surgical History:  Procedure Laterality Date  . ANKLE FRACTURE SURGERY Left   . CATARACT EXTRACTION, BILATERAL    . FLEXIBLE SIGMOIDOSCOPY Left 11/18/2015   Procedure: FLEXIBLE SIGMOIDOSCOPY;  Surgeon: [name], MD;  Location: [facility];  Service: Endoscopy;   Laterality: Left;  . FLEXIBLE SIGMOIDOSCOPY N/A 11/20/2015   Procedure: FLEXIBLE SIGMOIDOSCOPY;  Surgeon: [name], MD;  Location: [facility];  Service: Endoscopy;  Laterality: N/A;  . HIP ARTHROPLASTY Right 01/12/2016   Procedure: ARTHROPLASTY BIPOLAR HIP (HEMIARTHROPLASTY);  Surgeon: [name], MD;  Location: [facility];  Service: Orthopedics;  Laterality: Right;    Allergies  Allergen Reactions  . Biofreeze [Menthol (Topical Analgesic)] Rash    By history Aspercreme does not cause a rash    Outpatient Encounter Medications as of 01/30/2018  Medication Sig  . acetaminophen (TYLENOL) 325 MG tablet Take 2 tablets (650 mg total) by mouth every 8 (eight) hours as needed.  Marland Kitchen amoxicillin-clavulanate (AUGMENTIN) 875-125 MG tablet Take 1 tablet by mouth 2 (two) times daily.  Marland Kitchen aspirin 81 MG chewable tablet Chew 81 mg by mouth 2 (two) times daily.  . bisacodyl (DULCOLAX) 10 MG suppository Place 10 mg rectally as needed for moderate constipation.  . busPIRone (BUSPAR) 5 MG tablet Take 5 mg by mouth 3 (three) times daily.   . digoxin (LANOXIN) 0.125 MG tablet Take 0.125 mg by mouth daily. Hold if HR <60  . docusate sodium (COLACE) 100 MG capsule Take 1 capsule (100 mg total) by mouth 2 (two) times daily as needed for mild constipation.  Marland Kitchen escitalopram (LEXAPRO) 5 MG tablet Take 15 mg by mouth daily. Give 3 tablets to = 15 mg  . flecainide (TAMBOCOR) 50 MG tablet Take 50 mg by mouth every 12 (twelve) hours.  . fluticasone (FLONASE) 50 MCG/ACT nasal spray Place 2  sprays into both nostrils daily.  Marland Kitchen gabapentin (NEURONTIN) 100 MG capsule Take 200 mg by mouth 3 (three) times daily.  Marland Kitchen HYDROcodone-acetaminophen (NORCO/VICODIN) 5-325 MG tablet Take 1 tablet by mouth every 12 (twelve) hours as needed for moderate pain.  . INCRUSE ELLIPTA 62.5 MCG/INH AEPB Inhale 1 puff into the lungs daily.   Marland Kitchen levalbuterol (XOPENEX) 1.25 MG/3ML nebulizer solution Take 1.25 mg by nebulization every 6 (six) hours as needed  for wheezing.   Marland Kitchen levothyroxine (SYNTHROID, LEVOTHROID) 25 MCG tablet Take 25 mcg by mouth daily before breakfast.  . loratadine (CLARITIN) 10 MG tablet Take 10 mg by mouth daily.  . magnesium hydroxide (MILK OF MAGNESIA) 400 MG/5ML suspension Take 30 mLs by mouth daily as needed for mild constipation.  . Multiple Vitamins-Minerals (MULTIVITAMIN WITH MINERALS) tablet Take 1 tablet by mouth daily.  . naphazoline-glycerin (CLEAR EYES REDNESS RELIEF) 0.012-0.2 % SOLN Place 1 drop into both eyes 2 (two) times daily.  Marland Kitchen NUTRITIONAL SUPPLEMENT LIQD Take 120 mLs by mouth 2 (two) times daily. MedPass  . ondansetron (ZOFRAN) 4 MG tablet Take 4 mg by mouth 2 (two) times daily.  . OXYGEN Inhale 2 L into the lungs at bedtime.  . pantoprazole (PROTONIX) 40 MG tablet Take 40 mg by mouth daily.  . polyethylene glycol (MIRALAX / GLYCOLAX) packet Take 17 g by mouth daily.  . predniSONE (DELTASONE) 20 MG tablet Take 20 mg by mouth 2 (two) times daily.  . primidone (MYSOLINE) 50 MG tablet Take 50-100 mg by mouth 2 (two) times daily. 1 tablet to = 50 mg QHS, 2 tablets to = 100 mg QAM  . Trolamine Salicylate (ASPERCREME EX) Apply 1 application topically 2 (two) times daily. Apply to posterior neck for muscle spasm  . [DISCONTINUED] traZODone (DESYREL) 50 MG tablet Take 50 mg by mouth at bedtime.    No facility-administered encounter medications on file as of 01/30/2018.     Review of Systems  GENERAL: No change in appetite, no fatigue, no weight changes, no fever, chills or weakness MOUTH and THROAT: Denies oral discomfort, gingival pain or bleeding, pain from teeth or hoarseness   RESPIRATORY: no cough, SOB, DOE, wheezing, hemoptysis CARDIAC: No chest pain, edema or palpitations GI: No abdominal pain, diarrhea, constipation, heart burn, nausea or vomiting NEUROLOGICAL: Denies dizziness, syncope, numbness, or headache PSYCHIATRIC: Denies feelings of depression or anxiety. No report of hallucinations, insomnia,  paranoia, or agitation   Immunization History  Administered Date(s) Administered  . Influenza, High Dose Seasonal PF 10/04/2016  . Influenza,inj,Quad PF,6+ Mos 01/13/2016  . Influenza-Unspecified 11/10/2017  . Pneumococcal-Unspecified 10/11/2011  . Tdap 01/23/2016   Pertinent  Health Maintenance Due  Topic Date Due  . PNA vac Low Risk Adult (2 of 2 - PCV13) 01/31/2019 (Originally 10/10/2012)  . INFLUENZA VACCINE  Completed  . DEXA SCAN  Discontinued   Fall Risk  01/17/2018 11/09/2017 07/14/2017 04/10/2017 11/08/2016  Falls in the past year? 0 No No Yes Yes  Number falls in past yr: 0 - - 2 or more 2 or more  Injury with Fall? 0 - - Yes Yes  Comment - - - - R hip, L ankle  Risk Factor Category  - - - High Fall Risk -  Follow up Falls evaluation completed - - Falls evaluation completed -      Vitals:   01/30/18 0819  BP: 134/78  Pulse: 82  Resp: 18  Temp: 98.7 F (37.1 C)  TempSrc: Oral  Weight: 144 lb 12.8 oz (  65.7 kg)  Height: 5\' 8"  (1.727 m)   Body mass index is 22.02 kg/m.  Physical Exam  GENERAL APPEARANCE: Well nourished. In no acute distress. Normal body habitus SKIN:  Skin is warm and dry.  MOUTH and THROAT: Lips are without lesions. Oral mucosa is moist and without lesions.  RESPIRATORY: Breathing is even & unlabored, BS CTAB CARDIAC: Irregularly irregular, no murmur, no extra heart sounds, no edema GI: Abdomen soft, normal BS, no masses, no tenderness EXTREMITIES:  Able to move X 4 extremities NEUROLOGICAL: + tremor. Speech is clear.  Alert and oriented X 3. PSYCHIATRIC: Affect and behavior are appropriate   Labs reviewed: Recent Labs    03/10/17 12/05/17  NA 144 141  K 4.4 5.1  BUN 14 16  CREATININE 1.2* 1.1   Recent Labs    03/10/17 12/05/17  AST 12* 12*  ALT 8 8  ALKPHOS 104 97   Recent Labs    03/10/17 04/10/17 12/05/17  WBC 5.8 5.8 6.2  NEUTROABS 3 4 4   HGB 11.0* 11.8* 10.5*  HCT 32* 36 31*  PLT 205 207 213   Lab Results  Component  Value Date   TSH 4.24 11/06/2017   Lab Results  Component Value Date   HGBA1C 5.2 12/01/2015   Lab Results  Component Value Date   CHOL 107 12/01/2015   HDL 27 (L) 12/01/2015   LDLCALC 59 12/01/2015   TRIG 107 12/01/2015   CHOLHDL 4.0 12/01/2015    Assessment/Plan  1. Simple chronic bronchitis (HCC) -No SOB nor wheezing, prednisone 20 mg 1 tab twice a day and Augmentin 875-125 mg 1 tab every 12 hours to complete 7 days today, Incruse Ellipta 62.5 mcg INH inhale 1 puff by mouth daily, Xopenex nebulization as needed, levalbuterol neb as needed  2. Tremor -Continue primidone 50 mg 2 tabs every morning and 1 tab nightly  3. SVT (supraventricular tachycardia) (HCC) - -continue digoxin 125 mcg 1 tab daily, flecainide 50 mg 1 tab every 12 hours  4. Depression, recurrent ([place]) -was seen putting make up on her face and smiling, continue Lexapro 5 mg 3 tabs = 15 mg nightly  5. Chronic pain syndrome -Continue gabapentin 100 mg 2 capsules = 200 mg 3 times a day and Norco 5-325 mg 1 tab every 12 hours as needed    Family/ staff Communication: Discussed plan of care with resident.  Labs/tests ordered:  None  Goals of care:   Long-term care.   Durenda Age, NP [facility] [phone] (Monday-Friday 8:00 a.m. - 5:00 p.m.) [phone] (after hours)

## 2018-02-01 ENCOUNTER — Other Ambulatory Visit: Payer: Self-pay | Admitting: Internal Medicine

## 2018-02-01 MED ORDER — HYDROCODONE-ACETAMINOPHEN 5-325 MG PO TABS: 1.0000 | ORAL_TABLET | Freq: Two times a day (BID) | ORAL | 0 refills | Status: DC | PRN

## 2018-02-01 NOTE — Telephone Encounter (Signed)
Refill request completed and pended to Dr. Linna Darner for approval.  [facility] is filling pharmacy.

## 2018-02-02 NOTE — Progress Notes (Signed)
CRP 42.75

## 2018-02-02 NOTE — Telephone Encounter (Signed)
This encounter was created in error - please disregard.

## 2018-02-16 DIAGNOSIS — F419 Anxiety disorder, unspecified: Secondary | ICD-10-CM | POA: Diagnosis not present

## 2018-02-16 DIAGNOSIS — G4700 Insomnia, unspecified: Secondary | ICD-10-CM | POA: Diagnosis not present

## 2018-02-16 DIAGNOSIS — F339 Major depressive disorder, recurrent, unspecified: Secondary | ICD-10-CM | POA: Diagnosis not present

## 2018-02-21 ENCOUNTER — Non-Acute Institutional Stay (SKILLED_NURSING_FACILITY): Payer: Medicare Other | Admitting: Adult Health

## 2018-02-21 ENCOUNTER — Encounter: Payer: Self-pay | Admitting: Adult Health

## 2018-02-21 DIAGNOSIS — I471 Supraventricular tachycardia: Secondary | ICD-10-CM

## 2018-02-21 DIAGNOSIS — F418 Other specified anxiety disorders: Secondary | ICD-10-CM | POA: Diagnosis not present

## 2018-02-21 DIAGNOSIS — E034 Atrophy of thyroid (acquired): Secondary | ICD-10-CM | POA: Diagnosis not present

## 2018-02-21 DIAGNOSIS — G8929 Other chronic pain: Secondary | ICD-10-CM

## 2018-02-21 DIAGNOSIS — J410 Simple chronic bronchitis: Secondary | ICD-10-CM | POA: Diagnosis not present

## 2018-02-21 DIAGNOSIS — R251 Tremor, unspecified: Secondary | ICD-10-CM | POA: Diagnosis not present

## 2018-02-21 NOTE — Progress Notes (Signed)
Location:  [facility] Room Number: 659-D Place of Service:  SNF (31) Provider:  Durenda Age, NP  Patient Care Team: [name], MD as PCP - General (Internal Medicine) Medina-Vargas, [name], NP as Nurse Practitioner (Internal Medicine)  Extended Emergency Contact Information Primary Emergency Contact: Hatfield,Lisa Address: [address]          [place], [place] [address] [place] Work Phone: [phone] Mobile Phone: [phone] Relation: Daughter  Code Status:  Full Code  Goals of care: Advanced Directive information Advanced Directives 11/09/2017  Does Patient Have a Medical Advance Directive? No  Type of Advance Directive -  Does patient want to make changes to medical advance directive? -  Would patient like information on creating a medical advance directive? No - Patient declined     Chief Complaint  Patient presents with  . Medical Management of Chronic Issues    Routine [facility] visit    HPI:  Pt is a 78 y.o. female seen today for medical management of chronic diseases.  She is a long-term care resident of [facility].  She has a PMH of COPD, anemia, hyperthyroidism, chronic pain syndrome, and depression. She was seen in her room today. No SOB nor wheezing.    Past Medical History:  Diagnosis Date  . COPD (chronic obstructive pulmonary disease) ([place])   . History of anxiety   . History of depression   . Spinal stenosis   . UTI (urinary tract infection)    Past Surgical History:  Procedure Laterality Date  . ANKLE FRACTURE SURGERY Left   . CATARACT EXTRACTION, BILATERAL    . FLEXIBLE SIGMOIDOSCOPY Left 11/18/2015   Procedure: FLEXIBLE SIGMOIDOSCOPY;  Surgeon: [name], MD;  Location: [facility];  Service: Endoscopy;  Laterality: Left;  . FLEXIBLE SIGMOIDOSCOPY N/A 11/20/2015   Procedure: FLEXIBLE SIGMOIDOSCOPY;  Surgeon: [name], MD;  Location: [facility] ENDOSCOPY;  Service:  Endoscopy;  Laterality: N/A;  . HIP ARTHROPLASTY Right 01/12/2016   Procedure: ARTHROPLASTY BIPOLAR HIP (HEMIARTHROPLASTY);  Surgeon: [name], MD;  Location: [facility];  Service: Orthopedics;  Laterality: Right;    Allergies  Allergen Reactions  . Biofreeze [Menthol (Topical Analgesic)] Rash    By history Aspercreme does not cause a rash    Outpatient Encounter Medications as of 02/21/2018  Medication Sig  . acetaminophen (TYLENOL) 325 MG tablet Take 2 tablets (650 mg total) by mouth every 8 (eight) hours as needed.  Marland Kitchen aspirin 81 MG chewable tablet Chew 81 mg by mouth 2 (two) times daily.  . bisacodyl (DULCOLAX) 10 MG suppository Place 10 mg rectally as needed for moderate constipation.  . busPIRone (BUSPAR) 5 MG tablet Take 5 mg by mouth 3 (three) times daily.   . cetirizine (ZYRTEC) 10 MG tablet Take 10 mg by mouth daily.  . digoxin (LANOXIN) 0.125 MG tablet Take 0.125 mg by mouth daily. Hold if HR <60  . docusate sodium (COLACE) 100 MG capsule Take 1 capsule (100 mg total) by mouth 2 (two) times daily as needed for mild constipation.  Marland Kitchen escitalopram (LEXAPRO) 5 MG tablet Take 15 mg by mouth daily. Give 3 tablets to = 15 mg  . flecainide (TAMBOCOR) 50 MG tablet Take 50 mg by mouth every 12 (twelve) hours.  . fluticasone (FLONASE) 50 MCG/ACT nasal spray Place 2 sprays into both nostrils daily.  Marland Kitchen gabapentin (NEURONTIN) 100 MG capsule Take 200 mg by mouth 3 (three) times daily.  Marland Kitchen HYDROcodone-acetaminophen (NORCO/VICODIN) 5-325 MG  tablet Take 1 tablet by mouth every 12 (twelve) hours as needed for moderate pain.  . INCRUSE ELLIPTA 62.5 MCG/INH AEPB Inhale 1 puff into the lungs daily.   Marland Kitchen levalbuterol (XOPENEX) 1.25 MG/3ML nebulizer solution Take 1.25 mg by nebulization every 6 (six) hours as needed for wheezing.   Marland Kitchen levothyroxine (SYNTHROID, LEVOTHROID) 25 MCG tablet Take 25 mcg by mouth daily before breakfast.  . magnesium hydroxide (MILK OF MAGNESIA) 400 MG/5ML suspension Take 30 mLs by  mouth daily as needed for mild constipation.  . Multiple Vitamins-Minerals (MULTIVITAMIN WITH MINERALS) tablet Take 1 tablet by mouth daily.  . naphazoline-glycerin (CLEAR EYES REDNESS RELIEF) 0.012-0.2 % SOLN Place 1 drop into both eyes 2 (two) times daily.  Marland Kitchen NUTRITIONAL SUPPLEMENT LIQD Take 120 mLs by mouth 2 (two) times daily. MedPass  . ondansetron (ZOFRAN) 4 MG tablet Take 4 mg by mouth 2 (two) times daily.  . OXYGEN Inhale 2 L into the lungs at bedtime.  . pantoprazole (PROTONIX) 40 MG tablet Take 40 mg by mouth daily.  . polyethylene glycol (MIRALAX / GLYCOLAX) packet Take 17 g by mouth daily.  . primidone (MYSOLINE) 50 MG tablet Take 50-100 mg by mouth 2 (two) times daily. 1 tablet to = 50 mg QHS, 2 tablets to = 100 mg QAM  . sennosides-docusate sodium (SENOKOT-S) 8.6-50 MG tablet Take 2 tablets by mouth at bedtime.  Loura Pardon Salicylate (ASPERCREME EX) Apply 1 application topically 2 (two) times daily. Apply to posterior neck for muscle spasm  . [DISCONTINUED] loratadine (CLARITIN) 10 MG tablet Take 10 mg by mouth daily.   No facility-administered encounter medications on file as of 02/21/2018.     Review of Systems  GENERAL: No change in appetite, no fatigue, no weight changes, no fever, chills or weakness MOUTH and THROAT: Denies oral discomfort, gingival pain or bleeding RESPIRATORY: no cough, SOB, DOE, wheezing, hemoptysis CARDIAC: No chest pain, edema or palpitations GI: No abdominal pain, diarrhea, constipation, heart burn, nausea or vomiting GU: Denies dysuria, frequency, hematuria, incontinence, or discharge NEUROLOGICAL: Denies dizziness, syncope, numbness, or headache PSYCHIATRIC: Denies feelings of depression or anxiety. No report of hallucinations, insomnia, paranoia, or agitation   Immunization History  Administered Date(s) Administered  . Influenza, High Dose Seasonal PF 10/04/2016  . Influenza,inj,Quad PF,6+ Mos 01/13/2016  . Influenza-Unspecified 11/10/2017    . Pneumococcal-Unspecified 10/11/2011  . Tdap 01/23/2016   Pertinent  Health Maintenance Due  Topic Date Due  . PNA vac Low Risk Adult (2 of 2 - PCV13) 01/31/2019 (Originally 10/10/2012)  . INFLUENZA VACCINE  Completed  . DEXA SCAN  Discontinued   Fall Risk  01/17/2018 11/09/2017 07/14/2017 04/10/2017 11/08/2016  Falls in the past year? 0 No No Yes Yes  Number falls in past yr: 0 - - 2 or more 2 or more  Injury with Fall? 0 - - Yes Yes  Comment - - - - R hip, L ankle  Risk Factor Category  - - - High Fall Risk -  Follow up Falls evaluation completed - - Falls evaluation completed -     Vitals:   02/21/18 0849  BP: 100/64  Pulse: 90  Resp: 16  Temp: (!) 97.5 F (36.4 C)  TempSrc: Oral  SpO2: 92%  Weight: 140 lb 3.2 oz (63.6 kg)  Height: 5\' 8"  (1.727 m)   Body mass index is 21.32 kg/m.  Physical Exam  GENERAL APPEARANCE: Well nourished. In no acute distress. Normal body habitus SKIN:  Skin is warm and dry.  MOUTH and THROAT: Lips are without lesions. Oral mucosa is moist and without lesions. Tongue is normal in shape, size, and color and without lesions RESPIRATORY: Breathing is even & unlabored, BS CTAB CARDIAC: Irregularly irregular, no murmur,no extra heart sounds, no edema GI: Abdomen soft, normal BS, no masses, no tenderness EXTREMITIES:  Able to move X 4 extremities NEUROLOGICAL: +tremor bilateral hands. Speech is clear. Alert and oriented X 3. PSYCHIATRIC:  Affect and behavior are appropriate  Labs reviewed: Recent Labs    03/10/17 12/05/17  NA 144 141  K 4.4 5.1  BUN 14 16  CREATININE 1.2* 1.1   Recent Labs    03/10/17 12/05/17  AST 12* 12*  ALT 8 8  ALKPHOS 104 97   Recent Labs    12/05/17 01/24/18 01/29/18  WBC 6.2 8.2 9.0  NEUTROABS 4 7 7   HGB 10.5* 9.7* 10.4*  HCT 31* 29* 30*  PLT 213 294 358   Lab Results  Component Value Date   TSH 4.24 11/06/2017   Lab Results  Component Value Date   HGBA1C 5.2 12/01/2015   Lab Results  Component  Value Date   CHOL 107 12/01/2015   HDL 27 (L) 12/01/2015   LDLCALC 59 12/01/2015   TRIG 107 12/01/2015   CHOLHDL 4.0 12/01/2015    Assessment/Plan  1. Paroxysmal junctional tachycardia (HCC) -Rate controlled, continue digoxin 125 mcg 1 tab daily, flecainide acetate 50 mg 1 tab every 12 hours,  2. Simple chronic bronchitis (HCC) -No SOB nor wheezing, continue levalbuterol as needed, Incruse Ellipta 62.5 mcg inhale 1 puff by mouth daily  3. Hypothyroidism due to acquired atrophy of thyroid Lab Results  Component Value Date   TSH 4.24 11/06/2017  -Continue levothyroxine 25 mcg 1 tab daily   4. Tremor -Stable, continue primidone 50 mg give 2 tabs every morning and at at bedtime  5. Depression with anxiety -Mood is a stable, continue buspirone 5 mg 1 tab 3 times a day and Lexapro 5 mg give 3 tabs = 15 mg at bedtime  6. Other chronic pain -Stable, continue acetaminophen 325 mg 2 tabs = 650 mg hours as needed every 12 hours as needed for pain and gabapentin 100 mg 2 capsules = 200 mg 3 times a day   Family/ staff Communication: Discussed plan of care with resident.  Labs/tests ordered:  None  Goals of care:   Long-term care.   Durenda Age, NP [facility] [phone] (Monday-Friday 8:00 a.m. - 5:00 p.m.) [phone] (after hours)

## 2018-03-01 ENCOUNTER — Non-Acute Institutional Stay (SKILLED_NURSING_FACILITY): Payer: Medicare Other | Admitting: Internal Medicine

## 2018-03-01 ENCOUNTER — Encounter: Payer: Self-pay | Admitting: Internal Medicine

## 2018-03-01 ENCOUNTER — Other Ambulatory Visit: Payer: Self-pay

## 2018-03-01 DIAGNOSIS — J410 Simple chronic bronchitis: Secondary | ICD-10-CM

## 2018-03-01 DIAGNOSIS — R0602 Shortness of breath: Secondary | ICD-10-CM

## 2018-03-01 DIAGNOSIS — M25551 Pain in right hip: Secondary | ICD-10-CM

## 2018-03-01 DIAGNOSIS — E039 Hypothyroidism, unspecified: Secondary | ICD-10-CM | POA: Diagnosis not present

## 2018-03-01 MED ORDER — HYDROCODONE-ACETAMINOPHEN 5-325 MG PO TABS: 1.0000 | ORAL_TABLET | Freq: Two times a day (BID) | ORAL | 0 refills | Status: DC | PRN

## 2018-03-01 NOTE — Procedures (Signed)
Lumbosacral Transforaminal Epidural Steroid Injection - Sub-Pedicular Approach with Fluoroscopic Guidance  Patient: Dawn [name]      Date of Birth: Aug 24, 1940 MRN: [id number] PCP: [name], MD      Visit Date: 12/29/2017   Universal Protocol:    Date/Time: 12/29/2017  Consent Given By: the patient  Position: PRONE  Additional Comments: Vital signs were monitored before and after the procedure. Patient was prepped and draped in the usual sterile fashion. The correct patient, procedure, and site was verified.   Injection Procedure Details:  Procedure Site One Meds Administered:  Meds ordered this encounter  Medications  . betamethasone acetate-betamethasone sodium phosphate (CELESTONE) injection 12 mg    Laterality: Right  Location/Site:  L3-L4  Needle size: 22 G  Needle type: Spinal  Needle Placement: Transforaminal  Findings:    -Comments: Excellent flow of contrast along the nerve and into the epidural space.  Procedure Details: After squaring off the end-plates to get a true AP view, the C-arm was positioned so that an oblique view of the foramen as noted above was visualized. The target area is just inferior to the "nose of the scotty dog" or sub pedicular. The soft tissues overlying this structure were infiltrated with 2-3 ml. of 1% Lidocaine without Epinephrine.  The spinal needle was inserted toward the target using a "trajectory" view along the fluoroscope beam.  Under AP and lateral visualization, the needle was advanced so it did not puncture dura and was located close the 6 O'Clock position of the pedical in AP tracterory. Biplanar projections were used to confirm position. Aspiration was confirmed to be negative for CSF and/or blood. A 1-2 ml. volume of Isovue-250 was injected and flow of contrast was noted at each level. Radiographs were obtained for documentation purposes.   After attaining the desired flow of contrast documented above, a  0.5 to 1.0 ml test dose of 0.25% Marcaine was injected into each respective transforaminal space.  The patient was observed for 90 seconds post injection.  After no sensory deficits were reported, and normal lower extremity motor function was noted,   the above injectate was administered so that equal amounts of the injectate were placed at each foramen (level) into the transforaminal epidural space.   Additional Comments:  The patient tolerated the procedure well Dressing: 2 x 2 sterile gauze and Band-Aid    Post-procedure details: Patient was observed during the procedure. Post-procedure instructions were reviewed.  Patient left the clinic in stable condition.

## 2018-03-01 NOTE — Progress Notes (Signed)
Dawn [name] - 78 y.o. female MRN [id number]  Date of birth: Mar 15, 1940  Office Visit Note: Visit Date: 12/29/2017 PCP: [name], MD Referred by: [name], MD  Subjective: Chief Complaint  Patient presents with  . Lower Back - Pain  . Right Hip - Pain  . Right Leg - Pain   HPI: Dawn [name] is a 78 y.o. female who comes in today For planned interventional spine procedure at the direction of Dr. Eduard Roux.  Have seen the patient in the past for sacroiliac joint injection which helped during the anesthetic phase but did not really help after that.  She has gone on to have an MRI performed at his request and this is reviewed with her today.  She does reside in a nursing facility.  She is been having severe right hip pain.  MRI shows multilevel degenerative changes significant for right-sided lateral recess narrowing at L2-3 which could affect the L3 nerve root without frank compression.  There is no high-grade central stenosis.  There is left-sided foraminal narrowing and facet arthropathy at L4-5.  This is left-sided however.  At L5-S1 there is bilateral facet arthropathy that could cause some referral pain as well.  We are going to complete a right L3 transforaminal injection and if this is not beneficial would look at facet joint block.  ROS Otherwise per HPI.  Assessment & Plan: Visit Diagnoses:  1. Lumbar radiculopathy     Plan: No additional findings.   Meds & Orders:  Meds ordered this encounter  Medications  . betamethasone acetate-betamethasone sodium phosphate (CELESTONE) injection 12 mg    Orders Placed This Encounter  Procedures  . XR C-ARM NO REPORT  . Epidural Steroid injection    Follow-up: Return for Eduard Roux, MD.   Procedures: No procedures performed  Lumbosacral Transforaminal Epidural Steroid Injection - Sub-Pedicular Approach with Fluoroscopic Guidance  Patient: Dawn [name]      Date of Birth: 12/19/1940 MRN:  [id number] PCP: [name], MD      Visit Date: 12/29/2017   Universal Protocol:    Date/Time: 12/29/2017  Consent Given By: the patient  Position: PRONE  Additional Comments: Vital signs were monitored before and after the procedure. Patient was prepped and draped in the usual sterile fashion. The correct patient, procedure, and site was verified.   Injection Procedure Details:  Procedure Site One Meds Administered:  Meds ordered this encounter  Medications  . betamethasone acetate-betamethasone sodium phosphate (CELESTONE) injection 12 mg    Laterality: Right  Location/Site:  L3-L4  Needle size: 22 G  Needle type: Spinal  Needle Placement: Transforaminal  Findings:    -Comments: Excellent flow of contrast along the nerve and into the epidural space.  Procedure Details: After squaring off the end-plates to get a true AP view, the C-arm was positioned so that an oblique view of the foramen as noted above was visualized. The target area is just inferior to the "nose of the scotty dog" or sub pedicular. The soft tissues overlying this structure were infiltrated with 2-3 ml. of 1% Lidocaine without Epinephrine.  The spinal needle was inserted toward the target using a "trajectory" view along the fluoroscope beam.  Under AP and lateral visualization, the needle was advanced so it did not puncture dura and was located close the 6 O'Clock position of the pedical in AP tracterory. Biplanar projections were used to confirm position. Aspiration was confirmed to be negative for CSF and/or blood. A  1-2 ml. volume of Isovue-250 was injected and flow of contrast was noted at each level. Radiographs were obtained for documentation purposes.   After attaining the desired flow of contrast documented above, a 0.5 to 1.0 ml test dose of 0.25% Marcaine was injected into each respective transforaminal space.  The patient was observed for 90 seconds post injection.  After no sensory  deficits were reported, and normal lower extremity motor function was noted,   the above injectate was administered so that equal amounts of the injectate were placed at each foramen (level) into the transforaminal epidural space.   Additional Comments:  The patient tolerated the procedure well Dressing: 2 x 2 sterile gauze and Band-Aid    Post-procedure details: Patient was observed during the procedure. Post-procedure instructions were reviewed.  Patient left the clinic in stable condition.    Clinical History: MRI LUMBAR SPINE WITHOUT CONTRAST  TECHNIQUE: Multiplanar, multisequence MR imaging of the lumbar spine was performed. No intravenous contrast was administered.  COMPARISON:  Lumbar radiographs 07/18/2017  FINDINGS: Segmentation:  Normal  Alignment: Moderate levoscoliosis. Mild retrolisthesis L2-3 and L3-4. 4 mm anterolisthesis L5-S1  Vertebrae:  Negative for fracture or mass.  Conus medullaris and cauda equina: Conus extends to the L2 level. Conus and cauda equina appear normal.  Paraspinal and other soft tissues: 6 cm left renal cyst. No paraspinous mass.  Disc levels:  T12-L1: Moderate disc degeneration and spurring with right foraminal stenosis  L1-2: Disc degeneration and spurring right greater than left with moderate right foraminal encroachment.  L2-3: Disc degeneration and diffuse bulging of the disc. Mild spinal stenosis. Moderate subarticular stenosis bilaterally right greater than left due to spurring.  L3-4: Disc degeneration and spondylosis with mild facet degeneration. Mild subarticular stenosis bilaterally  L4-5: Disc degeneration and spurring. Marked facet hypertrophy on the left. Severe subarticular foraminal stenosis on the left due to spurring.  L5-S1: Mild anterolisthesis. Bilateral facet degeneration with mild subarticular stenosis bilaterally.  IMPRESSION: Lumbar scoliosis with multilevel degenerative change  causing stenosis as described above. The most severe stenosis is on the right at L4-5. There is right foraminal stenosis at T12-L1 L1-2 and L2-3.   Electronically Signed   By: Franchot Gallo M.D.   On: 11/22/2017 16:16   She reports that she quit smoking about 2 years ago. She has never used smokeless tobacco. No results for input(s): HGBA1C, LABURIC in the last 8760 hours.  Objective:  VS:  HT:    WT:   BMI:     BP:123/68  HR:69bpm  TEMP:98 F (36.7 C)(Oral)  RESP:  Physical Exam  Ortho Exam Imaging: No results found.  Past Medical/Family/Surgical/Social History: Medications & Allergies reviewed per EMR, new medications updated. Patient Active Problem List   Diagnosis Date Noted  . Spinal stenosis 11/23/2017  . Headache 08/31/2017  . Elevated TSH 08/31/2017  . Pain in right hip 07/18/2017  . Right-sided low back pain with right-sided sciatica 07/18/2017  . Abnormal chest x-ray 04/04/2017  . Tremor 04/04/2017  . Bimalleolar ankle fracture, left, sequela 10/11/2016  . Altered mental status 10/03/2016  . Neuropathy 10/03/2016  . Chronic diastolic CHF (congestive heart failure) ([place]) 10/03/2016  . Acute lower UTI   . Recurrent falls 02/02/2016  . 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 Upper GI bleed 01/23/2016  . Blood loss anemia 01/23/2016  . Acute diastolic CHF (congestive heart failure) ([place]) 01/23/2016  . Heme + stool   . Abnormal CT scan, colon   . Paroxysmal junctional tachycardia ([place])   .  Hypoxia   . At risk for adverse drug reaction 01/18/2016  . Subcapital fracture of right hip, closed 01/12/2016  . Alteration consciousness 11/30/2015  . SVT (supraventricular tachycardia) ([place]) 11/27/2015  . Diarrhea 11/27/2015  . Depression, recurrent ([place]) 11/27/2015  . Polyneuropathy 11/27/2015  . Renal insufficiency   . Protein-calorie malnutrition, severe 11/18/2015  . Intra-abdominal fluid collection   . Premature atrial contractions   . Functional  diarrhea   . Sepsis ([place])   . Hyperthyroidism   . Hypotension   . Septic shock ([place])   . Acute encephalopathy   . Hypokalemia   . Acute delirium 11/10/2015  . AKI (acute kidney injury) ([place]) 11/10/2015  . Anemia, iron deficiency 11/10/2015  . Anxiety 11/10/2015  . Chronic pain syndrome 11/10/2015  . Recurrent UTI 11/10/2015  . Acute respiratory failure with hypoxia ([place]) 11/10/2015  . COPD (chronic obstructive pulmonary disease) ([place]) 11/10/2015  . Acute hyperglycemia 11/10/2015  . CKD (chronic kidney disease) stage 3, GFR 30-59 ml/min (HCC) 11/10/2015  . Narrow complex tachycardia ([place]) 11/10/2015  . HTN (hypertension) 11/10/2015  . Generalized weakness 11/10/2015  . Anemia   . Delirium   . Hypoxemia   . Tachycardia    Past Medical History:  Diagnosis Date  . COPD (chronic obstructive pulmonary disease) ([place])   . History of anxiety   . History of depression   . Spinal stenosis   . UTI (urinary tract infection)    Family History  Problem Relation Age of Onset  . Heart attack Mother   . Diabetes Father   . Breast cancer Sister   . Thyroid disease Neg Hx    Past Surgical History:  Procedure Laterality Date  . ANKLE FRACTURE SURGERY Left   . CATARACT EXTRACTION, BILATERAL    . FLEXIBLE SIGMOIDOSCOPY Left 11/18/2015   Procedure: FLEXIBLE SIGMOIDOSCOPY;  Surgeon: [name], MD;  Location: [facility];  Service: Endoscopy;  Laterality: Left;  . FLEXIBLE SIGMOIDOSCOPY N/A 11/20/2015   Procedure: FLEXIBLE SIGMOIDOSCOPY;  Surgeon: [name], MD;  Location: [facility];  Service: Endoscopy;  Laterality: N/A;  . HIP ARTHROPLASTY Right 01/12/2016   Procedure: ARTHROPLASTY BIPOLAR HIP (HEMIARTHROPLASTY);  Surgeon: [name], MD;  Location[facility];  Service: Orthopedics;  Laterality: Right;   Social History   Occupational History  . 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 Smoking status: Former Smoker    Last  attempt to quit: 12/11/2015    Years since quitting: 2.2  . Smokeless tobacco: Never Used  . Tobacco comment: Smoked age [age] up to < 1 ppd, usually half a pack per day, mainly one half pack per day  Substance and Sexual Activity  . Alcohol use: No  . Drug use: No  . Sexual activity: Not Currently

## 2018-03-01 NOTE — Progress Notes (Signed)
Location:    [facility] Room Number: 209/O Place of Service:  SNF [phone]) Provider:  Loraine Maple, MD  Patient Care Team: [name], MD as PCP - General (Internal Medicine) Medina-Vargas, [name], NP as Nurse Practitioner (Internal Medicine)  Extended Emergency Contact Information Primary Emergency Contact: Hatfield,Lisa Address: [address]          [place], [place] [address] [place] Work Phone: [phone] Mobile Phone: [phone] Relation: Daughter  Code Status:  Full Code Goals of care: Advanced Directive information Advanced Directives 03/01/2018  Does Patient Have a Medical Advance Directive? Yes  Type of Advance Directive (No Data)  Does patient want to make changes to medical advance directive? No - Patient declined  Would patient like information on creating a medical advance directive? -     Chief Complaint  Patient presents with  . Acute Visit    SOB  Patient complaining of intermittent shortness of -also complains of some right hip discomfort breath-  HPI:  Pt is a 78 y.o. female seen today for an acute visit for  Follow-up of shortness of breath.  She states this is somewhat intermittent and has been somewhat chronic more so possibly the last few days.  She does not complain of acute shortness of breath vital signs are stable oxygen saturations are in the 90s she does wear oxygen at night and apparently during the day at times.  She does have a history of COPD she was started on theLonhala inhaler earlier this week but says she just does not like it to her Incruse Ellipta--she also is Leva albuterol nebulizers as needed.  She also does have a history of some chronic back and leg pain she is actually seen orthopedics in the past and got a steroid injection for suspected degenerative disc disease with spinal stenosis still hurts somewhat-she is on Norco every 12 hours as needed  for pain and apparently this is helping she says currently she is not having pain but she apparently just recently took her Norco she also has a history of chronic pain and continues on Neurontin 200 mg 3 times daily as well.  Her other diagnoses include anemia hypothyroidism depression.   Past Medical History:  Diagnosis Date  . COPD (chronic obstructive pulmonary disease) ([facility])   . History of anxiety   . History of depression   . Spinal stenosis   . UTI (urinary tract infection)    Past Surgical History:  Procedure Laterality Date  . ANKLE FRACTURE SURGERY Left   . CATARACT EXTRACTION, BILATERAL    . FLEXIBLE SIGMOIDOSCOPY Left 11/18/2015   Procedure: FLEXIBLE SIGMOIDOSCOPY;  Surgeon: [name], MD;  Location: [facility];  Service: Endoscopy;  Laterality: Left;  . FLEXIBLE SIGMOIDOSCOPY N/A 11/20/2015   Procedure: FLEXIBLE SIGMOIDOSCOPY;  Surgeon: [name], MD;  [facility] ENDOSCOPY;  Service: Endoscopy;  Laterality: N/A;  . HIP ARTHROPLASTY Right 01/12/2016   Procedure: ARTHROPLASTY BIPOLAR HIP (HEMIARTHROPLASTY);  Surgeon: [name], MD;  Location: [facility];  Service: Orthopedics;  Laterality: Right;    Allergies  Allergen Reactions  . Biofreeze [Menthol (Topical Analgesic)] Rash    By history Aspercreme does not cause a rash    Outpatient Encounter Medications as of 03/01/2018  Medication Sig  . acetaminophen (TYLENOL) 325 MG tablet Take 2 tablets (650 mg total) by mouth every 8 (eight) hours as needed.  Marland Kitchen aspirin 81 MG chewable tablet Chew  81 mg by mouth 2 (two) times daily.  . busPIRone (BUSPAR) 5 MG tablet Take 5 mg by mouth 3 (three) times daily.   . cetirizine (ZYRTEC) 10 MG tablet Take 10 mg by mouth daily.  . digoxin (LANOXIN) 0.125 MG tablet Take 0.125 mg by mouth daily. Hold if HR <60  . docusate sodium (COLACE) 100 MG capsule Take 1 capsule (100 mg total) by mouth 2 (two) times daily as needed for mild constipation.  Marland Kitchen escitalopram (LEXAPRO) 5 MG tablet Take  15 mg by mouth daily. Give 3 tablets to = 15 mg  . flecainide (TAMBOCOR) 50 MG tablet Take 50 mg by mouth every 12 (twelve) hours.  . fluticasone (FLONASE) 50 MCG/ACT nasal spray Place 2 sprays into both nostrils daily as needed.   . gabapentin (NEURONTIN) 100 MG capsule Take 200 mg by mouth 3 (three) times daily.  . Glycopyrrolate (LONHALA MAGNAIR REFILL KIT) 25 MCG/ML SOLN Inhale into the lungs. Inhale 1 vial twice daily for COPD  . HYDROcodone-acetaminophen (NORCO/VICODIN) 5-325 MG tablet Take 1 tablet by mouth every 12 (twelve) hours as needed for moderate pain.  Marland Kitchen levalbuterol (XOPENEX) 1.25 MG/3ML nebulizer solution Take 1.25 mg by nebulization every 6 (six) hours as needed for wheezing.   Marland Kitchen levothyroxine (SYNTHROID, LEVOTHROID) 25 MCG tablet Take 25 mcg by mouth daily before breakfast.  . Multiple Vitamins-Minerals (MULTIVITAMIN WITH MINERALS) tablet Take 1 tablet by mouth daily.  . naphazoline-glycerin (CLEAR EYES REDNESS RELIEF) 0.012-0.2 % SOLN Place 1 drop into both eyes 2 (two) times daily.  Marland Kitchen NUTRITIONAL SUPPLEMENT LIQD Take 120 mLs by mouth 2 (two) times daily. MedPass  . ondansetron (ZOFRAN) 4 MG tablet Take 4 mg by mouth 2 (two) times daily.  . OXYGEN Inhale 2 L into the lungs at bedtime.  . pantoprazole (PROTONIX) 40 MG tablet Take 40 mg by mouth daily.  . polyethylene glycol (MIRALAX / GLYCOLAX) packet Take 17 g by mouth daily.  . primidone (MYSOLINE) 50 MG tablet Take 50-100 mg by mouth 2 (two) times daily. 1 tablet to = 50 mg QHS, 2 tablets to = 100 mg QAM  . sennosides-docusate sodium (SENOKOT-S) 8.6-50 MG tablet Take 2 tablets by mouth at bedtime.  Loura Pardon Salicylate (ASPERCREME EX) Apply 1 application topically 2 (two) times daily. Apply to posterior neck for muscle spasm  . [DISCONTINUED] bisacodyl (DULCOLAX) 10 MG suppository Place 10 mg rectally as needed for moderate constipation.  . [DISCONTINUED] INCRUSE ELLIPTA 62.5 MCG/INH AEPB Inhale 1 puff into the lungs daily.    . [DISCONTINUED] magnesium hydroxide (MILK OF MAGNESIA) 400 MG/5ML suspension Take 30 mLs by mouth daily as needed for mild constipation.   No facility-administered encounter medications on file as of 03/01/2018.     Review of Systems   In general she is not complaining of any fever or chills.  Skin does not complain of rashes or itching.  Head ears eyes nose mouth and throat is not complain of visual changes or sore throat.  Respiratory does have a history of COPD occasionally she will have some increased shortness of breath she is on oxygen intermittently does not complain of acute shortness of breath currently.--Does not really complain of shortness of breath beyond baseline   Cardiac is not complaining of any chest pain or significant edema.  GI is not complaining of abdominal pain vomiting diarrhea constipation.  GU is not complaining of dysuria.  Musculoskeletal does complain of some right hip discomfort there is some chronicity to this does  not complain of recent falls.  Neurologic does not complain of dizziness headache or numbness at this time.  And psych does not complain of being overtly depressed or anxious she is on BuSpar for anxiety and Lexapro for depression   Immunization History  Administered Date(s) Administered  . Influenza, High Dose Seasonal PF 10/04/2016  . Influenza,inj,Quad PF,6+ Mos 01/13/2016  . Influenza-Unspecified 11/10/2017  . Pneumococcal-Unspecified 10/11/2011  . Tdap 01/23/2016   Pertinent  Health Maintenance Due  Topic Date Due  . PNA vac Low Risk Adult (2 of 2 - PCV13) 01/31/2019 (Originally 10/10/2012)  . INFLUENZA VACCINE  Completed  . DEXA SCAN  Discontinued   Fall Risk  01/17/2018 11/09/2017 07/14/2017 04/10/2017 11/08/2016  Falls in the past year? 0 No No Yes Yes  Number falls in past yr: 0 - - 2 or more 2 or more  Injury with Fall? 0 - - Yes Yes  Comment - - - - R hip, L ankle  Risk Factor Category  - - - High Fall Risk -  Follow  up Falls evaluation completed - - Falls evaluation completed -   Functional Status Survey:    Vitals:   03/01/18 1124  Pulse: 69  SpO2: 96%  Blood pressure is 122/65- appears 97.0 update oxygen saturation is 99% room air  Physical Exam  General this is a pleasant elderly female no distress skin is warm and dry.  Eyes visual acuity appears to be intact sclera and conjunctive are clear.  Oropharynx is clear mucous membranes moist.  Heart is regular rate and rhythm without murmur gallop or rub she does not have significant lower extremity edema.  Abdomen is soft nontender with positive bowel sounds.  Musculoskeletal moves all extremities x4 she ambulates in the hallway and appears to do well with this using her wheelchair. She does not appear to have significant discomfort with gentle range of motion at the right  Hip area she says she just received Norco which is helping.   Neurologic is grossly intact her speech is clear no lateralizing findings.  Psych she is largely alert and oriented very pleasant and appropriate    Labs reviewed: Recent Labs    03/10/17 12/05/17  NA 144 141  K 4.4 5.1  BUN 14 16  CREATININE 1.2* 1.1   Recent Labs    03/10/17 12/05/17  AST 12* 12*  ALT 8 8  ALKPHOS 104 97   Recent Labs    12/05/17 01/24/18 01/29/18  WBC 6.2 8.2 9.0  NEUTROABS '4 7 7  ' HGB 10.5* 9.7* 10.4*  HCT 31* 29* 30*  PLT 213 294 358   Lab Results  Component Value Date   TSH 4.24 11/06/2017   Lab Results  Component Value Date   HGBA1C 5.2 12/01/2015   Lab Results  Component Value Date   CHOL 107 12/01/2015   HDL 27 (L) 12/01/2015   LDLCALC 59 12/01/2015   TRIG 107 12/01/2015   CHOLHDL 4.0 12/01/2015    Significant Diagnostic Results in last 30 days:  No results found.  Assessment/Plan  #1 history of shortness of breath at this point she appears to be stable she would like to be back on her Ellipta and will do so and will DC theLonhala per patient  request.  She does continue on PRN nebulizers- will chest x-ray to rule out any possible infiltrate although my suspicion is fairly low here.  Also will order a BNP to assess for any possible CHF etiology.  Clinically  however she appears to be stable ambulating in the wheelchair.  2 history of right hip discomfort-again she does have an orthopedic history here with previous history of steroid injections by orthopedics apparently with limited relief we will reorder an orthopedic follow-up as well as obtain a hip x-ray-- she does receive Norco for pain she is also on Neurontin with a history of chronic pain  #3 history of SVT this appears rate controlled on digoxin--will  update digoxin level she is also onFlecainide. .  4.  Intermittent occasional systolics in the 72Z apparently this is well-tolerated she is not on any blood pressure medication continue to monitor also will update her TSH was at 4.24 back in October    5  hypothyroidism continues on Synthroid as noted above.  [id number]

## 2018-03-02 DIAGNOSIS — I4891 Unspecified atrial fibrillation: Secondary | ICD-10-CM | POA: Diagnosis not present

## 2018-03-02 DIAGNOSIS — D649 Anemia, unspecified: Secondary | ICD-10-CM | POA: Diagnosis not present

## 2018-03-02 DIAGNOSIS — I5032 Chronic diastolic (congestive) heart failure: Secondary | ICD-10-CM | POA: Diagnosis not present

## 2018-03-02 DIAGNOSIS — R0602 Shortness of breath: Secondary | ICD-10-CM | POA: Diagnosis not present

## 2018-03-02 DIAGNOSIS — L129 Pemphigoid, unspecified: Secondary | ICD-10-CM | POA: Diagnosis not present

## 2018-03-02 DIAGNOSIS — D509 Iron deficiency anemia, unspecified: Secondary | ICD-10-CM | POA: Diagnosis not present

## 2018-03-02 DIAGNOSIS — E039 Hypothyroidism, unspecified: Secondary | ICD-10-CM | POA: Diagnosis not present

## 2018-03-02 LAB — BASIC METABOLIC PANEL WITH GFR
BUN: 11 (ref 4–21)
Creatinine: 1 (ref 0.5–1.1)
Glucose: 87
Potassium: 4.5 (ref 3.4–5.3)
Sodium: 141 (ref 137–147)

## 2018-03-02 LAB — TSH: TSH: 4.17 (ref 0.41–5.90)

## 2018-03-07 DIAGNOSIS — F331 Major depressive disorder, recurrent, moderate: Secondary | ICD-10-CM | POA: Diagnosis not present

## 2018-03-07 DIAGNOSIS — R630 Anorexia: Secondary | ICD-10-CM | POA: Diagnosis not present

## 2018-03-07 DIAGNOSIS — F419 Anxiety disorder, unspecified: Secondary | ICD-10-CM | POA: Diagnosis not present

## 2018-03-07 DIAGNOSIS — G4700 Insomnia, unspecified: Secondary | ICD-10-CM | POA: Diagnosis not present

## 2018-03-12 DIAGNOSIS — B351 Tinea unguium: Secondary | ICD-10-CM | POA: Diagnosis not present

## 2018-03-12 DIAGNOSIS — I739 Peripheral vascular disease, unspecified: Secondary | ICD-10-CM | POA: Diagnosis not present

## 2018-03-12 DIAGNOSIS — L603 Nail dystrophy: Secondary | ICD-10-CM | POA: Diagnosis not present

## 2018-03-20 ENCOUNTER — Other Ambulatory Visit: Payer: Self-pay

## 2018-03-20 MED ORDER — HYDROCODONE-ACETAMINOPHEN 5-325 MG PO TABS: 1.0000 | ORAL_TABLET | Freq: Two times a day (BID) | ORAL | 0 refills | Status: DC | PRN

## 2018-03-21 ENCOUNTER — Ambulatory Visit (INDEPENDENT_AMBULATORY_CARE_PROVIDER_SITE_OTHER): Payer: Medicare Other | Admitting: Orthopaedic Surgery

## 2018-03-21 ENCOUNTER — Other Ambulatory Visit: Payer: Self-pay

## 2018-03-21 ENCOUNTER — Encounter (INDEPENDENT_AMBULATORY_CARE_PROVIDER_SITE_OTHER): Payer: Self-pay | Admitting: Physician Assistant

## 2018-03-21 DIAGNOSIS — M5441 Lumbago with sciatica, right side: Secondary | ICD-10-CM | POA: Diagnosis not present

## 2018-03-21 DIAGNOSIS — G8929 Other chronic pain: Secondary | ICD-10-CM | POA: Diagnosis not present

## 2018-03-21 NOTE — Progress Notes (Signed)
Office Visit Note   Patient: Dawn [name]           Date of Birth: 11/11/1940           MRN: [id number] Visit Date: 03/21/2018              Requested by: [name], [place], [place] [address] PCP: [name], MD   Assessment & Plan: Visit Diagnoses:  1. Chronic right-sided low back pain with right-sided sciatica     Plan: Impression is multilevel spondylosis with facet arthropathy.  We will refer the patient back to Dr. Ernestina Patches for possible facet joint block.  I discussed with the patient that I do not think this is coming from her hip joint at all.  She will follow-up with [place] as needed.  Follow-Up Instructions: Return if symptoms worsen or fail to improve.   Orders:  Orders Placed This Encounter  Procedures  . Ambulatory referral to Physical Medicine Rehab   No orders of the defined types were placed in this encounter.     Procedures: No procedures performed   Clinical Data: No additional findings.   Subjective: Chief Complaint  Patient presents with  . Right Hip - Pain    HPI patient is a pleasant 78 year old female who resides at [facility] who comes in today with continued right lower back pain.  History of multilevel degenerative changes significant for right sided lateral recess narrowing at L2-3 as well as bilateral facet arthropathy at L5-S1.  She has had previous SI joint injections by Dr. Ernestina Patches with significant relief of symptoms, but only lasted during the anesthetic phase.  She most recently underwent an L3 transforaminal injection on 12/29/2017.  She is unsure whether this helped at all.  He comes in with continued right lower back pain.  Pain is described as constant and worse with lifting the right leg.  She describes any numbness, tingling or burning.  No significant weakness.  No new bowel or bladder change or saddle paresthesias.  She denies any groin or anterior thigh pain.  She is status post right  hemi-hip arthroplasty by Dr. Ihor Gully in 2018.  Review of Systems as detailed in HPI.  All others reviewed and are negative.   Objective: Vital Signs: There were no vitals taken for this visit.  Physical Exam well-developed well-nourished female no acute distress.  Alert and oriented x3.  Ortho Exam examination of the right hip has a negative logroll.  No tenderness over the greater trochanter.  Markedly positive straight leg raise.  She does have moderate paraspinous tenderness on the right lower lumbar spine into the SI joint.  She does not have increased pain with lumbar flexion or extension.  No focal weakness.  She does have trouble with hip flexion secondary to pain.  He is neurovascularly intact distally.  Specialty Comments:  No specialty comments available.  Imaging: No new imaging   PMFS History: Patient Active Problem List   Diagnosis Date Noted  . Chronic right-sided low back pain with right-sided sciatica 03/21/2018  . Spinal stenosis 11/23/2017  . Headache 08/31/2017  . Elevated TSH 08/31/2017  . Pain in right hip 07/18/2017  . Right-sided low back pain with right-sided sciatica 07/18/2017  . Abnormal chest x-ray 04/04/2017  . Tremor 04/04/2017  . Bimalleolar ankle fracture, left, sequela 10/11/2016  . Altered mental status 10/03/2016  . Neuropathy 10/03/2016  . Chronic diastolic CHF (congestive heart failure) ([place]) 10/03/2016  . Acute  lower UTI   . Recurrent falls 02/02/2016  . 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 Upper GI bleed 01/23/2016  . Blood loss anemia 01/23/2016  . Acute diastolic CHF (congestive heart failure) ([place] [facility]) 01/23/2016  . Heme + stool   . Abnormal CT scan, colon   . Paroxysmal junctional tachycardia ([place])   . Hypoxia   . At risk for adverse drug reaction 01/18/2016  . Subcapital fracture of right hip, closed 01/12/2016  . Alteration consciousness 11/30/2015  . SVT (supraventricular tachycardia) ([place]) 11/27/2015  . Diarrhea 11/27/2015   . Depression, recurrent ([place]) 11/27/2015  . Polyneuropathy 11/27/2015  . Renal insufficiency   . Protein-calorie malnutrition, severe 11/18/2015  . Intra-abdominal fluid collection   . Premature atrial contractions   . Functional diarrhea   . Sepsis ([place])   . Hyperthyroidism   . Hypotension   . Septic shock ([place])   . Acute encephalopathy   . Hypokalemia   . Acute delirium 11/10/2015  . AKI (acute kidney injury) ([facility]) 11/10/2015  . Anemia, iron deficiency 11/10/2015  . Anxiety 11/10/2015  . Chronic pain syndrome 11/10/2015  . Recurrent UTI 11/10/2015  . Acute respiratory failure with hypoxia ([place]) 11/10/2015  . COPD (chronic obstructive pulmonary disease) ([place]) 11/10/2015  . Acute hyperglycemia 11/10/2015  . CKD (chronic kidney disease) stage 3, GFR 30-59 ml/min ([facility]) 11/10/2015  . Narrow complex tachycardia ([place]) 11/10/2015  . HTN (hypertension) 11/10/2015  . Generalized weakness 11/10/2015  . Anemia   . Delirium   . Hypoxemia   . Tachycardia    Past Medical History:  Diagnosis Date  . COPD (chronic obstructive pulmonary disease) ([place])   . History of anxiety   . History of depression   . Spinal stenosis   . UTI (urinary tract infection)     Family History  Problem Relation Age of Onset  . Heart attack Mother   . Diabetes Father   . Breast cancer Sister   . Thyroid disease Neg Hx     Past Surgical History:  Procedure Laterality Date  . ANKLE FRACTURE SURGERY Left   . CATARACT EXTRACTION, BILATERAL    . FLEXIBLE SIGMOIDOSCOPY Left 11/18/2015   Procedure: FLEXIBLE SIGMOIDOSCOPY;  Surgeon: [name], MD;  Location: [facility];  Service: Endoscopy;  Laterality: Left;  . FLEXIBLE SIGMOIDOSCOPY N/A 11/20/2015   Procedure: FLEXIBLE SIGMOIDOSCOPY;  Surgeon: [name], MD;  Location: [facility] ENDOSCOPY;  Service: Endoscopy;  Laterality: N/A;  . HIP ARTHROPLASTY Right 01/12/2016   Procedure: ARTHROPLASTY BIPOLAR HIP (HEMIARTHROPLASTY);  Surgeon: [name], MD;  [facility];  Service: Orthopedics;  Laterality: Right;   Social History   Occupational History  . 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 Smoking status: Former Smoker    Last attempt to quit: 12/11/2015    Years since quitting: 2.2  . Smokeless tobacco: Never Used  . Tobacco comment: Smoked age [age] up to < 1 ppd, usually half a pack per day, mainly one half pack per day  Substance and Sexual Activity  . Alcohol use: No  . Drug use: No  . Sexual activity: Not Currently

## 2018-03-22 DIAGNOSIS — F419 Anxiety disorder, unspecified: Secondary | ICD-10-CM | POA: Diagnosis not present

## 2018-03-22 DIAGNOSIS — R630 Anorexia: Secondary | ICD-10-CM | POA: Diagnosis not present

## 2018-03-22 DIAGNOSIS — G4700 Insomnia, unspecified: Secondary | ICD-10-CM | POA: Diagnosis not present

## 2018-03-22 DIAGNOSIS — F331 Major depressive disorder, recurrent, moderate: Secondary | ICD-10-CM | POA: Diagnosis not present

## 2018-03-26 LAB — BASIC METABOLIC PANEL  EGFR
Calcium: 8.2
Carbon Dioxide, Total: 29
Chloride: 102

## 2018-03-26 LAB — DIGOXIN LEVEL: Digoxin: 0.9

## 2018-03-26 LAB — PRO B NATRIURETIC PEPTIDE: B-Type Natriuretic Peptide: 417.8

## 2018-04-03 DIAGNOSIS — M25512 Pain in left shoulder: Secondary | ICD-10-CM | POA: Diagnosis not present

## 2018-04-03 DIAGNOSIS — G629 Polyneuropathy, unspecified: Secondary | ICD-10-CM | POA: Diagnosis not present

## 2018-04-03 DIAGNOSIS — M6281 Muscle weakness (generalized): Secondary | ICD-10-CM | POA: Diagnosis not present

## 2018-04-04 DIAGNOSIS — G629 Polyneuropathy, unspecified: Secondary | ICD-10-CM | POA: Diagnosis not present

## 2018-04-04 DIAGNOSIS — M25512 Pain in left shoulder: Secondary | ICD-10-CM | POA: Diagnosis not present

## 2018-04-04 DIAGNOSIS — M6281 Muscle weakness (generalized): Secondary | ICD-10-CM | POA: Diagnosis not present

## 2018-04-05 ENCOUNTER — Non-Acute Institutional Stay (SKILLED_NURSING_FACILITY): Payer: Medicare Other | Admitting: Internal Medicine

## 2018-04-05 ENCOUNTER — Encounter: Payer: Self-pay | Admitting: Internal Medicine

## 2018-04-05 DIAGNOSIS — G894 Chronic pain syndrome: Secondary | ICD-10-CM

## 2018-04-05 DIAGNOSIS — G8929 Other chronic pain: Secondary | ICD-10-CM | POA: Diagnosis not present

## 2018-04-05 DIAGNOSIS — R251 Tremor, unspecified: Secondary | ICD-10-CM | POA: Diagnosis not present

## 2018-04-05 DIAGNOSIS — J410 Simple chronic bronchitis: Secondary | ICD-10-CM | POA: Diagnosis not present

## 2018-04-05 DIAGNOSIS — F339 Major depressive disorder, recurrent, unspecified: Secondary | ICD-10-CM | POA: Diagnosis not present

## 2018-04-05 DIAGNOSIS — G629 Polyneuropathy, unspecified: Secondary | ICD-10-CM | POA: Diagnosis not present

## 2018-04-05 DIAGNOSIS — M25512 Pain in left shoulder: Secondary | ICD-10-CM | POA: Diagnosis not present

## 2018-04-05 DIAGNOSIS — R7989 Other specified abnormal findings of blood chemistry: Secondary | ICD-10-CM | POA: Diagnosis not present

## 2018-04-05 DIAGNOSIS — M5441 Lumbago with sciatica, right side: Secondary | ICD-10-CM | POA: Diagnosis not present

## 2018-04-05 DIAGNOSIS — M6281 Muscle weakness (generalized): Secondary | ICD-10-CM | POA: Diagnosis not present

## 2018-04-05 NOTE — Progress Notes (Signed)
Location:    [facility] Room Number: 304/A Place of Service:  SNF [phone]) Provider:  Granville Lewis PA-C  [name], MD  Patient Care Team: [name], MD as PCP - General (Internal Medicine) Medina-Vargas, [name], NP as Nurse Practitioner (Internal Medicine)  Extended Emergency Contact Information Primary Emergency Contact: Hatfield,Lisa Address: [address]          [place], [place] [address] [place] Work Phone: [phone] Mobile Phone: [phone] Relation: Daughter  Code Status:  Full Code Goals of care: Advanced Directive information Advanced Directives 04/05/2018  Does Patient Have a Medical Advance Directive? Yes  Type of Advance Directive (No Data)  Does patient want to make changes to medical advance directive? No - Patient declined  Would patient like information on creating a medical advance directive? -     Chief Complaint  Patient presents with  . Medical Management of Chronic Issues    Routine visit of medical management   Medical management of chronic medical conditions including COPD on chronic oxygen- chronic pain-atrial fibrillation-history of tremors- allergic rhinitis- hypothyroidism-depression.  HPI:  Pt is a 78 y.o. female seen today for medical management of chronic diseases as noted above.  Most recent acute issue was what she described as right hip pain and she did have an orthopedic consult-.  Per review of consult note impression was multilevel spondylosis with facet arthropathy and reference was back to Dr. Ernestina Patches for a possible facet joint block which apparently she is received in the past.  It was thought the pain was not really coming from her hip.  Apparently a reconsult with Dr. Ernestina Patches is pending.  She continues to complain of some pain in this regard she does have a history of chronic pain-and is on Tylenol 650 mg every 12 hours as needed as well as Neurontin 200 mg 3  times daily routine- and Norco 5-3 25 1  tab every 12 hours as needed.  In regards to COPD she is on oxygen she continues on Levalburerol--as well as Incruse Ellipta.  She says her breathing remains at baseline.  She also has a history of hypothyroidism she is on 25 mcg of Synthroid TSH was 4.7 on lab done on February 21.  In regards to depression she continues on numerous agents including Remeron 15 mg a day as well as Lexapro 50 mg a day and BuSpar 3 times daily 5 mg twice daily and then 10 mg at night.  She has a history of atrial fibrillation which appears to be rate controlled on digoxin 125 as well as flecainide 50 mg every 12 hours.  She also has a history of tremors which appears moderately controlled on primidone 50 mg 2 tabs in the a.m. 1 tab in the p.m.  Currently she is sitting in her wheelchair comfortably but continues to complain at times of some hip discomfort   Past Medical History:  Diagnosis Date  . COPD (chronic obstructive pulmonary disease) ([place])   . History of anxiety   . History of depression   . Spinal stenosis   . UTI (urinary tract infection)    Past Surgical History:  Procedure Laterality Date  . ANKLE FRACTURE SURGERY Left   . CATARACT EXTRACTION, BILATERAL    . FLEXIBLE SIGMOIDOSCOPY Left 11/18/2015   Procedure: FLEXIBLE SIGMOIDOSCOPY;  Surgeon: [name], MD;  Location: [facility];  Service: Endoscopy;  Laterality: Left;  . FLEXIBLE SIGMOIDOSCOPY N/A 11/20/2015  Procedure: FLEXIBLE SIGMOIDOSCOPY;  Surgeon: [name], MD;  Location: [facility] ENDOSCOPY;  Service: Endoscopy;  Laterality: N/A;  . HIP ARTHROPLASTY Right 01/12/2016   Procedure: ARTHROPLASTY BIPOLAR HIP (HEMIARTHROPLASTY);  Surgeon: [name], MD;  Location: [facility];  Service: Orthopedics;  Laterality: Right;    Allergies  Allergen Reactions  . Biofreeze [Menthol (Topical Analgesic)] Rash    By history Aspercreme does not cause a rash    Allergies as of 04/05/2018      Reactions    Biofreeze [menthol (topical Analgesic)] Rash   By history Aspercreme does not cause a rash      Medication List       Accurate as of April 05, 2018  3:27 PM. Always use your most recent med list.        acetaminophen 325 MG tablet Commonly known as:  TYLENOL Take 2 tablets (650 mg total) by mouth every 8 (eight) hours as needed.   ASPERCREME EX Apply 1 application topically 2 (two) times daily. Apply to posterior neck for muscle spasm   aspirin 81 MG chewable tablet Chew 81 mg by mouth 2 (two) times daily.   busPIRone 5 MG tablet Commonly known as:  BUSPAR Take 5 mg by mouth 2 (two) times daily.   busPIRone 10 MG tablet Commonly known as:  BUSPAR Take 10 mg by mouth daily. FOR ANXIETY   cetirizine 10 MG tablet Commonly known as:  ZYRTEC Take 10 mg by mouth daily.   Clear Eyes Redness Relief 0.012-0.2 % Soln Generic drug:  naphazoline-glycerin Place 1 drop into both eyes 2 (two) times daily.   digoxin 0.125 MG tablet Commonly known as:  LANOXIN Take 0.125 mg by mouth daily. Hold if HR <60   docusate sodium 100 MG capsule Commonly known as:  COLACE Take 1 capsule (100 mg total) by mouth 2 (two) times daily as needed for mild constipation.   escitalopram 5 MG tablet Commonly known as:  LEXAPRO Take 15 mg by mouth daily. Give 3 tablets to = 15 mg   flecainide 50 MG tablet Commonly known as:  TAMBOCOR Take 50 mg by mouth every 12 (twelve) hours.   fluticasone 50 MCG/ACT nasal spray Commonly known as:  FLONASE Place 2 sprays into both nostrils daily as needed.   gabapentin 100 MG capsule Commonly known as:  NEURONTIN Take 200 mg by mouth 3 (three) times daily.   HYDROcodone-acetaminophen 5-325 MG tablet Commonly known as:  NORCO/VICODIN Take 1 tablet by mouth every 12 (twelve) hours as needed for moderate pain. T   Incruse Ellipta 62.5 MCG/INH Aepb Generic drug:  umeclidinium bromide Inhale 1 puff into the lungs daily. (SHAKE WELL)   levalbuterol 1.25  MG/3ML nebulizer solution Commonly known as:  XOPENEX Take 1.25 mg by nebulization every 6 (six) hours as needed for wheezing.   levothyroxine 25 MCG tablet Commonly known as:  SYNTHROID, LEVOTHROID Take 25 mcg by mouth daily before breakfast.   mirtazapine 15 MG tablet Commonly known as:  REMERON Take 15 mg by mouth at bedtime. FOR DEPRESSION AND APPETITE   multivitamin with minerals tablet Take 1 tablet by mouth daily.   Nutritional Supplement Liqd Take 120 mLs by mouth 2 (two) times daily. MedPass   ondansetron 4 MG tablet Commonly known as:  ZOFRAN Take 4 mg by mouth 2 (two) times daily.   OXYGEN Inhale 2 L into the lungs at bedtime.   pantoprazole 40 MG tablet Commonly known as:  PROTONIX Take 40 mg by mouth daily.  polyethylene glycol packet Commonly known as:  MIRALAX / GLYCOLAX Take 17 g by mouth daily.   primidone 50 MG tablet Commonly known as:  MYSOLINE TAKE 2 TABLETS (100MG ) BY MOUTH EVERY MORNING;TAKE 1 TABLET BY MOUTH AT BEDTIME   sennosides-docusate sodium 8.6-50 MG tablet Commonly known as:  SENOKOT-S Take 2 tablets by mouth at bedtime.       Review of Systems   General she is not complaining of any fever chills.  Weight appears to be stable.  Skin does not complain of rashes or itching.  Head ears eyes nose mouth and throat does not complain of visual changes or sore throat says at times her sense of taste is not very good.  Respiratory says her breathing is at baseline is not complaining of any active wheezing or increased shortness of breath.  Cardiac does not complain of chest pain or edema.  GI is not complaining of abdominal discomfort nausea vomiting diarrhea constipation says at times her appetite is not good.  GU does not complain of dysuria.  Musculoskeletal continues to complain at times of some hip discomfort as noted above.  Neurologic is not complaining of dizziness headache numbness does have tremors.  Psych does have a  history of depression and anxiety as noted above this appears relatively stable.       Immunization History  Administered Date(s) Administered  . Influenza, High Dose Seasonal PF 10/04/2016  . Influenza,inj,Quad PF,6+ Mos 01/13/2016  . Influenza-Unspecified 11/10/2017  . Pneumococcal-Unspecified 10/11/2011  . Tdap 01/23/2016   Pertinent  Health Maintenance Due  Topic Date Due  . PNA vac Low Risk Adult (2 of 2 - PCV13) 01/31/2019 (Originally 10/10/2012)  . INFLUENZA VACCINE  Completed  . DEXA SCAN  Discontinued   Fall Risk  01/17/2018 11/09/2017 07/14/2017 04/10/2017 11/08/2016  Falls in the past year? 0 No No Yes Yes  Number falls in past yr: 0 - - 2 or more 2 or more  Injury with Fall? 0 - - Yes Yes  Comment - - - - R hip, L ankle  Risk Factor Category  - - - High Fall Risk -  Follow up Falls evaluation completed - - Falls evaluation completed -   Functional Status Survey:    Vitals:   04/05/18 1449  BP: (!) 107/52  Pulse: 69  Resp: 20  Temp: (!) 97.2 F (36.2 C)  TempSrc: Oral  SpO2: 97%  Weight: 141 lb 6.4 oz (64.1 kg)  Height: 5\' 8"  (1.727 m)   Body mass index is 21.5 kg/m. Physical Exam  General this is a pleasant elderly female in no distress.  Her skin is warm and dry.  Eyes visual acuity appears to be intact sclera and conjunctive are clear.  Oropharynx is clear mucous membranes moist.  Chest chest is largely clear to auscultation with minimal bronchial sounds there is no labored breathing-air entry is somewhat shallow.  Heart is regular rate and rhythm without murmur gallop or rub she does not have significant lower extremity edema.  Musculoskeletal strength appears to be intact all 4 extremities she does ambulate well in her wheelchair.  Neurologic is grossly intact her speech is clear no lateralizing findings she does have tremors of her hands bilaterally.  Psych she is alert and oriented pleasant and appropriate   Labs reviewed: Recent Labs     12/05/17 03/02/18  NA 141 141  K 5.1 4.5  CL  --  102  CO2  --  29  BUN 16  11  CREATININE 1.1 1.0  CALCIUM  --  8.2   Recent Labs    12/05/17  AST 12*  ALT 8  ALKPHOS 97   Recent Labs    12/05/17 01/24/18 01/29/18  WBC 6.2 8.2 9.0  NEUTROABS 4 7 7   HGB 10.5* 9.7* 10.4*  HCT 31* 29* 30*  PLT 213 294 358   Lab Results  Component Value Date   TSH 4.17 03/02/2018   Lab Results  Component Value Date   HGBA1C 5.2 12/01/2015   Lab Results  Component Value Date   CHOL 107 12/01/2015   HDL 27 (L) 12/01/2015   LDLCALC 59 12/01/2015   TRIG 107 12/01/2015   CHOLHDL 4.0 12/01/2015    Significant Diagnostic Results in last 30 days:  No results found.  Assessment/Plan  #1 history of right hip pain again this was thought possibly to be more lumbar radiculopathy orthopedics she will need follow-up by Dr. Ernestina Patches for a possible nerve block at this point she does have fairly extensive pain medication including Tylenol every 12 hours as needed as well as Neurontin 200 mg 3 times daily-and Norco 5-3 25 1  tab every 12 hours as needed.  We are trying to minimize her narcotic use.  At this point she does not appear to be in acute discomfort but will monitor and hopefully she will be seeing Dr. Ernestina Patches expediently.  2.  History of chronic pain please see above.  3.  History of COPD continues on Leva albuterol as well as Incruse Ellipta  She is on oxygen this appears to be stable does not complain of increased shortness of breath or wheezing.  4.  History of hypothyroidism TSH is 4.7 on lab done in February she is on 25 mcg of Synthroid will monitor periodically.  5.  History of depression she is on numerous medications including Remeron 50 mg a day-Lexapro 50 mg a day as well as BuSpar 3 times a day- this appears to be well-tolerated and appears to be stable at this point.  #6 history of tremors continues on primidone 2 times a day.  7.  History of allergic rhinitis she is on  Flonase as well as Zyrtec this appears well controlled.  8.  History of GI bleed in the past no recent evidence she is on a proton pump inhibitor- hemoglobin has shown stability it was 10.4 on lab done last month  #9 history of A. fib this appears rate controlled on digoxin as well as flecainide- she is on aspirin for anticoagulation.  [id number]

## 2018-04-06 DIAGNOSIS — G629 Polyneuropathy, unspecified: Secondary | ICD-10-CM | POA: Diagnosis not present

## 2018-04-06 DIAGNOSIS — M25512 Pain in left shoulder: Secondary | ICD-10-CM | POA: Diagnosis not present

## 2018-04-06 DIAGNOSIS — M6281 Muscle weakness (generalized): Secondary | ICD-10-CM | POA: Diagnosis not present

## 2018-04-09 DIAGNOSIS — M6281 Muscle weakness (generalized): Secondary | ICD-10-CM | POA: Diagnosis not present

## 2018-04-09 DIAGNOSIS — M25512 Pain in left shoulder: Secondary | ICD-10-CM | POA: Diagnosis not present

## 2018-04-09 DIAGNOSIS — G629 Polyneuropathy, unspecified: Secondary | ICD-10-CM | POA: Diagnosis not present

## 2018-04-10 DIAGNOSIS — M6281 Muscle weakness (generalized): Secondary | ICD-10-CM | POA: Diagnosis not present

## 2018-04-10 DIAGNOSIS — G629 Polyneuropathy, unspecified: Secondary | ICD-10-CM | POA: Diagnosis not present

## 2018-04-10 DIAGNOSIS — M25512 Pain in left shoulder: Secondary | ICD-10-CM | POA: Diagnosis not present

## 2018-04-11 DIAGNOSIS — M25512 Pain in left shoulder: Secondary | ICD-10-CM | POA: Diagnosis not present

## 2018-04-11 DIAGNOSIS — G629 Polyneuropathy, unspecified: Secondary | ICD-10-CM | POA: Diagnosis not present

## 2018-04-11 DIAGNOSIS — M6281 Muscle weakness (generalized): Secondary | ICD-10-CM | POA: Diagnosis not present

## 2018-04-12 DIAGNOSIS — G629 Polyneuropathy, unspecified: Secondary | ICD-10-CM | POA: Diagnosis not present

## 2018-04-12 DIAGNOSIS — M6281 Muscle weakness (generalized): Secondary | ICD-10-CM | POA: Diagnosis not present

## 2018-04-12 DIAGNOSIS — M25512 Pain in left shoulder: Secondary | ICD-10-CM | POA: Diagnosis not present

## 2018-04-13 DIAGNOSIS — M6281 Muscle weakness (generalized): Secondary | ICD-10-CM | POA: Diagnosis not present

## 2018-04-13 DIAGNOSIS — M25512 Pain in left shoulder: Secondary | ICD-10-CM | POA: Diagnosis not present

## 2018-04-13 DIAGNOSIS — G629 Polyneuropathy, unspecified: Secondary | ICD-10-CM | POA: Diagnosis not present

## 2018-04-16 ENCOUNTER — Encounter: Payer: Self-pay | Admitting: Internal Medicine

## 2018-04-16 ENCOUNTER — Non-Acute Institutional Stay (SKILLED_NURSING_FACILITY): Payer: Medicare Other | Admitting: Internal Medicine

## 2018-04-16 DIAGNOSIS — M25572 Pain in left ankle and joints of left foot: Secondary | ICD-10-CM | POA: Diagnosis not present

## 2018-04-16 DIAGNOSIS — M6281 Muscle weakness (generalized): Secondary | ICD-10-CM | POA: Diagnosis not present

## 2018-04-16 DIAGNOSIS — G629 Polyneuropathy, unspecified: Secondary | ICD-10-CM | POA: Diagnosis not present

## 2018-04-16 DIAGNOSIS — M25512 Pain in left shoulder: Secondary | ICD-10-CM | POA: Diagnosis not present

## 2018-04-16 NOTE — Progress Notes (Signed)
Location:    [facility] Room Number: 202/A Place of Service:  SNF [phone]) Provider:  Loraine Maple, MD  Patient Care Team: [name], MD as PCP - General (Internal Medicine) Medina-Vargas, [name], NP as Nurse Practitioner (Internal Medicine)  Extended Emergency Contact Information Primary Emergency Contact: Hatfield,Lisa Address: [address]          [place], [place] [address] [place] Work Phone: [phone] Mobile Phone: [phone] Relation: Daughter  Code Status:  Full Code Goals of care: Advanced Directive information Advanced Directives 04/16/2018  Does Patient Have a Medical Advance Directive? Yes  Type of Advance Directive (No Data)  Does patient want to make changes to medical advance directive? No - Patient declined  Would patient like information on creating a medical advance directive? -     Chief Complaint  Patient presents with  . Acute Visit    Left ankle pain    HPI:  Pt is a 78 y.o. female seen today for an acute visit for complaints of left ankle pain.  Patient is a long-term resident of facility With a history of COPD on chronic oxygen as well as a history of chronic pain-atrial fibrillation allergic rhinitis hypothyroidism and depression.  She also has a history of right hip pain thought to be radiculopathy- she is currently at some point going back to Dr. Zannie Kehr for a possible facet joint block she is on pain medication including Norco 5 325 mg 12 hours as needed in addition to Neurontin 200 mg 3 times a day in addition to Tylenol as needed  Today she is complaining of some left medial ankle pain-she is not relating any history of trauma or falls.  She does at times use her walker.  Currently she is in her wheelchair.      Past Medical History:  Diagnosis Date  . COPD (chronic obstructive pulmonary disease) ([place])   . History of anxiety   . History of depression    . Spinal stenosis   . UTI (urinary tract infection)    Past Surgical History:  Procedure Laterality Date  . ANKLE FRACTURE SURGERY Left   . CATARACT EXTRACTION, BILATERAL    . FLEXIBLE SIGMOIDOSCOPY Left 11/18/2015   Procedure: FLEXIBLE SIGMOIDOSCOPY;  Surgeon: [name], MD;  Location: [facility];  Service: Endoscopy;  Laterality: Left;  . FLEXIBLE SIGMOIDOSCOPY N/A 11/20/2015   Procedure: FLEXIBLE SIGMOIDOSCOPY;  Surgeon: [name], MD;  [facility] ENDOSCOPY;  Service: Endoscopy;  Laterality: N/A;  . HIP ARTHROPLASTY Right 01/12/2016   Procedure: ARTHROPLASTY BIPOLAR HIP (HEMIARTHROPLASTY);  Surgeon: [name], MD;  Location: [facility];  Service: Orthopedics;  Laterality: Right;    Allergies  Allergen Reactions  . Biofreeze [Menthol (Topical Analgesic)] Rash    By history Aspercreme does not cause a rash    Outpatient Encounter Medications as of 04/16/2018  Medication Sig  . acetaminophen (TYLENOL) 325 MG tablet Take 2 tablets (650 mg total) by mouth every 8 (eight) hours as needed.  Marland Kitchen aspirin 81 MG chewable tablet Chew 81 mg by mouth 2 (two) times daily.  . busPIRone (BUSPAR) 10 MG tablet Take 10 mg by mouth daily. FOR ANXIETY  . busPIRone (BUSPAR) 5 MG tablet Take 5 mg by mouth 2 (two) times daily.   . cetirizine (ZYRTEC) 10 MG tablet Take 10 mg by mouth daily.  . digoxin (LANOXIN) 0.125 MG tablet Take 0.125 mg by mouth daily. Hold  if HR <60  . docusate sodium (COLACE) 100 MG capsule Take 1 capsule (100 mg total) by mouth 2 (two) times daily as needed for mild constipation.  Marland Kitchen escitalopram (LEXAPRO) 5 MG tablet Take 15 mg by mouth daily. Give 3 tablets to = 15 mg  . flecainide (TAMBOCOR) 50 MG tablet Take 50 mg by mouth every 12 (twelve) hours.  . fluticasone (FLONASE) 50 MCG/ACT nasal spray Place 2 sprays into both nostrils daily as needed.   . gabapentin (NEURONTIN) 100 MG capsule Take 200 mg by mouth 3 (three) times daily.  Marland Kitchen HYDROcodone-acetaminophen (NORCO/VICODIN)  5-325 MG tablet Take 1 tablet by mouth every 12 (twelve) hours as needed for moderate pain. T  . levalbuterol (XOPENEX) 1.25 MG/3ML nebulizer solution Take 1.25 mg by nebulization every 6 (six) hours as needed for wheezing.   Marland Kitchen levothyroxine (SYNTHROID, LEVOTHROID) 25 MCG tablet Take 25 mcg by mouth daily before breakfast.  . mirtazapine (REMERON) 15 MG tablet Take 15 mg by mouth at bedtime. FOR DEPRESSION AND APPETITE  . Multiple Vitamins-Minerals (MULTIVITAMIN WITH MINERALS) tablet Take 1 tablet by mouth daily.  . naphazoline-glycerin (CLEAR EYES REDNESS RELIEF) 0.012-0.2 % SOLN Place 1 drop into both eyes 2 (two) times daily.  Marland Kitchen NUTRITIONAL SUPPLEMENT LIQD Take 120 mLs by mouth 2 (two) times daily. MedPass  . ondansetron (ZOFRAN) 4 MG tablet Take 4 mg by mouth 2 (two) times daily.  . OXYGEN Inhale 2 L into the lungs at bedtime.  . pantoprazole (PROTONIX) 40 MG tablet Take 40 mg by mouth daily.  . polyethylene glycol (MIRALAX / GLYCOLAX) packet Take 17 g by mouth daily.  . primidone (MYSOLINE) 50 MG tablet TAKE 2 TABLETS (100MG ) BY MOUTH EVERY MORNING;TAKE 1 TABLET BY MOUTH AT BEDTIME  . sennosides-docusate sodium (SENOKOT-S) 8.6-50 MG tablet Take 2 tablets by mouth at bedtime.  Loura Pardon Salicylate (ASPERCREME EX) Apply 1 application topically 2 (two) times daily. Apply to posterior neck for muscle spasm  . umeclidinium bromide (INCRUSE ELLIPTA) 62.5 MCG/INH AEPB Inhale 1 puff into the lungs daily. (SHAKE WELL)   No facility-administered encounter medications on file as of 04/16/2018.     Review of Systems   In general she is not complaining of any fever chills.    Skin does not complain of rashes or itching.     Head ears eyes nose mouth and throat is not complaining of visual changes or sore throat-says at times she does not have a very good sense of taste but this is not new  Respiratory is not complain of shortness or increased cough beyond baseline  Cardiac does not complain  of chest pain or edema  GI does not complain of abdominal pain nausea vomiting diarrhea constipation-says her appetite is not that great   GU does not complain of dysuria  Musculoskeletal does have a significant history of chronic pain most recently in her right hip- she is not really complaining of hip pain today but says her left ankle is sore medial aspect  Neurologic does not complain of dizziness headache or numbness does have a history of tremors   Psych has a history of depression and anxiety at this point appears controlled  Immunization History  Administered Date(s) Administered  . Influenza, High Dose Seasonal PF 10/04/2016  . Influenza,inj,Quad PF,6+ Mos 01/13/2016  . Influenza-Unspecified 11/10/2017  . Pneumococcal-Unspecified 10/11/2011  . Tdap 01/23/2016   Pertinent  Health Maintenance Due  Topic Date Due  . PNA vac Low Risk Adult (2 of  2 - PCV13) 01/31/2019 (Originally 10/10/2012)  . INFLUENZA VACCINE  08/11/2018  . DEXA SCAN  Discontinued   Fall Risk  01/17/2018 11/09/2017 07/14/2017 04/10/2017 11/08/2016  Falls in the past year? 0 No No Yes Yes  Number falls in past yr: 0 - - 2 or more 2 or more  Injury with Fall? 0 - - Yes Yes  Comment - - - - R hip, L ankle  Risk Factor Category  - - - High Fall Risk -  Follow up Falls evaluation completed - - Falls evaluation completed -   Functional Status Survey:     She is afebrile--pulse is 72-respirations of 18 Physical Exam   In general this is a pleasant elderly female in no distress sitting comfortably in her wheelchair   skin is warm and dry.  Eyes visual acuity appears to be intact  Oropharynx is clear mucous membranes moist  Chest is clear to auscultation with shallow air entry-minimal bronchial sounds.  There is no labored breathing   heart is largely regular rate and rhythm with an occasional irregular beat She does not have significant lower extremity edema  Abdomen is soft nontender with active  bowel sounds  Musculoskeletal is able to move all extremities x4 she actually ambulates well in her wheelchair.  Medial left ankle there is a small well-defined area of mild erythema medial ankle-at the prominence- this is slightly tender to palpation not warm.  General pressure is mildly tender to palpation in the area and inferior to it.  Flexion and extension rotation of the ankle does not really elicit significant pain  Neurologic is grossly intact touch sensation appears to be intact-her speech is clear.  Psych she is alert and oriented pleasant and appropriate  Labs reviewed: Recent Labs    12/05/17 03/02/18  NA 141 141  K 5.1 4.5  CL  --  102  CO2  --  29  BUN 16 11  CREATININE 1.1 1.0  CALCIUM  --  8.2   Recent Labs    12/05/17  AST 12*  ALT 8  ALKPHOS 97   Recent Labs    12/05/17 01/24/18 01/29/18  WBC 6.2 8.2 9.0  NEUTROABS 4 7 7   HGB 10.5* 9.7* 10.4*  HCT 31* 29* 30*  PLT 213 294 358   Lab Results  Component Value Date   TSH 4.17 03/02/2018   Lab Results  Component Value Date   HGBA1C 5.2 12/01/2015   Lab Results  Component Value Date   CHOL 107 12/01/2015   HDL 27 (L) 12/01/2015   LDLCALC 59 12/01/2015   TRIG 107 12/01/2015   CHOLHDL 4.0 12/01/2015    Significant Diagnostic Results in last 30 days:  No results found.  Assessment/Plan  #1 left ankle discomfort-this is more the medial aspect she states she has had some increasing pain with this recently- I do not really see evidence of gout- but will order an x-ray to rule out any acute bony pathology the area of erythema is somewhat chronic in that area of the foot but she says the tenderness has increased   [id number]

## 2018-04-17 DIAGNOSIS — M25512 Pain in left shoulder: Secondary | ICD-10-CM | POA: Diagnosis not present

## 2018-04-17 DIAGNOSIS — M6281 Muscle weakness (generalized): Secondary | ICD-10-CM | POA: Diagnosis not present

## 2018-04-17 DIAGNOSIS — G629 Polyneuropathy, unspecified: Secondary | ICD-10-CM | POA: Diagnosis not present

## 2018-04-18 DIAGNOSIS — G629 Polyneuropathy, unspecified: Secondary | ICD-10-CM | POA: Diagnosis not present

## 2018-04-18 DIAGNOSIS — M25512 Pain in left shoulder: Secondary | ICD-10-CM | POA: Diagnosis not present

## 2018-04-18 DIAGNOSIS — M6281 Muscle weakness (generalized): Secondary | ICD-10-CM | POA: Diagnosis not present

## 2018-04-19 DIAGNOSIS — G629 Polyneuropathy, unspecified: Secondary | ICD-10-CM | POA: Diagnosis not present

## 2018-04-19 DIAGNOSIS — M25512 Pain in left shoulder: Secondary | ICD-10-CM | POA: Diagnosis not present

## 2018-04-19 DIAGNOSIS — M6281 Muscle weakness (generalized): Secondary | ICD-10-CM | POA: Diagnosis not present

## 2018-04-20 DIAGNOSIS — M25512 Pain in left shoulder: Secondary | ICD-10-CM | POA: Diagnosis not present

## 2018-04-20 DIAGNOSIS — R630 Anorexia: Secondary | ICD-10-CM | POA: Diagnosis not present

## 2018-04-20 DIAGNOSIS — G629 Polyneuropathy, unspecified: Secondary | ICD-10-CM | POA: Diagnosis not present

## 2018-04-20 DIAGNOSIS — M6281 Muscle weakness (generalized): Secondary | ICD-10-CM | POA: Diagnosis not present

## 2018-04-20 DIAGNOSIS — G4700 Insomnia, unspecified: Secondary | ICD-10-CM | POA: Diagnosis not present

## 2018-04-20 DIAGNOSIS — F419 Anxiety disorder, unspecified: Secondary | ICD-10-CM | POA: Diagnosis not present

## 2018-04-20 DIAGNOSIS — F331 Major depressive disorder, recurrent, moderate: Secondary | ICD-10-CM | POA: Diagnosis not present

## 2018-04-21 ENCOUNTER — Other Ambulatory Visit: Payer: Self-pay | Admitting: Adult Health

## 2018-04-21 MED ORDER — HYDROCODONE-ACETAMINOPHEN 5-325 MG PO TABS: 1.0000 | ORAL_TABLET | Freq: Two times a day (BID) | ORAL | 0 refills | Status: DC | PRN

## 2018-04-23 DIAGNOSIS — M25512 Pain in left shoulder: Secondary | ICD-10-CM | POA: Diagnosis not present

## 2018-04-23 DIAGNOSIS — G629 Polyneuropathy, unspecified: Secondary | ICD-10-CM | POA: Diagnosis not present

## 2018-04-23 DIAGNOSIS — M6281 Muscle weakness (generalized): Secondary | ICD-10-CM | POA: Diagnosis not present

## 2018-04-24 ENCOUNTER — Encounter (INDEPENDENT_AMBULATORY_CARE_PROVIDER_SITE_OTHER): Payer: Medicare Other | Admitting: Physical Medicine and Rehabilitation

## 2018-04-24 DIAGNOSIS — G629 Polyneuropathy, unspecified: Secondary | ICD-10-CM | POA: Diagnosis not present

## 2018-04-24 DIAGNOSIS — M25512 Pain in left shoulder: Secondary | ICD-10-CM | POA: Diagnosis not present

## 2018-04-24 DIAGNOSIS — M6281 Muscle weakness (generalized): Secondary | ICD-10-CM | POA: Diagnosis not present

## 2018-04-25 ENCOUNTER — Ambulatory Visit (INDEPENDENT_AMBULATORY_CARE_PROVIDER_SITE_OTHER): Payer: Medicare Other | Admitting: Orthopaedic Surgery

## 2018-04-25 DIAGNOSIS — G629 Polyneuropathy, unspecified: Secondary | ICD-10-CM | POA: Diagnosis not present

## 2018-04-25 DIAGNOSIS — M6281 Muscle weakness (generalized): Secondary | ICD-10-CM | POA: Diagnosis not present

## 2018-04-25 DIAGNOSIS — M25512 Pain in left shoulder: Secondary | ICD-10-CM | POA: Diagnosis not present

## 2018-04-26 ENCOUNTER — Encounter: Payer: Self-pay | Admitting: Internal Medicine

## 2018-04-26 ENCOUNTER — Non-Acute Institutional Stay (SKILLED_NURSING_FACILITY): Payer: Medicare Other | Admitting: Internal Medicine

## 2018-04-26 DIAGNOSIS — M5441 Lumbago with sciatica, right side: Secondary | ICD-10-CM

## 2018-04-26 DIAGNOSIS — G629 Polyneuropathy, unspecified: Secondary | ICD-10-CM | POA: Diagnosis not present

## 2018-04-26 DIAGNOSIS — K130 Diseases of lips: Secondary | ICD-10-CM | POA: Diagnosis not present

## 2018-04-26 DIAGNOSIS — M6281 Muscle weakness (generalized): Secondary | ICD-10-CM | POA: Diagnosis not present

## 2018-04-26 DIAGNOSIS — G8929 Other chronic pain: Secondary | ICD-10-CM | POA: Insufficient documentation

## 2018-04-26 DIAGNOSIS — M5412 Radiculopathy, cervical region: Secondary | ICD-10-CM | POA: Diagnosis not present

## 2018-04-26 DIAGNOSIS — R251 Tremor, unspecified: Secondary | ICD-10-CM | POA: Diagnosis not present

## 2018-04-26 DIAGNOSIS — M25512 Pain in left shoulder: Secondary | ICD-10-CM | POA: Diagnosis not present

## 2018-04-26 DIAGNOSIS — F419 Anxiety disorder, unspecified: Secondary | ICD-10-CM

## 2018-04-26 NOTE — Assessment & Plan Note (Signed)
Neurologic follow-up appointment has been scheduled.

## 2018-04-26 NOTE — Progress Notes (Signed)
NURSING HOME LOCATION:  [facility] ROOM NUMBER:  [id number]  CODE STATUS:  Full Code  PCP:  Pascal Lux MD  This is a nursing facility follow up of chronic medical diagnoses; she does have multiple issues of concern.  Interim medical record and care since last [place] visit was updated with review of diagnostic studies and change in clinical status since last visit were documented.  HPI: She continues to have multiple systems complaints including chronic pain.  At this time her major complaint is right-sided headache.  This starts in the right neck and radiates up the right side of the head to the crown.  Ultrasound is being employed with benefit. She is concerned about a rash at the angle of the mouth on the left.  Topical antibiotic has been initiated.  There are no associated constitutional or infectious symptoms.  She denies any drooling, but staff has noted such. Orthopedics has seen her for her chronic back and hip  pain; she has had steroid injections. 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 Carles Collet who is a movement disorder specialist. She does describe loss of taste but no other sensory loss. She is on Flonase and Zyrtec but has no active extrinsic symptoms.  Review of systems:  Constitutional: No fever, significant weight change, fatigue  Eyes: No redness, discharge, pain, vision change ENT/mouth: No nasal congestion,  purulent discharge, earache, change in hearing, sore throat  Cardiovascular: No chest pain, palpitations, paroxysmal nocturnal dyspnea, claudication, edema  Respiratory: No cough, sputum production, hemoptysis, DOE, significant snoring, apnea   Gastrointestinal: No heartburn, dysphagia, abdominal pain, nausea /vomiting, rectal bleeding, melena, change in bowels Genitourinary: No dysuria, hematuria, pyuria, incontinence, nocturia Dermatologic: No  Pruritus Neurologic: No dizziness,syncope, seizures, numbness, tingling  Psychiatric: No significant anxiety, depression, insomnia, anorexia Endocrine: No change in hair/nails, excessive thirst, excessive hunger, excessive urination  Hematologic/lymphatic: No significant bruising, lymphadenopathy, abnormal bleeding Allergy/immunology: No itchy/watery eyes, significant sneezing, urticaria, angioedema  Physical exam:  Pertinent or positive findings: She appears younger than her stated age.  She has slight intermittent exotropia on the left.  She has an upper partial.  She is not wearing the lower plate.  There is minimal erythema at the angle of the mouth on the left w/o clinical cellulitis.  This appears to have a rectangular pattern suggesting contact dermatitis.  Heart rhythm is slightly irregular.  Breath sounds are decreased.  Posterior tibial pulses are decreased.  She has intermittent tremor of both hands as well as the jaw.  The right lower extremity is weaker to opposition compared to the left.  Strength in the upper extremities is fair.  General appearance: Adequately nourished; no acute distress, increased work of breathing is present.   Lymphatic: No lymphadenopathy about the head, neck, axilla. Eyes: No conjunctival inflammation or lid edema is present. There is no scleral icterus. Ears:  External ear exam shows no significant lesions or deformities.   Nose:  External nasal examination shows no deformity or inflammation. Nasal mucosa are pink and moist without lesions, exudates Oral exam:  Lips and gums are healthy appearing. There is no oropharyngeal erythema or exudate. Neck:  No thyromegaly, masses, tenderness noted.    Heart:  No gallop, murmur, click, rub .  Lungs:  without wheezes, rhonchi, rales, rubs. Abdomen: Bowel sounds are normal. Abdomen is soft and nontender with no organomegaly, hernias, masses. GU: Deferred  Extremities:  No cyanosis, clubbing, edema  Neurologic exam : Balance, Rhomberg,  finger to nose testing could not be completed due to  clinical state Skin: Warm & dry w/o tenting. No significant lesions.  See summary under each active problem in the Problem List with associated updated therapeutic plan

## 2018-04-26 NOTE — Assessment & Plan Note (Signed)
Orthopedic follow-up will be rescheduled following resolution of the corona epidemic.

## 2018-04-26 NOTE — Patient Instructions (Signed)
See assessment and plan under each diagnosis in the problem list and acutely for this visit 

## 2018-04-26 NOTE — Assessment & Plan Note (Signed)
No associated worrisome neurologic signs or symptoms.  Subjective response to ultrasound through PT/OT.

## 2018-04-27 DIAGNOSIS — M25512 Pain in left shoulder: Secondary | ICD-10-CM | POA: Diagnosis not present

## 2018-04-27 DIAGNOSIS — G629 Polyneuropathy, unspecified: Secondary | ICD-10-CM | POA: Diagnosis not present

## 2018-04-27 DIAGNOSIS — M6281 Muscle weakness (generalized): Secondary | ICD-10-CM | POA: Diagnosis not present

## 2018-04-27 NOTE — Assessment & Plan Note (Signed)
Psych NP follow up

## 2018-04-30 ENCOUNTER — Other Ambulatory Visit: Payer: Self-pay

## 2018-04-30 DIAGNOSIS — G629 Polyneuropathy, unspecified: Secondary | ICD-10-CM | POA: Diagnosis not present

## 2018-04-30 DIAGNOSIS — M25512 Pain in left shoulder: Secondary | ICD-10-CM | POA: Diagnosis not present

## 2018-04-30 DIAGNOSIS — M6281 Muscle weakness (generalized): Secondary | ICD-10-CM | POA: Diagnosis not present

## 2018-04-30 MED ORDER — HYDROCODONE-ACETAMINOPHEN 5-325 MG PO TABS: 1.0000 | ORAL_TABLET | Freq: Two times a day (BID) | ORAL | 0 refills | Status: DC | PRN

## 2018-05-01 DIAGNOSIS — M6281 Muscle weakness (generalized): Secondary | ICD-10-CM | POA: Diagnosis not present

## 2018-05-01 DIAGNOSIS — M25512 Pain in left shoulder: Secondary | ICD-10-CM | POA: Diagnosis not present

## 2018-05-01 DIAGNOSIS — G629 Polyneuropathy, unspecified: Secondary | ICD-10-CM | POA: Diagnosis not present

## 2018-05-03 DIAGNOSIS — M25512 Pain in left shoulder: Secondary | ICD-10-CM | POA: Diagnosis not present

## 2018-05-03 DIAGNOSIS — M6281 Muscle weakness (generalized): Secondary | ICD-10-CM | POA: Diagnosis not present

## 2018-05-03 DIAGNOSIS — G629 Polyneuropathy, unspecified: Secondary | ICD-10-CM | POA: Diagnosis not present

## 2018-05-04 DIAGNOSIS — M6281 Muscle weakness (generalized): Secondary | ICD-10-CM | POA: Diagnosis not present

## 2018-05-04 DIAGNOSIS — M25512 Pain in left shoulder: Secondary | ICD-10-CM | POA: Diagnosis not present

## 2018-05-04 DIAGNOSIS — G629 Polyneuropathy, unspecified: Secondary | ICD-10-CM | POA: Diagnosis not present

## 2018-05-07 DIAGNOSIS — M25512 Pain in left shoulder: Secondary | ICD-10-CM | POA: Diagnosis not present

## 2018-05-07 DIAGNOSIS — G629 Polyneuropathy, unspecified: Secondary | ICD-10-CM | POA: Diagnosis not present

## 2018-05-07 DIAGNOSIS — M6281 Muscle weakness (generalized): Secondary | ICD-10-CM | POA: Diagnosis not present

## 2018-05-08 DIAGNOSIS — G629 Polyneuropathy, unspecified: Secondary | ICD-10-CM | POA: Diagnosis not present

## 2018-05-08 DIAGNOSIS — M6281 Muscle weakness (generalized): Secondary | ICD-10-CM | POA: Diagnosis not present

## 2018-05-08 DIAGNOSIS — M25512 Pain in left shoulder: Secondary | ICD-10-CM | POA: Diagnosis not present

## 2018-05-09 ENCOUNTER — Other Ambulatory Visit: Payer: Self-pay | Admitting: Internal Medicine

## 2018-05-09 DIAGNOSIS — M6281 Muscle weakness (generalized): Secondary | ICD-10-CM | POA: Diagnosis not present

## 2018-05-09 DIAGNOSIS — G629 Polyneuropathy, unspecified: Secondary | ICD-10-CM | POA: Diagnosis not present

## 2018-05-09 DIAGNOSIS — M25512 Pain in left shoulder: Secondary | ICD-10-CM | POA: Diagnosis not present

## 2018-05-09 MED ORDER — HYDROCODONE-ACETAMINOPHEN 5-325 MG PO TABS: 1.0000 | ORAL_TABLET | Freq: Two times a day (BID) | ORAL | 0 refills | Status: DC | PRN

## 2018-05-10 DIAGNOSIS — M6281 Muscle weakness (generalized): Secondary | ICD-10-CM | POA: Diagnosis not present

## 2018-05-10 DIAGNOSIS — G629 Polyneuropathy, unspecified: Secondary | ICD-10-CM | POA: Diagnosis not present

## 2018-05-10 DIAGNOSIS — M25512 Pain in left shoulder: Secondary | ICD-10-CM | POA: Diagnosis not present

## 2018-05-11 ENCOUNTER — Non-Acute Institutional Stay (SKILLED_NURSING_FACILITY): Payer: Medicare Other | Admitting: Internal Medicine

## 2018-05-11 ENCOUNTER — Encounter: Payer: Self-pay | Admitting: Internal Medicine

## 2018-05-11 DIAGNOSIS — K130 Diseases of lips: Secondary | ICD-10-CM

## 2018-05-11 DIAGNOSIS — F339 Major depressive disorder, recurrent, unspecified: Secondary | ICD-10-CM | POA: Diagnosis not present

## 2018-05-11 DIAGNOSIS — M5441 Lumbago with sciatica, right side: Secondary | ICD-10-CM

## 2018-05-11 DIAGNOSIS — M25512 Pain in left shoulder: Secondary | ICD-10-CM | POA: Diagnosis not present

## 2018-05-11 DIAGNOSIS — G8929 Other chronic pain: Secondary | ICD-10-CM

## 2018-05-11 DIAGNOSIS — G894 Chronic pain syndrome: Secondary | ICD-10-CM

## 2018-05-11 DIAGNOSIS — M6281 Muscle weakness (generalized): Secondary | ICD-10-CM | POA: Diagnosis not present

## 2018-05-11 DIAGNOSIS — G629 Polyneuropathy, unspecified: Secondary | ICD-10-CM | POA: Diagnosis not present

## 2018-05-11 NOTE — Progress Notes (Signed)
This is an acute visit.  Level care skilled.  Facility is [employer], political party complaint-acute visit in secondary to chronic pain complaints-also follow-up angular cheilitis  History of present illness.  Patient is a 78 year old female with a history of chronic pain more so of her back and right hip and leg area- as well as a history of COPD- atrial fibrillation-tremors-hypothyroidism depression and allergic rhinitis  Patient's main complaint is to be some pain more so of her right hip and leg.  She has been seen by orthopedics with recommendation to see Dr. Ernestina Patches for possible facet joint block apparently she is received in the past.    This is been complicated by coronavirus quarantine so she has not been able to see him he.  There was thought was more neuropathy issue versus hip pain.  She does have a history of chronic pain continues on Norco 5/325 mg 1 tab every 12 hours as needed as well as Neurontin 200 mg 3 times daily.  She also has a PRN Tylenol order  She also was seen recently for a rash on left side of her mouth this was thought to be angular cheilitis appears to be responding to topical zinc oxide.  She also does have a history of depression she was recently seen I believe by tele-visit by nurse practitioner and her Lexapro was increased to 20 mg a day and Buspar 3 times a day   Past Medical History:  Diagnosis Date  . COPD (chronic obstructive pulmonary disease) ([place])   . History of anxiety   . History of depression   . Spinal stenosis   . UTI (urinary tract infection)         Past Surgical History:  Procedure Laterality Date  . ANKLE FRACTURE SURGERY Left   . CATARACT EXTRACTION, BILATERAL    . FLEXIBLE SIGMOIDOSCOPY Left 11/18/2015   Procedure: FLEXIBLE SIGMOIDOSCOPY;  Surgeon: [name], MD;  Location: [facility];  Service: Endoscopy;  Laterality: Left;  . FLEXIBLE SIGMOIDOSCOPY N/A 11/20/2015   Procedure: FLEXIBLE SIGMOIDOSCOPY;  Surgeon:  [name], MD;  Location: [facility] ENDOSCOPY;  Service: Endoscopy;  Laterality: N/A;  . HIP ARTHROPLASTY Right 01/12/2016   Procedure: ARTHROPLASTY BIPOLAR HIP (HEMIARTHROPLASTY);  Surgeon: [name], MD;  Location: [facility];  Service: Orthopedics;  Laterality: Right;         Allergies  Allergen Reactions  . Biofreeze [Menthol (Topical Analgesic)] Rash    By history Aspercreme does not cause a rash        Outpatient Encounter Medications as of 04/16/2018  Medication Sig  . acetaminophen (TYLENOL) 325 MG tablet Take 2 tablets (650 mg total) by mouth every 8 (eight) hours as needed.  Marland Kitchen aspirin 81 MG chewable tablet Chew 81 mg by mouth 2 (two) times daily.  . busPIRone (BUSPAR) 10 MG tablet Take 10 mg by mouth daily. FOR ANXIETY  . busPIRone (BUSPAR) 5 MG tablet Take 5 mg by mouth 2 (two) times daily.   . cetirizine (ZYRTEC) 10 MG tablet Take 10 mg by mouth daily.  . digoxin (LANOXIN) 0.125 MG tablet Take 0.125 mg by mouth daily. Hold if HR <60  . docusate sodium (COLACE) 100 MG capsule Take 1 capsule (100 mg total) by mouth 2 (two) times daily as needed for mild constipation.  Marland Kitchen escitalopram (LEXAPRO) 20 MG tablet  Take 20 mg po QD  . flecainide (TAMBOCOR) 50 MG tablet Take 50 mg by mouth every 12 (twelve) hours.  . fluticasone (FLONASE) 50 MCG/ACT  nasal spray Place 2 sprays into both nostrils daily as needed.   . gabapentin (NEURONTIN) 100 MG capsule Take 200 mg by mouth 3 (three) times daily.  Marland Kitchen HYDROcodone-acetaminophen (NORCO/VICODIN) 5-325 MG tablet Take 1 tablet by mouth every 12 (twelve) hours as needed for moderate pain. T  . levalbuterol (XOPENEX) 1.25 MG/3ML nebulizer solution Take 1.25 mg by nebulization every 6 (six) hours as needed for wheezing.   Marland Kitchen levothyroxine (SYNTHROID, LEVOTHROID) 25 MCG tablet Take 25 mcg by mouth daily before breakfast.  . mirtazapine (REMERON) 15 MG tablet Take 15 mg by mouth at bedtime. FOR DEPRESSION AND APPETITE  . Multiple Vitamins-Minerals  (MULTIVITAMIN WITH MINERALS) tablet Take 1 tablet by mouth daily.  . naphazoline-glycerin (CLEAR EYES REDNESS RELIEF) 0.012-0.2 % SOLN Place 1 drop into both eyes 2 (two) times daily.  Marland Kitchen NUTRITIONAL SUPPLEMENT LIQD Take 120 mLs by mouth 2 (two) times daily. Med[employer]  . ondansetron (ZOFRAN) 4 MG tablet Take 4 mg by mouth 2 (two) times daily.  . OXYGEN Inhale 2 L into the lungs at bedtime.  . pantoprazole (PROTONIX) 40 MG tablet Take 40 mg by mouth daily.  . polyethylene glycol (MIRALAX / GLYCOLAX) packet Take 17 g by mouth daily.  . primidone (MYSOLINE) 50 MG tablet TAKE 2 TABLETS (100MG ) BY MOUTH EVERY MORNING;TAKE 1 TABLET BY MOUTH AT BEDTIME  . sennosides-docusate sodium (SENOKOT-S) 8.6-50 MG tablet Take 2 tablets by mouth at bedtime.  Loura Pardon Salicylate (ASPERCREME EX) Apply 1 application topically 2 (two) times daily. Apply to posterior neck for muscle spasm  . umeclidinium bromide (INCRUSE ELLIPTA) 62.5 MCG/INH AEPB Inhale 1 puff into the lungs daily. (SHAKE WELL)   No facility-administered encounter medications on file as of 04/16/2018.      .  In general she is not complaining of any fever or chills.  Skin does not complain of itching she has a small rash left side of mouth that appears to be resolving.  Head ears eyes nose mouth and throat at times will complain of a headache not complaining of that today does not complain of visual changes or sore throat  Respiratory does have a history of COPD on oxygen but does not complain of shortness of breath or cough today.  Cardiac does not complain of chest pain or edema.  GI is not complaining of abdominal pain nausea vomiting diarrhea constipation.  GU does not complain of dysuria.  Musculoskeletal again does have chronic pain somewhat diffuse more so over back and right hip and leg.  Neurologic does not complain of dizziness is thought there is some neuropathy radiculopathies etiology to her right leg pain.  Psych does have  a history of depression and anxiety she does not complain of any overt anxiety or depression today   Physical exam.   Temperature is 98.0 pulse 80 respirations 20 blood pressure 130/76.  In general this is a pleasant elderly female in no distress lying comfortably in bed.  Her skin is warm and dry.  Rash on the left side of her mouth appears to be resolving still a small remnant     Oropharynx is clear mucous membranes moist.  Chest is clear to auscultation with shallow air entry there is no labored breathing.  Heart is regular rate and rhythm with some irregular beats at times--she does not really have significant lower extremity edema  Abdomen is soft nontender with positive bowel sounds.  Musculoskeletal Limited exam since she is in bed but is able to move all  extremities for-I did not note any deformity of her right leg hip or back.  Appears to move her extremities at baseline but says she has continued pain of her right leg especially with therapy Possibly some mild right lower extremity weakness compared to left but difficult to fully tell since she is lying in bed   neurologic she is bright alert could not really appreciate true lateralizing findings  Psych she is alert and oriented pleasant and appropriate  Labs.  March 02, 2018.  Sodium 141 potassium 4.5 BUN 11.2 creatinine 0.95  TSH 4.17.  Digoxin 0.9.  BNP-417.8   January 20th 2020.  WBC 9.0 hemoglobin 10.4 platelets 358   Assessment and plan.  1.  History of chronic right hip and leg pain-as noted above she has seen orthopedics with recommendation to see Dr. Ernestina Patches for fascicular nerve block-again this is been complicated by coronavirus quarantine outside visits.  She does have Norco twice a day as needed Tylenol as needed and Neurontin 3 times a day.  I did discuss this with Dr. Linna Darner via phone consider adding Cymbalta which may help with depression as well as with her pain management.   However she is followed by psychiatric nurse practitioner who recently increased her Lexapro- I have written order to consult with her about possibly switching from Lexapro to Cymbalta but would like to get her input on this as well.  2 depression again continues on Lexapro recently increased to 20 mg a day she is also on BuSpar 3 times a day for anxiety and also on Remeron which I believe also is to help with appetite stimulation.  This appears relatively stable again will await psychiatric input on switching to Cymbalta from Lexapro.  3.  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

## 2018-05-14 DIAGNOSIS — M6281 Muscle weakness (generalized): Secondary | ICD-10-CM | POA: Diagnosis not present

## 2018-05-14 DIAGNOSIS — G629 Polyneuropathy, unspecified: Secondary | ICD-10-CM | POA: Diagnosis not present

## 2018-05-14 DIAGNOSIS — M25512 Pain in left shoulder: Secondary | ICD-10-CM | POA: Diagnosis not present

## 2018-05-15 DIAGNOSIS — M25512 Pain in left shoulder: Secondary | ICD-10-CM | POA: Diagnosis not present

## 2018-05-15 DIAGNOSIS — G629 Polyneuropathy, unspecified: Secondary | ICD-10-CM | POA: Diagnosis not present

## 2018-05-15 DIAGNOSIS — M6281 Muscle weakness (generalized): Secondary | ICD-10-CM | POA: Diagnosis not present

## 2018-05-16 ENCOUNTER — Non-Acute Institutional Stay (SKILLED_NURSING_FACILITY): Payer: Medicare Other | Admitting: Internal Medicine

## 2018-05-16 ENCOUNTER — Encounter: Payer: Self-pay | Admitting: Internal Medicine

## 2018-05-16 ENCOUNTER — Other Ambulatory Visit: Payer: Self-pay | Admitting: Internal Medicine

## 2018-05-16 DIAGNOSIS — G8929 Other chronic pain: Secondary | ICD-10-CM

## 2018-05-16 DIAGNOSIS — J410 Simple chronic bronchitis: Secondary | ICD-10-CM | POA: Diagnosis not present

## 2018-05-16 DIAGNOSIS — G629 Polyneuropathy, unspecified: Secondary | ICD-10-CM | POA: Diagnosis not present

## 2018-05-16 DIAGNOSIS — M25512 Pain in left shoulder: Secondary | ICD-10-CM | POA: Diagnosis not present

## 2018-05-16 DIAGNOSIS — G894 Chronic pain syndrome: Secondary | ICD-10-CM

## 2018-05-16 DIAGNOSIS — I4811 Longstanding persistent atrial fibrillation: Secondary | ICD-10-CM | POA: Diagnosis not present

## 2018-05-16 DIAGNOSIS — F339 Major depressive disorder, recurrent, unspecified: Secondary | ICD-10-CM

## 2018-05-16 DIAGNOSIS — M5441 Lumbago with sciatica, right side: Secondary | ICD-10-CM

## 2018-05-16 DIAGNOSIS — M6281 Muscle weakness (generalized): Secondary | ICD-10-CM | POA: Diagnosis not present

## 2018-05-16 NOTE — Progress Notes (Signed)
Location:  [facility] Room Number: 220-U Place of Service:  SNF (31) Provider:  Granville Lewis, PA-C  [name], MD  Patient Care Team: [name], MD as PCP - General (Internal Medicine) Medina-Vargas, [name], NP as Nurse Practitioner (Internal Medicine)  Extended Emergency Contact Information Primary Emergency Contact: Hatfield,Dawn Address: [address]          [place], [place] [address] [name] Work Phone: [phone] Mobile Phone: [phone] Relation: Daughter  Code Status:  Full Code Goals of care: Advanced Directive information Advanced Directives 04/26/2018  Does Patient Have a Medical Advance Directive? Yes  Type of Advance Directive Out of facility DNR (pink MOST or yellow form)  Does patient want to make changes to medical advance directive? No - Patient declined  Would patient like information on creating a medical advance directive? -     Chief Complaint  Patient presents with  . Medical Management of Chronic Issues    Routine [facility] visit  Medical management of chronic medical conditions including atrial fibrillation-COPD oxygen dependent-chronic pain-history of tremors as well as allergic rhinitis depression and hypothyroidism  HPI:  Pt is a 78 y.o. female seen today for medical management of chronic diseases.  As noted above  She continues to complain at times of right hip pain- she did have an orthopedic consult and impression was this was multilevel spondylosis-with facet arthropathy-and she was referred back to Dr. Ernestina Patches for a possible facet joint block which she has received in the past.  It was thought the pain was not really coming from her hip.  This re-consult is pending this is been complicated by coronavirus quarantine.  She continues to complain of some pain at times she is on Norco 3 times a day as needed in addition to Neurontin 3 times a day and PRN Tylenol.  I did see her recently  and there was consideration to add Cymbalta- however she is already on Lexapro which was just increased by the psychiatric nurse practitioner.  And we are awaiting word from the nurse practitioner about possibly switching her to Cymbalta which I suspect will have some better pain control but would like psychiatric nurse practitioner's input on this.  Regards to COPD this appears to be stable she is on chronic oxygen as well as orders for Leva albuterol as needed as well as Incruse Ellipta.  As noted above she is on Lexapro for depression she is also on Remeron 50 mg a day I suspect to help with her appetite she is also on BuSpar 3 times daily.  Regards atrial fibrillation this appears rate controlled on flecainide as well as digoxin.  She also has a history of tremors she is on primidone 50 mg she gets 2 tabs in the morning 1 tab in the p.m. this appears to be pretty effective.  Currently she is sitting in her wheelchair she appears to be comfortable but does continue to complain of some right hip and leg pain at times.        Past Medical History:  Diagnosis Date  . COPD (chronic obstructive pulmonary disease) ([place])   . History of anxiety   . History of depression   . Spinal stenosis   . UTI (urinary tract infection)    Past Surgical History:  Procedure Laterality Date  . ANKLE FRACTURE SURGERY Left   . CATARACT EXTRACTION, BILATERAL    . FLEXIBLE SIGMOIDOSCOPY Left 11/18/2015   Procedure: FLEXIBLE SIGMOIDOSCOPY;  Surgeon:  [name], MD;  Location: [facility] ENDOSCOPY;  Service: Endoscopy;  Laterality: Left;  . FLEXIBLE SIGMOIDOSCOPY N/A 11/20/2015   Procedure: FLEXIBLE SIGMOIDOSCOPY;  Surgeon: [name], MD;  Location: [facility] ENDOSCOPY;  Service: Endoscopy;  Laterality: N/A;  . HIP ARTHROPLASTY Right 01/12/2016   Procedure: ARTHROPLASTY BIPOLAR HIP (HEMIARTHROPLASTY);  Surgeon: [name], MD;  Location: [facility] ORS;  Service: Orthopedics;  Laterality: Right;    Allergies  Allergen  Reactions  . Biofreeze [Menthol (Topical Analgesic)] Rash    By history Aspercreme does not cause a rash    Outpatient Encounter Medications as of 05/16/2018  Medication Sig  . acetaminophen (TYLENOL) 325 MG tablet Take 2 tablets (650 mg total) by mouth every 8 (eight) hours as needed.  Marland Kitchen aspirin 81 MG chewable tablet Chew 81 mg by mouth 2 (two) times daily.  . busPIRone (BUSPAR) 10 MG tablet Take 10 mg by mouth daily. FOR ANXIETY  . busPIRone (BUSPAR) 5 MG tablet Take 5 mg by mouth 2 (two) times daily.   . digoxin (LANOXIN) 0.125 MG tablet Take 0.125 mg by mouth daily. Hold if HR <60  . docusate sodium (COLACE) 100 MG capsule Take 1 capsule (100 mg total) by mouth 2 (two) times daily as needed for mild constipation.  Marland Kitchen escitalopram (LEXAPRO) 20 MG tablet 20 mg. TAKE 1 TABLET BY MOUTH ONCE DAILY FOR DEPRESSION  . flecainide (TAMBOCOR) 50 MG tablet Take 50 mg by mouth every 12 (twelve) hours.  . gabapentin (NEURONTIN) 100 MG capsule Take 200 mg by mouth 3 (three) times daily.  Marland Kitchen HYDROcodone-acetaminophen (NORCO/VICODIN) 5-325 MG tablet Take 1 tablet by mouth every 12 (twelve) hours as needed for up to 14 doses for moderate pain. T  . levalbuterol (XOPENEX) 1.25 MG/3ML nebulizer solution Take 1.25 mg by nebulization every 6 (six) hours as needed for wheezing.   Marland Kitchen levothyroxine (SYNTHROID, LEVOTHROID) 25 MCG tablet Take 25 mcg by mouth daily before breakfast.  . mirtazapine (REMERON) 15 MG tablet Take 15 mg by mouth at bedtime. FOR DEPRESSION AND APPETITE  . Multiple Vitamins-Minerals (MULTIVITAMIN WITH MINERALS) tablet Take 1 tablet by mouth daily.  . naphazoline-glycerin (CLEAR EYES REDNESS RELIEF) 0.012-0.2 % SOLN Place 1 drop into both eyes 2 (two) times daily.  Marland Kitchen NUTRITIONAL SUPPLEMENT LIQD Take 120 mLs by mouth 2 (two) times daily. MedPass  . ondansetron (ZOFRAN) 4 MG tablet Take 4 mg by mouth 2 (two) times daily.  . OXYGEN Inhale 2 L into the lungs at bedtime.  . pantoprazole (PROTONIX) 40  MG tablet Take 40 mg by mouth daily.  . polyethylene glycol (MIRALAX / GLYCOLAX) packet Take 17 g by mouth daily.  . primidone (MYSOLINE) 50 MG tablet Take 50-100 mg by mouth See admin instructions. TAKE 2 TABLETS (100MG ) BY MOUTH EVERY MORNING;TAKE 1 TABLET BY MOUTH AT BEDTIME  . sennosides-docusate sodium (SENOKOT-S) 8.6-50 MG tablet Take 2 tablets by mouth at bedtime.   Loura Pardon Salicylate (ASPERCREME EX) Apply 1 application topically 2 (two) times daily as needed (Posterior neck muscle spasm).   Marland Kitchen umeclidinium bromide (INCRUSE ELLIPTA) 62.5 MCG/INH AEPB Inhale 1 puff into the lungs daily. (SHAKE WELL)  . zinc oxide 20 % ointment Apply 1 application topically daily. Apply to local rash at left corner of mouth.  . [DISCONTINUED] cetirizine (ZYRTEC) 10 MG tablet Take 10 mg by mouth daily.  . [DISCONTINUED] fluticasone (FLONASE) 50 MCG/ACT nasal spray Place 2 sprays into both nostrils daily as needed.    No facility-administered encounter medications on file as  of 05/16/2018.     Review of Systems   In general she is not complaining of any fever chills her weight is stable at around 140 pounds.  Skin she does not complain of rashes or itching she was treated for a slight rash left side of her mouth which appears to have resolved with zinc oxide.  Head ears eyes nose mouth and throat is not complaining of any sore throat or visual changes.  Respiratory she is on oxygen but does not really complain of shortness of breath or cough.  Cardiac does not complain of chest pain or edema.  GI is not complaining of abdominal discomfort nausea vomiting diarrhea constipation says her appetite continues to be somewhat spotty.  GU is not complaining of dysuria.  Musculoskeletal does continue to complain of some right hip and leg discomfort which is somewhat chronic as noted above. At times she will also complain of some right neck discomfort  Neurologic does not complain of dizziness or headache or  syncope.  Does have a history of tremors which appears to be relatively well controlled.  Psych does have a history of depression and anxiety this appears to be relatively well controlled again she is followed by psychiatry.    Immunization History  Administered Date(s) Administered  . Influenza, High Dose Seasonal PF 10/04/2016  . Influenza,inj,Quad PF,6+ Mos 01/13/2016  . Influenza-Unspecified 11/10/2017  . Pneumococcal-Unspecified 10/11/2011  . Tdap 01/23/2016   Pertinent  Health Maintenance Due  Topic Date Due  . PNA vac Low Risk Adult (2 of 2 - PCV13) 01/31/2019 (Originally 10/10/2012)  . INFLUENZA VACCINE  08/11/2018  . DEXA SCAN  Discontinued   Fall Risk  01/17/2018 11/09/2017 07/14/2017 04/10/2017 11/08/2016  Falls in the past year? 0 No No Yes Yes  Number falls in past yr: 0 - - 2 or more 2 or more  Injury with Fall? 0 - - Yes Yes  Comment - - - - R hip, L ankle  Risk Factor Category  - - - High Fall Risk -  Follow up Falls evaluation completed - - Falls evaluation completed -      Vitals:   05/16/18 1148  BP: (!) 107/54  Pulse: 62  Resp: 16  Temp: 98.1 F (36.7 C)  TempSrc: Oral  SpO2: 96%  Weight: 140 lb 3.2 oz (63.6 kg)  Height: 5\' 8"  (1.727 m)   Body mass index is 21.32 kg/m. Physical Exam   In general this is a pleasant elderly female in no distress sitting in her wheelchair.  Her skin is warm and dry.  I do note on the back of her neck she has what appears to be a small lipoma it is flesh colored nontender nonerythematous it is not warm.  On the back right side of her neck could not really appreciate any deformity there is some slight tenderness to palpation there is no change in pigmentation She appears to have adequate range of motion of her neck.  Eyes visual acuity appears to be intact sclera and conjunctive are clear.  Oropharynx is clear mucous membranes moist.  Chest is clear to auscultation with somewhat shallow air entry there is no  labored breathing.  Heart is largely regular rate and rhythm without murmur gallop or rub she does not have significant edema.  Musculoskeletal strength appears to be intact all 4 extremities she ambulates in her wheelchair- she does complain of some discomfort with some movement of her right leg  Neurologic appears to be  grossly intact her speech is clear could not really appreciate lateralizing findings touch sensation appears to be intact lower extremities.  Psych she is alert and oriented pleasant and appropriate  Labs reviewed: Recent Labs    12/05/17 03/02/18  NA 141 141  K 5.1 4.5  CL  --  102  CO2  --  29  BUN 16 11  CREATININE 1.1 1.0  CALCIUM  --  8.2   Recent Labs    12/05/17  AST 12*  ALT 8  ALKPHOS 97   Recent Labs    12/05/17 01/24/18 01/29/18  WBC 6.2 8.2 9.0  NEUTROABS 4 7 7   HGB 10.5* 9.7* 10.4*  HCT 31* 29* 30*  PLT 213 294 358   Lab Results  Component Value Date   TSH 4.17 03/02/2018   Lab Results  Component Value Date   HGBA1C 5.2 12/01/2015   Lab Results  Component Value Date   CHOL 107 12/01/2015   HDL 27 (L) 12/01/2015   LDLCALC 59 12/01/2015   TRIG 107 12/01/2015   CHOLHDL 4.0 12/01/2015    Significant Diagnostic Results in last 30 days:  No results found.  Assessment/Plan  #1- history of COPD she continues on PRN Leva albuterol as well as Incruse Ellipta-she is on chronic oxygen-this appears to be stable she does not complain of any increased shortness of breath wheezing or increased cough from baseline.  2.  History of right hip pain as noted above per discussion thought to be more lumbar radiculopathy-she will need follow-up by Dr. Ernestina Patches for a possible nerve block- again this has been complicated by the coronavirus precautions.  She is on Norco 5-325 mg every 12 hours as needed and she takes this frequently she is also on Neurontin 200 mg 3 times daily and has PRN Tylenol- as noted above there is consideration for starting  Cymbalta for coexisting help with depression and pain- but are awaiting nurse practitioner from psych input on this. There is hesitancy to increase her narcotics and this was discussed with Dr. Linna Darner.  3.  History of depression again she continues on Lexapro which was recently increased up to 20 mg a day she is also on Remeron 15 mg nightly she is on BuSpar 3 times a day she is followed by the nurse practitioner from psychiatric services this appears to be stable.  4.  History of tremors she is on primidone twice a day as noted above and this appears to be fairly effective.  5.  History of hypothyroidism she is on Synthroid 25 mcg a day TSH was 4.17 on February lab will monitor periodically.  6.  History of atrial fibrillation she continues on aspirin she is also on digoxin in addition to flecainide this appears to be rate controlled.  History of allergic rhinitis she continues on Flonase as well as Zyrtec.  8.  History of GI bleed in the past there is been no recent evidence of this she is on a proton pump inhibitor hemoglobin was 10.4 on lab done in January- would like to update this when coronavirus precautions are eased-again we are trying to minimize labs because of the coronavirus threat.  9.  Neck pain will continue to monitor exam was benign this evening but we will continue to watch --her main pain complaint appears to be more chronic in the right leg hip area.  In regards to the lump on the back of her neck this appears to be a lipoma it is flesh-colored  nontender nonerythematous will monitor  CPT- [phone]

## 2018-05-17 DIAGNOSIS — M6281 Muscle weakness (generalized): Secondary | ICD-10-CM | POA: Diagnosis not present

## 2018-05-17 DIAGNOSIS — G629 Polyneuropathy, unspecified: Secondary | ICD-10-CM | POA: Diagnosis not present

## 2018-05-17 DIAGNOSIS — M25512 Pain in left shoulder: Secondary | ICD-10-CM | POA: Diagnosis not present

## 2018-05-18 DIAGNOSIS — G4700 Insomnia, unspecified: Secondary | ICD-10-CM | POA: Diagnosis not present

## 2018-05-18 DIAGNOSIS — G629 Polyneuropathy, unspecified: Secondary | ICD-10-CM | POA: Diagnosis not present

## 2018-05-18 DIAGNOSIS — F331 Major depressive disorder, recurrent, moderate: Secondary | ICD-10-CM | POA: Diagnosis not present

## 2018-05-18 DIAGNOSIS — M6281 Muscle weakness (generalized): Secondary | ICD-10-CM | POA: Diagnosis not present

## 2018-05-18 DIAGNOSIS — M25512 Pain in left shoulder: Secondary | ICD-10-CM | POA: Diagnosis not present

## 2018-05-18 DIAGNOSIS — R630 Anorexia: Secondary | ICD-10-CM | POA: Diagnosis not present

## 2018-05-18 DIAGNOSIS — F419 Anxiety disorder, unspecified: Secondary | ICD-10-CM | POA: Diagnosis not present

## 2018-05-19 DIAGNOSIS — M6281 Muscle weakness (generalized): Secondary | ICD-10-CM | POA: Diagnosis not present

## 2018-05-19 DIAGNOSIS — M25512 Pain in left shoulder: Secondary | ICD-10-CM | POA: Diagnosis not present

## 2018-05-19 DIAGNOSIS — G629 Polyneuropathy, unspecified: Secondary | ICD-10-CM | POA: Diagnosis not present

## 2018-05-21 DIAGNOSIS — M6281 Muscle weakness (generalized): Secondary | ICD-10-CM | POA: Diagnosis not present

## 2018-05-21 DIAGNOSIS — G629 Polyneuropathy, unspecified: Secondary | ICD-10-CM | POA: Diagnosis not present

## 2018-05-21 DIAGNOSIS — M25512 Pain in left shoulder: Secondary | ICD-10-CM | POA: Diagnosis not present

## 2018-05-22 ENCOUNTER — Other Ambulatory Visit: Payer: Self-pay

## 2018-05-22 DIAGNOSIS — M25512 Pain in left shoulder: Secondary | ICD-10-CM | POA: Diagnosis not present

## 2018-05-22 DIAGNOSIS — M6281 Muscle weakness (generalized): Secondary | ICD-10-CM | POA: Diagnosis not present

## 2018-05-22 DIAGNOSIS — G629 Polyneuropathy, unspecified: Secondary | ICD-10-CM | POA: Diagnosis not present

## 2018-05-22 MED ORDER — HYDROCODONE-ACETAMINOPHEN 5-325 MG PO TABS: 1.0000 | ORAL_TABLET | Freq: Two times a day (BID) | ORAL | 0 refills | Status: DC | PRN

## 2018-05-24 DIAGNOSIS — M6281 Muscle weakness (generalized): Secondary | ICD-10-CM | POA: Diagnosis not present

## 2018-05-24 DIAGNOSIS — M25512 Pain in left shoulder: Secondary | ICD-10-CM | POA: Diagnosis not present

## 2018-05-24 DIAGNOSIS — G629 Polyneuropathy, unspecified: Secondary | ICD-10-CM | POA: Diagnosis not present

## 2018-05-25 DIAGNOSIS — M25512 Pain in left shoulder: Secondary | ICD-10-CM | POA: Diagnosis not present

## 2018-05-25 DIAGNOSIS — M6281 Muscle weakness (generalized): Secondary | ICD-10-CM | POA: Diagnosis not present

## 2018-05-25 DIAGNOSIS — G629 Polyneuropathy, unspecified: Secondary | ICD-10-CM | POA: Diagnosis not present

## 2018-05-28 DIAGNOSIS — M25512 Pain in left shoulder: Secondary | ICD-10-CM | POA: Diagnosis not present

## 2018-05-28 DIAGNOSIS — G629 Polyneuropathy, unspecified: Secondary | ICD-10-CM | POA: Diagnosis not present

## 2018-05-28 DIAGNOSIS — M6281 Muscle weakness (generalized): Secondary | ICD-10-CM | POA: Diagnosis not present

## 2018-05-29 ENCOUNTER — Other Ambulatory Visit: Payer: Self-pay

## 2018-05-29 ENCOUNTER — Encounter: Payer: Self-pay | Admitting: Physical Medicine and Rehabilitation

## 2018-05-29 ENCOUNTER — Ambulatory Visit: Payer: Self-pay

## 2018-05-29 ENCOUNTER — Ambulatory Visit (INDEPENDENT_AMBULATORY_CARE_PROVIDER_SITE_OTHER): Payer: Medicare Other | Admitting: Physical Medicine and Rehabilitation

## 2018-05-29 VITALS — BP 133/73 | HR 59

## 2018-05-29 DIAGNOSIS — M25512 Pain in left shoulder: Secondary | ICD-10-CM | POA: Diagnosis not present

## 2018-05-29 DIAGNOSIS — G629 Polyneuropathy, unspecified: Secondary | ICD-10-CM | POA: Diagnosis not present

## 2018-05-29 DIAGNOSIS — M6281 Muscle weakness (generalized): Secondary | ICD-10-CM | POA: Diagnosis not present

## 2018-05-29 DIAGNOSIS — M47816 Spondylosis without myelopathy or radiculopathy, lumbar region: Secondary | ICD-10-CM | POA: Diagnosis not present

## 2018-05-29 MED ORDER — METHYLPREDNISOLONE ACETATE 80 MG/ML IJ SUSP
80.0000 mg | Freq: Once | INTRAMUSCULAR | Status: AC
  Administered 2018-05-29: 80 mg

## 2018-05-29 NOTE — Progress Notes (Signed)
Dawn [name] - 78 y.o. female MRN [id number]  Date of birth: 1940-05-28  Office Visit Note: Visit Date: 05/29/2018 PCP: [name], MD Referred by: [name], MD  Subjective: Chief Complaint  Patient presents with  . Right Hip - Pain   HPI: Dawn [name] is a 78 y.o. female who comes in today For planned right L3-4 and L4-5 and L5-S1 facet joint block.  Patient is followed by Dr. Eduard Roux from orthopedic standpoint.  She had a prior right total hip replacement performed by Dr. Ihor Gully.  She continued to have hip pain.  Initial injection of the sacroiliac joint gave her temporary relief but it was very short-lived.  Epidural injection was not very beneficial at all.  She does have facet arthritis and arthropathy on the MRI she has several levels of lateral recess narrowing but no high-grade stenosis.  We are going to try diagnostic facet joint blocks to see if we get out a reason for her right hip pain.  Would consider L5 transforaminal injection if she is still trying to figure out the source of pain if this is not beneficial.  If this is beneficial she might be ultimately a candidate for radiofrequency ablation.  She suffers from a host of other medical complications including heart failure and COPD and she is in a nursing home currently.  ROS Otherwise per HPI.  Assessment & Plan: Visit Diagnoses:  1. Spondylosis without myelopathy or radiculopathy, lumbar region     Plan: No additional findings.   Meds & Orders:  Meds ordered this encounter  Medications  . methylPREDNISolone acetate (DEPO-MEDROL) injection 80 mg    Orders Placed This Encounter  Procedures  . Facet Injection  . XR C-ARM NO REPORT    Follow-up: Return if symptoms worsen or fail to improve, for Eduard Roux, MD.   Procedures: No procedures performed  Lumbar Facet Joint Intra-Articular Injection(s) with Fluoroscopic Guidance  Patient: Dawn [name]      Date of Birth: 05-05-1940  MRN: [id number] PCP: [name], MD      Visit Date: 05/29/2018   Universal Protocol:    Date/Time: 05/29/2018  Consent Given By: the patient  Position: PRONE   Additional Comments: Vital signs were monitored before and after the procedure. Patient was prepped and draped in the usual sterile fashion. The correct patient, procedure, and site was verified.   Injection Procedure Details:  Procedure Site One Meds Administered:  Meds ordered this encounter  Medications  . methylPREDNISolone acetate (DEPO-MEDROL) injection 80 mg     Laterality: Right  Location/Site:  L3-L4 L4-L5 L5-S1  Needle size: 22 guage  Needle type: Spinal  Needle Placement: Articular  Findings:  -Comments: Excellent flow of contrast producing a partial arthrogram.  Procedure Details: The fluoroscope beam is vertically oriented in AP, and the inferior recess is visualized beneath the lower pole of the inferior apophyseal process, which represents the target point for needle insertion. When direct visualization is difficult the target point is located at the medial projection of the vertebral pedicle. The region overlying each aforementioned target is locally anesthetized with a 1 to 2 ml. volume of 1% Lidocaine without Epinephrine.   The spinal needle was inserted into each of the above mentioned facet joints using biplanar fluoroscopic guidance. A 0.25 to 0.5 ml. volume of Isovue-250 was injected and a partial facet joint arthrogram was obtained. A single spot film was obtained of the resulting arthrogram.    One to  1.25 ml of the steroid/anesthetic solution was then injected into each of the facet joints noted above.   Additional Comments:  The patient tolerated the procedure well Dressing: 2 x 2 sterile gauze and Band-Aid    Post-procedure details: Patient was observed during the procedure. Post-procedure instructions were reviewed.  Patient left the clinic in stable condition.     Clinical History: MRI LUMBAR SPINE WITHOUT CONTRAST  TECHNIQUE: Multiplanar, multisequence MR imaging of the lumbar spine was performed. No intravenous contrast was administered.  COMPARISON:  Lumbar radiographs 07/18/2017  FINDINGS: Segmentation:  Normal  Alignment: Moderate levoscoliosis. Mild retrolisthesis L2-3 and L3-4. 4 mm anterolisthesis L5-S1  Vertebrae:  Negative for fracture or mass.  Conus medullaris and cauda equina: Conus extends to the L2 level. Conus and cauda equina appear normal.  Paraspinal and other soft tissues: 6 cm left renal cyst. No paraspinous mass.  Disc levels:  T12-L1: Moderate disc degeneration and spurring with right foraminal stenosis  L1-2: Disc degeneration and spurring right greater than left with moderate right foraminal encroachment.  L2-3: Disc degeneration and diffuse bulging of the disc. Mild spinal stenosis. Moderate subarticular stenosis bilaterally right greater than left due to spurring.  L3-4: Disc degeneration and spondylosis with mild facet degeneration. Mild subarticular stenosis bilaterally  L4-5: Disc degeneration and spurring. Marked facet hypertrophy on the left. Severe subarticular foraminal stenosis on the left due to spurring.  L5-S1: Mild anterolisthesis. Bilateral facet degeneration with mild subarticular stenosis bilaterally.  IMPRESSION: Lumbar scoliosis with multilevel degenerative change causing stenosis as described above. The most severe stenosis is on the right at L4-5. There is right foraminal stenosis at T12-L1 L1-2 and L2-3.   Electronically Signed   By: Franchot Gallo M.D.   On: 11/22/2017 16:16   She reports that she quit smoking about 2 years ago. She has never used smokeless tobacco. No results for input(s): HGBA1C, LABURIC in the last 8760 hours.  Objective:  VS:  HT:    WT:   BMI:     BP:133/73  HR:(!) 59bpm  TEMP: ( )  RESP:  Physical Exam  Ortho Exam Imaging:  Xr C-arm No Report  Result Date: 05/29/2018 Please see Notes tab for imaging impression.   Past Medical/Family/Surgical/Social History: Medications & Allergies reviewed per EMR, new medications updated. Patient Active Problem List   Diagnosis Date Noted  . Chronic radicular cervical pain 04/26/2018  . Chronic right-sided low back pain with right-sided sciatica 03/21/2018  . Spinal stenosis 11/23/2017  . Headache 08/31/2017  . Elevated TSH 08/31/2017  . Pain in right hip 07/18/2017  . Right-sided low back pain with right-sided sciatica 07/18/2017  . Abnormal chest x-ray 04/04/2017  . Tremor 04/04/2017  . Bimalleolar ankle fracture, left, sequela 10/11/2016  . Altered mental status 10/03/2016  . Neuropathy 10/03/2016  . Chronic diastolic CHF (congestive heart failure) ([place]) 10/03/2016  . Acute lower UTI   . Recurrent falls 02/02/2016  . 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 Upper GI bleed 01/23/2016  . Blood loss anemia 01/23/2016  . Acute diastolic CHF (congestive heart failure) ([place]) 01/23/2016  . Heme + stool   . Abnormal CT scan, colon   . 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 Hypoxia   . At risk for adverse drug reaction 01/18/2016  . Subcapital fracture of right hip, closed 01/12/2016  . Alteration consciousness 11/30/2015  . SVT (supraventricular tachycardia) ([place]) 11/27/2015  . Diarrhea 11/27/2015  . Depression, recurrent ([place]) 11/27/2015  . Polyneuropathy 11/27/2015  . Renal  insufficiency   . Protein-calorie malnutrition, severe 11/18/2015  . Intra-abdominal fluid collection   . Premature atrial contractions   . Functional diarrhea   . Sepsis ([place])   . Hyperthyroidism   . Hypotension   . Septic shock ([place])   . Acute encephalopathy   . Hypokalemia   . Acute delirium 11/10/2015  . AKI (acute kidney injury) ([place]) 11/10/2015  . Anemia, iron deficiency 11/10/2015  . Anxiety 11/10/2015  . Chronic pain syndrome 11/10/2015  . Recurrent UTI 11/10/2015   . Acute respiratory failure with hypoxia ([place]) 11/10/2015  . COPD (chronic obstructive pulmonary disease) ([place]) 11/10/2015  . Acute hyperglycemia 11/10/2015  . CKD (chronic kidney disease) stage 3, GFR 30-59 ml/min ([facility]) 11/10/2015  . Narrow complex tachycardia ([place]) 11/10/2015  . HTN (hypertension) 11/10/2015  . Generalized weakness 11/10/2015  . Anemia   . Delirium   . Hypoxemia   . Tachycardia    Past Medical History:  Diagnosis Date  . COPD (chronic obstructive pulmonary disease) ([place])   . History of anxiety   . History of depression   . Spinal stenosis   . UTI (urinary tract infection)    Family History  Problem Relation Age of Onset  . Heart attack Mother   . Diabetes Father   . Breast cancer Sister   . Thyroid disease Neg Hx    Past Surgical History:  Procedure Laterality Date  . ANKLE FRACTURE SURGERY Left   . CATARACT EXTRACTION, BILATERAL    . FLEXIBLE SIGMOIDOSCOPY Left 11/18/2015   Procedure: FLEXIBLE SIGMOIDOSCOPY;  Surgeon: [name], MD;  Location: [facility];  Service: Endoscopy;  Laterality: Left;  . FLEXIBLE SIGMOIDOSCOPY N/A 11/20/2015   Procedure: FLEXIBLE SIGMOIDOSCOPY;  Surgeon: [name], MD;  Location[facility] ENDOSCOPY;  Service: Endoscopy;  Laterality: N/A;  . HIP ARTHROPLASTY Right 01/12/2016   Procedure: ARTHROPLASTY BIPOLAR HIP (HEMIARTHROPLASTY);  Surgeon: [name], MD;  Location: [facility];  Service: Orthopedics;  Laterality: Right;   Social History   Occupational History  . 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 Smoking status: Former Smoker    Last attempt to quit: 12/11/2015    Years since quitting: 2.4  . Smokeless tobacco: Never Used  . Tobacco comment: Smoked age [age] up to < 1 ppd, usually half a pack per day, mainly one half pack per day  Substance and Sexual Activity  . Alcohol use: No  . Drug use: No  . Sexual activity: Not Currently

## 2018-05-29 NOTE — Procedures (Signed)
Lumbar Facet Joint Intra-Articular Injection(s) with Fluoroscopic Guidance  Patient: Dawn [name]      Date of Birth: [birth year] MRN: [id number] PCP: [name], MD      Visit Date: 05/29/2018   Universal Protocol:    Date/Time: 05/29/2018  Consent Given By: the patient  Position: PRONE   Additional Comments: Vital signs were monitored before and after the procedure. Patient was prepped and draped in the usual sterile fashion. The correct patient, procedure, and site was verified.   Injection Procedure Details:  Procedure Site One Meds Administered:  Meds ordered this encounter  Medications  . methylPREDNISolone acetate (DEPO-MEDROL) injection 80 mg     Laterality: Right  Location/Site:  L3-L4 L4-L5 L5-S1  Needle size: 22 guage  Needle type: Spinal  Needle Placement: Articular  Findings:  -Comments: Excellent flow of contrast producing a partial arthrogram.  Procedure Details: The fluoroscope beam is vertically oriented in AP, and the inferior recess is visualized beneath the lower pole of the inferior apophyseal process, which represents the target point for needle insertion. When direct visualization is difficult the target point is located at the medial projection of the vertebral pedicle. The region overlying each aforementioned target is locally anesthetized with a 1 to 2 ml. volume of 1% Lidocaine without Epinephrine.   The spinal needle was inserted into each of the above mentioned facet joints using biplanar fluoroscopic guidance. A 0.25 to 0.5 ml. volume of Isovue-250 was injected and a partial facet joint arthrogram was obtained. A single spot film was obtained of the resulting arthrogram.    One to 1.25 ml of the steroid/anesthetic solution was then injected into each of the facet joints noted above.   Additional Comments:  The patient tolerated the procedure well Dressing: 2 x 2 sterile gauze and Band-Aid    Post-procedure  details: Patient was observed during the procedure. Post-procedure instructions were reviewed.  Patient left the clinic in stable condition.

## 2018-05-29 NOTE — Progress Notes (Signed)
.  Numeric Pain Rating Scale and Functional Assessment Average Pain 9   In the last MONTH (on 0-10 scale) has pain interfered with the following?  1. General activity like being  able to carry out your everyday physical activities such as walking, climbing stairs, carrying groceries, or moving a chair?  Rating(8)   +Driver, -BT, -Dye Allergies.  

## 2018-05-30 DIAGNOSIS — G629 Polyneuropathy, unspecified: Secondary | ICD-10-CM | POA: Diagnosis not present

## 2018-05-30 DIAGNOSIS — M6281 Muscle weakness (generalized): Secondary | ICD-10-CM | POA: Diagnosis not present

## 2018-05-30 DIAGNOSIS — M25512 Pain in left shoulder: Secondary | ICD-10-CM | POA: Diagnosis not present

## 2018-05-31 DIAGNOSIS — G629 Polyneuropathy, unspecified: Secondary | ICD-10-CM | POA: Diagnosis not present

## 2018-05-31 DIAGNOSIS — M6281 Muscle weakness (generalized): Secondary | ICD-10-CM | POA: Diagnosis not present

## 2018-05-31 DIAGNOSIS — M25512 Pain in left shoulder: Secondary | ICD-10-CM | POA: Diagnosis not present

## 2018-06-01 ENCOUNTER — Other Ambulatory Visit: Payer: Self-pay | Admitting: Adult Health

## 2018-06-01 MED ORDER — HYDROCODONE-ACETAMINOPHEN 5-325 MG PO TABS: 1.0000 | ORAL_TABLET | Freq: Two times a day (BID) | ORAL | 0 refills | Status: DC | PRN

## 2018-06-08 ENCOUNTER — Encounter: Payer: Self-pay | Admitting: Adult Health

## 2018-06-08 ENCOUNTER — Non-Acute Institutional Stay (SKILLED_NURSING_FACILITY): Payer: Medicare Other | Admitting: Adult Health

## 2018-06-08 DIAGNOSIS — J410 Simple chronic bronchitis: Secondary | ICD-10-CM

## 2018-06-08 DIAGNOSIS — G8929 Other chronic pain: Secondary | ICD-10-CM | POA: Diagnosis not present

## 2018-06-08 DIAGNOSIS — R251 Tremor, unspecified: Secondary | ICD-10-CM

## 2018-06-08 DIAGNOSIS — F419 Anxiety disorder, unspecified: Secondary | ICD-10-CM | POA: Diagnosis not present

## 2018-06-08 DIAGNOSIS — F339 Major depressive disorder, recurrent, unspecified: Secondary | ICD-10-CM

## 2018-06-08 DIAGNOSIS — I471 Supraventricular tachycardia: Secondary | ICD-10-CM | POA: Diagnosis not present

## 2018-06-08 NOTE — Progress Notes (Signed)
Location:  [facility] Room Number: 202/A Place of Service:  SNF (31) Provider:  Durenda Age, DNP, FNP-BC  Patient Care Team: [name], MD as PCP - General (Internal Medicine) Medina-Vargas, [name], NP as Nurse Practitioner (Internal Medicine)  Extended Emergency Contact Information Primary Emergency Contact: Hatfield,Lisa Address: [address]          [place], [place] [address] [place] Work Phone: [phone] Mobile Phone: [phone] Relation: Daughter  Code Status:  Full Code  Goals of care: Advanced Directive information Advanced Directives 06/08/2018  Does Patient Have a Medical Advance Directive? Yes  Type of Advance Directive Out of facility DNR (pink MOST or yellow form)  Does patient want to make changes to medical advance directive? No - Patient declined  Would patient like information on creating a medical advance directive? -     Chief Complaint  Patient presents with  . Medical Management of Chronic Issues    Routine visit of medical management    HPI:  Pt is a 78 y.o. female seen today for medical management of chronic diseases. She has PMH of COPD, anemia, chronic pain syndrome and depression. She was seen in her room today. She was not using oxygen. She denies having SOB nor wheezing. No tremors noted. She verbalized that sometimes she would have tremors. She denies feeling depressed.   Past Medical History:  Diagnosis Date  . COPD (chronic obstructive pulmonary disease) ([place])   . History of anxiety   . History of depression   . Spinal stenosis   . UTI (urinary tract infection)    Past Surgical History:  Procedure Laterality Date  . ANKLE FRACTURE SURGERY Left   . CATARACT EXTRACTION, BILATERAL    . FLEXIBLE SIGMOIDOSCOPY Left 11/18/2015   Procedure: FLEXIBLE SIGMOIDOSCOPY;  Surgeon: [name], MD;  Location: [facility];  Service: Endoscopy;  Laterality: Left;  . FLEXIBLE SIGMOIDOSCOPY N/A  11/20/2015   Procedure: FLEXIBLE SIGMOIDOSCOPY;  Surgeon: [name], MD;  Location: [facility] ENDOSCOPY;  Service: Endoscopy;  Laterality: N/A;  . HIP ARTHROPLASTY Right 01/12/2016   Procedure: ARTHROPLASTY BIPOLAR HIP (HEMIARTHROPLASTY);  Surgeon: [name], MD;  Location: [facility];  Service: Orthopedics;  Laterality: Right;    Allergies  Allergen Reactions  . Biofreeze [Menthol (Topical Analgesic)] Rash    By history Aspercreme does not cause a rash    Outpatient Encounter Medications as of 06/08/2018  Medication Sig  . acetaminophen (TYLENOL) 325 MG tablet Take 2 tablets (650 mg total) by mouth every 8 (eight) hours as needed.  Marland Kitchen aspirin 81 MG chewable tablet Chew 81 mg by mouth 2 (two) times daily.  . busPIRone (BUSPAR) 10 MG tablet Take 10 mg by mouth daily. FOR ANXIETY  . busPIRone (BUSPAR) 5 MG tablet Take 5 mg by mouth 2 (two) times daily.   . digoxin (LANOXIN) 0.125 MG tablet Take 0.125 mg by mouth daily. Hold if HR <60  . docusate sodium (COLACE) 100 MG capsule Take 1 capsule (100 mg total) by mouth 2 (two) times daily as needed for mild constipation.  . DULoxetine (CYMBALTA) 30 MG capsule Take 30 mg by mouth daily. for depression/pain  . escitalopram (LEXAPRO) 20 MG tablet 10 mg. TAKE 1 TABLET BY MOUTH ONCE DAILY FOR DEPRESSION   . flecainide (TAMBOCOR) 50 MG tablet Take 50 mg by mouth every 12 (twelve) hours.  . gabapentin (NEURONTIN) 100 MG capsule Take 200 mg by mouth 3 (three) times daily.  Marland Kitchen HYDROcodone-acetaminophen (NORCO/VICODIN) 5-325  MG tablet Take 1 tablet by mouth every 12 (twelve) hours as needed for up to 30 doses for moderate pain. T  . levalbuterol (XOPENEX) 1.25 MG/3ML nebulizer solution Take 1.25 mg by nebulization every 6 (six) hours as needed for wheezing.   Marland Kitchen levothyroxine (SYNTHROID, LEVOTHROID) 25 MCG tablet Take 25 mcg by mouth daily before breakfast.  . mirtazapine (REMERON) 15 MG tablet Take 15 mg by mouth at bedtime. FOR DEPRESSION AND APPETITE  . Multiple  Vitamins-Minerals (MULTIVITAMIN WITH MINERALS) tablet Take 1 tablet by mouth daily.  . naphazoline-glycerin (CLEAR EYES REDNESS RELIEF) 0.012-0.2 % SOLN Place 1 drop into both eyes 2 (two) times daily.  Marland Kitchen NUTRITIONAL SUPPLEMENT LIQD Take 120 mLs by mouth 2 (two) times daily. MedPass  . ondansetron (ZOFRAN) 4 MG tablet Take 4 mg by mouth 2 (two) times daily.  . OXYGEN Inhale 2 L into the lungs at bedtime.  . pantoprazole (PROTONIX) 40 MG tablet Take 40 mg by mouth daily.  . polyethylene glycol (MIRALAX / GLYCOLAX) packet Take 17 g by mouth daily.  . primidone (MYSOLINE) 50 MG tablet Take 50-100 mg by mouth See admin instructions. TAKE 2 TABLETS (100MG ) BY MOUTH EVERY MORNING;TAKE 1 TABLET BY MOUTH AT BEDTIME  . sennosides-docusate sodium (SENOKOT-S) 8.6-50 MG tablet Take 2 tablets by mouth at bedtime.   Loura Pardon Salicylate (ASPERCREME EX) Apply 1 application topically 2 (two) times daily as needed (Posterior neck muscle spasm).   Marland Kitchen umeclidinium bromide (INCRUSE ELLIPTA) 62.5 MCG/INH AEPB Inhale 1 puff into the lungs daily. (SHAKE WELL)  . zinc oxide 20 % ointment Apply 1 application topically daily. Apply to local rash at left corner of mouth.   No facility-administered encounter medications on file as of 06/08/2018.     Review of Systems  GENERAL: No change in appetite, no fatigue, no weight changes, no fever, chills or weakness MOUTH and THROAT: Denies oral discomfort, gingival pain or bleeding, pain from teeth or hoarseness   RESPIRATORY: no cough, SOB, DOE, wheezing, hemoptysis CARDIAC: No chest pain, edema or palpitations GI: No abdominal pain, diarrhea, constipation, heart burn, nausea or vomiting GU: Denies dysuria, frequency, hematuria, incontinence, or discharge NEUROLOGICAL: Denies dizziness, syncope, numbness, or headache PSYCHIATRIC: Denies feelings of depression or anxiety. No report of hallucinations, insomnia, paranoia, or agitation    Immunization History  Administered  Date(s) Administered  . Influenza, High Dose Seasonal PF 10/04/2016  . Influenza,inj,Quad PF,6+ Mos 01/13/2016  . Influenza-Unspecified 11/10/2017  . Pneumococcal-Unspecified 10/11/2011  . Tdap 01/23/2016   Pertinent  Health Maintenance Due  Topic Date Due  . PNA vac Low Risk Adult (2 of 2 - PCV13) 01/31/2019 (Originally 10/10/2012)  . INFLUENZA VACCINE  08/11/2018  . DEXA SCAN  Discontinued   Fall Risk  01/17/2018 11/09/2017 07/14/2017 04/10/2017 11/08/2016  Falls in the past year? 0 No No Yes Yes  Number falls in past yr: 0 - - 2 or more 2 or more  Injury with Fall? 0 - - Yes Yes  Comment - - - - R hip, L ankle  Risk Factor Category  - - - High Fall Risk -  Follow up Falls evaluation completed - - Falls evaluation completed -     Vitals:   06/08/18 0950  BP: 110/67  Pulse: 72  Resp: 20  Temp: 98.7 F (37.1 C)  TempSrc: Oral  SpO2: 96%  Weight: 140 lb 3.2 oz (63.6 kg)  Height: 5\' 8"  (1.727 m)   Body mass index is 21.32 kg/m.  Physical  Exam  GENERAL APPEARANCE: Well nourished. In no acute distress. Normal body habitus SKIN:  Skin is warm and dry.  MOUTH and THROAT: Lips are without lesions. Oral mucosa is moist and without lesions. Tongue is normal in shape, size, and color and without lesions RESPIRATORY: Breathing is even & unlabored, BS CTAB CARDIAC: RRR, no murmur,no extra heart sounds, no edema GI: Abdomen soft, normal BS, no masses, no tenderness EXTREMITIES:  Able to move X 4 extremities NEUROLOGICAL: There is no tremor. Speech is clear. Alert and oriented X 3. PSYCHIATRIC:  Affect and behavior are appropriate   Labs reviewed: Recent Labs    12/05/17 03/02/18  NA 141 141  K 5.1 4.5  CL  --  102  CO2  --  29  BUN 16 11  CREATININE 1.1 1.0  CALCIUM  --  8.2   Recent Labs    12/05/17  AST 12*  ALT 8  ALKPHOS 97   Recent Labs    12/05/17 01/24/18 01/29/18  WBC 6.2 8.2 9.0  NEUTROABS 4 7 7   HGB 10.5* 9.7* 10.4*  HCT 31* 29* 30*  PLT 213 294 358    Lab Results  Component Value Date   TSH 4.17 03/02/2018   Lab Results  Component Value Date   HGBA1C 5.2 12/01/2015   Lab Results  Component Value Date   CHOL 107 12/01/2015   HDL 27 (L) 12/01/2015   LDLCALC 59 12/01/2015   TRIG 107 12/01/2015   CHOLHDL 4.0 12/01/2015    Significant Diagnostic Results in last 30 days:  Xr C-arm No Report  Result Date: 05/29/2018 Please see Notes tab for imaging impression.   Assessment/Plan  1. Tremor - no noted tremor today, continue primidone 50 mg 2 tabs = 100 mg at bedtime  2. Other chronic pain - had a recent nerve block, 5/19, continue gabapentin 100 mg 2 capsules = 200 mg 3 times a day, Cymbalta 30 mg 1 capsule daily and Norco 5-325 mg 1 tab every 12 hours as needed  3. 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 Depression, recurrent ([place]) -mood is stable, continue escitalopram 10 mg 1 time daily and mirtazapine 15 mg 1 tab at bedtime  6. Anxiety -Mood is stable, continue buspirone 10 mg 1 tab daily and 5 mg twice a day   Family/ staff Communication:   Discussed plan of care with resident.  Labs/tests ordered:  None  Goals of care:   Long-term care   Durenda Age, DNP, FNP-BC [facility] [phone] (Monday-Friday 8:00 a.m. - 5:00 p.m.) [phone] (after hours)

## 2018-06-14 ENCOUNTER — Non-Acute Institutional Stay (SKILLED_NURSING_FACILITY): Payer: Medicare Other | Admitting: Internal Medicine

## 2018-06-14 ENCOUNTER — Encounter: Payer: Self-pay | Admitting: Internal Medicine

## 2018-06-14 DIAGNOSIS — L309 Dermatitis, unspecified: Secondary | ICD-10-CM | POA: Diagnosis not present

## 2018-06-14 DIAGNOSIS — E038 Other specified hypothyroidism: Secondary | ICD-10-CM | POA: Diagnosis not present

## 2018-06-14 DIAGNOSIS — R251 Tremor, unspecified: Secondary | ICD-10-CM

## 2018-06-14 DIAGNOSIS — I471 Supraventricular tachycardia: Secondary | ICD-10-CM

## 2018-06-14 NOTE — Progress Notes (Signed)
Location:    [facility] Room Number: 202/A Place of Service:  SNF [phone]) Provider:  Loraine Maple, MD  Patient Care Team: [name], MD as PCP - General (Internal Medicine) Medina-Vargas, [name], NP as Nurse Practitioner (Internal Medicine)  Extended Emergency Contact Information Primary Emergency Contact: Hatfield,Lisa Address: [address]          [place], [place] [address] [place] Work Phone: [phone] Mobile Phone: [phone] Relation: Daughter  Code Status:  Full Code Goals of care: Advanced Directive information Advanced Directives 06/14/2018  Does Patient Have a Medical Advance Directive? Yes  Type of Advance Directive Out of facility DNR (pink MOST or yellow form)  Does patient want to make changes to medical advance directive? No - Patient declined  Would patient like information on creating a medical advance directive? -     Chief Complaint  Patient presents with  . Acute Visit    F/U Dermatitis     HPI:  Pt is a 78 y.o. female seen today for an acute visit for suspected angular cheilitis.  Patient was seen approximately 6 weeks ago for some rashes he says of her mouth-this was more so on the left side- zinc oxide was prescribed- patient feels that it is still persistent-however speaking with her nurse today she feels it is gradually improving.  She does not really complain of any pain with this or discomfort no evidence of progression here or signs of cellulitis.   also noted her digoxin was held earlier today because pulse was noted to be in the high 40s this was taken by machine however-when I checked it shortly thereafter it was in the 70s and this was confirmed it appears by another machine.  I did review her pulses in it appears they are largely in the 60s to 80s range I suspect this was more of a machine malfunction.  She is on digoxin with a history of paroxysmal   junctional tachycardia which appears rate controlled she is also on flecainide in addition to digoxin.  Digoxin was 0.9 on lab done in February  Pressures appear to be stable as well 134/70-- 1 22/79-114/69.  She does not complain of any dizziness or syncope.  Her other medical conditions include COPD anemia chronic pain syndrome and depression has hypothyroidism and of tremor    Past Medical History:  Diagnosis Date  . COPD (chronic obstructive pulmonary disease) ([place])   . History of anxiety   . History of depression   . Spinal stenosis   . UTI (urinary tract infection)    Past Surgical History:  Procedure Laterality Date  . ANKLE FRACTURE SURGERY Left   . CATARACT EXTRACTION, BILATERAL    . FLEXIBLE SIGMOIDOSCOPY Left 11/18/2015   Procedure: FLEXIBLE SIGMOIDOSCOPY;  Surgeon: [name], MD;  Location: [facility];  Service: Endoscopy;  Laterality: Left;  . FLEXIBLE SIGMOIDOSCOPY N/A 11/20/2015   Procedure: FLEXIBLE SIGMOIDOSCOPY;  Surgeon: [name], MD;  Location: [facility] ENDOSCOPY;  Service: Endoscopy;  Laterality: N/A;  . HIP ARTHROPLASTY Right 01/12/2016   Procedure: ARTHROPLASTY BIPOLAR HIP (HEMIARTHROPLASTY);  Surgeon: [name], MD;  [facility];  Service: Orthopedics;  Laterality: Right;    Allergies  Allergen Reactions  . Biofreeze [Menthol (Topical Analgesic)] Rash    By history Aspercreme does not cause a rash    Outpatient Encounter Medications as of 06/14/2018  Medication Sig  . acetaminophen (TYLENOL) 325 MG  tablet Take 2 tablets (650 mg total) by mouth every 8 (eight) hours as needed.  Marland Kitchen aspirin 81 MG chewable tablet Chew 81 mg by mouth 2 (two) times daily.  . busPIRone (BUSPAR) 10 MG tablet Take 10 mg by mouth daily. FOR ANXIETY  . busPIRone (BUSPAR) 5 MG tablet Take 5 mg by mouth 2 (two) times daily.   . digoxin (LANOXIN) 0.125 MG tablet Take 0.125 mg by mouth daily. Hold if HR <60  . docusate sodium (COLACE) 100 MG capsule Take 1 capsule (100 mg total)  by mouth 2 (two) times daily as needed for mild constipation.  . DULoxetine (CYMBALTA) 30 MG capsule Take 30 mg by mouth daily. for depression/pain  . escitalopram (LEXAPRO) 20 MG tablet 10 mg. TAKE 1 TABLET BY MOUTH ONCE DAILY FOR DEPRESSION   . flecainide (TAMBOCOR) 50 MG tablet Take 50 mg by mouth every 12 (twelve) hours.  . gabapentin (NEURONTIN) 100 MG capsule Take 200 mg by mouth 3 (three) times daily.  Marland Kitchen HYDROcodone-acetaminophen (NORCO/VICODIN) 5-325 MG tablet Take 1 tablet by mouth every 12 (twelve) hours as needed for up to 30 doses for moderate pain. T  . levalbuterol (XOPENEX) 1.25 MG/3ML nebulizer solution Take 1.25 mg by nebulization every 6 (six) hours as needed for wheezing.   Marland Kitchen levothyroxine (SYNTHROID, LEVOTHROID) 25 MCG tablet Take 25 mcg by mouth daily before breakfast.  . mirtazapine (REMERON) 15 MG tablet Take 15 mg by mouth at bedtime. FOR DEPRESSION AND APPETITE  . Multiple Vitamins-Minerals (MULTIVITAMIN WITH MINERALS) tablet Take 1 tablet by mouth daily.  . naphazoline-glycerin (CLEAR EYES REDNESS RELIEF) 0.012-0.2 % SOLN Place 1 drop into both eyes 2 (two) times daily.  Marland Kitchen NUTRITIONAL SUPPLEMENT LIQD Take 120 mLs by mouth 2 (two) times daily. MedPass  . ondansetron (ZOFRAN) 4 MG tablet Take 4 mg by mouth 2 (two) times daily.  . OXYGEN Inhale 2 L into the lungs at bedtime.  . pantoprazole (PROTONIX) 40 MG tablet Take 40 mg by mouth daily.  . polyethylene glycol (MIRALAX / GLYCOLAX) packet Take 17 g by mouth daily.  . primidone (MYSOLINE) 50 MG tablet TAKE 2 TABLETS (100MG ) BY MOUTH EVERY MORNING  . primidone (MYSOLINE) 50 MG tablet TAKE 1 TABLET BY MOUTH AT BEDTIME  . sennosides-docusate sodium (SENOKOT-S) 8.6-50 MG tablet Take 2 tablets by mouth at bedtime.   Loura Pardon Salicylate (ASPERCREME EX) Apply 1 application topically 2 (two) times daily as needed (Posterior neck muscle spasm).   Marland Kitchen umeclidinium bromide (INCRUSE ELLIPTA) 62.5 MCG/INH AEPB Inhale 1 puff into the  lungs daily. (SHAKE WELL)  . zinc oxide 20 % ointment Apply 1 application topically daily. Apply to local rash at left corner of mouth.   No facility-administered encounter medications on file as of 06/14/2018.     Review of Systems   General she is not complaining of any fever or chills.  Skin does not plan of itching continues to have some dermatitis at the corners of her mouth.  Head ears eyes nose mouth and throat is not currently complaining of visual changes or headache at times will complain of a headache is chronic.  Respiratory does not complain of shortness of breath or cough currently.  Cardiac does not complain of chest pain or increased edema.  GI is not complaining of abdominal pain nausea vomiting diarrhea constipation.  GU does not complain of dysuria.  Musculoskeletal has histories of chronic joint pain and back pain does not specifically complain of that today.  Neurologic  does have a history of tremors appears to be relatively well controlled on primidone.  Psych does have some history of depression with anxiety at this point appears relatively well controlled she is on BuSpar today as well as Lexapro 10 mg a day and Remeron 15 mg nightly  Immunization History  Administered Date(s) Administered  . Influenza, High Dose Seasonal PF 10/04/2016  . Influenza,inj,Quad PF,6+ Mos 01/13/2016  . Influenza-Unspecified 11/10/2017  . Pneumococcal-Unspecified 10/11/2011  . Tdap 01/23/2016   Pertinent  Health Maintenance Due  Topic Date Due  . PNA vac Low Risk Adult (2 of 2 - PCV13) 01/31/2019 (Originally 10/10/2012)  . INFLUENZA VACCINE  08/11/2018  . DEXA SCAN  Discontinued   Fall Risk  01/17/2018 11/09/2017 07/14/2017 04/10/2017 11/08/2016  Falls in the past year? 0 No No Yes Yes  Number falls in past yr: 0 - - 2 or more 2 or more  Injury with Fall? 0 - - Yes Yes  Comment - - - - R hip, L ankle  Risk Factor Category  - - - High Fall Risk -  Follow up Falls evaluation  completed - - Falls evaluation completed -   Functional Status Survey:    Vitals:   06/14/18 1152  BP: 134/76  Pulse: 76  Resp: 20  Temp: 97.7 F (36.5 C)  TempSrc: Oral  SpO2: 98%    Physical Exam   In general this is a pleasant elderly female in no distress sitting comfortably in her wheelchair.  Her skin is warm and dry she continues to have a small type scaly rash at the corners of her mouth prominently on the left side- she remains concerned about this but nursing feels it is improving.  Eyes visual acuity appears intact sclera and conjunctive are clear.  Oropharynx clear mucous membranes moist.  Chest is clear to auscultation with somewhat shallow air entry there is no labored breathing.  Heart is largely regular rate and rhythm with an occasional irregular beat she has trace lower extremity edema.  Abdomen is soft nontender with positive bowel sounds.  Musculoskeletal is able to move all extremities x4 at baseline is able to ambulate pretty well in a wheelchair.  Neurologic is grossly intact I did not really note any overt tremor today.  Psych she is alert and oriented pleasant and appropriate.    Labs reviewed: Recent Labs    12/05/17 03/02/18  NA 141 141  K 5.1 4.5  CL  --  102  CO2  --  29  BUN 16 11  CREATININE 1.1 1.0  CALCIUM  --  8.2   Recent Labs    12/05/17  AST 12*  ALT 8  ALKPHOS 97   Recent Labs    12/05/17 01/24/18 01/29/18  WBC 6.2 8.2 9.0  NEUTROABS 4 7 7   HGB 10.5* 9.7* 10.4*  HCT 31* 29* 30*  PLT 213 294 358   Lab Results  Component Value Date   TSH 4.17 03/02/2018   Lab Results  Component Value Date   HGBA1C 5.2 12/01/2015   Lab Results  Component Value Date   CHOL 107 12/01/2015   HDL 27 (L) 12/01/2015   LDLCALC 59 12/01/2015   TRIG 107 12/01/2015   CHOLHDL 4.0 12/01/2015    Significant Diagnostic Results in last 30 days:  Xr C-arm No Report  Result Date: 05/29/2018 Please see Notes tab for imaging  impression.   Assessment/Plan #1 history of dermatitis-angular cheilitis--at this point will continue zinc oxide per  nursing this is improving but will consider alternatives if patient remains concerned about progress she does not appear to be having significant discomfort at this point.  But will need to monitor  2.  History of bradycardia this appears to be more of a machine variation I do not note pulses below 60 on chart review or per exam today   does not give any history of syncope or palpitations or increased weakness from baseline will monitor. She does have a history of paroxysmal junctional tachycardia and continues on digoxin as well as flecainide digoxin level was 0.9 on lab done in February  I do note a TSH was 4.17 on lab done in February will update this when coronavirus quarantine eases-she is on Synthroid with a history of hypothyroidism.   #3 history of tremors this appears controlled currently on primidone.  [id number]   greater than 25 minutes spent assessing patient- reviewing her chart and labs-discussing her status with nursing staff- discussing her concerns at bedside-coordinating plan of care- of note greater than 50% of time spent coordinating plan of care  With  input as noted above    Granville Lewis PA-C  [phone]

## 2018-06-15 ENCOUNTER — Non-Acute Institutional Stay (SKILLED_NURSING_FACILITY): Payer: Medicare Other | Admitting: Adult Health

## 2018-06-15 ENCOUNTER — Encounter: Payer: Self-pay | Admitting: Adult Health

## 2018-06-15 ENCOUNTER — Encounter: Payer: Self-pay | Admitting: Internal Medicine

## 2018-06-15 DIAGNOSIS — K130 Diseases of lips: Secondary | ICD-10-CM | POA: Diagnosis not present

## 2018-06-15 NOTE — Progress Notes (Signed)
Location:  [facility] Room Number: [place] of Service:  SNF (31) Provider:  Durenda Age, DNP, FNP-BC  Patient Care Team: [name], MD as PCP - General (Internal Medicine) Medina-Vargas, [name], NP as Nurse Practitioner (Internal Medicine)  Extended Emergency Contact Information Primary Emergency Contact: Hatfield,Lisa Address: [address]          [place], [place] [address] [place] Work Phone: [phone] Mobile Phone: [phone] Relation: Daughter  Code Status:  Full Code  Goals of care: Advanced Directive information Advanced Directives 06/14/2018  Does Patient Have a Medical Advance Directive? Yes  Type of Advance Directive Out of facility DNR (pink MOST or yellow form)  Does patient want to make changes to medical advance directive? No - Patient declined  Would patient like information on creating a medical advance directive? -     Chief Complaint  Patient presents with  . Acute Visit    Angular chelitis    HPI:  Pt is a 78 y.o. female seen today for erythematous area on bilateral lips. Current treatment is zinc oxide ointment but verbalized that she does not see any improvement.  She denies having pain.  No reported fever.  No drainage. She was noted to have been touching area several times during the visit despite repetitive reminders. She would laugh when she was reminded several times not to touch area without washing her hands.   Past Medical History:  Diagnosis Date  . COPD (chronic obstructive pulmonary disease) ([place])   . History of anxiety   . History of depression   . Spinal stenosis   . UTI (urinary tract infection)    Past Surgical History:  Procedure Laterality Date  . ANKLE FRACTURE SURGERY Left   . CATARACT EXTRACTION, BILATERAL    . FLEXIBLE SIGMOIDOSCOPY Left 11/18/2015   Procedure: FLEXIBLE SIGMOIDOSCOPY;  Surgeon: [name], MD;  Location: [facility];  Service: Endoscopy;   Laterality: Left;  . FLEXIBLE SIGMOIDOSCOPY N/A 11/20/2015   Procedure: FLEXIBLE SIGMOIDOSCOPY;  Surgeon: [name], MD;  Location: [facility] ENDOSCOPY;  Service: Endoscopy;  Laterality: N/A;  . HIP ARTHROPLASTY Right 01/12/2016   Procedure: ARTHROPLASTY BIPOLAR HIP (HEMIARTHROPLASTY);  Surgeon: [name], MD;  Location: [facility];  Service: Orthopedics;  Laterality: Right;    Allergies  Allergen Reactions  . Biofreeze [Menthol (Topical Analgesic)] Rash    By history Aspercreme does not cause a rash    Outpatient Encounter Medications as of 06/15/2018  Medication Sig  . acetaminophen (TYLENOL) 325 MG tablet Take 2 tablets (650 mg total) by mouth every 8 (eight) hours as needed.  Marland Kitchen aspirin 81 MG chewable tablet Chew 81 mg by mouth 2 (two) times daily.  . busPIRone (BUSPAR) 10 MG tablet Take 10 mg by mouth daily. FOR ANXIETY  . busPIRone (BUSPAR) 5 MG tablet Take 5 mg by mouth 2 (two) times daily.   . digoxin (LANOXIN) 0.125 MG tablet Take 0.125 mg by mouth daily. Hold if HR <60  . docusate sodium (COLACE) 100 MG capsule Take 1 capsule (100 mg total) by mouth 2 (two) times daily as needed for mild constipation.  . DULoxetine (CYMBALTA) 30 MG capsule Take 30 mg by mouth daily. for depression/pain  . escitalopram (LEXAPRO) 20 MG tablet 10 mg. TAKE 1 TABLET BY MOUTH ONCE DAILY FOR DEPRESSION   . flecainide (TAMBOCOR) 50 MG tablet Take 50 mg by mouth every 12 (twelve) hours.  . gabapentin (NEURONTIN) 100 MG capsule Take 200 mg by  mouth 3 (three) times daily.  Marland Kitchen HYDROcodone-acetaminophen (NORCO/VICODIN) 5-325 MG tablet Take 1 tablet by mouth every 12 (twelve) hours as needed for up to 30 doses for moderate pain. T  . levalbuterol (XOPENEX) 1.25 MG/3ML nebulizer solution Take 1.25 mg by nebulization every 6 (six) hours as needed for wheezing.   Marland Kitchen levothyroxine (SYNTHROID, LEVOTHROID) 25 MCG tablet Take 25 mcg by mouth daily before breakfast.  . mirtazapine (REMERON) 15 MG tablet Take 15 mg by mouth at  bedtime. FOR DEPRESSION AND APPETITE  . Multiple Vitamins-Minerals (MULTIVITAMIN WITH MINERALS) tablet Take 1 tablet by mouth daily.  . naphazoline-glycerin (CLEAR EYES REDNESS RELIEF) 0.012-0.2 % SOLN Place 1 drop into both eyes 2 (two) times daily.  Marland Kitchen NUTRITIONAL SUPPLEMENT LIQD Take 120 mLs by mouth 2 (two) times daily. MedPass  . ondansetron (ZOFRAN) 4 MG tablet Take 4 mg by mouth 2 (two) times daily.  . OXYGEN Inhale 2 L into the lungs at bedtime.  . pantoprazole (PROTONIX) 40 MG tablet Take 40 mg by mouth daily.  . polyethylene glycol (MIRALAX / GLYCOLAX) packet Take 17 g by mouth daily.  . primidone (MYSOLINE) 50 MG tablet TAKE 2 TABLETS (100MG ) BY MOUTH EVERY MORNING  . primidone (MYSOLINE) 50 MG tablet TAKE 1 TABLET BY MOUTH AT BEDTIME  . sennosides-docusate sodium (SENOKOT-S) 8.6-50 MG tablet Take 2 tablets by mouth at bedtime.   Loura Pardon Salicylate (ASPERCREME EX) Apply 1 application topically 2 (two) times daily as needed (Posterior neck muscle spasm).   Marland Kitchen umeclidinium bromide (INCRUSE ELLIPTA) 62.5 MCG/INH AEPB Inhale 1 puff into the lungs daily. (SHAKE WELL)  . zinc oxide 20 % ointment Apply 1 application topically daily. Apply to local rash at left corner of mouth.   No facility-administered encounter medications on file as of 06/15/2018.     Review of Systems  GENERAL: No change in appetite, no fatigue, no weight changes, no fever, chills or weakness SKIN:  + Rash  MOUTH and THROAT: Denies oral discomfort, gingival pain or bleeding, pain from teeth or hoarseness   RESPIRATORY: no cough, SOB, DOE, wheezing, hemoptysis CARDIAC: No chest pain, edema or palpitations GI: No abdominal pain, diarrhea, constipation, heart burn, nausea or vomiting GU: Denies dysuria, frequency, hematuria, incontinence, or discharge NEUROLOGICAL: Denies dizziness, syncope, numbness, or headache PSYCHIATRIC: Denies feelings of depression or anxiety. No report of hallucinations, insomnia, paranoia,  or agitation    Immunization History  Administered Date(s) Administered  . Influenza, High Dose Seasonal PF 10/04/2016  . Influenza,inj,Quad PF,6+ Mos 01/13/2016  . Influenza-Unspecified 11/10/2017  . Pneumococcal-Unspecified 10/11/2011  . Tdap 01/23/2016   Pertinent  Health Maintenance Due  Topic Date Due  . PNA vac Low Risk Adult (2 of 2 - PCV13) 01/31/2019 (Originally 10/10/2012)  . INFLUENZA VACCINE  08/11/2018  . DEXA SCAN  Discontinued   Fall Risk  01/17/2018 11/09/2017 07/14/2017 04/10/2017 11/08/2016  Falls in the past year? 0 No No Yes Yes  Number falls in past yr: 0 - - 2 or more 2 or more  Injury with Fall? 0 - - Yes Yes  Comment - - - - R hip, L ankle  Risk Factor Category  - - - High Fall Risk -  Follow up Falls evaluation completed - - Falls evaluation completed -     Vitals:   06/15/18 2306  BP: 130/78  Pulse: 71  Resp: 18  Temp: 97.8 F (36.6 C)  Weight: 142 lb 9.6 oz (64.7 kg)  Height: 5\' 8"  (1.727 m)  Body mass index is 21.68 kg/m.  Physical Exam  GENERAL APPEARANCE: Well nourished. In no acute distress. Normal body habitus  SKIN:  Bilateral corners of lips with erythema MOUTH and THROAT: Lips are without lesions. Oral mucosa is moist and without lesions. Tongue is normal in shape, size, and color and without lesions RESPIRATORY: Breathing is even & unlabored, BS CTAB CARDIAC: RRR, no murmur,no extra heart sounds, no edema GI: Abdomen soft, normal BS, no masses, no tenderness EXTREMITIES:  Able to move X 4 extremities NEUROLOGICAL: There is no tremor. Speech is clear. Alert and oriented X 3.  PSYCHIATRIC: Affect and behavior are appropriate  Labs reviewed: Recent Labs    12/05/17 03/02/18  NA 141 141  K 5.1 4.5  CL  --  102  CO2  --  29  BUN 16 11  CREATININE 1.1 1.0  CALCIUM  --  8.2   Recent Labs    12/05/17  AST 12*  ALT 8  ALKPHOS 97   Recent Labs    12/05/17 01/24/18 01/29/18  WBC 6.2 8.2 9.0  NEUTROABS 4 7 7   HGB 10.5* 9.7*  10.4*  HCT 31* 29* 30*  PLT 213 294 358   Lab Results  Component Value Date   TSH 4.17 03/02/2018   Lab Results  Component Value Date   HGBA1C 5.2 12/01/2015   Lab Results  Component Value Date   CHOL 107 12/01/2015   HDL 27 (L) 12/01/2015   LDLCALC 59 12/01/2015   TRIG 107 12/01/2015   CHOLHDL 4.0 12/01/2015    Significant Diagnostic Results in last 30 days:  Xr C-arm No Report  Result Date: 05/29/2018 Please see Notes tab for imaging impression.   Assessment/Plan  1. Angular cheilitis -Had no improvement according to resident with zinc oxide, will start triamcinolone acetonide 0.1% cream apply topically to bilateral corners of the lips twice daily x2 weeks, keep area clean and not to touch area without washing hands    Family/ staff Communication: Discussed plan of care with resident.  Labs/tests ordered: None  Goals of care:   Long-term care   Durenda Age, DNP, FNP-BC [facility] [phone] (Monday-Friday 8:00 a.m. - 5:00 p.m.) [phone] (after hours)

## 2018-06-19 ENCOUNTER — Other Ambulatory Visit: Payer: Self-pay | Admitting: Adult Health

## 2018-06-19 MED ORDER — HYDROCODONE-ACETAMINOPHEN 5-325 MG PO TABS: 1.0000 | ORAL_TABLET | Freq: Two times a day (BID) | ORAL | 0 refills | Status: DC | PRN

## 2018-06-22 ENCOUNTER — Non-Acute Institutional Stay (SKILLED_NURSING_FACILITY): Payer: Medicare Other | Admitting: Internal Medicine

## 2018-06-22 DIAGNOSIS — J309 Allergic rhinitis, unspecified: Secondary | ICD-10-CM | POA: Diagnosis not present

## 2018-06-22 NOTE — Progress Notes (Signed)
This is an acute visit.  Level care skilled.  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  Chief complaint- acute visit follow-up allergic rhinitis.  History of present illness.  Patient is a 78 year old female seen today for complaints of allergic rhinitis.  She just was started on Zyrtec 2 days ago she complains mainly of a runny nose.  Does not really complain of any increased shortness of breath from baseline or cough.  Currently she is resting in bed comfortably she has been afebrile.  O2 sats appear to be in the 90s on room air.  Her other diagnoses include COPD- in addition to anemia chronic pain syndrome and depression  In regards to COPD she does occasionally use her oxygen at night she has orders for Incruse Ellipta as well.  Past Medical History:  Diagnosis Date  . COPD (chronic obstructive pulmonary disease) ([place])   . History of anxiety   . History of depression   . Spinal stenosis   . UTI (urinary tract infection)         Past Surgical History:  Procedure Laterality Date  . ANKLE FRACTURE SURGERY Left   . CATARACT EXTRACTION, BILATERAL    . FLEXIBLE SIGMOIDOSCOPY Left 11/18/2015   Procedure: FLEXIBLE SIGMOIDOSCOPY;  Surgeon: [name], MD;  Location: [facility];  Service: Endoscopy;  Laterality: Left;  . FLEXIBLE SIGMOIDOSCOPY N/A 11/20/2015   Procedure: FLEXIBLE SIGMOIDOSCOPY;  Surgeon: [name], MD;  Location: [facility] ENDOSCOPY;  Service: Endoscopy;  Laterality: N/A;  . HIP ARTHROPLASTY Right 01/12/2016   Procedure: ARTHROPLASTY BIPOLAR HIP (HEMIARTHROPLASTY);  Surgeon: [name], MD;  Location: [facility];  Service: Orthopedics;  Laterality: Right;         Allergies  Allergen Reactions  . Biofreeze [Menthol (Topical Analgesic)] Rash    By history Aspercreme does not cause a rash      MEDICATIONS     Medication Sig  . acetaminophen (TYLENOL) 325 MG tablet Take 2 tablets (650 mg total) by mouth every 8 (eight) hours as needed.  Marland Kitchen aspirin 81 MG chewable  tablet Chew 81 mg by mouth 2 (two) times daily.  . busPIRone (BUSPAR) 10 MG tablet Take 10 mg by mouth daily. FOR ANXIETY  . busPIRone (BUSPAR) 5 MG tablet Take 5 mg by mouth 2 (two) times daily.   . digoxin (LANOXIN) 0.125 MG tablet Take 0.125 mg by mouth daily. Hold if HR <60  . docusate sodium (COLACE) 100 MG capsule Take 1 capsule (100 mg total) by mouth 2 (two) times daily as needed for mild constipation.  . DULoxetine (CYMBALTA) 30 MG capsule Take 30 mg by mouth daily. for depression/pain  . escitalopram (LEXAPRO) 20 MG tablet 10 mg. TAKE 1 TABLET BY MOUTH ONCE DAILY FOR DEPRESSION   . flecainide (TAMBOCOR) 50 MG tablet Take 50 mg by mouth every 12 (twelve) hours.  . gabapentin (NEURONTIN) 100 MG capsule Take 200 mg by mouth 3 (three) times daily.  Marland Kitchen HYDROcodone-acetaminophen (NORCO/VICODIN) 5-325 MG tablet Take 1 tablet by mouth every 12 (twelve) hours as needed for up to 30 doses for moderate pain. T  . levalbuterol (XOPENEX) 1.25 MG/3ML nebulizer solution Take 1.25 mg by nebulization every 6 (six) hours as needed for wheezing.   Marland Kitchen levothyroxine (SYNTHROID, LEVOTHROID) 25 MCG tablet Take 25 mcg by mouth daily before breakfast.  . mirtazapine (REMERON) 15 MG tablet Take 15 mg by mouth at bedtime. FOR DEPRESSION AND APPETITE  . Multiple Vitamins-Minerals (MULTIVITAMIN WITH MINERALS) tablet Take 1 tablet by mouth daily.  . naphazoline-glycerin (  CLEAR EYES REDNESS RELIEF) 0.012-0.2 % SOLN Place 1 drop into both eyes 2 (two) times daily.  Marland Kitchen NUTRITIONAL SUPPLEMENT LIQD Take 120 mLs by mouth 2 (two) times daily. MedPass  . ondansetron (ZOFRAN) 4 MG tablet Take 4 mg by mouth 2 (two) times daily.  . OXYGEN Inhale 2 L into the lungs at bedtime.  . pantoprazole (PROTONIX) 40 MG tablet Take 40 mg by mouth daily.  . polyethylene glycol (MIRALAX / GLYCOLAX) packet Take 17 g by mouth daily.  . primidone (MYSOLINE) 50 MG tablet TAKE 2 TABLETS (100MG ) BY MOUTH EVERY MORNING  . primidone (MYSOLINE) 50 MG  tablet TAKE 1 TABLET BY MOUTH AT BEDTIME  . sennosides-docusate sodium (SENOKOT-S) 8.6-50 MG tablet Take 2 tablets by mouth at bedtime.   Loura Pardon Salicylate (ASPERCREME EX) Apply 1 application topically 2 (two) times daily as needed (Posterior neck muscle spasm).   Marland Kitchen umeclidinium bromide (INCRUSE ELLIPTA) 62.5 MCG/INH AEPB Inhale 1 puff into the lungs daily. (SHAKE WELL)  . zinc oxide 20 % ointment Apply 1 application topically daily. Apply to local rash at left corner of mouth.   Review of systems.  General she is not complaining of any fever or chills.  Skin not complaining of rashes or itching she is getting a topical cream for suspected Angular cheilitis  Head ears eyes nose mouth and throat is not complaining of any sore throat at this time she is complaining of some nasal drainage she attributes to allergies does not really complain of eye drainage.  Respiratory history of COPD but is she is not really complaining of any shortness of breath or cough beyond baseline.  Cardiac does not complain of chest pain.  GI is not complaining of abdominal discomfort nausea vomiting diarrhea constipation.  Musculoskeletal has somewhat chronic complaints of pain which appear to be relatively stabilized does not specifically complain of that today.  Neurologic does complain of headaches at times which have some chronicity to it.  Is not really complaining of numbness does complain of tremors as well which are relatively chronic.  Psych does have a history of some anxiety and depression this appears relatively at baseline continues to be pleasant and appropriate.  Physical exam.  She is afebrile pulse of 80 respirations of 18  In general this is a pleasant elderly female lying comfortably in bed.  Her skin is warm and dry.  Eyes visual acuity appears intact sclera and conjunctive are clear.  Oropharynx is clear mucous membranes moist.  Nose could not really appreciate any active  drainage at this point  Chest is clear to auscultation with shallow air entry there is no labored breathing.  Heart continues to be largely regular rate and rhythm with occasionally some irregular beats continues with trace lower extremity edema.  Abdomen is soft nontender with positive bowel sounds.  Musculoskeletal Limited exam since she is in bed but appears able to move all her extremities at baseline.  Neurologic appears grossly intact when I asked her to raise her arm she does have a slight tremor of the right arm.  Psych she is alert and oriented pleasant and appropriate.  Labs.  March 02, 2018.  Sodium 141 potassium 4.5 BUN 11 creatinine 0.95.  TSH 4.14.  Digoxin level 0.9.  January 29, 2018.  WBC 9.0 hemoglobin 10.4 platelets 358.  Assessment and plan.  1.  Runny nose suspect allergic rhinitis-she has just been started on Zyrtec- at this point would like to give this a little more  time to see if it does help-she is in agreement with this--- if not effective consider a short course of Flonase.  Clinically she appears to be stable and at baseline does not really complain of any increased shortness of breath or cough beyond baseline.  [id number]

## 2018-06-26 ENCOUNTER — Encounter: Payer: Self-pay | Admitting: Neurology

## 2018-06-28 DIAGNOSIS — F419 Anxiety disorder, unspecified: Secondary | ICD-10-CM | POA: Diagnosis not present

## 2018-06-28 DIAGNOSIS — G4700 Insomnia, unspecified: Secondary | ICD-10-CM | POA: Diagnosis not present

## 2018-06-28 DIAGNOSIS — R630 Anorexia: Secondary | ICD-10-CM | POA: Diagnosis not present

## 2018-06-28 DIAGNOSIS — F331 Major depressive disorder, recurrent, moderate: Secondary | ICD-10-CM | POA: Diagnosis not present

## 2018-06-29 ENCOUNTER — Non-Acute Institutional Stay (SKILLED_NURSING_FACILITY): Payer: Medicare Other | Admitting: Adult Health

## 2018-06-29 ENCOUNTER — Encounter: Payer: Self-pay | Admitting: Adult Health

## 2018-06-29 DIAGNOSIS — J302 Other seasonal allergic rhinitis: Secondary | ICD-10-CM | POA: Diagnosis not present

## 2018-06-29 DIAGNOSIS — J410 Simple chronic bronchitis: Secondary | ICD-10-CM

## 2018-06-29 NOTE — Progress Notes (Signed)
Location:  [facility] Room Number: 353-I Place of Service:  SNF (31) Provider:  Durenda Age, DNP, FNP-BC  Patient Care Team: [name], MD as PCP - General (Internal Medicine) Medina-Vargas, [name], NP as Nurse Practitioner (Internal Medicine)  Extended Emergency Contact Information Primary Emergency Contact: Hatfield,Lisa Address: [address]          [place], [place] [address] [place] Work Phone: [phone] Mobile Phone: [phone] Relation: Daughter  Code Status:  Full Code  Goals of care: Advanced Directive information Advanced Directives 06/14/2018  Does Patient Have a Medical Advance Directive? Yes  Type of Advance Directive Out of facility DNR (pink MOST or yellow form)  Does patient want to make changes to medical advance directive? No - Patient declined  Would patient like information on creating a medical advance directive? -     Chief Complaint  Patient presents with  . Acute Visit    Patient with complaints of nasal drip.    HPI:  Pt is a 78 y.o. female seen today for an acute visit due to complaints of having a nasal drip. She is a long-term care resident of [facility]. She has a PMH of COPD, anemia, hyperthyroidism, chronic pain syndrome, and depression. She was started on Cetirizine on 06/21/18 for nasal drip. She complains of nasal drip today despite being on Cetirizine. Nasl discharge is clear. She denies having fever nor sore throat.   Past Medical History:  Diagnosis Date  . COPD (chronic obstructive pulmonary disease) ([place])   . History of anxiety   . History of depression   . Spinal stenosis   . UTI (urinary tract infection)    Past Surgical History:  Procedure Laterality Date  . ANKLE FRACTURE SURGERY Left   . CATARACT EXTRACTION, BILATERAL    . FLEXIBLE SIGMOIDOSCOPY Left 11/18/2015   Procedure: FLEXIBLE SIGMOIDOSCOPY;  Surgeon: [name], MD;  Location: [place];  Service: Endoscopy;  Laterality: Left;  . FLEXIBLE SIGMOIDOSCOPY N/A 11/20/2015   Procedure: FLEXIBLE SIGMOIDOSCOPY;  Surgeon: [name], MD;  Location: [facility] ENDOSCOPY;  Service: Endoscopy;  Laterality: N/A;  . HIP ARTHROPLASTY Right 01/12/2016   Procedure: ARTHROPLASTY BIPOLAR HIP (HEMIARTHROPLASTY);  Surgeon: [name], MD;  Location: [facility];  Service: Orthopedics;  Laterality: Right;    Allergies  Allergen Reactions  . Biofreeze [Menthol (Topical Analgesic)] Rash    By history Aspercreme does not cause a rash    Outpatient Encounter Medications as of 06/29/2018  Medication Sig  . acetaminophen (TYLENOL) 325 MG tablet Take 2 tablets (650 mg total) by mouth every 8 (eight) hours as needed.  Marland Kitchen aspirin 81 MG chewable tablet Chew 81 mg by mouth 2 (two) times daily.  . cetirizine (ZYRTEC) 10 MG tablet Take 10 mg by mouth at bedtime.  . digoxin (LANOXIN) 0.125 MG tablet Take 0.125 mg by mouth daily. Hold if HR <60  . docusate sodium (COLACE) 100 MG capsule Take 1 capsule (100 mg total) by mouth 2 (two) times daily as needed for mild constipation.  . DULoxetine (CYMBALTA) 30 MG capsule Take 30 mg by mouth daily. for depression/pain  . escitalopram (LEXAPRO) 20 MG tablet 10 mg. 0.5 mg qd x7 days and then discontinue  . flecainide (TAMBOCOR) 50 MG tablet Take 50 mg by mouth every 12 (twelve) hours.  . fluticasone (FLONASE ALLERGY RELIEF) 50 MCG/ACT nasal spray Place 1 spray into both nostrils daily.  Marland Kitchen gabapentin (NEURONTIN) 100 MG capsule Take 200  mg by mouth 3 (three) times daily.  Marland Kitchen HYDROcodone-acetaminophen (NORCO/VICODIN) 5-325 MG tablet Take 1 tablet by mouth every 12 (twelve) hours as needed for up to 30 doses for moderate pain. T  . levalbuterol (XOPENEX) 1.25 MG/3ML nebulizer solution Take 1.25 mg by nebulization every 6 (six) hours as needed for wheezing.   Marland Kitchen levothyroxine (SYNTHROID, LEVOTHROID) 25 MCG tablet Take 25 mcg by mouth daily before breakfast.  . mirtazapine  (REMERON) 15 MG tablet Take 15 mg by mouth at bedtime.   . Multiple Vitamins-Minerals (MULTIVITAMIN WITH MINERALS) tablet Take 1 tablet by mouth daily.  . naphazoline-glycerin (CLEAR EYES REDNESS RELIEF) 0.012-0.2 % SOLN Place 1 drop into both eyes 2 (two) times daily.  . ondansetron (ZOFRAN) 4 MG tablet Take 4 mg by mouth 2 (two) times daily.  . OXYGEN Inhale 2 L into the lungs at bedtime.  . pantoprazole (PROTONIX) 40 MG tablet Take 40 mg by mouth daily.  . polyethylene glycol (MIRALAX / GLYCOLAX) packet Take 17 g by mouth daily.  . primidone (MYSOLINE) 50 MG tablet Take 100 mg by mouth every morning. Take 2 tablets to = 100 mg  . primidone (MYSOLINE) 50 MG tablet Take 50 mg by mouth at bedtime.   . sennosides-docusate sodium (SENOKOT-S) 8.6-50 MG tablet Take 2 tablets by mouth at bedtime.   . TRAZODONE HCL PO Take 12.5 mg by mouth 3 (three) times daily.  . Triamcinolone Acetonide (TRIAMCINOLONE 0.1 % CREAM : EUCERIN) CREA Apply 1 application topically 2 (two) times daily. Apply to bilateral corners of mouth  . Trolamine Salicylate (ASPERCREME EX) Apply 1 application topically 2 (two) times daily as needed (Posterior neck muscle spasm).   Marland Kitchen umeclidinium bromide (INCRUSE ELLIPTA) 62.5 MCG/INH AEPB Inhale 1 puff into the lungs daily. (SHAKE WELL)  . [DISCONTINUED] busPIRone (BUSPAR) 10 MG tablet Take 10 mg by mouth daily. FOR ANXIETY  . [DISCONTINUED] busPIRone (BUSPAR) 5 MG tablet Take 5 mg by mouth 2 (two) times daily.   . [DISCONTINUED] NUTRITIONAL SUPPLEMENT LIQD Take 120 mLs by mouth 2 (two) times daily. MedPass  . [DISCONTINUED] zinc oxide 20 % ointment Apply 1 application topically daily. Apply to local rash at left corner of mouth.   No facility-administered encounter medications on file as of 06/29/2018.     Review of Systems  GENERAL: No change in appetite, no fatigue, no weight changes, no fever, chills or weakness MOUTH and THROAT: Denies oral discomfort, gingival pain or bleeding  RESPIRATORY: no cough, SOB, DOE, wheezing, hemoptysis CARDIAC: No chest pain, edema or palpitations GI: No abdominal pain, diarrhea, constipation, heart burn, nausea or vomiting GU: Denies dysuria, frequency, hematuria, incontinence, or discharge NEUROLOGICAL: Denies dizziness, syncope, numbness, or headache PSYCHIATRIC: Denies feelings of depression or anxiety. No report of hallucinations, insomnia, paranoia, or agitation    Immunization History  Administered Date(s) Administered  . Influenza, High Dose Seasonal PF 10/04/2016  . Influenza,inj,Quad PF,6+ Mos 01/13/2016  . Influenza-Unspecified 11/10/2017  . Pneumococcal-Unspecified 10/11/2011  . Tdap 01/23/2016   Pertinent  Health Maintenance Due  Topic Date Due  . PNA vac Low Risk Adult (2 of 2 - PCV13) 01/31/2019 (Originally 10/10/2012)  . INFLUENZA VACCINE  08/11/2018  . DEXA SCAN  Discontinued   Fall Risk  01/17/2018 11/09/2017 07/14/2017 04/10/2017 11/08/2016  Falls in the past year? 0 No No Yes Yes  Number falls in past yr: 0 - - 2 or more 2 or more  Injury with Fall? 0 - - Yes Yes  Comment - - - -  R hip, L ankle  Risk Factor Category  - - - High Fall Risk -  Follow up Falls evaluation completed - - Falls evaluation completed -     Vitals:   06/29/18 2148  BP: 128/64  Pulse: 84  Resp: 18  Temp: 98.1 F (36.7 C)  TempSrc: Oral  SpO2: 95%  Weight: 142 lb 9.6 oz (64.7 kg)  Height: 5\' 8"  (1.727 m)   Body mass index is 21.68 kg/m.  Physical Exam  GENERAL APPEARANCE: Well nourished. In no acute distress. Normal body habitus SKIN:  Skin is warm and dry.  MOUTH and THROAT: Lips are without lesions. Oral mucosa is moist and without lesions. Tongue is normal in shape, size, and color and without lesions RESPIRATORY: Breathing is even & unlabored, BS CTAB CARDIAC: RRR, no murmur,no extra heart sounds, no edema GI: Abdomen soft, normal BS, no masses, no tenderness EXTREMITIES:  Able to move X 4 extremities NEUROLOGICAL:  Speech is clear. Alert and oriented X 3. PSYCHIATRIC:  Affect and behavior are appropriate  Labs reviewed: Recent Labs    12/05/17 03/02/18  NA 141 141  K 5.1 4.5  CL  --  102  CO2  --  29  BUN 16 11  CREATININE 1.1 1.0  CALCIUM  --  8.2   Recent Labs    12/05/17  AST 12*  ALT 8  ALKPHOS 97   Recent Labs    12/05/17 01/24/18 01/29/18  WBC 6.2 8.2 9.0  NEUTROABS 4 7 7   HGB 10.5* 9.7* 10.4*  HCT 31* 29* 30*  PLT 213 294 358   Lab Results  Component Value Date   TSH 4.17 03/02/2018   Lab Results  Component Value Date   HGBA1C 5.2 12/01/2015   Lab Results  Component Value Date   CHOL 107 12/01/2015   HDL 27 (L) 12/01/2015   LDLCALC 59 12/01/2015   TRIG 107 12/01/2015   CHOLHDL 4.0 12/01/2015     Assessment/Plan  1. Seasonal allergies - will start Flonase 50 mcg/ACT 1 spray to each nostril daily and continue cetirizine 10 mg 1 tab daily  2. Simple chronic bronchitis (HCC) -No SOB nor wheezing, continue levalbuterol nebulization as needed,  [place] Ellipta 62.5 mcg INH 1 puff daily    Family/ staff Communication: Discussed plan of care with resident  Labs/tests ordered:  None  Goals of care:   Long-term care.   Durenda Age, DNP, FNP-BC [facility] [phone] (Monday-Friday 8:00 a.m. - 5:00 p.m.) [phone] (after hours)

## 2018-06-30 DIAGNOSIS — J302 Other seasonal allergic rhinitis: Secondary | ICD-10-CM | POA: Insufficient documentation

## 2018-07-02 ENCOUNTER — Encounter: Payer: Self-pay | Admitting: Internal Medicine

## 2018-07-02 NOTE — Progress Notes (Signed)
Location:    [place] Room Number: 202/A Place of Service:  SNF [phone]) Provider: Granville Lewis PA-C  [name], MD  Patient Care Team: [name], MD as PCP - General (Internal Medicine) Medina-Vargas, [name], NP as Nurse Practitioner (Internal Medicine)  Extended Emergency Contact Information Primary Emergency Contact: Hatfield,Lisa Address: [address]          [place],  [address] [place] Work Phone: [phone] Mobile Phone: [phone] Relation: Daughter  Code Status:  Full Code Goals of care: Advanced Directive information Advanced Directives 07/02/2018  Does Patient Have a Medical Advance Directive? Yes  Type of Advance Directive (No Data)  Does patient want to make changes to medical advance directive? No - Patient declined  Would patient like information on creating a medical advance directive? -     Chief Complaint  Patient presents with  . Medical Management of Chronic Issues    Routine visit of medical management    HPI:  Pt is a 78 y.o. female seen today for medical management of chronic diseases.     Past Medical History:  Diagnosis Date  . COPD (chronic obstructive pulmonary disease) ([place])   . History of anxiety   . History of depression   . Spinal stenosis   . UTI (urinary tract infection)    Past Surgical History:  Procedure Laterality Date  . ANKLE FRACTURE SURGERY Left   . CATARACT EXTRACTION, BILATERAL    . FLEXIBLE SIGMOIDOSCOPY Left 11/18/2015   Procedure: FLEXIBLE SIGMOIDOSCOPY;  Surgeon: [name], MD;  Location: [place];  Service: Endoscopy;  Laterality: Left;  . FLEXIBLE SIGMOIDOSCOPY N/A 11/20/2015   Procedure: FLEXIBLE SIGMOIDOSCOPY;  Surgeon: [name], MD;  Location: [facility] ENDOSCOPY;  Service: Endoscopy;  Laterality: N/A;  . HIP ARTHROPLASTY Right 01/12/2016   Procedure: ARTHROPLASTY BIPOLAR HIP (HEMIARTHROPLASTY);  Surgeon: [name], MD;  Location:  [facility];  Service: Orthopedics;  Laterality: Right;    Allergies  Allergen Reactions  . Biofreeze [Menthol (Topical Analgesic)] Rash    By history Aspercreme does not cause a rash    Allergies as of 07/02/2018      Reactions   Biofreeze [menthol (topical Analgesic)] Rash   By history Aspercreme does not cause a rash      Medication List       Accurate as of July 02, 2018  9:36 AM. If you have any questions, ask your nurse or doctor.        STOP taking these medications   triamcinolone 0.1 % cream : eucerin Crea Stopped by: Granville Lewis, PA-C     TAKE these medications   acetaminophen 325 MG tablet Commonly known as: TYLENOL Take 2 tablets (650 mg total) by mouth every 8 (eight) hours as needed.   ASPERCREME EX Apply 1 application topically 2 (two) times daily as needed (Posterior neck muscle spasm).   aspirin 81 MG chewable tablet Chew 81 mg by mouth 2 (two) times daily.   cetirizine 10 MG tablet Commonly known as: ZYRTEC Take 10 mg by mouth at bedtime.   Clear Eyes Redness Relief 0.012-0.2 % Soln Generic drug: naphazoline-glycerin Place 1 drop into both eyes 2 (two) times daily.   digoxin 0.125 MG tablet Commonly known as: LANOXIN Take 0.125 mg by mouth daily. Hold if HR <60   docusate sodium 100 MG capsule Commonly known as: COLACE Take 1 capsule (100 mg total) by mouth 2 (two) times daily  as needed for mild constipation.   DULoxetine 30 MG capsule Commonly known as: CYMBALTA Take 30 mg by mouth daily. for depression/pain   escitalopram 20 MG tablet Commonly known as: LEXAPRO 10 mg. 0.5 mg qd x7 days and then discontinue   flecainide 50 MG tablet Commonly known as: TAMBOCOR Take 50 mg by mouth every 12 (twelve) hours.   Flonase Allergy Relief 50 MCG/ACT nasal spray Generic drug: fluticasone Place 1 spray into both nostrils daily.   gabapentin 100 MG capsule Commonly known as: NEURONTIN Take 200 mg by mouth 3 (three) times daily.    HYDROcodone-acetaminophen 5-325 MG tablet Commonly known as: NORCO/VICODIN Take 1 tablet by mouth every 12 (twelve) hours as needed for up to 30 doses for moderate pain. T   Incruse Ellipta 62.5 MCG/INH Aepb Generic drug: umeclidinium bromide Inhale 1 puff into the lungs daily. (SHAKE WELL)   levalbuterol 1.25 MG/3ML nebulizer solution Commonly known as: XOPENEX Take 1.25 mg by nebulization every 6 (six) hours as needed for wheezing.   levothyroxine 25 MCG tablet Commonly known as: SYNTHROID Take 25 mcg by mouth daily before breakfast.   mirtazapine 15 MG tablet Commonly known as: REMERON Take 15 mg by mouth at bedtime.   multivitamin with minerals tablet Take 1 tablet by mouth daily.   ondansetron 4 MG tablet Commonly known as: ZOFRAN Take 4 mg by mouth 2 (two) times daily.   OXYGEN Inhale 2 L into the lungs at bedtime.   pantoprazole 40 MG tablet Commonly known as: PROTONIX Take 40 mg by mouth daily.   polyethylene glycol 17 g packet Commonly known as: MIRALAX / GLYCOLAX Take 17 g by mouth daily.   primidone 50 MG tablet Commonly known as: MYSOLINE Take 100 mg by mouth every morning.   primidone 50 MG tablet Commonly known as: MYSOLINE Take 50 mg by mouth at bedtime.   sennosides-docusate sodium 8.6-50 MG tablet Commonly known as: SENOKOT-S Take 2 tablets by mouth at bedtime.   TRAZODONE HCL PO Take 12.5 mg by mouth 3 (three) times daily.       Review of Systems  Immunization History  Administered Date(s) Administered  . Influenza, High Dose Seasonal PF 10/04/2016  . Influenza,inj,Quad PF,6+ Mos 01/13/2016  . Influenza-Unspecified 11/10/2017  . Pneumococcal-Unspecified 10/11/2011  . Tdap 01/23/2016   Pertinent  Health Maintenance Due  Topic Date Due  . PNA vac Low Risk Adult (2 of 2 - PCV13) 01/31/2019 (Originally 10/10/2012)  . INFLUENZA VACCINE  08/11/2018  . DEXA SCAN  Discontinued   Fall Risk  01/17/2018 11/09/2017 07/14/2017 04/10/2017  11/08/2016  Falls in the past year? 0 No No Yes Yes  Number falls in past yr: 0 - - 2 or more 2 or more  Injury with Fall? 0 - - Yes Yes  Comment - - - - R hip, L ankle  Risk Factor Category  - - - High Fall Risk -  Follow up Falls evaluation completed - - Falls evaluation completed -   Functional Status Survey:    Vitals:   07/02/18 0918  BP: 119/73  Pulse: 81  Resp: 20  Temp: 98.1 F (36.7 C)  TempSrc: Oral  SpO2: 94%  Weight: 142 lb 9.6 oz (64.7 kg)  Height: 5\' 8"  (1.727 m)   Body mass index is 21.68 kg/m. Physical Exam  Labs reviewed: Recent Labs    12/05/17 03/02/18  NA 141 141  K 5.1 4.5  CL  --  102  CO2  --  29  BUN 16 11  CREATININE 1.1 1.0  CALCIUM  --  8.2   Recent Labs    12/05/17  AST 12*  ALT 8  ALKPHOS 97   Recent Labs    12/05/17 01/24/18 01/29/18  WBC 6.2 8.2 9.0  NEUTROABS 4 7 7   HGB 10.5* 9.7* 10.4*  HCT 31* 29* 30*  PLT 213 294 358   Lab Results  Component Value Date   TSH 4.17 03/02/2018   Lab Results  Component Value Date   HGBA1C 5.2 12/01/2015   Lab Results  Component Value Date   CHOL 107 12/01/2015   HDL 27 (L) 12/01/2015   LDLCALC 59 12/01/2015   TRIG 107 12/01/2015   CHOLHDL 4.0 12/01/2015    Significant Diagnostic Results in last 30 days:  No results found.  Assessment/Plan There are no diagnoses linked to this encounter.   Family/ staff Communication:   Labs/tests ordered:

## 2018-07-04 ENCOUNTER — Encounter: Payer: Self-pay | Admitting: Internal Medicine

## 2018-07-04 ENCOUNTER — Non-Acute Institutional Stay (SKILLED_NURSING_FACILITY): Payer: Medicare Other | Admitting: Internal Medicine

## 2018-07-04 DIAGNOSIS — G894 Chronic pain syndrome: Secondary | ICD-10-CM

## 2018-07-04 DIAGNOSIS — R251 Tremor, unspecified: Secondary | ICD-10-CM

## 2018-07-04 DIAGNOSIS — J410 Simple chronic bronchitis: Secondary | ICD-10-CM

## 2018-07-04 DIAGNOSIS — J302 Other seasonal allergic rhinitis: Secondary | ICD-10-CM | POA: Diagnosis not present

## 2018-07-04 DIAGNOSIS — R000 Tachycardia, unspecified: Secondary | ICD-10-CM

## 2018-07-04 NOTE — Progress Notes (Signed)
Location:    [facility] Room Number: 202/A Place of Service:  SNF [phone]) Provider:  Granville Lewis PA-C  [name], MD  Patient Care Team: [name], MD as PCP - General (Internal Medicine) Medina-Vargas, [name], NP as Nurse Practitioner (Internal Medicine)  Extended Emergency Contact Information Primary Emergency Contact: Hatfield,Lisa Address: [address]          [place], [place] [address] [place] Work Phone: [phone] Mobile Phone: [phone] Relation: Daughter  Code Status:  Full Code Goals of care: Advanced Directive information Advanced Directives 07/04/2018  Does Patient Have a Medical Advance Directive? Yes  Type of Advance Directive (No Data)  Does patient want to make changes to medical advance directive? No - Patient declined  Would patient like information on creating a medical advance directive? -     Complaint-routine visit for medical management of conditions including COPD-tremors- chronic pain- anxiety- history of tachycardia- as well as depression  HPI:  Pt is a 78 y.o. female seen today for medical management of chronic diseases.  As noted above. 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  She also does have a history of chronic pain and has received a nerve block in the past by Dr. Ernestina Patches.  Continues on Norco as a day as needed as well as Cymbalta 30 mg a day and Neurontin 200 mg 3 times daily.  She does have some history of depression with anxiety she was actually seen by the psychiatric nurse practitioner last week occasions were changed she is being titrated down of her Lexapro and has been started on trazodone 3 times a day.  She is also being treated with Zyrtec and Flonase for suspected allergic rhinitis she feels this  is somewhat better.  Regards atrial tachycardia she continues on digoxin as well as Flecanide  --appears to be rate controlled.  Currently she is resting in bed comfortably her main complaint appears to be more her tremors and at times some pain.  Lower list of blood pressure but she appears to be asymptomatic suspect there may be some machine variation here.  I see 1 listed in the 80s earlier this week but this appears to be an anomaly appears him systolics are mainly in the lower 100s  In regards to COPD-she continues on nebulizers  as needed and Incruse Ellipta and oxygen as needed   Past Medical History:  Diagnosis Date  . COPD (chronic obstructive pulmonary disease) ([place])   . History of anxiety   . History of depression   . Spinal stenosis   . UTI (urinary tract infection)    Past Surgical History:  Procedure Laterality Date  . ANKLE FRACTURE SURGERY Left   . CATARACT EXTRACTION, BILATERAL    . FLEXIBLE SIGMOIDOSCOPY Left 11/18/2015   Procedure: FLEXIBLE SIGMOIDOSCOPY;  Surgeon: [name], MD;  Location: [facility];  Service: Endoscopy;  Laterality: Left;  . FLEXIBLE SIGMOIDOSCOPY N/A 11/20/2015   Procedure: FLEXIBLE SIGMOIDOSCOPY;  Surgeon: [name], MD;  Location: [facility] ENDOSCOPY;  Service: Endoscopy;  Laterality: N/A;  . HIP ARTHROPLASTY Right 01/12/2016   Procedure: ARTHROPLASTY BIPOLAR HIP (HEMIARTHROPLASTY);  Surgeon: [name], MD;  Location: [facility];  Service: Orthopedics;  Laterality: Right;    Allergies  Allergen Reactions  . Biofreeze [  Menthol (Topical Analgesic)] Rash    By history Aspercreme does not cause a rash    Allergies as of 07/04/2018      Reactions   Biofreeze [menthol (topical Analgesic)] Rash   By history Aspercreme does not cause a rash      Medication List       Accurate as of July 04, 2018 11:34 AM. If you have any questions, ask your nurse or doctor.        acetaminophen 325 MG tablet Commonly known as: TYLENOL Take 2 tablets (650  mg total) by mouth every 8 (eight) hours as needed.   ASPERCREME EX Apply 1 application topically 2 (two) times daily as needed (Posterior neck muscle spasm).   aspirin 81 MG chewable tablet Chew 81 mg by mouth 2 (two) times daily.   cetirizine 10 MG tablet Commonly known as: ZYRTEC Take 10 mg by mouth at bedtime.   Clear Eyes Redness Relief 0.012-0.2 % Soln Generic drug: naphazoline-glycerin Place 1 drop into both eyes 2 (two) times daily.   digoxin 0.125 MG tablet Commonly known as: LANOXIN Take 0.125 mg by mouth daily. Hold if HR <60   docusate sodium 100 MG capsule Commonly known as: COLACE Take 1 capsule (100 mg total) by mouth 2 (two) times daily as needed for mild constipation.   DULoxetine 30 MG capsule Commonly known as: CYMBALTA Take 30 mg by mouth daily. for depression/pain   escitalopram 20 MG tablet Commonly known as: LEXAPRO 10 mg. 0.5 mg qd x7 days and then discontinue   flecainide 50 MG tablet Commonly known as: TAMBOCOR Take 50 mg by mouth every 12 (twelve) hours.   Flonase Allergy Relief 50 MCG/ACT nasal spray Generic drug: fluticasone Place 1 spray into both nostrils daily.   gabapentin 100 MG capsule Commonly known as: NEURONTIN Take 200 mg by mouth 3 (three) times daily.   HYDROcodone-acetaminophen 5-325 MG tablet Commonly known as: NORCO/VICODIN Take 1 tablet by mouth every 12 (twelve) hours as needed for up to 30 doses for moderate pain. T   Incruse Ellipta 62.5 MCG/INH Aepb Generic drug: umeclidinium bromide Inhale 1 puff into the lungs daily. (SHAKE WELL)   levalbuterol 1.25 MG/3ML nebulizer solution Commonly known as: XOPENEX Take 1.25 mg by nebulization every 6 (six) hours as needed for wheezing.   levothyroxine 25 MCG tablet Commonly known as: SYNTHROID Take 25 mcg by mouth daily before breakfast.   mirtazapine 15 MG tablet Commonly known as: REMERON Take 15 mg by mouth at bedtime.   multivitamin with minerals tablet Take 1  tablet by mouth daily.   ondansetron 4 MG tablet Commonly known as: ZOFRAN Take 4 mg by mouth 2 (two) times daily.   OXYGEN Inhale 2 L into the lungs at bedtime.   pantoprazole 40 MG tablet Commonly known as: PROTONIX Take 40 mg by mouth daily.   polyethylene glycol 17 g packet Commonly known as: MIRALAX / GLYCOLAX Take 17 g by mouth daily.   primidone 50 MG tablet Commonly known as: MYSOLINE Take 100 mg by mouth every morning.   primidone 50 MG tablet Commonly known as: MYSOLINE Take 50 mg by mouth at bedtime.   sennosides-docusate sodium 8.6-50 MG tablet Commonly known as: SENOKOT-S Take 2 tablets by mouth at bedtime.   TRAZODONE HCL PO Take 12.5 mg by mouth 3 (three) times daily.       Review of Systems   In general she is not complaining of any fever or chills.  Skin does  not complain of rashes or itching at this point.  Head ears eyes nose mouth and throat does not complain of visual changes does have clear nasal drainage apparently Flonase and Zyrtec is helping some respiratory is not really complain of shortness of breath or cough she does have a history of COPD.  Cardiac does not complain of chest pain or edema.  GI is not really complaining of abdominal pain nausea vomiting diarrhea constipation today  Muscle skeletal continues to have somewhat diffuse complaints -again she has received nerve blocks in the past for.  Hip discomfort  Neurologic at times will complain of some possible neuropathic pain she is on Neurontin.  Does have a history of chronic headaches as well and does receive.  Tylenol and Norco if needed for more severe pain  Psych-she does have a history of depression and anxiety --has been seen by psychiatric nurse practitioner and has been started on trazodone  Immunization History  Administered Date(s) Administered  . Influenza, High Dose Seasonal PF 10/04/2016  . Influenza,inj,Quad PF,6+ Mos 01/13/2016  . Influenza-Unspecified  11/10/2017  . Pneumococcal-Unspecified 10/11/2011  . Tdap 01/23/2016   Pertinent  Health Maintenance Due  Topic Date Due  . PNA vac Low Risk Adult (2 of 2 - PCV13) 01/31/2019 (Originally 10/10/2012)  . INFLUENZA VACCINE  08/11/2018  . DEXA SCAN  Discontinued   Fall Risk  01/17/2018 11/09/2017 07/14/2017 04/10/2017 11/08/2016  Falls in the past year? 0 No No Yes Yes  Number falls in past yr: 0 - - 2 or more 2 or more  Injury with Fall? 0 - - Yes Yes  Comment - - - - R hip, L ankle  Risk Factor Category  - - - High Fall Risk -  Follow up Falls evaluation completed - - Falls evaluation completed -   Functional Status Survey:    Vitals:   07/04/18 1110  BP: (!) 89/56  Pulse: 82  Resp: 19  Temp: 98.1 F (36.7 C)  TempSrc: Oral  SpO2: 93%  Weight: 142 lb 9.6 oz (64.7 kg)  Height: 5\' 8"  (1.727 m)  Update blood pressure is 408/14 pulse 66 systolics appear to average more in the lower 100s Body mass index is 21.68 kg/m. Physical Exam In general this is a somewhat frail elderly female in no distress resting comfortably in bed  Her skin is warm and dry.  Eyes visual acuity appears grossly intact sclera and conjunctive are clear  Oropharynx is clear mucous membranes moist  Chest is clear to auscultation with somewhat shallow air entry there is no labored breathing  Heart is largely regular rate and rhythm with an occasional irregular beat she does not really appear to have significant lower extremity edema   Abdomen is soft nontender with positive bowel sounds   Musculoskeletal appears able to move all extremities x4 at baseline limited exam since she is in bed   Neurologic Appears grossly intact her speech is clear she does have tremors which appear to be accentuated with movement of her upper extremities bilaterally.   Psyche-- She is alert and oriented pleasant and appropriate   Labs reviewed: Recent Labs    12/05/17 03/02/18  NA 141 141  K 5.1 4.5  CL  --  102   CO2  --  29  BUN 16 11  CREATININE 1.1 1.0  CALCIUM  --  8.2   Recent Labs    12/05/17  AST 12*  ALT 8  ALKPHOS 97   Recent Labs  12/05/17 01/24/18 01/29/18  WBC 6.2 8.2 9.0  NEUTROABS 4 7 7   HGB 10.5* 9.7* 10.4*  HCT 31* 29* 30*  PLT 213 294 358   Lab Results  Component Value Date   TSH 4.17 03/02/2018   Lab Results  Component Value Date   HGBA1C 5.2 12/01/2015   Lab Results  Component Value Date   CHOL 107 12/01/2015   HDL 27 (L) 12/01/2015   LDLCALC 59 12/01/2015   TRIG 107 12/01/2015   CHOLHDL 4.0 12/01/2015    Significant Diagnostic Results in last 30 days:  No results found.  Assessment/Plan  #1- history of tremors- she continues on primidone twice a day she has been seen by Dr. Carles Collet previously for movement disorder--will see if we can obtain a reconsult--at this point continue supportive care  2.  History of COPD this appears to be stable Levobuterol--as well as Ellipta and oxygen as needed.  3.  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  At this point continue management with Norco twice a day as needed as well as Tylenol as needed and Neurontin 200 mg 3 times daily and Cymbalta 30 mg a day  #4 history of depression with anxiety she has been seen by psychiatric nurse practitioner --Lexapro has been titrated down she has been started on trazodone 12.5 mg 3 times daily she also is on Cymbalta--at this point trazodone was just recently started at this point will monitor  #5- history of atrial tachycardia at this appears to be controlled with the digoxin as well as Flecanide--digoxin level was 0.9 on lab done in February.  6.  History of GI bleed in the past she is on a PPI hemoglobin has shown stability it was 10.4 on lab done in January when coronavirus quarantine eases will try to obtain updated lab work  #7 history  of suspected allergic rhinitis she is on a course of Flonase and Zyrtec she indicates this is getting somewhat better.  8.  History of hypothyroidism she continues on Synthroid 25 mcg a day TSH was 4.17 on lab done in February  [id number]

## 2018-07-04 NOTE — Progress Notes (Signed)
This encounter was created in error - please disregard.

## 2018-07-05 ENCOUNTER — Other Ambulatory Visit: Payer: Self-pay | Admitting: Internal Medicine

## 2018-07-05 MED ORDER — HYDROCODONE-ACETAMINOPHEN 5-325 MG PO TABS: 1.0000 | ORAL_TABLET | Freq: Two times a day (BID) | ORAL | 0 refills | Status: DC | PRN

## 2018-07-11 DIAGNOSIS — F419 Anxiety disorder, unspecified: Secondary | ICD-10-CM | POA: Diagnosis not present

## 2018-07-11 DIAGNOSIS — G4700 Insomnia, unspecified: Secondary | ICD-10-CM | POA: Diagnosis not present

## 2018-07-11 DIAGNOSIS — F331 Major depressive disorder, recurrent, moderate: Secondary | ICD-10-CM | POA: Diagnosis not present

## 2018-07-11 DIAGNOSIS — R630 Anorexia: Secondary | ICD-10-CM | POA: Diagnosis not present

## 2018-07-17 DIAGNOSIS — D649 Anemia, unspecified: Secondary | ICD-10-CM | POA: Diagnosis not present

## 2018-07-17 DIAGNOSIS — D539 Nutritional anemia, unspecified: Secondary | ICD-10-CM | POA: Diagnosis not present

## 2018-07-17 LAB — CBC AND DIFFERENTIAL
HCT: 31 — AB (ref 36–46)
Hemoglobin: 10.8 — AB (ref 12.0–16.0)
Platelets: 223 (ref 150–399)
WBC: 5

## 2018-07-17 LAB — IRON,TIBC AND FERRITIN PANEL
%SAT: 29.87
Ferritin: 53.41
Iron: 86
TIBC: 287
UIBC: 201

## 2018-07-17 LAB — VITAMIN B12: Vitamin B-12: 536

## 2018-07-18 ENCOUNTER — Ambulatory Visit: Payer: Medicare Other | Admitting: Neurology

## 2018-07-18 NOTE — Progress Notes (Signed)
Virtual Visit via Video Note The purpose of this virtual visit is to provide medical care while limiting exposure to the novel coronavirus.    Consent was obtained for video visit:  Yes.   Answered questions that patient had about telehealth interaction:  Yes.   I discussed the limitations, risks, security and privacy concerns of performing an evaluation and management service by telemedicine. I also discussed with the patient that there may be a patient responsible charge related to this service. The patient expressed understanding and agreed to proceed.  Pt location: Home Physician Location: office Name of referring provider:  [name], MD I connected with Dawn [name] at patients initiation/request on 07/19/2018 at  3:30 PM EDT by video enabled telemedicine application and verified that I am speaking with the correct person using two identifiers. Pt MRN:  [id number] Pt DOB:  June 23, 1940 Video Participants:  Dawn [name];     History of Present Illness:   Pt seen in f/u for tremor.  On primidone, 100mg  in the AM and 50 mg at night.  The records that were made available to me were reviewed.  Still on inhalers for COPD.  The records that were made available to me were reviewed.  Has been seen recently by psych NP and trying to get anxiety under better control.  They are titrating down the lexapro but have added trazodone 12.5 mg tid.   Current Outpatient Medications on File Prior to Visit  Medication Sig Dispense Refill  . acetaminophen (TYLENOL) 325 MG tablet Take 2 tablets (650 mg total) by mouth every 8 (eight) hours as needed. 60 tablet 0  . aspirin 81 MG chewable tablet Chew 81 mg by mouth 2 (two) times daily.    . cetirizine (ZYRTEC) 10 MG tablet Take 10 mg by mouth at bedtime.    . digoxin (LANOXIN) 0.125 MG tablet Take 0.125 mg by mouth daily. Hold if HR <60    . docusate sodium (COLACE) 100 MG capsule Take 1 capsule (100 mg total) by mouth 2 (two) times daily  as needed for mild constipation. 10 capsule 0  . DULoxetine (CYMBALTA) 30 MG capsule Take 60 mg by mouth daily. for depression/pain     . flecainide (TAMBOCOR) 50 MG tablet Take 50 mg by mouth every 12 (twelve) hours.    . fluticasone (FLONASE ALLERGY RELIEF) 50 MCG/ACT nasal spray Place 1 spray into both nostrils daily.    Marland Kitchen levalbuterol (XOPENEX) 1.25 MG/3ML nebulizer solution Take 1.25 mg by nebulization every 6 (six) hours as needed for wheezing.     Marland Kitchen levothyroxine (SYNTHROID, LEVOTHROID) 25 MCG tablet Take 25 mcg by mouth daily before breakfast.    . mirtazapine (REMERON) 15 MG tablet Take 15 mg by mouth at bedtime.     . Multiple Vitamins-Minerals (MULTIVITAMIN WITH MINERALS) tablet Take 1 tablet by mouth daily.    . naphazoline-glycerin (CLEAR EYES REDNESS RELIEF) 0.012-0.2 % SOLN Place 1 drop into both eyes 2 (two) times daily.    . ondansetron (ZOFRAN) 4 MG tablet Take 4 mg by mouth 2 (two) times daily.    . OXYGEN Inhale 2 L into the lungs at bedtime.    . pantoprazole (PROTONIX) 40 MG tablet Take 40 mg by mouth daily.    . polyethylene glycol (MIRALAX / GLYCOLAX) packet Take 17 g by mouth daily.    . primidone (MYSOLINE) 50 MG tablet Take 100 mg by mouth every morning.     . primidone (MYSOLINE) 50  MG tablet Take 50 mg by mouth at bedtime.     . sennosides-docusate sodium (SENOKOT-S) 8.6-50 MG tablet Take 2 tablets by mouth at bedtime.     . TRAZODONE HCL PO Take 12.5 mg by mouth 3 (three) times daily.    Loura Pardon Salicylate (ASPERCREME EX) Apply 1 application topically 2 (two) times daily as needed (Posterior neck muscle spasm).     Marland Kitchen umeclidinium bromide (INCRUSE ELLIPTA) 62.5 MCG/INH AEPB Inhale 1 puff into the lungs daily. (SHAKE WELL)    . escitalopram (LEXAPRO) 20 MG tablet 10 mg. 0.5 mg qd x7 days and then discontinue    . gabapentin (NEURONTIN) 100 MG capsule Take 100 mg by mouth 3 (three) times daily. Take 2 tablets     No current facility-administered medications on  file prior to visit.      Observations/Objective:   There were no vitals filed for this visit. GEN:  The patient appears stated age and is in NAD.  Neurological examination:  Orientation: The patient is alert and oriented x3. Cranial nerves: There is good facial symmetry. There is no facial hypomimia.  The speech is fluent and clear. Soft palate rises symmetrically and there is no tongue deviation. Hearing is intact to conversational tone. Motor: Strength is at least antigravity x 4.   Shoulder shrug is equal and symmetric.  There is no pronator drift.  Movement examination: Tone: unable Abnormal movements: there is head tremor.  There is bilateral UE postural tremor, at least moderate.  She has some trouble with Archimedes spirals. Coordination:  There is no decremation with RAM's with hand opening and closing, finger taps. Gait and Station: Did not walk the patient.  She is in a wheelchair.    Assessment and Plan:   1.  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  Discussed with her that we likely will not get rid of tremor via medication alone, and I am doubtful she is a DBS candidate.  -We will try to increase primidone to 50 mg, 2 tablets in the morning and 2 tablets at night for 2 weeks and then increase to primidone, 50 mg, 3 tablets in the morning and 2 tablets at night thereafter.  Discussed risk, benefits, and side effects.  -Discussed weighted gloves and told her to try to get them off of [place].  If that helps, we can certainly try the readi steadi system as well.  Follow Up Instructions:  5 months  -I discussed the assessment and treatment plan with the patient. The patient was provided an opportunity to ask questions and all were answered. The patient agreed with the plan and demonstrated an understanding of the instructions.   The patient was advised to call back or seek an in-person evaluation if the symptoms worsen or if the  condition fails to improve as anticipated.    Total Time spent in visit with the patient was:  15 min, of which more than 50% of the time was spent in counseling.   Pt understands and agrees with the plan of care outlined.     Alonza Bogus, DO

## 2018-07-19 ENCOUNTER — Encounter: Payer: Self-pay | Admitting: Neurology

## 2018-07-19 ENCOUNTER — Other Ambulatory Visit: Payer: Self-pay | Admitting: Adult Health

## 2018-07-19 ENCOUNTER — Other Ambulatory Visit: Payer: Self-pay

## 2018-07-19 ENCOUNTER — Telehealth (INDEPENDENT_AMBULATORY_CARE_PROVIDER_SITE_OTHER): Payer: Medicare Other | Admitting: Neurology

## 2018-07-19 DIAGNOSIS — G250 Essential tremor: Secondary | ICD-10-CM | POA: Diagnosis not present

## 2018-07-19 DIAGNOSIS — D649 Anemia, unspecified: Secondary | ICD-10-CM | POA: Diagnosis not present

## 2018-07-19 DIAGNOSIS — D539 Nutritional anemia, unspecified: Secondary | ICD-10-CM | POA: Diagnosis not present

## 2018-07-19 MED ORDER — HYDROCODONE-ACETAMINOPHEN 5-325 MG PO TABS: 1.0000 | ORAL_TABLET | Freq: Two times a day (BID) | ORAL | 0 refills | Status: DC | PRN

## 2018-07-20 ENCOUNTER — Telehealth: Payer: Self-pay

## 2018-07-20 NOTE — Telephone Encounter (Signed)
Orders placed  Fax this morning to [phone]

## 2018-07-20 NOTE — Telephone Encounter (Signed)
-----   Message from [place], DO sent at 07/19/2018  3:59 PM EDT ----- [place],  Send an order to her nursing home via fax for primidone - 50 mg - 2 in the AM, 2 in the PM for 2 weeks and then primidone - 50 mg - 3 in the AM, 2 in the PM thereafter  Hinton Dyer,  Make a 4-5 month f/u.  Is a SNF pt but we may be doing in person then

## 2018-07-23 ENCOUNTER — Encounter: Payer: Self-pay | Admitting: Adult Health

## 2018-07-23 ENCOUNTER — Non-Acute Institutional Stay (SKILLED_NURSING_FACILITY): Payer: Medicare Other | Admitting: Adult Health

## 2018-07-23 DIAGNOSIS — E034 Atrophy of thyroid (acquired): Secondary | ICD-10-CM | POA: Diagnosis not present

## 2018-07-23 DIAGNOSIS — R251 Tremor, unspecified: Secondary | ICD-10-CM

## 2018-07-23 DIAGNOSIS — I471 Supraventricular tachycardia: Secondary | ICD-10-CM

## 2018-07-23 DIAGNOSIS — J410 Simple chronic bronchitis: Secondary | ICD-10-CM | POA: Diagnosis not present

## 2018-07-23 DIAGNOSIS — G894 Chronic pain syndrome: Secondary | ICD-10-CM | POA: Diagnosis not present

## 2018-07-23 NOTE — Progress Notes (Signed)
Location:  [facility] Room Number: 202/A Place of Service:  SNF (31) Provider:  Durenda Age, DNP, FNP-BC  Patient Care Team: [name], MD as PCP - General (Internal Medicine) Medina-Vargas, [name], NP as Nurse Practitioner (Internal Medicine)  Extended Emergency Contact Information Primary Emergency Contact: Hatfield,Lisa Address: [address]          [place], [place] [address] [place] Work Phone: [phone] Mobile Phone: [phone] Relation: Daughter  Code Status:  Full Code  Goals of care: Advanced Directive information Advanced Directives 07/23/2018  Does Patient Have a Medical Advance Directive? Yes  Type of Advance Directive (No Data)  Does patient want to make changes to medical advance directive? No - Patient declined  Would patient like information on creating a medical advance directive? -     Chief Complaint  Patient presents with  . Medical Management of Chronic Issues    Routine visit of medical management  . Immunizations    Prevnar-13    HPI:  Pt is a 78 y.o. female seen today for medical management of chronic diseases. She has PMH of COPD, anemia, hypothyroidism, chronic pain syndrome and depression. She was seen in her room today and verbalized that the recent order for Aspercreme patch does not work and wanted to have her Norco increased. She had a fall history and sustained an ankle fracture in the past. Explained that Norco might cause her to fall. ASA 81 mg was recently decrease from BID to daily per pharmacy recommendation.   Past Medical History:  Diagnosis Date  . COPD (chronic obstructive pulmonary disease) ([place])   . History of anxiety   . History of depression   . Spinal stenosis   . UTI (urinary tract infection)    Past Surgical History:  Procedure Laterality Date  . ANKLE FRACTURE SURGERY Left   . CATARACT EXTRACTION, BILATERAL    . FLEXIBLE SIGMOIDOSCOPY Left 11/18/2015   Procedure: FLEXIBLE SIGMOIDOSCOPY;  Surgeon: [name], MD;  Location: [facility];  Service: Endoscopy;  Laterality: Left;  . FLEXIBLE SIGMOIDOSCOPY N/A 11/20/2015   Procedure: FLEXIBLE SIGMOIDOSCOPY;  Surgeon: [name], MD;  Location: [facility] ENDOSCOPY;  Service: Endoscopy;  Laterality: N/A;  . HIP ARTHROPLASTY Right 01/12/2016   Procedure: ARTHROPLASTY BIPOLAR HIP (HEMIARTHROPLASTY);  Surgeon: [name], MD;  Location: [facility];  Service: Orthopedics;  Laterality: Right;    Allergies  Allergen Reactions  . Biofreeze [Menthol (Topical Analgesic)] Rash    By history Aspercreme does not cause a rash    Outpatient Encounter Medications as of 07/23/2018  Medication Sig  . acetaminophen (TYLENOL) 325 MG tablet Take 2 tablets (650 mg total) by mouth every 8 (eight) hours as needed.  Marland Kitchen aspirin 81 MG chewable tablet Chew 81 mg by mouth 2 (two) times daily.  . cetirizine (ZYRTEC) 10 MG tablet Take 10 mg by mouth at bedtime.  . digoxin (LANOXIN) 0.125 MG tablet Take 0.125 mg by mouth daily. Hold if HR <60  . docusate sodium (COLACE) 100 MG capsule Take 1 capsule (100 mg total) by mouth 2 (two) times daily as needed for mild constipation.  . DULoxetine (CYMBALTA) 30 MG capsule Take 60 mg by mouth daily. for depression/pain   . flecainide (TAMBOCOR) 50 MG tablet Take 50 mg by mouth every 12 (twelve) hours.  . fluticasone (FLONASE ALLERGY RELIEF) 50 MCG/ACT nasal spray Place 1 spray into both nostrils daily.  Marland Kitchen gabapentin (NEURONTIN) 100 MG capsule Take 200 mg by mouth 3 (  three) times daily. Take 2 tablets  . HYDROcodone-acetaminophen (NORCO/VICODIN) 5-325 MG tablet Take 1 tablet by mouth every 12 (twelve) hours as needed for up to 30 doses for moderate pain. T  . levalbuterol (XOPENEX) 1.25 MG/3ML nebulizer solution Take 1.25 mg by nebulization every 6 (six) hours as needed for wheezing.   Marland Kitchen levothyroxine (SYNTHROID, LEVOTHROID) 25 MCG tablet Take 25 mcg by mouth daily before breakfast.  . mirtazapine  (REMERON) 15 MG tablet Take 15 mg by mouth at bedtime.   . Multiple Vitamins-Minerals (MULTIVITAMIN WITH MINERALS) tablet Take 1 tablet by mouth daily.  . naphazoline-glycerin (CLEAR EYES REDNESS RELIEF) 0.012-0.2 % SOLN Place 1 drop into both eyes 2 (two) times daily.  . ondansetron (ZOFRAN) 4 MG tablet Take 4 mg by mouth 2 (two) times daily.  . OXYGEN Inhale 2 L into the lungs at bedtime.  . pantoprazole (PROTONIX) 40 MG tablet Take 40 mg by mouth daily.  . polyethylene glycol (MIRALAX / GLYCOLAX) packet Take 17 g by mouth daily.  . primidone (MYSOLINE) 50 MG tablet Take 100 mg by mouth every morning.   . primidone (MYSOLINE) 50 MG tablet Take 50 mg by mouth at bedtime.   . sennosides-docusate sodium (SENOKOT-S) 8.6-50 MG tablet Take 2 tablets by mouth at bedtime.   . TRAZODONE HCL PO Take 12.5 mg by mouth 3 (three) times daily.  Loura Pardon Salicylate (ASPERCREME EX) Apply 1 application topically 2 (two) times daily as needed (Posterior neck muscle spasm).   Marland Kitchen umeclidinium bromide (INCRUSE ELLIPTA) 62.5 MCG/INH AEPB Inhale 1 puff into the lungs daily. (SHAKE WELL)  . escitalopram (LEXAPRO) 20 MG tablet 10 mg. 0.5 mg qd x7 days and then discontinue   No facility-administered encounter medications on file as of 07/23/2018.     Review of Systems  GENERAL: No change in appetite, no fatigue, no weight changes, no fever, chills or weakness MOUTH and THROAT: Denies oral discomfort, gingival pain or bleeding, pain from teeth or hoarseness   RESPIRATORY: no cough, SOB, DOE, wheezing, hemoptysis CARDIAC: No chest pain, edema or palpitations GI: No abdominal pain, diarrhea, constipation, heart burn, nausea or vomiting GU: Denies dysuria, frequency, hematuria, incontinence, or discharge NEUROLOGICAL: Denies dizziness, syncope, numbness PSYCHIATRIC: Denies feelings of depression or anxiety. No report of hallucinations, insomnia, paranoia, or agitation   Immunization History  Administered Date(s)  Administered  . Influenza, High Dose Seasonal PF 10/04/2016  . Influenza,inj,Quad PF,6+ Mos 01/13/2016  . Influenza-Unspecified 11/10/2017  . Pneumococcal-Unspecified 10/11/2011  . Tdap 01/23/2016   Pertinent  Health Maintenance Due  Topic Date Due  . PNA vac Low Risk Adult (2 of 2 - PCV13) 01/31/2019 (Originally 10/10/2012)  . INFLUENZA VACCINE  08/11/2018  . DEXA SCAN  Discontinued   Fall Risk  07/19/2018 01/17/2018 11/09/2017 07/14/2017 04/10/2017  Falls in the past year? 0 0 No No Yes  Number falls in past yr: 0 0 - - 2 or more  Injury with Fall? 0 0 - - Yes  Comment - - - - -  Risk Factor Category  - - - - High Fall Risk  Follow up - Falls evaluation completed - - Falls evaluation completed     Vitals:   07/23/18 0912  BP: 96/60  Pulse: 72  Resp: 19  Temp: 97.9 F (36.6 C)  TempSrc: Oral  SpO2: 96%  Weight: 146 lb (66.2 kg)  Height: 5\' 8"  (1.727 m)   Body mass index is 22.2 kg/m.  Physical Exam  GENERAL APPEARANCE: Well nourished.  In no acute distress. Normal body habitus SKIN:  Skin is warm and dry.  MOUTH and THROAT: Lips are without lesions. Oral mucosa is moist and without lesions. Tongue is normal in shape, size, and color and without lesions RESPIRATORY: Breathing is even & unlabored, BS CTAB CARDIAC: RRR, no murmur,no extra heart sounds, no edema GI: Abdomen soft, normal BS, no masses, no tenderness EXTREMITIES:  Able to move X 4 extremities NEUROLOGICAL: +tremor. Speech is clear.Alert and oriented X 3.  PSYCHIATRIC:  Affect and behavior are appropriate  Labs reviewed: Recent Labs    12/05/17 03/02/18  NA 141 141  K 5.1 4.5  CL  --  102  CO2  --  29  BUN 16 11  CREATININE 1.1 1.0  CALCIUM  --  8.2   Recent Labs    12/05/17  AST 12*  ALT 8  ALKPHOS 97   Recent Labs    12/05/17 01/24/18 01/29/18  WBC 6.2 8.2 9.0  NEUTROABS 4 7 7   HGB 10.5* 9.7* 10.4*  HCT 31* 29* 30*  PLT 213 294 358   Lab Results  Component Value Date   TSH 4.17  03/02/2018   Lab Results  Component Value Date   HGBA1C 5.2 12/01/2015   Lab Results  Component Value Date   CHOL 107 12/01/2015   HDL 27 (L) 12/01/2015   LDLCALC 59 12/01/2015   TRIG 107 12/01/2015   CHOLHDL 4.0 12/01/2015    Significant Diagnostic Results in last 30 days:  No results found.  Assessment/Plan  1. Paroxysmal junctional tachycardia (HCC) - rate-controlled, continue digoxin 125 mcg 1 tab daily and flecainide 50 mg every 12 hours  2. Hypothyroidism due to acquired atrophy of thyroid Lab Results  Component Value Date   TSH 4.17 03/02/2018  -Continue levothyroxine 25 mcg 1 tab daily  3. Chronic pain syndrome -Discontinue Aspercreme patch per patient request, acetaminophen 325 mg 2 tabs = 650 mg every morning and at bedtime, Norco 5-325 mg every 12 hours as needed, gabapentin 100 mg 2 capsules = 200 mg 3 times a day  4. Simple chronic bronchitis (HCC) -No wheezing nor SOB, continue levalbuterol nebulization PRN and Incruise  Ellipta inhalation daily  5. Tremor -Stable, continue primidone 50 mg 2 tabs = 100 mg every morning and 1 tab at bedtime   Family/ staff Communication:  Discussed plan of care with resident.  Labs/tests ordered:  None  Goals of care:   Long-term care   Durenda Age, DNP, FNP-BC [facility] and Adult Medicine [phone] (Monday-Friday 8:00 a.m. - 5:00 p.m.) [phone] (after hours)

## 2018-07-24 DIAGNOSIS — E039 Hypothyroidism, unspecified: Secondary | ICD-10-CM | POA: Insufficient documentation

## 2018-08-09 ENCOUNTER — Other Ambulatory Visit: Payer: Self-pay | Admitting: Adult Health

## 2018-08-09 DIAGNOSIS — Z515 Encounter for palliative care: Secondary | ICD-10-CM

## 2018-08-09 MED ORDER — HYDROCODONE-ACETAMINOPHEN 5-325 MG PO TABS: 1.0000 | ORAL_TABLET | Freq: Two times a day (BID) | ORAL | 0 refills | Status: DC | PRN

## 2018-08-13 ENCOUNTER — Other Ambulatory Visit: Payer: Self-pay

## 2018-08-13 MED ORDER — HYDROCODONE-ACETAMINOPHEN 5-325 MG PO TABS: 1.0000 | ORAL_TABLET | Freq: Two times a day (BID) | ORAL | 0 refills | Status: DC | PRN

## 2018-08-16 ENCOUNTER — Non-Acute Institutional Stay (SKILLED_NURSING_FACILITY): Payer: Medicare Other | Admitting: Internal Medicine

## 2018-08-16 ENCOUNTER — Encounter: Payer: Self-pay | Admitting: Internal Medicine

## 2018-08-16 DIAGNOSIS — F339 Major depressive disorder, recurrent, unspecified: Secondary | ICD-10-CM | POA: Diagnosis not present

## 2018-08-16 DIAGNOSIS — L539 Erythematous condition, unspecified: Secondary | ICD-10-CM

## 2018-08-16 DIAGNOSIS — R5382 Chronic fatigue, unspecified: Secondary | ICD-10-CM | POA: Diagnosis not present

## 2018-08-16 NOTE — Assessment & Plan Note (Addendum)
Recheck CBC and TSH.  If these are stable are normal; Psych NP follow-up Change Zyrtec to loratadine because of excessive sleepiness.

## 2018-08-16 NOTE — Assessment & Plan Note (Signed)
Psych NP follow-up will be requested if pending labs are normal or stable.

## 2018-08-16 NOTE — Progress Notes (Signed)
NURSING HOME LOCATION:  [facility] ROOM NUMBER: 202/A   CODE STATUS: Full Code   PCP: [name] MD.   This is a nursing facility follow up of chronic medical diagnoses.  Today she complains of marked fatigue and of "not feeling well".  Interim medical record and care since last [facility] visit was updated with review of diagnostic studies and change in clinical status since last visit were documented.  HPI: She is a permanent resident of the facility with diagnoses of spinal stenosis, tremor,advanced oxygen dependent COPD, chronic depression, & recurrent falls in the context of polypharmacy. Past medical and surgical history were reviewed as well as family history and social history.  She complains of being sleepy and having no energy for "for months".  TSH was therapeutic and stable in February.  In July her anemia had improved and iron studies and B12 level were normal.  Initially she stated that her shortness of breath is stable but she states that it is problematic intermittently.  She asked why she was so short of breath.  We reviewed her smoking history of at least 5 decades.  She does realize she has a diagnosis of emphysema. She denies any bleeding dyscrasias or symptoms of an extrinsic or infectious nature. She describes chronic right hip pain.  She also admits to chronic depression. She is concerned about redness over her ankles.  Review of systems:  Constitutional: No fever, significant weight change Eyes: No redness, discharge, pain, vision change ENT/mouth: No nasal congestion,  purulent discharge, earache, change in hearing, sore throat  Cardiovascular: No chest pain, palpitations, paroxysmal nocturnal dyspnea, claudication, edema  Respiratory: No cough, sputum production, hemoptysis  Gastrointestinal: No heartburn, dysphagia, abdominal pain, nausea /vomiting, rectal bleeding, melena, change in bowels Genitourinary: No dysuria, hematuria, pyuria,  incontinence, nocturia Dermatologic: No rash, pruritus Neurologic: No dizziness, headache, syncope, seizures, numbness, tingling Endocrine: No change in hair/nails, excessive thirst, excessive hunger, excessive urination  Hematologic/lymphatic: No significant bruising, lymphadenopathy, abnormal bleeding Allergy/immunology: No itchy/watery eyes, significant sneezing, urticaria, angioedema  Physical exam:  Pertinent or positive findings: When I entered her room at approximately 1:30 pm  she was asleep in her bed.  Upon arousal she was communicative but affect was markedly flat.  She remained somewhat somnolent.  She has an upper partial.  The mandible is edentulous.  She had high-pitched scattered expiratory wheezes.  Pedal pulses are decreased.  The left dorsalis pedis pulse is the strongest of the four pulses.  She was weak to opposition in all extremities.  With testing the right lower extremity she complained of pain in the right hip.  She exhibited a course tremor of the left hand.  There was erythema over the right lateral and left medial malleoli which was essentially nonblanching. Clinically no cellulitis or skin breakdown.Toenails were thickened.  She has flexion contractures of the toes.  General appearance: Adequately nourished; no acute distress, increased work of breathing is present.   Lymphatic: No lymphadenopathy about the head, neck, axilla. Eyes: No conjunctival inflammation or lid edema is present. There is no scleral icterus. Ears:  External ear exam shows no significant lesions or deformities.   Nose:  External nasal examination shows no deformity or inflammation. Nasal mucosa are pink and moist without lesions, exudates Oral exam:  Lips and gums are healthy appearing. There is no oropharyngeal erythema or exudate. Neck:  No thyromegaly, masses, tenderness noted.    Heart:  Normal rate and regular rhythm. S1 and S2 normal  without gallop, murmur, click, rub .  Abdomen: Bowel  sounds are normal. Abdomen is soft and nontender with no organomegaly, hernias, masses. GU: Deferred  Extremities:  No cyanosis, clubbing, edema  Neurologic exam : Balance, Rhomberg, finger to nose testing could not be completed due to clinical state Skin: Warm & dry w/o tenting. No significant lesions or rash.  See summary under each active problem in the Problem List with associated updated therapeutic plan

## 2018-08-16 NOTE — Patient Instructions (Signed)
See assessment and plan under each diagnosis in the problem list and acutely for this visit 

## 2018-08-17 DIAGNOSIS — F339 Major depressive disorder, recurrent, unspecified: Secondary | ICD-10-CM | POA: Diagnosis not present

## 2018-08-17 DIAGNOSIS — G629 Polyneuropathy, unspecified: Secondary | ICD-10-CM | POA: Diagnosis not present

## 2018-08-17 DIAGNOSIS — E039 Hypothyroidism, unspecified: Secondary | ICD-10-CM | POA: Diagnosis not present

## 2018-08-17 DIAGNOSIS — D649 Anemia, unspecified: Secondary | ICD-10-CM | POA: Diagnosis not present

## 2018-08-17 DIAGNOSIS — G9341 Metabolic encephalopathy: Secondary | ICD-10-CM | POA: Diagnosis not present

## 2018-08-17 LAB — CBC: RBC: 3.26 — AB (ref 3.87–5.11)

## 2018-08-17 LAB — TSH: TSH: 4 (ref 0.41–5.90)

## 2018-08-17 LAB — CBC AND DIFFERENTIAL
HCT: 31 — AB (ref 36–46)
Hemoglobin: 10.5 — AB (ref 12.0–16.0)
Platelets: 270 (ref 150–399)
WBC: 6

## 2018-08-20 ENCOUNTER — Other Ambulatory Visit: Payer: Self-pay | Admitting: Adult Health

## 2018-08-20 MED ORDER — HYDROCODONE-ACETAMINOPHEN 5-325 MG PO TABS: 0.5000 | ORAL_TABLET | Freq: Four times a day (QID) | ORAL | 0 refills | Status: DC | PRN

## 2018-09-05 DIAGNOSIS — F331 Major depressive disorder, recurrent, moderate: Secondary | ICD-10-CM | POA: Diagnosis not present

## 2018-09-05 DIAGNOSIS — F419 Anxiety disorder, unspecified: Secondary | ICD-10-CM | POA: Diagnosis not present

## 2018-09-05 DIAGNOSIS — G4700 Insomnia, unspecified: Secondary | ICD-10-CM | POA: Diagnosis not present

## 2018-09-05 DIAGNOSIS — R630 Anorexia: Secondary | ICD-10-CM | POA: Diagnosis not present

## 2018-09-07 ENCOUNTER — Non-Acute Institutional Stay (SKILLED_NURSING_FACILITY): Payer: Medicare Other | Admitting: Adult Health

## 2018-09-07 ENCOUNTER — Encounter: Payer: Self-pay | Admitting: Adult Health

## 2018-09-07 DIAGNOSIS — F419 Anxiety disorder, unspecified: Secondary | ICD-10-CM

## 2018-09-07 DIAGNOSIS — F339 Major depressive disorder, recurrent, unspecified: Secondary | ICD-10-CM

## 2018-09-07 DIAGNOSIS — J410 Simple chronic bronchitis: Secondary | ICD-10-CM

## 2018-09-07 DIAGNOSIS — R251 Tremor, unspecified: Secondary | ICD-10-CM

## 2018-09-07 DIAGNOSIS — I471 Supraventricular tachycardia: Secondary | ICD-10-CM | POA: Diagnosis not present

## 2018-09-07 NOTE — Progress Notes (Signed)
Location:  [facility] Room Number: 202/A Place of Service:  SNF (31) Provider:  Durenda Age, DNP, FNP-BC  Patient Care Team: [name], MD as PCP - General (Internal Medicine) Medina-Vargas, [name], NP as Nurse Practitioner (Internal Medicine)  Extended Emergency Contact Information Primary Emergency Contact: Hatfield,Lisa Address: [address]          [place],  [address] [place] Work Phone: [phone] Mobile Phone: [phone] Relation: Daughter  Code Status:  Full Code  Goals of care: Advanced Directive information Advanced Directives 09/07/2018  Does Patient Have a Medical Advance Directive? Yes  Type of Advance Directive (No Data)  Does patient want to make changes to medical advance directive? No - Patient declined  Would patient like information on creating a medical advance directive? -     Chief Complaint  Patient presents with  . Medical Management of Chronic Issues    Routine visit of medical management    HPI:  Pt is a 78 y.o. female seen today for medical management of chronic diseases. She has PMH of COPD, anemia, hypothyroidism, chronic pain syndrome and depression.  She was seen in her room today.  She denies having palpitations.  She is currently taking flecainide and digoxin for paroxysmal junctional tachycardia.  No reported SOB nor wheezing.  He takes Incruse and levalbuterol for COPD.  She has chronic pain and takes Norco, gabapentin and Cymbalta.  She denies having heartburn.  She takes pantoprazole for GERD.   Past Medical History:  Diagnosis Date  . COPD (chronic obstructive pulmonary disease) ([place])   . History of anxiety   . History of depression   . Spinal stenosis   . UTI (urinary tract infection)    Past Surgical History:  Procedure Laterality Date  . ANKLE FRACTURE SURGERY Left   . CATARACT EXTRACTION, BILATERAL    . FLEXIBLE SIGMOIDOSCOPY Left 11/18/2015   Procedure:  FLEXIBLE SIGMOIDOSCOPY;  Surgeon: [name], MD;  Location: [facility];  Service: Endoscopy;  Laterality: Left;  . FLEXIBLE SIGMOIDOSCOPY N/A 11/20/2015   Procedure: FLEXIBLE SIGMOIDOSCOPY;  Surgeon: [name], MD;  [facility]COPY;  Service: Endoscopy;  Laterality: N/A;  . HIP ARTHROPLASTY Right 01/12/2016   Procedure: ARTHROPLASTY BIPOLAR HIP (HEMIARTHROPLASTY);  Surgeon: [name], MD;  Location: [facility];  Service: Orthopedics;  Laterality: Right;    Allergies  Allergen Reactions  . Biofreeze [Menthol (Topical Analgesic)] Rash    By history Aspercreme does not cause a rash    Outpatient Encounter Medications as of 09/07/2018  Medication Sig  . acetaminophen (TYLENOL) 325 MG tablet Take 650 mg by mouth every 6 (six) hours as needed.  Marland Kitchen aspirin 81 MG chewable tablet Chew 81 mg by mouth 2 (two) times daily.  . digoxin (LANOXIN) 0.125 MG tablet Take 0.125 mg by mouth daily. Hold if HR <60  . docusate sodium (COLACE) 100 MG capsule Take 1 capsule (100 mg total) by mouth 2 (two) times daily as needed for mild constipation.  . DULoxetine (CYMBALTA) 30 MG capsule Take 60 mg by mouth daily. for depression/pain   . flecainide (TAMBOCOR) 50 MG tablet Take 50 mg by mouth every 12 (twelve) hours.  . fluticasone (FLONASE ALLERGY RELIEF) 50 MCG/ACT nasal spray Place 1 spray into both nostrils daily.  Marland Kitchen gabapentin (NEURONTIN) 100 MG capsule Take 200 mg by mouth 3 (three) times daily. Take 2 tablets  . HYDROcodone-acetaminophen (NORCO/VICODIN) 5-325 MG tablet Take 0.5 tablets by mouth every 6 (six) hours  as needed for moderate pain. Give **1/2 tablet by mouth q 6 hrs PRN  . levalbuterol (XOPENEX) 1.25 MG/3ML nebulizer solution Take 1.25 mg by nebulization every 6 (six) hours as needed for wheezing.   Marland Kitchen levothyroxine (SYNTHROID, LEVOTHROID) 25 MCG tablet Take 25 mcg by mouth daily before breakfast.  . Lidocaine (ASPERCREME LIDOCAINE) 4 % PTCH APPLY 1 PATCH TRANSDERMALLY TO LOWER BACK AT BEDTIME  FOR OSTEOARTHRITIS  . loratadine (CLARITIN) 10 MG tablet Take 10 mg by mouth at bedtime.  . mirtazapine (REMERON) 15 MG tablet Take 15 mg by mouth at bedtime.   . Multiple Vitamins-Minerals (MULTIVITAMIN WITH MINERALS) tablet Take 1 tablet by mouth daily.  . naphazoline-glycerin (CLEAR EYES REDNESS RELIEF) 0.012-0.2 % SOLN Place 1 drop into both eyes 2 (two) times daily.  . ondansetron (ZOFRAN) 4 MG tablet Take 4 mg by mouth 2 (two) times daily.  . OXYGEN Inhale 2 L into the lungs at bedtime.  . pantoprazole (PROTONIX) 40 MG tablet Take 40 mg by mouth daily.  . polyethylene glycol (MIRALAX / GLYCOLAX) packet Take 17 g by mouth daily.  . primidone (MYSOLINE) 50 MG tablet Take 100 mg by mouth every morning.   . primidone (MYSOLINE) 50 MG tablet Take 50 mg by mouth at bedtime.   . sennosides-docusate sodium (SENOKOT-S) 8.6-50 MG tablet Take 2 tablets by mouth at bedtime.   . TRAZODONE HCL PO Take 12.5 mg by mouth 3 (three) times daily.  Marland Kitchen umeclidinium bromide (INCRUSE ELLIPTA) 62.5 MCG/INH AEPB Inhale 1 puff into the lungs daily. (SHAKE WELL)  . escitalopram (LEXAPRO) 20 MG tablet 10 mg. 0.5 mg qd x7 days and then discontinue  . [DISCONTINUED] cetirizine (ZYRTEC) 10 MG tablet Take 10 mg by mouth at bedtime.   No facility-administered encounter medications on file as of 09/07/2018.     Review of Systems  GENERAL: No new change in appetite, no fatigue, no weight changes, no fever, chills or weakness MOUTH and THROAT: Denies oral discomfort, gingival pain or bleeding RESPIRATORY: no cough, SOB, DOE, wheezing, hemoptysis CARDIAC: No chest pain, edema or palpitations GI: No abdominal pain, diarrhea, constipation, heart burn, nausea or vomiting GU: Denies dysuria, frequency, hematuria, incontinence, or discharge NEUROLOGICAL: Denies dizziness, syncope, numbness, or headache PSYCHIATRIC: Denies feelings of depression or anxiety. No report of hallucinations, insomnia, paranoia, or agitation     Immunization History  Administered Date(s) Administered  . Influenza, High Dose Seasonal PF 10/04/2016  . Influenza,inj,Quad PF,6+ Mos 01/13/2016  . Influenza-Unspecified 11/10/2017  . Pneumococcal-Unspecified 10/11/2011  . Tdap 01/23/2016   Pertinent  Health Maintenance Due  Topic Date Due  . INFLUENZA VACCINE  09/16/2018 (Originally 08/11/2018)  . PNA vac Low Risk Adult  Completed  . DEXA SCAN  Discontinued   Fall Risk  07/19/2018 01/17/2018 11/09/2017 07/14/2017 04/10/2017  Falls in the past year? 0 0 No No Yes  Number falls in past yr: 0 0 - - 2 or more  Injury with Fall? 0 0 - - Yes  Comment - - - - -  Risk Factor Category  - - - - High Fall Risk  Follow up - Falls evaluation completed - - Falls evaluation completed     Vitals:   09/07/18 0954  BP: 112/64  Pulse: 71  Resp: 18  Temp: 97.7 F (36.5 C)  TempSrc: Oral  SpO2: 95%  Weight: 145 lb 6.4 oz (66 kg)  Height: 5\' 8"  (1.727 m)   Body mass index is 22.11 kg/m.  Physical Exam  GENERAL  APPEARANCE: Well nourished. In no acute distress. Normal body habitus SKIN:  Skin is warm and dry.  MOUTH and THROAT: Lips are without lesions. Oral mucosa is moist and without lesions. Tongue is normal in shape, size, and color and without lesions RESPIRATORY: Breathing is even & unlabored, BS CTAB CARDIAC: RRR, no murmur,no extra heart sounds, no edema GI: Abdomen soft, normal BS, no masses, no tenderness EXTREMITIES:  Able to move X 4 extremities NEUROLOGICAL: +tremor on lips. Speech is clear. Alert and oriented X 3.  PSYCHIATRIC:  Affect and behavior are appropriate  Labs reviewed: Recent Labs    12/05/17 03/02/18  NA 141 141  K 5.1 4.5  CL  --  102  CO2  --  29  BUN 16 11  CREATININE 1.1 1.0  CALCIUM  --  8.2   Recent Labs    12/05/17  AST 12*  ALT 8  ALKPHOS 97   Recent Labs    12/05/17 01/24/18 01/29/18 07/17/18  WBC 6.2 8.2 9.0 5.0  NEUTROABS 4 7 7   --   HGB 10.5* 9.7* 10.4* 10.8*  HCT 31* 29* 30* 31*  PLT  213 294 358 223   Lab Results  Component Value Date   TSH 4.17 03/02/2018   Lab Results  Component Value Date   HGBA1C 5.2 12/01/2015   Lab Results  Component Value Date   CHOL 107 12/01/2015   HDL 27 (L) 12/01/2015   LDLCALC 59 12/01/2015   TRIG 107 12/01/2015   CHOLHDL 4.0 12/01/2015     Assessment/Plan  1. Tremor - has mild tremors on lips, continue Primidone  2. Paroxysmal junctional tachycardia (HCC) - denies palpitations, rate-controlled, continue Flecainide,   3. Simple chronic bronchitis (HCC) -no wheezing, continue O2 at bedtime, Levalbuterol  PRN and Incruise  4. Depression, recurrent ([place]) - gets out of her room at times and interacts with other residents, weight is stable 145.4 lbs Body mass index is 22.11 kg/m., continue Remeron and Cymbalta  5. Anxiety - she constantly asks for staff and provider for pain medications, tremors, rashes, continue Trazodone, followed up by psych NP    Family/ staff Communication:  Discussed plan of care with resident.  Labs/tests ordered:  None  Goals of care:  Long-term care    Durenda Age, DNP, FNP-BC [facility] and Adult Medicine [phone] (Monday-Friday 8:00 a.m. - 5:00 p.m.) [phone] (after hours)

## 2018-09-20 ENCOUNTER — Other Ambulatory Visit: Payer: Self-pay | Admitting: Adult Health

## 2018-09-20 MED ORDER — HYDROCODONE-ACETAMINOPHEN 5-325 MG PO TABS: 0.5000 | ORAL_TABLET | Freq: Four times a day (QID) | ORAL | 0 refills | Status: DC | PRN

## 2018-10-03 ENCOUNTER — Encounter: Payer: Self-pay | Admitting: Adult Health

## 2018-10-03 ENCOUNTER — Non-Acute Institutional Stay (SKILLED_NURSING_FACILITY): Payer: Medicare Other | Admitting: Adult Health

## 2018-10-03 DIAGNOSIS — R251 Tremor, unspecified: Secondary | ICD-10-CM

## 2018-10-03 DIAGNOSIS — J410 Simple chronic bronchitis: Secondary | ICD-10-CM

## 2018-10-03 DIAGNOSIS — K5901 Slow transit constipation: Secondary | ICD-10-CM

## 2018-10-03 DIAGNOSIS — R630 Anorexia: Secondary | ICD-10-CM

## 2018-10-03 DIAGNOSIS — I471 Supraventricular tachycardia: Secondary | ICD-10-CM

## 2018-10-03 NOTE — Progress Notes (Signed)
Location:  [facility] Room Number: 202/A Place of Service:  SNF (31) Provider:  Durenda Age, DNP, FNP-BC  Patient Care Team: [name], MD as PCP - General (Internal Medicine) Medina-Vargas, [name], NP as Nurse Practitioner (Internal Medicine)  Extended Emergency Contact Information Primary Emergency Contact: Hatfield,Lisa Address: [address]          [place], [place] [address] [place] Work Phone: [phone] Mobile Phone: [phone] Relation: Daughter  Code Status:  Full Code  Goals of care: Advanced Directive information Advanced Directives 10/03/2018  Does Patient Have a Medical Advance Directive? Yes  Type of Advance Directive (No Data)  Does patient want to make changes to medical advance directive? No - Patient declined  Would patient like information on creating a medical advance directive? -     Chief Complaint  Patient presents with  . Medical Management of Chronic Issues    Routine visit of medical management     HPI:  Pt is a 78 y.o. female seen today for medical management of chronic diseases. She has PMH of COPD, anemia, hypothyroidism, chronic pain syndrome and depression. She was seen today. She complained of constipation. She was concerned about her tremors and requested to follow up with neurology. She said that she has poor appetite and requested to have ensure. Latest weight 146.6 lbs Body mass index is 22.29 kg/m.  She currently takes Remeron at bedtime.   Past Medical History:  Diagnosis Date  . COPD (chronic obstructive pulmonary disease) ([place])   . History of anxiety   . History of depression   . Spinal stenosis   . UTI (urinary tract infection)    Past Surgical History:  Procedure Laterality Date  . ANKLE FRACTURE SURGERY Left   . CATARACT EXTRACTION, BILATERAL    . FLEXIBLE SIGMOIDOSCOPY Left 11/18/2015   Procedure: FLEXIBLE SIGMOIDOSCOPY;  Surgeon: [name], MD;  Location: [facility];  Service: Endoscopy;  Laterality: Left;  . FLEXIBLE SIGMOIDOSCOPY N/A 11/20/2015   Procedure: FLEXIBLE SIGMOIDOSCOPY;  Surgeon: [name], MD;  Location: [facility] ENDOSCOPY;  Service: Endoscopy;  Laterality: N/A;  . HIP ARTHROPLASTY Right 01/12/2016   Procedure: ARTHROPLASTY BIPOLAR HIP (HEMIARTHROPLASTY);  Surgeon: [name], MD;  Location: [facility];  Service: Orthopedics;  Laterality: Right;    Allergies  Allergen Reactions  . Biofreeze [Menthol (Topical Analgesic)] Rash    By history Aspercreme does not cause a rash    Outpatient Encounter Medications as of 10/03/2018  Medication Sig  . acetaminophen (TYLENOL) 325 MG tablet Take 650 mg by mouth every 6 (six) hours as needed.  Marland Kitchen aspirin 81 MG chewable tablet Chew 81 mg by mouth 2 (two) times daily.  . digoxin (LANOXIN) 0.125 MG tablet Take 0.125 mg by mouth daily. Hold if HR <60  . docusate sodium (COLACE) 100 MG capsule Take 1 capsule (100 mg total) by mouth 2 (two) times daily as needed for mild constipation.  . DULoxetine (CYMBALTA) 30 MG capsule Take 60 mg by mouth daily. for depression/pain   . flecainide (TAMBOCOR) 50 MG tablet Take 50 mg by mouth every 12 (twelve) hours.  . fluticasone (FLONASE ALLERGY RELIEF) 50 MCG/ACT nasal spray Place 1 spray into both nostrils daily.  Marland Kitchen gabapentin (NEURONTIN) 100 MG capsule Take 200 mg by mouth 3 (three) times daily. Take 2 tablets  . HYDROcodone-acetaminophen (NORCO/VICODIN) 5-325 MG tablet Give **1/2 tablet by mouth q 6 hrs PRN  . levalbuterol (XOPENEX) 1.25 MG/3ML nebulizer solution Take 1.25  mg by nebulization every 6 (six) hours as needed for wheezing.   Marland Kitchen levothyroxine (SYNTHROID, LEVOTHROID) 25 MCG tablet Take 25 mcg by mouth daily before breakfast.  . Lidocaine (ASPERCREME LIDOCAINE) 4 % PTCH APPLY 1 PATCH TRANSDERMALLY TO LOWER BACK AT BEDTIME FOR OSTEOARTHRITIS  . loratadine (CLARITIN) 10 MG tablet Take 10 mg by mouth at bedtime.  . mirtazapine (REMERON) 15 MG tablet Take 15 mg  by mouth at bedtime.   . Multiple Vitamins-Minerals (MULTIVITAMIN WITH MINERALS) tablet Take 1 tablet by mouth daily.  . naphazoline-glycerin (CLEAR EYES REDNESS RELIEF) 0.012-0.2 % SOLN Place 1 drop into both eyes 2 (two) times daily.  . ondansetron (ZOFRAN) 4 MG tablet Take 4 mg by mouth 2 (two) times daily.  . OXYGEN Inhale 2 L into the lungs at bedtime.  . pantoprazole (PROTONIX) 40 MG tablet Take 40 mg by mouth daily.  . polyethylene glycol (MIRALAX / GLYCOLAX) packet Take 17 g by mouth daily.  . primidone (MYSOLINE) 50 MG tablet Take 150 mg by mouth every morning.   . primidone (MYSOLINE) 50 MG tablet Take 100 mg by mouth at bedtime.   . sennosides-docusate sodium (SENOKOT-S) 8.6-50 MG tablet Take 2 tablets by mouth at bedtime.   . TRAZODONE HCL PO Take 12.5 mg by mouth 3 (three) times daily.  Marland Kitchen umeclidinium bromide (INCRUSE ELLIPTA) 62.5 MCG/INH AEPB Inhale 1 puff into the lungs daily. (SHAKE WELL)  . escitalopram (LEXAPRO) 20 MG tablet 10 mg. 0.5 mg qd x7 days and then discontinue  . [DISCONTINUED] HYDROcodone-acetaminophen (NORCO/VICODIN) 5-325 MG tablet Take 0.5 tablets by mouth every 6 (six) hours as needed for moderate pain. Give **1/2 tablet by mouth q 6 hrs PRN   No facility-administered encounter medications on file as of 10/03/2018.     Review of Systems  GENERAL:  +poor appetite MOUTH and THROAT: Denies oral discomfort, gingival pain or bleeding RESPIRATORY: no cough, SOB, DOE, wheezing, hemoptysis CARDIAC: No chest pain, edema or palpitations GI: +constipation GU: Denies dysuria, frequency, hematuria, incontinence, or discharge NEUROLOGICAL: Denies dizziness, syncope, numbness, or headache PSYCHIATRIC: Denies feelings of depression or anxiety. No report of hallucinations, insomnia, paranoia, or agitation   Immunization History  Administered Date(s) Administered  . Influenza, High Dose Seasonal PF 10/04/2016  . Influenza,inj,Quad PF,6+ Mos 01/13/2016  .  Influenza-Unspecified 11/10/2017  . Pneumococcal-Unspecified 10/11/2011  . Tdap 01/23/2016   Pertinent  Health Maintenance Due  Topic Date Due  . INFLUENZA VACCINE  11/02/2018 (Originally 08/11/2018)  . PNA vac Low Risk Adult  Completed  . DEXA SCAN  Discontinued   Fall Risk  07/19/2018 01/17/2018 11/09/2017 07/14/2017 04/10/2017  Falls in the past year? 0 0 No No Yes  Number falls in past yr: 0 0 - - 2 or more  Injury with Fall? 0 0 - - Yes  Comment - - - - -  Risk Factor Category  - - - - High Fall Risk  Follow up - Falls evaluation completed - - Falls evaluation completed     Vitals:   10/03/18 1038  BP: 132/79  Pulse: 63  Resp: 20  Temp: 97.6 F (36.4 C)  TempSrc: Oral  SpO2: 98%  Weight: 146 lb 9.6 oz (66.5 kg)  Height: 5\' 8"  (1.727 m)   Body mass index is 22.29 kg/m.  Physical Exam  GENERAL APPEARANCE: Well nourished. In no acute distress. Normal body habitus SKIN:  Skin is warm and dry.  MOUTH and THROAT: Lips are without lesions. Oral mucosa is moist and  without lesions. Tongue is normal in shape, size, and color and without lesions RESPIRATORY: Breathing is even & unlabored, BS CTAB CARDIAC: Iregular heart rate, no murmur,no extra heart sounds, no edema GI: Abdomen soft, normal BS, no masses, no tenderness EXTREMITIES:  Able to move X 4 extremities NEUROLOGICAL: There is no tremor. Speech is clear. Minimal tremors. Alert and oriented X 3. PSYCHIATRIC:  Affect and behavior are appropriate  Labs reviewed: Recent Labs    12/05/17 03/02/18  NA 141 141  K 5.1 4.5  CL  --  102  CO2  --  29  BUN 16 11  CREATININE 1.1 1.0  CALCIUM  --  8.2   Recent Labs    12/05/17  AST 12*  ALT 8  ALKPHOS 97   Recent Labs    12/05/17 01/24/18 01/29/18 07/17/18  WBC 6.2 8.2 9.0 5.0  NEUTROABS 4 7 7   --   HGB 10.5* 9.7* 10.4* 10.8*  HCT 31* 29* 30* 31*  PLT 213 294 358 223   Lab Results  Component Value Date   TSH 4.17 03/02/2018   Lab Results  Component Value Date    HGBA1C 5.2 12/01/2015   Lab Results  Component Value Date   CHOL 107 12/01/2015   HDL 27 (L) 12/01/2015   LDLCALC 59 12/01/2015   TRIG 107 12/01/2015   CHOLHDL 4.0 12/01/2015     Assessment/Plan  1. Tremor -Continue primidone, follow-up with neurology virtually  2. Slow transit constipation -Will start senna S 8.6-50 mg 1 tab twice daily, continue MiraLAX 17 g as needed  3. Poor appetite - will start on Ensure 237 mL daily, continue Remeron  4. SVT (supraventricular tachycardia) (HCC) -Rate controlled, continue flecainide, digoxin and aspirin  5. Simple chronic bronchitis (HCC) -No SOB nor wheezing , uses oxygen as needed during the day and continuously at night (2L), continue levalbuterol neb as needed and Incruse   Family/ staff Communication:  Discussed plan of care with resident.  Labs/tests ordered: None  Goals of care:   Long-term care   Durenda Age, DNP, FNP-BC [facility] and Adult Medicine [phone] (Monday-Friday 8:00 a.m. - 5:00 p.m.) [phone] (after hours)

## 2018-10-05 DIAGNOSIS — R630 Anorexia: Secondary | ICD-10-CM | POA: Insufficient documentation

## 2018-10-05 DIAGNOSIS — K5900 Constipation, unspecified: Secondary | ICD-10-CM | POA: Insufficient documentation

## 2018-10-08 DIAGNOSIS — R630 Anorexia: Secondary | ICD-10-CM | POA: Diagnosis not present

## 2018-10-08 DIAGNOSIS — G4700 Insomnia, unspecified: Secondary | ICD-10-CM | POA: Diagnosis not present

## 2018-10-08 DIAGNOSIS — F331 Major depressive disorder, recurrent, moderate: Secondary | ICD-10-CM | POA: Diagnosis not present

## 2018-10-08 DIAGNOSIS — F419 Anxiety disorder, unspecified: Secondary | ICD-10-CM | POA: Diagnosis not present

## 2018-10-22 NOTE — Progress Notes (Signed)
Virtual Visit via Video Note The purpose of this virtual visit is to provide medical care while limiting exposure to the novel coronavirus.    Consent was obtained for video visit:  Yes.   Answered questions that patient had about telehealth interaction:  Yes.   I discussed the limitations, risks, security and privacy concerns of performing an evaluation and management service by telemedicine. I also discussed with the patient that there may be a patient responsible charge related to this service. The patient expressed understanding and agreed to proceed.  Pt location: Home Physician Location: home Name of referring provider:  [name], MD I connected with Arnaldo Natal Estelle at patients initiation/request on 10/24/2018 at 11:15 AM EDT by video enabled telemedicine application and verified that I am speaking with the correct person using two identifiers. Pt MRN:  [id number] Pt DOB:  Aug 10, 1940 Video Participants:  Arnaldo Natal Arpino;     History of Present Illness:   Pt seen in f/u for tremor.  Primidone was increased last visit so that she is now taking 150 mg in the morning and 100 mg at night.  The records that were made available to me were reviewed.  Still on inhalers for COPD.  Records made available to me are reviewed.  She was told to follow-up here for continued tremor.  Last visit, I told her to try weighted gloves from [employer].  She reports that she did not try those.  She does not think that primidone is really helpful.  She has difficulty eating.  Her head tremor is bothersome.  Other meds:  Gabapentin; not beta blocker candidate due to copd   Current Outpatient Medications on File Prior to Visit  Medication Sig Dispense Refill   acetaminophen (TYLENOL) 325 MG tablet Take 650 mg by mouth every 6 (six) hours as needed.     alum & mag hydroxide-simeth (MAALOX PLUS) 400-400-40 MG/5ML suspension Take 30 mLs by mouth every 2 (two) hours as needed for indigestion.      aspirin 81 MG chewable tablet Chew 81 mg by mouth 2 (two) times daily.     digoxin (LANOXIN) 0.125 MG tablet Take 0.125 mg by mouth daily. Hold if HR <60     docusate sodium (COLACE) 100 MG capsule Take 1 capsule (100 mg total) by mouth 2 (two) times daily as needed for mild constipation. 10 capsule 0   DULoxetine (CYMBALTA) 30 MG capsule Take 60 mg by mouth daily. for depression/pain      flecainide (TAMBOCOR) 50 MG tablet Take 50 mg by mouth every 12 (twelve) hours.     fluticasone (FLONASE ALLERGY RELIEF) 50 MCG/ACT nasal spray Place 1 spray into both nostrils daily.     gabapentin (NEURONTIN) 100 MG capsule Take 200 mg by mouth 3 (three) times daily. Take 2 tablets     HYDROcodone-acetaminophen (NORCO/VICODIN) 5-325 MG tablet Give **1/2 tablet by mouth q 6 hrs PRN     levalbuterol (XOPENEX) 1.25 MG/3ML nebulizer solution Take 1.25 mg by nebulization 2 (two) times daily.      levothyroxine (SYNTHROID, LEVOTHROID) 25 MCG tablet Take 25 mcg by mouth daily before breakfast.     Lidocaine (ASPERCREME LIDOCAINE) 4 % PTCH APPLY 1 PATCH TRANSDERMALLY TO LOWER BACK AT BEDTIME FOR OSTEOARTHRITIS     loratadine (CLARITIN) 10 MG tablet Take 10 mg by mouth at bedtime.     mirtazapine (REMERON) 15 MG tablet Take 15 mg by mouth at bedtime.      Multiple Vitamins-Minerals (  MULTIVITAMIN WITH MINERALS) tablet Take 1 tablet by mouth daily.     naphazoline-glycerin (CLEAR EYES REDNESS RELIEF) 0.012-0.2 % SOLN Place 1 drop into both eyes 2 (two) times daily.     Nutritional Supplements (ENSURE PO) Take by mouth. ENSURE LIQUID Give 237 ml po daily     ondansetron (ZOFRAN) 4 MG tablet Take 4 mg by mouth 2 (two) times daily.     OXYGEN Inhale 2 L into the lungs at bedtime.     pantoprazole (PROTONIX) 40 MG tablet Take 40 mg by mouth daily.     polyethylene glycol (MIRALAX / GLYCOLAX) packet Take 17 g by mouth daily.     primidone (MYSOLINE) 50 MG tablet Take 150 mg by mouth every morning.       primidone (MYSOLINE) 50 MG tablet Take 100 mg by mouth at bedtime.      sennosides-docusate sodium (SENOKOT-S) 8.6-50 MG tablet Take 1 tablet by mouth 2 (two) times daily.      TRAZODONE HCL PO Take 12.5 mg by mouth 3 (three) times daily.     umeclidinium bromide (INCRUSE ELLIPTA) 62.5 MCG/INH AEPB Inhale 1 puff into the lungs daily. (SHAKE WELL)     escitalopram (LEXAPRO) 20 MG tablet 10 mg. 0.5 mg qd x7 days and then discontinue     No current facility-administered medications on file prior to visit.      Observations/Objective:   Vitals:   GEN:  The patient appears stated age and is in NAD.  Neurological examination:  Orientation: The patient is alert and oriented x3. Cranial nerves: There is good facial symmetry. There is no facial hypomimia.  The speech is fluent and clear. Soft palate rises symmetrically and there is no tongue deviation. Hearing is intact to conversational tone. Motor: Strength is at least antigravity x 4.   Shoulder shrug is equal and symmetric.  There is no pronator drift.  Movement examination: Tone: unable Abnormal movements: there is head tremor, mostly in the "no" direction.  There is bilateral UE postural tremor, at least moderate.  The longer she holds a postion the worse the tremor is.  She has mild trouble with archimedes spirals, L more than right. Coordination:  There is no decremation with RAM's with hand opening and closing, finger taps. Gait and Station: Did not walk the patient.  She is in a wheelchair.  She does state that she usually ambulates with a walker.    Assessment and Plan:   1.  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  Discussed with her that we would not get rid of her tremor without surgery.  I do not think she would be a surgery candidate given her COPD and advancing age.  She is not interested in DBS surgery anyhow.  Discussed that medications could only be expected to decrease amplitude  and frequency of tremor, and likely will have any effect on head tremor.  -She is already on primidone, 50 mg, 3 tablets in the morning and 2 tablets at night.  She is not sure that is helpful.  She is not a beta-blocker candidate because of her COPD.  -Discussed with the second line medications.  Many of them are second line because of their lack of efficacy.  However, the ones that are effective, have anticholinergic properties.  This is obviously worrisome in this age group.  Discussed trihexyphenidyl, which can cause confusion, hallucinations, falls in this age group.  She very much wants  to try it.  She states that her quality of life is not very good.  I talked to her about my worries with this.  She asked me if she can try it for a month or so.  I told her that we would try 2 mg in the morning with breakfast and if she had any side effects, she is to call me or have the nursing facility call me and we will discontinue it immediately.  We talked extensively about risks, benefits, and side effects and the patient expressed clear understanding.  She was of clear and sound mind.  We discussed dry eyes and dry mouth as well.  -I still told her to try to get the weighted gloves/weighted spoons.    Follow Up Instructions: 4-6 weeks.  -I discussed the assessment and treatment plan with the patient. The patient was provided an opportunity to ask questions and all were answered. The patient agreed with the plan and demonstrated an understanding of the instructions.   The patient was advised to call back or seek an in-person evaluation if the symptoms worsen or if the condition fails to improve as anticipated.    Total Time spent in visit with the patient was:  25 min, of which more than 50% of the time was spent in counseling.   Pt understands and agrees with the plan of care outlined.     Dawn Bogus, DO

## 2018-10-23 ENCOUNTER — Non-Acute Institutional Stay (SKILLED_NURSING_FACILITY): Payer: Medicare Other | Admitting: Adult Health

## 2018-10-23 ENCOUNTER — Encounter: Payer: Self-pay | Admitting: Adult Health

## 2018-10-23 DIAGNOSIS — J069 Acute upper respiratory infection, unspecified: Secondary | ICD-10-CM | POA: Diagnosis not present

## 2018-10-23 DIAGNOSIS — G629 Polyneuropathy, unspecified: Secondary | ICD-10-CM

## 2018-10-23 DIAGNOSIS — R251 Tremor, unspecified: Secondary | ICD-10-CM | POA: Diagnosis not present

## 2018-10-23 DIAGNOSIS — J410 Simple chronic bronchitis: Secondary | ICD-10-CM | POA: Diagnosis not present

## 2018-10-23 DIAGNOSIS — E034 Atrophy of thyroid (acquired): Secondary | ICD-10-CM

## 2018-10-23 DIAGNOSIS — G894 Chronic pain syndrome: Secondary | ICD-10-CM | POA: Diagnosis not present

## 2018-10-23 DIAGNOSIS — I471 Supraventricular tachycardia: Secondary | ICD-10-CM

## 2018-10-23 NOTE — Progress Notes (Signed)
Location:  [facility] Room Number: 202/A Place of Service:  SNF (31) Provider:  Durenda Age, DNP, FNP-BC  Patient Care Team: [name], MD as PCP - General (Internal Medicine) Medina-Vargas, [name], NP as Nurse Practitioner (Internal Medicine)  Extended Emergency Contact Information Primary Emergency Contact: Hatfield,Lisa Address: [address]          [place], [place] [address] [place] Work Phone: [phone] Mobile Phone: [phone] Relation: Daughter  Code Status:  Full Code  Goals of care: Advanced Directive information Advanced Directives 10/23/2018  Does Patient Have a Medical Advance Directive? Yes  Type of Advance Directive (No Data)  Does patient want to make changes to medical advance directive? -  Would patient like information on creating a medical advance directive? -     Chief Complaint  Patient presents with  . Medical Management of Chronic Issues    Routine visit of medical management     HPI:  Pt is a 78 y.o. female seen today for medical management of chronic diseases.  She has PMH of COPD, anemia, hypothyroidism, chronic pain syndrome and depression.  She was seen today in her room.  She complains of pain on bilateral ankles and hips. She takes Norco PRN and Acetaminophen PRN for pain. No fall incident has been reported. Additional Levalbuterol nebulization BID was recently ordered per pharmacy recommendation upon [facility] review with noted frequent request for nebulization treatment. No noted SOB nor wheezing.  She denies having heartburns.  Noted to have mild tremors on her lower lip.  She currently takes primidone for tremors.   Past Medical History:  Diagnosis Date  . COPD (chronic obstructive pulmonary disease) ([place])   . History of anxiety   . History of depression   . Spinal stenosis   . UTI (urinary tract infection)    Past Surgical History:  Procedure Laterality Date  . ANKLE FRACTURE  SURGERY Left   . CATARACT EXTRACTION, BILATERAL    . FLEXIBLE SIGMOIDOSCOPY Left 11/18/2015   Procedure: FLEXIBLE SIGMOIDOSCOPY;  Surgeon: [name], MD;  Location: [facility];  Service: Endoscopy;  Laterality: Left;  . FLEXIBLE SIGMOIDOSCOPY N/A 11/20/2015   Procedure: FLEXIBLE SIGMOIDOSCOPY;  Surgeon: [name], MD;  Location: [facility] ENDOSCOPY;  Service: Endoscopy;  Laterality: N/A;  . HIP ARTHROPLASTY Right 01/12/2016   Procedure: ARTHROPLASTY BIPOLAR HIP (HEMIARTHROPLASTY);  Surgeon: [name], MD;  Location: [facility];  Service: Orthopedics;  Laterality: Right;    Allergies  Allergen Reactions  . Biofreeze [Menthol (Topical Analgesic)] Rash    By history Aspercreme does not cause a rash    Outpatient Encounter Medications as of 10/23/2018  Medication Sig  . acetaminophen (TYLENOL) 325 MG tablet Take 650 mg by mouth every 6 (six) hours as needed.  Marland Kitchen alum & mag hydroxide-simeth (MAALOX PLUS) 400-400-40 MG/5ML suspension Take 30 mLs by mouth every 2 (two) hours as needed for indigestion.  Marland Kitchen aspirin 81 MG chewable tablet Chew 81 mg by mouth 2 (two) times daily.  . digoxin (LANOXIN) 0.125 MG tablet Take 0.125 mg by mouth daily. Hold if HR <60  . docusate sodium (COLACE) 100 MG capsule Take 1 capsule (100 mg total) by mouth 2 (two) times daily as needed for mild constipation.  . DULoxetine (CYMBALTA) 30 MG capsule Take 60 mg by mouth daily. for depression/pain   . flecainide (TAMBOCOR) 50 MG tablet Take 50 mg by mouth every 12 (twelve) hours.  . fluticasone (FLONASE ALLERGY RELIEF) 50 MCG/ACT nasal spray  Place 1 spray into both nostrils daily.  Marland Kitchen gabapentin (NEURONTIN) 100 MG capsule Take 200 mg by mouth 3 (three) times daily. Take 2 tablets  . HYDROcodone-acetaminophen (NORCO/VICODIN) 5-325 MG tablet Give **1/2 tablet by mouth q 6 hrs PRN  . levalbuterol (XOPENEX) 1.25 MG/3ML nebulizer solution Take 1.25 mg by nebulization 2 (two) times daily.   Marland Kitchen levothyroxine (SYNTHROID, LEVOTHROID) 25  MCG tablet Take 25 mcg by mouth daily before breakfast.  . Lidocaine (ASPERCREME LIDOCAINE) 4 % PTCH APPLY 1 PATCH TRANSDERMALLY TO LOWER BACK AT BEDTIME FOR OSTEOARTHRITIS  . loratadine (CLARITIN) 10 MG tablet Take 10 mg by mouth at bedtime.  . mirtazapine (REMERON) 15 MG tablet Take 15 mg by mouth at bedtime.   . Multiple Vitamins-Minerals (MULTIVITAMIN WITH MINERALS) tablet Take 1 tablet by mouth daily.  . naphazoline-glycerin (CLEAR EYES REDNESS RELIEF) 0.012-0.2 % SOLN Place 1 drop into both eyes 2 (two) times daily.  . Nutritional Supplements (ENSURE PO) Take by mouth. ENSURE LIQUID Give 237 ml po daily  . ondansetron (ZOFRAN) 4 MG tablet Take 4 mg by mouth 2 (two) times daily.  . OXYGEN Inhale 2 L into the lungs at bedtime.  . pantoprazole (PROTONIX) 40 MG tablet Take 40 mg by mouth daily.  . polyethylene glycol (MIRALAX / GLYCOLAX) packet Take 17 g by mouth daily.  . primidone (MYSOLINE) 50 MG tablet Take 150 mg by mouth every morning.   . primidone (MYSOLINE) 50 MG tablet Take 100 mg by mouth at bedtime.   . sennosides-docusate sodium (SENOKOT-S) 8.6-50 MG tablet Take 1 tablet by mouth 2 (two) times daily.   . TRAZODONE HCL PO Take 12.5 mg by mouth 3 (three) times daily.  Marland Kitchen umeclidinium bromide (INCRUSE ELLIPTA) 62.5 MCG/INH AEPB Inhale 1 puff into the lungs daily. (SHAKE WELL)  . escitalopram (LEXAPRO) 20 MG tablet 10 mg. 0.5 mg qd x7 days and then discontinue   No facility-administered encounter medications on file as of 10/23/2018.     Review of Systems  GENERAL: No change in appetite, no fatigue, no weight changes, no fever, chills or weakness MOUTH and THROAT: Denies oral discomfort, gingival pain or bleeding RESPIRATORY: no cough, SOB, DOE, wheezing, hemoptysis CARDIAC: No chest pain, edema or palpitations GI: No abdominal pain, diarrhea, constipation, heart burn, nausea or vomiting GU: Denies dysuria, frequency, hematuria, incontinence, or discharge NEUROLOGICAL: Denies  dizziness, syncope, numbness, or headache PSYCHIATRIC: Denies feelings of depression or anxiety. No report of hallucinations, insomnia, paranoia, or agitation   Immunization History  Administered Date(s) Administered  . Influenza, High Dose Seasonal PF 10/04/2016  . Influenza,inj,Quad PF,6+ Mos 01/13/2016  . Influenza-Unspecified 11/10/2017, 10/09/2018  . Pneumococcal-Unspecified 10/11/2011  . Tdap 01/23/2016   Pertinent  Health Maintenance Due  Topic Date Due  . INFLUENZA VACCINE  Completed  . PNA vac Low Risk Adult  Completed  . DEXA SCAN  Discontinued   Fall Risk  07/19/2018 01/17/2018 11/09/2017 07/14/2017 04/10/2017  Falls in the past year? 0 0 No No Yes  Number falls in past yr: 0 0 - - 2 or more  Injury with Fall? 0 0 - - Yes  Comment - - - - -  Risk Factor Category  - - - - High Fall Risk  Follow up - Falls evaluation completed - - Falls evaluation completed     Vitals:   10/23/18 1319  BP: 98/69  Pulse: 76  Resp: 17  Temp: (!) 97.2 F (36.2 C)  TempSrc: Oral  SpO2: 94%  Weight:  145 lb 9.6 oz (66 kg)  Height: 5\' 8"  (1.727 m)   Body mass index is 22.14 kg/m.  Physical Exam  GENERAL APPEARANCE: Well nourished. In no acute distress. Normal body habitus SKIN:  Skin is warm and dry.  MOUTH and THROAT: Lips are without lesions. Oral mucosa is moist and without lesions. Tongue is normal in shape, size, and color and without lesions RESPIRATORY: Breathing is even & unlabored, BS CTAB CARDIAC: RRR, no murmur,no extra heart sounds, no edema GI: Abdomen soft, normal BS, no masses, no tenderness EXTREMITIES:  Able to move X 4 extremities NEUROLOGICAL: + tremor. Speech is clear. Alert and oriented X 3. PSYCHIATRIC:  Affect and behavior are appropriate  Labs reviewed: Recent Labs    12/05/17 03/02/18  NA 141 141  K 5.1 4.5  CL  --  102  CO2  --  29  BUN 16 11  CREATININE 1.1 1.0  CALCIUM  --  8.2   Recent Labs    12/05/17  AST 12*  ALT 8  ALKPHOS 97   Recent  Labs    12/05/17 01/24/18 01/29/18 07/17/18  WBC 6.2 8.2 9.0 5.0  NEUTROABS 4 7 7   --   HGB 10.5* 9.7* 10.4* 10.8*  HCT 31* 29* 30* 31*  PLT 213 294 358 223   Lab Results  Component Value Date   TSH 4.17 03/02/2018   Lab Results  Component Value Date   HGBA1C 5.2 12/01/2015   Lab Results  Component Value Date   CHOL 107 12/01/2015   HDL 27 (L) 12/01/2015   LDLCALC 59 12/01/2015   TRIG 107 12/01/2015   CHOLHDL 4.0 12/01/2015     Assessment/Plan  1. Simple chronic bronchitis (HCC) -Recently added levalbuterol nebulization twice a day in addition to PRN levalbuterol, continue Incruse and O2 @ 2L/min via [facility]  2. Hypothyroidism due to acquired atrophy of thyroid Lab Results  Component Value Date   TSH 4.17 03/02/2018  -Continue levothyroxine  3.  Tremor -Continue primidone, follow-up with neurology  4. Polyneuropathy -Continue gabapentin, Norco PRN and acetaminophen as needed  5. Chronic pain syndrome -Continue Norco PRN and acetaminophen as needed  6. SVT (supraventricular tachycardia) (HCC) Heart rate well controlled, continue flecainide and digoxin    Family/ staff Communication: Discussed plan of care with resident.  Labs/tests ordered: None  Goals of care:   Long-term care   Durenda Age, DNP, FNP-BC [facility] [phone] (Monday-Friday 8:00 a.m. - 5:00 p.m.) [phone] (after hours)

## 2018-10-24 ENCOUNTER — Other Ambulatory Visit: Payer: Self-pay

## 2018-10-24 ENCOUNTER — Telehealth (INDEPENDENT_AMBULATORY_CARE_PROVIDER_SITE_OTHER): Payer: Medicare Other | Admitting: Neurology

## 2018-10-24 ENCOUNTER — Encounter: Payer: Self-pay | Admitting: Neurology

## 2018-10-24 DIAGNOSIS — G250 Essential tremor: Secondary | ICD-10-CM

## 2018-10-24 MED ORDER — TRIHEXYPHENIDYL HCL 2 MG PO TABS: 2.0000 mg | ORAL_TABLET | Freq: Every morning | ORAL | 0 refills | Status: DC

## 2018-10-24 MED ORDER — HYDROCODONE-ACETAMINOPHEN 5-325 MG PO TABS: ORAL_TABLET | ORAL | 0 refills | Status: DC

## 2018-10-30 DIAGNOSIS — J069 Acute upper respiratory infection, unspecified: Secondary | ICD-10-CM | POA: Diagnosis not present

## 2018-11-01 ENCOUNTER — Encounter: Payer: Self-pay | Admitting: Adult Health

## 2018-11-01 ENCOUNTER — Non-Acute Institutional Stay (SKILLED_NURSING_FACILITY): Payer: Medicare Other | Admitting: Adult Health

## 2018-11-01 DIAGNOSIS — G629 Polyneuropathy, unspecified: Secondary | ICD-10-CM

## 2018-11-01 DIAGNOSIS — R400 Somnolence: Secondary | ICD-10-CM | POA: Diagnosis not present

## 2018-11-01 DIAGNOSIS — M8949 Other hypertrophic osteoarthropathy, multiple sites: Secondary | ICD-10-CM | POA: Diagnosis not present

## 2018-11-01 DIAGNOSIS — R630 Anorexia: Secondary | ICD-10-CM

## 2018-11-01 DIAGNOSIS — M159 Polyosteoarthritis, unspecified: Secondary | ICD-10-CM

## 2018-11-01 DIAGNOSIS — M1990 Unspecified osteoarthritis, unspecified site: Secondary | ICD-10-CM | POA: Insufficient documentation

## 2018-11-01 MED ORDER — MIRTAZAPINE 7.5 MG PO TABS: 7.5000 mg | ORAL_TABLET | Freq: Every day | ORAL | 0 refills | Status: DC

## 2018-11-01 MED ORDER — TRAZODONE HCL 50 MG PO TABS: ORAL_TABLET | ORAL | 3 refills | Status: DC

## 2018-11-01 MED ORDER — GABAPENTIN 100 MG PO CAPS: ORAL_CAPSULE | ORAL | 3 refills | Status: DC

## 2018-11-01 MED ORDER — MELOXICAM 7.5 MG PO TABS: 7.5000 mg | ORAL_TABLET | Freq: Two times a day (BID) | ORAL | 0 refills | Status: AC

## 2018-11-01 NOTE — Progress Notes (Signed)
Location:  [facility] Room Number: 202 A Place of Service:  SNF (31) Provider:  Durenda Age, DNP, FNP-BC  Patient Care Team: [name], MD as PCP - General (Internal Medicine) Medina-Vargas, [name], NP as Nurse Practitioner (Internal Medicine)  Extended Emergency Contact Information Primary Emergency Contact: Hatfield,Lisa Address: [address]          [place], [place] [address] [place] Work Phone: [phone] Mobile Phone: [phone] Relation: Daughter  Code Status:  Full Code  Goals of care: Advanced Directive information Advanced Directives 10/24/2018  Does Patient Have a Medical Advance Directive? Yes  Type of Advance Directive -  Does patient want to make changes to medical advance directive? -  Would patient like information on creating a medical advance directive? -     Chief Complaint  Patient presents with  . Acute Visit    Lower back and bilateral hip pain, sleepy    HPI:  Pt is a 78 y.o. female seen for complaints of bilateral hips and lower back pain, 8/10. She was requesting for Meloxicam. However, Meloxicam interacts with Remeron and Trazodone. She verbalized that she is sleepy during the day. She was recently started on Trihexyphenidyl by Dr. Carles Collet, neurologist, for her tremors. She reported havin good appetite now. Latest weight is 145.6 lbs Body mass index is 22.14 kg/m.    Past Medical History:  Diagnosis Date  . COPD (chronic obstructive pulmonary disease) ([place])   . History of anxiety   . History of depression   . Spinal stenosis   . UTI (urinary tract infection)    Past Surgical History:  Procedure Laterality Date  . ANKLE FRACTURE SURGERY Left   . CATARACT EXTRACTION, BILATERAL    . FLEXIBLE SIGMOIDOSCOPY Left 11/18/2015   Procedure: FLEXIBLE SIGMOIDOSCOPY;  Surgeon: [name], MD;  Location: [facility];  Service: Endoscopy;  Laterality: Left;  . FLEXIBLE SIGMOIDOSCOPY N/A 11/20/2015    Procedure: FLEXIBLE SIGMOIDOSCOPY;  Surgeon: [name], MD;  Location: [facility] ENDOSCOPY;  Service: Endoscopy;  Laterality: N/A;  . HIP ARTHROPLASTY Right 01/12/2016   Procedure: ARTHROPLASTY BIPOLAR HIP (HEMIARTHROPLASTY);  Surgeon: [name], MD;  Location: [facility];  Service: Orthopedics;  Laterality: Right;    Allergies  Allergen Reactions  . Biofreeze [Menthol (Topical Analgesic)] Rash    By history Aspercreme does not cause a rash    Outpatient Encounter Medications as of 11/01/2018  Medication Sig  . acetaminophen (TYLENOL) 325 MG tablet Take 650 mg by mouth every 6 (six) hours as needed.  Marland Kitchen alum & mag hydroxide-simeth (MAALOX PLUS) 400-400-40 MG/5ML suspension Take 30 mLs by mouth every 2 (two) hours as needed for indigestion.  Marland Kitchen aspirin 81 MG chewable tablet Chew 81 mg by mouth 2 (two) times daily.  . digoxin (LANOXIN) 0.125 MG tablet Take 0.125 mg by mouth daily. Hold if HR <60  . docusate sodium (COLACE) 100 MG capsule Take 1 capsule (100 mg total) by mouth 2 (two) times daily as needed for mild constipation.  . DULoxetine (CYMBALTA) 30 MG capsule Take 60 mg by mouth daily. for depression/pain   . flecainide (TAMBOCOR) 50 MG tablet Take 50 mg by mouth every 12 (twelve) hours.  . fluticasone (FLONASE ALLERGY RELIEF) 50 MCG/ACT nasal spray Place 1 spray into both nostrils daily.  Marland Kitchen HYDROcodone-acetaminophen (NORCO/VICODIN) 5-325 MG tablet Give **1/2 tablet by mouth q 6 hrs PRN  . levalbuterol (XOPENEX) 1.25 MG/3ML nebulizer solution Take 1.25 mg by nebulization 2 (two) times daily.   Marland Kitchen  levothyroxine (SYNTHROID, LEVOTHROID) 25 MCG tablet Take 25 mcg by mouth daily before breakfast.  . Lidocaine (ASPERCREME LIDOCAINE) 4 % PTCH APPLY 1 PATCH TRANSDERMALLY TO LOWER BACK AT BEDTIME FOR OSTEOARTHRITIS  . loratadine (CLARITIN) 10 MG tablet Take 10 mg by mouth at bedtime.  . Multiple Vitamins-Minerals (MULTIVITAMIN WITH MINERALS) tablet Take 1 tablet by mouth daily.  . naphazoline-glycerin  (CLEAR EYES REDNESS RELIEF) 0.012-0.2 % SOLN Place 1 drop into both eyes 2 (two) times daily.  . Nutritional Supplements (ENSURE PO) Take by mouth. ENSURE LIQUID Give 237 ml po daily  . ondansetron (ZOFRAN) 4 MG tablet Take 4 mg by mouth 2 (two) times daily.  . OXYGEN Inhale 2 L into the lungs at bedtime.  . pantoprazole (PROTONIX) 40 MG tablet Take 40 mg by mouth daily.  . polyethylene glycol (MIRALAX / GLYCOLAX) packet Take 17 g by mouth daily.  . primidone (MYSOLINE) 50 MG tablet Take 150 mg by mouth every morning.   . primidone (MYSOLINE) 50 MG tablet Take 100 mg by mouth at bedtime.   . sennosides-docusate sodium (SENOKOT-S) 8.6-50 MG tablet Take 1 tablet by mouth 2 (two) times daily.   . trihexyphenidyl (ARTANE) 2 MG tablet Take 1 tablet (2 mg total) by mouth every morning.  . umeclidinium bromide (INCRUSE ELLIPTA) 62.5 MCG/INH AEPB Inhale 1 puff into the lungs daily. (SHAKE WELL)  . [DISCONTINUED] escitalopram (LEXAPRO) 20 MG tablet 10 mg. 0.5 mg qd x7 days and then discontinue  . [DISCONTINUED] gabapentin (NEURONTIN) 100 MG capsule Take 200 mg by mouth 3 (three) times daily. Take 2 tablets  . [DISCONTINUED] mirtazapine (REMERON) 15 MG tablet Take 15 mg by mouth at bedtime.   . [DISCONTINUED] TRAZODONE HCL PO Take 12.5 mg by mouth 3 (three) times daily.   No facility-administered encounter medications on file as of 11/01/2018.     Review of Systems  GENERAL: No change in appetite, no fatigue, no weight changes, no fever, chills or weakness MOUTH and THROAT: Denies oral discomfort, gingival pain or bleeding  RESPIRATORY: no cough, DOE, wheezing, hemoptysis CARDIAC: No chest pain, edema or palpitations GI: No abdominal pain, diarrhea, constipation, heart burn, nausea or vomiting GU: Denies dysuria, frequency, hematuria, incontinence, or discharge NEUROLOGICAL: Denies dizziness, syncope, numbness, or headache PSYCHIATRIC: Denies feelings of depression or anxiety. No report of  hallucinations, insomnia, paranoia, or agitation   Immunization History  Administered Date(s) Administered  . Influenza, High Dose Seasonal PF 10/04/2016  . Influenza,inj,Quad PF,6+ Mos 01/13/2016  . Influenza-Unspecified 11/10/2017, 10/09/2018  . Pneumococcal-Unspecified 10/11/2011  . Tdap 01/23/2016   Pertinent  Health Maintenance Due  Topic Date Due  . INFLUENZA VACCINE  Completed  . PNA vac Low Risk Adult  Completed  . DEXA SCAN  Discontinued   Fall Risk  10/24/2018 07/19/2018 01/17/2018 11/09/2017 07/14/2017  Falls in the past year? 0 0 0 No No  Number falls in past yr: 0 0 0 - -  Injury with Fall? 0 0 0 - -  Comment - - - - -  Risk Factor Category  - - - - -  Follow up - - Falls evaluation completed - -     Vitals:   11/01/18 1614  BP: 129/70  Pulse: 68  Resp: 18  Temp: (!) 97.1 F (36.2 C)  Weight: 145 lb 9.6 oz (66 kg)  Height: 5\' 8"  (1.727 m)   Body mass index is 22.14 kg/m.  Physical Exam  GENERAL APPEARANCE: Well nourished. In no acute distress. Normal body  habitus SKIN:  Skin is warm and dry.  MOUTH and THROAT: Lips are without lesions. Oral mucosa is moist and without lesions. Tongue is normal in shape, size, and color and without lesions RESPIRATORY: Breathing is even & unlabored, BS CTAB CARDIAC: RRR, no murmur,no extra heart sounds, no edema GI: Abdomen soft, normal BS, no masses, no tenderness EXTREMITIES:  Able to move X 4 extremities NEUROLOGICAL: + tremor. Speech is clear. Alert and oriented X 3. PSYCHIATRIC:  Affect and behavior are appropriate  Labs reviewed: Recent Labs    12/05/17 03/02/18  NA 141 141  K 5.1 4.5  CL  --  102  CO2  --  29  BUN 16 11  CREATININE 1.1 1.0  CALCIUM  --  8.2   Recent Labs    12/05/17  AST 12*  ALT 8  ALKPHOS 97   Recent Labs    12/05/17 01/24/18 01/29/18 07/17/18  WBC 6.2 8.2 9.0 5.0  NEUTROABS 4 7 7   --   HGB 10.5* 9.7* 10.4* 10.8*  HCT 31* 29* 30* 31*  PLT 213 294 358 223   Lab Results   Component Value Date   TSH 4.17 03/02/2018   Lab Results  Component Value Date   HGBA1C 5.2 12/01/2015   Lab Results  Component Value Date   CHOL 107 12/01/2015   HDL 27 (L) 12/01/2015   LDLCALC 59 12/01/2015   TRIG 107 12/01/2015   CHOLHDL 4.0 12/01/2015     Assessment/Plan  1. Primary osteoarthritis involving multiple joints - meloxicam (MOBIC) 7.5 MG tablet; Take 1 tablet (7.5 mg total) by mouth 2 (two) times daily for 10 days.  Dispense: 20 tablet; Refill: 0 - will re-assess in 10 days  2. Somnolence, daytime - will taper off Remeron and decrease Trazodone and Gabapentin dosage - traZODone (DESYREL) 50 MG tablet; Take 1/2 tab =25 mg by mouth at bedtime  Dispense: 30 tablet; Refill: 3 - consulted Ellard Artis, psych NP, and agreed with the plan. She follow up on resident.   3. Neuropathy - decrease dosage of Gabapentin will hopefully lessen her somnolence - gabapentin (NEURONTIN) 100 MG capsule; Take 2 capsules = 200 mg by mouth twice a day  Dispense: 120 capsule; Refill: 3  4. Poor appetite - verbalized having good appetite now, weight is stable at 145.6 lbs - mirtazapine (REMERON) 7.5 MG tablet; Take 1 tablet (7.5 mg total) by mouth at bedtime for 3 days.  Dispense: 3 tablet; Refill: 0   Family/ staff Communication: Discussed plan of care with resident and agreed with it.  Labs/tests ordered:  None  Goals of care:  Long-term care   Durenda Age, DNP, FNP-BC [facility] [phone] (Monday-Friday 8:00 a.m. - 5:00 p.m.) [phone] (after hours)

## 2018-11-06 DIAGNOSIS — J069 Acute upper respiratory infection, unspecified: Secondary | ICD-10-CM | POA: Diagnosis not present

## 2018-11-07 DIAGNOSIS — F411 Generalized anxiety disorder: Secondary | ICD-10-CM | POA: Diagnosis not present

## 2018-11-07 DIAGNOSIS — F331 Major depressive disorder, recurrent, moderate: Secondary | ICD-10-CM | POA: Diagnosis not present

## 2018-11-15 DIAGNOSIS — M25511 Pain in right shoulder: Secondary | ICD-10-CM | POA: Diagnosis not present

## 2018-11-15 DIAGNOSIS — R1312 Dysphagia, oropharyngeal phase: Secondary | ICD-10-CM | POA: Diagnosis not present

## 2018-11-15 DIAGNOSIS — G629 Polyneuropathy, unspecified: Secondary | ICD-10-CM | POA: Diagnosis not present

## 2018-11-15 DIAGNOSIS — J9601 Acute respiratory failure with hypoxia: Secondary | ICD-10-CM | POA: Diagnosis not present

## 2018-11-15 DIAGNOSIS — R279 Unspecified lack of coordination: Secondary | ICD-10-CM | POA: Diagnosis not present

## 2018-11-15 DIAGNOSIS — M6281 Muscle weakness (generalized): Secondary | ICD-10-CM | POA: Diagnosis not present

## 2018-11-16 DIAGNOSIS — F331 Major depressive disorder, recurrent, moderate: Secondary | ICD-10-CM | POA: Diagnosis not present

## 2018-11-16 DIAGNOSIS — M6281 Muscle weakness (generalized): Secondary | ICD-10-CM | POA: Diagnosis not present

## 2018-11-16 DIAGNOSIS — R1312 Dysphagia, oropharyngeal phase: Secondary | ICD-10-CM | POA: Diagnosis not present

## 2018-11-16 DIAGNOSIS — R279 Unspecified lack of coordination: Secondary | ICD-10-CM | POA: Diagnosis not present

## 2018-11-16 DIAGNOSIS — M25511 Pain in right shoulder: Secondary | ICD-10-CM | POA: Diagnosis not present

## 2018-11-16 DIAGNOSIS — G4700 Insomnia, unspecified: Secondary | ICD-10-CM | POA: Diagnosis not present

## 2018-11-16 DIAGNOSIS — F411 Generalized anxiety disorder: Secondary | ICD-10-CM | POA: Diagnosis not present

## 2018-11-16 DIAGNOSIS — R52 Pain, unspecified: Secondary | ICD-10-CM | POA: Diagnosis not present

## 2018-11-16 DIAGNOSIS — J9601 Acute respiratory failure with hypoxia: Secondary | ICD-10-CM | POA: Diagnosis not present

## 2018-11-16 DIAGNOSIS — G629 Polyneuropathy, unspecified: Secondary | ICD-10-CM | POA: Diagnosis not present

## 2018-11-19 DIAGNOSIS — G629 Polyneuropathy, unspecified: Secondary | ICD-10-CM | POA: Diagnosis not present

## 2018-11-19 DIAGNOSIS — J9601 Acute respiratory failure with hypoxia: Secondary | ICD-10-CM | POA: Diagnosis not present

## 2018-11-19 DIAGNOSIS — M6281 Muscle weakness (generalized): Secondary | ICD-10-CM | POA: Diagnosis not present

## 2018-11-19 DIAGNOSIS — R1312 Dysphagia, oropharyngeal phase: Secondary | ICD-10-CM | POA: Diagnosis not present

## 2018-11-19 DIAGNOSIS — M25511 Pain in right shoulder: Secondary | ICD-10-CM | POA: Diagnosis not present

## 2018-11-19 DIAGNOSIS — R279 Unspecified lack of coordination: Secondary | ICD-10-CM | POA: Diagnosis not present

## 2018-11-20 DIAGNOSIS — R279 Unspecified lack of coordination: Secondary | ICD-10-CM | POA: Diagnosis not present

## 2018-11-20 DIAGNOSIS — M25511 Pain in right shoulder: Secondary | ICD-10-CM | POA: Diagnosis not present

## 2018-11-20 DIAGNOSIS — J9601 Acute respiratory failure with hypoxia: Secondary | ICD-10-CM | POA: Diagnosis not present

## 2018-11-20 DIAGNOSIS — R1312 Dysphagia, oropharyngeal phase: Secondary | ICD-10-CM | POA: Diagnosis not present

## 2018-11-20 DIAGNOSIS — G629 Polyneuropathy, unspecified: Secondary | ICD-10-CM | POA: Diagnosis not present

## 2018-11-20 DIAGNOSIS — M6281 Muscle weakness (generalized): Secondary | ICD-10-CM | POA: Diagnosis not present

## 2018-11-21 ENCOUNTER — Other Ambulatory Visit: Payer: Self-pay

## 2018-11-21 DIAGNOSIS — M25511 Pain in right shoulder: Secondary | ICD-10-CM | POA: Diagnosis not present

## 2018-11-21 DIAGNOSIS — R1312 Dysphagia, oropharyngeal phase: Secondary | ICD-10-CM | POA: Diagnosis not present

## 2018-11-21 DIAGNOSIS — J9601 Acute respiratory failure with hypoxia: Secondary | ICD-10-CM | POA: Diagnosis not present

## 2018-11-21 DIAGNOSIS — G629 Polyneuropathy, unspecified: Secondary | ICD-10-CM | POA: Diagnosis not present

## 2018-11-21 DIAGNOSIS — M6281 Muscle weakness (generalized): Secondary | ICD-10-CM | POA: Diagnosis not present

## 2018-11-21 DIAGNOSIS — R279 Unspecified lack of coordination: Secondary | ICD-10-CM | POA: Diagnosis not present

## 2018-11-21 DIAGNOSIS — R0602 Shortness of breath: Secondary | ICD-10-CM | POA: Diagnosis not present

## 2018-11-21 DIAGNOSIS — J9690 Respiratory failure, unspecified, unspecified whether with hypoxia or hypercapnia: Secondary | ICD-10-CM | POA: Diagnosis not present

## 2018-11-21 MED ORDER — HYDROCODONE-ACETAMINOPHEN 5-325 MG PO TABS: ORAL_TABLET | ORAL | 0 refills | Status: DC

## 2018-11-22 ENCOUNTER — Encounter: Payer: Self-pay | Admitting: Internal Medicine

## 2018-11-22 ENCOUNTER — Non-Acute Institutional Stay (SKILLED_NURSING_FACILITY): Payer: Medicare Other | Admitting: Internal Medicine

## 2018-11-22 DIAGNOSIS — R110 Nausea: Secondary | ICD-10-CM | POA: Diagnosis not present

## 2018-11-22 DIAGNOSIS — M6281 Muscle weakness (generalized): Secondary | ICD-10-CM | POA: Diagnosis not present

## 2018-11-22 DIAGNOSIS — J410 Simple chronic bronchitis: Secondary | ICD-10-CM

## 2018-11-22 DIAGNOSIS — J9601 Acute respiratory failure with hypoxia: Secondary | ICD-10-CM | POA: Diagnosis not present

## 2018-11-22 DIAGNOSIS — G629 Polyneuropathy, unspecified: Secondary | ICD-10-CM | POA: Diagnosis not present

## 2018-11-22 DIAGNOSIS — R630 Anorexia: Secondary | ICD-10-CM | POA: Diagnosis not present

## 2018-11-22 DIAGNOSIS — F339 Major depressive disorder, recurrent, unspecified: Secondary | ICD-10-CM

## 2018-11-22 DIAGNOSIS — R279 Unspecified lack of coordination: Secondary | ICD-10-CM | POA: Diagnosis not present

## 2018-11-22 DIAGNOSIS — R1312 Dysphagia, oropharyngeal phase: Secondary | ICD-10-CM | POA: Diagnosis not present

## 2018-11-22 DIAGNOSIS — M25511 Pain in right shoulder: Secondary | ICD-10-CM | POA: Diagnosis not present

## 2018-11-22 NOTE — Assessment & Plan Note (Addendum)
She complains of increased shortness of breath despite good O2 sats.  11/21/2018 chest x-ray showed hyperinflation and some increased reticular interstitial markings but no acute process.Mixed emphysematous & bronchospastic components clinically. Trial of low-dose prednisone 5 mg Monday, Wednesday, Friday, Sunday.

## 2018-11-22 NOTE — Progress Notes (Signed)
NURSING HOME LOCATION:  [facility] ROOM NUMBER:  202/A  CODE STATUS: Full Code   PCP: [name] MD.   This is a nursing facility follow up of chronic medical diagnoses  Interim medical record and care since last [place] visit was updated with review of diagnostic studies and change in clinical status since last visit were documented.  HPI: She is a permanent resident facility with medical diagnoses of spinal stenosis, chronic depression/anxiety, advanced COPD, chronic pain syndrome, PSVT, essential hypertension, chronic diastolic congestive heart failure, diverticulosis, polyneuropathy, CKD, and tremor. She has had multiple orthopedic surgeries for fractures.  Review of systems: She has multiple complaints which is typical for her but is most adamant about her shortness of breath.  She states that she has increased her pulmonary treatments from 2-3 to up to 4 times a day but continues to have shortness of breath.  She describes increased oral secretions but does not describe significant sputum production.  Because of the symptoms a chest x-ray was performed 11/11; this was personally reviewed.  This shows hyperinflation, osteopenia, cardiomegaly, and slight increased interstitial/ reticular markings to a mild degree.  There is no active pulmonary process. She also complains of being nauseated and having no appetite.  She is on maintenance digoxin.  She is also on a PPI.  Despite this she denies any weight loss.   She admits to anxiety and depression.  She had been working with a psychologist one-on-one; but she states she has felt too weak to do so this week.   As always she complains of right hip/pelvic pain.  Constitutional: No fever, significant weight change Eyes: No redness, discharge, pain, vision change ENT/mouth: No nasal congestion,  purulent discharge, earache, change in hearing, sore throat  Cardiovascular: No chest pain, palpitations, paroxysmal  nocturnal dyspnea, claudication, edema  Respiratory: No hemoptysis, significant snoring, apnea   Gastrointestinal: No heartburn, dysphagia, abdominal pain, vomiting, rectal bleeding, melena, change in bowels Genitourinary: No dysuria, hematuria, pyuria, incontinence, nocturia Dermatologic: No rash, pruritus, change in appearance of skin Neurologic: No dizziness, headache, syncope, seizures, numbness, tingling Psychiatric: No significant insomnia Endocrine: No change in hair/skin/nails, excessive thirst, excessive hunger, excessive urination  Hematologic/lymphatic: No significant bruising, lymphadenopathy, abnormal bleeding Allergy/immunology: No itchy/watery eyes, significant sneezing, urticaria, angioedema  Physical exam:  Pertinent or positive findings: She appears pale & chronically ill.  She has obvious tremor of the head and mandible.  She keeps her hands clasped over her abdomen but there is obvious tremor of the hands as well. She has an upper partial; she is not wearing the lower denture.  Breath sounds are decreased; she has some scattered expiratory low-grade rhonchi and musical wheezes.  The sternocleidomastoid muscles are tense suggesting accessory muscle use.  Abdomen is protuberant.  Pedal pulses are decreased.  Clubbing of the nailbeds is present.  Her fingers are cool.  She is weak to opposition in all extremities.  General appearance: no acute distress, increased work of breathing is present.   Lymphatic: No lymphadenopathy about the head, neck, axilla. Eyes: No conjunctival inflammation or lid edema is present. There is no scleral icterus. Ears:  External ear exam shows no significant lesions or deformities.   Nose:  External nasal examination shows no deformity or inflammation. Nasal mucosa are pink and moist without lesions, exudates Oral exam:  Lips and gums are healthy appearing. There is no oropharyngeal erythema or exudate. Neck:  No thyromegaly, masses, tenderness noted.   Heart:  Normal rate and  regular rhythm. S1 and S2 normal without gallop, murmur, click, rub .  Lungs:  without rubs. Abdomen: Bowel sounds are normal. Abdomen is soft and nontender with no organomegaly, hernias, masses. GU: Deferred  Extremities:  No cyanosis,  edema  Neurologic exam : Balance, Rhomberg, finger to nose testing could not be completed due to clinical state Skin:No significant lesions or rash.  See summary under each active problem in the Problem List with associated updated therapeutic plan

## 2018-11-22 NOTE — Patient Instructions (Signed)
See assessment and plan under each diagnosis in the problem list and acutely for this visit 

## 2018-11-23 DIAGNOSIS — G629 Polyneuropathy, unspecified: Secondary | ICD-10-CM | POA: Diagnosis not present

## 2018-11-23 DIAGNOSIS — M6281 Muscle weakness (generalized): Secondary | ICD-10-CM | POA: Diagnosis not present

## 2018-11-23 DIAGNOSIS — D649 Anemia, unspecified: Secondary | ICD-10-CM | POA: Diagnosis not present

## 2018-11-23 DIAGNOSIS — I4891 Unspecified atrial fibrillation: Secondary | ICD-10-CM | POA: Diagnosis not present

## 2018-11-23 DIAGNOSIS — E039 Hypothyroidism, unspecified: Secondary | ICD-10-CM | POA: Diagnosis not present

## 2018-11-23 DIAGNOSIS — R279 Unspecified lack of coordination: Secondary | ICD-10-CM | POA: Diagnosis not present

## 2018-11-23 DIAGNOSIS — M25511 Pain in right shoulder: Secondary | ICD-10-CM | POA: Diagnosis not present

## 2018-11-23 DIAGNOSIS — R1312 Dysphagia, oropharyngeal phase: Secondary | ICD-10-CM | POA: Diagnosis not present

## 2018-11-23 DIAGNOSIS — Z5181 Encounter for therapeutic drug level monitoring: Secondary | ICD-10-CM | POA: Diagnosis not present

## 2018-11-23 DIAGNOSIS — J9601 Acute respiratory failure with hypoxia: Secondary | ICD-10-CM | POA: Diagnosis not present

## 2018-11-23 LAB — BASIC METABOLIC PANEL
BUN: 14 (ref 4–21)
CO2: 28 — AB (ref 13–22)
Chloride: 99 (ref 99–108)
Creatinine: 1 (ref 0.5–1.1)
Glucose: 98
Potassium: 4.7 (ref 3.4–5.3)
Sodium: 137 (ref 137–147)

## 2018-11-23 LAB — TSH: TSH: 5.34 (ref 0.41–5.90)

## 2018-11-23 LAB — CBC AND DIFFERENTIAL
HCT: 30 — AB (ref 36–46)
Hemoglobin: 10.3 — AB (ref 12.0–16.0)
Platelets: 225 (ref 150–399)
WBC: 4.4

## 2018-11-23 LAB — CBC: RBC: 3.16 — AB (ref 3.87–5.11)

## 2018-11-23 LAB — COMPREHENSIVE METABOLIC PANEL
Calcium: 8.7 (ref 8.7–10.7)
GFR calc Af Amer: 64.42
GFR calc non Af Amer: 55.58

## 2018-11-23 NOTE — Assessment & Plan Note (Signed)
Continue psychotherapy once pulmonary & GI symptoms improved

## 2018-11-26 DIAGNOSIS — J9601 Acute respiratory failure with hypoxia: Secondary | ICD-10-CM | POA: Diagnosis not present

## 2018-11-26 DIAGNOSIS — R279 Unspecified lack of coordination: Secondary | ICD-10-CM | POA: Diagnosis not present

## 2018-11-26 DIAGNOSIS — R1312 Dysphagia, oropharyngeal phase: Secondary | ICD-10-CM | POA: Diagnosis not present

## 2018-11-26 DIAGNOSIS — G629 Polyneuropathy, unspecified: Secondary | ICD-10-CM | POA: Diagnosis not present

## 2018-11-26 DIAGNOSIS — M25511 Pain in right shoulder: Secondary | ICD-10-CM | POA: Diagnosis not present

## 2018-11-26 DIAGNOSIS — M6281 Muscle weakness (generalized): Secondary | ICD-10-CM | POA: Diagnosis not present

## 2018-11-26 NOTE — Progress Notes (Deleted)
Virtual Visit via Video Note The purpose of this virtual visit is to provide medical care while limiting exposure to the novel coronavirus.    Consent was obtained for video visit:  Yes.   Answered questions that patient had about telehealth interaction:  Yes.   I discussed the limitations, risks, security and privacy concerns of performing an evaluation and management service by telemedicine. I also discussed with the patient that there may be a patient responsible charge related to this service. The patient expressed understanding and agreed to proceed.  Pt location: Home Physician Location: home Name of referring provider:  [name], MD I connected with Dawn [name] at patients initiation/request on 11/28/2018 at  9:45 AM EST by video enabled telemedicine application and verified that I am speaking with the correct person using two identifiers. Pt MRN:  [id number] Pt DOB:  January 20, 1940 Video Participants:  Dawn [name];     History of Present Illness:   Pt seen in f/u for tremor.  She is on primidone, 150 mg in the morning and 100 mg at night.  About a month ago, I very cautiously started trihexyphenidyl, 2 mg in the morning.  I also told her to try the weighted gloves from [employer].  She reports today that ***.  Medical records from the nurse practitioner and physician from the nursing home have been reviewed since our last visit.  No hallucinations or mental status change since trihexyphenidyl was added.  Other meds:  Gabapentin; not beta blocker candidate due to copd   Current Outpatient Medications on File Prior to Visit  Medication Sig Dispense Refill   acetaminophen (TYLENOL) 325 MG tablet Take 650 mg by mouth every 6 (six) hours as needed.     aspirin 81 MG chewable tablet Chew 81 mg by mouth 2 (two) times daily.     digoxin (LANOXIN) 0.125 MG tablet Take 0.125 mg by mouth daily. Hold if HR <60     DULoxetine (CYMBALTA) 30 MG capsule Take 60 mg by mouth  daily. Take along with 30 mg to = 90 mg for depression/pain     DULoxetine HCl 30 MG CSDR Take 30 mg by mouth daily. Take along with 60 mg to = 90 mg     flecainide (TAMBOCOR) 50 MG tablet Take 50 mg by mouth every 12 (twelve) hours.     fluticasone (FLONASE ALLERGY RELIEF) 50 MCG/ACT nasal spray Place 1 spray into both nostrils daily.     gabapentin (NEURONTIN) 100 MG capsule Take 2 capsules = 200 mg by mouth twice a day 120 capsule 3   HYDROcodone-acetaminophen (NORCO/VICODIN) 5-325 MG tablet Give **1/2 tablet by mouth q 6 hrs PRN 30 tablet 0   levalbuterol (XOPENEX) 1.25 MG/3ML nebulizer solution Take 1.25 mg by nebulization 2 (two) times daily.      levalbuterol (XOPENEX) 1.25 MG/3ML nebulizer solution Take 1.25 mg by nebulization every 6 (six) hours as needed for wheezing.     levothyroxine (SYNTHROID, LEVOTHROID) 25 MCG tablet Take 25 mcg by mouth daily before breakfast.     Lidocaine (ASPERCREME LIDOCAINE) 4 % PTCH APPLY 1 PATCH TRANSDERMALLY TO LOWER BACK AT BEDTIME FOR OSTEOARTHRITIS     loratadine (CLARITIN) 10 MG tablet Take 10 mg by mouth at bedtime.     Multiple Vitamins-Minerals (MULTIVITAMIN WITH MINERALS) tablet Take 1 tablet by mouth daily.     naphazoline-glycerin (CLEAR EYES REDNESS RELIEF) 0.012-0.2 % SOLN Place 1 drop into both eyes 2 (two) times daily.  Nutritional Supplements (ENSURE PO) Take by mouth. ENSURE LIQUID Give 237 ml po daily     ondansetron (ZOFRAN) 4 MG tablet Take 4 mg by mouth 2 (two) times daily.     OXYGEN Inhale 2 L into the lungs at bedtime.     pantoprazole (PROTONIX) 40 MG tablet Take 40 mg by mouth daily.     polyethylene glycol (MIRALAX / GLYCOLAX) packet Take 17 g by mouth daily.     primidone (MYSOLINE) 50 MG tablet Take 150 mg by mouth every morning.      primidone (MYSOLINE) 50 MG tablet Take 100 mg by mouth at bedtime.      sennosides-docusate sodium (SENOKOT-S) 8.6-50 MG tablet Take 1 tablet by mouth 2 (two) times daily.       traZODone (DESYREL) 50 MG tablet Take 1/2 tab =25 mg by mouth at bedtime 30 tablet 3   trihexyphenidyl (ARTANE) 2 MG tablet Take 1 tablet (2 mg total) by mouth every morning. 90 tablet 0   umeclidinium bromide (INCRUSE ELLIPTA) 62.5 MCG/INH AEPB Inhale 1 puff into the lungs daily. (SHAKE WELL)     No current facility-administered medications on file prior to visit.      Observations/Objective:   There were no vitals filed for this visit. GEN:  The patient appears stated age and is in NAD.  Neurological examination:  Orientation: The patient is alert and oriented x3. Cranial nerves: There is good facial symmetry. There is no facial hypomimia.  The speech is fluent and clear. Soft palate rises symmetrically and there is no tongue deviation. Hearing is intact to conversational tone. Motor: Strength is at least antigravity x 4.   Shoulder shrug is equal and symmetric.  There is no pronator drift.  Movement examination: Tone: unable Abnormal movements: there is head tremor, mostly in the "no" direction.  There is bilateral UE postural tremor, at least moderate.  The longer she holds a postion the worse the tremor is.  She has mild trouble with archimedes spirals, L more than right. Coordination:  There is no decremation with RAM's with hand opening and closing, finger taps. Gait and Station: Did not walk the patient.  She is in a wheelchair.  She does state that she usually ambulates with a walker.    Assessment and Plan:   1.  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  Discussed with her that we would not get rid of her tremor without surgery.  I do not think she would be a surgery candidate given her COPD and advancing age.  She is not interested in DBS surgery anyhow.  Discussed that medications could only be expected to decrease amplitude and frequency of tremor, and likely will have any effect on head tremor.  -She is already on primidone, 50  mg, 3 tablets in the morning and 2 tablets at night.  She is not sure that is helpful.  She is not a beta-blocker candidate because of her COPD.  -Discussed with the second line medications.  Many of them are second line because of their lack of efficacy.  However, the ones that are effective, have anticholinergic properties.  This is obviously worrisome in this age group.  Discussed trihexyphenidyl, which can cause confusion, hallucinations, falls in this age group.  She very much wants to try it.  She states that her quality of life is not very good.  I talked to her about my worries with this.  She  asked me if she can try it for a month or so.  I told her that we would try 2 mg in the morning with breakfast and if she had any side effects, she is to call me or have the nursing facility call me and we will discontinue it immediately.  We talked extensively about risks, benefits, and side effects and the patient expressed clear understanding.  She was of clear and sound mind.  We discussed dry eyes and dry mouth as well.  -I still told her to try to get the weighted gloves/weighted spoons.    Follow Up Instructions:   -I discussed the assessment and treatment plan with the patient. The patient was provided an opportunity to ask questions and all were answered. The patient agreed with the plan and demonstrated an understanding of the instructions.   The patient was advised to call back or seek an in-person evaluation if the symptoms worsen or if the condition fails to improve as anticipated.    Total Time spent in visit with the patient was:  25 min, of which more than 50% of the time was spent in counseling.   Pt understands and agrees with the plan of care outlined.     Alonza Bogus, DO

## 2018-11-27 ENCOUNTER — Encounter: Payer: Self-pay | Admitting: Adult Health

## 2018-11-27 ENCOUNTER — Non-Acute Institutional Stay (SKILLED_NURSING_FACILITY): Payer: Medicare Other | Admitting: Adult Health

## 2018-11-27 DIAGNOSIS — R279 Unspecified lack of coordination: Secondary | ICD-10-CM | POA: Diagnosis not present

## 2018-11-27 DIAGNOSIS — J9601 Acute respiratory failure with hypoxia: Secondary | ICD-10-CM | POA: Diagnosis not present

## 2018-11-27 DIAGNOSIS — R251 Tremor, unspecified: Secondary | ICD-10-CM | POA: Diagnosis not present

## 2018-11-27 DIAGNOSIS — M6281 Muscle weakness (generalized): Secondary | ICD-10-CM | POA: Diagnosis not present

## 2018-11-27 DIAGNOSIS — E034 Atrophy of thyroid (acquired): Secondary | ICD-10-CM

## 2018-11-27 DIAGNOSIS — I471 Supraventricular tachycardia: Secondary | ICD-10-CM

## 2018-11-27 DIAGNOSIS — M8949 Other hypertrophic osteoarthropathy, multiple sites: Secondary | ICD-10-CM

## 2018-11-27 DIAGNOSIS — G629 Polyneuropathy, unspecified: Secondary | ICD-10-CM | POA: Diagnosis not present

## 2018-11-27 DIAGNOSIS — R1312 Dysphagia, oropharyngeal phase: Secondary | ICD-10-CM | POA: Diagnosis not present

## 2018-11-27 DIAGNOSIS — I4719 Other supraventricular tachycardia: Secondary | ICD-10-CM

## 2018-11-27 DIAGNOSIS — M159 Polyosteoarthritis, unspecified: Secondary | ICD-10-CM

## 2018-11-27 DIAGNOSIS — M150 Primary generalized (osteo)arthritis: Secondary | ICD-10-CM

## 2018-11-27 DIAGNOSIS — M25511 Pain in right shoulder: Secondary | ICD-10-CM | POA: Diagnosis not present

## 2018-11-27 NOTE — Progress Notes (Signed)
Location:  [facility] Room Number: 202/A Place of Service:  SNF (31) Provider:  Durenda Age, DNP, FNP-BC  Patient Care Team: [name], MD as PCP - General (Internal Medicine) Medina-Vargas, [name], NP as Nurse Practitioner (Internal Medicine)  Extended Emergency Contact Information Primary Emergency Contact: Hatfield,Lisa Address: [address]          [place], [place] [address] [place] Work Phone: [phone] Mobile Phone: [phone] Relation: Daughter  Code Status: Full Code  Goals of care: Advanced Directive information Advanced Directives 11/27/2018  Does Patient Have a Medical Advance Directive? Yes  Type of Advance Directive (No Data)  Does patient want to make changes to medical advance directive? No - Patient declined  Would patient like information on creating a medical advance directive? -     Chief Complaint  Patient presents with  . Acute Visit    Low digoxin level and elevated tsh    HPI:  Pt is a 78 y.o. female seen today for lab showing tsh 5.34, elevated and digoxin level 0.65, low. She denies nausea today. She currently takes Levothyroxine 25 mcg daily for hypothyroidism. She is on Digoxin 125 mcg daily for atrial fibrillation. She denies having palpitations. She has chronic hip pains and takes PRN Norco and Acetaminophen. Chest x-ray recently done was negative for acute disease. She continues to use O2 @ 2L/min via [place] continuously. She constantly complains of having tremors and verbalized that she has difficulty drinking and eating. She is currently on Primidone and Trihexyphenidyl. She, also, complains of multiple joint pains - neck, hips and ankles. She is taking Duloxetine, PRN Norco and PRN Acetaminophen for pain.  Past Medical History:  Diagnosis Date  . COPD (chronic obstructive pulmonary disease) ([place])   . History of anxiety   . History of depression   . Spinal stenosis   . UTI (urinary  tract infection)    Past Surgical History:  Procedure Laterality Date  . ANKLE FRACTURE SURGERY Left   . CATARACT EXTRACTION, BILATERAL    . FLEXIBLE SIGMOIDOSCOPY Left 11/18/2015   Procedure: FLEXIBLE SIGMOIDOSCOPY;  Surgeon: [name], MD;  Location: [facility];  Service: Endoscopy;  Laterality: Left;  . FLEXIBLE SIGMOIDOSCOPY N/A 11/20/2015   Procedure: FLEXIBLE SIGMOIDOSCOPY;  Surgeon: [name], MD;  Location: [facility] ENDOSCOPY;  Service: Endoscopy;  Laterality: N/A;  . HIP ARTHROPLASTY Right 01/12/2016   Procedure: ARTHROPLASTY BIPOLAR HIP (HEMIARTHROPLASTY);  Surgeon: [name], MD;  Location: [facility];  Service: Orthopedics;  Laterality: Right;    Allergies  Allergen Reactions  . Biofreeze [Menthol (Topical Analgesic)] Rash    By history Aspercreme does not cause a rash    Outpatient Encounter Medications as of 11/27/2018  Medication Sig  . acetaminophen (TYLENOL) 325 MG tablet Take 650 mg by mouth every 6 (six) hours as needed.  Marland Kitchen alum & mag hydroxide-simeth (MAALOX PLUS) 400-400-40 MG/5ML suspension Give 30cc PO q2H PRN x 24H (call MD if indigestion is not relieved w/i 24H)  . aspirin 81 MG chewable tablet Chew 81 mg by mouth 2 (two) times daily.  . digoxin (LANOXIN) 0.125 MG tablet Take 0.125 mg by mouth daily. Hold if HR <60  . DULoxetine (CYMBALTA) 30 MG capsule Take 60 mg by mouth daily. Take along with 30 mg to = 90 mg for depression/pain  . DULoxetine HCl 30 MG CSDR Take 30 mg by mouth daily. Take along with 60 mg to = 90 mg  . flecainide (TAMBOCOR) 50 MG  tablet Take 50 mg by mouth every 12 (twelve) hours.  . fluticasone (FLONASE ALLERGY RELIEF) 50 MCG/ACT nasal spray Place 1 spray into both nostrils daily.  Marland Kitchen gabapentin (NEURONTIN) 100 MG capsule Take 2 capsules = 200 mg by mouth twice a day  . HYDROcodone-acetaminophen (NORCO/VICODIN) 5-325 MG tablet Give **1/2 tablet by mouth q 6 hrs PRN  . levalbuterol (XOPENEX) 1.25 MG/3ML nebulizer solution Take 1.25 mg by  nebulization 2 (two) times daily.   Marland Kitchen levalbuterol (XOPENEX) 1.25 MG/3ML nebulizer solution Take 1.25 mg by nebulization every 6 (six) hours as needed for wheezing.  Marland Kitchen levothyroxine (SYNTHROID, LEVOTHROID) 25 MCG tablet Take 37.5 mcg by mouth daily before breakfast.   . Lidocaine (ASPERCREME LIDOCAINE) 4 % PTCH APPLY 1 PATCH TRANSDERMALLY TO LOWER BACK AT BEDTIME FOR OSTEOARTHRITIS  . loratadine (CLARITIN) 10 MG tablet Take 10 mg by mouth at bedtime.  . Multiple Vitamins-Minerals (MULTIVITAMIN WITH MINERALS) tablet Take 1 tablet by mouth daily.  . naphazoline-glycerin (CLEAR EYES REDNESS RELIEF) 0.012-0.2 % SOLN Place 1 drop into both eyes 2 (two) times daily.  . Nutritional Supplements (ENSURE PO) Take by mouth. ENSURE LIQUID Give 237 ml po daily  . ondansetron (ZOFRAN) 4 MG tablet Take 4 mg by mouth 2 (two) times daily.  . OXYGEN Inhale 2 L into the lungs at bedtime.  . pantoprazole (PROTONIX) 40 MG tablet Take 40 mg by mouth daily.  . polyethylene glycol (MIRALAX / GLYCOLAX) packet Take 17 g by mouth daily.  . predniSONE (DELTASONE) 5 MG tablet Give 5mg  by mouth M - W - F - Sun. with breakfast Dx: COPD  . primidone (MYSOLINE) 50 MG tablet Take 150 mg by mouth every morning.   . primidone (MYSOLINE) 50 MG tablet Take 100 mg by mouth at bedtime.   . sennosides-docusate sodium (SENOKOT-S) 8.6-50 MG tablet Take 1 tablet by mouth 2 (two) times daily.   . traZODone (DESYREL) 50 MG tablet Take 1/2 tab =25 mg by mouth at bedtime  . trihexyphenidyl (ARTANE) 2 MG tablet Take 1 tablet (2 mg total) by mouth every morning.  . umeclidinium bromide (INCRUSE ELLIPTA) 62.5 MCG/INH AEPB Inhale 1 puff into the lungs daily. (SHAKE WELL)   No facility-administered encounter medications on file as of 11/27/2018.     Review of Systems  GENERAL: No change in appetite, no fatigue, no weight changes, no fever, chills or weakness MOUTH and THROAT: Denies oral discomfort, gingival pain or bleeding RESPIRATORY: no  cough, SOB, DOE, wheezing, hemoptysis CARDIAC: No chest pain, edema or palpitations GI: No abdominal pain, diarrhea, constipation, heart burn, nausea or vomiting GU: Denies dysuria, frequency, hematuria, incontinence, or discharge NEUROLOGICAL: Denies dizziness, syncope, numbness, or headache PSYCHIATRIC: Denies feelings of depression or anxiety. No report of hallucinations, insomnia, paranoia, or agitation   Immunization History  Administered Date(s) Administered  . Influenza, High Dose Seasonal PF 10/04/2016  . Influenza,inj,Quad PF,6+ Mos 01/13/2016  . Influenza-Unspecified 11/10/2017, 10/09/2018  . Pneumococcal-Unspecified 10/11/2011  . Tdap 01/23/2016   Pertinent  Health Maintenance Due  Topic Date Due  . INFLUENZA VACCINE  Completed  . PNA vac Low Risk Adult  Completed  . DEXA SCAN  Discontinued   Fall Risk  10/24/2018 07/19/2018 01/17/2018 11/09/2017 07/14/2017  Falls in the past year? 0 0 0 No No  Number falls in past yr: 0 0 0 - -  Injury with Fall? 0 0 0 - -  Comment - - - - -  Risk Factor Category  - - - - -  Follow up - - Falls evaluation completed - -     Vitals:   11/27/18 1555  BP: 123/61  Pulse: 78  Resp: 20  Temp: 97.8 F (36.6 C)  TempSrc: Oral  SpO2: 97%  Weight: 145 lb 9.6 oz (66 kg)  Height: 5\' 8"  (1.727 m)   Body mass index is 22.14 kg/m.  Physical Exam  GENERAL APPEARANCE: Well nourished. In no acute distress. Normal body habitus SKIN:  Skin is warm and dry.  MOUTH and THROAT: Lips are without lesions. Oral mucosa is moist and without lesions. Tongue is normal in shape, size, and color and without lesions RESPIRATORY: Breathing is even & unlabored, BS CTAB CARDIAC: no murmur,no extra heart sounds, no edema GI: Abdomen soft, normal BS, no masses, no tenderness EXTREMITIES:  Able to move X 4 extremities NEUROLOGICAL: + tremors. Speech is clear. Alert and oriented X 3. PSYCHIATRIC:  Affect and behavior are appropriate  Labs reviewed: Recent  Labs    12/05/17 03/02/18  NA 141 141  K 5.1 4.5  CL  --  102  CO2  --  29  BUN 16 11  CREATININE 1.1 1.0  CALCIUM  --  8.2   Recent Labs    12/05/17  AST 12*  ALT 8  ALKPHOS 97   Recent Labs    12/05/17 01/24/18 01/29/18 07/17/18  WBC 6.2 8.2 9.0 5.0  NEUTROABS 4 7 7   --   HGB 10.5* 9.7* 10.4* 10.8*  HCT 31* 29* 30* 31*  PLT 213 294 358 223   Lab Results  Component Value Date   TSH 4.17 03/02/2018   Lab Results  Component Value Date   HGBA1C 5.2 12/01/2015   Lab Results  Component Value Date   CHOL 107 12/01/2015   HDL 27 (L) 12/01/2015   LDLCALC 59 12/01/2015   TRIG 107 12/01/2015   CHOLHDL 4.0 12/01/2015     Assessment/Plan   1. Hypothyroidism due to acquired atrophy of thyroid - tsh 5.34, elevated, will increase levothyroxine 25 mcg from 1 tab to 1.5 tab = 37.5 mg daily, repeat TSH in 6 weeks  2. Paroxysmal junctional tachycardia (HCC) -Digoxin level 0.65,, continue digoxin 125 mcg 1 tab daily, flecainide acetate 50 mg 1 tab every 12 hours  3. Tremor -Verbalized having difficulty when eating due to excessive tremors, continue primidone 50 mg 2 tabs = 100 mg at bedtime and trihexyphenidyl 2 mg 1 tab in the morning, follow-up with neurology  4. Primary osteoarthritis involving multiple joints -Continue Norco 5-325-325 mg 1/2 tab every 6 hours as needed and acetaminophen 325 mg 2 tabs = 650 mg every 6 hours as needed for pain and duloxetine 60 mg and 30 mg = 90 mg daily, follow-up with orthopedics   Family/ staff Communication: Discussed plan of care with resident.  Labs/tests ordered:  tsh in 6 weeks  Goals of care:   Long-term care   Durenda Age, DNP, FNP-BC [facility] [phone] (Monday-Friday 8:00 a.m. - 5:00 p.m.) [phone] (after hours)

## 2018-11-28 ENCOUNTER — Telehealth: Payer: Medicare Other | Admitting: Neurology

## 2018-11-28 ENCOUNTER — Other Ambulatory Visit: Payer: Self-pay

## 2018-11-28 DIAGNOSIS — M25511 Pain in right shoulder: Secondary | ICD-10-CM | POA: Diagnosis not present

## 2018-11-28 DIAGNOSIS — R279 Unspecified lack of coordination: Secondary | ICD-10-CM | POA: Diagnosis not present

## 2018-11-28 DIAGNOSIS — R1312 Dysphagia, oropharyngeal phase: Secondary | ICD-10-CM | POA: Diagnosis not present

## 2018-11-28 DIAGNOSIS — F331 Major depressive disorder, recurrent, moderate: Secondary | ICD-10-CM | POA: Diagnosis not present

## 2018-11-28 DIAGNOSIS — M6281 Muscle weakness (generalized): Secondary | ICD-10-CM | POA: Diagnosis not present

## 2018-11-28 DIAGNOSIS — G629 Polyneuropathy, unspecified: Secondary | ICD-10-CM | POA: Diagnosis not present

## 2018-11-28 DIAGNOSIS — F411 Generalized anxiety disorder: Secondary | ICD-10-CM | POA: Diagnosis not present

## 2018-11-28 DIAGNOSIS — J9601 Acute respiratory failure with hypoxia: Secondary | ICD-10-CM | POA: Diagnosis not present

## 2018-11-29 DIAGNOSIS — G629 Polyneuropathy, unspecified: Secondary | ICD-10-CM | POA: Diagnosis not present

## 2018-11-29 DIAGNOSIS — R279 Unspecified lack of coordination: Secondary | ICD-10-CM | POA: Diagnosis not present

## 2018-11-29 DIAGNOSIS — M6281 Muscle weakness (generalized): Secondary | ICD-10-CM | POA: Diagnosis not present

## 2018-11-29 DIAGNOSIS — M25511 Pain in right shoulder: Secondary | ICD-10-CM | POA: Diagnosis not present

## 2018-11-29 DIAGNOSIS — J9601 Acute respiratory failure with hypoxia: Secondary | ICD-10-CM | POA: Diagnosis not present

## 2018-11-29 DIAGNOSIS — R1312 Dysphagia, oropharyngeal phase: Secondary | ICD-10-CM | POA: Diagnosis not present

## 2018-11-30 DIAGNOSIS — R279 Unspecified lack of coordination: Secondary | ICD-10-CM | POA: Diagnosis not present

## 2018-11-30 DIAGNOSIS — J9601 Acute respiratory failure with hypoxia: Secondary | ICD-10-CM | POA: Diagnosis not present

## 2018-11-30 DIAGNOSIS — M25511 Pain in right shoulder: Secondary | ICD-10-CM | POA: Diagnosis not present

## 2018-11-30 DIAGNOSIS — G4700 Insomnia, unspecified: Secondary | ICD-10-CM | POA: Diagnosis not present

## 2018-11-30 DIAGNOSIS — F331 Major depressive disorder, recurrent, moderate: Secondary | ICD-10-CM | POA: Diagnosis not present

## 2018-11-30 DIAGNOSIS — R1312 Dysphagia, oropharyngeal phase: Secondary | ICD-10-CM | POA: Diagnosis not present

## 2018-11-30 DIAGNOSIS — R52 Pain, unspecified: Secondary | ICD-10-CM | POA: Diagnosis not present

## 2018-11-30 DIAGNOSIS — F411 Generalized anxiety disorder: Secondary | ICD-10-CM | POA: Diagnosis not present

## 2018-11-30 DIAGNOSIS — G629 Polyneuropathy, unspecified: Secondary | ICD-10-CM | POA: Diagnosis not present

## 2018-11-30 DIAGNOSIS — M6281 Muscle weakness (generalized): Secondary | ICD-10-CM | POA: Diagnosis not present

## 2018-12-03 DIAGNOSIS — R1312 Dysphagia, oropharyngeal phase: Secondary | ICD-10-CM | POA: Diagnosis not present

## 2018-12-03 DIAGNOSIS — G629 Polyneuropathy, unspecified: Secondary | ICD-10-CM | POA: Diagnosis not present

## 2018-12-03 DIAGNOSIS — R279 Unspecified lack of coordination: Secondary | ICD-10-CM | POA: Diagnosis not present

## 2018-12-03 DIAGNOSIS — M6281 Muscle weakness (generalized): Secondary | ICD-10-CM | POA: Diagnosis not present

## 2018-12-03 DIAGNOSIS — J9601 Acute respiratory failure with hypoxia: Secondary | ICD-10-CM | POA: Diagnosis not present

## 2018-12-03 DIAGNOSIS — M25511 Pain in right shoulder: Secondary | ICD-10-CM | POA: Diagnosis not present

## 2018-12-04 DIAGNOSIS — M6281 Muscle weakness (generalized): Secondary | ICD-10-CM | POA: Diagnosis not present

## 2018-12-04 DIAGNOSIS — J9601 Acute respiratory failure with hypoxia: Secondary | ICD-10-CM | POA: Diagnosis not present

## 2018-12-04 DIAGNOSIS — M25511 Pain in right shoulder: Secondary | ICD-10-CM | POA: Diagnosis not present

## 2018-12-04 DIAGNOSIS — R279 Unspecified lack of coordination: Secondary | ICD-10-CM | POA: Diagnosis not present

## 2018-12-04 DIAGNOSIS — G629 Polyneuropathy, unspecified: Secondary | ICD-10-CM | POA: Diagnosis not present

## 2018-12-04 DIAGNOSIS — R1312 Dysphagia, oropharyngeal phase: Secondary | ICD-10-CM | POA: Diagnosis not present

## 2018-12-05 DIAGNOSIS — J9601 Acute respiratory failure with hypoxia: Secondary | ICD-10-CM | POA: Diagnosis not present

## 2018-12-05 DIAGNOSIS — M25511 Pain in right shoulder: Secondary | ICD-10-CM | POA: Diagnosis not present

## 2018-12-05 DIAGNOSIS — R1312 Dysphagia, oropharyngeal phase: Secondary | ICD-10-CM | POA: Diagnosis not present

## 2018-12-05 DIAGNOSIS — G629 Polyneuropathy, unspecified: Secondary | ICD-10-CM | POA: Diagnosis not present

## 2018-12-05 DIAGNOSIS — M6281 Muscle weakness (generalized): Secondary | ICD-10-CM | POA: Diagnosis not present

## 2018-12-05 DIAGNOSIS — R279 Unspecified lack of coordination: Secondary | ICD-10-CM | POA: Diagnosis not present

## 2018-12-07 DIAGNOSIS — G629 Polyneuropathy, unspecified: Secondary | ICD-10-CM | POA: Diagnosis not present

## 2018-12-07 DIAGNOSIS — M25511 Pain in right shoulder: Secondary | ICD-10-CM | POA: Diagnosis not present

## 2018-12-07 DIAGNOSIS — J9601 Acute respiratory failure with hypoxia: Secondary | ICD-10-CM | POA: Diagnosis not present

## 2018-12-07 DIAGNOSIS — M6281 Muscle weakness (generalized): Secondary | ICD-10-CM | POA: Diagnosis not present

## 2018-12-07 DIAGNOSIS — R279 Unspecified lack of coordination: Secondary | ICD-10-CM | POA: Diagnosis not present

## 2018-12-07 DIAGNOSIS — R1312 Dysphagia, oropharyngeal phase: Secondary | ICD-10-CM | POA: Diagnosis not present

## 2018-12-08 DIAGNOSIS — R279 Unspecified lack of coordination: Secondary | ICD-10-CM | POA: Diagnosis not present

## 2018-12-08 DIAGNOSIS — J9601 Acute respiratory failure with hypoxia: Secondary | ICD-10-CM | POA: Diagnosis not present

## 2018-12-08 DIAGNOSIS — G629 Polyneuropathy, unspecified: Secondary | ICD-10-CM | POA: Diagnosis not present

## 2018-12-08 DIAGNOSIS — M6281 Muscle weakness (generalized): Secondary | ICD-10-CM | POA: Diagnosis not present

## 2018-12-08 DIAGNOSIS — R1312 Dysphagia, oropharyngeal phase: Secondary | ICD-10-CM | POA: Diagnosis not present

## 2018-12-08 DIAGNOSIS — M25511 Pain in right shoulder: Secondary | ICD-10-CM | POA: Diagnosis not present

## 2018-12-10 DIAGNOSIS — R1312 Dysphagia, oropharyngeal phase: Secondary | ICD-10-CM | POA: Diagnosis not present

## 2018-12-10 DIAGNOSIS — J9601 Acute respiratory failure with hypoxia: Secondary | ICD-10-CM | POA: Diagnosis not present

## 2018-12-10 DIAGNOSIS — R279 Unspecified lack of coordination: Secondary | ICD-10-CM | POA: Diagnosis not present

## 2018-12-10 DIAGNOSIS — M25511 Pain in right shoulder: Secondary | ICD-10-CM | POA: Diagnosis not present

## 2018-12-10 DIAGNOSIS — M6281 Muscle weakness (generalized): Secondary | ICD-10-CM | POA: Diagnosis not present

## 2018-12-10 DIAGNOSIS — G629 Polyneuropathy, unspecified: Secondary | ICD-10-CM | POA: Diagnosis not present

## 2018-12-10 IMAGING — MR MR LUMBAR SPINE W/O CM
4 of 6 series · 21 of 48 positions shown · non-contrast
Comparison: Lumbar radiographs 07/18/2017

CLINICAL DATA: Lumbar radiculopathy. Back pain with right buttock
and hip pain

EXAM:
MRI LUMBAR SPINE WITHOUT CONTRAST
TECHNIQUE: Multiplanar, multisequence MR imaging of the lumbar spine was
performed. No intravenous contrast was administered.

[Series 2: T2 · sagittal · 4.0mm · 0.55mm/px · 7 of 17 slices shown (1 of 3)]
[im 1/17]
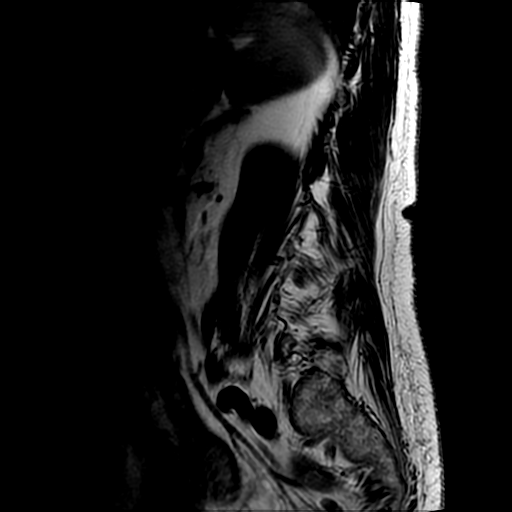
[im 3/17]
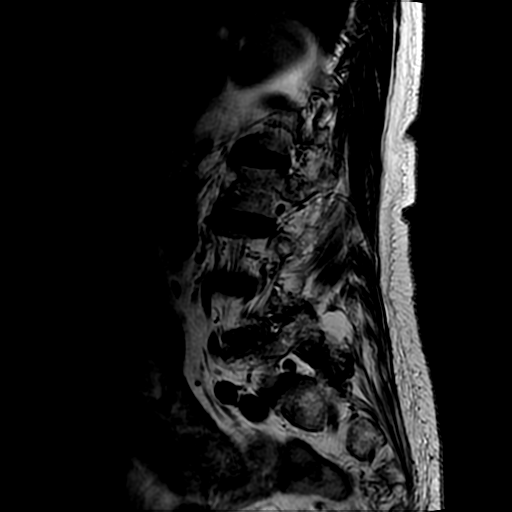
[im 6/17]
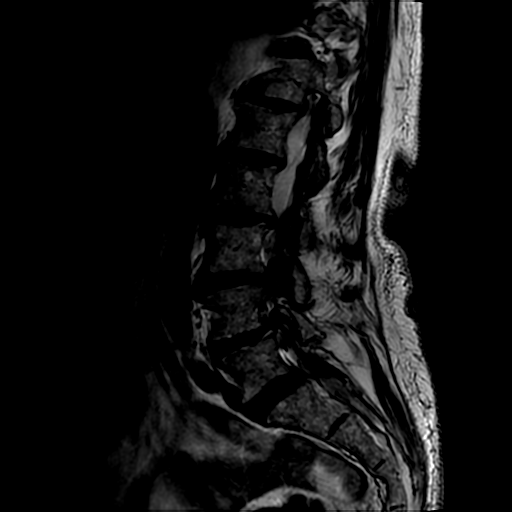
[im 9/17]
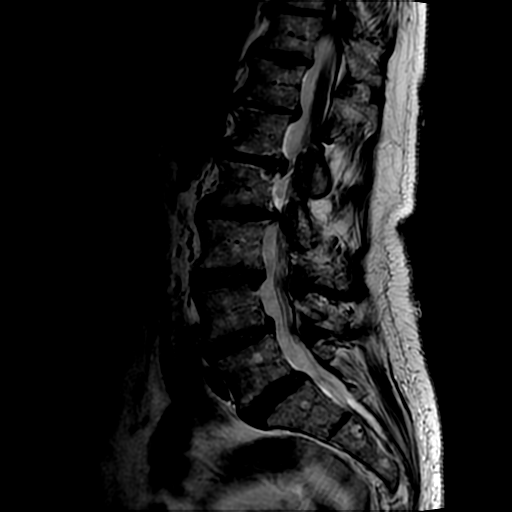
[im 11/17]
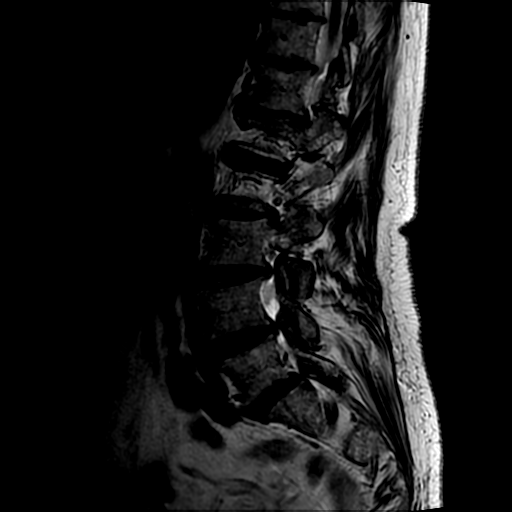
[im 14/17]
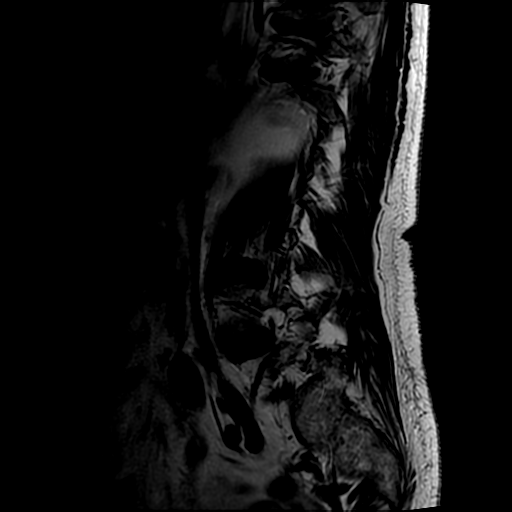
[im 17/17]
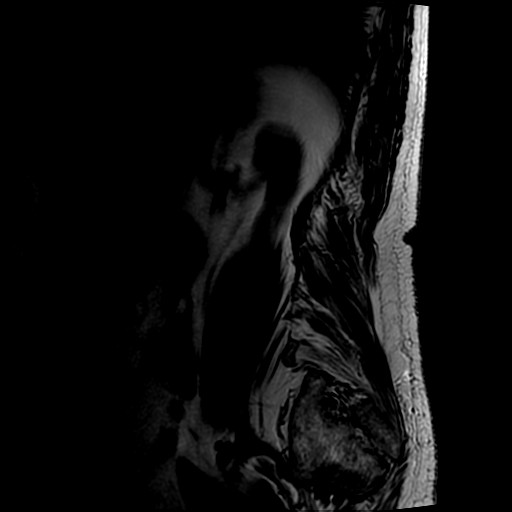

[Series 4: T1 · sagittal · 4.0mm · 0.55mm/px · 3 of 17 slices shown]
[im 4/17]
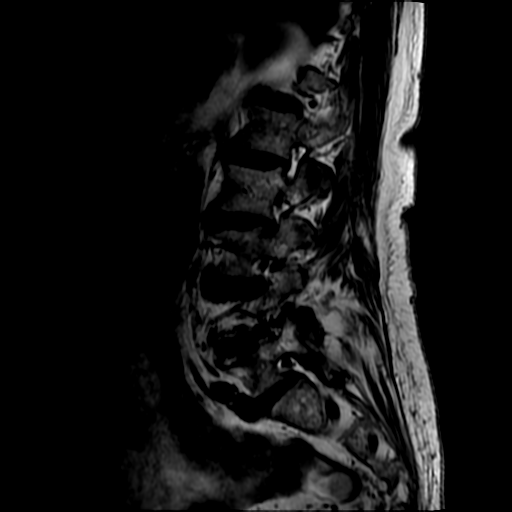
[im 10/17]
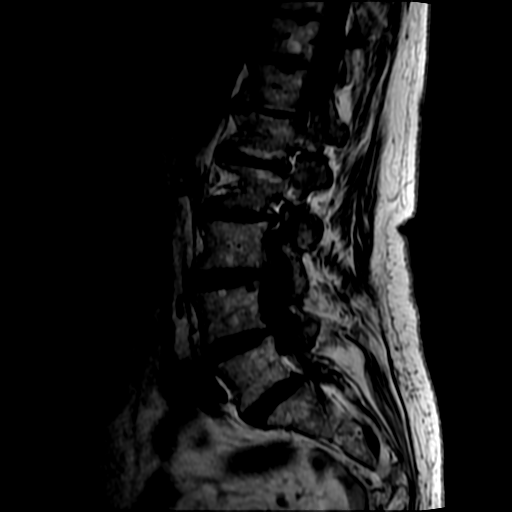
[im 17/17]
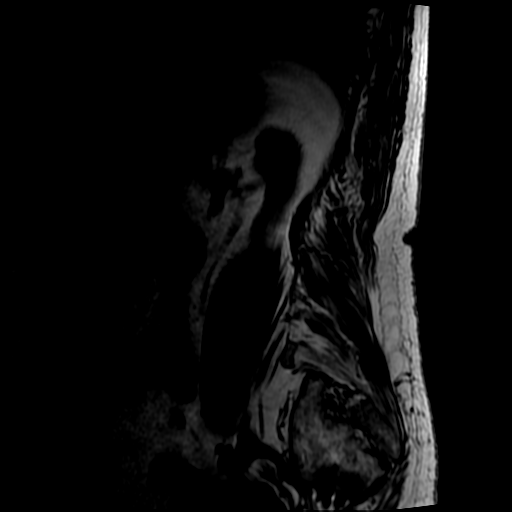

[Series 5: T2 · axial · 4.0mm · 0.39mm/px · z∈[-102,+68]mm · 8 of 33 slices shown (2 of 3)]
[im 1/33]
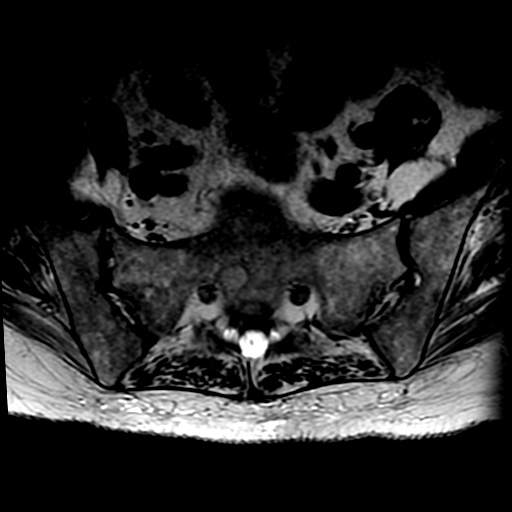
[im 6/33]
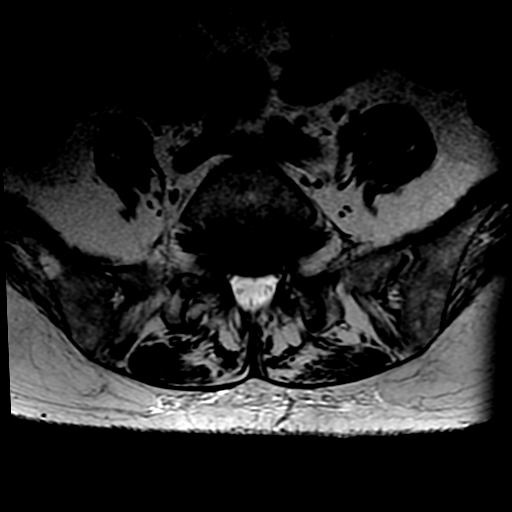
[im 9/33]
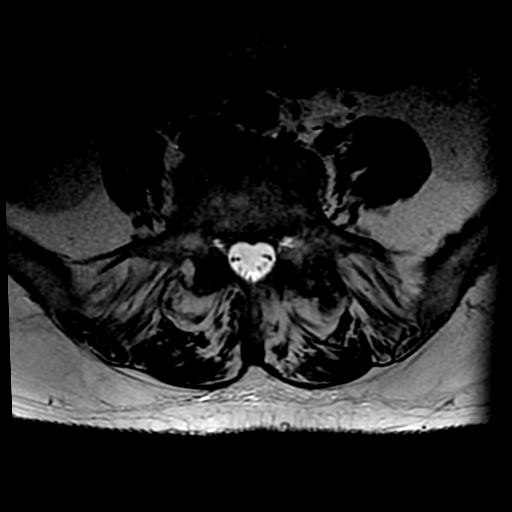
[im 15/33]
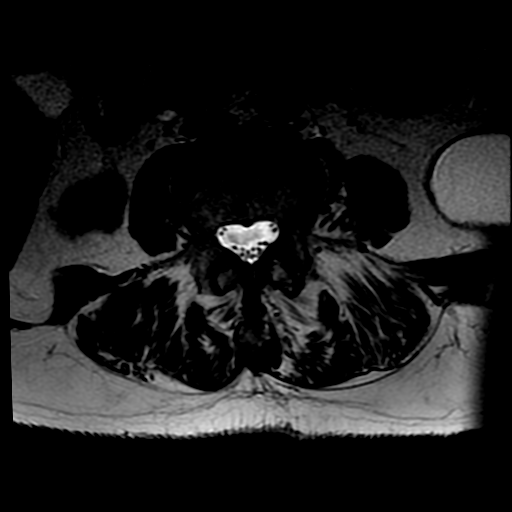
[im 18/33]
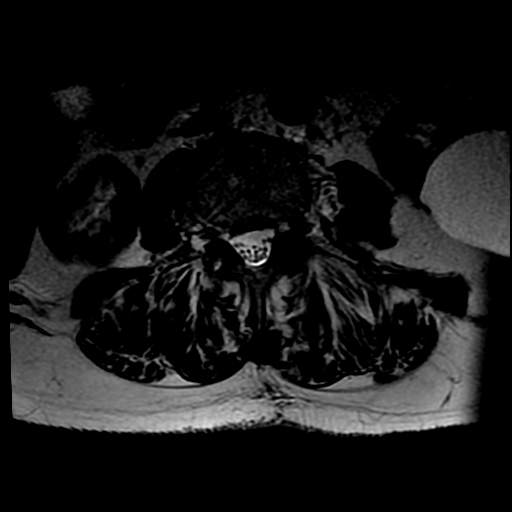
[im 24/33]
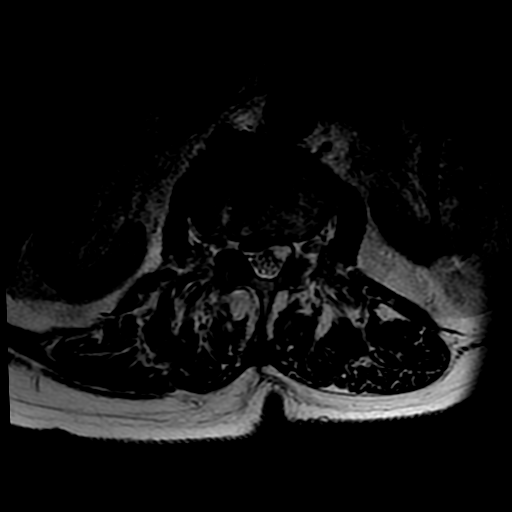
[im 27/33]
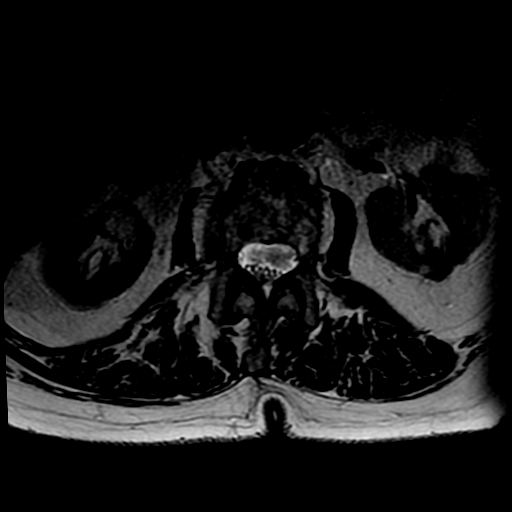
[im 30/33]
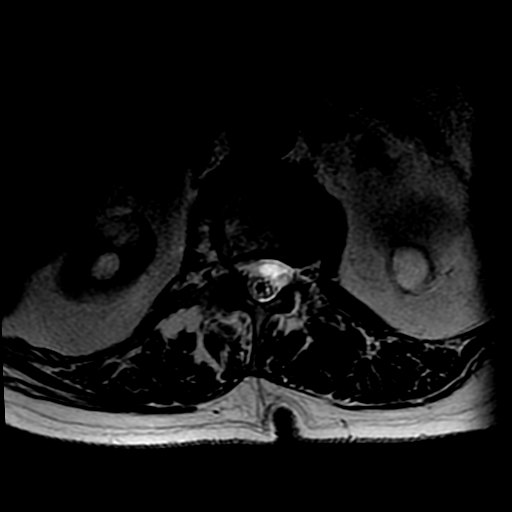

[Series 7: T2 · coronal · 5.0mm · 1.09mm/px · 3 of 15 slices shown (3 of 3)]
[im 1/15]
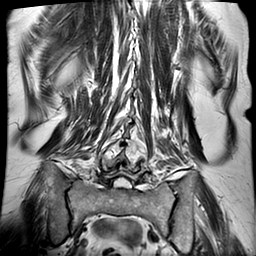
[im 8/15]
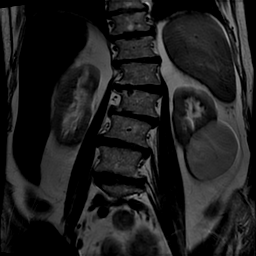
[im 15/15]
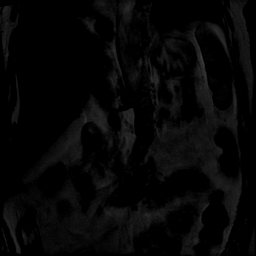

[21 of 48 positions shown; findings below may reference images not displayed]

FINDINGS: Segmentation:  Normal

Alignment: Moderate levoscoliosis. Mild retrolisthesis L2-3 and
L3-4. 4 mm anterolisthesis L5-S1

Vertebrae:  Negative for fracture or mass.

Conus medullaris and cauda equina: Conus extends to the L2 level.
Conus and cauda equina appear normal.

Paraspinal and other soft tissues: 6 cm left renal cyst. No
paraspinous mass.

Disc levels:

T12-L1: Moderate disc degeneration and spurring with right foraminal
stenosis

L1-2: Disc degeneration and spurring right greater than left with
moderate right foraminal encroachment.

L2-3: Disc degeneration and diffuse bulging of the disc. Mild spinal
stenosis. Moderate subarticular stenosis bilaterally right greater
than left due to spurring.

L3-4: Disc degeneration and spondylosis with mild facet
degeneration. Mild subarticular stenosis bilaterally

L4-5: Disc degeneration and spurring. Marked facet hypertrophy on
the left. Severe subarticular foraminal stenosis on the left due to
spurring.

L5-S1: Mild anterolisthesis. Bilateral facet degeneration with mild
subarticular stenosis bilaterally.
IMPRESSION: Lumbar scoliosis with multilevel degenerative change causing
stenosis as described above. The most severe stenosis is on the
right at L4-5. There is right foraminal stenosis at T12-L1 L1-2 and
L2-3.

## 2018-12-12 DIAGNOSIS — F411 Generalized anxiety disorder: Secondary | ICD-10-CM | POA: Diagnosis not present

## 2018-12-18 ENCOUNTER — Encounter: Payer: Self-pay | Admitting: Adult Health

## 2018-12-18 ENCOUNTER — Other Ambulatory Visit: Payer: Self-pay

## 2018-12-18 ENCOUNTER — Encounter: Payer: Self-pay | Admitting: Orthopaedic Surgery

## 2018-12-18 ENCOUNTER — Non-Acute Institutional Stay (SKILLED_NURSING_FACILITY): Payer: Medicare Other | Admitting: Adult Health

## 2018-12-18 ENCOUNTER — Ambulatory Visit (INDEPENDENT_AMBULATORY_CARE_PROVIDER_SITE_OTHER): Payer: Medicare Other

## 2018-12-18 ENCOUNTER — Ambulatory Visit (INDEPENDENT_AMBULATORY_CARE_PROVIDER_SITE_OTHER): Payer: Medicare Other | Admitting: Orthopaedic Surgery

## 2018-12-18 VITALS — Ht 68.0 in | Wt 145.0 lb

## 2018-12-18 DIAGNOSIS — J302 Other seasonal allergic rhinitis: Secondary | ICD-10-CM | POA: Diagnosis not present

## 2018-12-18 DIAGNOSIS — I471 Supraventricular tachycardia: Secondary | ICD-10-CM

## 2018-12-18 DIAGNOSIS — F339 Major depressive disorder, recurrent, unspecified: Secondary | ICD-10-CM

## 2018-12-18 DIAGNOSIS — M545 Low back pain: Secondary | ICD-10-CM

## 2018-12-18 DIAGNOSIS — G8929 Other chronic pain: Secondary | ICD-10-CM

## 2018-12-18 DIAGNOSIS — R251 Tremor, unspecified: Secondary | ICD-10-CM

## 2018-12-18 DIAGNOSIS — J410 Simple chronic bronchitis: Secondary | ICD-10-CM | POA: Diagnosis not present

## 2018-12-18 DIAGNOSIS — E034 Atrophy of thyroid (acquired): Secondary | ICD-10-CM

## 2018-12-18 NOTE — Progress Notes (Signed)
Location:  [facility] Room Number: 202/A Place of Service:  SNF (31) Provider:  Durenda Age, DNP, FNP-BC  Patient Care Team: [name], MD as PCP - General (Internal Medicine) Medina-Vargas, [name], NP as Nurse Practitioner (Internal Medicine)  Extended Emergency Contact Information Primary Emergency Contact: Hatfield,Lisa Address: [address]          [place], [place] [address] [place] Work Phone: [phone] Mobile Phone: [phone] Relation: Daughter  Code Status:  Full Code  Goals of care: Advanced Directive information Advanced Directives 12/18/2018  Does Patient Have a Medical Advance Directive? Yes  Type of Advance Directive (No Data)  Does patient want to make changes to medical advance directive? No - Patient declined  Would patient like information on creating a medical advance directive? -     Chief Complaint  Patient presents with  . Medical Management of Chronic Issues    Routine visit of medical management    HPI:  Pt is a 78 y.o. female seen today for medical management of chronic diseases.  She has PMH of COPD, anemia, hypothyroidism, chronic pain syndrome and depression.  She has an appointment today with orthopedics regarding low back pain.  She gabapentin and as needed Norco for chronic pain.  She denies having palpitations.  She takes flecainide for history of SVT.   Past Medical History:  Diagnosis Date  . COPD (chronic obstructive pulmonary disease) ([place])   . History of anxiety   . History of depression   . Spinal stenosis   . UTI (urinary tract infection)    Past Surgical History:  Procedure Laterality Date  . ANKLE FRACTURE SURGERY Left   . CATARACT EXTRACTION, BILATERAL    . FLEXIBLE SIGMOIDOSCOPY Left 11/18/2015   Procedure: FLEXIBLE SIGMOIDOSCOPY;  Surgeon: [name], MD;  Location: [facility];  Service: Endoscopy;  Laterality: Left;  . FLEXIBLE SIGMOIDOSCOPY N/A 11/20/2015   Procedure: FLEXIBLE SIGMOIDOSCOPY;  Surgeon: [name], MD;  Location: [facility] ENDOSCOPY;  Service: Endoscopy;  Laterality: N/A;  . HIP ARTHROPLASTY Right 01/12/2016   Procedure: ARTHROPLASTY BIPOLAR HIP (HEMIARTHROPLASTY);  Surgeon: [name], MD;  Location: [facility];  Service: Orthopedics;  Laterality: Right;    Allergies  Allergen Reactions  . Biofreeze [Menthol (Topical Analgesic)] Rash    By history Aspercreme does not cause a rash    Outpatient Encounter Medications as of 12/18/2018  Medication Sig  . acetaminophen (TYLENOL) 325 MG tablet Take 650 mg by mouth every 6 (six) hours as needed.  Marland Kitchen alum & mag hydroxide-simeth (MAALOX PLUS) 400-400-40 MG/5ML suspension Give 30cc PO q2H PRN x 24H (call MD if indigestion is not relieved w/i 24H)  . aspirin 81 MG chewable tablet Chew 81 mg by mouth 2 (two) times daily.  . digoxin (LANOXIN) 0.125 MG tablet Take 0.125 mg by mouth daily. Hold if HR <60  . DULoxetine (CYMBALTA) 30 MG capsule Take 60 mg by mouth daily. Take along with 30 mg to = 90 mg for depression/pain  . DULoxetine HCl 30 MG CSDR Take 30 mg by mouth daily. Take along with 60 mg to = 90 mg  . flecainide (TAMBOCOR) 50 MG tablet Take 50 mg by mouth every 12 (twelve) hours.  . fluticasone (FLONASE ALLERGY RELIEF) 50 MCG/ACT nasal spray Place 1 spray into both nostrils daily.  Marland Kitchen gabapentin (NEURONTIN) 100 MG capsule Take 2 capsules = 200 mg by mouth twice a day  . HYDROcodone-acetaminophen (NORCO/VICODIN) 5-325 MG tablet Give **1/2 tablet by  mouth q 6 hrs PRN  . levalbuterol (XOPENEX) 1.25 MG/3ML nebulizer solution Take 1.25 mg by nebulization 2 (two) times daily.   Marland Kitchen levalbuterol (XOPENEX) 1.25 MG/3ML nebulizer solution Take 1.25 mg by nebulization every 6 (six) hours as needed for wheezing.  Marland Kitchen levothyroxine (SYNTHROID, LEVOTHROID) 25 MCG tablet Take 37.5 mcg by mouth daily before breakfast.   . Lidocaine (ASPERCREME LIDOCAINE) 4 % PTCH APPLY 1 PATCH TRANSDERMALLY TO LOWER BACK AT BEDTIME  FOR OSTEOARTHRITIS  . loratadine (CLARITIN) 10 MG tablet Take 10 mg by mouth at bedtime.  . Multiple Vitamins-Minerals (MULTIVITAMIN WITH MINERALS) tablet Take 1 tablet by mouth daily.  . naphazoline-glycerin (CLEAR EYES REDNESS RELIEF) 0.012-0.2 % SOLN Place 1 drop into both eyes 2 (two) times daily.  . Nutritional Supplements (ENSURE PO) Take by mouth. ENSURE LIQUID Give 237 ml po daily  . ondansetron (ZOFRAN) 4 MG tablet Take 4 mg by mouth 2 (two) times daily.  . OXYGEN Inhale 2 L into the lungs at bedtime.  . pantoprazole (PROTONIX) 40 MG tablet Take 40 mg by mouth daily.  . polyethylene glycol (MIRALAX / GLYCOLAX) packet Take 17 g by mouth daily.  . predniSONE (DELTASONE) 5 MG tablet Give 5mg  by mouth M - W - F - Sun. with breakfast Dx: COPD  . primidone (MYSOLINE) 50 MG tablet Take 150 mg by mouth every morning.   . primidone (MYSOLINE) 50 MG tablet Take 100 mg by mouth at bedtime.   . sennosides-docusate sodium (SENOKOT-S) 8.6-50 MG tablet Take 1 tablet by mouth 2 (two) times daily.   . traZODone (DESYREL) 50 MG tablet Take 1/2 tab =25 mg by mouth at bedtime  . trihexyphenidyl (ARTANE) 2 MG tablet Take 1 tablet (2 mg total) by mouth every morning.  . umeclidinium bromide (INCRUSE ELLIPTA) 62.5 MCG/INH AEPB Inhale 1 puff into the lungs daily. (SHAKE WELL)   No facility-administered encounter medications on file as of 12/18/2018.     Review of Systems  GENERAL: No change in appetite, no fatigue, no weight changes, no fever, chills or weakness MOUTH and THROAT: Denies oral discomfort, gingival pain or bleeding, pain from teeth or hoarseness   RESPIRATORY: no cough, SOB, DOE, wheezing, hemoptysis CARDIAC: No chest pain, edema or palpitations GI: No abdominal pain, diarrhea, constipation, heart burn, nausea or vomiting GU: Denies dysuria, frequency, hematuria, incontinence, or discharge NEUROLOGICAL: Denies dizziness, syncope, numbness, or headache PSYCHIATRIC: Denies feelings of  depression or anxiety. No report of hallucinations, insomnia, paranoia, or agitation   Immunization History  Administered Date(s) Administered  . Influenza, High Dose Seasonal PF 10/04/2016  . Influenza,inj,Quad PF,6+ Mos 01/13/2016  . Influenza-Unspecified 11/10/2017, 10/09/2018  . Pneumococcal-Unspecified 10/11/2011  . Tdap 01/23/2016   Pertinent  Health Maintenance Due  Topic Date Due  . INFLUENZA VACCINE  Completed  . PNA vac Low Risk Adult  Completed  . DEXA SCAN  Discontinued   Fall Risk  10/24/2018 07/19/2018 01/17/2018 11/09/2017 07/14/2017  Falls in the past year? 0 0 0 No No  Number falls in past yr: 0 0 0 - -  Injury with Fall? 0 0 0 - -  Comment - - - - -  Risk Factor Category  - - - - -  Follow up - - Falls evaluation completed - -     Vitals:   12/18/18 1506  BP: 127/76  Pulse: 70  Resp: 19  Temp: 98.2 F (36.8 C)  TempSrc: Oral  SpO2: 100%  Weight: 144 lb 6.4 oz (65.5 kg)  Height: 5\' 8"  (1.727 m)   Body mass index is 21.96 kg/m.  Physical Exam  GENERAL APPEARANCE: Well nourished. In no acute distress. Normal body habitus SKIN:  Skin is warm and dry.  MOUTH and THROAT: Lips are without lesions. Oral mucosa is moist and without lesions. Tongue is normal in shape, size, and color and without lesions RESPIRATORY: Breathing is even & unlabored, BS CTAB CARDIAC: RRR, no murmur,no extra heart sounds, no edema GI: Abdomen soft, normal BS, no masses, no tenderness EXTREMITIES:  Able to move X 4 extremities NEUROLOGICAL: + tremor. Speech is clear. Alert and oriented X 3. PSYCHIATRIC:  Affect and behavior are appropriate  Labs reviewed: Recent Labs    03/02/18  NA 141  K 4.5  CL 102  CO2 29  BUN 11  CREATININE 1.0  CALCIUM 8.2   No results for input(s): AST, ALT, ALKPHOS, BILITOT, PROT, ALBUMIN in the last 8760 hours. Recent Labs    01/24/18 01/29/18 07/17/18  WBC 8.2 9.0 5.0  NEUTROABS 7 7  --   HGB 9.7* 10.4* 10.8*  HCT 29* 30* 31*  PLT 294 358  223   Lab Results  Component Value Date   TSH 4.17 03/02/2018   Lab Results  Component Value Date   HGBA1C 5.2 12/01/2015   Lab Results  Component Value Date   CHOL 107 12/01/2015   HDL 27 (L) 12/01/2015   LDLCALC 59 12/01/2015   TRIG 107 12/01/2015   CHOLHDL 4.0 12/01/2015    Significant Diagnostic Results in last 30 days:  Xr Lumbar Spine 2-3 Views  Result Date: 12/18/2018 Multilevel spondylosis   Assessment/Plan  1. SVT (supraventricular tachycardia) (HCC) -Rate controlled, continue flecainide  2. Hypothyroidism due to acquired atrophy of thyroid - tsh 5.34, 11/20 -Continue levothyroxine 37.5 mcg daily  3. Simple chronic bronchitis (HCC) -Stable, continue Incruse Ellipta, levalbuterol and prednisone  4. Seasonal allergies -No nasal drainage nor sneezing, continue fluticasone and loratadine  5. Tremor -Has tremors, continue trihexyphenidyl and primidone, follows up with neurology  6. Depression, recurrent ([place]) -Mood is a stable, continue duloxetine and trazodone, followed up by psych NP    Family/ staff Communication: Discussed plan of care with resident.  Labs/tests ordered:  None  Goals of care: Long-term care   Durenda Age, DNP, FNP-BC [facility] [phone] (Monday-Friday 8:00 a.m. - 5:00 p.m.) [phone] (after hours)

## 2018-12-18 NOTE — Progress Notes (Signed)
Office Visit Note   Patient: Dawn [name]           Date of Birth: [birth year]           MRN: [id number] Visit Date: 12/18/2018              Requested by: [name], [place],  [place] [address] PCP: [name], MD   Assessment & Plan: Visit Diagnoses:  1. Chronic right-sided low back pain without sciatica     Plan: Impression is multilevel spondylosis and facet arthropathy.  Dr. Ernestina Patches mentioned that if the patient did not get any relief from the previous facet injections that his next step would be a diagnostic L5 transforaminal injection.  We will refer her to Dr. Ernestina Patches for this.  She will follow-up with [place] as needed.  Follow-Up Instructions: Return if symptoms worsen or fail to improve.   Orders:  Orders Placed This Encounter  Procedures  . XR Lumbar Spine 2-3 Views   No orders of the defined types were placed in this encounter.     Procedures: No procedures performed   Clinical Data: No additional findings.   Subjective: Chief Complaint  Patient presents with  . Right Hip - Pain    HPI Dawn [name] is a pleasant 78 year old female who is a resident of [facility] who presents our clinic today with continued right lower back and buttocks pain.  History of multilevel spondylosis and facet arthropathy.  She was most recently injected by Dr. Ernestina Patches with facet injections at L3-4, L4-5, L5-S1.  She denies any relief of symptoms.  Previous injections at the right SI joint provided temporary relief.  No relief with the previous ESI injection.  She has continued pain to the right lower back and buttocks worse trying to stand up getting in and out of bed as well as when she puts any pressure on the right leg.  She denies any numbness, tingling or burning.  No weakness.  She has been taking Norco with mild relief of symptoms.     Objective: Vital Signs: Ht 5\' 8"  (1.727 m)   Wt 145 lb (65.8 kg)   BMI 22.05 kg/m   Ortho Exam  examination of the lumbar spine reveals increased pain with flexion and rotation to the left.  Positive straight leg raise.  She has mild tenderness over the right SI joint.  No focal weakness.  Specialty Comments:  No specialty comments available.  Imaging: Xr Lumbar Spine 2-3 Views  Result Date: 12/18/2018 Multilevel spondylosis    PMFS History: Patient Active Problem List   Diagnosis Date Noted  . Osteoarthritis 11/01/2018  . Somnolence, daytime 11/01/2018  . Constipation 10/05/2018  . Poor appetite 10/05/2018  . Palliative care patient 08/09/2018  . Hypothyroidism 07/24/2018  . Seasonal allergies 06/30/2018  . Chronic radicular cervical pain 04/26/2018  . Chronic right-sided low back pain with right-sided sciatica 03/21/2018  . Spinal stenosis 11/23/2017  . Headache 08/31/2017  . Elevated TSH 08/31/2017  . Pain in right hip 07/18/2017  . Right-sided low back pain with right-sided sciatica 07/18/2017  . Abnormal chest x-ray 04/04/2017  . Tremor 04/04/2017  . Bimalleolar ankle fracture, left, sequela 10/11/2016  . Altered mental status 10/03/2016  . Neuropathy 10/03/2016  . Chronic diastolic CHF (congestive heart failure) ([place]) 10/03/2016  . Acute lower UTI   . Recurrent falls 02/02/2016  . 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 Upper GI bleed 01/23/2016  . Blood loss  anemia 01/23/2016  . Acute diastolic CHF (congestive heart failure) ([place]) 01/23/2016  . Heme + stool   . Abnormal CT scan, colon   . 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 Hypoxia   . At risk for adverse drug reaction 01/18/2016  . Subcapital fracture of right hip, closed 01/12/2016  . Alteration consciousness 11/30/2015  . SVT (supraventricular tachycardia) ([place]) 11/27/2015  . Diarrhea 11/27/2015  . Depression, recurrent ([place]) 11/27/2015  . Polyneuropathy 11/27/2015  . Renal insufficiency   . Protein-calorie malnutrition, severe 11/18/2015  . Intra-abdominal fluid collection   .  Premature atrial contractions   . Functional diarrhea   . Sepsis ([place])   . Hyperthyroidism   . Hypotension   . Septic shock ([place])   . Acute encephalopathy   . Hypokalemia   . Acute delirium 11/10/2015  . AKI (acute kidney injury) ([place]) 11/10/2015  . Anemia, iron deficiency 11/10/2015  . Anxiety 11/10/2015  . Chronic pain syndrome 11/10/2015  . Recurrent UTI 11/10/2015  . Acute respiratory failure with hypoxia ([place]) 11/10/2015  . COPD (chronic obstructive pulmonary disease) ([place]) 11/10/2015  . Acute hyperglycemia 11/10/2015  . CKD (chronic kidney disease) stage 3, GFR 30-59 ml/min 11/10/2015  . Narrow complex tachycardia ([place]) 11/10/2015  . HTN (hypertension) 11/10/2015  . Fatigue 11/10/2015  . Anemia   . Delirium   . Hypoxemia   . Tachycardia    Past Medical History:  Diagnosis Date  . COPD (chronic obstructive pulmonary disease) ([place])   . History of anxiety   . History of depression   . Spinal stenosis   . UTI (urinary tract infection)     Family History  Problem Relation Age of Onset  . Heart attack Mother   . Diabetes Father   . Breast cancer Sister   . Thyroid disease Neg Hx     Past Surgical History:  Procedure Laterality Date  . ANKLE FRACTURE SURGERY Left   . CATARACT EXTRACTION, BILATERAL    . FLEXIBLE SIGMOIDOSCOPY Left 11/18/2015   Procedure: FLEXIBLE SIGMOIDOSCOPY;  Surgeon: [name], MD;  Location: [facility];  Service: Endoscopy;  Laterality: Left;  . FLEXIBLE SIGMOIDOSCOPY N/A 11/20/2015   Procedure: FLEXIBLE SIGMOIDOSCOPY;  Surgeon: [name], MD;  Location: [facility] ENDOSCOPY;  Service: Endoscopy;  Laterality: N/A;  . HIP ARTHROPLASTY Right 01/12/2016   Procedure: ARTHROPLASTY BIPOLAR HIP (HEMIARTHROPLASTY);  Surgeon: [name], MD;  Location: [facility];  Service: Orthopedics;  Laterality: Right;   Social History   Occupational History  . 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 Smoking status:  Former Smoker    Quit date: 12/11/2015    Years since quitting: 3.0  . Smokeless tobacco: Never Used  . Tobacco comment: Smoked age [age] up to < 1 ppd, usually half a pack per day, mainly one half pack per day  Substance and Sexual Activity  . Alcohol use: No  . Drug use: No  . Sexual activity: Not Currently

## 2018-12-24 NOTE — Progress Notes (Signed)
Called SNF yesterday to confirm meds and took long time for someone to answer and get confirmation of meds (20 min).  Confirmed visit with SNF for today and sent link.  No show to visit today.  Sent multiple links out.  Was also a no show to [facility] on 11/18.

## 2018-12-25 ENCOUNTER — Telehealth: Payer: Self-pay

## 2018-12-25 NOTE — Telephone Encounter (Signed)
Was on the phone for over 20 minutes and transferred 5 times while trying to verify medications for patients 12/16/20202 virtual visit. Discussed how her last visit on 11/28/2018 was a no show and we could not contact them.  The charge nurse advised I call back and speak with the secretary. I called and the line was busy.

## 2018-12-26 ENCOUNTER — Telehealth (INDEPENDENT_AMBULATORY_CARE_PROVIDER_SITE_OTHER): Payer: Medicare Other | Admitting: Neurology

## 2018-12-26 ENCOUNTER — Other Ambulatory Visit: Payer: Self-pay

## 2018-12-26 ENCOUNTER — Telehealth: Payer: Self-pay | Admitting: Neurology

## 2018-12-26 NOTE — Telephone Encounter (Signed)
Already spoke with Neoma Laming and per [facility] she is being rescheduled

## 2018-12-26 NOTE — Telephone Encounter (Signed)
Nurse Sandria Manly was wanting to speak with a nurse in regards to the patient. Please call her back at [phone]. Thanks!

## 2018-12-27 ENCOUNTER — Encounter: Payer: Self-pay | Admitting: Neurology

## 2018-12-27 ENCOUNTER — Other Ambulatory Visit: Payer: Self-pay | Admitting: Adult Health

## 2018-12-27 MED ORDER — HYDROCODONE-ACETAMINOPHEN 5-325 MG PO TABS: ORAL_TABLET | ORAL | 0 refills | Status: DC

## 2018-12-27 NOTE — Progress Notes (Signed)
error 

## 2018-12-28 ENCOUNTER — Telehealth (INDEPENDENT_AMBULATORY_CARE_PROVIDER_SITE_OTHER): Payer: Medicare Other | Admitting: Neurology

## 2018-12-28 ENCOUNTER — Other Ambulatory Visit: Payer: Self-pay

## 2018-12-28 ENCOUNTER — Encounter: Payer: Self-pay | Admitting: Neurology

## 2018-12-28 ENCOUNTER — Telehealth: Payer: Self-pay | Admitting: Neurology

## 2018-12-28 NOTE — Telephone Encounter (Signed)
Pt had video visit scheduled today.  [facility] called yesterday and confirmed visit.  Link sent 20 min prior to visit and NR.  Link resent 5 min prior to visit and NR.  20 min after visit, [facility] called and said that they were linked on.  I asked my MA to link to the video and no one was there.  [facility] then said that "they were having problems with their tablet and aren't on the link."  Pt has NS video visits on 11/18, 12/16 and today.

## 2019-01-01 ENCOUNTER — Non-Acute Institutional Stay (SKILLED_NURSING_FACILITY): Payer: Medicare Other | Admitting: Internal Medicine

## 2019-01-01 ENCOUNTER — Encounter: Payer: Self-pay | Admitting: Internal Medicine

## 2019-01-01 DIAGNOSIS — M5412 Radiculopathy, cervical region: Secondary | ICD-10-CM

## 2019-01-01 DIAGNOSIS — J449 Chronic obstructive pulmonary disease, unspecified: Secondary | ICD-10-CM | POA: Diagnosis not present

## 2019-01-01 DIAGNOSIS — G8929 Other chronic pain: Secondary | ICD-10-CM

## 2019-01-01 DIAGNOSIS — I5032 Chronic diastolic (congestive) heart failure: Secondary | ICD-10-CM

## 2019-01-01 DIAGNOSIS — R5383 Other fatigue: Secondary | ICD-10-CM

## 2019-01-01 NOTE — Assessment & Plan Note (Addendum)
See 01/01/2019: she describes paroxysmal nocturnal dyspnea dyspnea at rest.  She also has an irregular rhythm on exam and persistent edema despite support hose.  O2 sats on submental oxygen have been 92% - 98% consistently without hypoxia. BNP will be ordered and low dose diuretic added.

## 2019-01-01 NOTE — Progress Notes (Signed)
NURSING HOME LOCATION:  [facility] ROOM NUMBER:  [id number]  CODE STATUS:  Full Code  PCP: Pascal Lux, MD  This is a nursing facility follow up of chronic medical diagnoses  Interim medical record and care since last [place] visit was updated with review of diagnostic studies and change in clinical status since last visit were documented.  HPI: Originally the [facility] NP asked me to evaluate the patient's neck pain.  Actually she has multiple complaints which is usually the case. She describes shortness of breath "for a long time", for at least 3 months for which she has been requesting increased frequency of nebulizer treatments.  These were ordered every 6 hours.  She has the shortness of breath both at rest and with exertion.  She states that she will frequently wake up at 2-3 AM because of shortness of breath and she keeps the head of her bed elevated.  She has peripheral edema for which she is wearing support hose.  She states that she does notice her heart to be irregular at times.  She has a past medical history of paroxysmal supraventricular tachycardia as well as frequent PACs and junctional tachycardia.  Additionally she has a history of chronic diastolic congestive heart failure. She also describes watery eyes and intermittent green sputum.  She has no definite Covid symptoms and denies other extrinsic upper or lower respiratory tract symptoms. Review of vital signs indicates that her O2 sats have ranged from a low of 92% up to 98% on supplemental oxygen.  Review of systems: She describes poor appetite and lack of energy. She is upset as her neurology follow-up of her tremor has been rescheduled several times. She describes right posterior cervical neck pain with radiation over the crown.This is a chronic ,recurrent problem responsive to [place] through PT/OT. (see 04/26/2018)  Constitutional: No fever, significant weight change, fatigue  Eyes: No redness, discharge, pain,  vision change ENT/mouth: No nasal congestion,  purulent discharge, earache, change in hearing, sore throat  Cardiovascular: No chest pain, palpitations, paroxysmal nocturnal dyspnea, claudication, edema  Respiratory: No cough, sputum production, hemoptysis, DOE, significant snoring, apnea   Gastrointestinal: No heartburn, dysphagia, abdominal pain, nausea /vomiting, rectal bleeding, melena, change in bowels Genitourinary: No dysuria, hematuria, pyuria, incontinence, nocturia Musculoskeletal: No joint stiffness, joint swelling, weakness, pain Dermatologic: No rash, pruritus, change in appearance of skin Neurologic: No dizziness, headache, syncope, seizures, numbness, tingling Psychiatric: No significant anxiety, depression, insomnia, anorexia Endocrine: No change in hair/skin/nails, excessive thirst, excessive hunger, excessive urination  Hematologic/lymphatic: No significant bruising, lymphadenopathy, abnormal bleeding Allergy/immunology: No itchy/watery eyes, significant sneezing, urticaria, angioedema  Physical exam:  Pertinent or positive findings: She is thin and appears chronically ill.  She has a resting tremor of the mandible and also of both hands.  He is wearing nasal oxygen.  She has an upper partial; she is not wearing the lower plate.  Breath sounds are markedly decreased.  Heart rhythm is irregular.  The heart sounds are not distanced or loudest in the epigastric area as would be expected with advanced COPD.  Abdomen is protuberant.  She has 1/2+ pitting edema despite wearing support hose.  Pedal pulses are decreased.  She is diffusely weak to opposition. General appearance: Adequately nourished; no acute distress, increased work of breathing is present.   Lymphatic: No lymphadenopathy about the head, neck, axilla. Eyes: No conjunctival inflammation or lid edema is present. There is no scleral icterus. Ears:  External ear exam shows no significant lesions  or deformities.   Nose:   External nasal examination shows no deformity or inflammation. Nasal mucosa are pink and moist without lesions, exudates Oral exam:  Lips and gums are healthy appearing. There is no oropharyngeal erythema or exudate. Neck:  No thyromegaly, masses, tenderness noted.    Heart:  Normal rate and regular rhythm. S1 and S2 normal without gallop, murmur, click, rub .  Lungs: Chest clear to auscultation without wheezes, rhonchi, rales, rubs. Abdomen: Bowel sounds are normal. Abdomen is soft and nontender with no organomegaly, hernias, masses. GU: Deferred  Extremities:  No cyanosis, clubbing, edema  Neurologic exam : Cn 2-7 intact Strength equal  in upper & lower extremities Balance, [place], finger to nose testing could not be completed due to clinical state Deep tendon reflexes are equal Skin: Warm & dry w/o tenting. No significant lesions or rash.  See summary under each active problem in the Problem List with associated updated therapeutic plan

## 2019-01-01 NOTE — Assessment & Plan Note (Addendum)
See 01/01/2019 symptoms have recurred.  She seems to indicate that PT has been of benefit but this has been interrupted.  NSAIDs are contraindicated. Monitor response to daily oral steroids

## 2019-01-01 NOTE — Assessment & Plan Note (Addendum)
The excellent O2 sats & symptoms of paroxysmal nocturnal dyspnea and persistent peripheral edema suggest cardiac rather than COPD exacerbation as cause of her dyspnea. Excessive nebulizer treatments could exacerbate dysrhythmias.

## 2019-01-02 ENCOUNTER — Telehealth: Payer: Self-pay | Admitting: Neurology

## 2019-01-02 ENCOUNTER — Encounter: Payer: Self-pay | Admitting: Internal Medicine

## 2019-01-02 NOTE — Telephone Encounter (Signed)
Patient dismissed from [facility] by Wells Guiles Tat, DO, effective 12/28/18. Dismissal Letter sent out by 1st class mail. [employer]

## 2019-01-02 NOTE — Patient Instructions (Signed)
See assessment and plan under each diagnosis in the problem list and acutely for this visit 

## 2019-01-05 LAB — BASIC METABOLIC PANEL
BUN: 12 (ref 4–21)
CO2: 23 — AB (ref 13–22)
Chloride: 96 — AB (ref 99–108)
Creatinine: 0.9 (ref <=1.1)
Glucose: 101
Potassium: 4.6 (ref 3.4–5.3)
Sodium: 135 — AB (ref 137–147)

## 2019-01-05 LAB — TSH: TSH: 5.92 — AB (ref <=5.90)

## 2019-01-05 LAB — COMPREHENSIVE METABOLIC PANEL
Calcium: 9.2 (ref 8.7–10.7)
GFR calc Af Amer: 70.46
GFR calc non Af Amer: 60.8

## 2019-01-09 LAB — CBC AND DIFFERENTIAL
HCT: 29 — AB (ref 36–46)
Hemoglobin: 9.7 — AB (ref 12.0–16.0)
Platelets: 236 (ref 150–399)
WBC: 5

## 2019-01-09 LAB — TSH: TSH: 4.6 (ref 0.41–5.90)

## 2019-01-09 LAB — CBC: RBC: 3.09 — AB (ref 3.87–5.11)

## 2019-01-11 DIAGNOSIS — M6281 Muscle weakness (generalized): Secondary | ICD-10-CM | POA: Diagnosis not present

## 2019-01-11 DIAGNOSIS — J9601 Acute respiratory failure with hypoxia: Secondary | ICD-10-CM | POA: Diagnosis not present

## 2019-01-11 DIAGNOSIS — R1312 Dysphagia, oropharyngeal phase: Secondary | ICD-10-CM | POA: Diagnosis not present

## 2019-01-11 DIAGNOSIS — G629 Polyneuropathy, unspecified: Secondary | ICD-10-CM | POA: Diagnosis not present

## 2019-01-14 DIAGNOSIS — M6281 Muscle weakness (generalized): Secondary | ICD-10-CM | POA: Diagnosis not present

## 2019-01-14 DIAGNOSIS — R1312 Dysphagia, oropharyngeal phase: Secondary | ICD-10-CM | POA: Diagnosis not present

## 2019-01-14 DIAGNOSIS — J9601 Acute respiratory failure with hypoxia: Secondary | ICD-10-CM | POA: Diagnosis not present

## 2019-01-14 DIAGNOSIS — G629 Polyneuropathy, unspecified: Secondary | ICD-10-CM | POA: Diagnosis not present

## 2019-01-15 ENCOUNTER — Non-Acute Institutional Stay (SKILLED_NURSING_FACILITY): Payer: Medicare Other | Admitting: Adult Health

## 2019-01-15 ENCOUNTER — Encounter: Payer: Self-pay | Admitting: Adult Health

## 2019-01-15 DIAGNOSIS — J449 Chronic obstructive pulmonary disease, unspecified: Secondary | ICD-10-CM

## 2019-01-15 DIAGNOSIS — E034 Atrophy of thyroid (acquired): Secondary | ICD-10-CM

## 2019-01-15 DIAGNOSIS — R251 Tremor, unspecified: Secondary | ICD-10-CM | POA: Diagnosis not present

## 2019-01-15 DIAGNOSIS — I471 Supraventricular tachycardia: Secondary | ICD-10-CM

## 2019-01-15 DIAGNOSIS — K5901 Slow transit constipation: Secondary | ICD-10-CM

## 2019-01-15 DIAGNOSIS — F339 Major depressive disorder, recurrent, unspecified: Secondary | ICD-10-CM

## 2019-01-15 NOTE — Progress Notes (Signed)
Location:  [facility] Room Number: 202/A Place of Service:  SNF (31) Provider:  Durenda Age, DNP, FNP-BC  Patient Care Team: [name], MD as PCP - General (Internal Medicine) Medina-Vargas, [name], NP as Nurse Practitioner (Internal Medicine)  Extended Emergency Contact Information Primary Emergency Contact: Hatfield,Lisa Address: [address]          [place], [place] [address] [place] Work Phone: [phone] Mobile Phone: [phone] Relation: Daughter  Code Status:  Full Code  Goals of care: Advanced Directive information Advanced Directives 01/15/2019  Does Patient Have a Medical Advance Directive? Yes  Type of Advance Directive (No Data)  Does patient want to make changes to medical advance directive? No - Patient declined  Would patient like information on creating a medical advance directive? -     Chief Complaint  Patient presents with  . Medical Management of Chronic Issues    Routine visit of medical management    HPI:  Pt is a 79 y.o. female seen today for medical management of chronic diseases. She has PMH of COPD, spinal stenosis, depression and anxiety. She had a recent fall and when asked about it, she said that she forgot to lock her wheelchair brake while trying to fix her drawer.   She complained of pain on her hip. X-ray showed negative right hip fracture. She continues to use PRN Norco and Gabapentin for chronic pain. She takes PRN Levalbuterol, Prednisone and Incruise for COPD. She denies having SOB. She complained of constipation today.   Past Medical History:  Diagnosis Date  . COPD (chronic obstructive pulmonary disease) ([place])   . History of anxiety   . History of depression   . Spinal stenosis   . UTI (urinary tract infection)    Past Surgical History:  Procedure Laterality Date  . ANKLE FRACTURE SURGERY Left   . CATARACT EXTRACTION, BILATERAL    . FLEXIBLE SIGMOIDOSCOPY Left 11/18/2015     Procedure: FLEXIBLE SIGMOIDOSCOPY;  Surgeon: [name], MD;  Location: [facility];  Service: Endoscopy;  Laterality: Left;  . FLEXIBLE SIGMOIDOSCOPY N/A 11/20/2015   Procedure: FLEXIBLE SIGMOIDOSCOPY;  Surgeon: [name], MD;  Location: [facility] END[facility]COPY;  Service: Endoscopy;  Laterality: N/A;  . HIP ARTHROPLASTY Right 01/12/2016   Procedure: ARTHROPLASTY BIPOLAR HIP (HEMIARTHROPLASTY);  Surgeon: [name], MD;  Location: [facility];  Service: Orthopedics;  Laterality: Right;    Allergies  Allergen Reactions  . Biofreeze [Menthol (Topical Analgesic)] Rash    By history Aspercreme does not cause a rash    Outpatient Encounter Medications as of 01/15/2019  Medication Sig  . acetaminophen (TYLENOL) 325 MG tablet Take 650 mg by mouth every 6 (six) hours as needed.  . Alum & Mag Hydroxide-Simeth (MYLANTA MAXIMUM STRENGTH PO) Give 30cc PO q2H PRN x 24H (call MD if indigestion is not relieved w/i 24H  . aspirin 81 MG chewable tablet Chew 81 mg by mouth daily.   . digoxin (LANOXIN) 0.125 MG tablet Take 0.125 mg by mouth daily. Hold if HR <60  . DULoxetine (CYMBALTA) 30 MG capsule Take 90 mg by mouth daily.  Marland Kitchen FLECAINIDE ACETATE PO Take 50 mg by mouth every 12 (twelve) hours.  . fluticasone (FLONASE ALLERGY RELIEF) 50 MCG/ACT nasal spray Place 1 spray into both nostrils daily.  . furosemide (LASIX) 20 MG tablet Take 20 mg by mouth daily. For CHF  . gabapentin (NEURONTIN) 100 MG capsule Take 2 capsules = 200 mg by mouth twice a day  .  HYDROcodone-acetaminophen (NORCO/VICODIN) 5-325 MG tablet Give **1/2 tablet by mouth q 6 hrs PRN  . levalbuterol (XOPENEX) 1.25 MG/3ML nebulizer solution Take 1.25 mg by nebulization every 6 (six) hours as needed for wheezing.  Marland Kitchen levothyroxine (SYNTHROID, LEVOTHROID) 25 MCG tablet Take 50 mcg by mouth daily before breakfast. FOR HYPOTHYROIDISM  . Lidocaine (ASPERCREME LIDOCAINE) 4 % PTCH APPLY 1 PATCH TRANSDERMALLY TO LOWER BACK AT BEDTIME FOR OSTEOARTHRITIS  .  loratadine (CLARITIN) 10 MG tablet Take 10 mg by mouth at bedtime.  . Multiple Vitamins-Minerals (MULTIVITAMIN WITH MINERALS) tablet Take 1 tablet by mouth daily.  . naphazoline-glycerin (CLEAR EYES REDNESS RELIEF) 0.012-0.2 % SOLN Place 1 drop into both eyes 2 (two) times daily.  . Nutritional Supplements (ENSURE PO) Take by mouth. ENSURE LIQUID Give 237 ml po daily  . ondansetron (ZOFRAN) 4 MG tablet Take 4 mg by mouth 2 (two) times daily.  . OXYGEN Inhale 2 L into the lungs at bedtime.  . pantoprazole (PROTONIX) 40 MG tablet Take 40 mg by mouth daily.  . polyethylene glycol (MIRALAX / GLYCOLAX) packet Take 17 g by mouth daily.  . predniSONE (DELTASONE) 5 MG tablet TAKE 1 TABLET BY MOUTH DAILY (WITH BREAKFAST) XT:GGYI  . primidone (MYSOLINE) 50 MG tablet Take 150 mg by mouth every morning.   . primidone (MYSOLINE) 50 MG tablet Take 100 mg by mouth at bedtime.   . sennosides-docusate sodium (SENOKOT-S) 8.6-50 MG tablet Take 1 tablet by mouth 2 (two) times daily.   . traZODone (DESYREL) 50 MG tablet Take 1/2 tab =25 mg by mouth at bedtime  . trihexyphenidyl (ARTANE) 2 MG tablet Take 1 tablet (2 mg total) by mouth every morning.  . umeclidinium bromide (INCRUSE ELLIPTA) 62.5 MCG/INH AEPB Inhale 1 puff into the lungs daily. (SHAKE WELL)  . [DISCONTINUED] alum & mag hydroxide-simeth (MAALOX PLUS) 400-400-40 MG/5ML suspension Give 30cc PO q2H PRN x 24H (call MD if indigestion is not relieved w/i 24H)   No facility-administered encounter medications on file as of 01/15/2019.    Review of Systems  GENERAL: No change in appetite, no fatigue, no weight changes, no fever, chills or weakness MOUTH and THROAT: Denies oral discomfort, gingival pain or bleeding, pain from teeth or hoarseness   RESPIRATORY: no cough, SOB, DOE, wheezing, hemoptysis CARDIAC: No chest pain, edema or palpitations GI: +constipation GU: Denies dysuria, frequency, hematuria, incontinence, or discharge NEUROLOGICAL: Denies  dizziness, syncope, numbness, or headache PSYCHIATRIC: Denies feelings of depression or anxiety. No report of hallucinations, insomnia, paranoia, or agitation   Immunization History  Administered Date(s) Administered  . Influenza, High Dose Seasonal PF 10/04/2016  . Influenza,inj,Quad PF,6+ Mos 01/13/2016  . Influenza-Unspecified 11/10/2017, 10/09/2018  . Pneumococcal-Unspecified 10/11/2011  . Tdap 01/23/2016   Pertinent  Health Maintenance Due  Topic Date Due  . INFLUENZA VACCINE  Completed  . PNA vac Low Risk Adult  Completed  . DEXA SCAN  Discontinued   Fall Risk  10/24/2018 07/19/2018 01/17/2018 11/09/2017 07/14/2017  Falls in the past year? 0 0 0 No No  Number falls in past yr: 0 0 0 - -  Injury with Fall? 0 0 0 - -  Comment - - - - -  Risk Factor Category  - - - - -  Follow up - - Falls evaluation completed - -     Vitals:   01/15/19 1024  BP: (!) 108/56  Pulse: 67  Resp: 19  Temp: (!) 96.6 F (35.9 C)  TempSrc: Oral  SpO2: 95%  Weight:  144 lb 6.4 oz (65.5 kg)  Height: 5\' 8"  (1.727 m)   Body mass index is 21.96 kg/m.  Physical Exam  GENERAL APPEARANCE: Well nourished. In no acute distress. Normal body habitus SKIN:  Skin is warm and dry.  MOUTH and THROAT: Lips are without lesions. Oral mucosa is moist and without lesions. 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 Speech is clear. Alert and oriented X 3. PSYCHIATRIC:  Affect and behavior are appropriate  Labs reviewed: Recent Labs    03/02/18 0000  NA 141  K 4.5  CL 102  CO2 29  BUN 11  CREATININE 1.0  CALCIUM 8.2    Recent Labs    01/24/18 0000 01/29/18 0000 07/17/18 0000  WBC  8.2 9.0 5.0  NEUTROABS 7 7  --   HGB 9.7* 10.4* 10.8*  HCT 29* 30* 31*  PLT 294 358 223   Lab Results  Component Value Date   TSH 4.17 03/02/2018   Lab Results  Component Value Date   HGBA1C 5.2 12/01/2015   Lab Results  Component Value Date   CHOL 107 12/01/2015   HDL 27 (L) 12/01/2015   LDLCALC 59 12/01/2015   TRIG 107 12/01/2015   CHOLHDL 4.0 12/01/2015    Significant Diagnostic Results in last 30 days:  XR Lumbar Spine 2-3 Views  Result Date: 12/18/2018 Multilevel spondylosis   Assessment/Plan  1. 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 Tremor -Continue primidone and trihexyphenidyl, for follow-up neurology  4. Depression, recurrent ([place]) -Mood is stable, continue duloxetine, followed up by psych NP  5. Hypothyroidism due to acquired atrophy of thyroid Lab Results  Component Value Date   TSH 4.17 03/02/2018  -Continue levothyroxine  6. Constipation - complains of constipation, continue Senna - S 8.6-50 mg 1 tab bid and will change PRN Miralax to daily   Family/ staff Communication:  Discussed plan of care with resident.  Labs/tests ordered:  None  Goals of care:   Long-term care   Durenda Age, DNP, FNP-BC [facility] [phone] (Monday-Friday 8:00 a.m. - 5:00 p.m.) [phone] (after hours)

## 2019-01-16 DIAGNOSIS — R1312 Dysphagia, oropharyngeal phase: Secondary | ICD-10-CM | POA: Diagnosis not present

## 2019-01-16 DIAGNOSIS — G629 Polyneuropathy, unspecified: Secondary | ICD-10-CM | POA: Diagnosis not present

## 2019-01-16 DIAGNOSIS — J9601 Acute respiratory failure with hypoxia: Secondary | ICD-10-CM | POA: Diagnosis not present

## 2019-01-16 DIAGNOSIS — M6281 Muscle weakness (generalized): Secondary | ICD-10-CM | POA: Diagnosis not present

## 2019-01-17 DIAGNOSIS — G629 Polyneuropathy, unspecified: Secondary | ICD-10-CM | POA: Diagnosis not present

## 2019-01-17 DIAGNOSIS — J9601 Acute respiratory failure with hypoxia: Secondary | ICD-10-CM | POA: Diagnosis not present

## 2019-01-17 DIAGNOSIS — R1312 Dysphagia, oropharyngeal phase: Secondary | ICD-10-CM | POA: Diagnosis not present

## 2019-01-17 DIAGNOSIS — M6281 Muscle weakness (generalized): Secondary | ICD-10-CM | POA: Diagnosis not present

## 2019-01-18 DIAGNOSIS — G629 Polyneuropathy, unspecified: Secondary | ICD-10-CM | POA: Diagnosis not present

## 2019-01-18 DIAGNOSIS — J9601 Acute respiratory failure with hypoxia: Secondary | ICD-10-CM | POA: Diagnosis not present

## 2019-01-18 DIAGNOSIS — M6281 Muscle weakness (generalized): Secondary | ICD-10-CM | POA: Diagnosis not present

## 2019-01-18 DIAGNOSIS — R1312 Dysphagia, oropharyngeal phase: Secondary | ICD-10-CM | POA: Diagnosis not present

## 2019-01-21 DIAGNOSIS — G629 Polyneuropathy, unspecified: Secondary | ICD-10-CM | POA: Diagnosis not present

## 2019-01-21 DIAGNOSIS — J9601 Acute respiratory failure with hypoxia: Secondary | ICD-10-CM | POA: Diagnosis not present

## 2019-01-21 DIAGNOSIS — R1312 Dysphagia, oropharyngeal phase: Secondary | ICD-10-CM | POA: Diagnosis not present

## 2019-01-21 DIAGNOSIS — F4321 Adjustment disorder with depressed mood: Secondary | ICD-10-CM | POA: Diagnosis not present

## 2019-01-21 DIAGNOSIS — M6281 Muscle weakness (generalized): Secondary | ICD-10-CM | POA: Diagnosis not present

## 2019-01-22 DIAGNOSIS — G629 Polyneuropathy, unspecified: Secondary | ICD-10-CM | POA: Diagnosis not present

## 2019-01-22 DIAGNOSIS — R1312 Dysphagia, oropharyngeal phase: Secondary | ICD-10-CM | POA: Diagnosis not present

## 2019-01-22 DIAGNOSIS — J9601 Acute respiratory failure with hypoxia: Secondary | ICD-10-CM | POA: Diagnosis not present

## 2019-01-22 DIAGNOSIS — M6281 Muscle weakness (generalized): Secondary | ICD-10-CM | POA: Diagnosis not present

## 2019-01-23 ENCOUNTER — Other Ambulatory Visit: Payer: Self-pay | Admitting: Adult Health

## 2019-01-23 DIAGNOSIS — J9601 Acute respiratory failure with hypoxia: Secondary | ICD-10-CM | POA: Diagnosis not present

## 2019-01-23 DIAGNOSIS — R1312 Dysphagia, oropharyngeal phase: Secondary | ICD-10-CM | POA: Diagnosis not present

## 2019-01-23 DIAGNOSIS — M6281 Muscle weakness (generalized): Secondary | ICD-10-CM | POA: Diagnosis not present

## 2019-01-23 DIAGNOSIS — G629 Polyneuropathy, unspecified: Secondary | ICD-10-CM | POA: Diagnosis not present

## 2019-01-23 MED ORDER — HYDROCODONE-ACETAMINOPHEN 5-325 MG PO TABS: ORAL_TABLET | ORAL | 0 refills | Status: DC

## 2019-01-24 DIAGNOSIS — J9601 Acute respiratory failure with hypoxia: Secondary | ICD-10-CM | POA: Diagnosis not present

## 2019-01-24 DIAGNOSIS — R1312 Dysphagia, oropharyngeal phase: Secondary | ICD-10-CM | POA: Diagnosis not present

## 2019-01-24 DIAGNOSIS — G629 Polyneuropathy, unspecified: Secondary | ICD-10-CM | POA: Diagnosis not present

## 2019-01-24 DIAGNOSIS — M6281 Muscle weakness (generalized): Secondary | ICD-10-CM | POA: Diagnosis not present

## 2019-01-25 DIAGNOSIS — G629 Polyneuropathy, unspecified: Secondary | ICD-10-CM | POA: Diagnosis not present

## 2019-01-25 DIAGNOSIS — M6281 Muscle weakness (generalized): Secondary | ICD-10-CM | POA: Diagnosis not present

## 2019-01-25 DIAGNOSIS — J9601 Acute respiratory failure with hypoxia: Secondary | ICD-10-CM | POA: Diagnosis not present

## 2019-01-25 DIAGNOSIS — R1312 Dysphagia, oropharyngeal phase: Secondary | ICD-10-CM | POA: Diagnosis not present

## 2019-01-28 DIAGNOSIS — G629 Polyneuropathy, unspecified: Secondary | ICD-10-CM | POA: Diagnosis not present

## 2019-01-28 DIAGNOSIS — Z23 Encounter for immunization: Secondary | ICD-10-CM | POA: Diagnosis not present

## 2019-01-28 DIAGNOSIS — M6281 Muscle weakness (generalized): Secondary | ICD-10-CM | POA: Diagnosis not present

## 2019-01-28 DIAGNOSIS — R1312 Dysphagia, oropharyngeal phase: Secondary | ICD-10-CM | POA: Diagnosis not present

## 2019-01-28 DIAGNOSIS — J9601 Acute respiratory failure with hypoxia: Secondary | ICD-10-CM | POA: Diagnosis not present

## 2019-01-28 DIAGNOSIS — F4321 Adjustment disorder with depressed mood: Secondary | ICD-10-CM | POA: Diagnosis not present

## 2019-01-29 DIAGNOSIS — R1312 Dysphagia, oropharyngeal phase: Secondary | ICD-10-CM | POA: Diagnosis not present

## 2019-01-29 DIAGNOSIS — J9601 Acute respiratory failure with hypoxia: Secondary | ICD-10-CM | POA: Diagnosis not present

## 2019-01-29 DIAGNOSIS — M6281 Muscle weakness (generalized): Secondary | ICD-10-CM | POA: Diagnosis not present

## 2019-01-29 DIAGNOSIS — G629 Polyneuropathy, unspecified: Secondary | ICD-10-CM | POA: Diagnosis not present

## 2019-01-30 DIAGNOSIS — J9601 Acute respiratory failure with hypoxia: Secondary | ICD-10-CM | POA: Diagnosis not present

## 2019-01-30 DIAGNOSIS — G629 Polyneuropathy, unspecified: Secondary | ICD-10-CM | POA: Diagnosis not present

## 2019-01-30 DIAGNOSIS — R1312 Dysphagia, oropharyngeal phase: Secondary | ICD-10-CM | POA: Diagnosis not present

## 2019-01-30 DIAGNOSIS — M6281 Muscle weakness (generalized): Secondary | ICD-10-CM | POA: Diagnosis not present

## 2019-01-31 DIAGNOSIS — G629 Polyneuropathy, unspecified: Secondary | ICD-10-CM | POA: Diagnosis not present

## 2019-01-31 DIAGNOSIS — J9601 Acute respiratory failure with hypoxia: Secondary | ICD-10-CM | POA: Diagnosis not present

## 2019-01-31 DIAGNOSIS — M6281 Muscle weakness (generalized): Secondary | ICD-10-CM | POA: Diagnosis not present

## 2019-01-31 DIAGNOSIS — R1312 Dysphagia, oropharyngeal phase: Secondary | ICD-10-CM | POA: Diagnosis not present

## 2019-02-01 DIAGNOSIS — M6281 Muscle weakness (generalized): Secondary | ICD-10-CM | POA: Diagnosis not present

## 2019-02-01 DIAGNOSIS — J9601 Acute respiratory failure with hypoxia: Secondary | ICD-10-CM | POA: Diagnosis not present

## 2019-02-01 DIAGNOSIS — G629 Polyneuropathy, unspecified: Secondary | ICD-10-CM | POA: Diagnosis not present

## 2019-02-01 DIAGNOSIS — R1312 Dysphagia, oropharyngeal phase: Secondary | ICD-10-CM | POA: Diagnosis not present

## 2019-02-05 ENCOUNTER — Encounter: Payer: Self-pay | Admitting: Adult Health

## 2019-02-05 ENCOUNTER — Non-Acute Institutional Stay (SKILLED_NURSING_FACILITY): Payer: Medicare Other | Admitting: Adult Health

## 2019-02-05 DIAGNOSIS — J449 Chronic obstructive pulmonary disease, unspecified: Secondary | ICD-10-CM | POA: Diagnosis not present

## 2019-02-05 DIAGNOSIS — J309 Allergic rhinitis, unspecified: Secondary | ICD-10-CM | POA: Diagnosis not present

## 2019-02-05 DIAGNOSIS — J9601 Acute respiratory failure with hypoxia: Secondary | ICD-10-CM | POA: Diagnosis not present

## 2019-02-05 DIAGNOSIS — R251 Tremor, unspecified: Secondary | ICD-10-CM | POA: Diagnosis not present

## 2019-02-05 DIAGNOSIS — R1312 Dysphagia, oropharyngeal phase: Secondary | ICD-10-CM | POA: Diagnosis not present

## 2019-02-05 DIAGNOSIS — I471 Supraventricular tachycardia: Secondary | ICD-10-CM

## 2019-02-05 DIAGNOSIS — I5032 Chronic diastolic (congestive) heart failure: Secondary | ICD-10-CM

## 2019-02-05 DIAGNOSIS — G894 Chronic pain syndrome: Secondary | ICD-10-CM | POA: Diagnosis not present

## 2019-02-05 DIAGNOSIS — G629 Polyneuropathy, unspecified: Secondary | ICD-10-CM | POA: Diagnosis not present

## 2019-02-05 DIAGNOSIS — M6281 Muscle weakness (generalized): Secondary | ICD-10-CM | POA: Diagnosis not present

## 2019-02-05 NOTE — Progress Notes (Signed)
Location:  [facility] Room Number: 202/A Place of Service:  SNF (31) Provider:  Durenda Age, DNP, FNP-BC  Patient Care Team: [name], MD as PCP - General (Internal Medicine) Medina-Vargas, [name], NP as Nurse Practitioner (Internal Medicine)  Extended Emergency Contact Information Primary Emergency Contact: Hatfield,Lisa Address: [address]          [place], [place] [address] [place] Work Phone: [phone] Mobile Phone: [phone] Relation: Daughter  Code Status:  Full Code  Goals of care: Advanced Directive information Advanced Directives 02/05/2019  Does Patient Have a Medical Advance Directive? Yes  Type of Advance Directive (No Data)  Does patient want to make changes to medical advance directive? No - Patient declined  Would patient like information on creating a medical advance directive? -     Chief Complaint  Patient presents with  . Medical Management of Chronic Issues    Routine visit of medical management    HPI:  Pt is a 79 y.o. female seen today for medical management of chronic diseases.  She has PMH of COPD, spinal stenosis, depression and anxiety.She is 9th-day post Moderna COVID-19 vaccination. No side effects noted. She had a fall incident on 01/14/19 wherein she forgot to lock her wheelchair. She has been reminded by staffs to lock her wheelchair several times. She complains of tremors on both hands.  She takes Primidone and Trihexyphenidyl for tremors. Sometimes she does not put on her oxygen. Staff reminds her to put it on. No reported SOB nor wheezing. She takes Incruise Ellipta and PRN Levalbuterol for COPD.    Past Medical History:  Diagnosis Date  . COPD (chronic obstructive pulmonary disease) ([place])   . History of anxiety   . History of depression   . Spinal stenosis   . UTI (urinary tract infection)    Past Surgical History:  Procedure Laterality Date  . ANKLE FRACTURE SURGERY Left    . CATARACT EXTRACTION, BILATERAL    . FLEXIBLE SIGMOIDOSCOPY Left 11/18/2015   Procedure: FLEXIBLE SIGMOIDOSCOPY;  Surgeon: [name], MD;  Location: [facility];  Service: Endoscopy;  Laterality: Left;  . FLEXIBLE SIGMOIDOSCOPY N/A 11/20/2015   Procedure: FLEXIBLE SIGMOIDOSCOPY;  Surgeon: [name], MD;  Location: [facility] ENDOSCOPY;  Service: Endoscopy;  Laterality: N/A;  . HIP ARTHROPLASTY Right 01/12/2016   Procedure: ARTHROPLASTY BIPOLAR HIP (HEMIARTHROPLASTY);  Surgeon: [name], MD;  Location: [facility];  Service: Orthopedics;  Laterality: Right;    Allergies  Allergen Reactions  . Biofreeze [Menthol (Topical Analgesic)] Rash    By history Aspercreme does not cause a rash    Outpatient Encounter Medications as of 02/05/2019  Medication Sig  . acetaminophen (TYLENOL) 325 MG tablet Take 650 mg by mouth every 6 (six) hours as needed.  Marland Kitchen aspirin 81 MG chewable tablet Chew 81 mg by mouth daily.   . digoxin (LANOXIN) 0.125 MG tablet Take 0.125 mg by mouth daily. Hold if HR <60  . DULoxetine (CYMBALTA) 30 MG capsule TAKE 1 CAPSULE BY MOUTH ONCE DAILY FOR DEPRESSION/PAIN (TAKE WITH 60MG  TO=90MG ) (DO NOT CRUSH)  . DULoxetine (CYMBALTA) 60 MG capsule TAKE 1 CAPSULE BY MOUTH ONCE DAILY FOR DEPRESSION/PAIN (TAKE WITH 30MG  TO=90MG ) (DO NOT CRUSH)  . FLECAINIDE ACETATE PO Take 50 mg by mouth every 12 (twelve) hours.  . fluticasone (FLONASE ALLERGY RELIEF) 50 MCG/ACT nasal spray Place 1 spray into both nostrils daily.  . furosemide (LASIX) 20 MG tablet Take 20 mg by mouth daily. For CHF  .  gabapentin (NEURONTIN) 100 MG capsule Take 2 capsules = 200 mg by mouth twice a day  . HYDROcodone-acetaminophen (NORCO/VICODIN) 5-325 MG tablet Give **1/2 tablet by mouth q 6 hrs PRN  . levalbuterol (XOPENEX) 1.25 MG/3ML nebulizer solution Take 1.25 mg by nebulization every 6 (six) hours as needed for wheezing.  Marland Kitchen levothyroxine (SYNTHROID, LEVOTHROID) 25 MCG tablet Take 50 mcg by mouth daily before breakfast. FOR  HYPOTHYROIDISM  . Lidocaine (ASPERCREME LIDOCAINE) 4 % PTCH APPLY 1 PATCH TRANSDERMALLY TO LOWER BACK AT BEDTIME FOR OSTEOARTHRITIS  . loratadine (CLARITIN) 10 MG tablet Take 10 mg by mouth at bedtime.  . Multiple Vitamins-Minerals (MULTIVITAMIN WITH MINERALS) tablet Take 1 tablet by mouth daily.  . naphazoline-glycerin (CLEAR EYES REDNESS RELIEF) 0.012-0.2 % SOLN Place 1 drop into both eyes 2 (two) times daily.  . Nutritional Supplements (ENSURE PO) Take by mouth. ENSURE LIQUID Give 237 ml po daily  . ondansetron (ZOFRAN) 4 MG tablet Take 4 mg by mouth 2 (two) times daily.  . OXYGEN Inhale 2 L into the lungs at bedtime.  . pantoprazole (PROTONIX) 40 MG tablet Take 40 mg by mouth daily.  . polyethylene glycol (MIRALAX / GLYCOLAX) packet Take 17 g by mouth daily.  . predniSONE (DELTASONE) 5 MG tablet TAKE 1 TABLET BY MOUTH DAILY (WITH BREAKFAST) DT:OIZT  . primidone (MYSOLINE) 50 MG tablet Take 150 mg by mouth every morning.   . primidone (MYSOLINE) 50 MG tablet Take 100 mg by mouth at bedtime.   . sennosides-docusate sodium (SENOKOT-S) 8.6-50 MG tablet Take 1 tablet by mouth 2 (two) times daily.   . traZODone (DESYREL) 50 MG tablet Take 1/2 tab =25 mg by mouth at bedtime  . trihexyphenidyl (ARTANE) 2 MG tablet Take 1 tablet (2 mg total) by mouth every morning.  . umeclidinium bromide (INCRUSE ELLIPTA) 62.5 MCG/INH AEPB Inhale 1 puff into the lungs daily. (SHAKE WELL)  . [DISCONTINUED] Alum & Mag Hydroxide-Simeth (MYLANTA MAXIMUM STRENGTH PO) Give 30cc PO q2H PRN x 24H (call MD if indigestion is not relieved w/i 24H   No facility-administered encounter medications on file as of 02/05/2019.    Review of Systems  GENERAL: No change in appetite, no fatigue, no weight changes, no fever, chills or weakness MOUTH and THROAT: Denies oral discomfort, gingival pain or bleeding RESPIRATORY: no cough, SOB, DOE, wheezing, hemoptysis CARDIAC: No chest pain, edema or palpitations GI: No abdominal pain,  diarrhea, constipation, heart burn, nausea or vomiting GU: Denies dysuria, frequency, hematuria, incontinence, or discharge NEUROLOGICAL: Denies dizziness, syncope, numbness, or headache PSYCHIATRIC: Denies feelings of depression or anxiety. No report of hallucinations, insomnia, paranoia, or agitation   Immunization History  Administered Date(s) Administered  . Influenza, High Dose Seasonal PF 10/04/2016  . Influenza,inj,Quad PF,6+ Mos 01/13/2016  . Influenza-Unspecified 11/10/2017, 10/09/2018  . Moderna SARS-COVID-2 Vaccination 01/28/2019  . Pneumococcal-Unspecified 10/11/2011  . Tdap 01/23/2016   Pertinent  Health Maintenance Due  Topic Date Due  . INFLUENZA VACCINE  Completed  . PNA vac Low Risk Adult  Completed  . DEXA SCAN  Discontinued   Fall Risk  10/24/2018 07/19/2018 01/17/2018 11/09/2017 07/14/2017  Falls in the past year? 0 0 0 No No  Number falls in past yr: 0 0 0 - -  Injury with Fall? 0 0 0 - -  Comment - - - - -  Risk Factor Category  - - - - -  Follow up - - Falls evaluation completed - -     Vitals:   02/05/19 1454  BP: 138/78  Pulse: 80  Resp: 18  Temp: (!) 97.5 F (36.4 C)  TempSrc: Oral  SpO2: 98%  Weight: 142 lb (64.4 kg)  Height: 5\' 8"  (1.727 m)   Body mass index is 21.59 kg/m.  Physical Exam  GENERAL APPEARANCE: Well nourished. In no acute distress. Normal body habitus SKIN:  Skin is warm and dry.  MOUTH and THROAT: Lips are without lesions. Oral mucosa is moist and without lesions. Tongue is normal in shape, size, and color and without lesions RESPIRATORY: Breathing is even & unlabored, BS CTAB CARDIAC: RRR, no murmur,no extra heart sounds, no edema GI: Abdomen soft, normal BS, no masses, no tenderness EXTREMITIES:  Able to move X 4 extremities NEUROLOGICAL: There is no tremor. Speech is clear. Alert and oriented X 3. PSYCHIATRIC:  Affect and behavior are appropriate  Labs reviewed: Recent Labs    03/02/18 0000 11/23/18 0000  01/05/19 0000  NA 141 137 135*  K 4.5 4.7 4.6  CL 102 99 96*  CO2 29 28* 23*  BUN 11 14 12   CREATININE 1.0 1.0 0.9  CALCIUM 8.2 8.7 9.2    Recent Labs    08/17/18 0000 11/23/18 0000 01/09/19 0000  WBC 6.0 4.4 5.0  HGB 10.5* 10.3* 9.7*  HCT 31* 30* 29*  PLT 270 225 236   Lab Results  Component Value Date   TSH 4.60 01/09/2019   Lab Results  Component Value Date   HGBA1C 5.2 12/01/2015   Lab Results  Component Value Date   CHOL 107 12/01/2015   HDL 27 (L) 12/01/2015   LDLCALC 59 12/01/2015   TRIG 107 12/01/2015   CHOLHDL 4.0 12/01/2015     Assessment/Plan  1. Tremor - continue Primidone and Trihexyphenidyl, scheduler getting appointment to follow-up with neurology  2. Allergic rhinitis, unspecified seasonality, unspecified trigger -stable, continue fluticasone nasal spray  3. Chronic obstructive pulmonary disease, unspecified COPD type ([place]) - no SOB/wheezing, O2 @ 2L/min via [place], Incruise and PRN Levalbuterol  4. SVT (supraventricular tachycardia) (HCC) - rate-controlled, continue Digoxin and flecainide  5. Chronic diastolic CHF (congestive heart failure) (HCC) - euvolemic, continue Lasix 20 mg daily  6. Chronic pain syndrome - continue PRN Norco, duloxetine and PRN Acetaminophen     Family/ staff Communication: Discussed plan of care with resident.  Labs/tests ordered:  None  Goals of care:   Long-term care   Durenda Age, DNP, FNP-BC [facility] [phone] (Monday-Friday 8:00 a.m. - 5:00 p.m.) [phone] (after hours)

## 2019-02-06 DIAGNOSIS — J9601 Acute respiratory failure with hypoxia: Secondary | ICD-10-CM | POA: Diagnosis not present

## 2019-02-06 DIAGNOSIS — G629 Polyneuropathy, unspecified: Secondary | ICD-10-CM | POA: Diagnosis not present

## 2019-02-06 DIAGNOSIS — M6281 Muscle weakness (generalized): Secondary | ICD-10-CM | POA: Diagnosis not present

## 2019-02-06 DIAGNOSIS — R1312 Dysphagia, oropharyngeal phase: Secondary | ICD-10-CM | POA: Diagnosis not present

## 2019-02-07 DIAGNOSIS — M6281 Muscle weakness (generalized): Secondary | ICD-10-CM | POA: Diagnosis not present

## 2019-02-07 DIAGNOSIS — R1312 Dysphagia, oropharyngeal phase: Secondary | ICD-10-CM | POA: Diagnosis not present

## 2019-02-07 DIAGNOSIS — J9601 Acute respiratory failure with hypoxia: Secondary | ICD-10-CM | POA: Diagnosis not present

## 2019-02-07 DIAGNOSIS — G629 Polyneuropathy, unspecified: Secondary | ICD-10-CM | POA: Diagnosis not present

## 2019-02-08 DIAGNOSIS — M6281 Muscle weakness (generalized): Secondary | ICD-10-CM | POA: Diagnosis not present

## 2019-02-08 DIAGNOSIS — G629 Polyneuropathy, unspecified: Secondary | ICD-10-CM | POA: Diagnosis not present

## 2019-02-08 DIAGNOSIS — J9601 Acute respiratory failure with hypoxia: Secondary | ICD-10-CM | POA: Diagnosis not present

## 2019-02-08 DIAGNOSIS — R1312 Dysphagia, oropharyngeal phase: Secondary | ICD-10-CM | POA: Diagnosis not present

## 2019-02-11 DIAGNOSIS — G629 Polyneuropathy, unspecified: Secondary | ICD-10-CM | POA: Diagnosis not present

## 2019-02-11 DIAGNOSIS — S82842D Displaced bimalleolar fracture of left lower leg, subsequent encounter for closed fracture with routine healing: Secondary | ICD-10-CM | POA: Diagnosis not present

## 2019-02-11 DIAGNOSIS — M6281 Muscle weakness (generalized): Secondary | ICD-10-CM | POA: Diagnosis not present

## 2019-02-12 DIAGNOSIS — S82842D Displaced bimalleolar fracture of left lower leg, subsequent encounter for closed fracture with routine healing: Secondary | ICD-10-CM | POA: Diagnosis not present

## 2019-02-12 DIAGNOSIS — G629 Polyneuropathy, unspecified: Secondary | ICD-10-CM | POA: Diagnosis not present

## 2019-02-12 DIAGNOSIS — M6281 Muscle weakness (generalized): Secondary | ICD-10-CM | POA: Diagnosis not present

## 2019-02-13 DIAGNOSIS — M6281 Muscle weakness (generalized): Secondary | ICD-10-CM | POA: Diagnosis not present

## 2019-02-13 DIAGNOSIS — G629 Polyneuropathy, unspecified: Secondary | ICD-10-CM | POA: Diagnosis not present

## 2019-02-13 DIAGNOSIS — S82842D Displaced bimalleolar fracture of left lower leg, subsequent encounter for closed fracture with routine healing: Secondary | ICD-10-CM | POA: Diagnosis not present

## 2019-02-14 DIAGNOSIS — M6281 Muscle weakness (generalized): Secondary | ICD-10-CM | POA: Diagnosis not present

## 2019-02-14 DIAGNOSIS — S82842D Displaced bimalleolar fracture of left lower leg, subsequent encounter for closed fracture with routine healing: Secondary | ICD-10-CM | POA: Diagnosis not present

## 2019-02-14 DIAGNOSIS — G629 Polyneuropathy, unspecified: Secondary | ICD-10-CM | POA: Diagnosis not present

## 2019-02-15 DIAGNOSIS — G629 Polyneuropathy, unspecified: Secondary | ICD-10-CM | POA: Diagnosis not present

## 2019-02-15 DIAGNOSIS — S82842D Displaced bimalleolar fracture of left lower leg, subsequent encounter for closed fracture with routine healing: Secondary | ICD-10-CM | POA: Diagnosis not present

## 2019-02-15 DIAGNOSIS — M6281 Muscle weakness (generalized): Secondary | ICD-10-CM | POA: Diagnosis not present

## 2019-02-18 DIAGNOSIS — S82842D Displaced bimalleolar fracture of left lower leg, subsequent encounter for closed fracture with routine healing: Secondary | ICD-10-CM | POA: Diagnosis not present

## 2019-02-18 DIAGNOSIS — G629 Polyneuropathy, unspecified: Secondary | ICD-10-CM | POA: Diagnosis not present

## 2019-02-18 DIAGNOSIS — M6281 Muscle weakness (generalized): Secondary | ICD-10-CM | POA: Diagnosis not present

## 2019-02-19 DIAGNOSIS — M6281 Muscle weakness (generalized): Secondary | ICD-10-CM | POA: Diagnosis not present

## 2019-02-19 DIAGNOSIS — G629 Polyneuropathy, unspecified: Secondary | ICD-10-CM | POA: Diagnosis not present

## 2019-02-19 DIAGNOSIS — S82842D Displaced bimalleolar fracture of left lower leg, subsequent encounter for closed fracture with routine healing: Secondary | ICD-10-CM | POA: Diagnosis not present

## 2019-02-20 DIAGNOSIS — S82842D Displaced bimalleolar fracture of left lower leg, subsequent encounter for closed fracture with routine healing: Secondary | ICD-10-CM | POA: Diagnosis not present

## 2019-02-20 DIAGNOSIS — M6281 Muscle weakness (generalized): Secondary | ICD-10-CM | POA: Diagnosis not present

## 2019-02-20 DIAGNOSIS — R110 Nausea: Secondary | ICD-10-CM | POA: Diagnosis not present

## 2019-02-20 DIAGNOSIS — E039 Hypothyroidism, unspecified: Secondary | ICD-10-CM | POA: Diagnosis not present

## 2019-02-20 DIAGNOSIS — G629 Polyneuropathy, unspecified: Secondary | ICD-10-CM | POA: Diagnosis not present

## 2019-02-20 LAB — TSH: TSH: 2.82 (ref 0.41–5.90)

## 2019-02-21 DIAGNOSIS — G629 Polyneuropathy, unspecified: Secondary | ICD-10-CM | POA: Diagnosis not present

## 2019-02-21 DIAGNOSIS — M6281 Muscle weakness (generalized): Secondary | ICD-10-CM | POA: Diagnosis not present

## 2019-02-21 DIAGNOSIS — S82842D Displaced bimalleolar fracture of left lower leg, subsequent encounter for closed fracture with routine healing: Secondary | ICD-10-CM | POA: Diagnosis not present

## 2019-02-22 DIAGNOSIS — S82842D Displaced bimalleolar fracture of left lower leg, subsequent encounter for closed fracture with routine healing: Secondary | ICD-10-CM | POA: Diagnosis not present

## 2019-02-22 DIAGNOSIS — M6281 Muscle weakness (generalized): Secondary | ICD-10-CM | POA: Diagnosis not present

## 2019-02-22 DIAGNOSIS — G629 Polyneuropathy, unspecified: Secondary | ICD-10-CM | POA: Diagnosis not present

## 2019-02-25 ENCOUNTER — Ambulatory Visit (INDEPENDENT_AMBULATORY_CARE_PROVIDER_SITE_OTHER): Payer: Medicare Other | Admitting: Physical Medicine and Rehabilitation

## 2019-02-25 ENCOUNTER — Encounter: Payer: Self-pay | Admitting: Physical Medicine and Rehabilitation

## 2019-02-25 ENCOUNTER — Ambulatory Visit: Payer: Self-pay

## 2019-02-25 ENCOUNTER — Other Ambulatory Visit: Payer: Self-pay

## 2019-02-25 VITALS — BP 125/70 | HR 67

## 2019-02-25 DIAGNOSIS — R296 Repeated falls: Secondary | ICD-10-CM

## 2019-02-25 DIAGNOSIS — G629 Polyneuropathy, unspecified: Secondary | ICD-10-CM | POA: Diagnosis not present

## 2019-02-25 DIAGNOSIS — M48061 Spinal stenosis, lumbar region without neurogenic claudication: Secondary | ICD-10-CM

## 2019-02-25 DIAGNOSIS — M419 Scoliosis, unspecified: Secondary | ICD-10-CM

## 2019-02-25 DIAGNOSIS — M5416 Radiculopathy, lumbar region: Secondary | ICD-10-CM

## 2019-02-25 DIAGNOSIS — S82842D Displaced bimalleolar fracture of left lower leg, subsequent encounter for closed fracture with routine healing: Secondary | ICD-10-CM | POA: Diagnosis not present

## 2019-02-25 DIAGNOSIS — M6281 Muscle weakness (generalized): Secondary | ICD-10-CM | POA: Diagnosis not present

## 2019-02-25 DIAGNOSIS — Z23 Encounter for immunization: Secondary | ICD-10-CM | POA: Diagnosis not present

## 2019-02-25 DIAGNOSIS — G250 Essential tremor: Secondary | ICD-10-CM

## 2019-02-25 MED ORDER — METHYLPREDNISOLONE ACETATE 80 MG/ML IJ SUSP
40.0000 mg | Freq: Once | INTRAMUSCULAR | Status: AC
  Administered 2019-02-25: 14:00:00 40 mg

## 2019-02-25 NOTE — Progress Notes (Signed)
   Numeric Pain Rating Scale and Functional Assessment Average Pain (8)   In the last MONTH (on 0-10 scale) has pain interfered with the following?  1. General activity like being  able to carry out your everyday physical activities such as walking, climbing stairs, carrying groceries, or moving a chair?  Rating(9)   +Driver, -BT, -Dye Allergies.

## 2019-02-26 ENCOUNTER — Encounter: Payer: Self-pay | Admitting: Adult Health

## 2019-02-26 ENCOUNTER — Encounter: Payer: Self-pay | Admitting: Physical Medicine and Rehabilitation

## 2019-02-26 ENCOUNTER — Non-Acute Institutional Stay (SKILLED_NURSING_FACILITY): Payer: Medicare Other | Admitting: Adult Health

## 2019-02-26 DIAGNOSIS — J449 Chronic obstructive pulmonary disease, unspecified: Secondary | ICD-10-CM

## 2019-02-26 DIAGNOSIS — E034 Atrophy of thyroid (acquired): Secondary | ICD-10-CM

## 2019-02-26 DIAGNOSIS — I5032 Chronic diastolic (congestive) heart failure: Secondary | ICD-10-CM

## 2019-02-26 DIAGNOSIS — J302 Other seasonal allergic rhinitis: Secondary | ICD-10-CM | POA: Diagnosis not present

## 2019-02-26 DIAGNOSIS — G629 Polyneuropathy, unspecified: Secondary | ICD-10-CM | POA: Diagnosis not present

## 2019-02-26 DIAGNOSIS — M6281 Muscle weakness (generalized): Secondary | ICD-10-CM | POA: Diagnosis not present

## 2019-02-26 DIAGNOSIS — S82842D Displaced bimalleolar fracture of left lower leg, subsequent encounter for closed fracture with routine healing: Secondary | ICD-10-CM | POA: Diagnosis not present

## 2019-02-26 DIAGNOSIS — M5441 Lumbago with sciatica, right side: Secondary | ICD-10-CM

## 2019-02-26 DIAGNOSIS — G8929 Other chronic pain: Secondary | ICD-10-CM | POA: Diagnosis not present

## 2019-02-26 DIAGNOSIS — R251 Tremor, unspecified: Secondary | ICD-10-CM | POA: Diagnosis not present

## 2019-02-26 MED ORDER — HYDROCODONE-ACETAMINOPHEN 5-325 MG PO TABS: ORAL_TABLET | ORAL | 0 refills | Status: DC

## 2019-02-26 NOTE — Progress Notes (Signed)
Location:  [place] Room Number: 202/A Place of Service:  SNF (31) Provider:  Durenda Age, DNP, FNP-BC  Patient Care Team: [name], MD as PCP - General (Internal Medicine) Medina-Vargas, [name], NP as Nurse Practitioner (Internal Medicine)  Extended Emergency Contact Information Primary Emergency Contact: Hatfield,Lisa Address: [address]          [place], [place] [address] [place] Work Phone: [phone] Mobile Phone: [phone] Relation: Daughter  Code Status:  Full Code  Goals of care: Advanced Directive information Advanced Directives 02/26/2019  Does Patient Have a Medical Advance Directive? Yes  Type of Advance Directive (No Data)  Does patient want to make changes to medical advance directive? No - Patient declined  Would patient like information on creating a medical advance directive? -     Chief Complaint  Patient presents with  . Medical Management of Chronic Issues    Routine visit of medical management    HPI:  Pt is a 79 y.o. female seen today for medical management of chronic diseases. She is a long-term care resident of [facility]. She has a PMH of COPD, spinal stenosis, depression, and anxiety. Today is her S/P day 2 of Moderna COVID-19 vaccine. No reported side effects has been noted. Yesterday, 02/25/19, she had a follow up consultation with orthopedics, Dr. Magnus Sinning lower back and right hip pain. She has some foraminal and lateral recess narrowing on the right at L4-5. Symptoms thought to be related to stenosis. She had L5 transforaminal epidural injection with methyprednisolone acetate 40 mg.   Past Medical History:  Diagnosis Date  . COPD (chronic obstructive pulmonary disease) ([place])   . History of anxiety   . History of depression   . Spinal stenosis   . UTI (urinary tract infection)    Past Surgical History:  Procedure Laterality Date  . ANKLE FRACTURE  SURGERY Left   . CATARACT EXTRACTION, BILATERAL    . FLEXIBLE SIGMOIDOSCOPY Left 11/18/2015   Procedure: FLEXIBLE SIGMOIDOSCOPY;  Surgeon: [name], MD;  Location: [place];  Service: Endoscopy;  Laterality: Left;  . FLEXIBLE SIGMOIDOSCOPY N/A 11/20/2015   Procedure: FLEXIBLE SIGMOIDOSCOPY;  Surgeon: [name], MD;  Location: [facility] ENDOSCOPY;  Service: Endoscopy;  Laterality: N/A;  . HIP ARTHROPLASTY Right 01/12/2016   Procedure: ARTHROPLASTY BIPOLAR HIP (HEMIARTHROPLASTY);  Surgeon: [name], MD;  Location: [facility];  Service: Orthopedics;  Laterality: Right;    Allergies  Allergen Reactions  . Biofreeze [Menthol (Topical Analgesic)] Rash    By history Aspercreme does not cause a rash    Outpatient Encounter Medications as of 02/26/2019  Medication Sig  . acetaminophen (TYLENOL) 325 MG tablet Take 650 mg by mouth every 6 (six) hours as needed.  Marland Kitchen alum & mag hydroxide-simeth (MAALOX PLUS) 400-400-40 MG/5ML suspension Give 30cc PO q2H PRN x 24H (call MD if indigestion is not relieved w/i 24H)  . aspirin 81 MG chewable tablet Chew 81 mg by mouth daily.   . digoxin (LANOXIN) 0.125 MG tablet Take 0.125 mg by mouth daily. Hold if HR <60  . DULoxetine (CYMBALTA) 30 MG capsule TAKE 1 CAPSULE BY MOUTH ONCE DAILY FOR DEPRESSION/PAIN (TAKE WITH 60MG  TO=90MG ) (DO NOT CRUSH)  . DULoxetine (CYMBALTA) 60 MG capsule TAKE 1 CAPSULE BY MOUTH ONCE DAILY FOR DEPRESSION/PAIN (TAKE WITH 30MG  TO=90MG ) (DO NOT CRUSH)  . FLECAINIDE ACETATE PO Take 50 mg by mouth every 12 (twelve) hours.  . fluticasone (FLONASE ALLERGY RELIEF) 50 MCG/ACT  nasal spray Place 1 spray into both nostrils daily.  . furosemide (LASIX) 20 MG tablet Take 20 mg by mouth daily. For CHF  . gabapentin (NEURONTIN) 100 MG capsule Take 2 capsules = 200 mg by mouth twice a day  . HYDROcodone-acetaminophen (NORCO/VICODIN) 5-325 MG tablet Give **1/2 tablet by mouth q 6 hrs PRN  . levalbuterol (XOPENEX) 1.25 MG/3ML nebulizer solution Take 1.25 mg  by nebulization every 6 (six) hours as needed for wheezing.  Marland Kitchen levothyroxine (SYNTHROID, LEVOTHROID) 25 MCG tablet Take 50 mcg by mouth daily before breakfast. FOR HYPOTHYROIDISM  . Lidocaine (ASPERCREME LIDOCAINE) 4 % PTCH APPLY 1 PATCH TRANSDERMALLY TO LOWER BACK AT BEDTIME FOR OSTEOARTHRITIS  . loratadine (CLARITIN) 10 MG tablet Take 10 mg by mouth at bedtime.  . Multiple Vitamins-Minerals (MULTIVITAMIN WITH MINERALS) tablet Take 1 tablet by mouth daily.  . naphazoline-glycerin (CLEAR EYES REDNESS RELIEF) 0.012-0.2 % SOLN Place 1 drop into both eyes 2 (two) times daily.  . Nutritional Supplements (ENSURE PO) Take by mouth. ENSURE LIQUID Give 237 ml po daily  . ondansetron (ZOFRAN) 4 MG tablet Take 4 mg by mouth 2 (two) times daily.  . OXYGEN Inhale 2 L into the lungs at bedtime.  . pantoprazole (PROTONIX) 40 MG tablet Take 40 mg by mouth daily.  . polyethylene glycol (MIRALAX / GLYCOLAX) packet Take 17 g by mouth daily.  . predniSONE (DELTASONE) 5 MG tablet TAKE 1 TABLET BY MOUTH DAILY (WITH BREAKFAST) UE:AVWU  . primidone (MYSOLINE) 50 MG tablet Take 150 mg by mouth every morning.   . primidone (MYSOLINE) 50 MG tablet Take 100 mg by mouth at bedtime.   . sennosides-docusate sodium (SENOKOT-S) 8.6-50 MG tablet Take 1 tablet by mouth 2 (two) times daily.   . traZODone (DESYREL) 50 MG tablet Take 1/2 tab =25 mg by mouth at bedtime  . trihexyphenidyl (ARTANE) 2 MG tablet Take 1 tablet (2 mg total) by mouth every morning.  . umeclidinium bromide (INCRUSE ELLIPTA) 62.5 MCG/INH AEPB Inhale 1 puff into the lungs daily. (SHAKE WELL)  . [DISCONTINUED] HYDROcodone-acetaminophen (NORCO/VICODIN) 5-325 MG tablet Give **1/2 tablet by mouth q 6 hrs PRN   No facility-administered encounter medications on file as of 02/26/2019.    Review of Systems  GENERAL: No change in appetite, no fatigue, no weight changes, no fever, chills or weakness MOUTH and THROAT: Denies oral discomfort, gingival pain or  bleeding RESPIRATORY: no cough, SOB, DOE, wheezing, hemoptysis CARDIAC: No chest pain, edema or palpitations GI: No abdominal pain, diarrhea, constipation, heart burn, nausea or vomiting GU: Denies dysuria, frequency, hematuria, incontinence, or discharge NEUROLOGICAL: Denies dizziness, syncope, numbness, or headache PSYCHIATRIC: Denies feelings of depression or anxiety. No report of hallucinations, insomnia, paranoia, or agitation   Immunization History  Administered Date(s) Administered  . Influenza, High Dose Seasonal PF 10/04/2016  . Influenza,inj,Quad PF,6+ Mos 01/13/2016  . Influenza-Unspecified 11/10/2017, 10/09/2018  . Moderna SARS-COVID-2 Vaccination 01/28/2019, 02/25/2019  . Pneumococcal-Unspecified 10/11/2011  . Tdap 01/23/2016   Pertinent  Health Maintenance Due  Topic Date Due  . INFLUENZA VACCINE  Completed  . PNA vac Low Risk Adult  Completed  . DEXA SCAN  Discontinued   Fall Risk  10/24/2018 07/19/2018 01/17/2018 11/09/2017 07/14/2017  Falls in the past year? 0 0 0 No No  Number falls in past yr: 0 0 0 - -  Injury with Fall? 0 0 0 - -  Comment - - - - -  Risk Factor Category  - - - - -  Follow  up - - Falls evaluation completed - -     Vitals:   02/26/19 0833  BP: 106/60  Pulse: 68  Resp: 20  Temp: (!) 96.8 F (36 C)  TempSrc: Oral  SpO2: 95%  Weight: 140 lb (63.5 kg)  Height: 5\' 8"  (1.727 m)   Body mass index is 21.29 kg/m.  Physical Exam  GENERAL APPEARANCE: Well nourished. In no acute distress. Normal body habitus SKIN:  Skin is warm and dry.  MOUTH and THROAT: Lips are without lesions. Oral mucosa is moist and without lesions. Tongue is normal in shape, size, and color and without lesions RESPIRATORY: Breathing is even & unlabored, BS CTAB CARDIAC: RRR, no murmur,no extra heart sounds, no edema GI: Abdomen soft, normal BS, no masses, no tenderness NEUROLOGICAL: + tremor. Speech is clear.  Alert and oriented X 3. PSYCHIATRIC: Affect and behavior  are appropriate  Labs reviewed: Recent Labs    03/02/18 0000 11/23/18 0000 01/05/19 0000  NA 141 137 135*  K 4.5 4.7 4.6  CL 102 99 96*  CO2 29 28* 23*  BUN 11 14 12   CREATININE 1.0 1.0 0.9  CALCIUM 8.2 8.7 9.2    Recent Labs    08/17/18 0000 11/23/18 0000 01/09/19 0000  WBC 6.0 4.4 5.0  HGB 10.5* 10.3* 9.7*  HCT 31* 30* 29*  PLT 270 225 236   Lab Results  Component Value Date   TSH 4.60 01/09/2019   Lab Results  Component Value Date   HGBA1C 5.2 12/01/2015   Lab Results  Component Value Date   CHOL 107 12/01/2015   HDL 27 (L) 12/01/2015   LDLCALC 59 12/01/2015   TRIG 107 12/01/2015   CHOLHDL 4.0 12/01/2015    Significant Diagnostic Results in last 30 days:  XR C-ARM NO REPORT  Result Date: 02/25/2019 Please see Notes tab for imaging impression.   Assessment/Plan  1. Hypothyroidism due to acquired atrophy of thyroid Lab Results  Component Value Date   TSH 2.82 02/20/2019   - continue Levothyroxine  2. Tremor - continues to have tremors, continue Trihexiphenydil and Primidone, will schedule with another neurologist  3. Chronic right-sided low back pain with right-sided sciatica - S/P L5 transforaminal epidural injection with methyprednisolone acetate 40 mg.on 02/25/19 -Continue PRN acetaminophen, gabapentin teen, duloxetine, Aspercreme Lidocaine 4% patch and PRN Norco  4. Chronic diastolic CHF (congestive heart failure) (HCC) -Stable, continue Lasix and digoxin  5. Seasonal allergies -Continue fluticasone and loratadine  6.  COPD -No wheezing, continue O2 2 L/minute via [place] continuously -Incruse Ellipta, prednisone and PRN levalbuterol    Family/ staff Communication: Discussed plan of care with resident and charge nurse.  Labs/tests ordered:   None  Goals of care:  Long-term care    Durenda Age, DNP, FNP-BC [facility] [phone] (Monday-Friday 8:00 a.m. - 5:00 p.m.) [phone] (after  hours)

## 2019-02-26 NOTE — Progress Notes (Signed)
Tranesha Lessner Curlin - 79 y.o. female MRN [id number]  Date of birth: 13-Aug-1940  Office Visit Note: Visit Date: 02/25/2019 PCP: [name], MD Referred by: [name], MD  Subjective: Chief Complaint  Patient presents with   Lower Back - Pain   Right Hip - Pain   Fall   Weakness   Tremors   HPI: LATRISH MOGEL is a 79 y.o. female who comes in today At the request of Dr. Eduard Roux for right L5 transforaminal epidural steroid injection.  She also has 2 other complaints today.  She does reside in the [facility] home.  She reports another recent fall.  She also reports hand tremor.  In terms of her back and hip pain she has had interlaminar epidural steroid injection without much relief followed many months later with facet joint blocks without any relief.  She does have some foraminal and lateral recess narrowing on the right at L4-5.  Symptoms could be related to that stenosis and so today was planned to do an L5 transforaminal injection.  Likely if that injection is not successful there is probably not much else to do at that point from an interventional spine standpoint.  Nonetheless she reports to me today that she had a recent fall.  This is common place evidently for her.  She reports that she was evaluated there with x-ray and there was nothing broken.  She does not endorse any increased back pain from the fall.  She does not endorse any other issues from the fall.  She reports that she is very weak in general.  She also reports significant tremor particular in the right hand.  She is asking what can be done about that.  I did review several pages of notes.  She has seen Dr. Wells Guiles Tat who is a movement disorder specialist at [facility] neurolog[facility].  She is probably the best in town for looking at this.  She was diagnosed with essential tremor and placed on some medication.  It appears there were 2 or 3 missed telemedicine appointments and no-shows and so that office  dismissed the patient.  It appears from reading the messages that it was probably the nursing home not being able to connect without office.  This is the same because at [facility] she needs to see.  If it is essential tremor and the patient actually does report that she was told it was not Parkinson's and the only options there are medication or something like a deep brain stimulator but she needs follow-up with a neurologist.  In terms of her back pain it is worse with standing and movement.  There is no paresthesias.  There is no focal weakness although she is weak in general from deconditioning.  She suffers multiple medical issues including congestive heart failure.  Review of Systems  Musculoskeletal: Positive for back pain, falls and joint pain.  Neurological: Positive for tremors and weakness.  All other systems reviewed and are negative.  Otherwise per HPI.  Assessment & Plan: Visit Diagnoses:  1. Lumbar radiculopathy   2. Scoliosis of thoracolumbar spine, unspecified scoliosis type   3. Foraminal stenosis of lumbar region   4. Falls frequently   5. Benign essential tremor     Plan: Findings:  Chronic worsening severe at times low back and right hip and thigh pain with lumbar scoliosis and some foraminal lateral recess narrowing at L4-5 on the right.  Epidural injection and facet joint block were not very  successful.  We are going to complete diagnostic and hopefully therapeutic L5 transforaminal epidural injection or selective nerve root block.  If she does not get much relief with that especially if is not diagnostic to have some relief at least initially then I am not sure what else could be done there except conservative measures with potential bracing TENS unit and physical therapy and medication.  From a standpoint of her tremors that she is very concerned about she has had evaluation by Dr. Wells Guiles Tat.  She has been dismissed by that office Sharol Given missed appointments even though she is in a  nursing facility and you would hope they can get those appointments scheduled.  I have told her and I think we wrote on her notes going back to the nursing home they can find another neurologist maybe someone at [facility] neurology to manage that.  If it is just essential tremor based on Dr. Doristine Devoid evaluation then the geriatrician or medical doctor at that nursing home can manage the medication for essential tremor.  Lastly in terms of fall she does need physical therapy and I think she should be getting that there at the nursing home but I think she says that that is not open at this point which I am not sure of.  She really needs to follow-up with Dr. Monna Fam depending on her orthopedic complaints and falls.    Meds & Orders:  Meds ordered this encounter  Medications   methylPREDNISolone acetate (DEPO-MEDROL) injection 40 mg    Orders Placed This Encounter  Procedures   XR C-ARM NO REPORT   Epidural Steroid injection    Follow-up: Follow-up is indicated in the plan above and also written notes back to the nursing facility to follow-up with a different neurologist if needed for the tremor.  Follow-up with Dr. Eduard Roux as needed from an orthopedic standpoint.   Procedures: No procedures performed  No notes on file   Clinical History: MRI LUMBAR SPINE WITHOUT CONTRAST  TECHNIQUE: Multiplanar, multisequence MR imaging of the lumbar spine was performed. No intravenous contrast was administered.  COMPARISON:  Lumbar radiographs 07/18/2017  FINDINGS: Segmentation:  Normal  Alignment: Moderate levoscoliosis. Mild retrolisthesis L2-3 and L3-4. 4 mm anterolisthesis L5-S1  Vertebrae:  Negative for fracture or mass.  Conus medullaris and cauda equina: Conus extends to the L2 level. Conus and cauda equina appear normal.  Paraspinal and other soft tissues: 6 cm left renal cyst. No paraspinous mass.  Disc levels:  T12-L1: Moderate disc degeneration and spurring with  right foraminal stenosis  L1-2: Disc degeneration and spurring right greater than left with moderate right foraminal encroachment.  L2-3: Disc degeneration and diffuse bulging of the disc. Mild spinal stenosis. Moderate subarticular stenosis bilaterally right greater than left due to spurring.  L3-4: Disc degeneration and spondylosis with mild facet degeneration. Mild subarticular stenosis bilaterally  L4-5: Disc degeneration and spurring. Marked facet hypertrophy on the left. Severe subarticular foraminal stenosis on the left due to spurring.  L5-S1: Mild anterolisthesis. Bilateral facet degeneration with mild subarticular stenosis bilaterally.  IMPRESSION: Lumbar scoliosis with multilevel degenerative change causing stenosis as described above. The most severe stenosis is on the right at L4-5. There is right foraminal stenosis at T12-L1 L1-2 and L2-3.   Electronically Signed   By: Franchot Gallo M.D.   On: 11/22/2017 16:16   She reports that she quit smoking about 3 years ago. She has never used smokeless tobacco. No results for input(s): HGBA1C, LABURIC in the  last 8760 hours.  Objective:  VS:  HT:     WT:    BMI:      BP:125/70   HR:67bpm   TEMP: ( )   RESP:  Physical Exam Vitals and nursing note reviewed.  Constitutional:      General: She is not in acute distress.    Appearance: Normal appearance. She is well-developed. She is not ill-appearing.  HENT:     Head: Normocephalic and atraumatic.  Eyes:     Conjunctiva/sclera: Conjunctivae normal.     Pupils: Pupils are equal, round, and reactive to light.  Cardiovascular:     Rate and Rhythm: Normal rate.     Pulses: Normal pulses.  Pulmonary:     Effort: Pulmonary effort is normal.  Musculoskeletal:     Right lower leg: No edema.     Left lower leg: No edema.     Comments: Patient arrives sitting in a wheelchair.  She can stand with some assist.  She appears frail.  She has good strength measurement in  the legs without foot drop.  She has no pain with hip rotation.  She is tender on both greater trochanters.  Skin:    General: Skin is warm and dry.     Findings: No erythema or rash.  Neurological:     General: No focal deficit present.     Mental Status: She is alert and oriented to person, place, and time.     Sensory: No sensory deficit.     Motor: No abnormal muscle tone.     Coordination: Coordination normal.     Gait: Gait normal.  Psychiatric:        Mood and Affect: Mood normal.        Behavior: Behavior normal.     Ortho Exam Imaging: XR C-ARM NO REPORT  Result Date: 02/25/2019 Please see Notes tab for imaging impression.   Past Medical/Family/Surgical/Social History: Medications & Allergies reviewed per EMR, new medications updated. Patient Active Problem List   Diagnosis Date Noted   Osteoarthritis 11/01/2018   Somnolence, daytime 11/01/2018   Constipation 10/05/2018   Poor appetite 10/05/2018   Palliative care patient 08/09/2018   Hypothyroidism 07/24/2018   Seasonal allergies 06/30/2018   Chronic radicular cervical pain 04/26/2018   Chronic right-sided low back pain with right-sided sciatica 03/21/2018   Spinal stenosis 11/23/2017   Headache 08/31/2017   Elevated TSH 08/31/2017   Pain in right hip 07/18/2017   Right-sided low back pain with right-sided sciatica 07/18/2017   Abnormal chest x-ray 04/04/2017   Tremor 04/04/2017   Bimalleolar ankle fracture, left, sequela 10/11/2016   Altered mental status 10/03/2016   Neuropathy 10/03/2016   Chronic diastolic CHF (congestive heart failure) ([place]) 10/03/2016   Acute lower UTI    Recurrent falls 02/02/2016   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   Upper GI bleed 01/23/2016   Blood loss anemia 33/29/5188   Acute diastolic CHF (congestive heart failure) ([place]) 01/23/2016   Heme + stool    Abnormal CT scan, colon    Paroxysmal junctional tachycardia (HCC)    Hypoxia    At  risk for adverse drug reaction 01/18/2016   Subcapital fracture of right hip, closed 01/12/2016   Alteration consciousness 11/30/2015   SVT (supraventricular tachycardia) ([place]) 11/27/2015   Diarrhea 11/27/2015   Depression, recurrent ([place]) 11/27/2015   Polyneuropathy 11/27/2015   Renal insufficiency    Protein-calorie malnutrition, severe 11/18/2015   Intra-abdominal fluid collection    Supraventricular premature beats  Functional diarrhea    Sepsis ([place])    Hyperthyroidism    Hypotension    Septic shock ([place])    Acute encephalopathy    Hypokalemia    Acute delirium 11/10/2015   AKI (acute kidney injury) ([place]) 11/10/2015   Anemia, iron deficiency 11/10/2015   Anxiety 11/10/2015   Chronic pain syndrome 11/10/2015   Recurrent UTI 11/10/2015   Acute respiratory failure with hypoxia ([place]) 11/10/2015   COPD (chronic obstructive pulmonary disease) ([place]) 11/10/2015   Acute hyperglycemia 11/10/2015   CKD (chronic kidney disease) stage 3, GFR 30-59 ml/min 11/10/2015   Narrow complex tachycardia ([place]) 11/10/2015   HTN (hypertension) 11/10/2015   Fatigue 11/10/2015   Anemia    Delirium    Hypoxemia    Tachycardia    Past Medical History:  Diagnosis Date   COPD (chronic obstructive pulmonary disease) (HCC)    History of anxiety    History of depression    Spinal stenosis    UTI (urinary tract infection)    Family History  Problem Relation Age of Onset   Heart attack Mother    Diabetes Father    Breast cancer Sister    Thyroid disease Neg Hx    Past Surgical History:  Procedure Laterality Date   ANKLE FRACTURE SURGERY Left    CATARACT EXTRACTION, BILATERAL     FLEXIBLE SIGMOIDOSCOPY Left 11/18/2015   Procedure: FLEXIBLE SIGMOIDOSCOPY;  Surgeon: [name], MD;  Location: [place];  Service: Endoscopy;  Laterality: Left;   FLEXIBLE SIGMOIDOSCOPY N/A 11/20/2015   Procedure: FLEXIBLE SIGMOIDOSCOPY;  Surgeon: [name], MD;   Location: [facility]COPY;  Service: Endoscopy;  Laterality: N/A;   HIP ARTHROPLASTY Right 01/12/2016   Procedure: ARTHROPLASTY BIPOLAR HIP (HEMIARTHROPLASTY);  Surgeon: [name], MD;  Location: [facility];  Service: Orthopedics;  Laterality: Right;   Social History   Occupational History   Occupation: retired    Comment: 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

## 2019-02-27 DIAGNOSIS — S82842D Displaced bimalleolar fracture of left lower leg, subsequent encounter for closed fracture with routine healing: Secondary | ICD-10-CM | POA: Diagnosis not present

## 2019-02-27 DIAGNOSIS — M6281 Muscle weakness (generalized): Secondary | ICD-10-CM | POA: Diagnosis not present

## 2019-02-27 DIAGNOSIS — G629 Polyneuropathy, unspecified: Secondary | ICD-10-CM | POA: Diagnosis not present

## 2019-02-28 DIAGNOSIS — M6281 Muscle weakness (generalized): Secondary | ICD-10-CM | POA: Diagnosis not present

## 2019-02-28 DIAGNOSIS — G629 Polyneuropathy, unspecified: Secondary | ICD-10-CM | POA: Diagnosis not present

## 2019-02-28 DIAGNOSIS — S82842D Displaced bimalleolar fracture of left lower leg, subsequent encounter for closed fracture with routine healing: Secondary | ICD-10-CM | POA: Diagnosis not present

## 2019-03-13 ENCOUNTER — Other Ambulatory Visit: Payer: Self-pay | Admitting: Adult Health

## 2019-03-13 DIAGNOSIS — F419 Anxiety disorder, unspecified: Secondary | ICD-10-CM | POA: Diagnosis not present

## 2019-03-13 DIAGNOSIS — G4700 Insomnia, unspecified: Secondary | ICD-10-CM | POA: Diagnosis not present

## 2019-03-13 DIAGNOSIS — F329 Major depressive disorder, single episode, unspecified: Secondary | ICD-10-CM | POA: Diagnosis not present

## 2019-03-13 MED ORDER — HYDROCODONE-ACETAMINOPHEN 5-325 MG PO TABS: ORAL_TABLET | ORAL | 0 refills | Status: DC

## 2019-03-14 ENCOUNTER — Encounter: Payer: Self-pay | Admitting: Neurology

## 2019-03-14 ENCOUNTER — Ambulatory Visit (INDEPENDENT_AMBULATORY_CARE_PROVIDER_SITE_OTHER): Payer: Medicare Other | Admitting: Neurology

## 2019-03-14 ENCOUNTER — Telehealth: Payer: Self-pay | Admitting: Neurology

## 2019-03-14 ENCOUNTER — Other Ambulatory Visit: Payer: Self-pay

## 2019-03-14 VITALS — BP 121/59 | HR 66 | Temp 97.4°F

## 2019-03-14 DIAGNOSIS — R519 Headache, unspecified: Secondary | ICD-10-CM | POA: Diagnosis not present

## 2019-03-14 DIAGNOSIS — G250 Essential tremor: Secondary | ICD-10-CM

## 2019-03-14 DIAGNOSIS — G8929 Other chronic pain: Secondary | ICD-10-CM | POA: Diagnosis not present

## 2019-03-14 MED ORDER — PROPRANOLOL HCL ER 60 MG PO CP24: 60.0000 mg | ORAL_CAPSULE | Freq: Every day | ORAL | 11 refills | Status: DC

## 2019-03-14 NOTE — Progress Notes (Signed)
PATIENT: Dawn [name] DOB: 1940-12-27  Chief Complaint  Patient presents with  . Tremors    Reports issues with tremors in her bilateral hands. She has difficulty using her phone, eating and writing.  Marland Kitchen Headache    Says she wakes up 3-4 times per week. At times her pain is severe. She uses Tylenol which does not really help. She has to lay down and sleep it off.   Marland Kitchen PCP    [name], MD     HISTORICAL  Dawn [name] is a 79 year old female, seen in request by her primary care physician Dr. Unice Cobble for evaluation of tremor, headaches, initial evaluation was on March 15, 2019.  She is currently a resident of [facility], was brought in by transportation, is alone at today's clinical visit.  I have reviewed and summarized the referring note from the referring physician.  She has past medical history of hypothyroidism, on supplement, depression, anxiety, history of right hip fracture, status post arthroplasty bipolar hip in January 2018, severe right hip pain, wheelchair-bound, SVT, on digoxin, rate controlled with flecainide  She was a patient of [facility] neurologist Dr. Carles Collet since Jan 2020, for evaluation of tremor, patient reported bilateral upper extremity tremor since 2018, also had occasionally head titubation, she has significant gait difficulty, which she contributed to her right hip pain, and surgery, there was no family history of tremor, she was diagnosed with essential tremor, was started on primidone, currently she is only taking 50 mg 2 tablets at nighttime, reviewing Dr. Doristine Devoid record, there was attempt to increase the dosage to higher, but she was not sure why she is on current dose, in addition, she is also on artane 2 mg every night, despite the treatment, she continues to have bilateral hands tremor, difficulty holding utensils, often shake liquid out of the cup.  She denies a previous history of headache, complains frequent headaches since 2020, she  complains of 8 out of 10 holoacranial headache, has been ongoing for few days, failed Tylenol treatment, she also complains of bilateral neck pain, neck muscle stiffness, shoulder muscle pain,  I personally reviewed MRI of lumbar spine in November 2019: Lumbar scoliosis with multilevel degenerative changes, with variable degree of foraminal stenosis, most severe at L4-5, severe left subarticular foraminal stenosis, moderate subarticular stenosis bilaterally right greater than left, no evidence of spinal cord compression.   REVIEW OF SYSTEMS: Full 14 system review of systems performed and notable only for as above All other review of systems were negative.  ALLERGIES: Allergies  Allergen Reactions  . Biofreeze [Menthol (Topical Analgesic)] Rash    By history Aspercreme does not cause a rash    HOME MEDICATIONS: Current Outpatient Medications  Medication Sig Dispense Refill  . acetaminophen (TYLENOL) 325 MG tablet Take 650 mg by mouth every 6 (six) hours as needed.    Marland Kitchen alum & mag hydroxide-simeth (MAALOX PLUS) 400-400-40 MG/5ML suspension Give 30cc PO q2H PRN x 24H (call MD if indigestion is not relieved w/i 24H)    . aspirin 81 MG chewable tablet Chew 81 mg by mouth daily.     . digoxin (LANOXIN) 0.125 MG tablet Take 0.125 mg by mouth daily. Hold if HR <60    . DULoxetine (CYMBALTA) 30 MG capsule TAKE 1 CAPSULE BY MOUTH ONCE DAILY FOR DEPRESSION/PAIN (TAKE WITH '60MG'$  TO='90MG'$ ) (DO NOT CRUSH)    . DULoxetine (CYMBALTA) 60 MG capsule TAKE 1 CAPSULE BY MOUTH ONCE DAILY FOR DEPRESSION/PAIN (TAKE WITH '30MG'$   TO=90MG) (DO NOT CRUSH)    . FLECAINIDE ACETATE PO Take 50 mg by mouth every 12 (twelve) hours.    . fluticasone (FLONASE ALLERGY RELIEF) 50 MCG/ACT nasal spray Place 1 spray into both nostrils daily.    . furosemide (LASIX) 20 MG tablet Take 20 mg by mouth daily. For CHF    . gabapentin (NEURONTIN) 100 MG capsule Take 2 capsules = 200 mg by mouth twice a day 120 capsule 3  . guaiFENesin  (MUCINEX) 600 MG 12 hr tablet Take 600 mg by mouth 2 (two) times daily. COPD cough    . HYDROcodone-acetaminophen (NORCO/VICODIN) 5-325 MG tablet Give **1/2 tablet by mouth q 6 hrs PRN 30 tablet 0  . levalbuterol (XOPENEX) 1.25 MG/3ML nebulizer solution Take 1.25 mg by nebulization every 6 (six) hours as needed for wheezing.    Marland Kitchen levothyroxine (SYNTHROID, LEVOTHROID) 25 MCG tablet Take 50 mcg by mouth daily before breakfast. FOR HYPOTHYROIDISM    . Lidocaine (ASPERCREME LIDOCAINE) 4 % PTCH APPLY 1 PATCH TRANSDERMALLY TO LOWER BACK AT BEDTIME FOR OSTEOARTHRITIS    . loratadine (CLARITIN) 10 MG tablet Take 10 mg by mouth at bedtime.    . Multiple Vitamins-Minerals (MULTIVITAMIN WITH MINERALS) tablet Take 1 tablet by mouth daily.    . naphazoline-glycerin (CLEAR EYES REDNESS RELIEF) 0.012-0.2 % SOLN Place 1 drop into both eyes 2 (two) times daily.    . Nutritional Supplements (ENSURE PO) Take by mouth. ENSURE LIQUID Give 237 ml po daily    . ondansetron (ZOFRAN) 4 MG tablet Take 4 mg by mouth 2 (two) times daily.    . OXYGEN Inhale 2 L into the lungs at bedtime.    . pantoprazole (PROTONIX) 40 MG tablet Take 40 mg by mouth daily.    . polyethylene glycol (MIRALAX / GLYCOLAX) packet Take 17 g by mouth daily.    . predniSONE (DELTASONE) 5 MG tablet TAKE 1 TABLET BY MOUTH DAILY (WITH BREAKFAST) [id number]    . primidone (MYSOLINE) 50 MG tablet Take 150 mg by mouth every morning.     . primidone (MYSOLINE) 50 MG tablet Take 100 mg by mouth at bedtime.     . sennosides-docusate sodium (SENOKOT-S) 8.6-50 MG tablet Take 1 tablet by mouth 2 (two) times daily.     . traZODone (DESYREL) 50 MG tablet Take 1/2 tab =25 mg by mouth at bedtime 30 tablet 3  . trihexyphenidyl (ARTANE) 2 MG tablet Take 1 tablet (2 mg total) by mouth every morning. 90 tablet 0  . umeclidinium bromide (INCRUSE ELLIPTA) 62.5 MCG/INH AEPB Inhale 1 puff into the lungs daily. (SHAKE WELL)     No current facility-administered medications for  this visit.    PAST MEDICAL HISTORY: Past Medical History:  Diagnosis Date  . Anemia   . Chronic diastolic heart failure ([place])   . Chronic kidney disease, stage 3   . Chronic pain syndrome   . Constipation   . COPD (chronic obstructive pulmonary disease) ([place])   . Dysphagia   . Frequent falls   . Generalized muscle weakness   . GERD (gastroesophageal reflux disease)   . Headache   . Hip pain, right   . History of anxiety   . History of depression   . Hyperglycemia   . Hypothyroid   . Insomnia   . Metabolic encephalopathy   . Palpitation   . Polyneuropathy   . Spinal stenosis   . Spinal stenosis, cervical region   . Supraventricular tachycardia ([place])   .  Thyrotoxicosis, unspecified with thyrotoxic crisis or storm   . Tremor   . Unsteadiness on feet   . UTI (urinary tract infection)     PAST SURGICAL HISTORY: Past Surgical History:  Procedure Laterality Date  . ANKLE FRACTURE SURGERY Left   . CATARACT EXTRACTION, BILATERAL    . FLEXIBLE SIGMOIDOSCOPY Left 11/18/2015   Procedure: FLEXIBLE SIGMOIDOSCOPY;  Surgeon: [name], MD;  Location: [facility];  Service: Endoscopy;  Laterality: Left;  . FLEXIBLE SIGMOIDOSCOPY N/A 11/20/2015   Procedure: FLEXIBLE SIGMOIDOSCOPY;  Surgeon: [name], MD;  Location[facility];  Service: Endoscopy;  Laterality: N/A;  . HIP ARTHROPLASTY Right 01/12/2016   Procedure: ARTHROPLASTY BIPOLAR HIP (HEMIARTHROPLASTY);  Surgeon: [name], MD;  Location: [facility];  Service: Orthopedics;  Laterality: Right;    FAMILY HISTORY: Family History  Problem Relation Age of Onset  . Heart attack Mother   . Diabetes Father   . Breast cancer Sister   . Thyroid disease Neg Hx     SOCIAL HISTORY: Social History   Socioeconomic History  . Marital status: Divorced    Spouse name: Not on file  . Number of children: 1  . Years of education: 11th grade  . Highest education level: Not on file  Occupational History  . 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 Smoking status: Former Smoker    Quit date: 12/11/2015    Years since quitting: 3.2  . Smokeless tobacco: Never Used  . Tobacco comment: Smoked age [age] up to < 1 ppd, usually half a pack per day, mainly one half pack per day  Substance and Sexual Activity  . Alcohol use: No  . Drug use: No  . Sexual activity: Not Currently  Other Topics Concern  . Not on file  Social History Narrative   She is a long-term care resident of [facility].   Right handed.   Occasional caffeine.         Social Determinants of Health   Financial Resource Strain:   . Difficulty of Paying Living Expenses: Not on file  Food Insecurity:   . Worried About Charity fundraiser in the Last Year: Not on file  . Ran Out of Food in the Last Year: Not on file  Transportation Needs:   . Lack of Transportation (Medical): Not on file  . Lack of Transportation (Non-Medical): Not on file  Physical Activity:   . Days of Exercise per Week: Not on file  . Minutes of Exercise per Session: Not on file  Stress:   . Feeling of Stress : Not on file  Social Connections:   . Frequency of Communication with Friends and Family: Not on file  . Frequency of Social Gatherings with Friends and Family: Not on file  . Attends Religious Services: Not on file  . Active Member of Clubs or Organizations: Not on file  . Attends Archivist Meetings: Not on file  . Marital Status: Not on file  Intimate Partner Violence:   . Fear of Current or Ex-Partner: Not on file  . Emotionally Abused: Not on file  . Physically Abused: Not on file  . Sexually Abused: Not on file     PHYSICAL EXAM   Vitals:   03/14/19 0809  BP: (!) 121/59  Pulse: 66  Temp: (!) 97.4 F (36.3 C)    Not recorded      There is  no height or weight on file to calculate BMI.  PHYSICAL EXAMNIATION:  Gen: NAD, conversant, well nourised, well groomed                      Cardiovascular: Regular rate rhythm, no peripheral edema, warm, nontender. Eyes: Conjunctivae clear without exudates or hemorrhage Neck: Supple, no carotid bruits. Pulmonary: Clear to auscultation bilaterally   NEUROLOGICAL EXAM: Fragile elderly female, sitting in wheelchair,  MENTAL STATUS: Speech:    Speech is normal; fluent and spontaneous with normal comprehension.  Cognition:     Orientation to time, place and person     Normal recent and remote memory     Normal Attention span and concentration     Normal Language, naming, repeating,spontaneous speech     Fund of knowledge   CRANIAL NERVES: CN II: Visual fields are full to confrontation. Pupils are round equal and briskly reactive to light. CN III, IV, VI: extraocular movement are normal. No ptosis. CN V: Facial sensation is intact to light touch CN VII: Face is symmetric with normal eye closure  CN VIII: Hearing is normal to causal conversation. CN IX, X: Phonation is normal. CN XI: Head turning and shoulder shrug are intact  MOTOR: She has bilateral hands posturing tremor, normal strength, no rigidity, bradykinesia, occasionally head titubation.  Right lower extremity motor strength is limited because of right hip pain, there was no right distal weakness, left lower extremity proximal and distal muscle strength is normal.  REFLEXES: Reflexes are hypoactive and symmetric at the biceps, triceps, knees, and ankles. Plantar responses are flexor.  SENSORY: Intact to light touch, pinprick  COORDINATION: There is no trunk or limb dysmetria noted.  GAIT/STANCE: Deferred   DIAGNOSTIC DATA (LABS, IMAGING, TESTING) - I reviewed patient records, labs, notes, testing and imaging myself where available.   ASSESSMENT AND PLAN  Dawn [name] is a 79 y.o. female   New onset persistent headaches in elderly  MRI of brain to rule out structural lesion  ESR C-reactive protein to rule out temporal  arteritis  She had persistent 8 out of 10 headache failed over-the-counter medication treatment, will try nerve block/trigger point injection today  Essential tremor  Stop Artane to decrease the side effect in elderly  Continue primidone 50 mg 2 tablets every night  Add on Inderal LA 60 mg daily  Marcial Pacas, M.D. Ph.D.  [facility] [address], [place], [place] [address] Ph: [phone] Fax: [phone]  CC: [name], MD

## 2019-03-14 NOTE — Telephone Encounter (Signed)
Medicare/medicaid order sent to [facility]. No auth they will reach out to the patient to schedule.

## 2019-03-15 ENCOUNTER — Telehealth: Payer: Self-pay | Admitting: Neurology

## 2019-03-15 LAB — C-REACTIVE PROTEIN: CRP: 15 mg/L — ABNORMAL HIGH (ref 0–10)

## 2019-03-15 LAB — SEDIMENTATION RATE: Sed Rate: 9 mm/hr (ref 0–40)

## 2019-03-15 NOTE — Telephone Encounter (Addendum)
Dawn [name] resides at [facility] (ph: [phone], fax: [phone]). I spoke to [place] who took a written message of the lab results. They will also be faxed over for the patient's records.

## 2019-03-15 NOTE — Progress Notes (Signed)
   History: 79 year old female presented with frequent headaches since 2020, failed response to over-the-counter medication treatment.  Bilateral occipital and trigeminal nerve block; trigger point injection of bilateral cervical and upper trapezius muscles for intractable headache  Bupivacaine 0.5% was injected on the scalp bilaterally at several locations:  -On the occipital area of the head, 3 injections each side, 0.5 cc per injection at the midpoint between the mastoid process and the occipital protuberance. 2 other injections were done one finger breadth from the initial injection, one at a 10 o'clock position and the other at a 2 o'clock position.  -2 injections of 0.5 cc were done in the temporal regions, 2 fingerbreadths above the tragus of the ear, with the second injection one fingerbreadth posteriorly to the first.  -2 injections were done on the brow, 1 in the medial brow and one over the supraorbital nerve notch, with 0.1 cc for each injection  -1 injection each side of 0.5 cc was done anterior to the tragus of the ear for a trigeminal ganglion injection  -0.5 cc was injected into bilateral upper trapezius and bilateral upper cervical paraspinals   The patient tolerated the injections well, no complications of the procedure were noted. Injections were made with a 27-gauge needle.

## 2019-03-15 NOTE — Telephone Encounter (Signed)
Please call patient, mild elevated C-reactive protein in the setting of normal ESR has unknown clinical significance.

## 2019-03-18 NOTE — Telephone Encounter (Signed)
Labs faxed and confirmed to [facility].

## 2019-03-19 ENCOUNTER — Encounter: Payer: Self-pay | Admitting: Internal Medicine

## 2019-03-19 ENCOUNTER — Non-Acute Institutional Stay (SKILLED_NURSING_FACILITY): Payer: Medicare Other | Admitting: Internal Medicine

## 2019-03-19 DIAGNOSIS — D649 Anemia, unspecified: Secondary | ICD-10-CM | POA: Diagnosis not present

## 2019-03-19 DIAGNOSIS — G250 Essential tremor: Secondary | ICD-10-CM | POA: Diagnosis not present

## 2019-03-19 DIAGNOSIS — J449 Chronic obstructive pulmonary disease, unspecified: Secondary | ICD-10-CM | POA: Diagnosis not present

## 2019-03-19 DIAGNOSIS — I10 Essential (primary) hypertension: Secondary | ICD-10-CM

## 2019-03-19 DIAGNOSIS — E032 Hypothyroidism due to medicaments and other exogenous substances: Secondary | ICD-10-CM

## 2019-03-19 NOTE — Progress Notes (Signed)
NURSING HOME LOCATION:  [facility] ROOM NUMBER:  [id number]  CODE STATUS Full Code:    PCP:  [name] MD.  This is a nursing facility follow up of chronic medical diagnoses  Interim medical record and care since last [facility] visit was updated with review of diagnostic studies and change in clinical status since last visit were documented.  HPI: She is a permanent resident of facility with medical diagnoses of oxygen dependent COPD, spinal stenosis/cervical, polyneuropathy, CKD stage III, chronic diastolic heart failure, chronic depression, chronic pain syndrome, hypothyroidism, GERD, and essential tremor. She smoked for over 5 decades; she states typically half a pack a day or less.  Review of systems: She feels that her tremor is improved with the relatively low-dose propranolol.  She feels that her pulmonary status is essentially stable.   She describes some altered vision.  She feels as if there is some irritation of the right eye.   She also describes absence of taste.   She asked me if she had congestive heart failure.  She stated that her mother had this diagnosis.  She denies frank congestive heart failure signs and symptoms although she does wake up at night occasionally and takes nebulizer treatment with relief.  She does deny history of asthma or wheezing.  Her father did have asthma. She is concerned about several scattered skin lesions especially over the abdomen.  Constitutional: No fever, significant weight change, fatigue  Eyes: No  discharge ENT/mouth: No nasal congestion,  purulent discharge, earache, change in hearing, sore throat  Cardiovascular: No chest pain, palpitations, claudication, edema  Respiratory: No cough, sputum production, hemoptysis,  significant snoring, apnea   Gastrointestinal: No heartburn, dysphagia, abdominal pain, nausea /vomiting, rectal bleeding, melena, change in bowels Genitourinary: No dysuria, hematuria, pyuria,  incontinence, nocturia Dermatologic: No rash, pruritus Neurologic: No dizziness,  syncope, seizures Psychiatric: No insomnia, anorexia Endocrine: No change in hair/nails, excessive thirst, excessive hunger, excessive urination  Hematologic/lymphatic: No significant bruising, lymphadenopathy, abnormal bleeding Allergy/immunology: No itchy/watery eyes, significant sneezing, urticaria, angioedema  Physical exam:  Pertinent or positive findings: She appears somewhat chronically ill.  She was wearing nasal oxygen but the prongs were pointed outward as opposed to toward the nasopharynx.  The correct application was explained to her.There is a tiny laceration/ hematoma above the right eye. There is no evidence of conjunctivitis.  She is wearing an upper partial but not the lower plate.  Breath sounds are markedly decreased.  There was no wheezing.  Heart rhythm is slow and regular without definite prematures.  Pedal pulses are decreased.  She has benign nevi over the abdomen, 1 of which is slightly polypoid; the others are flat and somewhat keratotic.  She has bruising over the left forearm.  The DIP joints of the index fingers are deviated slightly. Slight clubbing is suggested.  She exhibits a slight intention tremor of hands.  General appearance:  no acute distress, increased work of breathing is present.   Lymphatic: No lymphadenopathy about the head, neck, axilla. Eyes: No conjunctival inflammation or lid edema is present. There is no scleral icterus. Ears:  External ear exam shows no significant lesions or deformities.   Nose:  External nasal examination shows no deformity or inflammation. Nasal mucosa are pink and moist without lesions, exudates Neck:  No thyromegaly, masses, tenderness noted.    Heart:  No gallop, murmur, click, rub .  Lungs:  without rhonchi, rales, rubs also. Abdomen: Bowel sounds are normal. Abdomen is soft  and nontender with no organomegaly, hernias, masses. GU: Deferred    Extremities:  No cyanosis, edema  Neurologic exam : Balance, Rhomberg, finger to nose testing could not be completed due to clinical state Skin: Warm & dry w/o tenting. No significant  rash.  See summary under each active problem in the Problem List with associated updated therapeutic plan

## 2019-03-19 NOTE — Assessment & Plan Note (Signed)
Blood pressure 110/54 and pulse 55 with the addition of the nonselective beta-blocker.  These parameters will be monitored in addition to the O2 sats.

## 2019-03-19 NOTE — Assessment & Plan Note (Signed)
01/09/2019 hemoglobin 9.7/hematocrit 29; 12/23/2018 hemoglobin 10.3/hematocrit 30. Monitor for bleeding dyscrasias.

## 2019-03-19 NOTE — Assessment & Plan Note (Addendum)
01/09/2019 TSH 4.60.  TSH goal should be High normal because of her past history of supraventricular tachycardia.

## 2019-03-19 NOTE — Patient Instructions (Signed)
See assessment and plan under each diagnosis in the problem list and acutely for this visit 

## 2019-03-19 NOTE — Assessment & Plan Note (Addendum)
O2 sats 91% on oxygen.  She will be observed for any bronchospastic component to her COPD in the context of the nonselective beta-blocker therapy for her tremor.She is clinically stable @ present.

## 2019-03-20 ENCOUNTER — Encounter: Payer: Self-pay | Admitting: Adult Health

## 2019-03-20 ENCOUNTER — Non-Acute Institutional Stay (SKILLED_NURSING_FACILITY): Payer: Medicare Other | Admitting: Adult Health

## 2019-03-20 DIAGNOSIS — I471 Supraventricular tachycardia, unspecified: Secondary | ICD-10-CM

## 2019-03-20 DIAGNOSIS — G894 Chronic pain syndrome: Secondary | ICD-10-CM | POA: Diagnosis not present

## 2019-03-20 DIAGNOSIS — G250 Essential tremor: Secondary | ICD-10-CM | POA: Diagnosis not present

## 2019-03-20 DIAGNOSIS — J309 Allergic rhinitis, unspecified: Secondary | ICD-10-CM

## 2019-03-20 DIAGNOSIS — Z7189 Other specified counseling: Secondary | ICD-10-CM | POA: Diagnosis not present

## 2019-03-20 DIAGNOSIS — E034 Atrophy of thyroid (acquired): Secondary | ICD-10-CM

## 2019-03-20 DIAGNOSIS — M6281 Muscle weakness (generalized): Secondary | ICD-10-CM | POA: Diagnosis not present

## 2019-03-20 DIAGNOSIS — I5032 Chronic diastolic (congestive) heart failure: Secondary | ICD-10-CM

## 2019-03-20 DIAGNOSIS — J449 Chronic obstructive pulmonary disease, unspecified: Secondary | ICD-10-CM

## 2019-03-20 DIAGNOSIS — Z79899 Other long term (current) drug therapy: Secondary | ICD-10-CM | POA: Diagnosis not present

## 2019-03-20 NOTE — Progress Notes (Signed)
Location:  [place] Room Number: 202-A Place of Service:  SNF (31) Provider:  Durenda Age, DNP, FNP-BC  Patient Care Team: [name], MD as PCP - General (Internal Medicine) Medina-Vargas, [name], NP as Nurse Practitioner (Internal Medicine)  Extended Emergency Contact Information Primary Emergency Contact: Hatfield,Lisa Address: [address]          [place], [place] [address] [place] Work Phone: [phone] Mobile Phone: [phone] Relation: Daughter  Code Status:  Full Code  Goals of care: Advanced Directive information Advanced Directives 03/19/2019  Does Patient Have a Medical Advance Directive? Yes  Type of Advance Directive (No Data)  Does patient want to make changes to medical advance directive? No - Patient declined  Would patient like information on creating a medical advance directive? -     Chief Complaint  Patient presents with  . Advance Care planning    Patient is seen for care planning.     HPI:  Pt is a 79 y.o. female seen today for an advanced care plan meeting. She is a long-term care resident of [facility]. She has PMH of COPD, spinal stenosis, depression and anxiety. The meeting was attended by the resident, NP, MDS coordinator, Radiation protection practitioner. She remains to be full code. On 02/25/19, she received her second Moderna COVID-19 vaccine. No adverse reaction was reported. She currently participates in restorative walking for at least 50 ft. She recently consulted the neurologist, Dr. Krista Blue, regarding tremors. Artane was discontinued, Primidone was decreased to 100 mg at bedtime and was started on Inderal LA 60 mg daily. She, also, had nerve block/trigger point injection on 03/14/19 for her headache. Trazodone 12.5 mg BID was recently ordered for her anxiety. The meeting lasted for 20 minutes.   Past Medical History:  Diagnosis Date  . Anemia   . Chronic  diastolic heart failure ([place])   . Chronic kidney disease, stage 3   . Chronic pain syndrome   . Constipation   . COPD (chronic obstructive pulmonary disease) ([place])   . Dysphagia   . Frequent falls   . Generalized muscle weakness   . GERD (gastroesophageal reflux disease)   . Headache   . Hip pain, right   . History of anxiety   . History of depression   . Hyperglycemia   . Hypothyroid   . Insomnia   . Metabolic encephalopathy   . Palpitation   . Polyneuropathy   . Spinal stenosis   . Spinal stenosis, cervical region   . Supraventricular tachycardia ([place])   . Thyrotoxicosis, unspecified with thyrotoxic crisis or storm   . Tremor   . Unsteadiness on feet   . UTI (urinary tract infection)    Past Surgical History:  Procedure Laterality Date  . ANKLE FRACTURE SURGERY Left   . CATARACT EXTRACTION, BILATERAL    . FLEXIBLE SIGMOIDOSCOPY Left 11/18/2015   Procedure: FLEXIBLE SIGMOIDOSCOPY;  Surgeon: [name], MD;  Location: [place];  Service: Endoscopy;  Laterality: Left;  . FLEXIBLE SIGMOIDOSCOPY N/A 11/20/2015   Procedure: FLEXIBLE SIGMOIDOSCOPY;  Surgeon: [name], MD;  Location[facility] ENDOSCOPY;  Service: Endoscopy;  Laterality: N/A;  . HIP ARTHROPLASTY Right 01/12/2016   Procedure: ARTHROPLASTY BIPOLAR HIP (HEMIARTHROPLASTY);  Surgeon: [name], MD;  Location: [facility];  Service: Orthopedics;  Laterality: Right;    Allergies  Allergen Reactions  . Biofreeze [Menthol (Topical Analgesic)] Rash    By history Aspercreme does not cause a rash  Outpatient Encounter Medications as of 03/20/2019  Medication Sig  . acetaminophen (TYLENOL) 325 MG tablet Take 650 mg by mouth every 6 (six) hours as needed.  Marland Kitchen alum & mag hydroxide-simeth (MAALOX PLUS) 400-400-40 MG/5ML suspension Give 30cc PO q2H PRN x 24H (call MD if indigestion is not relieved w/i 24H)  . aspirin 81 MG chewable tablet Chew 81 mg by mouth daily.   . digoxin (LANOXIN) 0.125 MG tablet Take 0.125 mg by mouth  daily. Hold if HR <60  . DULoxetine (CYMBALTA) 30 MG capsule TAKE 1 CAPSULE BY MOUTH ONCE DAILY FOR DEPRESSION/PAIN (TAKE WITH 60MG  TO=90MG ) (DO NOT CRUSH)  . DULoxetine (CYMBALTA) 60 MG capsule TAKE 1 CAPSULE BY MOUTH ONCE DAILY FOR DEPRESSION/PAIN (TAKE WITH 30MG  TO=90MG ) (DO NOT CRUSH)  . FLECAINIDE ACETATE PO Take 50 mg by mouth every 12 (twelve) hours.  . fluticasone (FLONASE ALLERGY RELIEF) 50 MCG/ACT nasal spray Place 1 spray into both nostrils daily.  . furosemide (LASIX) 20 MG tablet Take 20 mg by mouth daily. For CHF  . gabapentin (NEURONTIN) 100 MG capsule Take 2 capsules = 200 mg by mouth twice a day  . guaiFENesin (MUCINEX) 600 MG 12 hr tablet Take 600 mg by mouth 2 (two) times daily. COPD cough  . HYDROcodone-acetaminophen (NORCO/VICODIN) 5-325 MG tablet Give **1/2 tablet by mouth q 6 hrs PRN  . levalbuterol (XOPENEX) 1.25 MG/3ML nebulizer solution Take 1.25 mg by nebulization every 6 (six) hours as needed for wheezing.  Marland Kitchen levothyroxine (SYNTHROID, LEVOTHROID) 25 MCG tablet Take 50 mcg by mouth daily before breakfast. FOR HYPOTHYROIDISM  . Lidocaine (ASPERCREME LIDOCAINE) 4 % PTCH APPLY 1 PATCH TRANSDERMALLY TO LOWER BACK AT BEDTIME FOR OSTEOARTHRITIS  . loratadine (CLARITIN) 10 MG tablet Take 10 mg by mouth at bedtime.  . Multiple Vitamins-Minerals (MULTIVITAMIN WITH MINERALS) tablet Take 1 tablet by mouth daily.  . naphazoline-glycerin (CLEAR EYES REDNESS RELIEF) 0.012-0.2 % SOLN Place 1 drop into both eyes 2 (two) times daily.  . Nutritional Supplements (ENSURE PO) Take by mouth. ENSURE LIQUID Give 237 ml po daily  . ondansetron (ZOFRAN) 4 MG tablet Take 4 mg by mouth 2 (two) times daily.  . OXYGEN Inhale 2 L into the lungs at bedtime.  . pantoprazole (PROTONIX) 40 MG tablet Take 40 mg by mouth daily.  . polyethylene glycol (MIRALAX / GLYCOLAX) packet Take 17 g by mouth daily.  . predniSONE (DELTASONE) 5 MG tablet TAKE 1 TABLET BY MOUTH DAILY (WITH BREAKFAST) OH:YWVP  .  primidone (MYSOLINE) 50 MG tablet Take 100 mg by mouth every morning.   . primidone (MYSOLINE) 50 MG tablet Take 50 mg by mouth at bedtime.   . propranolol ER (INDERAL LA) 60 MG 24 hr capsule Take 1 capsule (60 mg total) by mouth daily.  . sennosides-docusate sodium (SENOKOT-S) 8.6-50 MG tablet Take 1 tablet by mouth 2 (two) times daily.   . traZODone (DESYREL) 50 MG tablet Take 1/2 tab =25 mg by mouth at bedtime  . traZODone (DESYREL) 50 MG tablet TAKE 1/4 TABLET (12.5MG ) BY MOUTH TWICE A DAY DX: ANXIETY  . umeclidinium bromide (INCRUSE ELLIPTA) 62.5 MCG/INH AEPB Inhale 1 puff into the lungs daily. (SHAKE WELL)   No facility-administered encounter medications on file as of 03/20/2019.    Review of Systems  GENERAL: No change in appetite, no fatigue, no weight changes, no fever, chills or weakness MOUTH and THROAT: Denies oral discomfort, gingival pain or bleeding RESPIRATORY: no cough, SOB, DOE, wheezing, hemoptysis CARDIAC: No chest pain, edema  or palpitations GI: No abdominal pain, diarrhea, constipation, heart burn, nausea or vomiting GU: Denies dysuria, frequency, hematuria, incontinence, or discharge NEUROLOGICAL: Denies dizziness, syncope, numbness, or headache PSYCHIATRIC: Denies feelings of depression or anxiety. No report of hallucinations, insomnia, paranoia, or agitation   Immunization History  Administered Date(s) Administered  . Influenza, High Dose Seasonal PF 10/04/2016  . Influenza,inj,Quad PF,6+ Mos 01/13/2016  . Influenza-Unspecified 11/10/2017, 10/09/2018  . Moderna SARS-COVID-2 Vaccination 01/28/2019, 02/25/2019  . Pneumococcal-Unspecified 10/11/2011  . Tdap 01/23/2016   Pertinent  Health Maintenance Due  Topic Date Due  . INFLUENZA VACCINE  Completed  . PNA vac Low Risk Adult  Completed  . DEXA SCAN  Discontinued   Fall Risk  10/24/2018 07/19/2018 01/17/2018 11/09/2017 07/14/2017  Falls in the past year? 0 0 0 No No  Number falls in past yr: 0 0 0 - -  Injury  with Fall? 0 0 0 - -  Comment - - - - -  Risk Factor Category  - - - - -  Follow up - - Falls evaluation completed - -     Vitals:   03/20/19 1029  BP: 128/70  Pulse: (!) 57  Resp: 20  Temp: 98.6 F (37 C)  TempSrc: Oral  SpO2: 100%  Weight: 142 lb 3.2 oz (64.5 kg)  Height: 5\' 8"  (1.727 m)   Body mass index is 21.62 kg/m.  Physical Exam  GENERAL APPEARANCE: Well nourished. In no acute distress. Normal body habitus SKIN:  Skin is warm and dry.  MOUTH and THROAT: Lips are without lesions. Oral mucosa is moist and without lesions. Tongue is normal in shape, size, and color and without lesions RESPIRATORY: Breathing is even & unlabored, BS CTAB CARDIAC: RRR, no murmur,no extra heart sounds, no edema GI: Abdomen soft, normal BS, no masses, no tenderness NEUROLOGICAL: Speech is clear. Alert and oriented X 3. PSYCHIATRIC:  Affect and behavior are appropriate  Labs reviewed: Recent Labs    11/23/18 0000 01/05/19 0000  NA 137 135*  K 4.7 4.6  CL 99 96*  CO2 28* 23*  BUN 14 12  CREATININE 1.0 0.9  CALCIUM 8.7 9.2    Recent Labs    08/17/18 0000 11/23/18 0000 01/09/19 0000  WBC 6.0 4.4 5.0  HGB 10.5* 10.3* 9.7*  HCT 31* 30* 29*  PLT 270 225 236   Lab Results  Component Value Date   TSH 2.82 02/20/2019   Lab Results  Component Value Date   HGBA1C 5.2 12/01/2015   Lab Results  Component Value Date   CHOL 107 12/01/2015   HDL 27 (L) 12/01/2015   LDLCALC 59 12/01/2015   TRIG 107 12/01/2015   CHOLHDL 4.0 12/01/2015    Significant Diagnostic Results in last 30 days:  XR C-ARM NO REPORT  Result Date: 02/25/2019 Please see Notes tab for imaging impression.   Assessment/Plan  1. Advance care planning - remains to be full code - discussed medications, vital signs and weights  2. Hypothyroidism due to acquired atrophy of thyroid Lab Results  Component Value Date   TSH 2.82 02/20/2019   - continue Levothyroxine  3. Chronic obstructive pulmonary  disease, unspecified COPD type ([place]) - no wheezing, stable, continue O2 @ 2L/min via [place], Prednisone, [place] Ellipta and PRN Levalbuterol  4. Chronic diastolic CHF (congestive heart failure) (H[facility]) - stable, continue Lasix and digoxin  5. Essential tremor -Recently started on Inderal, Artane was discontinued and continue primidone 100 mg at bedtime  6. Paroxysmal SVT (supraventricular  tachycardia) ([place]) - history of, rate controlled, continue flecainide and digoxin  7. Allergic rhinitis, unspecified seasonality, unspecified trigger -Stable, continue loratadine and fluticasone nasal spray  8. Chronic pain syndrome -Stable, continue gabapentin, PRN Acetaminophen and PRN Norco    Family/ staff Communication:  Discussed plan of care with resident and IDT.  Labs/tests ordered:  None  Goals of care:  Long-term care   Durenda Age, DNP, FNP-BC [facility] [phone] (Monday-Friday 8:00 a.m. - 5:00 p.m.) [phone] (after hours)

## 2019-03-20 NOTE — Assessment & Plan Note (Signed)
Subjectively & clinically improved

## 2019-03-27 DIAGNOSIS — F329 Major depressive disorder, single episode, unspecified: Secondary | ICD-10-CM | POA: Diagnosis not present

## 2019-03-27 DIAGNOSIS — G4700 Insomnia, unspecified: Secondary | ICD-10-CM | POA: Diagnosis not present

## 2019-03-27 DIAGNOSIS — F419 Anxiety disorder, unspecified: Secondary | ICD-10-CM | POA: Diagnosis not present

## 2019-03-29 DIAGNOSIS — G629 Polyneuropathy, unspecified: Secondary | ICD-10-CM | POA: Diagnosis not present

## 2019-03-29 DIAGNOSIS — R293 Abnormal posture: Secondary | ICD-10-CM | POA: Diagnosis not present

## 2019-03-29 DIAGNOSIS — S82842D Displaced bimalleolar fracture of left lower leg, subsequent encounter for closed fracture with routine healing: Secondary | ICD-10-CM | POA: Diagnosis not present

## 2019-03-29 DIAGNOSIS — M6281 Muscle weakness (generalized): Secondary | ICD-10-CM | POA: Diagnosis not present

## 2019-03-31 DIAGNOSIS — M6281 Muscle weakness (generalized): Secondary | ICD-10-CM | POA: Diagnosis not present

## 2019-03-31 DIAGNOSIS — G629 Polyneuropathy, unspecified: Secondary | ICD-10-CM | POA: Diagnosis not present

## 2019-03-31 DIAGNOSIS — R293 Abnormal posture: Secondary | ICD-10-CM | POA: Diagnosis not present

## 2019-03-31 DIAGNOSIS — S82842D Displaced bimalleolar fracture of left lower leg, subsequent encounter for closed fracture with routine healing: Secondary | ICD-10-CM | POA: Diagnosis not present

## 2019-04-02 DIAGNOSIS — G629 Polyneuropathy, unspecified: Secondary | ICD-10-CM | POA: Diagnosis not present

## 2019-04-02 DIAGNOSIS — R293 Abnormal posture: Secondary | ICD-10-CM | POA: Diagnosis not present

## 2019-04-02 DIAGNOSIS — M6281 Muscle weakness (generalized): Secondary | ICD-10-CM | POA: Diagnosis not present

## 2019-04-02 DIAGNOSIS — S82842D Displaced bimalleolar fracture of left lower leg, subsequent encounter for closed fracture with routine healing: Secondary | ICD-10-CM | POA: Diagnosis not present

## 2019-04-03 DIAGNOSIS — M6281 Muscle weakness (generalized): Secondary | ICD-10-CM | POA: Diagnosis not present

## 2019-04-03 DIAGNOSIS — R293 Abnormal posture: Secondary | ICD-10-CM | POA: Diagnosis not present

## 2019-04-03 DIAGNOSIS — G629 Polyneuropathy, unspecified: Secondary | ICD-10-CM | POA: Diagnosis not present

## 2019-04-03 DIAGNOSIS — S82842D Displaced bimalleolar fracture of left lower leg, subsequent encounter for closed fracture with routine healing: Secondary | ICD-10-CM | POA: Diagnosis not present

## 2019-04-04 DIAGNOSIS — R293 Abnormal posture: Secondary | ICD-10-CM | POA: Diagnosis not present

## 2019-04-04 DIAGNOSIS — M6281 Muscle weakness (generalized): Secondary | ICD-10-CM | POA: Diagnosis not present

## 2019-04-04 DIAGNOSIS — G629 Polyneuropathy, unspecified: Secondary | ICD-10-CM | POA: Diagnosis not present

## 2019-04-04 DIAGNOSIS — S82842D Displaced bimalleolar fracture of left lower leg, subsequent encounter for closed fracture with routine healing: Secondary | ICD-10-CM | POA: Diagnosis not present

## 2019-04-08 DIAGNOSIS — M6281 Muscle weakness (generalized): Secondary | ICD-10-CM | POA: Diagnosis not present

## 2019-04-08 DIAGNOSIS — R293 Abnormal posture: Secondary | ICD-10-CM | POA: Diagnosis not present

## 2019-04-08 DIAGNOSIS — S82842D Displaced bimalleolar fracture of left lower leg, subsequent encounter for closed fracture with routine healing: Secondary | ICD-10-CM | POA: Diagnosis not present

## 2019-04-08 DIAGNOSIS — G629 Polyneuropathy, unspecified: Secondary | ICD-10-CM | POA: Diagnosis not present

## 2019-04-09 DIAGNOSIS — M6281 Muscle weakness (generalized): Secondary | ICD-10-CM | POA: Diagnosis not present

## 2019-04-09 DIAGNOSIS — S82842D Displaced bimalleolar fracture of left lower leg, subsequent encounter for closed fracture with routine healing: Secondary | ICD-10-CM | POA: Diagnosis not present

## 2019-04-09 DIAGNOSIS — G629 Polyneuropathy, unspecified: Secondary | ICD-10-CM | POA: Diagnosis not present

## 2019-04-09 DIAGNOSIS — R293 Abnormal posture: Secondary | ICD-10-CM | POA: Diagnosis not present

## 2019-04-10 DIAGNOSIS — M6281 Muscle weakness (generalized): Secondary | ICD-10-CM | POA: Diagnosis not present

## 2019-04-10 DIAGNOSIS — F329 Major depressive disorder, single episode, unspecified: Secondary | ICD-10-CM | POA: Diagnosis not present

## 2019-04-10 DIAGNOSIS — F419 Anxiety disorder, unspecified: Secondary | ICD-10-CM | POA: Diagnosis not present

## 2019-04-10 DIAGNOSIS — R293 Abnormal posture: Secondary | ICD-10-CM | POA: Diagnosis not present

## 2019-04-10 DIAGNOSIS — G4700 Insomnia, unspecified: Secondary | ICD-10-CM | POA: Diagnosis not present

## 2019-04-10 DIAGNOSIS — G629 Polyneuropathy, unspecified: Secondary | ICD-10-CM | POA: Diagnosis not present

## 2019-04-10 DIAGNOSIS — S82842D Displaced bimalleolar fracture of left lower leg, subsequent encounter for closed fracture with routine healing: Secondary | ICD-10-CM | POA: Diagnosis not present

## 2019-04-11 DIAGNOSIS — M25511 Pain in right shoulder: Secondary | ICD-10-CM | POA: Diagnosis not present

## 2019-04-11 DIAGNOSIS — M6281 Muscle weakness (generalized): Secondary | ICD-10-CM | POA: Diagnosis not present

## 2019-04-11 DIAGNOSIS — G629 Polyneuropathy, unspecified: Secondary | ICD-10-CM | POA: Diagnosis not present

## 2019-04-11 DIAGNOSIS — R293 Abnormal posture: Secondary | ICD-10-CM | POA: Diagnosis not present

## 2019-04-11 DIAGNOSIS — I5032 Chronic diastolic (congestive) heart failure: Secondary | ICD-10-CM | POA: Diagnosis not present

## 2019-04-16 ENCOUNTER — Non-Acute Institutional Stay (SKILLED_NURSING_FACILITY): Payer: Medicare Other | Admitting: Adult Health

## 2019-04-16 ENCOUNTER — Encounter: Payer: Self-pay | Admitting: Adult Health

## 2019-04-16 DIAGNOSIS — G250 Essential tremor: Secondary | ICD-10-CM | POA: Diagnosis not present

## 2019-04-16 DIAGNOSIS — G894 Chronic pain syndrome: Secondary | ICD-10-CM | POA: Diagnosis not present

## 2019-04-16 DIAGNOSIS — F419 Anxiety disorder, unspecified: Secondary | ICD-10-CM

## 2019-04-16 NOTE — Progress Notes (Signed)
Location:  [place] Room Number: 202/A Place of Service:  SNF (31) Provider:  Durenda Age, DNP, FNP-BC  Patient Care Team: [name], MD as PCP - General (Internal Medicine) Medina-Vargas, [name], NP as Nurse Practitioner (Internal Medicine)  Extended Emergency Contact Information Primary Emergency Contact: Hatfield,Lisa Address: [address]          [place], [place] [address] [place] Work Phone: [phone] Mobile Phone: [phone] Relation: Daughter  Code Status:  Full Code  Goals of care: Advanced Directive information Advanced Directives 04/16/2019  Does Patient Have a Medical Advance Directive? Yes  Type of Advance Directive (No Data)  Does patient want to make changes to medical advance directive? No - Patient declined  Would patient like information on creating a medical advance directive? -     Chief Complaint  Patient presents with  . Acute Visit    Medication Management    HPI:  Pt is a 79 y.o. female who is currently taking Neurontin 200 mg BID for chronic pain /tremors. She received a letter from her insurance stating that Neurontin is no longer covered by her insurance. She complains of being sleepy during the day. She currently takes PRN Norco for pain. For essential tremors, she also takes Primidone and Inderal. For anxiety, she takes Trazodone 50 mg BID and 25 mg at bedtime. She has PMH of COPD, spinal stenosis, depression and anxiety. She is a long-term care resident of [facility].    Past Medical History:  Diagnosis Date  . Anemia   . Chronic diastolic heart failure ([place])   . Chronic kidney disease, stage 3   . Chronic pain syndrome   . Constipation   . COPD (chronic obstructive pulmonary disease) ([place])   . Dysphagia   . Frequent falls   . Generalized muscle weakness   . GERD (gastroesophageal reflux disease)   . Headache   . Hip pain, right   . History of anxiety    . History of depression   . Hyperglycemia   . Hypothyroid   . Insomnia   . Metabolic encephalopathy   . Palpitation   . Polyneuropathy   . Spinal stenosis   . Spinal stenosis, cervical region   . Supraventricular tachycardia ([place])   . Thyrotoxicosis, unspecified with thyrotoxic crisis or storm   . Tremor   . Unsteadiness on feet   . UTI (urinary tract infection)    Past Surgical History:  Procedure Laterality Date  . ANKLE FRACTURE SURGERY Left   . CATARACT EXTRACTION, BILATERAL    . FLEXIBLE SIGMOIDOSCOPY Left 11/18/2015   Procedure: FLEXIBLE SIGMOIDOSCOPY;  Surgeon: [name], MD;  Location: [place];  Service: Endoscopy;  Laterality: Left;  . FLEXIBLE SIGMOIDOSCOPY N/A 11/20/2015   Procedure: FLEXIBLE SIGMOIDOSCOPY;  Surgeon: [name], MD;  Location: [facility] ENDOSCOPY;  Service: Endoscopy;  Laterality: N/A;  . HIP ARTHROPLASTY Right 01/12/2016   Procedure: ARTHROPLASTY BIPOLAR HIP (HEMIARTHROPLASTY);  Surgeon: [name], MD;  Location: [facility];  Service: Orthopedics;  Laterality: Right;    Allergies  Allergen Reactions  . Biofreeze [Menthol (Topical Analgesic)] Rash    By history Aspercreme does not cause a rash    Outpatient Encounter Medications as of 04/16/2019  Medication Sig  . acetaminophen (TYLENOL) 325 MG tablet Take 650 mg by mouth every 6 (six) hours as needed.  Marland Kitchen alum & mag hydroxide-simeth (MAALOX PLUS) 400-400-40 MG/5ML suspension Give 30cc PO q2H PRN x 24H (call MD if  indigestion is not relieved w/i 24H)  . aspirin 81 MG chewable tablet Chew 81 mg by mouth daily.   . digoxin (LANOXIN) 0.125 MG tablet Take 0.125 mg by mouth daily. Hold if HR <60  . DULoxetine (CYMBALTA) 60 MG capsule Take 120 mg by mouth daily. For Depression  . FLECAINIDE ACETATE PO Take 50 mg by mouth every 12 (twelve) hours.  . fluticasone (FLONASE ALLERGY RELIEF) 50 MCG/ACT nasal spray Place 1 spray into both nostrils daily.  . furosemide (LASIX) 20 MG tablet Take 20 mg by mouth daily.  For CHF  . gabapentin (NEURONTIN) 100 MG capsule Take 2 capsules = 200 mg by mouth twice a day  . guaiFENesin (MUCINEX) 600 MG 12 hr tablet Take 600 mg by mouth 2 (two) times daily. COPD cough  . HYDROcodone-acetaminophen (NORCO/VICODIN) 5-325 MG tablet Give **1/2 tablet by mouth q 6 hrs PRN  . levalbuterol (XOPENEX) 1.25 MG/3ML nebulizer solution Take 1.25 mg by nebulization every 6 (six) hours as needed for wheezing.  Marland Kitchen levothyroxine (SYNTHROID, LEVOTHROID) 25 MCG tablet Take 50 mcg by mouth daily before breakfast. FOR HYPOTHYROIDISM  . loratadine (CLARITIN) 10 MG tablet Take 10 mg by mouth at bedtime.  . Multiple Vitamins-Minerals (MULTIVITAMIN WITH MINERALS) tablet Take 1 tablet by mouth daily.  . naphazoline-glycerin (CLEAR EYES REDNESS RELIEF) 0.012-0.2 % SOLN Place 1 drop into both eyes 2 (two) times daily.  . Nutritional Supplements (ENSURE PO) Take by mouth. ENSURE LIQUID Give 237 ml po daily  . ondansetron (ZOFRAN) 4 MG tablet Take 4 mg by mouth 2 (two) times daily.  . OXYGEN Inhale 2 L into the lungs at bedtime.  . pantoprazole (PROTONIX) 40 MG tablet Take 40 mg by mouth daily.  . polyethylene glycol (MIRALAX / GLYCOLAX) packet Take 17 g by mouth daily.  . polyvinyl alcohol (ARTIFICIAL TEARS) 1.4 % ophthalmic solution Place 2 drops into both eyes 2 (two) times daily. For dry eyes  . predniSONE (DELTASONE) 5 MG tablet TAKE 1 TABLET BY MOUTH DAILY (WITH BREAKFAST) OH:YWVP  . primidone (MYSOLINE) 50 MG tablet Take 100 mg by mouth at bedtime.   . propranolol ER (INDERAL LA) 60 MG 24 hr capsule Take 1 capsule (60 mg total) by mouth daily.  . sennosides-docusate sodium (SENOKOT-S) 8.6-50 MG tablet Take 1 tablet by mouth 2 (two) times daily.   . traZODone (DESYREL) 50 MG tablet Take 1/2 tab =25 mg by mouth at bedtime  . traZODone (DESYREL) 50 MG tablet TAKE 1/4 TABLET (12.5MG ) BY MOUTH TWICE A DAY DX: ANXIETY  . umeclidinium bromide (INCRUSE ELLIPTA) 62.5 MCG/INH AEPB Inhale 1 puff into the  lungs daily. (SHAKE WELL)  . [DISCONTINUED] DULoxetine (CYMBALTA) 30 MG capsule TAKE 1 CAPSULE BY MOUTH ONCE DAILY FOR DEPRESSION/PAIN (TAKE WITH 60MG  TO=90MG ) (DO NOT CRUSH)  . [DISCONTINUED] Lidocaine (ASPERCREME LIDOCAINE) 4 % PTCH APPLY 1 PATCH TRANSDERMALLY TO LOWER BACK AT BEDTIME FOR OSTEOARTHRITIS  . [DISCONTINUED] primidone (MYSOLINE) 50 MG tablet Take 100 mg by mouth at bedtime.    No facility-administered encounter medications on file as of 04/16/2019.    Review of Systems  GENERAL: No change in appetite, no fatigue, no weight changes, no fever, chills or weakness MOUTH and THROAT: Denies oral discomfort, gingival pain or bleeding RESPIRATORY: no cough, SOB, DOE, wheezing, hemoptysis CARDIAC: No chest pain, edema or palpitations GI: No abdominal pain, diarrhea, constipation, heart burn, nausea or vomiting GU: Denies dysuria, frequency, hematuria, incontinence, or discharge NEUROLOGICAL: Sleepy during the day PSYCHIATRIC: Denies feelings of  depression or anxiety. No report of hallucinations, insomnia, paranoia, or agitation   Immunization History  Administered Date(s) Administered  . Influenza, High Dose Seasonal PF 10/04/2016  . Influenza,inj,Quad PF,6+ Mos 01/13/2016  . Influenza-Unspecified 11/10/2017, 10/09/2018  . Moderna SARS-COVID-2 Vaccination 01/28/2019, 02/25/2019  . Pneumococcal-Unspecified 10/11/2011  . Tdap 01/23/2016   Pertinent  Health Maintenance Due  Topic Date Due  . INFLUENZA VACCINE  08/11/2019  . PNA vac Low Risk Adult  Completed  . DEXA SCAN  Discontinued   Fall Risk  10/24/2018 07/19/2018 01/17/2018 11/09/2017 07/14/2017  Falls in the past year? 0 0 0 No No  Number falls in past yr: 0 0 0 - -  Injury with Fall? 0 0 0 - -  Comment - - - - -  Risk Factor Category  - - - - -  Follow up - - Falls evaluation completed - -     Vitals:   04/16/19 1200  BP: 108/65  Pulse: 68  Resp: 16  Temp: 97.7 F (36.5 C)  TempSrc: Oral  SpO2: 98%  Weight: 142  lb 3.2 oz (64.5 kg)  Height: 5\' 8"  (1.727 m)   Body mass index is 21.62 kg/m.  Physical Exam  GENERAL APPEARANCE: Well nourished. In no acute distress. Normal body habitus SKIN:  Skin is warm and dry.  MOUTH and THROAT: Lips are without lesions. Oral mucosa is moist and without lesions. Tongue is normal in shape, size, and color and without lesions RESPIRATORY: Breathing is even & unlabored, BS CTAB CARDIAC: RRR, no murmur,no extra heart sounds, no edema GI: Abdomen soft, normal BS, no masses, no tenderness EXTREMITIES:  Able to move X 4 extremities NEUROLOGICAL: + tremor. Speech is clear.  Alert and oriented X 3. PSYCHIATRIC:  Affect and behavior are appropriate  Labs reviewed: Recent Labs    11/23/18 0000 01/05/19 0000  NA 137 135*  K 4.7 4.6  CL 99 96*  CO2 28* 23*  BUN 14 12  CREATININE 1.0 0.9  CALCIUM 8.7 9.2    Recent Labs    08/17/18 0000 11/23/18 0000 01/09/19 0000  WBC 6.0 4.4 5.0  HGB 10.5* 10.3* 9.7*  HCT 31* 30* 29*  PLT 270 225 236   Lab Results  Component Value Date   TSH 2.82 02/20/2019   Lab Results  Component Value Date   HGBA1C 5.2 12/01/2015   Lab Results  Component Value Date   CHOL 107 12/01/2015   HDL 27 (L) 12/01/2015   LDLCALC 59 12/01/2015   TRIG 107 12/01/2015   CHOLHDL 4.0 12/01/2015     Assessment/Plan  1. Chronic pain syndrome - Neurontin is thought to be contributing to her sleepiness in the morning  - will decrease Neurontin from 200 mg BID to 200 mg daily X 4 days then 100 mg daily X 4 days then discontinue - continue PRN Norco, PRN Acetaminophen and Duloxetine  2. Essential tremor - continue Primidone and Inderal  3. Anxiety - continue Trazodone - followed up by psych NP     Family/ staff Communication:   Discussed plan of care with resident and charge nurse.  Labs/tests ordered:  None  Goals of care:   Long-term care   Durenda Age, DNP, FNP-BC [facility] [phone] (Monday-Friday 8:00 a.m. - 5:00 p.m.) [phone] (after hours)

## 2019-04-22 ENCOUNTER — Encounter: Payer: Self-pay | Admitting: Adult Health

## 2019-04-22 ENCOUNTER — Non-Acute Institutional Stay (SKILLED_NURSING_FACILITY): Payer: Medicare Other | Admitting: Adult Health

## 2019-04-22 DIAGNOSIS — J309 Allergic rhinitis, unspecified: Secondary | ICD-10-CM | POA: Diagnosis not present

## 2019-04-22 DIAGNOSIS — E032 Hypothyroidism due to medicaments and other exogenous substances: Secondary | ICD-10-CM

## 2019-04-22 DIAGNOSIS — R400 Somnolence: Secondary | ICD-10-CM | POA: Diagnosis not present

## 2019-04-22 DIAGNOSIS — G250 Essential tremor: Secondary | ICD-10-CM | POA: Diagnosis not present

## 2019-04-22 DIAGNOSIS — J449 Chronic obstructive pulmonary disease, unspecified: Secondary | ICD-10-CM | POA: Diagnosis not present

## 2019-04-22 DIAGNOSIS — I5032 Chronic diastolic (congestive) heart failure: Secondary | ICD-10-CM

## 2019-04-22 DIAGNOSIS — G894 Chronic pain syndrome: Secondary | ICD-10-CM

## 2019-04-22 DIAGNOSIS — I471 Supraventricular tachycardia: Secondary | ICD-10-CM

## 2019-04-22 MED ORDER — HYDROCODONE-ACETAMINOPHEN 5-325 MG PO TABS: ORAL_TABLET | ORAL | 0 refills | Status: DC

## 2019-04-22 NOTE — Progress Notes (Signed)
Location:  [facility] Room Number: 833-A Place of Service:  SNF (31) Provider:  Durenda Age, DNP, FNP-BC  Patient Care Team: [name], MD as PCP - General (Internal Medicine) Medina-Vargas, [name], NP as Nurse Practitioner (Internal Medicine)  Extended Emergency Contact Information Primary Emergency Contact: Dawn [name] Address: [address]          [place], [place] [address] [place] Work Phone: [phone] Mobile Phone: [phone] Relation: Daughter  Code Status:  Full Code  Goals of care: Advanced Directive information Advanced Directives 04/16/2019  Does Patient Have a Medical Advance Directive? Yes  Type of Advance Directive (No Data)  Does patient want to make changes to medical advance directive? No - Patient declined  Would patient like information on creating a medical advance directive? -     Chief Complaint  Patient presents with  . Medical Management of Chronic Issues    Routine [facility] visit    HPI:  Pt is a 79 y.o. female seen today for medical management of chronic diseases.  She is a long-term care resident of [facility].  She has a PMH of COPD, spinal stenosis, depression and anxiety. She is currently participating in restorative walking. She was seen by the dentist on 04/17/19 for a routine visit. She has complained that she feels sleepy during the day. It was reported that Gabapentin is no longer covered by her insurance. Discussed with resident and has agreed to taper off on her Gabapentin which will help eliminate being sleepy in the morning.   Past Medical History:  Diagnosis Date  . Anemia   . Chronic diastolic heart failure ([place])   . Chronic kidney disease, stage 3   . Chronic pain syndrome   . Constipation   . COPD (chronic obstructive pulmonary disease) ([place])   . Dysphagia   . Frequent falls   . Generalized muscle weakness   . GERD (gastroesophageal  reflux disease)   . Headache   . Hip pain, right   . History of anxiety   . History of depression   . Hyperglycemia   . Hypothyroid   . Insomnia   . Metabolic encephalopathy   . Palpitation   . Polyneuropathy   . Spinal stenosis   . Spinal stenosis, cervical region   . Supraventricular tachycardia ([place])   . Thyrotoxicosis, unspecified with thyrotoxic crisis or storm   . Tremor   . Unsteadiness on feet   . UTI (urinary tract infection)    Past Surgical History:  Procedure Laterality Date  . ANKLE FRACTURE SURGERY Left   . CATARACT EXTRACTION, BILATERAL    . FLEXIBLE SIGMOIDOSCOPY Left 11/18/2015   Procedure: FLEXIBLE SIGMOIDOSCOPY;  Surgeon: [name], MD;  Location: [facility];  Service: Endoscopy;  Laterality: Left;  . FLEXIBLE SIGMOIDOSCOPY N/A 11/20/2015   Procedure: FLEXIBLE SIGMOIDOSCOPY;  Surgeon: [name], MD;  Location: [facility] ENDOSCOPY;  Service: Endoscopy;  Laterality: N/A;  . HIP ARTHROPLASTY Right 01/12/2016   Procedure: ARTHROPLASTY BIPOLAR HIP (HEMIARTHROPLASTY);  Surgeon: [name], MD;  Location: [facility];  Service: Orthopedics;  Laterality: Right;    Allergies  Allergen Reactions  . Biofreeze [Menthol (Topical Analgesic)] Rash    By history Aspercreme does not cause a rash    Outpatient Encounter Medications as of 04/22/2019  Medication Sig  . acetaminophen (TYLENOL) 325 MG tablet Take 650 mg by mouth See admin instructions. q12h scheduled and q12h prn  . alum & mag hydroxide-simeth (MAALOX PLUS)  400-400-40 MG/5ML suspension Give 30cc PO q2H PRN x 24H (call MD if indigestion is not relieved w/i 24H)  . aspirin 81 MG chewable tablet Chew 81 mg by mouth daily.   . digoxin (LANOXIN) 0.125 MG tablet Take 0.125 mg by mouth daily. Hold if HR <60  . DULoxetine (CYMBALTA) 60 MG capsule Take 120 mg by mouth daily. For Depression  . flecainide (TAMBOCOR) 50 MG tablet Take 50 mg by mouth every 12 (twelve) hours.  . fluticasone (FLONASE ALLERGY RELIEF) 50 MCG/ACT nasal  spray Place 1 spray into both nostrils daily.  . furosemide (LASIX) 20 MG tablet Take 20 mg by mouth daily. For CHF  . gabapentin (NEURONTIN) 100 MG capsule Take 100 mg by mouth daily.  Marland Kitchen guaiFENesin (MUCINEX) 600 MG 12 hr tablet Take 600 mg by mouth 2 (two) times daily. COPD cough  . HYDROcodone-acetaminophen (NORCO/VICODIN) 5-325 MG tablet Give **1/2 tablet by mouth q 6 hrs PRN  . levalbuterol (XOPENEX) 1.25 MG/3ML nebulizer solution Take 1.25 mg by nebulization every 6 (six) hours as needed for wheezing.  Marland Kitchen levothyroxine (SYNTHROID, LEVOTHROID) 25 MCG tablet Take 50 mcg by mouth daily before breakfast. FOR HYPOTHYROIDISM  . loratadine (CLARITIN) 10 MG tablet Take 10 mg by mouth at bedtime.  . Multiple Vitamins-Minerals (MULTIVITAMIN WITH MINERALS) tablet Take 1 tablet by mouth daily.  . naphazoline-glycerin (CLEAR EYES REDNESS RELIEF) 0.012-0.2 % SOLN Place 1 drop into both eyes 2 (two) times daily.  . Nutritional Supplements (ENSURE PO) Take by mouth. ENSURE LIQUID Give 237 ml po daily  . ondansetron (ZOFRAN) 4 MG tablet Take 4 mg by mouth 2 (two) times daily as needed for nausea.   . OXYGEN Inhale 2 L into the lungs at bedtime.  . pantoprazole (PROTONIX) 40 MG tablet Take 40 mg by mouth daily.  . polyethylene glycol (MIRALAX / GLYCOLAX) packet Take 17 g by mouth daily.  . polyvinyl alcohol (ARTIFICIAL TEARS) 1.4 % ophthalmic solution Place 2 drops into both eyes 2 (two) times daily. For dry eyes  . predniSONE (DELTASONE) 5 MG tablet TAKE 1 TABLET BY MOUTH DAILY (WITH BREAKFAST) NI:OEVO  . primidone (MYSOLINE) 50 MG tablet Take 100 mg by mouth at bedtime.   . propranolol ER (INDERAL LA) 60 MG 24 hr capsule Take 1 capsule (60 mg total) by mouth daily.  . sennosides-docusate sodium (SENOKOT-S) 8.6-50 MG tablet Take 1 tablet by mouth 2 (two) times daily.   . traZODone (DESYREL) 50 MG tablet Take 1/2 tab =25 mg by mouth at bedtime  . traZODone (DESYREL) 50 MG tablet TAKE 1/4 TABLET (12.5MG ) BY  MOUTH TWICE A DAY DX: ANXIETY  . umeclidinium bromide (INCRUSE ELLIPTA) 62.5 MCG/INH AEPB Inhale 1 puff into the lungs daily. (SHAKE WELL)  . [DISCONTINUED] FLECAINIDE ACETATE PO Take 50 mg by mouth every 12 (twelve) hours.  . [DISCONTINUED] gabapentin (NEURONTIN) 100 MG capsule Take 2 capsules = 200 mg by mouth twice a day   No facility-administered encounter medications on file as of 04/22/2019.    Review of Systems  GENERAL: No change in appetite, no fatigue, no weight changes, no fever, chills or weakness MOUTH and THROAT: Denies oral discomfort, gingival pain or bleeding, pain from teeth or hoarseness   RESPIRATORY: no cough, SOB, DOE, wheezing, hemoptysis CARDIAC: No chest pain, edema or palpitations GI: No abdominal pain, diarrhea, constipation, heart burn, nausea or vomiting GU: Denies dysuria, frequency, hematuria, incontinence, or discharge NEUROLOGICAL: Denies dizziness, syncope, numbness, or headache PSYCHIATRIC: Denies feelings of depression or  anxiety. No report of hallucinations, insomnia, paranoia, or agitation   Immunization History  Administered Date(s) Administered  . Influenza, High Dose Seasonal PF 10/04/2016  . Influenza,inj,Quad PF,6+ Mos 01/13/2016  . Influenza-Unspecified 11/10/2017, 10/09/2018  . Moderna SARS-COVID-2 Vaccination 01/28/2019, 02/25/2019  . Pneumococcal-Unspecified 10/11/2011  . Tdap 01/23/2016   Pertinent  Health Maintenance Due  Topic Date Due  . INFLUENZA VACCINE  08/11/2019  . PNA vac Low Risk Adult  Completed  . DEXA SCAN  Discontinued   Fall Risk  10/24/2018 07/19/2018 01/17/2018 11/09/2017 07/14/2017  Falls in the past year? 0 0 0 No No  Number falls in past yr: 0 0 0 - -  Injury with Fall? 0 0 0 - -  Comment - - - - -  Risk Factor Category  - - - - -  Follow up - - Falls evaluation completed - -     Vitals:   04/22/19 1138  BP: 112/70  Pulse: 96  Resp: 20  Temp: (!) 97.3 F (36.3 C)  TempSrc: Oral  SpO2: 99%  Weight: 142  lb 3.2 oz (64.5 kg)  Height: 5\' 8"  (1.727 m)   Body mass index is 21.62 kg/m.  Physical Exam  GENERAL APPEARANCE: Well nourished. In no acute distress. Normal body habitus SKIN:  Skin is warm and dry.  MOUTH and THROAT: Lips are without lesions. Oral mucosa is moist and without lesions. Tongue is normal in shape, size, and color and without lesions RESPIRATORY: Breathing is even & unlabored, BS CTAB CARDIAC: Irregular heart rhythm, no murmur,no extra heart sounds, no edema GI: Abdomen soft, normal BS, no masses, no tenderness NEUROLOGICAL: + tremor. Speech is clear. Alert and oriented X 3. PSYCHIATRIC:  Affect and behavior are appropriate  Labs reviewed: Recent Labs    11/23/18 0000 01/05/19 0000  NA 137 135*  K 4.7 4.6  CL 99 96*  CO2 28* 23*  BUN 14 12  CREATININE 1.0 0.9  CALCIUM 8.7 9.2    Recent Labs    08/17/18 0000 11/23/18 0000 01/09/19 0000  WBC 6.0 4.4 5.0  HGB 10.5* 10.3* 9.7*  HCT 31* 30* 29*  PLT 270 225 236   Lab Results  Component Value Date   TSH 2.82 02/20/2019   Lab Results  Component Value Date   HGBA1C 5.2 12/01/2015   Lab Results  Component Value Date   CHOL 107 12/01/2015   HDL 27 (L) 12/01/2015   LDLCALC 59 12/01/2015   TRIG 107 12/01/2015   CHOLHDL 4.0 12/01/2015    Assessment/Plan  1. Paroxysmal SVT (supraventricular tachycardia) (HCC) -Continue digoxin 125 mcg 1 tab daily - flecainide (TAMBOCOR) 50 MG tablet; Take 50 mg by mouth every 12 (twelve) hours.  2. Hypothyroidism due to medication Lab Results  Component Value Date   TSH 2.82 02/20/2019   -Continue levothyroxine 50 mcg 1 tab daily  3. Chronic obstructive pulmonary disease, unspecified COPD type (HCC) -No SOB, continue PRN levalbuterol, Mucinex, prednisone and Incruse  4. Essential tremor - improved, continue Inderal and primidone -Follows up with neurology  5. Allergic rhinitis, unspecified seasonality, unspecified trigger - will change Flonase nasal  spray to as needed due to non use  6. Chronic diastolic CHF (congestive heart failure) (HCC) - stable, continue Lasix 20 mg 1 tab daily  7. Somnolence - will taper off Neurontin, fall precautions  8.  Chronic pain syndrome - stable, continue PRN Norco and acetaminophen 325 mg 2 tabs = 650 mg twice a day  Family/ staff Communication: Discussed plan of care with resident and charge nurse.  Labs/tests ordered: None  Goals of care:   Long-term care   Durenda Age, DNP, FNP-BC [facility] [phone] (Monday-Friday 8:00 a.m. - 5:00 p.m.) [phone] (after hours)

## 2019-04-24 DIAGNOSIS — F329 Major depressive disorder, single episode, unspecified: Secondary | ICD-10-CM | POA: Diagnosis not present

## 2019-04-24 DIAGNOSIS — F419 Anxiety disorder, unspecified: Secondary | ICD-10-CM | POA: Diagnosis not present

## 2019-04-24 DIAGNOSIS — G4700 Insomnia, unspecified: Secondary | ICD-10-CM | POA: Diagnosis not present

## 2019-05-01 DIAGNOSIS — G4700 Insomnia, unspecified: Secondary | ICD-10-CM | POA: Diagnosis not present

## 2019-05-01 DIAGNOSIS — F329 Major depressive disorder, single episode, unspecified: Secondary | ICD-10-CM | POA: Diagnosis not present

## 2019-05-01 DIAGNOSIS — F419 Anxiety disorder, unspecified: Secondary | ICD-10-CM | POA: Diagnosis not present

## 2019-05-06 DIAGNOSIS — G629 Polyneuropathy, unspecified: Secondary | ICD-10-CM | POA: Diagnosis not present

## 2019-05-06 DIAGNOSIS — M25511 Pain in right shoulder: Secondary | ICD-10-CM | POA: Diagnosis not present

## 2019-05-06 DIAGNOSIS — R293 Abnormal posture: Secondary | ICD-10-CM | POA: Diagnosis not present

## 2019-05-06 DIAGNOSIS — I5032 Chronic diastolic (congestive) heart failure: Secondary | ICD-10-CM | POA: Diagnosis not present

## 2019-05-06 DIAGNOSIS — M6281 Muscle weakness (generalized): Secondary | ICD-10-CM | POA: Diagnosis not present

## 2019-05-07 DIAGNOSIS — R293 Abnormal posture: Secondary | ICD-10-CM | POA: Diagnosis not present

## 2019-05-07 DIAGNOSIS — M25511 Pain in right shoulder: Secondary | ICD-10-CM | POA: Diagnosis not present

## 2019-05-07 DIAGNOSIS — I5032 Chronic diastolic (congestive) heart failure: Secondary | ICD-10-CM | POA: Diagnosis not present

## 2019-05-07 DIAGNOSIS — M6281 Muscle weakness (generalized): Secondary | ICD-10-CM | POA: Diagnosis not present

## 2019-05-07 DIAGNOSIS — G629 Polyneuropathy, unspecified: Secondary | ICD-10-CM | POA: Diagnosis not present

## 2019-05-08 DIAGNOSIS — F419 Anxiety disorder, unspecified: Secondary | ICD-10-CM | POA: Diagnosis not present

## 2019-05-08 DIAGNOSIS — G629 Polyneuropathy, unspecified: Secondary | ICD-10-CM | POA: Diagnosis not present

## 2019-05-08 DIAGNOSIS — F329 Major depressive disorder, single episode, unspecified: Secondary | ICD-10-CM | POA: Diagnosis not present

## 2019-05-08 DIAGNOSIS — G4700 Insomnia, unspecified: Secondary | ICD-10-CM | POA: Diagnosis not present

## 2019-05-08 DIAGNOSIS — R293 Abnormal posture: Secondary | ICD-10-CM | POA: Diagnosis not present

## 2019-05-08 DIAGNOSIS — M6281 Muscle weakness (generalized): Secondary | ICD-10-CM | POA: Diagnosis not present

## 2019-05-08 DIAGNOSIS — I5032 Chronic diastolic (congestive) heart failure: Secondary | ICD-10-CM | POA: Diagnosis not present

## 2019-05-08 DIAGNOSIS — M25511 Pain in right shoulder: Secondary | ICD-10-CM | POA: Diagnosis not present

## 2019-05-09 DIAGNOSIS — M6281 Muscle weakness (generalized): Secondary | ICD-10-CM | POA: Diagnosis not present

## 2019-05-09 DIAGNOSIS — I5032 Chronic diastolic (congestive) heart failure: Secondary | ICD-10-CM | POA: Diagnosis not present

## 2019-05-09 DIAGNOSIS — R293 Abnormal posture: Secondary | ICD-10-CM | POA: Diagnosis not present

## 2019-05-09 DIAGNOSIS — M25511 Pain in right shoulder: Secondary | ICD-10-CM | POA: Diagnosis not present

## 2019-05-09 DIAGNOSIS — G629 Polyneuropathy, unspecified: Secondary | ICD-10-CM | POA: Diagnosis not present

## 2019-05-10 DIAGNOSIS — R293 Abnormal posture: Secondary | ICD-10-CM | POA: Diagnosis not present

## 2019-05-10 DIAGNOSIS — G629 Polyneuropathy, unspecified: Secondary | ICD-10-CM | POA: Diagnosis not present

## 2019-05-10 DIAGNOSIS — I5032 Chronic diastolic (congestive) heart failure: Secondary | ICD-10-CM | POA: Diagnosis not present

## 2019-05-10 DIAGNOSIS — M6281 Muscle weakness (generalized): Secondary | ICD-10-CM | POA: Diagnosis not present

## 2019-05-10 DIAGNOSIS — M25511 Pain in right shoulder: Secondary | ICD-10-CM | POA: Diagnosis not present

## 2019-05-13 DIAGNOSIS — M25511 Pain in right shoulder: Secondary | ICD-10-CM | POA: Diagnosis not present

## 2019-05-13 DIAGNOSIS — G629 Polyneuropathy, unspecified: Secondary | ICD-10-CM | POA: Diagnosis not present

## 2019-05-13 DIAGNOSIS — M6281 Muscle weakness (generalized): Secondary | ICD-10-CM | POA: Diagnosis not present

## 2019-05-13 NOTE — Progress Notes (Signed)
PATIENT: Dawn [name] DOB: 1940/07/15  REASON FOR VISIT: follow up HISTORY FROM: patient  HISTORY OF PRESENT ILLNESS: Today 05/14/19  HISTORY  Dawn [name] is a 79 year old female, seen in request by her primary care physician Dr. Unice Cobble for evaluation of tremor, headaches, initial evaluation was on March 15, 2019.  She is currently a resident of [facility], was brought in by transportation, is alone at today's clinical visit.  I have reviewed and summarized the referring note from the referring physician.  She has past medical history of hypothyroidism, on supplement, depression, anxiety, history of right hip fracture, status post arthroplasty bipolar hip in January 2018, severe right hip pain, wheelchair-bound, SVT, on digoxin, rate controlled with flecainide  She was a patient of [facility] neurologist Dr. Carles Collet since Jan 2020, for evaluation of tremor, patient reported bilateral upper extremity tremor since 2018, also had occasionally head titubation, she has significant gait difficulty, which she contributed to her right hip pain, and surgery, there was no family history of tremor, she was diagnosed with essential tremor, was started on primidone, currently she is only taking 50 mg 2 tablets at nighttime, reviewing Dr. Doristine Devoid record, there was attempt to increase the dosage to higher, but she was not sure why she is on current dose, in addition, she is also on artane 2 mg every night, despite the treatment, she continues to have bilateral hands tremor, difficulty holding utensils, often shake liquid out of the cup.  She denies a previous history of headache, complains frequent headaches since 2020, she complains of 8 out of 10 holoacranial headache, has been ongoing for few days, failed Tylenol treatment, she also complains of bilateral neck pain, neck muscle stiffness, shoulder muscle pain,  I personally reviewed MRI of lumbar spine in November 2019: Lumbar scoliosis  with multilevel degenerative changes, with variable degree of foraminal stenosis, most severe at L4-5, severe left subarticular foraminal stenosis, moderate subarticular stenosis bilaterally right greater than left, no evidence of spinal cord compression.  Update May 14, 2019 SS: Laboratory evaluation showed mild elevated CRP 15, normal ESR, MRI of the brain was ordered, but has yet to be completed.  For tremor, Artane was stopped, Inderal LA 60 mg daily was added. She lives at [facility]. Tremor in both hands equally, and head, hard time eating, is right handed. Hasn't had as many headache since trigger point injections, now occasional pain to left side behind ear, around neck, sharp pain comes and goes, isn't that frequent, a few times a week. She feels tremor remained the same, no headache lately. Wears oxygen all the time. Chronic pain to right hip, leg, uses walker, in wheelchair today, unaccompanied. She has benefited from Inderal LA.   REVIEW OF SYSTEMS: Out of a complete 14 system review of symptoms, the patient complains only of the following symptoms, and all other reviewed systems are negative.  Headache, tremor   ALLERGIES: Allergies  Allergen Reactions  . Biofreeze [Menthol (Topical Analgesic)] Rash    By history Aspercreme does not cause a rash    HOME MEDICATIONS: Outpatient Medications Prior to Visit  Medication Sig Dispense Refill  . acetaminophen (TYLENOL) 325 MG tablet Take 650 mg by mouth See admin instructions. q12h scheduled and q12h prn    . alum & mag hydroxide-simeth (MAALOX PLUS) 400-400-40 MG/5ML suspension Give 30cc PO q2H PRN x 24H (call MD if indigestion is not relieved w/i 24H)    . aspirin 81 MG chewable tablet Chew  81 mg by mouth daily.     . digoxin (LANOXIN) 0.125 MG tablet Take 0.125 mg by mouth daily. Hold if HR <60    . DULoxetine (CYMBALTA) 60 MG capsule Take 120 mg by mouth daily. For Depression    . flecainide (TAMBOCOR) 50 MG tablet  Take 50 mg by mouth every 12 (twelve) hours.    . fluticasone (FLONASE ALLERGY RELIEF) 50 MCG/ACT nasal spray Place 1 spray into both nostrils daily.    . furosemide (LASIX) 20 MG tablet Take 20 mg by mouth daily. For CHF    . guaiFENesin (MUCINEX) 600 MG 12 hr tablet Take 600 mg by mouth 2 (two) times daily. COPD cough    . HYDROcodone-acetaminophen (NORCO/VICODIN) 5-325 MG tablet Give **1/2 tablet by mouth q 6 hrs PRN 30 tablet 0  . levalbuterol (XOPENEX) 1.25 MG/3ML nebulizer solution Take 1.25 mg by nebulization every 6 (six) hours as needed for wheezing.    Marland Kitchen levothyroxine (SYNTHROID, LEVOTHROID) 25 MCG tablet Take 50 mcg by mouth daily before breakfast. FOR HYPOTHYROIDISM    . loratadine (CLARITIN) 10 MG tablet Take 10 mg by mouth at bedtime.    . Multiple Vitamins-Minerals (MULTIVITAMIN WITH MINERALS) tablet Take 1 tablet by mouth daily.    . naphazoline-glycerin (CLEAR EYES REDNESS RELIEF) 0.012-0.2 % SOLN Place 1 drop into both eyes 2 (two) times daily.    . Nutritional Supplements (ENSURE PO) Take by mouth. ENSURE LIQUID Give 237 ml po daily    . ondansetron (ZOFRAN) 4 MG tablet Take 4 mg by mouth 2 (two) times daily as needed for nausea.     . OXYGEN Inhale 2 L into the lungs at bedtime.    . pantoprazole (PROTONIX) 40 MG tablet Take 40 mg by mouth daily.    . polyethylene glycol (MIRALAX / GLYCOLAX) packet Take 17 g by mouth daily.    . polyvinyl alcohol (ARTIFICIAL TEARS) 1.4 % ophthalmic solution Place 2 drops into both eyes 2 (two) times daily. For dry eyes    . predniSONE (DELTASONE) 5 MG tablet TAKE 1 TABLET BY MOUTH DAILY (WITH BREAKFAST) DU:KGUR    . primidone (MYSOLINE) 50 MG tablet Take 100 mg by mouth at bedtime.     . propranolol ER (INDERAL LA) 60 MG 24 hr capsule Take 1 capsule (60 mg total) by mouth daily. 30 capsule 11  . sennosides-docusate sodium (SENOKOT-S) 8.6-50 MG tablet Take 1 tablet by mouth 2 (two) times daily.     . traZODone (DESYREL) 50 MG tablet Take 1/2  tab =25 mg by mouth at bedtime 30 tablet 3  . traZODone (DESYREL) 50 MG tablet TAKE 1/4 TABLET (12.5MG) BY MOUTH TWICE A DAY DX: ANXIETY    . umeclidinium bromide (INCRUSE ELLIPTA) 62.5 MCG/INH AEPB Inhale 1 puff into the lungs daily. (SHAKE WELL)     No facility-administered medications prior to visit.    PAST MEDICAL HISTORY: Past Medical History:  Diagnosis Date  . Anemia   . Chronic diastolic heart failure ([place])   . Chronic kidney disease, stage 3   . Chronic pain syndrome   . Constipation   . COPD (chronic obstructive pulmonary disease) ([place])   . Dysphagia   . Frequent falls   . Generalized muscle weakness   . GERD (gastroesophageal reflux disease)   . Headache   . Hip pain, right   . History of anxiety   . History of depression   . Hyperglycemia   . Hypothyroid   . Insomnia   .  Metabolic encephalopathy   . Palpitation   . Polyneuropathy   . Spinal stenosis   . Spinal stenosis, cervical region   . Supraventricular tachycardia ([place])   . Thyrotoxicosis, unspecified with thyrotoxic crisis or storm   . Tremor   . Unsteadiness on feet   . UTI (urinary tract infection)     PAST SURGICAL HISTORY: Past Surgical History:  Procedure Laterality Date  . ANKLE FRACTURE SURGERY Left   . CATARACT EXTRACTION, BILATERAL    . FLEXIBLE SIGMOIDOSCOPY Left 11/18/2015   Procedure: FLEXIBLE SIGMOIDOSCOPY;  Surgeon: [name], MD;  Location: [facility];  Service: Endoscopy;  Laterality: Left;  . FLEXIBLE SIGMOIDOSCOPY N/A 11/20/2015   Procedure: FLEXIBLE SIGMOIDOSCOPY;  Surgeon: [name], MD;  Location: [facility] ENDOSCOPY;  Service: Endoscopy;  Laterality: N/A;  . HIP ARTHROPLASTY Right 01/12/2016   Procedure: ARTHROPLASTY BIPOLAR HIP (HEMIARTHROPLASTY);  Surgeon: [name], MD;  Location: [facility];  Service: Orthopedics;  Laterality: Right;    FAMILY HISTORY: Family History  Problem Relation Age of Onset  . Heart attack Mother   . Diabetes Father   . Breast cancer Sister   .  Thyroid disease Neg Hx     SOCIAL HISTORY: Social History   Socioeconomic History  . Marital status: Divorced    Spouse name: Not on file  . Number of children: 1  . Years of education: 11th grade  . Highest education level: Not on file  Occupational History  . 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 Smoking status: Former Smoker    Quit date: 12/11/2015    Years since quitting: 3.4  . Smokeless tobacco: Never Used  . Tobacco comment: Smoked age [age] up to < 1 ppd, usually half a pack per day, mainly one half pack per day  Substance and Sexual Activity  . Alcohol use: No  . Drug use: No  . Sexual activity: Not Currently  Other Topics Concern  . Not on file  Social History Narrative   She is a long-term care resident of [facility].   Right handed.   Occasional caffeine.         Social Determinants of Health   Financial Resource Strain:   . Difficulty of Paying Living Expenses:   Food Insecurity:   . Worried About Charity fundraiser in the Last Year:   . Arboriculturist in the Last Year:   Transportation Needs:   . Film/video editor (Medical):   Marland Kitchen Lack of Transportation (Non-Medical):   Physical Activity:   . Days of Exercise per Week:   . Minutes of Exercise per Session:   Stress:   . Feeling of Stress :   Social Connections:   . Frequency of Communication with Friends and Family:   . Frequency of Social Gatherings with Friends and Family:   . Attends Religious Services:   . Active Member of Clubs or Organizations:   . Attends Archivist Meetings:   Marland Kitchen Marital Status:   Intimate Partner Violence:   . Fear of Current or Ex-Partner:   . Emotionally Abused:   Marland Kitchen Physically Abused:   . Sexually Abused:    PHYSICAL EXAM  Vitals:   05/14/19 0940  BP: 118/70  Pulse: 71  Temp: (!) 97.5 F (36.4 C)  Height: '5\' 8"'  (1.727 m)   Body mass index is 21.62  kg/m.  Generalized: Well developed, in no  acute distress  Neurological examination  Mentation: Alert oriented to time, place, history taking. Follows all commands speech and language fluent, wearing [place] oxygen Cranial nerve II-XII: Pupils were equal round reactive to light. Extraocular movements were full, visual field were full on confrontational test. Facial sensation and strength were normal.  Head turning and shoulder shrug were normal and symmetric.  Motor: Good strength of all extremities, postural tremor bilateral hands, occasional head tremor, pain with right lower extremity hip flexion, no distal weakness Sensory: Sensory testing is intact to soft touch on all 4 extremities. No evidence of extinction is noted.  Coordination: Cerebellar testing reveals good finger-nose-finger bilaterally Gait and station: In wheelchair, able to stand with pushoff, was not ambulated didn't have walker Reflexes: Deep tendon reflexes are symmetric but depressed bilaterally  DIAGNOSTIC DATA (LABS, IMAGING, TESTING) - I reviewed patient records, labs, notes, testing and imaging myself where available.  Lab Results  Component Value Date   WBC 5.0 01/09/2019   HGB 9.7 (A) 01/09/2019   HCT 29 (A) 01/09/2019   MCV 99.7 10/07/2016   PLT 236 01/09/2019      Component Value Date/Time   NA 135 (A) 01/05/2019 0000   K 4.6 01/05/2019 0000   CL 96 (A) 01/05/2019 0000   CL 102 03/02/2018 0000   CO2 23 (A) 01/05/2019 0000   CO2 29 03/02/2018 0000   GLUCOSE 96 10/07/2016 0347   BUN 12 01/05/2019 0000   CREATININE 0.9 01/05/2019 0000   CREATININE 1.21 (H) 10/07/2016 0347   CALCIUM 9.2 01/05/2019 0000   CALCIUM 8.2 03/02/2018 0000   PROT 6.0 (L) 10/07/2016 0347   ALBUMIN 2.8 (L) 10/07/2016 0347   AST 12 (A) 12/05/2017 0000   ALT 8 12/05/2017 0000   ALKPHOS 97 12/05/2017 0000   BILITOT 0.5 10/07/2016 0347   GFRNONAA 60.80 01/05/2019 0000   GFRAA 70.46 01/05/2019 0000   Lab Results  Component Value  Date   CHOL 107 12/01/2015   HDL 27 (L) 12/01/2015   LDLCALC 59 12/01/2015   TRIG 107 12/01/2015   CHOLHDL 4.0 12/01/2015   Lab Results  Component Value Date   HGBA1C 5.2 12/01/2015   Lab Results  Component Value Date   VITAMINB12 536 07/17/2018   Lab Results  Component Value Date   TSH 2.82 02/20/2019    ASSESSMENT AND PLAN 79 y.o. year old female  has a past medical history of Anemia, Chronic diastolic heart failure ([place]), Chronic kidney disease, stage 3, Chronic pain syndrome, Constipation, COPD (chronic obstructive pulmonary disease) ([place]), Dysphagia, Frequent falls, Generalized muscle weakness, GERD (gastroesophageal reflux disease), Headache, Hip pain, right, History of anxiety, History of depression, Hyperglycemia, Hypothyroid, Insomnia, Metabolic encephalopathy, Palpitation, Polyneuropathy, Spinal stenosis, Spinal stenosis, cervical region, Supraventricular tachycardia ([place]), Thyrotoxicosis, unspecified with thyrotoxic crisis or storm, Tremor, Unsteadiness on feet, and UTI (urinary tract infection). here with:  1.  New onset persistent headache in elderly -MRI of the brain has yet to be completed, will try to get this set up -ESR was normal, CRP mildly elevated 15 -Overall, reports no longer significant headache, occasional, sharp pain to left occipital area, headache much improved after trigger point/nerve block at last visit -Taking Inderal LA,  Cymbalta that may benefit headache, if medications are needed in future, possibly Topamax low dose could be helpful for headache and tremor -Follow-up in 3 months or sooner if needed  2.  Essential tremor -Overall, stable, some improvement with Inderal LA -When last seen, Artane was stopped -Continue Inderal  LA 60 mg daily, will stay at current dose due to COPD -Continue primidone 50 mg, 2 tablets at bedtime  I spent 20 minutes of face-to-face and non-face-to-face time with patient.  This included previsit chart review, lab  review, study review, order entry, electronic health record documentation, patient education.   Butler Denmark, AGNP-C, DNP 05/14/2019, 10:22 AM [facility] [address], [place], [place] [address] [phone]

## 2019-05-14 ENCOUNTER — Ambulatory Visit (INDEPENDENT_AMBULATORY_CARE_PROVIDER_SITE_OTHER): Payer: Medicare Other | Admitting: Neurology

## 2019-05-14 ENCOUNTER — Encounter: Payer: Self-pay | Admitting: Neurology

## 2019-05-14 ENCOUNTER — Telehealth: Payer: Self-pay | Admitting: Neurology

## 2019-05-14 ENCOUNTER — Other Ambulatory Visit: Payer: Self-pay

## 2019-05-14 VITALS — BP 118/70 | HR 71 | Temp 97.5°F | Ht 68.0 in

## 2019-05-14 DIAGNOSIS — R0602 Shortness of breath: Secondary | ICD-10-CM | POA: Diagnosis not present

## 2019-05-14 DIAGNOSIS — G250 Essential tremor: Secondary | ICD-10-CM | POA: Diagnosis not present

## 2019-05-14 DIAGNOSIS — M25511 Pain in right shoulder: Secondary | ICD-10-CM | POA: Diagnosis not present

## 2019-05-14 DIAGNOSIS — R519 Headache, unspecified: Secondary | ICD-10-CM

## 2019-05-14 DIAGNOSIS — G629 Polyneuropathy, unspecified: Secondary | ICD-10-CM | POA: Diagnosis not present

## 2019-05-14 DIAGNOSIS — M6281 Muscle weakness (generalized): Secondary | ICD-10-CM | POA: Diagnosis not present

## 2019-05-14 NOTE — Patient Instructions (Signed)
Continue current medications Will get you set up for MRI of the brain  See you back in 3 months or sooner if needed

## 2019-05-14 NOTE — Telephone Encounter (Signed)
Can you help get this patient setup for MRI of the brain ordered by Dr. Krista Blue at her last visit.

## 2019-05-14 NOTE — Telephone Encounter (Signed)
Noted, medicare no auth. Order faxed to [facility] they will reach out to the patient to schedule.

## 2019-05-15 DIAGNOSIS — F329 Major depressive disorder, single episode, unspecified: Secondary | ICD-10-CM | POA: Diagnosis not present

## 2019-05-15 DIAGNOSIS — G629 Polyneuropathy, unspecified: Secondary | ICD-10-CM | POA: Diagnosis not present

## 2019-05-15 DIAGNOSIS — F419 Anxiety disorder, unspecified: Secondary | ICD-10-CM | POA: Diagnosis not present

## 2019-05-15 DIAGNOSIS — M25511 Pain in right shoulder: Secondary | ICD-10-CM | POA: Diagnosis not present

## 2019-05-15 DIAGNOSIS — G4700 Insomnia, unspecified: Secondary | ICD-10-CM | POA: Diagnosis not present

## 2019-05-15 DIAGNOSIS — M6281 Muscle weakness (generalized): Secondary | ICD-10-CM | POA: Diagnosis not present

## 2019-05-16 ENCOUNTER — Other Ambulatory Visit: Payer: Self-pay | Admitting: Adult Health

## 2019-05-16 DIAGNOSIS — G894 Chronic pain syndrome: Secondary | ICD-10-CM

## 2019-05-16 DIAGNOSIS — G629 Polyneuropathy, unspecified: Secondary | ICD-10-CM | POA: Diagnosis not present

## 2019-05-16 DIAGNOSIS — M6281 Muscle weakness (generalized): Secondary | ICD-10-CM | POA: Diagnosis not present

## 2019-05-16 DIAGNOSIS — M25511 Pain in right shoulder: Secondary | ICD-10-CM | POA: Diagnosis not present

## 2019-05-16 MED ORDER — HYDROCODONE-ACETAMINOPHEN 5-325 MG PO TABS: ORAL_TABLET | ORAL | 0 refills | Status: DC

## 2019-05-17 ENCOUNTER — Encounter: Payer: Self-pay | Admitting: Adult Health

## 2019-05-17 ENCOUNTER — Non-Acute Institutional Stay (SKILLED_NURSING_FACILITY): Payer: Medicare Other | Admitting: Adult Health

## 2019-05-17 DIAGNOSIS — G250 Essential tremor: Secondary | ICD-10-CM | POA: Diagnosis not present

## 2019-05-17 DIAGNOSIS — I5032 Chronic diastolic (congestive) heart failure: Secondary | ICD-10-CM

## 2019-05-17 DIAGNOSIS — J449 Chronic obstructive pulmonary disease, unspecified: Secondary | ICD-10-CM | POA: Diagnosis not present

## 2019-05-17 DIAGNOSIS — E034 Atrophy of thyroid (acquired): Secondary | ICD-10-CM | POA: Diagnosis not present

## 2019-05-17 DIAGNOSIS — M25511 Pain in right shoulder: Secondary | ICD-10-CM | POA: Diagnosis not present

## 2019-05-17 DIAGNOSIS — M6281 Muscle weakness (generalized): Secondary | ICD-10-CM | POA: Diagnosis not present

## 2019-05-17 DIAGNOSIS — G629 Polyneuropathy, unspecified: Secondary | ICD-10-CM | POA: Diagnosis not present

## 2019-05-17 DIAGNOSIS — I471 Supraventricular tachycardia: Secondary | ICD-10-CM | POA: Diagnosis not present

## 2019-05-17 NOTE — Progress Notes (Signed)
Location:  [place] Room Number: 102-V Place of Service:  SNF (31) Provider:  Durenda Age, DNP, FNP-BC  Patient Care Team: [name], MD as PCP - General (Internal Medicine) Medina-Vargas, [name], NP as Nurse Practitioner (Internal Medicine)  Extended Emergency Contact Information Primary Emergency Contact: Hatfield,Lisa Address: [address]          [place], [place] [address] [place] Work Phone: [phone] Mobile Phone: [phone] Relation: Daughter  Code Status:  Full Code  Goals of care: Advanced Directive information Advanced Directives 04/16/2019  Does Patient Have a Medical Advance Directive? Yes  Type of Advance Directive (No Data)  Does patient want to make changes to medical advance directive? No - Patient declined  Would patient like information on creating a medical advance directive? -     Chief Complaint  Patient presents with  . Medical Management of Chronic Issues    Routine [facility] visit    HPI:  Pt is a 79 y.o. female seen today for medical management of chronic diseases.  She is a long-term care resident of [facility]. She has a PMH of COPD, spinal stenosis, depression and anxiety. She participates in restorative walking whrerein she ambulates 50 ft with rolling walker. She recently followed up with neurology for her tremors. She continues to take Primidone and Inderal for her tremors.    Past Medical History:  Diagnosis Date  . Anemia   . Chronic diastolic heart failure ([place])   . Chronic kidney disease, stage 3   . Chronic pain syndrome   . Constipation   . COPD (chronic obstructive pulmonary disease) ([place])   . Dysphagia   . Frequent falls   . Generalized muscle weakness   . GERD (gastroesophageal reflux disease)   . Headache   . Hip pain, right   . History of anxiety   . History of depression   . Hyperglycemia   . Hypothyroid   . Insomnia   .  Metabolic encephalopathy   . Palpitation   . Polyneuropathy   . Spinal stenosis   . Spinal stenosis, cervical region   . Supraventricular tachycardia ([place])   . Thyrotoxicosis, unspecified with thyrotoxic crisis or storm   . Tremor   . Unsteadiness on feet   . UTI (urinary tract infection)    Past Surgical History:  Procedure Laterality Date  . ANKLE FRACTURE SURGERY Left   . CATARACT EXTRACTION, BILATERAL    . FLEXIBLE SIGMOIDOSCOPY Left 11/18/2015   Procedure: FLEXIBLE SIGMOIDOSCOPY;  Surgeon: [name], MD;  Location: [place];  Service: Endoscopy;  Laterality: Left;  . FLEXIBLE SIGMOIDOSCOPY N/A 11/20/2015   Procedure: FLEXIBLE SIGMOIDOSCOPY;  Surgeon: [name], MD;  Location[facility] ENDOSCOPY;  Service: Endoscopy;  Laterality: N/A;  . HIP ARTHROPLASTY Right 01/12/2016   Procedure: ARTHROPLASTY BIPOLAR HIP (HEMIARTHROPLASTY);  Surgeon: [name], MD;  Location: [facility];  Service: Orthopedics;  Laterality: Right;    Allergies  Allergen Reactions  . Biofreeze [Menthol (Topical Analgesic)] Rash    By history Aspercreme does not cause a rash    Outpatient Encounter Medications as of 05/17/2019  Medication Sig  . acetaminophen (TYLENOL) 325 MG tablet Take 650 mg by mouth See admin instructions. q12h scheduled and q12h prn  . albuterol (ACCUNEB) 1.25 MG/3ML nebulizer solution Take 1 ampule by nebulization every 6 (six) hours as needed for wheezing.  Marland Kitchen alum & mag hydroxide-simeth (MAALOX PLUS) 400-400-40 MG/5ML suspension Give 30cc PO q2H PRN x  24H (call MD if indigestion is not relieved w/i 24H)  . aspirin 81 MG chewable tablet Chew 81 mg by mouth daily.   . calcium carbonate (TUMS - DOSED IN MG ELEMENTAL CALCIUM) 500 MG chewable tablet Chew 2 tablets by mouth every 8 (eight) hours as needed for indigestion or heartburn.  . digoxin (LANOXIN) 0.125 MG tablet Take 0.125 mg by mouth daily. Hold if HR <60  . DULoxetine (CYMBALTA) 60 MG capsule Take 120 mg by mouth daily. For Depression    . flecainide (TAMBOCOR) 50 MG tablet Take 50 mg by mouth every 12 (twelve) hours.  . fluticasone (FLONASE ALLERGY RELIEF) 50 MCG/ACT nasal spray Place 1 spray into both nostrils daily.  . furosemide (LASIX) 20 MG tablet Take 20 mg by mouth daily. For CHF  . guaiFENesin (MUCINEX) 600 MG 12 hr tablet Take 600 mg by mouth 2 (two) times daily. COPD cough  . HYDROcodone-acetaminophen (NORCO/VICODIN) 5-325 MG tablet Give **1/2 tablet by mouth q 6 hrs PRN  . levothyroxine (SYNTHROID) 50 MCG tablet Take 50 mcg by mouth daily before breakfast.  . loratadine (CLARITIN) 10 MG tablet Take 10 mg by mouth at bedtime.  . Multiple Vitamins-Minerals (MULTIVITAMIN WITH MINERALS) tablet Take 1 tablet by mouth daily.  . naphazoline-glycerin (CLEAR EYES REDNESS RELIEF) 0.012-0.2 % SOLN Place 1 drop into both eyes 2 (two) times daily.  . Nutritional Supplements (ENSURE PO) Take by mouth. ENSURE LIQUID Give 237 ml po daily  . ondansetron (ZOFRAN) 4 MG tablet Take 4 mg by mouth every 8 (eight) hours as needed for nausea.   . OXYGEN Inhale 2 L into the lungs at bedtime.  . pantoprazole (PROTONIX) 40 MG tablet Take 40 mg by mouth daily.  . polyethylene glycol (MIRALAX / GLYCOLAX) packet Take 17 g by mouth daily.  . polyvinyl alcohol (ARTIFICIAL TEARS) 1.4 % ophthalmic solution Place 2 drops into both eyes 2 (two) times daily. For dry eyes  . predniSONE (DELTASONE) 5 MG tablet TAKE 1 TABLET BY MOUTH DAILY (WITH BREAKFAST) [id number]  . primidone (MYSOLINE) 50 MG tablet Take 100 mg by mouth at bedtime.   . propranolol ER (INDERAL LA) 60 MG 24 hr capsule Take 1 capsule (60 mg total) by mouth daily.  . sennosides-docusate sodium (SENOKOT-S) 8.6-50 MG tablet Take 1 tablet by mouth 2 (two) times daily.   . traZODone (DESYREL) 50 MG tablet Take 1/2 tab =25 mg by mouth at bedtime  . traZODone (DESYREL) 50 MG tablet TAKE 1/4 TABLET (12.5MG ) BY MOUTH TWICE A DAY DX: ANXIETY  . umeclidinium bromide (INCRUSE ELLIPTA) 62.5 MCG/INH  AEPB Inhale 1 puff into the lungs daily. (SHAKE WELL)  . [DISCONTINUED] levalbuterol (XOPENEX) 1.25 MG/3ML nebulizer solution Take 1.25 mg by nebulization every 6 (six) hours as needed for wheezing.  . [DISCONTINUED] levothyroxine (SYNTHROID, LEVOTHROID) 25 MCG tablet Take 50 mcg by mouth daily before breakfast. FOR HYPOTHYROIDISM   No facility-administered encounter medications on file as of 05/17/2019.    Review of Systems  GENERAL: No fever, chills or weakness MOUTH and THROAT: Denies oral discomfort, gingival pain or bleeding RESPIRATORY: no cough, SOB, DOE, wheezing, hemoptysis CARDIAC: No chest pain, edema or palpitations GI: No abdominal pain, diarrhea, constipation, heart burn, nausea or vomiting GU: Denies dysuria, frequency, hematuria, incontinence, or discharge NEUROLOGICAL: Denies dizziness, syncope, numbness, or headache PSYCHIATRIC: Denies feelings of depression or anxiety. No report of hallucinations, insomnia, paranoia, or agitation   Immunization History  Administered Date(s) Administered  . Influenza, High Dose Seasonal PF  10/04/2016  . Influenza,inj,Quad PF,6+ Mos 01/13/2016  . Influenza-Unspecified 11/10/2017, 10/09/2018  . Moderna SARS-COVID-2 Vaccination 01/28/2019, 02/25/2019  . Pneumococcal-Unspecified 10/11/2011  . Tdap 01/23/2016   Pertinent  Health Maintenance Due  Topic Date Due  . INFLUENZA VACCINE  08/11/2019  . PNA vac Low Risk Adult  Completed  . DEXA SCAN  Discontinued   Fall Risk  10/24/2018 07/19/2018 01/17/2018 11/09/2017 07/14/2017  Falls in the past year? 0 0 0 No No  Number falls in past yr: 0 0 0 - -  Injury with Fall? 0 0 0 - -  Comment - - - - -  Risk Factor Category  - - - - -  Follow up - - Falls evaluation completed - -     Vitals:   05/17/19 1001  BP: 110/74  Pulse: 67  Resp: 18  Temp: (!) 96.8 F (36 C)  TempSrc: Oral  SpO2: 96%  Weight: 147 lb 12.8 oz (67 kg)  Height: 5\' 8"  (1.727 m)   Body mass index is 22.47  kg/m.  Physical Exam  GENERAL APPEARANCE: Well nourished. In no acute distress. Normal body habitus SKIN:  Skin is warm and dry.  MOUTH and THROAT: Lips are without lesions. Oral mucosa is moist and without lesions. Tongue is normal in shape, size, and color and without lesions RESPIRATORY: Breathing is even & unlabored, BS CTAB CARDIAC: RRR, no murmur,no extra heart sounds, no: GI: Abdomen soft, normal BS, no masses, no tenderness, EXTREMITIES:  Able to move X 4 extremities NEUROLOGICAL: + tremor. Speech is clear. Alert and oriented X 3. PSYCHIATRIC:  Affect and behavior are appropriate  Labs reviewed: Recent Labs    11/23/18 0000 01/05/19 0000  NA 137 135*  K 4.7 4.6  CL 99 96*  CO2 28* 23*  BUN 14 12  CREATININE 1.0 0.9  CALCIUM 8.7 9.2    Recent Labs    08/17/18 0000 11/23/18 0000 01/09/19 0000  WBC 6.0 4.4 5.0  HGB 10.5* 10.3* 9.7*  HCT 31* 30* 29*  PLT 270 225 236   Lab Results  Component Value Date   TSH 2.82 02/20/2019   Lab Results  Component Value Date   HGBA1C 5.2 12/01/2015   Lab Results  Component Value Date   CHOL 107 12/01/2015   HDL 27 (L) 12/01/2015   LDLCALC 59 12/01/2015   TRIG 107 12/01/2015   CHOLHDL 4.0 12/01/2015    Assessment/Plan  1. Hypothyroidism due to acquired atrophy of thyroid Lab Results  Component Value Date   TSH 2.82 02/20/2019   -Continue levothyroxine  2. Chronic obstructive pulmonary disease, unspecified COPD type ([place]) - no wheezing/SOB, continue PRN albuterol, Mucinex ER, prednisone and Incruse Ellipta  3. Paroxysmal SVT (supraventricular tachycardia) (HCC) -Rate controlled, continue digoxin and flecainide acetate  4. Essential tremor -Stable, continue Inderal and primidone -Recently followed up with neurology  5. Chronic diastolic CHF (congestive heart failure) (HCC) - no SOB, continue digoxin, Toprol-XL therapy and Lasix    Family/ staff Communication: Discussed plan of care with resident and  charge nurse.  Labs/tests ordered:  None  Goals of care:   Long-term care   Durenda Age, DNP, FNP-BC [facility] [phone] (Monday-Friday 8:00 a.m. - 5:00 p.m.) [phone] (after hours)

## 2019-05-20 DIAGNOSIS — M6281 Muscle weakness (generalized): Secondary | ICD-10-CM | POA: Diagnosis not present

## 2019-05-20 DIAGNOSIS — G629 Polyneuropathy, unspecified: Secondary | ICD-10-CM | POA: Diagnosis not present

## 2019-05-20 DIAGNOSIS — M25511 Pain in right shoulder: Secondary | ICD-10-CM | POA: Diagnosis not present

## 2019-05-21 DIAGNOSIS — G629 Polyneuropathy, unspecified: Secondary | ICD-10-CM | POA: Diagnosis not present

## 2019-05-21 DIAGNOSIS — M6281 Muscle weakness (generalized): Secondary | ICD-10-CM | POA: Diagnosis not present

## 2019-05-21 DIAGNOSIS — M25511 Pain in right shoulder: Secondary | ICD-10-CM | POA: Diagnosis not present

## 2019-05-21 NOTE — Progress Notes (Signed)
I have reviewed and agreed above plan. 

## 2019-05-22 DIAGNOSIS — H0102B Squamous blepharitis left eye, upper and lower eyelids: Secondary | ICD-10-CM | POA: Diagnosis not present

## 2019-05-22 DIAGNOSIS — G4700 Insomnia, unspecified: Secondary | ICD-10-CM | POA: Diagnosis not present

## 2019-05-22 DIAGNOSIS — F419 Anxiety disorder, unspecified: Secondary | ICD-10-CM | POA: Diagnosis not present

## 2019-05-22 DIAGNOSIS — H40013 Open angle with borderline findings, low risk, bilateral: Secondary | ICD-10-CM | POA: Diagnosis not present

## 2019-05-22 DIAGNOSIS — H0102A Squamous blepharitis right eye, upper and lower eyelids: Secondary | ICD-10-CM | POA: Diagnosis not present

## 2019-05-22 DIAGNOSIS — F329 Major depressive disorder, single episode, unspecified: Secondary | ICD-10-CM | POA: Diagnosis not present

## 2019-05-22 DIAGNOSIS — Z961 Presence of intraocular lens: Secondary | ICD-10-CM | POA: Diagnosis not present

## 2019-05-22 DIAGNOSIS — H43813 Vitreous degeneration, bilateral: Secondary | ICD-10-CM | POA: Diagnosis not present

## 2019-05-23 DIAGNOSIS — M25511 Pain in right shoulder: Secondary | ICD-10-CM | POA: Diagnosis not present

## 2019-05-23 DIAGNOSIS — M6281 Muscle weakness (generalized): Secondary | ICD-10-CM | POA: Diagnosis not present

## 2019-05-23 DIAGNOSIS — G629 Polyneuropathy, unspecified: Secondary | ICD-10-CM | POA: Diagnosis not present

## 2019-05-24 DIAGNOSIS — M25511 Pain in right shoulder: Secondary | ICD-10-CM | POA: Diagnosis not present

## 2019-05-24 DIAGNOSIS — M6281 Muscle weakness (generalized): Secondary | ICD-10-CM | POA: Diagnosis not present

## 2019-05-24 DIAGNOSIS — G629 Polyneuropathy, unspecified: Secondary | ICD-10-CM | POA: Diagnosis not present

## 2019-05-26 DIAGNOSIS — M461 Sacroiliitis, not elsewhere classified: Secondary | ICD-10-CM | POA: Diagnosis not present

## 2019-05-26 DIAGNOSIS — W19XXXA Unspecified fall, initial encounter: Secondary | ICD-10-CM | POA: Diagnosis not present

## 2019-05-26 DIAGNOSIS — M5416 Radiculopathy, lumbar region: Secondary | ICD-10-CM | POA: Diagnosis not present

## 2019-05-26 DIAGNOSIS — M542 Cervicalgia: Secondary | ICD-10-CM | POA: Diagnosis not present

## 2019-05-27 DIAGNOSIS — J449 Chronic obstructive pulmonary disease, unspecified: Secondary | ICD-10-CM | POA: Diagnosis not present

## 2019-05-27 DIAGNOSIS — G629 Polyneuropathy, unspecified: Secondary | ICD-10-CM | POA: Diagnosis not present

## 2019-05-27 DIAGNOSIS — E039 Hypothyroidism, unspecified: Secondary | ICD-10-CM | POA: Diagnosis not present

## 2019-05-27 DIAGNOSIS — M6281 Muscle weakness (generalized): Secondary | ICD-10-CM | POA: Diagnosis not present

## 2019-05-27 DIAGNOSIS — I5032 Chronic diastolic (congestive) heart failure: Secondary | ICD-10-CM | POA: Diagnosis not present

## 2019-05-27 DIAGNOSIS — M25511 Pain in right shoulder: Secondary | ICD-10-CM | POA: Diagnosis not present

## 2019-05-27 LAB — TSH: TSH: 0.09 — AB (ref 0.41–5.90)

## 2019-05-28 ENCOUNTER — Encounter: Payer: Self-pay | Admitting: Adult Health

## 2019-05-28 ENCOUNTER — Non-Acute Institutional Stay (SKILLED_NURSING_FACILITY): Payer: Medicare Other | Admitting: Adult Health

## 2019-05-28 DIAGNOSIS — I5032 Chronic diastolic (congestive) heart failure: Secondary | ICD-10-CM | POA: Diagnosis not present

## 2019-05-28 DIAGNOSIS — R7989 Other specified abnormal findings of blood chemistry: Secondary | ICD-10-CM

## 2019-05-28 DIAGNOSIS — M25511 Pain in right shoulder: Secondary | ICD-10-CM | POA: Diagnosis not present

## 2019-05-28 DIAGNOSIS — G629 Polyneuropathy, unspecified: Secondary | ICD-10-CM | POA: Diagnosis not present

## 2019-05-28 DIAGNOSIS — I471 Supraventricular tachycardia: Secondary | ICD-10-CM | POA: Diagnosis not present

## 2019-05-28 DIAGNOSIS — M6281 Muscle weakness (generalized): Secondary | ICD-10-CM | POA: Diagnosis not present

## 2019-05-28 NOTE — Progress Notes (Signed)
Location:  [place] Room Number: 546-E Place of Service:  SNF (31) Provider:  Durenda Age, DNP, FNP-BC  Patient Care Team: [name], MD as PCP - General (Internal Medicine) Medina-Vargas, [name], NP as Nurse Practitioner (Internal Medicine)  Extended Emergency Contact Information Primary Emergency Contact: Hatfield,Lisa Address: [address]          [place], [place] [address] [place] Work Phone: [phone] Mobile Phone: [phone] Relation: Daughter  Code Status:  FULL CODE  Goals of care: Advanced Directive information Advanced Directives 04/16/2019  Does Patient Have a Medical Advance Directive? Yes  Type of Advance Directive (No Data)  Does patient want to make changes to medical advance directive? No - Patient declined  Would patient like information on creating a medical advance directive? -     Chief Complaint  Patient presents with  . Acute Visit    Patient is seen for a low TSH (0.09)    HPI:  Pt is a 79 y.o. female seen today for low TSH 0.09 .She is a long-term care resident of [facility]. She has a PMH of COPD, spinal stenosis, depression and anxiety. She denies having diarrhea. She had a fall on 5/16. Fall was due to resident's attempt to grab Kleenex from the bedside table  and slid on the floor. X-ray of cervical, lumbar and sacral spine was negative for fracture.   Past Medical History:  Diagnosis Date  . Anemia   . Chronic diastolic heart failure ([place])   . Chronic kidney disease, stage 3   . Chronic pain syndrome   . Constipation   . COPD (chronic obstructive pulmonary disease) ([place])   . Dysphagia   . Frequent falls   . Generalized muscle weakness   . GERD (gastroesophageal reflux disease)   . Headache   . Hip pain, right   . History of anxiety   . History of depression   . Hyperglycemia   . Hypothyroid   . Insomnia   . Metabolic encephalopathy   .  Palpitation   . Polyneuropathy   . Spinal stenosis   . Spinal stenosis, cervical region   . Supraventricular tachycardia ([place])   . Thyrotoxicosis, unspecified with thyrotoxic crisis or storm   . Tremor   . Unsteadiness on feet   . UTI (urinary tract infection)    Past Surgical History:  Procedure Laterality Date  . ANKLE FRACTURE SURGERY Left   . CATARACT EXTRACTION, BILATERAL    . FLEXIBLE SIGMOIDOSCOPY Left 11/18/2015   Procedure: FLEXIBLE SIGMOIDOSCOPY;  Surgeon: [name], MD;  Location: [facility];  Service: Endoscopy;  Laterality: Left;  . FLEXIBLE SIGMOIDOSCOPY N/A 11/20/2015   Procedure: FLEXIBLE SIGMOIDOSCOPY;  Surgeon: [name], MD;  Location: [facility] ENDOSCOPY;  Service: Endoscopy;  Laterality: N/A;  . HIP ARTHROPLASTY Right 01/12/2016   Procedure: ARTHROPLASTY BIPOLAR HIP (HEMIARTHROPLASTY);  Surgeon: [name], MD;  Location: [facility];  Service: Orthopedics;  Laterality: Right;    Allergies  Allergen Reactions  . Biofreeze [Menthol (Topical Analgesic)] Rash    By history Aspercreme does not cause a rash    Outpatient Encounter Medications as of 05/28/2019  Medication Sig  . acetaminophen (TYLENOL) 325 MG tablet Take 650 mg by mouth See admin instructions. q12h scheduled and q12h prn  . albuterol (PROVENTIL) (2.5 MG/3ML) 0.083% nebulizer solution Take 2.5 mg by nebulization every 6 (six) hours as needed for wheezing or shortness of breath.  Marland Kitchen alum & mag hydroxide-simeth (MAALOX  PLUS) 400-400-40 MG/5ML suspension Give 30cc PO q2H PRN x 24H (call MD if indigestion is not relieved w/i 24H)  . aspirin 81 MG chewable tablet Chew 81 mg by mouth daily.   . calcium carbonate (TUMS - DOSED IN MG ELEMENTAL CALCIUM) 500 MG chewable tablet Chew 2 tablets by mouth every 8 (eight) hours as needed for indigestion or heartburn.  . digoxin (LANOXIN) 0.125 MG tablet Take 0.125 mg by mouth daily. Hold if HR <60  . DULoxetine (CYMBALTA) 60 MG capsule Take 120 mg by mouth daily. For  Depression  . Eyelid Cleansers (STERILID EX) Apply 1 each topically in the morning. Apply to closed eyelids and gently rub it in then wipe it.  . flecainide (TAMBOCOR) 50 MG tablet Take 50 mg by mouth every 12 (twelve) hours.  . fluticasone (FLONASE ALLERGY RELIEF) 50 MCG/ACT nasal spray Place 1 spray into both nostrils daily.  . furosemide (LASIX) 20 MG tablet Take 20 mg by mouth daily. For CHF  . guaiFENesin (MUCINEX) 600 MG 12 hr tablet Take 600 mg by mouth 2 (two) times daily. COPD cough  . HYDROcodone-acetaminophen (NORCO/VICODIN) 5-325 MG tablet Give **1/2 tablet by mouth q 6 hrs PRN  . levothyroxine (SYNTHROID) 50 MCG tablet Take 50 mcg by mouth daily before breakfast.  . loratadine (CLARITIN) 10 MG tablet Take 10 mg by mouth at bedtime.  . Multiple Vitamins-Minerals (MULTIVITAMIN WITH MINERALS) tablet Take 1 tablet by mouth daily.  . naphazoline-glycerin (CLEAR EYES REDNESS RELIEF) 0.012-0.2 % SOLN Place 1 drop into both eyes 2 (two) times daily.  . Nutritional Supplement LIQD Take 1 each by mouth 2 (two) times daily. Mighty Shake  . Nutritional Supplements (ENSURE PO) Take by mouth. ENSURE LIQUID Give 237 ml po daily  . ondansetron (ZOFRAN) 4 MG tablet Take 4 mg by mouth every 8 (eight) hours as needed for nausea.   . OXYGEN Inhale 2 L into the lungs at bedtime.  . pantoprazole (PROTONIX) 40 MG tablet Take 40 mg by mouth daily.  . polyethylene glycol (MIRALAX / GLYCOLAX) packet Take 17 g by mouth daily.  . polyvinyl alcohol (ARTIFICIAL TEARS) 1.4 % ophthalmic solution Place 2 drops into both eyes 2 (two) times daily. For dry eyes  . predniSONE (DELTASONE) 5 MG tablet TAKE 1 TABLET BY MOUTH DAILY (WITH BREAKFAST) [id number]  . primidone (MYSOLINE) 50 MG tablet Take 100 mg by mouth at bedtime.   . propranolol ER (INDERAL LA) 60 MG 24 hr capsule Take 1 capsule (60 mg total) by mouth daily.  . sennosides-docusate sodium (SENOKOT-S) 8.6-50 MG tablet Take 1 tablet by mouth 2 (two) times daily.     . traZODone (DESYREL) 50 MG tablet Take 1/2 tab =25 mg by mouth at bedtime  . traZODone (DESYREL) 50 MG tablet TAKE 1/4 TABLET (12.5MG ) BY MOUTH TWICE A DAY DX: ANXIETY  . umeclidinium bromide (INCRUSE ELLIPTA) 62.5 MCG/INH AEPB Inhale 1 puff into the lungs daily. (SHAKE WELL)  . [DISCONTINUED] albuterol (ACCUNEB) 1.25 MG/3ML nebulizer solution Take 1 ampule by nebulization every 6 (six) hours as needed for wheezing.   No facility-administered encounter medications on file as of 05/28/2019.    Review of Systems  GENERAL: No change in appetite, no fatigue, no weight changes, no fever, chills or weakness MOUTH and THROAT: Denies oral discomfort, gingival pain or bleeding RESPIRATORY: no cough, SOB, DOE, wheezing, hemoptysis CARDIAC: No chest pain, edema or palpitations GI: No abdominal pain, diarrhea, constipation, heart burn, nausea or vomiting GU: Denies dysuria, frequency,  hematuria, incontinence, or discharge NEUROLOGICAL: Denies dizziness, syncope, numbness, or headache PSYCHIATRIC: Denies feelings of depression or anxiety. No report of hallucinations, insomnia, paranoia, or agitation   Immunization History  Administered Date(s) Administered  . Influenza, High Dose Seasonal PF 10/04/2016  . Influenza,inj,Quad PF,6+ Mos 01/13/2016  . Influenza-Unspecified 11/10/2017, 10/09/2018  . Moderna SARS-COVID-2 Vaccination 01/28/2019, 02/25/2019  . Pneumococcal-Unspecified 10/11/2011  . Tdap 01/23/2016   Pertinent  Health Maintenance Due  Topic Date Due  . INFLUENZA VACCINE  08/11/2019  . PNA vac Low Risk Adult  Completed  . DEXA SCAN  Discontinued   Fall Risk  10/24/2018 07/19/2018 01/17/2018 11/09/2017 07/14/2017  Falls in the past year? 0 0 0 No No  Number falls in past yr: 0 0 0 - -  Injury with Fall? 0 0 0 - -  Comment - - - - -  Risk Factor Category  - - - - -  Follow up - - Falls evaluation completed - -     Vitals:   05/28/19 1243  BP: 100/61  Pulse: 87  Resp: 19  Temp:  (!) 97.2 F (36.2 C)  TempSrc: Oral  SpO2: 95%  Weight: 139 lb 12.8 oz (63.4 kg)  Height: 5\' 8"  (1.727 m)   Body mass index is 21.26 kg/m.  Physical Exam  GENERAL APPEARANCE: Well nourished. In no acute distress. Normal body habitus SKIN:  Skin is warm and dry.  MOUTH and THROAT: Lips are without lesions. Oral mucosa is moist and without lesions.  RESPIRATORY: Breathing is even & unlabored, BS CTAB CARDIAC: RRR, no murmur,no extra heart sounds, no edema GI: Abdomen soft, normal BS, no masses, no tenderness NEUROLOGICAL: + tremor. Speech is clear.Alert and oriented X 3.  PSYCHIATRIC:  Affect and behavior are appropriate  Labs reviewed: Recent Labs    11/23/18 0000 01/05/19 0000  NA 137 135*  K 4.7 4.6  CL 99 96*  CO2 28* 23*  BUN 14 12  CREATININE 1.0 0.9  CALCIUM 8.7 9.2    Recent Labs    08/17/18 0000 11/23/18 0000 01/09/19 0000  WBC 6.0 4.4 5.0  HGB 10.5* 10.3* 9.7*  HCT 31* 30* 29*  PLT 270 225 236   Lab Results  Component Value Date   TSH 0.09 (A) 05/27/2019   Lab Results  Component Value Date   HGBA1C 5.2 12/01/2015   Lab Results  Component Value Date   CHOL 107 12/01/2015   HDL 27 (L) 12/01/2015   LDLCALC 59 12/01/2015   TRIG 107 12/01/2015   CHOLHDL 4.0 12/01/2015    Assessment/Plan  1. Low TSH level Lab Results  Component Value Date   TSH 0.09 (A) 05/27/2019   - will decrease Levothyroxine from 50 mcg to 25 mcg daily - repeat tsh in 6 weeks  2. Paroxysmal SVT (supraventricular tachycardia) (HCC) - rate-controlled, continue flecainide 50 mg 1 tab every 12 hours, digoxin 125 mcg 1 tab daily  3. Chronic diastolic CHF (congestive heart failure) (HCC) -Stable, continue digoxin 125 mcg 1 tab daily and furosemide 20 mg 1 tab daily     Family/ staff Communication:  Discussed plan of care with resident and charge nurse.  Labs/tests ordered:  tsh in 6 weeks  Goals of care:  Long-term care   Durenda Age, DNP,  FNP-BC [facility] [phone] (Monday-Friday 8:00 a.m. - 5:00 p.m.) [phone] (after hours)

## 2019-05-29 ENCOUNTER — Other Ambulatory Visit: Payer: Self-pay | Admitting: Adult Health

## 2019-05-29 DIAGNOSIS — G4700 Insomnia, unspecified: Secondary | ICD-10-CM | POA: Diagnosis not present

## 2019-05-29 DIAGNOSIS — G629 Polyneuropathy, unspecified: Secondary | ICD-10-CM | POA: Diagnosis not present

## 2019-05-29 DIAGNOSIS — M6281 Muscle weakness (generalized): Secondary | ICD-10-CM | POA: Diagnosis not present

## 2019-05-29 DIAGNOSIS — G894 Chronic pain syndrome: Secondary | ICD-10-CM

## 2019-05-29 DIAGNOSIS — M25511 Pain in right shoulder: Secondary | ICD-10-CM | POA: Diagnosis not present

## 2019-05-29 DIAGNOSIS — F329 Major depressive disorder, single episode, unspecified: Secondary | ICD-10-CM | POA: Diagnosis not present

## 2019-05-29 DIAGNOSIS — F419 Anxiety disorder, unspecified: Secondary | ICD-10-CM | POA: Diagnosis not present

## 2019-05-29 MED ORDER — HYDROCODONE-ACETAMINOPHEN 5-325 MG PO TABS: ORAL_TABLET | ORAL | 0 refills | Status: DC

## 2019-05-30 DIAGNOSIS — G629 Polyneuropathy, unspecified: Secondary | ICD-10-CM | POA: Diagnosis not present

## 2019-05-30 DIAGNOSIS — M6281 Muscle weakness (generalized): Secondary | ICD-10-CM | POA: Diagnosis not present

## 2019-05-30 DIAGNOSIS — M25511 Pain in right shoulder: Secondary | ICD-10-CM | POA: Diagnosis not present

## 2019-05-31 DIAGNOSIS — G629 Polyneuropathy, unspecified: Secondary | ICD-10-CM | POA: Diagnosis not present

## 2019-05-31 DIAGNOSIS — M6281 Muscle weakness (generalized): Secondary | ICD-10-CM | POA: Diagnosis not present

## 2019-05-31 DIAGNOSIS — M25511 Pain in right shoulder: Secondary | ICD-10-CM | POA: Diagnosis not present

## 2019-06-05 DIAGNOSIS — F419 Anxiety disorder, unspecified: Secondary | ICD-10-CM | POA: Diagnosis not present

## 2019-06-05 DIAGNOSIS — G4700 Insomnia, unspecified: Secondary | ICD-10-CM | POA: Diagnosis not present

## 2019-06-05 DIAGNOSIS — F329 Major depressive disorder, single episode, unspecified: Secondary | ICD-10-CM | POA: Diagnosis not present

## 2019-06-12 DIAGNOSIS — F419 Anxiety disorder, unspecified: Secondary | ICD-10-CM | POA: Diagnosis not present

## 2019-06-12 DIAGNOSIS — G4700 Insomnia, unspecified: Secondary | ICD-10-CM | POA: Diagnosis not present

## 2019-06-12 DIAGNOSIS — F329 Major depressive disorder, single episode, unspecified: Secondary | ICD-10-CM | POA: Diagnosis not present

## 2019-06-13 ENCOUNTER — Non-Acute Institutional Stay (SKILLED_NURSING_FACILITY): Payer: Medicare Other | Admitting: Internal Medicine

## 2019-06-13 ENCOUNTER — Encounter: Payer: Self-pay | Admitting: Internal Medicine

## 2019-06-13 DIAGNOSIS — E032 Hypothyroidism due to medicaments and other exogenous substances: Secondary | ICD-10-CM | POA: Diagnosis not present

## 2019-06-13 DIAGNOSIS — I959 Hypotension, unspecified: Secondary | ICD-10-CM | POA: Diagnosis not present

## 2019-06-13 DIAGNOSIS — R519 Headache, unspecified: Secondary | ICD-10-CM

## 2019-06-13 DIAGNOSIS — R251 Tremor, unspecified: Secondary | ICD-10-CM | POA: Diagnosis not present

## 2019-06-13 DIAGNOSIS — J449 Chronic obstructive pulmonary disease, unspecified: Secondary | ICD-10-CM | POA: Diagnosis not present

## 2019-06-13 NOTE — Progress Notes (Signed)
NURSING HOME LOCATION:  [facility] ROOM NUMBER:  [id number]  CODE STATUS:  FULL CODE  PCP:  [name], MD  [place] [address]   This is a nursing facility follow up of chronic medical diagnoses.  Interim medical record and care since last [place] visit was updated with review of diagnostic studies and change in clinical status since last visit were documented.  HPI:   She is a permanent resident facility with diagnoses of advanced/oxygen dependent COPD, hypothyroidism, GERD, essential tremor, history of paroxysmal supraventricular tachycardia, diastolic congestive heart failure, chronic pain syndrome, polyneuropathy, and anxiety/depression. Staff reported low blood pressure today at 97/60.  Review of systems: She describes shortness of breath despite O2 sats of 100% on 4 L.  She states that her nose is running and the Claritin is of no benefit.  She is requesting Zyrtec.  She is on low-dose propranolol for the essential tremor.  She states that it helps "some".  She also states that the shots helped her headaches but they are now coming back.  She has a follow-up appointment with Dr. Krista Blue in August or September.  In reference to the hypotension; she has no bleeding dyscrasias.  Constitutional: No fever, significant weight change  Eyes: No redness, discharge, pain, vision change ENT/mouth: No purulent discharge, earache, change in hearing, sore throat  Cardiovascular: No chest pain, palpitations, paroxysmal nocturnal dyspnea, claudication, edema  Respiratory: No cough, sputum production, hemoptysis, significant snoring, apnea   Gastrointestinal: No heartburn, dysphagia, abdominal pain, nausea /vomiting, rectal bleeding, melena, change in bowels Genitourinary: No dysuria, hematuria, pyuria, incontinence, nocturia Musculoskeletal: No joint stiffness, joint swelling Dermatologic: No rash, pruritus, change in appearance of skin Neurologic: No dizziness,  syncope, seizures, numbness, tingling Psychiatric: No significant insomnia, anorexia Endocrine: No change in hair/skin/nails, excessive thirst, excessive hunger, excessive urination  Hematologic/lymphatic: No significant bruising, lymphadenopathy, abnormal bleeding Allergy/immunology: No urticaria, angioedema  Physical exam:  Pertinent or positive findings: It was almost 3:00 and the patient had gone back to bed because she was "tired".  She could be aroused easily.  Facies are somewhat puffy.  Eyebrows are decreased.  The mandible is edentulous.  Breath sounds are decreased.  There is some respiratory variation to the heart rate.  Second heart sound is slightly accentuated.  Dorsalis pedis pulses are stronger than the posterior tibial pulses.  Slight clubbing is suggested in the nailbeds.  She exhibits coarse tremor of the upper extremities.  General appearance: no acute distress, increased work of breathing is present.   Lymphatic: No lymphadenopathy about the head, neck, axilla. Eyes: No conjunctival inflammation or lid edema is present. There is no scleral icterus. Ears:  External ear exam shows no significant lesions or deformities.   Nose:  External nasal examination shows no deformity or inflammation. Nasal mucosa are pink and moist without lesions, exudates Oral exam:  Lips and gums are healthy appearing. There is no oropharyngeal erythema or exudate. Neck:  No thyromegaly, masses, tenderness noted.    Heart:  No gallop, murmur, click, rub .  Lungs:  without wheezes, rhonchi, rales, rubs. Abdomen: Bowel sounds are normal. Abdomen is soft and nontender with no organomegaly, hernias, masses. GU: Deferred  Extremities:  No cyanosis, edema  Neurologic exam : Balance, Rhomberg, finger to nose testing could not be completed due to clinical state Skin: Warm & dry w/o tenting. No significant lesions or rash.  See summary under each active problem in the Problem List with associated updated  therapeutic plan

## 2019-06-13 NOTE — Assessment & Plan Note (Addendum)
05/27/2019 TSH 0.09; L-thyroxine dose reduced.  Repeat TSH in 4-6 weeks.

## 2019-06-13 NOTE — Assessment & Plan Note (Addendum)
O2 sats excellent on present O2 setting & pulmonary toilet regimen.  No change in therapy.

## 2019-06-13 NOTE — Assessment & Plan Note (Signed)
06/13/2019 blood pressure 97/60. Check CBC to rule out anemia as this could be associated with hypotension and shortness of breath. Hold propranolol if systolic less than 882.

## 2019-06-13 NOTE — Patient Instructions (Signed)
See assessment and plan under each diagnosis in the problem list and acutely for this visit 

## 2019-06-13 NOTE — Assessment & Plan Note (Signed)
Neurology follow-up 5/4 with Dr. Krista Blue documented improvement in the headaches. If headaches recur or progress, low-dose Topamax to be considered.

## 2019-06-13 NOTE — Assessment & Plan Note (Addendum)
Low-dose nonselective beta-blocker may have been of some benefit.  No evidence clinically that it is worsened her COPD, but it will be held if systolic BP < 890.

## 2019-06-14 DIAGNOSIS — R630 Anorexia: Secondary | ICD-10-CM | POA: Diagnosis not present

## 2019-06-14 DIAGNOSIS — J449 Chronic obstructive pulmonary disease, unspecified: Secondary | ICD-10-CM | POA: Diagnosis not present

## 2019-06-14 DIAGNOSIS — D649 Anemia, unspecified: Secondary | ICD-10-CM | POA: Diagnosis not present

## 2019-06-14 DIAGNOSIS — G629 Polyneuropathy, unspecified: Secondary | ICD-10-CM | POA: Diagnosis not present

## 2019-06-14 LAB — COMPREHENSIVE METABOLIC PANEL
Calcium: 9.4 (ref 8.7–10.7)
GFR calc Af Amer: 58.98
GFR calc non Af Amer: 50.89

## 2019-06-14 LAB — BASIC METABOLIC PANEL
BUN: 17 (ref 4–21)
CO2: 28 — AB (ref 13–22)
Chloride: 87 — AB (ref 99–108)
Creatinine: 1 (ref 0.5–1.1)
Glucose: 109
Potassium: 4.5 (ref 3.4–5.3)
Sodium: 128 — AB (ref 137–147)

## 2019-06-14 LAB — CBC: RBC: 3.23 — AB (ref 3.87–5.11)

## 2019-06-14 LAB — CBC AND DIFFERENTIAL
HCT: 29 — AB (ref 36–46)
Hemoglobin: 9.9 — AB (ref 12.0–16.0)
Neutrophils Absolute: 6
Platelets: 276 (ref 150–399)
WBC: 7.3

## 2019-06-17 ENCOUNTER — Telehealth: Payer: Self-pay | Admitting: *Deleted

## 2019-06-17 NOTE — Telephone Encounter (Signed)
Spoke with Hilda Blades at [facility]. They wanted to know when pt next appt was. Advised it is 08/22/19 at 3pm. She verbalized understanding, nothing further needed.

## 2019-06-19 ENCOUNTER — Other Ambulatory Visit: Payer: Self-pay | Admitting: Adult Health

## 2019-06-19 DIAGNOSIS — F329 Major depressive disorder, single episode, unspecified: Secondary | ICD-10-CM | POA: Diagnosis not present

## 2019-06-19 DIAGNOSIS — F419 Anxiety disorder, unspecified: Secondary | ICD-10-CM | POA: Diagnosis not present

## 2019-06-19 DIAGNOSIS — G894 Chronic pain syndrome: Secondary | ICD-10-CM

## 2019-06-19 DIAGNOSIS — G4700 Insomnia, unspecified: Secondary | ICD-10-CM | POA: Diagnosis not present

## 2019-06-19 MED ORDER — HYDROCODONE-ACETAMINOPHEN 5-325 MG PO TABS: ORAL_TABLET | ORAL | 0 refills | Status: DC

## 2019-06-21 ENCOUNTER — Encounter: Payer: Self-pay | Admitting: Adult Health

## 2019-06-21 ENCOUNTER — Non-Acute Institutional Stay (SKILLED_NURSING_FACILITY): Payer: Medicare Other | Admitting: Adult Health

## 2019-06-21 DIAGNOSIS — E871 Hypo-osmolality and hyponatremia: Secondary | ICD-10-CM | POA: Diagnosis not present

## 2019-06-21 DIAGNOSIS — G894 Chronic pain syndrome: Secondary | ICD-10-CM | POA: Diagnosis not present

## 2019-06-21 DIAGNOSIS — I5032 Chronic diastolic (congestive) heart failure: Secondary | ICD-10-CM | POA: Diagnosis not present

## 2019-06-21 NOTE — Progress Notes (Signed)
Location:  [facility] Room Number: 202/A Place of Service:  SNF (31) Provider:  Durenda Age, DNP, FNP-BC  Patient Care Team: [name], MD as PCP - General (Internal Medicine) Medina-Vargas, [name], NP as Nurse Practitioner (Internal Medicine)  Extended Emergency Contact Information Primary Emergency Contact: Hatfield,Lisa Address: [address]          [place], [place] [address] [place] Work Phone: [phone] Mobile Phone: [phone] Relation: Daughter  Code Status:  Full Code  Goals of care: Advanced Directive information Advanced Directives 06/21/2019  Does Patient Have a Medical Advance Directive? Yes  Type of Advance Directive (No Data)  Does patient want to make changes to medical advance directive? No - Patient declined  Would patient like information on creating a medical advance directive? -     Chief Complaint  Patient presents with   Acute Visit    Low Sodium   HPI:  Pt is a 79 y.o. female seen today for sodium level of 128, dropped from 135 (01/05/19). She is currently taking Lasix 20 mg daily for CHF. No edema on on feet. On chronic O2 at 2L/minute continuously for chronic and hypoxia/COPD.  On 03/27/2019 duloxetine was increased from 90mg  to 120 mg for major depression/chronic pain.   Past Medical History:  Diagnosis Date   Anemia    Chronic diastolic heart failure (HCC)    Chronic kidney disease, stage 3    Chronic pain syndrome    Constipation    COPD (chronic obstructive pulmonary disease) (HCC)    Dysphagia    Frequent falls    Generalized muscle weakness    GERD (gastroesophageal reflux disease)    Headache    Hip pain, right    History of anxiety    History of depression    Hyperglycemia    Hypothyroid    Insomnia    Metabolic encephalopathy    Palpitation    Polyneuropathy    Spinal stenosis    Spinal stenosis, cervical region    Supraventricular  tachycardia ([place])    Thyrotoxicosis, unspecified with thyrotoxic crisis or storm    Tremor    Unsteadiness on feet    UTI (urinary tract infection)    Past Surgical History:  Procedure Laterality Date   ANKLE FRACTURE SURGERY Left    CATARACT EXTRACTION, BILATERAL     FLEXIBLE SIGMOIDOSCOPY Left 11/18/2015   Procedure: FLEXIBLE SIGMOIDOSCOPY;  Surgeon: [name], MD;  Location: [facility];  Service: Endoscopy;  Laterality: Left;   FLEXIBLE SIGMOIDOSCOPY N/A 11/20/2015   Procedure: FLEXIBLE SIGMOIDOSCOPY;  Surgeon: [name], MD;  Location: [facility] ENDOSCOPY;  Service: Endoscopy;  Laterality: N/A;   HIP ARTHROPLASTY Right 01/12/2016   Procedure: ARTHROPLASTY BIPOLAR HIP (HEMIARTHROPLASTY);  Surgeon: [name], MD;  Location: [facility];  Service: Orthopedics;  Laterality: Right;    Allergies  Allergen Reactions   Biofreeze [Menthol (Topical Analgesic)] Rash    By history Aspercreme does not cause a rash    Outpatient Encounter Medications as of 06/21/2019  Medication Sig   acetaminophen (TYLENOL) 325 MG tablet Take 650 mg by mouth every 6 (six) hours as needed.    albuterol (PROVENTIL) (2.5 MG/3ML) 0.083% nebulizer solution Take 2.5 mg by nebulization every 6 (six) hours as needed for wheezing or shortness of breath.   alum & mag hydroxide-simeth (MAALOX PLUS) 400-400-40 MG/5ML suspension Give 30cc PO q2H PRN x 24H (call MD if indigestion is not relieved w/i 24H)   aspirin 81 MG  chewable tablet Chew 81 mg by mouth daily.    calcium carbonate (TUMS - DOSED IN MG ELEMENTAL CALCIUM) 500 MG chewable tablet Chew 2 tablets by mouth every 8 (eight) hours as needed for indigestion or heartburn.   carboxymethylcellulose (REFRESH TEARS) 0.5 % SOLN Place 1 drop into both eyes in the morning and at bedtime.   digoxin (LANOXIN) 0.125 MG tablet Take 0.125 mg by mouth daily. Hold if HR <60   DULoxetine (CYMBALTA) 60 MG capsule Take 120 mg by mouth daily. For Depression   Eyelid  Cleansers (STERILID EX) Apply 1 each topically in the morning. Apply to closed eyelids and gently rub it in then wipe it.   fexofenadine (ALLEGRA) 180 MG tablet Take 180 mg by mouth daily.   flecainide (TAMBOCOR) 50 MG tablet Take 50 mg by mouth every 12 (twelve) hours.   fluticasone (FLONASE ALLERGY RELIEF) 50 MCG/ACT nasal spray Place 1 spray into both nostrils daily.   [START ON 06/24/2019] furosemide (LASIX) 20 MG tablet Take 20 mg by mouth every Monday, Wednesday, and Friday. For CHF    guaiFENesin (MUCINEX) 600 MG 12 hr tablet Take 600 mg by mouth 2 (two) times daily. COPD cough   HYDROcodone-acetaminophen (NORCO/VICODIN) 5-325 MG tablet Give **1/2 tablet by mouth q 6 hrs PRN   levothyroxine (SYNTHROID) 25 MCG tablet Take 25 mcg by mouth daily before breakfast.   Multiple Vitamins-Minerals (MULTIVITAMIN WITH MINERALS) tablet Take 1 tablet by mouth daily.   Nutritional Supplement LIQD Take 1 each by mouth 2 (two) times daily. Mighty Shake   ondansetron (ZOFRAN) 4 MG tablet Take 4 mg by mouth every 8 (eight) hours as needed for nausea.    OXYGEN Inhale 2 L into the lungs at bedtime.   pantoprazole (PROTONIX) 40 MG tablet Take 40 mg by mouth daily.   polyethylene glycol (MIRALAX / GLYCOLAX) packet Take 17 g by mouth daily.   polyvinyl alcohol (ARTIFICIAL TEARS) 1.4 % ophthalmic solution Place 2 drops into both eyes in the morning, at noon, in the evening, and at bedtime. For dry eyes    predniSONE (DELTASONE) 5 MG tablet TAKE 1 TABLET BY MOUTH DAILY (WITH BREAKFAST) AL:PFXT   primidone (MYSOLINE) 50 MG tablet Take 100 mg by mouth at bedtime.    propranolol ER (INDERAL LA) 60 MG 24 hr capsule Take 1 capsule (60 mg total) by mouth daily.   sennosides-docusate sodium (SENOKOT-S) 8.6-50 MG tablet Take 1 tablet by mouth 2 (two) times daily.    traZODone (DESYREL) 50 MG tablet Take 1/2 tab =25 mg by mouth at bedtime   traZODone (DESYREL) 50 MG tablet Take 25 mg by mouth 2 (two)  times daily. 1/4 tablet to = 12.5 mg   umeclidinium bromide (INCRUSE ELLIPTA) 62.5 MCG/INH AEPB Inhale 1 puff into the lungs daily. (SHAKE WELL)   [DISCONTINUED] loratadine (CLARITIN) 10 MG tablet Take 10 mg by mouth at bedtime.   [DISCONTINUED] Nutritional Supplements (ENSURE PO) Take by mouth. ENSURE LIQUID Give 237 ml po daily   No facility-administered encounter medications on file as of 06/21/2019.    Review of Systems  GENERAL: No change in appetite, no fatigue, no weight changes, no fever, chills MOUTH and THROAT: Denies oral discomfort, gingival pain or bleeding RESPIRATORY: no cough, SOB, DOE, wheezing, hemoptysis CARDIAC: No chest pain, edema or palpitations GI: No abdominal pain, diarrhea, constipation, heart burn, nausea or vomiting GU: Denies dysuria, frequency, hematuria, incontinence, or discharge NEUROLOGICAL: Denies dizziness, syncope, numbness PSYCHIATRIC: Denies feelings of depression or  anxiety. No report of hallucinations, insomnia, paranoia, or agitation   Immunization History  Administered Date(s) Administered   Influenza, High Dose Seasonal PF 10/04/2016   Influenza,inj,Quad PF,6+ Mos 01/13/2016   Influenza-Unspecified 11/10/2017, 10/09/2018   Moderna SARS-COVID-2 Vaccination 01/28/2019, 02/25/2019   Pneumococcal-Unspecified 10/11/2011   Tdap 01/23/2016   Pertinent  Health Maintenance Due  Topic Date Due   INFLUENZA VACCINE  08/11/2019   PNA vac Low Risk Adult  Completed   DEXA SCAN  Discontinued   Fall Risk  10/24/2018 07/19/2018 01/17/2018 11/09/2017 07/14/2017  Falls in the past year? 0 0 0 No No  Number falls in past yr: 0 0 0 - -  Injury with Fall? 0 0 0 - -  Comment - - - - -  Risk Factor Category  - - - - -  Follow up - - Falls evaluation completed - -     Vitals:   06/21/19 1330  BP: (!) 144/66  Pulse: 88  Resp: 20  Temp: 97.8 F (36.6 C)  TempSrc: Oral  SpO2: 95%  Weight: 133 lb 3.2 oz (60.4 kg)  Height: 5\' 8"  (1.727 m)    Body mass index is 20.25 kg/m.  Physical Exam  GENERAL APPEARANCE: Well nourished. In no acute distress. Normal body habitus SKIN:  Skin is warm and dry.  MOUTH and THROAT: Lips are without lesions. Oral mucosa is moist and without lesions. Tongue is normal in shape, size, and color and without lesions RESPIRATORY: Breathing is even & unlabored, BS CTAB CARDIAC:  no murmur,no extra heart sounds, no edema GI: Abdomen soft, normal BS, no masses, no tenderness EXTREMITIES: Able to move x4 extremities NEUROLOGICAL: There is no tremor. Speech is clear PSYCHIATRIC: Alert and oriented X 3. Affect and behavior are appropriate  Labs reviewed: Recent Labs    11/23/18 0000 01/05/19 0000  NA 137 135*  K 4.7 4.6  CL 99 96*  CO2 28* 23*  BUN 14 12  CREATININE 1.0 0.9  CALCIUM 8.7 9.2    Recent Labs    08/17/18 0000 11/23/18 0000 01/09/19 0000  WBC 6.0 4.4 5.0  HGB 10.5* 10.3* 9.7*  HCT 31* 30* 29*  PLT 270 225 236   Lab Results  Component Value Date   TSH 0.09 (A) 05/27/2019   Lab Results  Component Value Date   HGBA1C 5.2 12/01/2015   Lab Results  Component Value Date   CHOL 107 12/01/2015   HDL 27 (L) 12/01/2015   LDLCALC 59 12/01/2015   TRIG 107 12/01/2015   CHOLHDL 4.0 12/01/2015     Assessment/Plan  1. Hyponatremia - Na 128, will decrease Lasix from 20 mg daily to every MWF - repeat BMP in 1 week  2. Chronic pain syndrome - continue duloxetine 120 mg daily - monitor sodium level in 1 week  3. Chronic diastolic CHF (congestive heart failure) (HCC) - decrease Lasix 20 mg daily to MWF - monitor for edema and SOB     Family/ staff Communication: Discussed plan of care with resident and charge nurse.  Labs/tests ordered:  BMP in 1 week  Goals of care:   Long-term care   Durenda Age, DNP, FNP-BC [facility] [phone] (Monday-Friday 8:00 a.m. - 5:00 p.m.) [phone] (after hours)

## 2019-06-26 ENCOUNTER — Encounter: Payer: Self-pay | Admitting: Adult Health

## 2019-06-26 ENCOUNTER — Non-Acute Institutional Stay (SKILLED_NURSING_FACILITY): Payer: Medicare Other | Admitting: Adult Health

## 2019-06-26 DIAGNOSIS — F329 Major depressive disorder, single episode, unspecified: Secondary | ICD-10-CM | POA: Diagnosis not present

## 2019-06-26 DIAGNOSIS — I5032 Chronic diastolic (congestive) heart failure: Secondary | ICD-10-CM | POA: Diagnosis not present

## 2019-06-26 DIAGNOSIS — E034 Atrophy of thyroid (acquired): Secondary | ICD-10-CM | POA: Diagnosis not present

## 2019-06-26 DIAGNOSIS — Z7189 Other specified counseling: Secondary | ICD-10-CM | POA: Diagnosis not present

## 2019-06-26 DIAGNOSIS — G4700 Insomnia, unspecified: Secondary | ICD-10-CM | POA: Diagnosis not present

## 2019-06-26 DIAGNOSIS — F419 Anxiety disorder, unspecified: Secondary | ICD-10-CM | POA: Diagnosis not present

## 2019-06-26 DIAGNOSIS — J449 Chronic obstructive pulmonary disease, unspecified: Secondary | ICD-10-CM

## 2019-06-26 DIAGNOSIS — R251 Tremor, unspecified: Secondary | ICD-10-CM

## 2019-06-26 DIAGNOSIS — I471 Supraventricular tachycardia: Secondary | ICD-10-CM

## 2019-06-26 NOTE — Progress Notes (Signed)
Location:  [facility] Room Number: 026-V Place of Service:  SNF (31) Provider:  Durenda Age, DNP, FNP-BC  Patient Care Team: [name], MD as PCP - General (Internal Medicine) Medina-Vargas, [name], NP as Nurse Practitioner (Internal Medicine)  Extended Emergency Contact Information Primary Emergency Contact: Hatfield,Lisa Address: [address]          [place], [place] [address] [place] Work Phone: [phone] Mobile Phone: [phone] Relation: Daughter  Code Status:  Full Code  Goals of care: Advanced Directive information Advanced Directives 06/21/2019  Does Patient Have a Medical Advance Directive? Yes  Type of Advance Directive (No Data)  Does patient want to make changes to medical advance directive? No - Patient declined  Would patient like information on creating a medical advance directive? -     Chief Complaint  Patient presents with   Advanced Directive    Patient is seen for a Care Plan Meeting    HPI:  Pt is a 79 y.o. female who had a care plan meeting attended by dietician, NP, MDS coordinator and social worker. Resident was invited but declined. Daughter was called via telephone but did not answer call. She remains to be full code.  Discussed medications, vital signs and weights. She is noncompliant with her oxygen. Staff reported that she sometimes does not use oxygen then complains that she is short of breath. She had 10.8 lbs, 15% in 180 days.  She is currently taking supplements such as Ensure, mighty shake.  She is reported to be having anxiety related to multiple chronic health complaints such as tremors, S OB, pain and frequently asked for MD/NP.  She is a long-term care resident of [facility].  She has a PMH of COPD, spinal stenosis, depression and anxiety.  The meeting lasted for 25 minutes.   Past Medical History:  Diagnosis Date   Anemia    Chronic diastolic heart  failure (HCC)    Chronic kidney disease, stage 3    Chronic pain syndrome    Constipation    COPD (chronic obstructive pulmonary disease) (HCC)    Dysphagia    Frequent falls    Generalized muscle weakness    GERD (gastroesophageal reflux disease)    Headache    Hip pain, right    History of anxiety    History of depression    Hyperglycemia    Hypothyroid    Insomnia    Metabolic encephalopathy    Palpitation    Polyneuropathy    Spinal stenosis    Spinal stenosis, cervical region    Supraventricular tachycardia ([place])    Thyrotoxicosis, unspecified with thyrotoxic crisis or storm    Tremor    Unsteadiness on feet    UTI (urinary tract infection)    Past Surgical History:  Procedure Laterality Date   ANKLE FRACTURE SURGERY Left    CATARACT EXTRACTION, BILATERAL     FLEXIBLE SIGMOIDOSCOPY Left 11/18/2015   Procedure: FLEXIBLE SIGMOIDOSCOPY;  Surgeon: [name], MD;  Location: [facility];  Service: Endoscopy;  Laterality: Left;   FLEXIBLE SIGMOIDOSCOPY N/A 11/20/2015   Procedure: FLEXIBLE SIGMOIDOSCOPY;  Surgeon: [name], MD;  Location: [facility] ENDOSCOPY;  Service: Endoscopy;  Laterality: N/A;   HIP ARTHROPLASTY Right 01/12/2016   Procedure: ARTHROPLASTY BIPOLAR HIP (HEMIARTHROPLASTY);  Surgeon: [name], MD;  Location: [facility];  Service: Orthopedics;  Laterality: Right;    Allergies  Allergen Reactions   Biofreeze [Menthol (Topical Analgesic)] Rash  By history Aspercreme does not cause a rash    Outpatient Encounter Medications as of 06/26/2019  Medication Sig   acetaminophen (TYLENOL) 325 MG tablet Take 650 mg by mouth every 6 (six) hours as needed.    albuterol (PROVENTIL) (2.5 MG/3ML) 0.083% nebulizer solution Take 2.5 mg by nebulization every 6 (six) hours as needed for wheezing or shortness of breath.   alum & mag hydroxide-simeth (MAALOX PLUS) 400-400-40 MG/5ML suspension Give 30cc PO q2H PRN x 24H (call MD if indigestion is not  relieved w/i 24H)   aspirin 81 MG chewable tablet Chew 81 mg by mouth daily.    calcium carbonate (TUMS - DOSED IN MG ELEMENTAL CALCIUM) 500 MG chewable tablet Chew 2 tablets by mouth every 8 (eight) hours as needed for indigestion or heartburn.   carboxymethylcellulose (REFRESH TEARS) 0.5 % SOLN Place 1 drop into both eyes in the morning and at bedtime.   digoxin (LANOXIN) 0.125 MG tablet Take 0.125 mg by mouth daily. Hold if HR <60   DULoxetine (CYMBALTA) 60 MG capsule Take 120 mg by mouth daily. For Depression   Eyelid Cleansers (STERILID EX) Apply 1 each topically in the morning. Apply to closed eyelids and gently rub it in then wipe it.   fexofenadine (ALLEGRA) 180 MG tablet Take 180 mg by mouth daily.   flecainide (TAMBOCOR) 50 MG tablet Take 50 mg by mouth every 12 (twelve) hours.   fluticasone (FLONASE ALLERGY RELIEF) 50 MCG/ACT nasal spray Place 1 spray into both nostrils daily.   furosemide (LASIX) 20 MG tablet Take 20 mg by mouth every Monday, Wednesday, and Friday. For CHF    guaiFENesin (MUCINEX) 600 MG 12 hr tablet Take 600 mg by mouth 2 (two) times daily. COPD cough   HYDROcodone-acetaminophen (NORCO/VICODIN) 5-325 MG tablet Give **1/2 tablet by mouth q 6 hrs PRN   levothyroxine (SYNTHROID) 25 MCG tablet Take 25 mcg by mouth daily before breakfast.   Multiple Vitamins-Minerals (MULTIVITAMIN WITH MINERALS) tablet Take 1 tablet by mouth daily.   Nutritional Supplement LIQD Take 1 each by mouth 2 (two) times daily. Mighty Shake   ondansetron (ZOFRAN) 4 MG tablet Take 4 mg by mouth every 8 (eight) hours as needed for nausea.    OXYGEN Inhale 2 L into the lungs at bedtime.   pantoprazole (PROTONIX) 40 MG tablet Take 40 mg by mouth daily.   polyethylene glycol (MIRALAX / GLYCOLAX) packet Take 17 g by mouth daily.   polyvinyl alcohol (ARTIFICIAL TEARS) 1.4 % ophthalmic solution Place 2 drops into both eyes in the morning, at noon, in the evening, and at bedtime. For  dry eyes    predniSONE (DELTASONE) 5 MG tablet TAKE 1 TABLET BY MOUTH DAILY (WITH BREAKFAST) QM:GQQP   primidone (MYSOLINE) 50 MG tablet Take 100 mg by mouth at bedtime.    propranolol ER (INDERAL LA) 60 MG 24 hr capsule Take 1 capsule (60 mg total) by mouth daily.   sennosides-docusate sodium (SENOKOT-S) 8.6-50 MG tablet Take 1 tablet by mouth 2 (two) times daily.    traZODone (DESYREL) 50 MG tablet Take 1/2 tab =25 mg by mouth at bedtime   traZODone (DESYREL) 50 MG tablet Take 25 mg by mouth 2 (two) times daily. 1/4 tablet to = 12.5 mg   umeclidinium bromide (INCRUSE ELLIPTA) 62.5 MCG/INH AEPB Inhale 1 puff into the lungs daily. (SHAKE WELL)   No facility-administered encounter medications on file as of 06/26/2019.    Review of Systems  GENERAL: No fever, chills or  weakness MOUTH and THROAT: Denies oral discomfort, gingival pain or bleeding RESPIRATORY: no cough, SOB, DOE, wheezing, hemoptysis CARDIAC: No chest pain, edema or palpitations GI: No abdominal pain, diarrhea, constipation, heart burn, nausea or vomiting GU: Denies dysuria, frequency, hematuria, incontinence, or discharge NEUROLOGICAL: Denies dizziness, syncope, numbness, or headache PSYCHIATRIC: Denies feelings of depression or anxiety. No report of hallucinations, insomnia, paranoia, or agitation   Immunization History  Administered Date(s) Administered   Influenza, High Dose Seasonal PF 10/04/2016   Influenza,inj,Quad PF,6+ Mos 01/13/2016   Influenza-Unspecified 11/10/2017, 10/09/2018   Moderna SARS-COVID-2 Vaccination 01/28/2019, 02/25/2019   Pneumococcal-Unspecified 10/11/2011   Tdap 01/23/2016   Pertinent  Health Maintenance Due  Topic Date Due   INFLUENZA VACCINE  08/11/2019   PNA vac Low Risk Adult  Completed   DEXA SCAN  Discontinued   Fall Risk  10/24/2018 07/19/2018 01/17/2018 11/09/2017 07/14/2017  Falls in the past year? 0 0 0 No No  Number falls in past yr: 0 0 0 - -  Injury with Fall? 0 0  0 - -  Comment - - - - -  Risk Factor Category  - - - - -  Follow up - - Falls evaluation completed - -     Vitals:   06/26/19 0832  BP: 128/68  Pulse: 80  Resp: 20  Temp: 97.8 F (36.6 C)  TempSrc: Oral  SpO2: 98%  Weight: 133 lb 3.2 oz (60.4 kg)  Height: 5\' 8"  (1.727 m)   Body mass index is 20.25 kg/m.  Physical Exam  GENERAL APPEARANCE: Well nourished. In no acute distress. Normal body habitus SKIN:  Skin is warm and dry.  MOUTH and THROAT: Lips are without lesions. Oral mucosa is moist and without lesions. Tongue is normal in shape, size, and color and without lesions RESPIRATORY: Breathing is even & unlabored, BS CTAB CARDIAC:  no murmur,no extra heart sounds, no edema GI: Abdomen soft, normal BS, no masses, no tenderness EXTREMITIES:  Able to move X 4 extremities. NEUROLOGICAL: + tremor. Speech is clear. Alert and oriented X 3. PSYCHIATRIC:  Affect and behavior are appropriate  Labs reviewed: Recent Labs    11/23/18 0000 01/05/19 0000 06/14/19 0000  NA 137 135* 128*  K 4.7 4.6 4.5  CL 99 96* 87*  CO2 28* 23* 28*  BUN 14 12 17   CREATININE 1.0 0.9 1.0  CALCIUM 8.7 9.2 9.4    Recent Labs    11/23/18 0000 01/09/19 0000 06/14/19 0000  WBC 4.4 5.0 7.3  NEUTROABS  --   --  6  HGB 10.3* 9.7* 9.9*  HCT 30* 29* 29*  PLT 225 236 276   Lab Results  Component Value Date   TSH 0.09 (A) 05/27/2019   Lab Results  Component Value Date   HGBA1C 5.2 12/01/2015   Lab Results  Component Value Date   CHOL 107 12/01/2015   HDL 27 (L) 12/01/2015   LDLCALC 59 12/01/2015   TRIG 107 12/01/2015   CHOLHDL 4.0 12/01/2015    Assessment/Plan  1. Advance care planning - Remains to be full code -Discussed medications, vital signs and weights  2. Hypothyroidism due to acquired atrophy of thyroid Lab Results  Component Value Date   TSH 0.09 (A) 05/27/2019   - recently decreased levothyroxine from 50 mcg to 25 mcg daily  3. Chronic diastolic CHF (congestive  heart failure) (HCC) -Stable, continue furosemide 20 mg daily on MWF  4. Chronic obstructive pulmonary disease, unspecified COPD type ([place]) -Noncompliant with her  use of oxygen -Continue PRN albuterol, Incruse Ellipta and prednisone  5. Tremor -Continue and primidone and propranolol ER  6. Paroxysmal SVT (supraventricular tachycardia) (HCC) -Rate controlled, continue flecainide, digoxin     Family/ staff Communication: Discussed plan of care with IDT.  Labs/tests ordered: None  Goals of care:   Long-term care   Durenda Age, DNP, FNP-BC [facility] [phone] (Monday-Friday 8:00 a.m. - 5:00 p.m.) [phone] (after hours)

## 2019-07-02 LAB — BASIC METABOLIC PANEL
BUN: 10 (ref 4–21)
CO2: 30 — AB (ref 13–22)
Chloride: 96 — AB (ref 99–108)
Creatinine: 0.9 (ref 0.5–1.1)
Glucose: 81
Potassium: 4.4 (ref 3.4–5.3)
Sodium: 135 — AB (ref 137–147)

## 2019-07-02 LAB — COMPREHENSIVE METABOLIC PANEL
Calcium: 9.3 (ref 8.7–10.7)
GFR calc Af Amer: 70.22
GFR calc non Af Amer: 60.59

## 2019-07-10 DIAGNOSIS — F329 Major depressive disorder, single episode, unspecified: Secondary | ICD-10-CM | POA: Diagnosis not present

## 2019-07-10 DIAGNOSIS — G4700 Insomnia, unspecified: Secondary | ICD-10-CM | POA: Diagnosis not present

## 2019-07-10 DIAGNOSIS — F419 Anxiety disorder, unspecified: Secondary | ICD-10-CM | POA: Diagnosis not present

## 2019-07-11 LAB — TSH: TSH: 3.4 (ref 0.41–5.90)

## 2019-07-12 ENCOUNTER — Encounter: Payer: Self-pay | Admitting: Adult Health

## 2019-07-12 ENCOUNTER — Non-Acute Institutional Stay (SKILLED_NURSING_FACILITY): Payer: Medicare Other | Admitting: Adult Health

## 2019-07-12 DIAGNOSIS — I5032 Chronic diastolic (congestive) heart failure: Secondary | ICD-10-CM

## 2019-07-12 DIAGNOSIS — J449 Chronic obstructive pulmonary disease, unspecified: Secondary | ICD-10-CM

## 2019-07-12 DIAGNOSIS — E871 Hypo-osmolality and hyponatremia: Secondary | ICD-10-CM | POA: Diagnosis not present

## 2019-07-12 DIAGNOSIS — I471 Supraventricular tachycardia: Secondary | ICD-10-CM | POA: Diagnosis not present

## 2019-07-12 DIAGNOSIS — E034 Atrophy of thyroid (acquired): Secondary | ICD-10-CM

## 2019-07-12 DIAGNOSIS — G894 Chronic pain syndrome: Secondary | ICD-10-CM

## 2019-07-12 DIAGNOSIS — R251 Tremor, unspecified: Secondary | ICD-10-CM

## 2019-07-12 NOTE — Progress Notes (Signed)
Location:  [place] Room Number: 465-K Place of Service:  SNF (31) Provider:  Durenda Age, DNP, FNP-BC  Patient Care Team: [name], MD as PCP - General (Internal Medicine) Medina-Vargas, [name], NP as Nurse Practitioner (Internal Medicine)  Extended Emergency Contact Information Primary Emergency Contact: Hatfield,Lisa Address: [address]          [place], [place] [address] [place] Work Phone: [phone] Mobile Phone: [phone] Relation: Daughter  Code Status:  FULL CODE  Goals of care: Advanced Directive information Advanced Directives 06/21/2019  Does Patient Have a Medical Advance Directive? Yes  Type of Advance Directive (No Data)  Does patient want to make changes to medical advance directive? No - Patient declined  Would patient like information on creating a medical advance directive? -     Chief Complaint  Patient presents with   Medical Management of Chronic Issues    Routine [facility] visit    HPI:  Pt is an 79 y.o. female seen today for medical management of chronic diseases.  She is a long-term care resident of [facility].  She has a PMH of COPD, spinal stenosis, depression and anxiety.  She was seen in her room today. She is noted to be happy today.  BMP done on 07/02/19 showed Na improved from 128 to 135. Lasix was decreased to 20 mg Q MWF from 20 mg daily.  Latest TSH was 3.4, 07/11/19.  She currently takes levothyroxine 25 mcg daily   Past Medical History:  Diagnosis Date   Anemia    Chronic diastolic heart failure (HCC)    Chronic kidney disease, stage 3    Chronic pain syndrome    Constipation    COPD (chronic obstructive pulmonary disease) (HCC)    Dysphagia    Frequent falls    Generalized muscle weakness    GERD (gastroesophageal reflux disease)    Headache    Hip pain, right    History of anxiety    History of depression     Hyperglycemia    Hypothyroid    Insomnia    Metabolic encephalopathy    Palpitation    Polyneuropathy    Spinal stenosis    Spinal stenosis, cervical region    Supraventricular tachycardia ([place])    Thyrotoxicosis, unspecified with thyrotoxic crisis or storm    Tremor    Unsteadiness on feet    UTI (urinary tract infection)    Past Surgical History:  Procedure Laterality Date   ANKLE FRACTURE SURGERY Left    CATARACT EXTRACTION, BILATERAL     FLEXIBLE SIGMOIDOSCOPY Left 11/18/2015   Procedure: FLEXIBLE SIGMOIDOSCOPY;  Surgeon: [name], MD;  Location: [place];  Service: Endoscopy;  Laterality: Left;   FLEXIBLE SIGMOIDOSCOPY N/A 11/20/2015   Procedure: FLEXIBLE SIGMOIDOSCOPY;  Surgeon: [name], MD;  Location: [facility] ENDOSCOPY;  Service: Endoscopy;  Laterality: N/A;   HIP ARTHROPLASTY Right 01/12/2016   Procedure: ARTHROPLASTY BIPOLAR HIP (HEMIARTHROPLASTY);  Surgeon: [name], MD;  Location: [facility];  Service: Orthopedics;  Laterality: Right;    Allergies  Allergen Reactions   Biofreeze [Menthol (Topical Analgesic)] Rash    By history Aspercreme does not cause a rash    Outpatient Encounter Medications as of 07/12/2019  Medication Sig   acetaminophen (TYLENOL) 325 MG tablet Take 650 mg by mouth every 6 (six) hours as needed.    albuterol (PROVENTIL) (2.5 MG/3ML) 0.083% nebulizer solution Take 2.5 mg by nebulization every 6 (six) hours  as needed for wheezing or shortness of breath.   alum & mag hydroxide-simeth (MAALOX PLUS) 400-400-40 MG/5ML suspension Give 30cc PO q2H PRN x 24H (call MD if indigestion is not relieved w/i 24H)   aspirin 81 MG chewable tablet Chew 81 mg by mouth daily.    calcium carbonate (TUMS - DOSED IN MG ELEMENTAL CALCIUM) 500 MG chewable tablet Chew 2 tablets by mouth every 8 (eight) hours as needed for indigestion or heartburn.   digoxin (LANOXIN) 0.125 MG tablet Take 0.125 mg by mouth daily. Hold if HR <60   DULoxetine  (CYMBALTA) 60 MG capsule Take 120 mg by mouth daily. For Depression   Eyelid Cleansers (STERILID EX) Apply 1 each topically in the morning. Apply to closed eyelids and gently rub it in then wipe it.   fexofenadine (ALLEGRA) 180 MG tablet Take 180 mg by mouth daily.   flecainide (TAMBOCOR) 50 MG tablet Take 50 mg by mouth every 12 (twelve) hours.   fluticasone (FLONASE ALLERGY RELIEF) 50 MCG/ACT nasal spray Place 1 spray into both nostrils daily as needed.    furosemide (LASIX) 20 MG tablet Take 20 mg by mouth every Monday, Wednesday, and Friday. For CHF    guaiFENesin (MUCINEX) 600 MG 12 hr tablet Take 600 mg by mouth 2 (two) times daily. COPD cough   HYDROcodone-acetaminophen (NORCO/VICODIN) 5-325 MG tablet Give **1/2 tablet by mouth q 6 hrs PRN   levothyroxine (SYNTHROID) 25 MCG tablet Take 25 mcg by mouth daily before breakfast.   Multiple Vitamins-Minerals (MULTIVITAMIN WITH MINERALS) tablet Take 1 tablet by mouth daily.   Nutritional Supplement LIQD Take 1 each by mouth 2 (two) times daily. Mighty Shake   ondansetron (ZOFRAN) 4 MG tablet Take 4 mg by mouth every 8 (eight) hours as needed for nausea.    OXYGEN Inhale 2 L into the lungs at bedtime.   pantoprazole (PROTONIX) 40 MG tablet Take 40 mg by mouth daily.   polyethylene glycol (MIRALAX / GLYCOLAX) packet Take 17 g by mouth daily.   polyvinyl alcohol (ARTIFICIAL TEARS) 1.4 % ophthalmic solution Place 2 drops into both eyes in the morning, at noon, in the evening, and at bedtime. For dry eyes    predniSONE (DELTASONE) 5 MG tablet TAKE 1 TABLET BY MOUTH DAILY (WITH BREAKFAST) XH:[id number]   primidone (MYSOLINE) 50 MG tablet Take 100 mg by mouth at bedtime.    propranolol ER (INDERAL LA) 60 MG 24 hr capsule Take 1 capsule (60 mg total) by mouth daily.   sennosides-docusate sodium (SENOKOT-S) 8.6-50 MG tablet Take 1 tablet by mouth 2 (two) times daily.    traZODone (DESYREL) 50 MG tablet Take 1/2 tab =25 mg by mouth at  bedtime   traZODone (DESYREL) 50 MG tablet Take 25 mg by mouth 2 (two) times daily. 1/4 tablet to = 12.5 mg   umeclidinium bromide (INCRUSE ELLIPTA) 62.5 MCG/INH AEPB Inhale 1 puff into the lungs daily. (SHAKE WELL)   [DISCONTINUED] carboxymethylcellulose (REFRESH TEARS) 0.5 % SOLN Place 1 drop into both eyes in the morning and at bedtime.   No facility-administered encounter medications on file as of 07/12/2019.    Review of Systems  GENERAL: No change in appetite, no fatigue, no weight changes, no fever, chills or weakness MOUTH and THROAT: Denies oral discomfort, gingival pain or bleeding RESPIRATORY: no wheezing nor hemoptysis CARDIAC: No chest pain, edema or palpitations GI: No abdominal pain, diarrhea, constipation, heart burn, nausea or vomiting GU: Denies dysuria, frequency, hematuria, incontinence, or discharge NEUROLOGICAL: Denies  dizziness, syncope or numbness PSYCHIATRIC: Denies feelings of depression or anxiety. No report of hallucinations, insomnia, paranoia, or agitation   Immunization History  Administered Date(s) Administered   Influenza, High Dose Seasonal PF 10/04/2016   Influenza,inj,Quad PF,6+ Mos 01/13/2016   Influenza-Unspecified 11/10/2017, 10/09/2018   Moderna SARS-COVID-2 Vaccination 01/28/2019, 02/25/2019   Pneumococcal-Unspecified 10/11/2011   Tdap 01/23/2016   Pertinent  Health Maintenance Due  Topic Date Due   INFLUENZA VACCINE  08/11/2019   PNA vac Low Risk Adult  Completed   DEXA SCAN  Discontinued   Fall Risk  10/24/2018 07/19/2018 01/17/2018 11/09/2017 07/14/2017  Falls in the past year? 0 0 0 No No  Number falls in past yr: 0 0 0 - -  Injury with Fall? 0 0 0 - -  Comment - - - - -  Risk Factor Category  - - - - -  Follow up - - Falls evaluation completed - -     Vitals:   07/12/19 1126  BP: 127/70  Pulse: 78  Resp: 16  Temp: (!) 97.1 F (36.2 C)  TempSrc: Oral  SpO2: 95%  Weight: 130 lb 3.2 oz (59.1 kg)  Height: 5\' 8"   (1.727 m)   Body mass index is 19.8 kg/m.  Physical Exam  GENERAL APPEARANCE:  In no acute distress.  SKIN:  Skin is warm and dry.  MOUTH and THROAT: Lips are without lesions. Oral mucosa is moist and without lesions. Tongue is normal in shape, size, and color and without lesions RESPIRATORY: Breathing is even & unlabored, BS CTAB CARDIAC: no murmur,no extra heart sounds, no edema GI: Abdomen soft, normal BS, no masses, no tenderness EXTREMITIES: Able to move x4 extremities NEUROLOGICAL: + tremor. Speech is clear. Alert and oriented X 3. PSYCHIATRIC:  Affect and behavior are appropriate  Labs reviewed: Recent Labs    01/05/19 0000 06/14/19 0000 07/02/19 0000  NA 135* 128* 135*  K 4.6 4.5 4.4  CL 96* 87* 96*  CO2 23* 28* 30*  BUN 12 17 10   CREATININE 0.9 1.0 0.9  CALCIUM 9.2 9.4 9.3    Recent Labs    11/23/18 0000 01/09/19 0000 06/14/19 0000  WBC 4.4 5.0 7.3  NEUTROABS  --   --  6  HGB 10.3* 9.7* 9.9*  HCT 30* 29* 29*  PLT 225 236 276   Lab Results  Component Value Date   TSH 3.40 07/11/2019   Lab Results  Component Value Date   HGBA1C 5.2 12/01/2015   Lab Results  Component Value Date   CHOL 107 12/01/2015   HDL 27 (L) 12/01/2015   LDLCALC 59 12/01/2015   TRIG 107 12/01/2015   CHOLHDL 4.0 12/01/2015    Assessment/Plan  1. Hyponatremia Lab Results  Component Value Date   NA 135 (A) 07/02/2019   K 4.4 07/02/2019   CO2 30 (A) 07/02/2019   GLUCOSE 96 10/07/2016   BUN 10 07/02/2019   CREATININE 0.9 07/02/2019   CALCIUM 9.3 07/02/2019   GFRNONAA 60.59 07/02/2019   GFRAA 70.22 07/02/2019   - Na improved  2. Paroxysmal SVT (supraventricular tachycardia) (HCC) -Rate controlled, continue flecainide acetate and digoxin  3. Chronic diastolic CHF (congestive heart failure) (HCC) -Stable, recently decreased Lasix 20 mg daily on MWF  4. Chronic obstructive pulmonary disease, unspecified COPD type (HCC) -No wheezing, continue Mucinex, PRN albuterol,  Incruse Ellipta and prednisone  5. Hypothyroidism due to acquired atrophy of thyroid Lab Results  Component Value Date   TSH 3.40 07/11/2019   -  Continue levothyroxine  6. Tremor -Stable, continue propranolol and primidone  7. Chronic pain syndrome -Stable, continue PRN Norco and PRN acetaminophen    Family/ staff Communication: Discussed plan of care with resident and charge nurse.  Labs/tests ordered: None  Goals of care:   Long-term care   Durenda Age, DNP, FNP-BC [facility] [phone] (Monday-Friday 8:00 a.m. - 5:00 p.m.) [phone] (after hours)

## 2019-07-17 DIAGNOSIS — G4700 Insomnia, unspecified: Secondary | ICD-10-CM | POA: Diagnosis not present

## 2019-07-17 DIAGNOSIS — F419 Anxiety disorder, unspecified: Secondary | ICD-10-CM | POA: Diagnosis not present

## 2019-07-17 DIAGNOSIS — F329 Major depressive disorder, single episode, unspecified: Secondary | ICD-10-CM | POA: Diagnosis not present

## 2019-07-24 ENCOUNTER — Other Ambulatory Visit: Payer: Self-pay

## 2019-07-24 ENCOUNTER — Telehealth: Payer: Self-pay | Admitting: Neurology

## 2019-07-24 ENCOUNTER — Ambulatory Visit
Admission: RE | Admit: 2019-07-24 | Discharge: 2019-07-24 | Disposition: A | Discharge status: Home / Self Care | Payer: Medicare Other | Source: Ambulatory Visit | Attending: Neurology | Admitting: Neurology

## 2019-07-24 DIAGNOSIS — R519 Headache, unspecified: Secondary | ICD-10-CM

## 2019-07-24 DIAGNOSIS — G4700 Insomnia, unspecified: Secondary | ICD-10-CM | POA: Diagnosis not present

## 2019-07-24 DIAGNOSIS — G250 Essential tremor: Secondary | ICD-10-CM

## 2019-07-24 DIAGNOSIS — G8929 Other chronic pain: Secondary | ICD-10-CM

## 2019-07-24 DIAGNOSIS — F329 Major depressive disorder, single episode, unspecified: Secondary | ICD-10-CM | POA: Diagnosis not present

## 2019-07-24 DIAGNOSIS — F419 Anxiety disorder, unspecified: Secondary | ICD-10-CM | POA: Diagnosis not present

## 2019-07-24 NOTE — Telephone Encounter (Signed)
IMPRESSION: No acute intracranial process.  Mild cerebral atrophy. Interval progression of chronic microvascular ischemic changes.  Upper cervical spondylosis with reversal of lordosis.  Increased size of 1.9 cm dorsal midline neck cystic lesion, likely a sebaceous cyst.  Please call patient, MRI of the brain showed no acute abnormality, continue with evidence of generalized atrophy, mild to moderate supratentorium small vessel disease.

## 2019-07-25 NOTE — Telephone Encounter (Signed)
Attempted to call pt, number on file is assistant living facility- no one available to take call Attempted to call daughter, number not working   Results faxed to facility   Confirmation received

## 2019-07-29 ENCOUNTER — Other Ambulatory Visit: Payer: Self-pay | Admitting: Adult Health

## 2019-07-29 DIAGNOSIS — F329 Major depressive disorder, single episode, unspecified: Secondary | ICD-10-CM | POA: Diagnosis not present

## 2019-07-29 DIAGNOSIS — G4700 Insomnia, unspecified: Secondary | ICD-10-CM | POA: Diagnosis not present

## 2019-07-29 DIAGNOSIS — G894 Chronic pain syndrome: Secondary | ICD-10-CM

## 2019-07-29 DIAGNOSIS — F419 Anxiety disorder, unspecified: Secondary | ICD-10-CM | POA: Diagnosis not present

## 2019-07-29 MED ORDER — HYDROCODONE-ACETAMINOPHEN 5-325 MG PO TABS: ORAL_TABLET | ORAL | 0 refills | Status: DC

## 2019-07-30 LAB — HM HEPATITIS C SCREENING LAB: HM Hepatitis Screen: NEGATIVE

## 2019-08-07 DIAGNOSIS — F329 Major depressive disorder, single episode, unspecified: Secondary | ICD-10-CM | POA: Diagnosis not present

## 2019-08-07 DIAGNOSIS — F419 Anxiety disorder, unspecified: Secondary | ICD-10-CM | POA: Diagnosis not present

## 2019-08-07 DIAGNOSIS — G4700 Insomnia, unspecified: Secondary | ICD-10-CM | POA: Diagnosis not present

## 2019-08-14 DIAGNOSIS — F419 Anxiety disorder, unspecified: Secondary | ICD-10-CM | POA: Diagnosis not present

## 2019-08-14 DIAGNOSIS — G4700 Insomnia, unspecified: Secondary | ICD-10-CM | POA: Diagnosis not present

## 2019-08-14 DIAGNOSIS — F329 Major depressive disorder, single episode, unspecified: Secondary | ICD-10-CM | POA: Diagnosis not present

## 2019-08-16 ENCOUNTER — Encounter: Payer: Self-pay | Admitting: Adult Health

## 2019-08-16 ENCOUNTER — Non-Acute Institutional Stay (SKILLED_NURSING_FACILITY): Payer: Medicare Other | Admitting: Adult Health

## 2019-08-16 DIAGNOSIS — J449 Chronic obstructive pulmonary disease, unspecified: Secondary | ICD-10-CM | POA: Diagnosis not present

## 2019-08-16 DIAGNOSIS — I471 Supraventricular tachycardia: Secondary | ICD-10-CM

## 2019-08-16 DIAGNOSIS — E034 Atrophy of thyroid (acquired): Secondary | ICD-10-CM | POA: Diagnosis not present

## 2019-08-16 DIAGNOSIS — I5032 Chronic diastolic (congestive) heart failure: Secondary | ICD-10-CM | POA: Diagnosis not present

## 2019-08-16 DIAGNOSIS — F339 Major depressive disorder, recurrent, unspecified: Secondary | ICD-10-CM

## 2019-08-16 DIAGNOSIS — R251 Tremor, unspecified: Secondary | ICD-10-CM | POA: Diagnosis not present

## 2019-08-16 DIAGNOSIS — K5901 Slow transit constipation: Secondary | ICD-10-CM | POA: Diagnosis not present

## 2019-08-21 DIAGNOSIS — F331 Major depressive disorder, recurrent, moderate: Secondary | ICD-10-CM | POA: Diagnosis not present

## 2019-08-21 DIAGNOSIS — F411 Generalized anxiety disorder: Secondary | ICD-10-CM | POA: Diagnosis not present

## 2019-08-21 DIAGNOSIS — G4700 Insomnia, unspecified: Secondary | ICD-10-CM | POA: Diagnosis not present

## 2019-08-22 ENCOUNTER — Ambulatory Visit: Payer: Medicare Other | Admitting: Neurology

## 2019-08-23 NOTE — Progress Notes (Signed)
Location:  [place] Room Number: 202-R Place of Service:  SNF (31) Provider:  Durenda Age, DNP, FNP-BC  Patient Care Team: [name], MD as PCP - General (Internal Medicine) Medina-Vargas, [name], NP as Nurse Practitioner (Internal Medicine)  Extended Emergency Contact Information Primary Emergency Contact: Hatfield,Lisa Address: [address]          [place], [place] [address] [place] Work Phone: [phone] Mobile Phone: [phone] Relation: Daughter  Code Status:  Full Code  Goals of care: Advanced Directive information Advanced Directives 06/21/2019  Does Patient Have a Medical Advance Directive? Yes  Type of Advance Directive (No Data)  Does patient want to make changes to medical advance directive? No - Patient declined  Would patient like information on creating a medical advance directive? -     Chief Complaint  Patient presents with  . Medical Management of Chronic Issues    Routine [facility] visit    HPI:  Pt is an 79 y.o. female seen today for medical management of chronic diseases.  She is a long-term care resident of [facility].  She has a PMH of COPD, spinal stenosis, depression and anxiety.  She was seen in her room today.  She requested that her senna be given PRN instead of routinely.  She stated that she has been having multiple BMs in a day.  She denies having abdominal pain.  No SOB nor wheezing.  She continues to take Mucinex, Incruse Ellipta, prednisone and PRN albuterol for COPD.  She denies having palpitations.  She takes flecainide, for paroxysmal SVT.  No complaints of chest pains.   Past Medical History:  Diagnosis Date  . Anemia   . Chronic diastolic heart failure ([place])   . Chronic kidney disease, stage 3   . Chronic pain syndrome   . Constipation   . COPD (chronic obstructive pulmonary disease) ([place])   . Dysphagia   . Frequent falls   . Generalized  muscle weakness   . GERD (gastroesophageal reflux disease)   . Headache   . Hip pain, right   . History of anxiety   . History of depression   . Hyperglycemia   . Hypothyroid   . Insomnia   . Metabolic encephalopathy   . Palpitation   . Polyneuropathy   . Spinal stenosis   . Spinal stenosis, cervical region   . Supraventricular tachycardia ([place])   . Thyrotoxicosis, unspecified with thyrotoxic crisis or storm   . Tremor   . Unsteadiness on feet   . UTI (urinary tract infection)    Past Surgical History:  Procedure Laterality Date  . ANKLE FRACTURE SURGERY Left   . CATARACT EXTRACTION, BILATERAL    . FLEXIBLE SIGMOIDOSCOPY Left 11/18/2015   Procedure: FLEXIBLE SIGMOIDOSCOPY;  Surgeon: [name], MD;  Location: [facility];  Service: Endoscopy;  Laterality: Left;  . FLEXIBLE SIGMOIDOSCOPY N/A 11/20/2015   Procedure: FLEXIBLE SIGMOIDOSCOPY;  Surgeon: [name], MD;  Location: [facility];  Service: Endoscopy;  Laterality: N/A;  . HIP ARTHROPLASTY Right 01/12/2016   Procedure: ARTHROPLASTY BIPOLAR HIP (HEMIARTHROPLASTY);  Surgeon: [name], MD;  Location: [facility];  Service: Orthopedics;  Laterality: Right;    Allergies  Allergen Reactions  . Biofreeze [Menthol (Topical Analgesic)] Rash    By history Aspercreme does not cause a rash    Outpatient Encounter Medications as of 08/16/2019  Medication Sig  . acetaminophen (TYLENOL) 325 MG tablet Take 650 mg by mouth every 6 (  six) hours as needed.   Marland Kitchen albuterol (PROVENTIL) (2.5 MG/3ML) 0.083% nebulizer solution Take 2.5 mg by nebulization every 6 (six) hours as needed for wheezing or shortness of breath.  Marland Kitchen alum & mag hydroxide-simeth (MAALOX PLUS) 400-400-40 MG/5ML suspension Give 30cc PO q2H PRN x 24H (call MD if indigestion is not relieved w/i 24H)  . aspirin 81 MG chewable tablet Chew 81 mg by mouth daily.   . calcium carbonate (TUMS - DOSED IN MG ELEMENTAL CALCIUM) 500 MG chewable tablet Chew 2 tablets by mouth every 8 (eight)  hours as needed for indigestion or heartburn.  . digoxin (LANOXIN) 0.125 MG tablet Take 0.125 mg by mouth daily. Hold if HR <60  . DULoxetine (CYMBALTA) 60 MG capsule Take 120 mg by mouth daily. For Depression  . Eyelid Cleansers (STERILID EX) Apply 1 each topically in the morning. Apply to closed eyelids and gently rub it in then wipe it.  . fexofenadine (ALLEGRA) 180 MG tablet Take 180 mg by mouth daily.  . flecainide (TAMBOCOR) 50 MG tablet Take 50 mg by mouth every 12 (twelve) hours.  . fluticasone (FLONASE ALLERGY RELIEF) 50 MCG/ACT nasal spray Place 1 spray into both nostrils daily as needed.   . furosemide (LASIX) 20 MG tablet Take 20 mg by mouth every Monday, Wednesday, and Friday. For CHF   . guaiFENesin (MUCINEX) 600 MG 12 hr tablet Take 600 mg by mouth 2 (two) times daily. COPD cough  . HYDROcodone-acetaminophen (NORCO/VICODIN) 5-325 MG tablet Give **1/2 tablet by mouth q 6 hrs PRN  . levothyroxine (SYNTHROID) 25 MCG tablet Take 25 mcg by mouth daily before breakfast.  . Multiple Vitamins-Minerals (MULTIVITAMIN WITH MINERALS) tablet Take 1 tablet by mouth daily.  . Nutritional Supplement LIQD Take 1 each by mouth 2 (two) times daily. Mighty Shake  . ondansetron (ZOFRAN) 4 MG tablet Take 4 mg by mouth every 8 (eight) hours as needed for nausea.   . OXYGEN Inhale 2 L into the lungs at bedtime.  . pantoprazole (PROTONIX) 40 MG tablet Take 40 mg by mouth daily.  . polyethylene glycol (MIRALAX / GLYCOLAX) packet Take 17 g by mouth daily.  . polyvinyl alcohol (ARTIFICIAL TEARS) 1.4 % ophthalmic solution Place 2 drops into both eyes in the morning, at noon, in the evening, and at bedtime. For dry eyes   . predniSONE (DELTASONE) 5 MG tablet TAKE 1 TABLET BY MOUTH DAILY (WITH BREAKFAST) SW:HQPR  . primidone (MYSOLINE) 50 MG tablet Take 100 mg by mouth at bedtime.   . propranolol ER (INDERAL LA) 60 MG 24 hr capsule Take 1 capsule (60 mg total) by mouth daily.  . sennosides-docusate sodium  (SENOKOT-S) 8.6-50 MG tablet Take 1 tablet by mouth 2 (two) times daily.   . traZODone (DESYREL) 50 MG tablet Take 1/2 tab =25 mg by mouth at bedtime  . traZODone (DESYREL) 50 MG tablet Take 12.5 mg by mouth 2 (two) times daily. 1/4 tablet to = 12.5 mg  . umeclidinium bromide (INCRUSE ELLIPTA) 62.5 MCG/INH AEPB Inhale 1 puff into the lungs daily. (SHAKE WELL)   No facility-administered encounter medications on file as of 08/16/2019.    Review of Systems  GENERAL: No change in appetite, no fatigue, no weight changes, no fever, chills or weakness MOUTH and THROAT: Denies oral discomfort, gingival pain or bleeding RESPIRATORY: no cough, SOB, DOE, wheezing, hemoptysis CARDIAC: No chest pain, edema or palpitations GI: No abdominal pain constipation, heart burn, nausea or vomiting GU: Denies dysuria, frequency, hematuria, incontinence, or  discharge NEUROLOGICAL: Denies dizziness, syncope, numbness, or headache PSYCHIATRIC: Denies feelings of depression or anxiety. No report of hallucinations, insomnia, paranoia, or agitation   Immunization History  Administered Date(s) Administered  . Influenza, High Dose Seasonal PF 10/04/2016  . Influenza,inj,Quad PF,6+ Mos 01/13/2016  . Influenza-Unspecified 11/10/2017, 10/09/2018  . Moderna SARS-COVID-2 Vaccination 01/28/2019, 02/25/2019  . Pneumococcal-Unspecified 10/11/2011  . Tdap 01/23/2016   Pertinent  Health Maintenance Due  Topic Date Due  . INFLUENZA VACCINE  08/11/2019  . PNA vac Low Risk Adult  Completed  . DEXA SCAN  Discontinued   Fall Risk  10/24/2018 07/19/2018 01/17/2018 11/09/2017 07/14/2017  Falls in the past year? 0 0 0 No No  Number falls in past yr: 0 0 0 - -  Injury with Fall? 0 0 0 - -  Comment - - - - -  Risk Factor Category  - - - - -  Follow up - - Falls evaluation completed - -     Vitals:   08/16/19 1644  BP: 134/71  Pulse: 68  Resp: 18  Temp: (!) 97.3 F (36.3 C)  TempSrc: Oral  SpO2: 100%  Weight: 131 lb 12.8  oz (59.8 kg)  Height: 5\' 8"  (1.727 m)   Body mass index is 20.04 kg/m.  Physical Exam  GENERAL APPEARANCE:  In no acute distress.  SKIN:  Skin is warm and dry.  MOUTH and THROAT: Lips are without lesions. Oral mucosa is moist and without lesions. Tongue is normal in shape, size, and color and without lesions RESPIRATORY: Breathing is even & unlabored, BS CTAB CARDIAC: RRR, no murmur,no extra heart sounds, no edema GI: Abdomen soft, normal BS, no masses, no tenderness EXTREMITIES:  Able to move X 4 extremities NEUROLOGICAL: + tremor. Speech is clear. PSYCHIATRIC: Alert and oriented X 3. Affect and behavior are appropriate  Labs reviewed: Recent Labs    01/05/19 0000 06/14/19 0000 07/02/19 0000  NA 135* 128* 135*  K 4.6 4.5 4.4  CL 96* 87* 96*  CO2 23* 28* 30*  BUN 12 17 10   CREATININE 0.9 1.0 0.9  CALCIUM 9.2 9.4 9.3   No results for input(s): AST, ALT, ALKPHOS, BILITOT, PROT, ALBUMIN in the last 8760 hours. Recent Labs    11/23/18 0000 01/09/19 0000 06/14/19 0000  WBC 4.4 5.0 7.3  NEUTROABS  --   --  6  HGB 10.3* 9.7* 9.9*  HCT 30* 29* 29*  PLT 225 236 276   Lab Results  Component Value Date   TSH 3.40 07/11/2019   Lab Results  Component Value Date   HGBA1C 5.2 12/01/2015   Lab Results  Component Value Date   CHOL 107 12/01/2015   HDL 27 (L) 12/01/2015   LDLCALC 59 12/01/2015   TRIG 107 12/01/2015   CHOLHDL 4.0 12/01/2015    Significant Diagnostic Results in last 30 days:  MR BRAIN WO CONTRAST  Result Date: 07/24/2019 CLINICAL DATA:  Headache EXAM: MRI HEAD WITHOUT CONTRAST TECHNIQUE: Multiplanar, multiecho pulse sequences of the brain and surrounding structures were obtained without intravenous contrast. COMPARISON:  10/04/2016 head CT.  12/01/2015 MRI head. FINDINGS: Brain: Scattered and confluent T2/FLAIR hyperintense foci involving the periventricular and subcortical white matter. No acute infarct or intracranial hemorrhage. No midline shift or  extra-axial fluid collection. Mild diffuse parenchymal volume loss with ex vacuo dilatation. No mass lesion. Vascular: Normal flow voids. Skull and upper cervical spine: Normal marrow signal. Reversal of cervical lordosis with grade 1 C2-3 and C3-4 anterolisthesis. Grade 1  C4-5 retrolisthesis. Sinuses/Orbits: [place]ela of bilateral lens replacement. Left maxillary sinus mucosal thickening. No mastoid effusion. Other: Increased size of 1.9 x 1.0 cm dorsal midline neck cystic lesion (2:12). IMPRESSION: No acute intracranial process. Mild cerebral atrophy. Interval progression of chronic microvascular ischemic changes. Upper cervical spondylosis with reversal of lordosis. Increased size of 1.9 cm dorsal midline neck cystic lesion, likely a sebaceous cyst. Electronically Signed   By: Primitivo Gauze M.D.   On: 07/24/2019 14:31    Assessment/Plan 1. Hypothyroidism due to acquired atrophy of thyroid Lab Results  Component Value Date   TSH 3.40 07/11/2019   -Continue levothyroxine  2. Paroxysmal SVT (supraventricular tachycardia) (HCC) -Rate controlled, continue flecainide and propranolol  3. Chronic diastolic CHF (congestive heart failure) (HCC) -Stable, continue digoxin and furosemide  4. Chronic obstructive pulmonary disease, unspecified COPD type ([place]) -No wheezing, stable -Continue Incruse Ellipta, prednisone, Mucinex and PRN albuterol  5. Tremor -Stable, continue propranolol and primidone  6. Depression, recurrent (HCC) -Mood is stable, continue duloxetine  7.  Slow transit constipation - will change Senna to PRN and continue MiraLAX daily    Family/ staff Communication: Discussed plan of care with resident and charge nurse.  Labs/tests ordered: None  Goals of care:   Long-term care   Durenda Age, DNP, MSN, FNP-BC [facility] [phone] (Monday-Friday 8:00 a.m. - 5:00 p.m.) [phone] (after hours)

## 2019-08-28 DIAGNOSIS — G4700 Insomnia, unspecified: Secondary | ICD-10-CM | POA: Diagnosis not present

## 2019-08-28 DIAGNOSIS — F331 Major depressive disorder, recurrent, moderate: Secondary | ICD-10-CM | POA: Diagnosis not present

## 2019-08-28 DIAGNOSIS — F411 Generalized anxiety disorder: Secondary | ICD-10-CM | POA: Diagnosis not present

## 2019-09-04 DIAGNOSIS — G4700 Insomnia, unspecified: Secondary | ICD-10-CM | POA: Diagnosis not present

## 2019-09-04 DIAGNOSIS — F331 Major depressive disorder, recurrent, moderate: Secondary | ICD-10-CM | POA: Diagnosis not present

## 2019-09-04 DIAGNOSIS — F411 Generalized anxiety disorder: Secondary | ICD-10-CM | POA: Diagnosis not present

## 2019-09-05 ENCOUNTER — Other Ambulatory Visit: Payer: Self-pay | Admitting: Internal Medicine

## 2019-09-05 DIAGNOSIS — G894 Chronic pain syndrome: Secondary | ICD-10-CM

## 2019-09-05 MED ORDER — HYDROCODONE-ACETAMINOPHEN 5-325 MG PO TABS: ORAL_TABLET | ORAL | 0 refills | Status: DC

## 2019-09-10 DIAGNOSIS — M2550 Pain in unspecified joint: Secondary | ICD-10-CM | POA: Diagnosis not present

## 2019-09-10 DIAGNOSIS — I5032 Chronic diastolic (congestive) heart failure: Secondary | ICD-10-CM | POA: Diagnosis not present

## 2019-09-10 DIAGNOSIS — M4802 Spinal stenosis, cervical region: Secondary | ICD-10-CM | POA: Diagnosis not present

## 2019-09-11 DIAGNOSIS — F411 Generalized anxiety disorder: Secondary | ICD-10-CM | POA: Diagnosis not present

## 2019-09-11 DIAGNOSIS — G4700 Insomnia, unspecified: Secondary | ICD-10-CM | POA: Diagnosis not present

## 2019-09-11 DIAGNOSIS — F331 Major depressive disorder, recurrent, moderate: Secondary | ICD-10-CM | POA: Diagnosis not present

## 2019-09-12 DIAGNOSIS — M4802 Spinal stenosis, cervical region: Secondary | ICD-10-CM | POA: Diagnosis not present

## 2019-09-12 DIAGNOSIS — M2550 Pain in unspecified joint: Secondary | ICD-10-CM | POA: Diagnosis not present

## 2019-09-13 DIAGNOSIS — M2550 Pain in unspecified joint: Secondary | ICD-10-CM | POA: Diagnosis not present

## 2019-09-13 DIAGNOSIS — M4802 Spinal stenosis, cervical region: Secondary | ICD-10-CM | POA: Diagnosis not present

## 2019-09-16 DIAGNOSIS — M4802 Spinal stenosis, cervical region: Secondary | ICD-10-CM | POA: Diagnosis not present

## 2019-09-16 DIAGNOSIS — M2550 Pain in unspecified joint: Secondary | ICD-10-CM | POA: Diagnosis not present

## 2019-09-17 DIAGNOSIS — M2550 Pain in unspecified joint: Secondary | ICD-10-CM | POA: Diagnosis not present

## 2019-09-17 DIAGNOSIS — M4802 Spinal stenosis, cervical region: Secondary | ICD-10-CM | POA: Diagnosis not present

## 2019-09-18 ENCOUNTER — Non-Acute Institutional Stay (SKILLED_NURSING_FACILITY): Payer: Medicare Other | Admitting: Adult Health

## 2019-09-18 ENCOUNTER — Encounter: Payer: Self-pay | Admitting: Adult Health

## 2019-09-18 DIAGNOSIS — M2550 Pain in unspecified joint: Secondary | ICD-10-CM | POA: Diagnosis not present

## 2019-09-18 DIAGNOSIS — R251 Tremor, unspecified: Secondary | ICD-10-CM | POA: Diagnosis not present

## 2019-09-18 DIAGNOSIS — E034 Atrophy of thyroid (acquired): Secondary | ICD-10-CM | POA: Diagnosis not present

## 2019-09-18 DIAGNOSIS — F411 Generalized anxiety disorder: Secondary | ICD-10-CM | POA: Diagnosis not present

## 2019-09-18 DIAGNOSIS — F331 Major depressive disorder, recurrent, moderate: Secondary | ICD-10-CM | POA: Diagnosis not present

## 2019-09-18 DIAGNOSIS — J449 Chronic obstructive pulmonary disease, unspecified: Secondary | ICD-10-CM | POA: Diagnosis not present

## 2019-09-18 DIAGNOSIS — M4802 Spinal stenosis, cervical region: Secondary | ICD-10-CM | POA: Diagnosis not present

## 2019-09-18 DIAGNOSIS — G4700 Insomnia, unspecified: Secondary | ICD-10-CM | POA: Diagnosis not present

## 2019-09-18 DIAGNOSIS — F339 Major depressive disorder, recurrent, unspecified: Secondary | ICD-10-CM | POA: Diagnosis not present

## 2019-09-18 DIAGNOSIS — I5032 Chronic diastolic (congestive) heart failure: Secondary | ICD-10-CM | POA: Diagnosis not present

## 2019-09-18 DIAGNOSIS — I471 Supraventricular tachycardia: Secondary | ICD-10-CM | POA: Diagnosis not present

## 2019-09-18 NOTE — Progress Notes (Signed)
Location:  [place] Room Number: 322-G Place of Service:  SNF (31) Provider:  Durenda Age, DNP, FNP-BC  Patient Care Team: [name], MD as PCP - General (Internal Medicine) Medina-Vargas, [name], NP as Nurse Practitioner (Internal Medicine)  Extended Emergency Contact Information Primary Emergency Contact: Hatfield,Lisa Address: [address]          [place], [place] [address] [place] Work Phone: [phone] Mobile Phone: [phone] Relation: Daughter  Code Status:  FULL CODE  Goals of care: Advanced Directive information Advanced Directives 06/21/2019  Does Patient Have a Medical Advance Directive? Yes  Type of Advance Directive (No Data)  Does patient want to make changes to medical advance directive? No - Patient declined  Would patient like information on creating a medical advance directive? -     Chief Complaint  Patient presents with   Medical Management of Chronic Issues    Routine [facility] visit    HPI:  Pt is aN 79 y.o. female seen today for medical management of chronic diseases. She is a long-term care resident of [facility]. She has a PMH of COPD, spinal stenosis, depression and anxiety. She was seen in her room today. HRs ranging from 60 to 79. She takes Flecainide and Digoxin for SVT. Latest weight 131.6 lbs, stable. She denies SOB today. She takes Furosemide for chronic diastolic heart failure.   Past Medical History:  Diagnosis Date   Anemia    Chronic diastolic heart failure (HCC)    Chronic kidney disease, stage 3    Chronic pain syndrome    Constipation    COPD (chronic obstructive pulmonary disease) (HCC)    Dysphagia    Frequent falls    Generalized muscle weakness    GERD (gastroesophageal reflux disease)    Headache    Hip pain, right    History of anxiety    History of depression    Hyperglycemia    Hypothyroid    Insomnia     Metabolic encephalopathy    Palpitation    Polyneuropathy    Spinal stenosis    Spinal stenosis, cervical region    Supraventricular tachycardia ([place])    Thyrotoxicosis, unspecified with thyrotoxic crisis or storm    Tremor    Unsteadiness on feet    UTI (urinary tract infection)    Past Surgical History:  Procedure Laterality Date   ANKLE FRACTURE SURGERY Left    CATARACT EXTRACTION, BILATERAL     FLEXIBLE SIGMOIDOSCOPY Left 11/18/2015   Procedure: FLEXIBLE SIGMOIDOSCOPY;  Surgeon: [name], MD;  Location: [place];  Service: Endoscopy;  Laterality: Left;   FLEXIBLE SIGMOIDOSCOPY N/A 11/20/2015   Procedure: FLEXIBLE SIGMOIDOSCOPY;  Surgeon: [name], MD;  Location: [facility] ENDOSCOPY;  Service: Endoscopy;  Laterality: N/A;   HIP ARTHROPLASTY Right 01/12/2016   Procedure: ARTHROPLASTY BIPOLAR HIP (HEMIARTHROPLASTY);  Surgeon: [name], MD;  Location: [facility];  Service: Orthopedics;  Laterality: Right;    Allergies  Allergen Reactions   Biofreeze [Menthol (Topical Analgesic)] Rash    By history Aspercreme does not cause a rash    Outpatient Encounter Medications as of 09/18/2019  Medication Sig   acetaminophen (TYLENOL) 325 MG tablet Take 650 mg by mouth every 6 (six) hours as needed.    albuterol (PROVENTIL) (2.5 MG/3ML) 0.083% nebulizer solution Take 2.5 mg by nebulization every 6 (six) hours as needed for wheezing or shortness of breath.   alum & mag hydroxide-simeth (MAALOX PLUS) 400-400-40 MG/5ML  suspension Give 30cc PO q2H PRN x 24H (call MD if indigestion is not relieved w/i 24H)   aspirin 81 MG chewable tablet Chew 81 mg by mouth daily.    calcium carbonate (TUMS - DOSED IN MG ELEMENTAL CALCIUM) 500 MG chewable tablet Chew 2 tablets by mouth every 8 (eight) hours as needed for indigestion or heartburn.   digoxin (LANOXIN) 0.125 MG tablet Take 0.125 mg by mouth daily. Hold if HR <60   DULoxetine (CYMBALTA) 60 MG capsule Take 120 mg by mouth daily. For  Depression   Eyelid Cleansers (STERILID EX) Apply 1 each topically in the morning. Apply to closed eyelids and gently rub it in then wipe it.   fexofenadine (ALLEGRA) 180 MG tablet Take 180 mg by mouth daily.   flecainide (TAMBOCOR) 50 MG tablet Take 50 mg by mouth every 12 (twelve) hours.   fluticasone (FLONASE ALLERGY RELIEF) 50 MCG/ACT nasal spray Place 1 spray into both nostrils daily as needed.    furosemide (LASIX) 20 MG tablet Take 20 mg by mouth every Monday, Wednesday, and Friday. For CHF    guaiFENesin (MUCINEX) 600 MG 12 hr tablet Take 600 mg by mouth 2 (two) times daily. COPD cough   HYDROcodone-acetaminophen (NORCO/VICODIN) 5-325 MG tablet Give **1/2 tablet by mouth q 6 hrs PRN   levothyroxine (SYNTHROID) 25 MCG tablet Take 25 mcg by mouth daily before breakfast.   Multiple Vitamins-Minerals (MULTIVITAMIN WITH MINERALS) tablet Take 1 tablet by mouth daily.   Nutritional Supplement LIQD Take 1 each by mouth 2 (two) times daily. Mighty Shake   ondansetron (ZOFRAN) 4 MG tablet Take 4 mg by mouth every 8 (eight) hours as needed for nausea.    OXYGEN Inhale 2 L into the lungs at bedtime.   pantoprazole (PROTONIX) 40 MG tablet Take 40 mg by mouth daily.   polyethylene glycol (MIRALAX / GLYCOLAX) packet Take 17 g by mouth daily.   polyvinyl alcohol (ARTIFICIAL TEARS) 1.4 % ophthalmic solution Place 2 drops into both eyes in the morning, at noon, in the evening, and at bedtime. For dry eyes    predniSONE (DELTASONE) 5 MG tablet TAKE 1 TABLET BY MOUTH DAILY (WITH BREAKFAST) KW:IOXB   primidone (MYSOLINE) 50 MG tablet Take 100 mg by mouth at bedtime.    propranolol ER (INDERAL LA) 60 MG 24 hr capsule Take 1 capsule (60 mg total) by mouth daily.   sennosides-docusate sodium (SENOKOT-S) 8.6-50 MG tablet Take 1 tablet by mouth 2 (two) times daily.    traZODone (DESYREL) 50 MG tablet Take 1/2 tab =25 mg by mouth at bedtime   traZODone (DESYREL) 50 MG tablet Take 12.5 mg by  mouth 2 (two) times daily. 1/4 tablet to = 12.5 mg   umeclidinium bromide (INCRUSE ELLIPTA) 62.5 MCG/INH AEPB Inhale 1 puff into the lungs daily. (SHAKE WELL)   No facility-administered encounter medications on file as of 09/18/2019.    Review of Systems  GENERAL: No change in appetite, no fatigue, no weight changes, no fever, chills or weakness MOUTH and THROAT: Denies oral discomfort, gingival pain or bleeding RESPIRATORY: no cough, SOB, DOE, wheezing, hemoptysis CARDIAC: No chest pain, edema or palpitations GI: No abdominal pain, diarrhea, constipation, heart burn, nausea or vomiting GU: Denies dysuria, frequency, hematuria, incontinence, or discharge NEUROLOGICAL: Denies dizziness, syncope, numbness, or headache PSYCHIATRIC: Denies feelings of depression or anxiety. No report of hallucinations, insomnia, paranoia, or agitation   Immunization History  Administered Date(s) Administered   Influenza, High Dose Seasonal PF 10/04/2016  Influenza,inj,Quad PF,6+ Mos 01/13/2016   Influenza-Unspecified 11/10/2017, 10/09/2018   Moderna SARS-COVID-2 Vaccination 01/28/2019, 02/25/2019   Pneumococcal-Unspecified 10/11/2011   Tdap 01/23/2016   Pertinent  Health Maintenance Due  Topic Date Due   INFLUENZA VACCINE  08/11/2019   PNA vac Low Risk Adult  Completed   DEXA SCAN  Discontinued   Fall Risk  10/24/2018 07/19/2018 01/17/2018 11/09/2017 07/14/2017  Falls in the past year? 0 0 0 No No  Number falls in past yr: 0 0 0 - -  Injury with Fall? 0 0 0 - -  Comment - - - - -  Risk Factor Category  - - - - -  Follow up - - Falls evaluation completed - -     Vitals:   09/18/19 1025  BP: 118/68  Pulse: 79  Resp: 18  Temp: 98.2 F (36.8 C)  TempSrc: Oral  SpO2: 94%  Weight: 131 lb 9.6 oz (59.7 kg)  Height: 5\' 8"  (1.727 m)   Body mass index is 20.01 kg/m.  Physical Exam  GENERAL APPEARANCE: Well nourished. In no acute distress. Normal body habitus SKIN:  Skin is warm and  dry.  MOUTH and THROAT: Lips are without lesions. Oral mucosa is moist and without lesions. Tongue is normal in shape, size, and color and without lesions RESPIRATORY: Breathing is even & unlabored, BS CTAB CARDIAC: RRR, no murmur,no extra heart sounds, no edema GI: Abdomen soft, normal BS, no masses, no tenderness, EXTREMITIES:  Able to move X 4 extremities NEUROLOGICAL: + tremor. Speech is clear. Alert and oriented X 3.  PSYCHIATRIC: Affect and behavior are appropriate  Labs reviewed: Recent Labs    01/05/19 0000 06/14/19 0000 07/02/19 0000  NA 135* 128* 135*  K 4.6 4.5 4.4  CL 96* 87* 96*  CO2 23* 28* 30*  BUN 12 17 10   CREATININE 0.9 1.0 0.9  CALCIUM 9.2 9.4 9.3    Recent Labs    11/23/18 0000 01/09/19 0000 06/14/19 0000  WBC 4.4 5.0 7.3  NEUTROABS  --   --  6  HGB 10.3* 9.7* 9.9*  HCT 30* 29* 29*  PLT 225 236 276   Lab Results  Component Value Date   TSH 3.40 07/11/2019   Lab Results  Component Value Date   HGBA1C 5.2 12/01/2015   Lab Results  Component Value Date   CHOL 107 12/01/2015   HDL 27 (L) 12/01/2015   LDLCALC 59 12/01/2015   TRIG 107 12/01/2015   CHOLHDL 4.0 12/01/2015    Assessment/Plan  1. Chronic diastolic CHF (congestive heart failure) (HCC) -  No SOB nor edema, continue furosemide  2. Paroxysmal SVT (supraventricular tachycardia) (HCC) -Rate controlled, continue digoxin, propanolol and flecainide  3. Hypothyroidism due to acquired atrophy of thyroid Lab Results  Component Value Date   TSH 3.40 07/11/2019   -Continue levothyroxine  4. Tremor -Stable, continue propranolol and primidone  5. Chronic obstructive pulmonary disease, unspecified COPD type ([place]) -Stable, continue Mucinex, prednisone, Incruse and PRN albuterol  6. Depression, recurrent ([place]) -  Participates in facility activities, mood is stable -  Continue duloxetine -   Followed up by psych NP   Family/ staff Communication:   Discussed plan of care with  resident and charge nurse.  Labs/tests ordered: None  Goals of care:   Long-term care  Durenda Age, DNP, MSN, FNP-BC [facility] [phone] (Monday-Friday 8:00 a.m. - 5:00 p.m.) [phone] (after hours)

## 2019-09-19 DIAGNOSIS — M2550 Pain in unspecified joint: Secondary | ICD-10-CM | POA: Diagnosis not present

## 2019-09-19 DIAGNOSIS — M4802 Spinal stenosis, cervical region: Secondary | ICD-10-CM | POA: Diagnosis not present

## 2019-09-20 DIAGNOSIS — M2550 Pain in unspecified joint: Secondary | ICD-10-CM | POA: Diagnosis not present

## 2019-09-20 DIAGNOSIS — M4802 Spinal stenosis, cervical region: Secondary | ICD-10-CM | POA: Diagnosis not present

## 2019-09-23 DIAGNOSIS — M4802 Spinal stenosis, cervical region: Secondary | ICD-10-CM | POA: Diagnosis not present

## 2019-09-23 DIAGNOSIS — M2550 Pain in unspecified joint: Secondary | ICD-10-CM | POA: Diagnosis not present

## 2019-09-24 DIAGNOSIS — M2550 Pain in unspecified joint: Secondary | ICD-10-CM | POA: Diagnosis not present

## 2019-09-24 DIAGNOSIS — M4802 Spinal stenosis, cervical region: Secondary | ICD-10-CM | POA: Diagnosis not present

## 2019-09-25 DIAGNOSIS — M2550 Pain in unspecified joint: Secondary | ICD-10-CM | POA: Diagnosis not present

## 2019-09-25 DIAGNOSIS — M4802 Spinal stenosis, cervical region: Secondary | ICD-10-CM | POA: Diagnosis not present

## 2019-09-26 DIAGNOSIS — M4802 Spinal stenosis, cervical region: Secondary | ICD-10-CM | POA: Diagnosis not present

## 2019-09-26 DIAGNOSIS — M2550 Pain in unspecified joint: Secondary | ICD-10-CM | POA: Diagnosis not present

## 2019-09-27 DIAGNOSIS — M2550 Pain in unspecified joint: Secondary | ICD-10-CM | POA: Diagnosis not present

## 2019-09-27 DIAGNOSIS — M4802 Spinal stenosis, cervical region: Secondary | ICD-10-CM | POA: Diagnosis not present

## 2019-09-30 DIAGNOSIS — M2550 Pain in unspecified joint: Secondary | ICD-10-CM | POA: Diagnosis not present

## 2019-09-30 DIAGNOSIS — M4802 Spinal stenosis, cervical region: Secondary | ICD-10-CM | POA: Diagnosis not present

## 2019-10-01 DIAGNOSIS — M2550 Pain in unspecified joint: Secondary | ICD-10-CM | POA: Diagnosis not present

## 2019-10-01 DIAGNOSIS — M4802 Spinal stenosis, cervical region: Secondary | ICD-10-CM | POA: Diagnosis not present

## 2019-10-03 DIAGNOSIS — M4802 Spinal stenosis, cervical region: Secondary | ICD-10-CM | POA: Diagnosis not present

## 2019-10-03 DIAGNOSIS — M2550 Pain in unspecified joint: Secondary | ICD-10-CM | POA: Diagnosis not present

## 2019-10-04 DIAGNOSIS — M4802 Spinal stenosis, cervical region: Secondary | ICD-10-CM | POA: Diagnosis not present

## 2019-10-04 DIAGNOSIS — M2550 Pain in unspecified joint: Secondary | ICD-10-CM | POA: Diagnosis not present

## 2019-10-07 DIAGNOSIS — M2550 Pain in unspecified joint: Secondary | ICD-10-CM | POA: Diagnosis not present

## 2019-10-07 DIAGNOSIS — M4802 Spinal stenosis, cervical region: Secondary | ICD-10-CM | POA: Diagnosis not present

## 2019-10-09 ENCOUNTER — Non-Acute Institutional Stay (SKILLED_NURSING_FACILITY): Payer: Medicare Other | Admitting: Adult Health

## 2019-10-09 ENCOUNTER — Encounter: Payer: Self-pay | Admitting: Adult Health

## 2019-10-09 DIAGNOSIS — E034 Atrophy of thyroid (acquired): Secondary | ICD-10-CM

## 2019-10-09 DIAGNOSIS — Z7189 Other specified counseling: Secondary | ICD-10-CM | POA: Diagnosis not present

## 2019-10-09 DIAGNOSIS — I5032 Chronic diastolic (congestive) heart failure: Secondary | ICD-10-CM | POA: Diagnosis not present

## 2019-10-09 DIAGNOSIS — J449 Chronic obstructive pulmonary disease, unspecified: Secondary | ICD-10-CM

## 2019-10-09 DIAGNOSIS — I471 Supraventricular tachycardia: Secondary | ICD-10-CM

## 2019-10-09 NOTE — Progress Notes (Signed)
Location:  [place] Room Number: 202 A Place of Service:  SNF (31) Provider:  Durenda Age, DNP, FNP-BC  Patient Care Team: [name], MD as PCP - General (Internal Medicine) Medina-Vargas, [name], NP as Nurse Practitioner (Internal Medicine)  Extended Emergency Contact Information Primary Emergency Contact: Hatfield,Lisa Address: [address]          [place], [place] [address] [place] Work Phone: [phone] Mobile Phone: [phone] Relation: Daughter  Code Status:  FULL CODE  Goals of care: Advanced Directive information Advanced Directives 06/21/2019  Does Patient Have a Medical Advance Directive? Yes  Type of Advance Directive (No Data)  Does patient want to make changes to medical advance directive? No - Patient declined  Would patient like information on creating a medical advance directive? -     Chief Complaint  Patient presents with  . advance care planning    care plan meeting    HPI:  Pt is aN 79 y.o. female seen today for care plan meeting.  She is a long-term care resident of [facility].  She has a PMH of COPD, spinal stenosis, depression and anxiety. Advance care planning was attended by MDS coordinator, NP, social worker and Glass blower/designer. Resident was invited to the meeting but declined. Daughter, Audreena Sachdeva, was called via teleconference but did not answer. She remains to be full code. Discussed medications, vital signs and weights. She refused to be seen by podiatrist yesterday. Activity director discussed goal of increasing participation in facility activities.  She likes to drink coffee and states by the nurses station. The meeting lasted for 25 minutes.                               Past Medical History:  Diagnosis Date  . Anemia   . Chronic diastolic heart failure ([place])   . Chronic kidney disease, stage 3   . Chronic pain syndrome   . Constipation   . COPD  (chronic obstructive pulmonary disease) ([place])   . Dysphagia   . Frequent falls   . Generalized muscle weakness   . GERD (gastroesophageal reflux disease)   . Headache   . Hip pain, right   . History of anxiety   . History of depression   . Hyperglycemia   . Hypothyroid   . Insomnia   . Metabolic encephalopathy   . Palpitation   . Polyneuropathy   . Spinal stenosis   . Spinal stenosis, cervical region   . Supraventricular tachycardia ([place])   . Thyrotoxicosis, unspecified with thyrotoxic crisis or storm   . Tremor   . Unsteadiness on feet   . UTI (urinary tract infection)    Past Surgical History:  Procedure Laterality Date  . ANKLE FRACTURE SURGERY Left   . CATARACT EXTRACTION, BILATERAL    . FLEXIBLE SIGMOIDOSCOPY Left 11/18/2015   Procedure: FLEXIBLE SIGMOIDOSCOPY;  Surgeon: [name], MD;  Location: [place];  Service: Endoscopy;  Laterality: Left;  . FLEXIBLE SIGMOIDOSCOPY N/A 11/20/2015   Procedure: FLEXIBLE SIGMOIDOSCOPY;  Surgeon: [name], MD;  Location: [facility] ENDOSCOPY;  Service: Endoscopy;  Laterality: N/A;  . HIP ARTHROPLASTY Right 01/12/2016   Procedure: ARTHROPLASTY BIPOLAR HIP (HEMIARTHROPLASTY);  Surgeon: [name], MD;  Location: [facility];  Service: Orthopedics;  Laterality: Right;    Allergies  Allergen Reactions  . Biofreeze [Menthol (Topical Analgesic)] Rash    By history Aspercreme does not  cause a rash    Outpatient Encounter Medications as of 10/09/2019  Medication Sig  . acetaminophen (TYLENOL) 325 MG tablet Take 650 mg by mouth every 6 (six) hours as needed.   Marland Kitchen albuterol (PROVENTIL) (2.5 MG/3ML) 0.083% nebulizer solution Take 2.5 mg by nebulization every 6 (six) hours as needed for wheezing or shortness of breath.  Marland Kitchen alum & mag hydroxide-simeth (MAALOX PLUS) 400-400-40 MG/5ML suspension Give 30cc PO q2H PRN x 24H (call MD if indigestion is not relieved w/i 24H)  . aspirin 81 MG chewable tablet Chew 81 mg by mouth daily.   . calcium carbonate  (TUMS - DOSED IN MG ELEMENTAL CALCIUM) 500 MG chewable tablet Chew 2 tablets by mouth every 8 (eight) hours as needed for indigestion or heartburn.  . digoxin (LANOXIN) 0.125 MG tablet Take 0.125 mg by mouth daily. Hold if HR <60  . DULoxetine (CYMBALTA) 60 MG capsule Take 120 mg by mouth daily. For Depression  . Eyelid Cleansers (STERILID EX) Apply 1 each topically in the morning. Apply to closed eyelids and gently rub it in then wipe it.  . fexofenadine (ALLEGRA) 180 MG tablet Take 180 mg by mouth daily.  . flecainide (TAMBOCOR) 50 MG tablet Take 50 mg by mouth every 12 (twelve) hours.  . fluticasone (FLONASE ALLERGY RELIEF) 50 MCG/ACT nasal spray Place 1 spray into both nostrils daily as needed.   . furosemide (LASIX) 20 MG tablet Take 20 mg by mouth every Monday, Wednesday, and Friday. For CHF   . guaiFENesin (MUCINEX) 600 MG 12 hr tablet Take 600 mg by mouth 2 (two) times daily. COPD cough  . HYDROcodone-acetaminophen (NORCO/VICODIN) 5-325 MG tablet Give **1/2 tablet by mouth q 6 hrs PRN  . levothyroxine (SYNTHROID) 25 MCG tablet Take 25 mcg by mouth daily before breakfast.  . Multiple Vitamins-Minerals (MULTIVITAMIN WITH MINERALS) tablet Take 1 tablet by mouth daily.  . Nutritional Supplement LIQD Take 1 each by mouth 2 (two) times daily. Mighty Shake  . ondansetron (ZOFRAN) 4 MG tablet Take 4 mg by mouth every 8 (eight) hours as needed for nausea.   . OXYGEN Inhale 2 L into the lungs at bedtime.  . pantoprazole (PROTONIX) 40 MG tablet Take 40 mg by mouth daily.  . polyethylene glycol (MIRALAX / GLYCOLAX) packet Take 17 g by mouth daily.  . polyvinyl alcohol (ARTIFICIAL TEARS) 1.4 % ophthalmic solution Place 2 drops into both eyes in the morning, at noon, in the evening, and at bedtime. For dry eyes   . predniSONE (DELTASONE) 5 MG tablet TAKE 1 TABLET BY MOUTH DAILY (WITH BREAKFAST) JS:RPRX  . primidone (MYSOLINE) 50 MG tablet Take 100 mg by mouth at bedtime.   . propranolol ER (INDERAL LA)  60 MG 24 hr capsule Take 1 capsule (60 mg total) by mouth daily.  . sennosides-docusate sodium (SENOKOT-S) 8.6-50 MG tablet Take 1 tablet by mouth 2 (two) times daily.   . traZODone (DESYREL) 50 MG tablet Take 1/2 tab =25 mg by mouth at bedtime  . traZODone (DESYREL) 50 MG tablet Take 12.5 mg by mouth 2 (two) times daily. 1/4 tablet to = 12.5 mg  . umeclidinium bromide (INCRUSE ELLIPTA) 62.5 MCG/INH AEPB Inhale 1 puff into the lungs daily. (SHAKE WELL)   No facility-administered encounter medications on file as of 10/09/2019.    Review of Systems  GENERAL: No change in appetite, no fatigue, no weight changes, no fever, chills or weakness MOUTH and THROAT: Denies oral discomfort, gingival pain or bleeding RESPIRATORY: no  cough, SOB, DOE, wheezing, hemoptysis CARDIAC: No chest pain, edema or palpitations GI: No abdominal pain, diarrhea, constipation, heart burn, nausea or vomiting GU: Denies dysuria, frequency, hematuria, incontinence, or discharge NEUROLOGICAL: Denies dizziness, syncope, numbness, or headache PSYCHIATRIC: Denies feelings of depression or anxiety. No report of hallucinations, insomnia, paranoia, or agitation   Immunization History  Administered Date(s) Administered  . Influenza, High Dose Seasonal PF 10/04/2016  . Influenza,inj,Quad PF,6+ Mos 01/13/2016  . Influenza-Unspecified 11/10/2017, 10/09/2018  . Moderna SARS-COVID-2 Vaccination 01/28/2019, 02/25/2019  . Pneumococcal-Unspecified 10/11/2011  . Tdap 01/23/2016   Pertinent  Health Maintenance Due  Topic Date Due  . INFLUENZA VACCINE  04/09/2020 (Originally 08/11/2019)  . PNA vac Low Risk Adult  Completed  . DEXA SCAN  Discontinued   Fall Risk  10/24/2018 07/19/2018 01/17/2018 11/09/2017 07/14/2017  Falls in the past year? 0 0 0 No No  Number falls in past yr: 0 0 0 - -  Injury with Fall? 0 0 0 - -  Comment - - - - -  Risk Factor Category  - - - - -  Follow up - - Falls evaluation completed - -     Vitals:    10/09/19 1100  BP: 134/70  Pulse: 78  Resp: 18  Temp: 98.2 F (36.8 C)  Weight: 131 lb 9.6 oz (59.7 kg)  Height: 5\' 8"  (1.727 m)   Body mass index is 20.01 kg/m.  Physical Exam  GENERAL APPEARANCE: Well nourished. In no acute distress. Normal body habitus SKIN:  Skin is warm and dry.  MOUTH and THROAT: Lips are without lesions. Oral mucosa is moist and without lesions. Tongue is normal in shape, size, and color and without lesions RESPIRATORY: Breathing is even & unlabored, BS CTAB CARDIAC:  no murmur,no extra heart sounds, no edema GI: Abdomen soft, normal BS, no masses, no tenderness EXTREMITIES:  Able to move X 4 extremities NEUROLOGICAL: There is no tremor. Speech is clear. Alert and oriented X 3. PSYCHIATRIC:  Affect and behavior are appropriate  Labs reviewed: Recent Labs    01/05/19 0000 06/14/19 0000 07/02/19 0000  NA 135* 128* 135*  K 4.6 4.5 4.4  CL 96* 87* 96*  CO2 23* 28* 30*  BUN 12 17 10   CREATININE 0.9 1.0 0.9  CALCIUM 9.2 9.4 9.3    Recent Labs    11/23/18 0000 01/09/19 0000 06/14/19 0000  WBC 4.4 5.0 7.3  NEUTROABS  --   --  6  HGB 10.3* 9.7* 9.9*  HCT 30* 29* 29*  PLT 225 236 276   Lab Results  Component Value Date   TSH 3.40 07/11/2019   Lab Results  Component Value Date   HGBA1C 5.2 12/01/2015   Lab Results  Component Value Date   CHOL 107 12/01/2015   HDL 27 (L) 12/01/2015   LDLCALC 59 12/01/2015   TRIG 107 12/01/2015   CHOLHDL 4.0 12/01/2015    Assessment/Plan  1. Advance care planning -Remains to be full code -Discussed medications, vital signs and weights  2. Hypothyroidism due to acquired atrophy of thyroid Lab Results  Component Value Date   TSH 3.40 07/11/2019   -Continue levothyroxine  3. Paroxysmal SVT (supraventricular tachycardia) (HCC) -Rate controlled, continue digoxin, flecainide  4. Chronic obstructive pulmonary disease, unspecified COPD type ([place]) -Stable, continue  PRN albuterol, Mucinex and  Incruse Ellipta  5. Chronic diastolic CHF (congestive heart failure) (HCC) -Stable, continue furosemide and digoxin    Family/ staff Communication: Discussed plan of care with IDT.  Labs/tests ordered: None  Goals of care:   Long-term care   Durenda Age, DNP, MSN, FNP-BC [facility] [phone] (Monday-Friday 8:00 a.m. - 5:00 p.m.) [phone] (after hours)

## 2019-10-14 ENCOUNTER — Other Ambulatory Visit: Payer: Self-pay | Admitting: Adult Health

## 2019-10-14 DIAGNOSIS — G894 Chronic pain syndrome: Secondary | ICD-10-CM

## 2019-10-14 MED ORDER — HYDROCODONE-ACETAMINOPHEN 5-325 MG PO TABS: ORAL_TABLET | ORAL | 0 refills | Status: DC

## 2019-10-18 ENCOUNTER — Encounter: Payer: Self-pay | Admitting: Adult Health

## 2019-10-18 ENCOUNTER — Non-Acute Institutional Stay (SKILLED_NURSING_FACILITY): Payer: Medicare Other | Admitting: Adult Health

## 2019-10-18 DIAGNOSIS — J449 Chronic obstructive pulmonary disease, unspecified: Secondary | ICD-10-CM | POA: Diagnosis not present

## 2019-10-18 DIAGNOSIS — F339 Major depressive disorder, recurrent, unspecified: Secondary | ICD-10-CM

## 2019-10-18 DIAGNOSIS — E032 Hypothyroidism due to medicaments and other exogenous substances: Secondary | ICD-10-CM | POA: Diagnosis not present

## 2019-10-18 DIAGNOSIS — G250 Essential tremor: Secondary | ICD-10-CM

## 2019-10-18 DIAGNOSIS — I5032 Chronic diastolic (congestive) heart failure: Secondary | ICD-10-CM

## 2019-10-18 NOTE — Progress Notes (Signed)
Location:  [facility] Room Number: 202-A Place of Service:  SNF (31) Provider:  Durenda Age, DNP, FNP-BC  Patient Care Team: [name], MD as PCP - General (Internal Medicine) Medina-Vargas, Dawn Lange, NP as Nurse Practitioner (Internal Medicine)  Extended Emergency Contact Information Primary Emergency Contact: Hatfield,Lisa Address: [address]          [place], [place] [address] [place] Work Phone: [phone] Mobile Phone: [phone] Relation: Daughter  Code Status:  FULL CODE  Goals of care: Advanced Directive information Advanced Directives 10/10/2019  Does Patient Have a Medical Advance Directive? No  Type of Advance Directive -  Does patient want to make changes to medical advance directive? -  Would patient like information on creating a medical advance directive? No - Patient declined     Chief Complaint  Patient presents with  . Medical Management of Chronic Issues    Routine [facility] visit    HPI:  Pt is an 79 y.o. female seen today for medical management of chronic diseases.  She is a long-term care resident of [facility].  She has a PMH of COPD, spinal stenosis, depression and anxiety. She was seen today. No edema to extremities nor SOB.  She thinks furosemide 20 mg daily on MWF for chronic diastolic CHF.  She continues to use O2 at 2 L/minute via [place] continuously, as needed albuterol neb every 6 hours and prednisone 5 mg daily for COPD.  She has mild tremors and continues to take propranolol ER 60 mg 1 capsule daily and primidone 50 mg 2 tabs = 100 mg at bedtime.   Past Medical History:  Diagnosis Date  . Anemia   . Chronic diastolic heart failure ([place])   . Chronic kidney disease, stage 3 ([place])   . Chronic pain syndrome   . Constipation   . COPD (chronic obstructive pulmonary disease) ([place])   . Dysphagia   . Frequent falls   . Generalized muscle weakness   . GERD  (gastroesophageal reflux disease)   . Headache   . Hip pain, right   . History of anxiety   . History of depression   . Hyperglycemia   . Hypothyroid   . Insomnia   . Metabolic encephalopathy   . Palpitation   . Polyneuropathy   . Spinal stenosis   . Spinal stenosis, cervical region   . Supraventricular tachycardia ([place])   . Thyrotoxicosis, unspecified with thyrotoxic crisis or storm   . Tremor   . Unsteadiness on feet   . UTI (urinary tract infection)    Past Surgical History:  Procedure Laterality Date  . ANKLE FRACTURE SURGERY Left   . CATARACT EXTRACTION, BILATERAL    . FLEXIBLE SIGMOIDOSCOPY Left 11/18/2015   Procedure: FLEXIBLE SIGMOIDOSCOPY;  Surgeon: [name], MD;  Location: [facility];  Service: Endoscopy;  Laterality: Left;  . FLEXIBLE SIGMOIDOSCOPY N/A 11/20/2015   Procedure: FLEXIBLE SIGMOIDOSCOPY;  Surgeon: [name], MD;  Location: [facility];  Service: Endoscopy;  Laterality: N/A;  . HIP ARTHROPLASTY Right 01/12/2016   Procedure: ARTHROPLASTY BIPOLAR HIP (HEMIARTHROPLASTY);  Surgeon: [name], MD;  Location: [facility];  Service: Orthopedics;  Laterality: Right;    Allergies  Allergen Reactions  . Biofreeze [Menthol (Topical Analgesic)] Rash    By history Aspercreme does not cause a rash    Outpatient Encounter Medications as of 10/18/2019  Medication Sig  . acetaminophen (TYLENOL) 325 MG tablet Take 650 mg by mouth every 6 (  six) hours as needed.   Marland Kitchen albuterol (PROVENTIL) (2.5 MG/3ML) 0.083% nebulizer solution Take 2.5 mg by nebulization every 6 (six) hours as needed for wheezing or shortness of breath.  Marland Kitchen alum & mag hydroxide-simeth (MAALOX PLUS) 400-400-40 MG/5ML suspension Give 30cc PO q2H PRN x 24H (call MD if indigestion is not relieved w/i 24H)  . aspirin 81 MG chewable tablet Chew 81 mg by mouth daily.   . calcium carbonate (TUMS - DOSED IN MG ELEMENTAL CALCIUM) 500 MG chewable tablet Chew 2 tablets by mouth every 8 (eight) hours as needed for  indigestion or heartburn.  . digoxin (LANOXIN) 0.125 MG tablet Take 0.125 mg by mouth daily. Hold if HR <60  . DULoxetine (CYMBALTA) 60 MG capsule Take 120 mg by mouth daily. For Depression  . Eyelid Cleansers (STERILID EX) Apply 1 each topically in the morning. Apply to closed eyelids and gently rub it in then wipe it.  . fexofenadine (ALLEGRA) 180 MG tablet Take 180 mg by mouth daily.  . flecainide (TAMBOCOR) 50 MG tablet Take 50 mg by mouth every 12 (twelve) hours.  . fluticasone (FLONASE ALLERGY RELIEF) 50 MCG/ACT nasal spray Place 1 spray into both nostrils daily as needed.   . furosemide (LASIX) 20 MG tablet Take 20 mg by mouth every Monday, Wednesday, and Friday. For CHF   . guaiFENesin (MUCINEX) 600 MG 12 hr tablet Take 600 mg by mouth 2 (two) times daily. COPD cough  . HYDROcodone-acetaminophen (NORCO/VICODIN) 5-325 MG tablet Give **1/2 tablet by mouth q 6 hrs PRN  . levothyroxine (SYNTHROID) 25 MCG tablet Take 25 mcg by mouth daily before breakfast.  . Multiple Vitamins-Minerals (MULTIVITAMIN WITH MINERALS) tablet Take 1 tablet by mouth daily.  . ondansetron (ZOFRAN) 4 MG tablet Take 4 mg by mouth every 8 (eight) hours as needed for nausea.   . OXYGEN Inhale 2 L into the lungs at bedtime.  . pantoprazole (PROTONIX) 40 MG tablet Take 40 mg by mouth daily.  . polyethylene glycol (MIRALAX / GLYCOLAX) packet Take 17 g by mouth daily.  . polyvinyl alcohol (ARTIFICIAL TEARS) 1.4 % ophthalmic solution Place 2 drops into both eyes in the morning, at noon, in the evening, and at bedtime. For dry eyes   . predniSONE (DELTASONE) 5 MG tablet TAKE 1 TABLET BY MOUTH DAILY (WITH BREAKFAST) NO:IBBC  . primidone (MYSOLINE) 50 MG tablet Take 100 mg by mouth at bedtime.   . propranolol ER (INDERAL LA) 60 MG 24 hr capsule Take 1 capsule (60 mg total) by mouth daily.  . sennosides-docusate sodium (SENOKOT-S) 8.6-50 MG tablet Take 1 tablet by mouth 2 (two) times daily.   . traZODone (DESYREL) 50 MG tablet  Take 1/2 tab =25 mg by mouth at bedtime  . traZODone (DESYREL) 50 MG tablet Take 12.5 mg by mouth 2 (two) times daily. 1/4 tablet to = 12.5 mg  . umeclidinium bromide (INCRUSE ELLIPTA) 62.5 MCG/INH AEPB Inhale 1 puff into the lungs daily. (SHAKE WELL)  . [DISCONTINUED] Nutritional Supplement LIQD Take 1 each by mouth 2 (two) times daily. Mighty Shake   No facility-administered encounter medications on file as of 10/18/2019.    Review of Systems  GENERAL: No change in appetite, no fatigue, no weight changes, no fever, chills or weakness MOUTH and THROAT: Denies oral discomfort, gingival pain or bleeding RESPIRATORY: no cough, SOB, DOE, wheezing, hemoptysis CARDIAC: No chest pain, edema or palpitations GI: No abdominal pain, diarrhea, constipation, heart burn, nausea or vomiting GU: Denies dysuria, frequency, hematuria,  incontinence, or discharge NEUROLOGICAL: Denies dizziness, syncope, numbness, or headache PSYCHIATRIC: Denies feelings of depression or anxiety. No report of hallucinations, insomnia, paranoia, or agitation   Immunization History  Administered Date(s) Administered  . Influenza, High Dose Seasonal PF 10/04/2016  . Influenza,inj,Quad PF,6+ Mos 01/13/2016  . Influenza-Unspecified 11/10/2017, 10/09/2018  . Moderna SARS-COVID-2 Vaccination 01/28/2019, 02/25/2019  . Pneumococcal-Unspecified 10/11/2011  . Tdap 01/23/2016   Pertinent  Health Maintenance Due  Topic Date Due  . INFLUENZA VACCINE  04/09/2020 (Originally 08/11/2019)  . PNA vac Low Risk Adult  Completed  . DEXA SCAN  Discontinued   Fall Risk  10/24/2018 07/19/2018 01/17/2018 11/09/2017 07/14/2017  Falls in the past year? 0 0 0 No No  Number falls in past yr: 0 0 0 - -  Injury with Fall? 0 0 0 - -  Comment - - - - -  Risk Factor Category  - - - - -  Follow up - - Falls evaluation completed - -     Vitals:   10/18/19 0911  BP: 128/70  Pulse: 61  Resp: 18  Temp: (!) 97.5 F (36.4 C)  TempSrc: Oral  Weight:  131 lb 9.6 oz (59.7 kg)  Height: 5\' 8"  (1.727 m)   Body mass index is 20.01 kg/m.  Physical Exam  GENERAL APPEARANCE: Well nourished. In no acute distress. Normal body habitus SKIN:  Skin is warm and dry.  MOUTH and THROAT: Lips are without lesions. Oral mucosa is moist and without lesions. Tongue is normal in shape, size, and color and without lesions RESPIRATORY: Breathing is even & unlabored, BS CTAB CARDIAC: RRR, no murmur,no extra heart sounds, no edema GI: Abdomen soft, normal BS, no masses, no tenderness NEUROLOGICAL: +tremor. Speech is clear.Alert and oriented X 3.  PSYCHIATRIC:  Affect and behavior are appropriate  Labs reviewed: Recent Labs    01/05/19 0000 06/14/19 0000 07/02/19 0000  NA 135* 128* 135*  K 4.6 4.5 4.4  CL 96* 87* 96*  CO2 23* 28* 30*  BUN 12 17 10   CREATININE 0.9 1.0 0.9  CALCIUM 9.2 9.4 9.3    Recent Labs    11/23/18 0000 01/09/19 0000 06/14/19 0000  WBC 4.4 5.0 7.3  NEUTROABS  --   --  6  HGB 10.3* 9.7* 9.9*  HCT 30* 29* 29*  PLT 225 236 276   Lab Results  Component Value Date   TSH 3.40 07/11/2019   Lab Results  Component Value Date   HGBA1C 5.2 12/01/2015   Lab Results  Component Value Date   CHOL 107 12/01/2015   HDL 27 (L) 12/01/2015   LDLCALC 59 12/01/2015   TRIG 107 12/01/2015   CHOLHDL 4.0 12/01/2015    Assessment/Plan  1. Chronic obstructive pulmonary disease, unspecified COPD type ([place]) -  No wheezing, stable -Continue PRN albuterol and prednisone  2. Chronic diastolic CHF (congestive heart failure) ([facility]) -  No edema nor SOB, continue furosemide  3. 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 Depression, recurrent ([place]) -Mood is a stable, continue duloxetine     Family/ staff Communication: Discussed plan of care with resident and  charge nurse.  Labs/tests ordered: None  Goals of care:   Long-term care    Durenda Age, DNP, MSN, FNP-BC [facility] [phone] (Monday-Friday 8:00 a.m. - 5:00 p.m.) [phone] (  after hours)

## 2019-10-23 DIAGNOSIS — F331 Major depressive disorder, recurrent, moderate: Secondary | ICD-10-CM | POA: Diagnosis not present

## 2019-10-23 DIAGNOSIS — F411 Generalized anxiety disorder: Secondary | ICD-10-CM | POA: Diagnosis not present

## 2019-10-23 DIAGNOSIS — G4700 Insomnia, unspecified: Secondary | ICD-10-CM | POA: Diagnosis not present

## 2019-10-28 DIAGNOSIS — M6281 Muscle weakness (generalized): Secondary | ICD-10-CM | POA: Diagnosis not present

## 2019-10-28 DIAGNOSIS — J449 Chronic obstructive pulmonary disease, unspecified: Secondary | ICD-10-CM | POA: Diagnosis not present

## 2019-10-28 DIAGNOSIS — M4802 Spinal stenosis, cervical region: Secondary | ICD-10-CM | POA: Diagnosis not present

## 2019-10-28 DIAGNOSIS — M542 Cervicalgia: Secondary | ICD-10-CM | POA: Diagnosis not present

## 2019-10-29 DIAGNOSIS — M542 Cervicalgia: Secondary | ICD-10-CM | POA: Diagnosis not present

## 2019-10-29 DIAGNOSIS — M4802 Spinal stenosis, cervical region: Secondary | ICD-10-CM | POA: Diagnosis not present

## 2019-10-29 DIAGNOSIS — M6281 Muscle weakness (generalized): Secondary | ICD-10-CM | POA: Diagnosis not present

## 2019-10-29 DIAGNOSIS — J449 Chronic obstructive pulmonary disease, unspecified: Secondary | ICD-10-CM | POA: Diagnosis not present

## 2019-10-30 DIAGNOSIS — M6281 Muscle weakness (generalized): Secondary | ICD-10-CM | POA: Diagnosis not present

## 2019-10-30 DIAGNOSIS — F411 Generalized anxiety disorder: Secondary | ICD-10-CM | POA: Diagnosis not present

## 2019-10-30 DIAGNOSIS — F331 Major depressive disorder, recurrent, moderate: Secondary | ICD-10-CM | POA: Diagnosis not present

## 2019-10-30 DIAGNOSIS — M542 Cervicalgia: Secondary | ICD-10-CM | POA: Diagnosis not present

## 2019-10-30 DIAGNOSIS — G4700 Insomnia, unspecified: Secondary | ICD-10-CM | POA: Diagnosis not present

## 2019-10-30 DIAGNOSIS — J449 Chronic obstructive pulmonary disease, unspecified: Secondary | ICD-10-CM | POA: Diagnosis not present

## 2019-10-30 DIAGNOSIS — M4802 Spinal stenosis, cervical region: Secondary | ICD-10-CM | POA: Diagnosis not present

## 2019-10-31 DIAGNOSIS — Z23 Encounter for immunization: Secondary | ICD-10-CM | POA: Diagnosis not present

## 2019-10-31 DIAGNOSIS — M542 Cervicalgia: Secondary | ICD-10-CM | POA: Diagnosis not present

## 2019-10-31 DIAGNOSIS — M4802 Spinal stenosis, cervical region: Secondary | ICD-10-CM | POA: Diagnosis not present

## 2019-10-31 DIAGNOSIS — J449 Chronic obstructive pulmonary disease, unspecified: Secondary | ICD-10-CM | POA: Diagnosis not present

## 2019-10-31 DIAGNOSIS — M6281 Muscle weakness (generalized): Secondary | ICD-10-CM | POA: Diagnosis not present

## 2019-11-01 DIAGNOSIS — J449 Chronic obstructive pulmonary disease, unspecified: Secondary | ICD-10-CM | POA: Diagnosis not present

## 2019-11-01 DIAGNOSIS — M6281 Muscle weakness (generalized): Secondary | ICD-10-CM | POA: Diagnosis not present

## 2019-11-01 DIAGNOSIS — M542 Cervicalgia: Secondary | ICD-10-CM | POA: Diagnosis not present

## 2019-11-01 DIAGNOSIS — M4802 Spinal stenosis, cervical region: Secondary | ICD-10-CM | POA: Diagnosis not present

## 2019-11-04 DIAGNOSIS — J449 Chronic obstructive pulmonary disease, unspecified: Secondary | ICD-10-CM | POA: Diagnosis not present

## 2019-11-04 DIAGNOSIS — M4802 Spinal stenosis, cervical region: Secondary | ICD-10-CM | POA: Diagnosis not present

## 2019-11-04 DIAGNOSIS — M6281 Muscle weakness (generalized): Secondary | ICD-10-CM | POA: Diagnosis not present

## 2019-11-04 DIAGNOSIS — M542 Cervicalgia: Secondary | ICD-10-CM | POA: Diagnosis not present

## 2019-11-05 ENCOUNTER — Encounter: Payer: Self-pay | Admitting: Adult Health

## 2019-11-05 ENCOUNTER — Non-Acute Institutional Stay (INDEPENDENT_AMBULATORY_CARE_PROVIDER_SITE_OTHER): Payer: Medicare Other | Admitting: Adult Health

## 2019-11-05 DIAGNOSIS — Z0000 Encounter for general adult medical examination without abnormal findings: Secondary | ICD-10-CM | POA: Diagnosis not present

## 2019-11-05 DIAGNOSIS — M6281 Muscle weakness (generalized): Secondary | ICD-10-CM | POA: Diagnosis not present

## 2019-11-05 DIAGNOSIS — M542 Cervicalgia: Secondary | ICD-10-CM | POA: Diagnosis not present

## 2019-11-05 DIAGNOSIS — M4802 Spinal stenosis, cervical region: Secondary | ICD-10-CM | POA: Diagnosis not present

## 2019-11-05 DIAGNOSIS — J449 Chronic obstructive pulmonary disease, unspecified: Secondary | ICD-10-CM | POA: Diagnosis not present

## 2019-11-05 NOTE — Progress Notes (Signed)
Subjective:   Dawn [name] is a 79 y.o. female who presents for Medicare Annual (Subsequent) preventive examination.  Review of Systems     Cardiac Risk Factors include: advanced age (>50men, >91 women)     Objective:    Today's Vitals   11/05/19 0911  BP: 134/68  Pulse: 68  Resp: 16  Temp: (!) 97.5 F (36.4 C)  TempSrc: Oral  SpO2: 94%  Weight: 133 lb (60.3 kg)  Height: 5\' 8"  (1.727 m)   Body mass index is 20.22 kg/m.  Advanced Directives 11/05/2019 10/10/2019 06/21/2019 04/16/2019 03/19/2019 02/26/2019 02/05/2019  Does Patient Have a Medical Advance Directive? No No Yes Yes Yes Yes Yes  Type of Advance Directive - - (No Data) (No Data) (No Data) (No Data) (No Data)  Does patient want to make changes to medical advance directive? - - No - Patient declined No - Patient declined No - Patient declined No - Patient declined No - Patient declined  Would patient like information on creating a medical advance directive? No - Patient declined No - Patient declined - - - - -    Current Medications (verified) Outpatient Encounter Medications as of 11/05/2019  Medication Sig  . acetaminophen (TYLENOL) 325 MG tablet Take 650 mg by mouth every 6 (six) hours as needed.   Marland Kitchen albuterol (PROVENTIL) (2.5 MG/3ML) 0.083% nebulizer solution Take 2.5 mg by nebulization every 6 (six) hours as needed for wheezing or shortness of breath.  Marland Kitchen alum & mag hydroxide-simeth (MAALOX PLUS) 400-400-40 MG/5ML suspension Give 30cc PO q2H PRN x 24H (call MD if indigestion is not relieved w/i 24H)  . aspirin 81 MG chewable tablet Chew 81 mg by mouth daily.   . calcium carbonate (TUMS - DOSED IN MG ELEMENTAL CALCIUM) 500 MG chewable tablet Chew 2 tablets by mouth every 8 (eight) hours as needed for indigestion or heartburn.  . digoxin (LANOXIN) 0.125 MG tablet Take 0.125 mg by mouth daily. Hold if HR <60  . DULoxetine (CYMBALTA) 60 MG capsule Take 120 mg by mouth daily. For Depression  . Eyelid Cleansers  (STERILID EX) Apply 1 each topically in the morning. Apply to closed eyelids and gently rub it in then wipe it.  . fexofenadine (ALLEGRA) 180 MG tablet Take 180 mg by mouth daily.  . flecainide (TAMBOCOR) 50 MG tablet Take 50 mg by mouth every 12 (twelve) hours.  . fluticasone (FLONASE ALLERGY RELIEF) 50 MCG/ACT nasal spray Place 1 spray into both nostrils daily as needed.   . furosemide (LASIX) 20 MG tablet Take 20 mg by mouth every Monday, Wednesday, and Friday. For CHF   . guaiFENesin (MUCINEX) 600 MG 12 hr tablet Take 600 mg by mouth 2 (two) times daily. COPD cough  . HYDROcodone-acetaminophen (NORCO/VICODIN) 5-325 MG tablet Give **1/2 tablet by mouth q 6 hrs PRN  . levothyroxine (SYNTHROID) 25 MCG tablet Take 25 mcg by mouth daily before breakfast.  . Multiple Vitamins-Minerals (MULTIVITAMIN WITH MINERALS) tablet Take 1 tablet by mouth daily.  . ondansetron (ZOFRAN) 4 MG tablet Take 4 mg by mouth every 8 (eight) hours as needed for nausea.   . OXYGEN Inhale 2 L into the lungs at bedtime.  . pantoprazole (PROTONIX) 40 MG tablet Take 40 mg by mouth daily.  . polyethylene glycol (MIRALAX / GLYCOLAX) packet Take 17 g by mouth daily.  . polyvinyl alcohol (ARTIFICIAL TEARS) 1.4 % ophthalmic solution Place 2 drops into both eyes in the morning, at noon, in the evening, and at  bedtime. For dry eyes   . predniSONE (DELTASONE) 5 MG tablet TAKE 1 TABLET BY MOUTH DAILY (WITH BREAKFAST) [id number]:ZHYQ  . primidone (MYSOLINE) 50 MG tablet Take 100 mg by mouth at bedtime.   . propranolol ER (INDERAL LA) 60 MG 24 hr capsule Take 1 capsule (60 mg total) by mouth daily.  . sennosides-docusate sodium (SENOKOT-S) 8.6-50 MG tablet Take 1 tablet by mouth 2 (two) times daily.   . traZODone (DESYREL) 50 MG tablet Take 1/2 tab =25 mg by mouth at bedtime  . traZODone (DESYREL) 50 MG tablet Take 12.5 mg by mouth 2 (two) times daily. 1/4 tablet to = 12.5 mg  . umeclidinium bromide (INCRUSE ELLIPTA) 62.5 MCG/INH AEPB Inhale 1  puff into the lungs daily. (SHAKE WELL)   No facility-administered encounter medications on file as of 11/05/2019.    Allergies (verified) Biofreeze [menthol (topical analgesic)]   History: Past Medical History:  Diagnosis Date  . Anemia   . Chronic diastolic heart failure ([place])   . Chronic kidney disease, stage 3 ([place])   . Chronic pain syndrome   . Constipation   . COPD (chronic obstructive pulmonary disease) ([place])   . Dysphagia   . Frequent falls   . Generalized muscle weakness   . GERD (gastroesophageal reflux disease)   . Headache   . Hip pain, right   . History of anxiety   . History of depression   . Hyperglycemia   . Hypothyroid   . Insomnia   . Metabolic encephalopathy   . Palpitation   . Polyneuropathy   . Spinal stenosis   . Spinal stenosis, cervical region   . Supraventricular tachycardia ([place])   . Thyrotoxicosis, unspecified with thyrotoxic crisis or storm   . Tremor   . Unsteadiness on feet   . UTI (urinary tract infection)    Past Surgical History:  Procedure Laterality Date  . ANKLE FRACTURE SURGERY Left   . CATARACT EXTRACTION, BILATERAL    . FLEXIBLE SIGMOIDOSCOPY Left 11/18/2015   Procedure: FLEXIBLE SIGMOIDOSCOPY;  Surgeon: [name], MD;  Location: [place];  Service: Endoscopy;  Laterality: Left;  . FLEXIBLE SIGMOIDOSCOPY N/A 11/20/2015   Procedure: FLEXIBLE SIGMOIDOSCOPY;  Surgeon: [name], MD;  Location: [facility];  Service: Endoscopy;  Laterality: N/A;  . HIP ARTHROPLASTY Right 01/12/2016   Procedure: ARTHROPLASTY BIPOLAR HIP (HEMIARTHROPLASTY);  Surgeon: [name], MD;  Location[facility];  Service: Orthopedics;  Laterality: Right;   Family History  Problem Relation Age of Onset  . Heart attack Mother   . Diabetes Father   . Breast cancer Sister   . Thyroid disease Neg Hx    Social History   Socioeconomic History  . Marital status: Divorced    Spouse name: Not on file  . Number of children: 1  . Years of education: 11th  grade  . Highest education level: Not on file  Occupational History  . 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 Smoking status: Former Smoker    Quit date: 12/11/2015    Years since quitting: 3.9  . Smokeless tobacco: Never Used  . Tobacco comment: Smoked age [age] up to < 1 ppd, usually half a pack per day, mainly one half pack per day  Vaping Use  . Vaping Use: Never used  Substance and Sexual Activity  . Alcohol use: No  . Drug use: No  . Sexual activity: Not Currently  Other Topics Concern  .  Not on file  Social History Narrative   She is a long-term care resident of [facility].   Right handed.   Occasional caffeine.         Social Determinants of Health   Financial Resource Strain:   . Difficulty of Paying Living Expenses: Not on file  Food Insecurity:   . Worried About Charity fundraiser in the Last Year: Not on file  . Ran Out of Food in the Last Year: Not on file  Transportation Needs:   . Lack of Transportation (Medical): Not on file  . Lack of Transportation (Non-Medical): Not on file  Physical Activity:   . Days of Exercise per Week: Not on file  . Minutes of Exercise per Session: Not on file  Stress:   . Feeling of Stress : Not on file  Social Connections:   . Frequency of Communication with Friends and Family: Not on file  . Frequency of Social Gatherings with Friends and Family: Not on file  . Attends Religious Services: Not on file  . Active Member of Clubs or Organizations: Not on file  . Attends Archivist Meetings: Not on file  . Marital Status: Not on file    Tobacco Counseling Counseling given: Not Answered Comment: Smoked age 1-69 up to < 1 ppd, usually half a pack per day, mainly one half pack per day   Clinical Intake:  Pre-visit preparation completed: No  Pain : No/denies pain     BMI - recorded: 20.22 Nutritional Status: BMI of 19-24   Normal Nutritional Risks: None Diabetes: No  How often do you need to have someone help you when you read instructions, pamphlets, or other written materials from your doctor or pharmacy?: 4 - Often What is the last grade level you completed in school?: 11th grade  Diabetic? No  Interpreter Needed?: No  Information entered by :: Emmabelle Fear Medina-Vargas DNP   Activities of Daily Living In your present state of health, do you have any difficulty performing the following activities: 11/05/2019 11/05/2019  Hearing? N N  Vision? N N  Difficulty concentrating or making decisions? N N  Walking or climbing stairs? Y Y  Comment - Uses wheelchair  Dressing or bathing? N N  Doing errands, shopping? N N  Comment - lives in [place] and eating ? N N  Comment - lives in [place]? N N  In the past six months, have you accidently leaked urine? - N  Do you have problems with loss of bowel control? - N  Managing your Medications? - N  Comment - lives in [place]  Managing your Finances? - N  Comment - lives in [place] or managing your Housekeeping? - N  Comment - lives in [place] recent data might be hidden    Patient Care Team: [name], MD as PCP - General (Internal Medicine) Medina-Vargas, [name], NP as Nurse Practitioner (Internal Medicine)  Indicate any recent Medical Services you may have received from other than [facility] providers in the past year (date may be approximate).     Assessment:   This is a routine wellness examination for [place].  Hearing/Vision screen  Hearing Screening   125Hz  250Hz  500Hz  1000Hz  2000Hz  3000Hz  4000Hz  6000Hz  8000Hz   Right ear:           Left ear:           Comments: Not done  Vision  Screening Comments: Saw Dr. Katy Fitch on 05/22/19  Dietary issues and exercise activities discussed: Current Exercise Habits: Structured exercise class, Type of exercise: stretching, Time (Minutes): 15, Frequency (Times/Week): 3,  Weekly Exercise (Minutes/Week): 45, Intensity: Mild, Exercise limited by: cardiac condition(s);respiratory conditions(s)  Goals    .  DIET - EAT MORE FRUITS AND VEGETABLES (pt-stated)      Eat more fresh fruits and vegetables       Depression Screen PHQ 2/9 Scores 11/05/2019 11/09/2017 11/08/2016  PHQ - 2 Score 0 4 4  PHQ- 9 Score - 10 11    Fall Risk Fall Risk  11/05/2019 10/24/2018 07/19/2018 01/17/2018 11/09/2017  Falls in the past year? 1 0 0 0 No  Number falls in past yr: 0 0 0 0 -  Injury with Fall? 0 0 0 0 -  Comment - - - - -  Risk Factor Category  - - - - -  Risk for fall due to : History of fall(s);Impaired balance/gait;Impaired mobility - - - -  Follow up Falls evaluation completed;Education provided - - Falls evaluation completed -    Any stairs in or around the home? No  If so, are there any without handrails? Yes  Home free of loose throw rugs in walkways, pet beds, electrical cords, etc? No  Adequate lighting in your home to reduce risk of falls? Yes   ASSISTIVE DEVICES UTILIZED TO PREVENT FALLS:  Life alert? No  Use of a cane, walker or w/c? Yes  Grab bars in the bathroom? Yes  Shower chair or bench in shower? Yes  Elevated toilet seat or a handicapped toilet? Yes   TIMED UP AND GO:  Was the test performed? No .  Length of time to ambulate N/A  Gait unsteady with use of assistive device, provider informed and education provided.   Cognitive Function: MMSE - Mini Mental State Exam 11/05/2019  Orientation to time 5  Orientation to Place 5  Registration 3  Attention/ Calculation 5  Recall 3  Language- name 2 objects 2  Language- repeat 1  Language- follow 3 step command 3  Language- read & follow direction 1  Write a sentence 1  Copy design 1  Total score 30     6CIT Screen 11/09/2017 11/08/2016  What Year? 0 points 0 points  What month? 0 points 0 points  What time? 0 points 0 points  Count back from 20 0 points 0 points  Months in reverse 2  points 0 points  Repeat phrase 4 points 2 points  Total Score 6 2    Immunizations Immunization History  Administered Date(s) Administered  . Influenza, High Dose Seasonal PF 10/04/2016  . Influenza,inj,Quad PF,6+ Mos 01/13/2016  . Influenza-Unspecified 11/10/2017, 10/09/2018  . Moderna SARS-COVID-2 Vaccination 01/28/2019, 02/25/2019  . Pneumococcal-Unspecified 10/11/2011  . Tdap 01/23/2016    TDAP status: Due, Education has been provided regarding the importance of this vaccine. Advised may receive this vaccine at local pharmacy or Health Dept. Aware to provide a copy of the vaccination record if obtained from local pharmacy or Health Dept. Verbalized acceptance and understanding. Flu Vaccine status: Up to date Pneumococcal vaccine status: Up to date Covid-19 vaccine status: Completed vaccines  Qualifies for Shingles Vaccine? will order   Zostavax completed No   Shingrix Completed?: No.    Education has been provided regarding the importance of this vaccine. Patient has been advised to call insurance company to determine out of pocket expense if they have not yet received this  vaccine. Advised may also receive vaccine at local pharmacy or Health Dept. Verbalized acceptance and understanding.  Screening Tests Health Maintenance  Topic Date Due  . INFLUENZA VACCINE  04/09/2020 (Originally 08/11/2019)  . TETANUS/TDAP  01/22/2026  . COVID-19 Vaccine  Completed  . PNA vac Low Risk Adult  Completed  . DEXA SCAN  Discontinued    Health Maintenance  There are no preventive care reminders to display for this patient.  Colorectal cancer screening: No longer required.  Mammogram status: No longer required. Declined Bone Density status: Ordered ordered. Pt provided with contact info and advised to call to schedule appt.  Lung Cancer Screening: (Low Dose CT Chest recommended if Age 81-80 years, 30 pack-year currently smoking OR have quit w/in 15years.) does not qualify.   Lung Cancer  Screening Referral: NO  Additional Screening:  Hepatitis C Screening: does qualify; Completed Ordered  Vision Screening: Recommended annual ophthalmology exams for early detection of glaucoma and other disorders of the eye. Is the patient up to date with their annual eye exam?  Yes  Who is the provider or what is the name of the office in which the patient attends annual eye exams? Dr. Katy Fitch If pt is not established with a provider, would they like to be referred to a provider to establish care? Yes .   Dental Screening: Recommended annual dental exams for proper oral hygiene  Community Resource Referral / Chronic Care Management: CRR required this visit?  Yes   CCM required this visit?  Yes      Plan:     I have personally reviewed and noted the following in the patient's chart:   . Medical and social history . Use of alcohol, tobacco or illicit drugs  . Current medications and supplements . Functional ability and status . Nutritional status . Physical activity . Advanced directives . List of other physicians . Hospitalizations, surgeries, and ER visits in previous 12 months . Vitals . Screenings to include cognitive, depression, and falls . Referrals and appointments  In addition, I have reviewed and discussed with patient certain preventive protocols, quality metrics, and best practice recommendations. A written personalized care plan for preventive services as well as general preventive health recommendations were provided to patient.     Solomiya Pascale Medina-Vargas, NP   11/05/2019          Subjective:   Dawn [name] is a 79 y.o. female who presents for Medicare Annual (Subsequent) preventive examination.  Review of Systems     Cardiac Risk Factors include: advanced age (>15men, >31 women)     Objective:    Today's Vitals   11/05/19 0911  BP: 134/68  Pulse: 68  Resp: 16  Temp: (!) 97.5 F (36.4 C)  TempSrc: Oral  SpO2: 94%  Weight: 133 lb  (60.3 kg)  Height: 5\' 8"  (1.727 m)   Body mass index is 20.22 kg/m.  Advanced Directives 11/05/2019 10/10/2019 06/21/2019 04/16/2019 03/19/2019 02/26/2019 02/05/2019  Does Patient Have a Medical Advance Directive? No No Yes Yes Yes Yes Yes  Type of Advance Directive - - (No Data) (No Data) (No Data) (No Data) (No Data)  Does patient want to make changes to medical advance directive? - - No - Patient declined No - Patient declined No - Patient declined No - Patient declined No - Patient declined  Would patient like information on creating a medical advance directive? No - Patient declined No - Patient declined - - - - -    Current  Medications (verified) Outpatient Encounter Medications as of 11/05/2019  Medication Sig  . acetaminophen (TYLENOL) 325 MG tablet Take 650 mg by mouth every 6 (six) hours as needed.   Marland Kitchen albuterol (PROVENTIL) (2.5 MG/3ML) 0.083% nebulizer solution Take 2.5 mg by nebulization every 6 (six) hours as needed for wheezing or shortness of breath.  Marland Kitchen alum & mag hydroxide-simeth (MAALOX PLUS) 400-400-40 MG/5ML suspension Give 30cc PO q2H PRN x 24H (call MD if indigestion is not relieved w/i 24H)  . aspirin 81 MG chewable tablet Chew 81 mg by mouth daily.   . calcium carbonate (TUMS - DOSED IN MG ELEMENTAL CALCIUM) 500 MG chewable tablet Chew 2 tablets by mouth every 8 (eight) hours as needed for indigestion or heartburn.  . digoxin (LANOXIN) 0.125 MG tablet Take 0.125 mg by mouth daily. Hold if HR <60  . DULoxetine (CYMBALTA) 60 MG capsule Take 120 mg by mouth daily. For Depression  . Eyelid Cleansers (STERILID EX) Apply 1 each topically in the morning. Apply to closed eyelids and gently rub it in then wipe it.  . fexofenadine (ALLEGRA) 180 MG tablet Take 180 mg by mouth daily.  . flecainide (TAMBOCOR) 50 MG tablet Take 50 mg by mouth every 12 (twelve) hours.  . fluticasone (FLONASE ALLERGY RELIEF) 50 MCG/ACT nasal spray Place 1 spray into both nostrils daily as needed.   .  furosemide (LASIX) 20 MG tablet Take 20 mg by mouth every Monday, Wednesday, and Friday. For CHF   . guaiFENesin (MUCINEX) 600 MG 12 hr tablet Take 600 mg by mouth 2 (two) times daily. COPD cough  . HYDROcodone-acetaminophen (NORCO/VICODIN) 5-325 MG tablet Give **1/2 tablet by mouth q 6 hrs PRN  . levothyroxine (SYNTHROID) 25 MCG tablet Take 25 mcg by mouth daily before breakfast.  . Multiple Vitamins-Minerals (MULTIVITAMIN WITH MINERALS) tablet Take 1 tablet by mouth daily.  . ondansetron (ZOFRAN) 4 MG tablet Take 4 mg by mouth every 8 (eight) hours as needed for nausea.   . OXYGEN Inhale 2 L into the lungs at bedtime.  . pantoprazole (PROTONIX) 40 MG tablet Take 40 mg by mouth daily.  . polyethylene glycol (MIRALAX / GLYCOLAX) packet Take 17 g by mouth daily.  . polyvinyl alcohol (ARTIFICIAL TEARS) 1.4 % ophthalmic solution Place 2 drops into both eyes in the morning, at noon, in the evening, and at bedtime. For dry eyes   . predniSONE (DELTASONE) 5 MG tablet TAKE 1 TABLET BY MOUTH DAILY (WITH BREAKFAST) FK:CLEX  . primidone (MYSOLINE) 50 MG tablet Take 100 mg by mouth at bedtime.   . propranolol ER (INDERAL LA) 60 MG 24 hr capsule Take 1 capsule (60 mg total) by mouth daily.  . sennosides-docusate sodium (SENOKOT-S) 8.6-50 MG tablet Take 1 tablet by mouth 2 (two) times daily.   . traZODone (DESYREL) 50 MG tablet Take 1/2 tab =25 mg by mouth at bedtime  . traZODone (DESYREL) 50 MG tablet Take 12.5 mg by mouth 2 (two) times daily. 1/4 tablet to = 12.5 mg  . umeclidinium bromide (INCRUSE ELLIPTA) 62.5 MCG/INH AEPB Inhale 1 puff into the lungs daily. (SHAKE WELL)   No facility-administered encounter medications on file as of 11/05/2019.    Allergies (verified) Biofreeze [menthol (topical analgesic)]   History: Past Medical History:  Diagnosis Date  . Anemia   . Chronic diastolic heart failure ([place])   . Chronic kidney disease, stage 3 ([place])   . Chronic pain syndrome   . Constipation   .  COPD (chronic obstructive  pulmonary disease) ([place])   . Dysphagia   . Frequent falls   . Generalized muscle weakness   . GERD (gastroesophageal reflux disease)   . Headache   . Hip pain, right   . History of anxiety   . History of depression   . Hyperglycemia   . Hypothyroid   . Insomnia   . Metabolic encephalopathy   . Palpitation   . Polyneuropathy   . Spinal stenosis   . Spinal stenosis, cervical region   . Supraventricular tachycardia ([place])   . Thyrotoxicosis, unspecified with thyrotoxic crisis or storm   . Tremor   . Unsteadiness on feet   . UTI (urinary tract infection)    Past Surgical History:  Procedure Laterality Date  . ANKLE FRACTURE SURGERY Left   . CATARACT EXTRACTION, BILATERAL    . FLEXIBLE SIGMOIDOSCOPY Left 11/18/2015   Procedure: FLEXIBLE SIGMOIDOSCOPY;  Surgeon: [name], MD;  Location: [place];  Service: Endoscopy;  Laterality: Left;  . FLEXIBLE SIGMOIDOSCOPY N/A 11/20/2015   Procedure: FLEXIBLE SIGMOIDOSCOPY;  Surgeon: [name], MD;  [facility];  Service: Endoscopy;  Laterality: N/A;  . HIP ARTHROPLASTY Right 01/12/2016   Procedure: ARTHROPLASTY BIPOLAR HIP (HEMIARTHROPLASTY);  Surgeon: [name], MD;  [facility];  Service: Orthopedics;  Laterality: Right;   Family History  Problem Relation Age of Onset  . Heart attack Mother   . Diabetes Father   . Breast cancer Sister   . Thyroid disease Neg Hx    Social History   Socioeconomic History  . Marital status: Divorced    Spouse name: Not on file  . Number of children: 1  . Years of education: 11th grade  . Highest education level: Not on file  Occupational History  . 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 Smoking status: Former Smoker    Quit date: 12/11/2015    Years since quitting: 3.9  . Smokeless tobacco: Never Used  . Tobacco comment: Smoked age [age] up to < 1 ppd, usually half a pack per day, mainly one  half pack per day  Vaping Use  . Vaping Use: Never used  Substance and Sexual Activity  . Alcohol use: No  . Drug use: No  . Sexual activity: Not Currently  Other Topics Concern  . Not on file  Social History Narrative   She is a long-term care resident of [facility].   Right handed.   Occasional caffeine.         Social Determinants of Health   Financial Resource Strain:   . Difficulty of Paying Living Expenses: Not on file  Food Insecurity:   . Worried About Charity fundraiser in the Last Year: Not on file  . Ran Out of Food in the Last Year: Not on file  Transportation Needs:   . Lack of Transportation (Medical): Not on file  . Lack of Transportation (Non-Medical): Not on file  Physical Activity:   . Days of Exercise per Week: Not on file  . Minutes of Exercise per Session: Not on file  Stress:   . Feeling of Stress : Not on file  Social Connections:   . Frequency of Communication with Friends and Family: Not on file  . Frequency of Social Gatherings with Friends and Family: Not on file  . Attends Religious Services: Not on file  . Active Member of Clubs or Organizations: Not on file  .  Attends Archivist Meetings: Not on file  . Marital Status: Not on file    Tobacco Counseling Counseling given: Not Answered Comment: Smoked age [age] up to < 1 ppd, usually half a pack per day, mainly one half pack per day   Clinical Intake:  Pre-visit preparation completed: No  Pain : No/denies pain     BMI - recorded: 20.22 Nutritional Status: BMI of 19-24  Normal Nutritional Risks: None Diabetes: No  How often do you need to have someone help you when you read instructions, pamphlets, or other written materials from your doctor or pharmacy?: 4 - Often What is the last grade level you completed in school?: 11th grade  Diabetic? No  Interpreter Needed?: No  Information entered by :: Joby Richart Medina-Vargas DNP   Activities of  Daily Living In your present state of health, do you have any difficulty performing the following activities: 11/05/2019 11/05/2019  Hearing? N N  Vision? N N  Difficulty concentrating or making decisions? N N  Walking or climbing stairs? Y Y  Comment - Uses wheelchair  Dressing or bathing? N N  Doing errands, shopping? N N  Comment - lives in [place] and eating ? N N  Comment - lives in [place]? N N  In the past six months, have you accidently leaked urine? - N  Do you have problems with loss of bowel control? - N  Managing your Medications? - N  Comment - lives in [place]  Managing your Finances? - N  Comment - lives in [place] or managing your Housekeeping? - N  Comment - lives in [place] recent data might be hidden    Patient Care Team: [name], MD as PCP - General (Internal Medicine) Medina-Vargas, [name], NP as Nurse Practitioner (Internal Medicine)  Indicate any recent Medical Services you may have received from other than [facility] providers in the past year (date may be approximate).     Assessment:   This is a routine wellness examination for [place].  Hearing/Vision screen  Hearing Screening   125Hz  250Hz  500Hz  1000Hz  2000Hz  3000Hz  4000Hz  6000Hz  8000Hz   Right ear:           Left ear:           Comments: Not done  Vision Screening Comments: Saw Dr. Katy Fitch on 05/22/19  Dietary issues and exercise activities discussed: Current Exercise Habits: Structured exercise class, Type of exercise: stretching, Time (Minutes): 15, Frequency (Times/Week): 3, Weekly Exercise (Minutes/Week): 45, Intensity: Mild, Exercise limited by: cardiac condition(s);respiratory conditions(s)  Goals    .  DIET - EAT MORE FRUITS AND VEGETABLES (pt-stated)      Eat more fresh fruits and vegetables       Depression Screen PHQ 2/9 Scores 11/05/2019 11/09/2017 11/08/2016  PHQ - 2 Score 0 4 4  PHQ- 9 Score - 10 11    Fall Risk Fall Risk   11/05/2019 10/24/2018 07/19/2018 01/17/2018 11/09/2017  Falls in the past year? 1 0 0 0 No  Number falls in past yr: 0 0 0 0 -  Injury with Fall? 0 0 0 0 -  Comment - - - - -  Risk Factor Category  - - - - -  Risk for fall due to : History of fall(s);Impaired balance/gait;Impaired mobility - - - -  Follow up Falls evaluation completed;Education provided - - Falls evaluation completed -    Any stairs in or around  the home? No  If so, are there any without handrails? Yes  Home free of loose throw rugs in walkways, pet beds, electrical cords, etc? Yes  Adequate lighting in your home to reduce risk of falls? Yes   ASSISTIVE DEVICES UTILIZED TO PREVENT FALLS:  Life alert? No  Use of a cane, walker or w/c? Yes  Grab bars in the bathroom? Yes  Shower chair or bench in shower? Yes  Elevated toilet seat or a handicapped toilet? Yes   TIMED UP AND GO:  Was the test performed? No  Length of time to ambulate N/A Gait unsteady with use of assistive device, provider informed and education provided.   Cognitive Function: MMSE - Mini Mental State Exam 11/05/2019  Orientation to time 5  Orientation to Place 5  Registration 3  Attention/ Calculation 5  Recall 3  Language- name 2 objects 2  Language- repeat 1  Language- follow 3 step command 3  Language- read & follow direction 1  Write a sentence 1  Copy design 1  Total score 30     6CIT Screen 11/09/2017 11/08/2016  What Year? 0 points 0 points  What month? 0 points 0 points  What time? 0 points 0 points  Count back from 20 0 points 0 points  Months in reverse 2 points 0 points  Repeat phrase 4 points 2 points  Total Score 6 2    Immunizations Immunization History  Administered Date(s) Administered  . Influenza, High Dose Seasonal PF 10/04/2016  . Influenza,inj,Quad PF,6+ Mos 01/13/2016  . Influenza-Unspecified 11/10/2017, 10/09/2018  . Moderna SARS-COVID-2 Vaccination 01/28/2019, 02/25/2019  . Pneumococcal-Unspecified  10/11/2011  . Tdap 01/23/2016    TDAP status: Up to date Flu Vaccine status: Up to date Pneumococcal vaccine status: Up to date Covid-19 vaccine status: Completed vaccines  Qualifies for Shingles Vaccine? Yes   Zostavax completed No   Shingrix Completed?: No.    Education has been provided regarding the importance of this vaccine. Patient has been advised to call insurance company to determine out of pocket expense if they have not yet received this vaccine. Advised may also receive vaccine at local pharmacy or Health Dept. Verbalized acceptance and understanding.  Screening Tests Health Maintenance  Topic Date Due  . INFLUENZA VACCINE  04/09/2020 (Originally 08/11/2019)  . TETANUS/TDAP  01/22/2026  . COVID-19 Vaccine  Completed  . PNA vac Low Risk Adult  Completed  . DEXA SCAN  Discontinued    Health Maintenance  There are no preventive care reminders to display for this patient.  Colorectal cancer screening: No longer required.  Mammogram status: No longer required. Does not want to have it done Bone Density status: Completed ordered. Results reflect: Bone density results: NORMAL. Repeat every will order years.  Lung Cancer Screening: (Low Dose CT Chest recommended if Age 14-80 years, 30 pack-year currently smoking OR have quit w/in 15years.) does not qualify.   Lung Cancer Screening Referral: No  Additional Screening:  Hepatitis C Screening: does qualify; Completed will order  Vision Screening: Recommended annual ophthalmology exams for early detection of glaucoma and other disorders of the eye. Is the patient up to date with their annual eye exam?  Yes  Who is the provider or what is the name of the office in which the patient attends annual eye exams? 05/22/19 If pt is not established with a provider, would they like to be referred to a provider to establish care? Yes .   Dental Screening: Recommended annual dental exams  for proper oral hygiene  Community Resource  Referral / Chronic Care Management: CRR required this visit?  Yes   CCM required this visit?  Yes      Plan:     I have personally reviewed and noted the following in the patient's chart:   . Medical and social history . Use of alcohol, tobacco or illicit drugs  . Current medications and supplements . Functional ability and status . Nutritional status . Physical activity . Advanced directives . List of other physicians . Hospitalizations, surgeries, and ER visits in previous 12 months . Vitals . Screenings to include cognitive, depression, and falls . Referrals and appointments  In addition, I have reviewed and discussed with patient certain preventive protocols, quality metrics, and best practice recommendations. A written personalized care plan for preventive services as well as general preventive health recommendations were provided to patient.     Devarius Nelles Medina-Vargas, NP   11/05/2019

## 2019-11-06 DIAGNOSIS — M6281 Muscle weakness (generalized): Secondary | ICD-10-CM | POA: Diagnosis not present

## 2019-11-06 DIAGNOSIS — J449 Chronic obstructive pulmonary disease, unspecified: Secondary | ICD-10-CM | POA: Diagnosis not present

## 2019-11-06 DIAGNOSIS — M542 Cervicalgia: Secondary | ICD-10-CM | POA: Diagnosis not present

## 2019-11-06 DIAGNOSIS — E0590 Thyrotoxicosis, unspecified without thyrotoxic crisis or storm: Secondary | ICD-10-CM | POA: Diagnosis not present

## 2019-11-06 DIAGNOSIS — M4802 Spinal stenosis, cervical region: Secondary | ICD-10-CM | POA: Diagnosis not present

## 2019-11-07 DIAGNOSIS — M6281 Muscle weakness (generalized): Secondary | ICD-10-CM | POA: Diagnosis not present

## 2019-11-07 DIAGNOSIS — M542 Cervicalgia: Secondary | ICD-10-CM | POA: Diagnosis not present

## 2019-11-07 DIAGNOSIS — J449 Chronic obstructive pulmonary disease, unspecified: Secondary | ICD-10-CM | POA: Diagnosis not present

## 2019-11-07 DIAGNOSIS — M4802 Spinal stenosis, cervical region: Secondary | ICD-10-CM | POA: Diagnosis not present

## 2019-11-09 DIAGNOSIS — M542 Cervicalgia: Secondary | ICD-10-CM | POA: Diagnosis not present

## 2019-11-09 DIAGNOSIS — M4802 Spinal stenosis, cervical region: Secondary | ICD-10-CM | POA: Diagnosis not present

## 2019-11-09 DIAGNOSIS — J449 Chronic obstructive pulmonary disease, unspecified: Secondary | ICD-10-CM | POA: Diagnosis not present

## 2019-11-09 DIAGNOSIS — M6281 Muscle weakness (generalized): Secondary | ICD-10-CM | POA: Diagnosis not present

## 2019-11-11 DIAGNOSIS — M810 Age-related osteoporosis without current pathological fracture: Secondary | ICD-10-CM | POA: Diagnosis not present

## 2019-11-11 DIAGNOSIS — M6281 Muscle weakness (generalized): Secondary | ICD-10-CM | POA: Diagnosis not present

## 2019-11-11 DIAGNOSIS — M4802 Spinal stenosis, cervical region: Secondary | ICD-10-CM | POA: Diagnosis not present

## 2019-11-11 DIAGNOSIS — M542 Cervicalgia: Secondary | ICD-10-CM | POA: Diagnosis not present

## 2019-11-12 DIAGNOSIS — M6281 Muscle weakness (generalized): Secondary | ICD-10-CM | POA: Diagnosis not present

## 2019-11-12 DIAGNOSIS — M4802 Spinal stenosis, cervical region: Secondary | ICD-10-CM | POA: Diagnosis not present

## 2019-11-12 DIAGNOSIS — R0781 Pleurodynia: Secondary | ICD-10-CM | POA: Diagnosis not present

## 2019-11-12 DIAGNOSIS — M542 Cervicalgia: Secondary | ICD-10-CM | POA: Diagnosis not present

## 2019-11-13 ENCOUNTER — Other Ambulatory Visit: Payer: Self-pay | Admitting: Adult Health

## 2019-11-13 DIAGNOSIS — F411 Generalized anxiety disorder: Secondary | ICD-10-CM | POA: Diagnosis not present

## 2019-11-13 DIAGNOSIS — G4700 Insomnia, unspecified: Secondary | ICD-10-CM | POA: Diagnosis not present

## 2019-11-13 DIAGNOSIS — G894 Chronic pain syndrome: Secondary | ICD-10-CM

## 2019-11-13 DIAGNOSIS — M6281 Muscle weakness (generalized): Secondary | ICD-10-CM | POA: Diagnosis not present

## 2019-11-13 DIAGNOSIS — M4802 Spinal stenosis, cervical region: Secondary | ICD-10-CM | POA: Diagnosis not present

## 2019-11-13 DIAGNOSIS — M542 Cervicalgia: Secondary | ICD-10-CM | POA: Diagnosis not present

## 2019-11-13 DIAGNOSIS — F331 Major depressive disorder, recurrent, moderate: Secondary | ICD-10-CM | POA: Diagnosis not present

## 2019-11-13 MED ORDER — HYDROCODONE-ACETAMINOPHEN 5-325 MG PO TABS: ORAL_TABLET | ORAL | 0 refills | Status: DC

## 2019-11-14 ENCOUNTER — Encounter (HOSPITAL_COMMUNITY): Payer: Self-pay

## 2019-11-14 ENCOUNTER — Other Ambulatory Visit: Payer: Self-pay

## 2019-11-14 ENCOUNTER — Inpatient Hospital Stay (HOSPITAL_COMMUNITY)
Admission: EM | Admit: 2019-11-14 | Discharge: 2019-11-19 | DRG: 436 | Disposition: A | Discharge status: Skilled Nursing Facility | Payer: Medicare Other | Source: Skilled Nursing Facility | Attending: Family Medicine | Admitting: Family Medicine

## 2019-11-14 ENCOUNTER — Emergency Department (HOSPITAL_COMMUNITY): Payer: Medicare Other

## 2019-11-14 DIAGNOSIS — I700 Atherosclerosis of aorta: Secondary | ICD-10-CM | POA: Diagnosis present

## 2019-11-14 DIAGNOSIS — I5032 Chronic diastolic (congestive) heart failure: Secondary | ICD-10-CM | POA: Diagnosis present

## 2019-11-14 DIAGNOSIS — Z888 Allergy status to other drugs, medicaments and biological substances status: Secondary | ICD-10-CM

## 2019-11-14 DIAGNOSIS — R918 Other nonspecific abnormal finding of lung field: Secondary | ICD-10-CM | POA: Diagnosis not present

## 2019-11-14 DIAGNOSIS — R400 Somnolence: Secondary | ICD-10-CM

## 2019-11-14 DIAGNOSIS — M549 Dorsalgia, unspecified: Secondary | ICD-10-CM

## 2019-11-14 DIAGNOSIS — Z87440 Personal history of urinary (tract) infections: Secondary | ICD-10-CM

## 2019-11-14 DIAGNOSIS — Z833 Family history of diabetes mellitus: Secondary | ICD-10-CM

## 2019-11-14 DIAGNOSIS — Z8249 Family history of ischemic heart disease and other diseases of the circulatory system: Secondary | ICD-10-CM

## 2019-11-14 DIAGNOSIS — Z803 Family history of malignant neoplasm of breast: Secondary | ICD-10-CM

## 2019-11-14 DIAGNOSIS — C787 Secondary malignant neoplasm of liver and intrahepatic bile duct: Principal | ICD-10-CM | POA: Diagnosis present

## 2019-11-14 DIAGNOSIS — D500 Iron deficiency anemia secondary to blood loss (chronic): Secondary | ICD-10-CM | POA: Diagnosis not present

## 2019-11-14 DIAGNOSIS — D509 Iron deficiency anemia, unspecified: Principal | ICD-10-CM

## 2019-11-14 DIAGNOSIS — Z79899 Other long term (current) drug therapy: Secondary | ICD-10-CM

## 2019-11-14 DIAGNOSIS — N179 Acute kidney failure, unspecified: Secondary | ICD-10-CM | POA: Diagnosis not present

## 2019-11-14 DIAGNOSIS — F17210 Nicotine dependence, cigarettes, uncomplicated: Secondary | ICD-10-CM | POA: Diagnosis present

## 2019-11-14 DIAGNOSIS — Z9981 Dependence on supplemental oxygen: Secondary | ICD-10-CM

## 2019-11-14 DIAGNOSIS — Z79890 Hormone replacement therapy (postmenopausal): Secondary | ICD-10-CM

## 2019-11-14 DIAGNOSIS — J9611 Chronic respiratory failure with hypoxia: Secondary | ICD-10-CM

## 2019-11-14 DIAGNOSIS — F419 Anxiety disorder, unspecified: Secondary | ICD-10-CM | POA: Diagnosis present

## 2019-11-14 DIAGNOSIS — K219 Gastro-esophageal reflux disease without esophagitis: Secondary | ICD-10-CM | POA: Diagnosis present

## 2019-11-14 DIAGNOSIS — R52 Pain, unspecified: Secondary | ICD-10-CM | POA: Diagnosis not present

## 2019-11-14 DIAGNOSIS — I130 Hypertensive heart and chronic kidney disease with heart failure and stage 1 through stage 4 chronic kidney disease, or unspecified chronic kidney disease: Secondary | ICD-10-CM | POA: Diagnosis not present

## 2019-11-14 DIAGNOSIS — Z20822 Contact with and (suspected) exposure to covid-19: Secondary | ICD-10-CM | POA: Diagnosis present

## 2019-11-14 DIAGNOSIS — I471 Supraventricular tachycardia: Secondary | ICD-10-CM | POA: Diagnosis not present

## 2019-11-14 DIAGNOSIS — C3490 Malignant neoplasm of unspecified part of unspecified bronchus or lung: Secondary | ICD-10-CM | POA: Diagnosis present

## 2019-11-14 DIAGNOSIS — I701 Atherosclerosis of renal artery: Secondary | ICD-10-CM | POA: Diagnosis not present

## 2019-11-14 DIAGNOSIS — C7951 Secondary malignant neoplasm of bone: Secondary | ICD-10-CM | POA: Diagnosis not present

## 2019-11-14 DIAGNOSIS — J439 Emphysema, unspecified: Secondary | ICD-10-CM | POA: Diagnosis not present

## 2019-11-14 DIAGNOSIS — N281 Cyst of kidney, acquired: Secondary | ICD-10-CM | POA: Diagnosis not present

## 2019-11-14 DIAGNOSIS — N183 Chronic kidney disease, stage 3 unspecified: Secondary | ICD-10-CM | POA: Diagnosis present

## 2019-11-14 DIAGNOSIS — F32A Depression, unspecified: Secondary | ICD-10-CM | POA: Diagnosis present

## 2019-11-14 DIAGNOSIS — Z7982 Long term (current) use of aspirin: Secondary | ICD-10-CM

## 2019-11-14 DIAGNOSIS — Z96641 Presence of right artificial hip joint: Secondary | ICD-10-CM | POA: Diagnosis present

## 2019-11-14 DIAGNOSIS — D630 Anemia in neoplastic disease: Secondary | ICD-10-CM | POA: Diagnosis present

## 2019-11-14 DIAGNOSIS — J449 Chronic obstructive pulmonary disease, unspecified: Secondary | ICD-10-CM | POA: Diagnosis not present

## 2019-11-14 DIAGNOSIS — G894 Chronic pain syndrome: Secondary | ICD-10-CM | POA: Diagnosis present

## 2019-11-14 DIAGNOSIS — C799 Secondary malignant neoplasm of unspecified site: Secondary | ICD-10-CM

## 2019-11-14 DIAGNOSIS — Z8781 Personal history of (healed) traumatic fracture: Secondary | ICD-10-CM

## 2019-11-14 DIAGNOSIS — D638 Anemia in other chronic diseases classified elsewhere: Secondary | ICD-10-CM | POA: Diagnosis present

## 2019-11-14 DIAGNOSIS — I10 Essential (primary) hypertension: Secondary | ICD-10-CM | POA: Diagnosis present

## 2019-11-14 DIAGNOSIS — K7689 Other specified diseases of liver: Secondary | ICD-10-CM | POA: Diagnosis not present

## 2019-11-14 DIAGNOSIS — E0590 Thyrotoxicosis, unspecified without thyrotoxic crisis or storm: Secondary | ICD-10-CM | POA: Diagnosis present

## 2019-11-14 DIAGNOSIS — R296 Repeated falls: Secondary | ICD-10-CM | POA: Diagnosis present

## 2019-11-14 DIAGNOSIS — E039 Hypothyroidism, unspecified: Secondary | ICD-10-CM | POA: Diagnosis present

## 2019-11-14 DIAGNOSIS — I959 Hypotension, unspecified: Secondary | ICD-10-CM | POA: Diagnosis not present

## 2019-11-14 HISTORY — DX: Dorsalgia, unspecified: M54.9

## 2019-11-14 HISTORY — DX: Secondary malignant neoplasm of unspecified site: C79.9

## 2019-11-14 LAB — CBC WITH DIFFERENTIAL/PLATELET
Abs Immature Granulocytes: 0.04 10*3/uL (ref 0.00–0.07)
Basophils Absolute: 0.1 10*3/uL (ref 0.0–0.1)
Basophils Relative: 1 %
Eosinophils Absolute: 0.1 10*3/uL (ref 0.0–0.5)
Eosinophils Relative: 1 %
HCT: 24.1 % — ABNORMAL LOW (ref 36.0–46.0)
Hemoglobin: 7.3 g/dL — ABNORMAL LOW (ref 12.0–15.0)
Immature Granulocytes: 0 %
Lymphocytes Relative: 6 %
Lymphs Abs: 0.6 10*3/uL — ABNORMAL LOW (ref 0.7–4.0)
MCH: 28.1 pg (ref 26.0–34.0)
MCHC: 30.3 g/dL (ref 30.0–36.0)
MCV: 92.7 fL (ref 80.0–100.0)
Monocytes Absolute: 0.8 10*3/uL (ref 0.1–1.0)
Monocytes Relative: 8 %
Neutro Abs: 7.6 10*3/uL (ref 1.7–7.7)
Neutrophils Relative %: 84 %
Platelets: 288 10*3/uL (ref 150–400)
RBC: 2.6 MIL/uL — ABNORMAL LOW (ref 3.87–5.11)
RDW: 14.5 % (ref 11.5–15.5)
WBC: 9.1 10*3/uL (ref 4.0–10.5)
nRBC: 0 % (ref 0.0–0.2)

## 2019-11-14 LAB — URINALYSIS, ROUTINE W REFLEX MICROSCOPIC
Glucose, UA: NEGATIVE mg/dL
Hgb urine dipstick: NEGATIVE
Ketones, ur: 5 mg/dL — AB
Leukocytes,Ua: NEGATIVE
Nitrite: NEGATIVE
Protein, ur: NEGATIVE mg/dL
Specific Gravity, Urine: 1.023 (ref 1.005–1.030)
pH: 5 (ref 5.0–8.0)

## 2019-11-14 LAB — COMPREHENSIVE METABOLIC PANEL
ALT: 20 U/L (ref 0–44)
AST: 30 U/L (ref 15–41)
Albumin: 3.6 g/dL (ref 3.5–5.0)
Alkaline Phosphatase: 122 U/L (ref 38–126)
Anion gap: 12 (ref 5–15)
BUN: 22 mg/dL (ref 8–23)
CO2: 27 mmol/L (ref 22–32)
Calcium: 9.1 mg/dL (ref 8.9–10.3)
Chloride: 91 mmol/L — ABNORMAL LOW (ref 98–111)
Creatinine, Ser: 1.26 mg/dL — ABNORMAL HIGH (ref 0.44–1.00)
GFR, Estimated: 43 mL/min — ABNORMAL LOW (ref >60)
Glucose, Bld: 102 mg/dL — ABNORMAL HIGH (ref 70–99)
Potassium: 4.6 mmol/L (ref 3.5–5.1)
Sodium: 130 mmol/L — ABNORMAL LOW (ref 135–145)
Total Bilirubin: 0.4 mg/dL (ref 0.3–1.2)
Total Protein: 6.8 g/dL (ref 6.5–8.1)

## 2019-11-14 LAB — TROPONIN I (HIGH SENSITIVITY)
Troponin I (High Sensitivity): 5 ng/L (ref <18)
Troponin I (High Sensitivity): 5 ng/L (ref <18)

## 2019-11-14 LAB — IRON AND TIBC
Iron: 17 ug/dL — ABNORMAL LOW (ref 28–170)
Saturation Ratios: 5 % — ABNORMAL LOW (ref 10.4–31.8)
TIBC: 321 ug/dL (ref 250–450)
UIBC: 304 ug/dL

## 2019-11-14 LAB — VITAMIN B12: Vitamin B-12: 355 pg/mL (ref 180–914)

## 2019-11-14 LAB — I-STAT CHEM 8, ED
BUN: 23 mg/dL (ref 8–23)
Calcium, Ion: 1.2 mmol/L (ref 1.15–1.40)
Chloride: 91 mmol/L — ABNORMAL LOW (ref 98–111)
Creatinine, Ser: 1.4 mg/dL — ABNORMAL HIGH (ref 0.44–1.00)
Glucose, Bld: 100 mg/dL — ABNORMAL HIGH (ref 70–99)
HCT: 22 % — ABNORMAL LOW (ref 36.0–46.0)
Hemoglobin: 7.5 g/dL — ABNORMAL LOW (ref 12.0–15.0)
Potassium: 4.6 mmol/L (ref 3.5–5.1)
Sodium: 131 mmol/L — ABNORMAL LOW (ref 135–145)
TCO2: 32 mmol/L (ref 22–32)

## 2019-11-14 LAB — RESPIRATORY PANEL BY RT PCR (FLU A&B, COVID)
Influenza A by PCR: NEGATIVE
Influenza B by PCR: NEGATIVE
SARS Coronavirus 2 by RT PCR: NEGATIVE

## 2019-11-14 LAB — FOLATE: Folate: 22.9 ng/mL (ref >5.9)

## 2019-11-14 LAB — FERRITIN: Ferritin: 26 ng/mL (ref 11–307)

## 2019-11-14 LAB — DIGOXIN LEVEL: Digoxin Level: 0.8 ng/mL — ABNORMAL LOW (ref 1.0–2.0)

## 2019-11-14 MED ORDER — DIGOXIN 125 MCG PO TABS
0.1250 mg | ORAL_TABLET | Freq: Every day | ORAL | Status: DC
  Administered 2019-11-14 – 2019-11-19 (×4): 0.125 mg via ORAL
  Filled 2019-11-14 (×6): qty 1

## 2019-11-14 MED ORDER — MULTI-VITAMIN/MINERALS PO TABS: 1.0000 | ORAL_TABLET | Freq: Every day | ORAL | Status: DC

## 2019-11-14 MED ORDER — SENNA-DOCUSATE SODIUM 8.6-50 MG PO TABS: 1.0000 | ORAL_TABLET | Freq: Two times a day (BID) | ORAL | Status: DC

## 2019-11-14 MED ORDER — FLUTICASONE PROPIONATE 50 MCG/ACT NA SUSP
1.0000 | Freq: Every day | NASAL | Status: DC
  Administered 2019-11-15 – 2019-11-19 (×5): 1 via NASAL
  Filled 2019-11-14: qty 16

## 2019-11-14 MED ORDER — PANTOPRAZOLE SODIUM 40 MG PO TBEC
40.0000 mg | DELAYED_RELEASE_TABLET | Freq: Every day | ORAL | Status: DC
  Administered 2019-11-14 – 2019-11-19 (×6): 40 mg via ORAL
  Filled 2019-11-14 (×6): qty 1

## 2019-11-14 MED ORDER — LORATADINE 10 MG PO TABS
10.0000 mg | ORAL_TABLET | Freq: Every day | ORAL | Status: DC
  Administered 2019-11-14 – 2019-11-19 (×6): 10 mg via ORAL
  Filled 2019-11-14 (×6): qty 1

## 2019-11-14 MED ORDER — FENTANYL CITRATE (PF) 100 MCG/2ML IJ SOLN
25.0000 ug | Freq: Once | INTRAMUSCULAR | Status: AC
  Administered 2019-11-14: 25 ug via INTRAVENOUS
  Filled 2019-11-14: qty 2

## 2019-11-14 MED ORDER — ADULT MULTIVITAMIN W/MINERALS CH
1.0000 | ORAL_TABLET | Freq: Every day | ORAL | Status: DC
  Administered 2019-11-14 – 2019-11-19 (×6): 1 via ORAL
  Filled 2019-11-14 (×6): qty 1

## 2019-11-14 MED ORDER — SENNOSIDES-DOCUSATE SODIUM 8.6-50 MG PO TABS
1.0000 | ORAL_TABLET | Freq: Two times a day (BID) | ORAL | Status: DC
  Administered 2019-11-14 – 2019-11-19 (×10): 1 via ORAL
  Filled 2019-11-14 (×10): qty 1

## 2019-11-14 MED ORDER — FLECAINIDE ACETATE 50 MG PO TABS
50.0000 mg | ORAL_TABLET | Freq: Two times a day (BID) | ORAL | Status: DC
  Administered 2019-11-14 – 2019-11-19 (×11): 50 mg via ORAL
  Filled 2019-11-14 (×13): qty 1

## 2019-11-14 MED ORDER — TRAZODONE HCL 50 MG PO TABS
25.0000 mg | ORAL_TABLET | Freq: Every day | ORAL | Status: DC
  Administered 2019-11-14 – 2019-11-18 (×5): 25 mg via ORAL
  Filled 2019-11-14 (×5): qty 1

## 2019-11-14 MED ORDER — PRIMIDONE 50 MG PO TABS
100.0000 mg | ORAL_TABLET | Freq: Every day | ORAL | Status: DC
  Administered 2019-11-14 – 2019-11-18 (×5): 100 mg via ORAL
  Filled 2019-11-14 (×7): qty 2

## 2019-11-14 MED ORDER — FENTANYL CITRATE (PF) 100 MCG/2ML IJ SOLN: 25.0000 ug | Freq: Once | INTRAMUSCULAR | Status: AC

## 2019-11-14 MED ORDER — SODIUM CHLORIDE 0.9% FLUSH: 3.0000 mL | INTRAVENOUS | Status: DC | PRN

## 2019-11-14 MED ORDER — POLYETHYLENE GLYCOL 3350 17 G PO PACK
17.0000 g | PACK | Freq: Every day | ORAL | Status: DC
  Administered 2019-11-15 – 2019-11-17 (×3): 17 g via ORAL
  Filled 2019-11-14 (×4): qty 1

## 2019-11-14 MED ORDER — SODIUM CHLORIDE 0.9 % IV SOLN
250.0000 mL | INTRAVENOUS | Status: DC | PRN
  Administered 2019-11-14: 250 mL via INTRAVENOUS

## 2019-11-14 MED ORDER — FENTANYL CITRATE (PF) 100 MCG/2ML IJ SOLN
INTRAMUSCULAR | Status: AC
  Administered 2019-11-14: 25 ug via INTRAVENOUS
  Filled 2019-11-14: qty 2

## 2019-11-14 MED ORDER — SODIUM CHLORIDE 0.9% FLUSH
3.0000 mL | Freq: Two times a day (BID) | INTRAVENOUS | Status: DC
  Administered 2019-11-14 – 2019-11-19 (×10): 3 mL via INTRAVENOUS

## 2019-11-14 MED ORDER — ENOXAPARIN SODIUM 40 MG/0.4ML ~~LOC~~ SOLN: 40.0000 mg | SUBCUTANEOUS | Status: DC

## 2019-11-14 MED ORDER — DULOXETINE HCL 60 MG PO CPEP
120.0000 mg | ORAL_CAPSULE | Freq: Every day | ORAL | Status: DC
  Administered 2019-11-14 – 2019-11-19 (×6): 120 mg via ORAL
  Filled 2019-11-14 (×6): qty 2

## 2019-11-14 MED ORDER — ONDANSETRON HCL 4 MG/2ML IJ SOLN: 4.0000 mg | Freq: Four times a day (QID) | INTRAMUSCULAR | Status: DC | PRN

## 2019-11-14 MED ORDER — GUAIFENESIN ER 600 MG PO TB12
600.0000 mg | ORAL_TABLET | Freq: Two times a day (BID) | ORAL | Status: DC
  Administered 2019-11-14 – 2019-11-19 (×10): 600 mg via ORAL
  Filled 2019-11-14 (×10): qty 1

## 2019-11-14 MED ORDER — HYDROMORPHONE HCL 1 MG/ML IJ SOLN
1.0000 mg | Freq: Once | INTRAMUSCULAR | Status: AC
  Administered 2019-11-14: 1 mg via INTRAVENOUS
  Filled 2019-11-14: qty 1

## 2019-11-14 MED ORDER — LEVOTHYROXINE SODIUM 25 MCG PO TABS
25.0000 ug | ORAL_TABLET | Freq: Every day | ORAL | Status: DC
  Administered 2019-11-15 – 2019-11-19 (×5): 25 ug via ORAL
  Filled 2019-11-14 (×5): qty 1

## 2019-11-14 MED ORDER — ASPIRIN 81 MG PO CHEW
81.0000 mg | CHEWABLE_TABLET | Freq: Every day | ORAL | Status: DC
  Administered 2019-11-14 – 2019-11-19 (×6): 81 mg via ORAL
  Filled 2019-11-14 (×6): qty 1

## 2019-11-14 MED ORDER — UMECLIDINIUM BROMIDE 62.5 MCG/INH IN AEPB
1.0000 | INHALATION_SPRAY | Freq: Every day | RESPIRATORY_TRACT | Status: DC
  Administered 2019-11-15 – 2019-11-19 (×4): 1 via RESPIRATORY_TRACT
  Filled 2019-11-14: qty 7

## 2019-11-14 MED ORDER — CALCIUM CARBONATE ANTACID 500 MG PO CHEW: 2.0000 | CHEWABLE_TABLET | Freq: Three times a day (TID) | ORAL | Status: DC | PRN

## 2019-11-14 MED ORDER — PROPRANOLOL HCL ER 60 MG PO CP24
60.0000 mg | ORAL_CAPSULE | Freq: Every day | ORAL | Status: DC
  Administered 2019-11-14 – 2019-11-18 (×5): 60 mg via ORAL
  Filled 2019-11-14 (×6): qty 1

## 2019-11-14 MED ORDER — PREDNISONE 5 MG PO TABS
5.0000 mg | ORAL_TABLET | Freq: Every day | ORAL | Status: DC
  Administered 2019-11-15 – 2019-11-19 (×5): 5 mg via ORAL
  Filled 2019-11-14 (×6): qty 1

## 2019-11-14 MED ORDER — FENTANYL CITRATE (PF) 100 MCG/2ML IJ SOLN
50.0000 ug | Freq: Once | INTRAMUSCULAR | Status: AC
  Administered 2019-11-14: 50 ug via INTRAVENOUS
  Filled 2019-11-14: qty 2

## 2019-11-14 MED ORDER — IOHEXOL 350 MG/ML SOLN
80.0000 mL | Freq: Once | INTRAVENOUS | Status: AC | PRN
  Administered 2019-11-14: 80 mL via INTRAVENOUS

## 2019-11-14 MED ORDER — ONDANSETRON HCL 4 MG PO TABS: 4.0000 mg | ORAL_TABLET | Freq: Four times a day (QID) | ORAL | Status: DC | PRN

## 2019-11-14 MED ORDER — POLYVINYL ALCOHOL 1.4 % OP SOLN
2.0000 [drp] | Freq: Three times a day (TID) | OPHTHALMIC | Status: DC
  Administered 2019-11-14 – 2019-11-19 (×13): 2 [drp] via OPHTHALMIC
  Filled 2019-11-14: qty 15

## 2019-11-14 MED ORDER — ALBUTEROL SULFATE (2.5 MG/3ML) 0.083% IN NEBU: 2.5000 mg | INHALATION_SOLUTION | Freq: Four times a day (QID) | RESPIRATORY_TRACT | Status: DC | PRN

## 2019-11-14 MED ORDER — FUROSEMIDE 20 MG PO TABS
20.0000 mg | ORAL_TABLET | ORAL | Status: DC
  Administered 2019-11-15: 20 mg via ORAL
  Filled 2019-11-14 (×2): qty 1

## 2019-11-14 MED ORDER — HYDROMORPHONE HCL 1 MG/ML IJ SOLN
1.0000 mg | INTRAMUSCULAR | Status: DC | PRN
  Administered 2019-11-14 – 2019-11-18 (×5): 1 mg via INTRAVENOUS
  Filled 2019-11-14 (×5): qty 1

## 2019-11-14 MED ORDER — ACETAMINOPHEN 325 MG PO TABS: 650.0000 mg | ORAL_TABLET | Freq: Four times a day (QID) | ORAL | Status: DC | PRN

## 2019-11-14 NOTE — Plan of Care (Signed)
  Problem: Education: Goal: Knowledge of disease and its progression will improve Outcome: Progressing Goal: Individualized Educational Video(s) Outcome: Progressing   Problem: Fluid Volume: Goal: Compliance with measures to maintain balanced fluid volume will improve Outcome: Progressing   Problem: Health Behavior/Discharge Planning: Goal: Ability to manage health-related needs will improve Outcome: Progressing   Problem: Nutritional: Goal: Ability to make healthy dietary choices will improve Outcome: Progressing   Problem: Clinical Measurements: Goal: Complications related to the disease process, condition or treatment will be avoided or minimized Outcome: Progressing   Problem: Education: Goal: Knowledge of disease or condition will improve Outcome: Progressing Goal: Knowledge of the prescribed therapeutic regimen will improve Outcome: Progressing Goal: Individualized Educational Video(s) Outcome: Progressing   Problem: Activity: Goal: Ability to tolerate increased activity will improve Outcome: Progressing Goal: Will verbalize the importance of balancing activity with adequate rest periods Outcome: Progressing   Problem: Respiratory: Goal: Ability to maintain a clear airway will improve Outcome: Progressing Goal: Levels of oxygenation will improve Outcome: Progressing Goal: Ability to maintain adequate ventilation will improve Outcome: Progressing   Problem: Education: Goal: Knowledge of the prescribed therapeutic regimen will improve Outcome: Progressing   Problem: Activity: Goal: Ability to implement measures to reduce episodes of fatigue will improve Outcome: Progressing   Problem: Bowel/Gastric: Goal: Will not experience complications related to bowel motility Outcome: Progressing   Problem: Coping: Goal: Ability to identify and develop effective coping behavior will improve Outcome: Progressing   Problem: Nutritional: Goal: Maintenance of adequate  nutrition will improve Outcome: Progressing   Problem: Nutrition Goal: Patient maintains adequate hydration Outcome: Progressing Goal: Patient maintains weight Outcome: Progressing Goal: Patient/Family demonstrates understanding of diet Outcome: Progressing Goal: Patient/Family independently completes tube feeding Outcome: Progressing Goal: Patient will have no more than 5 lb weight change during LOS Outcome: Progressing Goal: Patient will utilize adaptive techniques to administer nutrition Outcome: Progressing Goal: Patient will verbalize dietary restrictions Outcome: Progressing

## 2019-11-14 NOTE — Consult Note (Signed)
Chief Complaint: Patient was seen in consultation today for liver lesion biopsy.  Referring Physician(s): Mikey Bussing, NP  Supervising Physician: Daryll Brod  Patient Status: [facility] - ED  History of Present Illness: Dawn [name] is a 79 y.o. female with a past medical history significant for anxiety, depression, GERD, hypothyroidism, COPD on chronic supplemental oxygen, CKD III, CHF, anemia, HTN, polyneuropathy, spinal stenosis and chronic pain syndrome who presented to the ED today from [facility] with complaints of severe lower thoracic/upper lumbar spine pain. CTA chest/abd/pelvis showed a 3 cm right middle lobe mass with right hilar adenopathy and extensive metastatic appearance in the liver. Further spine specific imaging has been ordered and is pending. Oncology was consulted and IR has been asked to perform a liver lesion biopsy to further direct care.   Dawn [name] seen in the ED with daughter at bedside. She tells me that her back pain has improved some with medication and laying still but worsens when she moves. She has had little appetite lately and has been constipated but otherwise has been at her baseline. She is aware of the imaging findings and is agreeable to proceed with biopsy.   Past Medical History:  Diagnosis Date  . Anemia   . Chronic diastolic heart failure ([place])   . Chronic kidney disease, stage 3 ([place])   . Chronic pain syndrome   . Constipation   . COPD (chronic obstructive pulmonary disease) ([place])   . Dysphagia   . Frequent falls   . Generalized muscle weakness   . GERD (gastroesophageal reflux disease)   . Headache   . Hip pain, right   . History of anxiety   . History of depression   . Hyperglycemia   . Hypothyroid   . Insomnia   . Metabolic encephalopathy   . Metastatic disease ([place]) 11/14/2019  . Palpitation   . Polyneuropathy   . Spinal stenosis   . Spinal stenosis, cervical region   . Supraventricular tachycardia ([place])   .  Thyrotoxicosis, unspecified with thyrotoxic crisis or storm   . Tremor   . Unsteadiness on feet   . UTI (urinary tract infection)     Past Surgical History:  Procedure Laterality Date  . ANKLE FRACTURE SURGERY Left   . CATARACT EXTRACTION, BILATERAL    . FLEXIBLE SIGMOIDOSCOPY Left 11/18/2015   Procedure: FLEXIBLE SIGMOIDOSCOPY;  Surgeon: [name], MD;  Location: [facility];  Service: Endoscopy;  Laterality: Left;  . FLEXIBLE SIGMOIDOSCOPY N/A 11/20/2015   Procedure: FLEXIBLE SIGMOIDOSCOPY;  Surgeon: [name], MD;  Location: [facility]COPY;  Service: Endoscopy;  Laterality: N/A;  . HIP ARTHROPLASTY Right 01/12/2016   Procedure: ARTHROPLASTY BIPOLAR HIP (HEMIARTHROPLASTY);  Surgeon: [name], MD;  Location: [facility];  Service: Orthopedics;  Laterality: Right;    Allergies: Biofreeze [menthol (topical analgesic)]  Medications: Prior to Admission medications   Medication Sig Start Date End Date Taking? Authorizing Provider  acetaminophen (TYLENOL) 325 MG tablet Take 650 mg by mouth every 6 (six) hours as needed.    Yes [provider]  albuterol (PROVENTIL) (2.5 MG/3ML) 0.083% nebulizer solution Take 2.5 mg by nebulization every 6 (six) hours as needed for wheezing or shortness of breath.   Yes [provider]  aspirin 81 MG chewable tablet Chew 81 mg by mouth daily.    Yes [provider]  calcium carbonate (TUMS - DOSED IN MG ELEMENTAL CALCIUM) 500 MG chewable tablet Chew 2 tablets by mouth every 8 (eight) hours as needed for indigestion or  heartburn.   Yes [provider]  digoxin (LANOXIN) 0.125 MG tablet Take 0.125 mg by mouth daily. Hold if HR <60   Yes [provider]  DULoxetine (CYMBALTA) 60 MG capsule Take 120 mg by mouth daily. For Depression   Yes [provider]  Eyelid Cleansers (STERILID EX) Apply 1 each topically in the morning. Apply to closed eyelids and gently rub it in then wipe it.   Yes [provider]    fexofenadine (ALLEGRA) 180 MG tablet Take 180 mg by mouth daily. 06/15/19  Yes [provider]  flecainide (TAMBOCOR) 50 MG tablet Take 50 mg by mouth 2 (two) times daily.   Yes [provider]  fluticasone (FLONASE ALLERGY RELIEF) 50 MCG/ACT nasal spray Place 1 spray into both nostrils daily.    Yes [provider]  furosemide (LASIX) 20 MG tablet Take 20 mg by mouth every Monday, Wednesday, and Friday.   Yes [provider]  guaiFENesin (MUCINEX) 600 MG 12 hr tablet Take 600 mg by mouth 2 (two) times daily. COPD cough   Yes [provider]  HYDROcodone-acetaminophen (NORCO/VICODIN) 5-325 MG tablet Give **1/2 tablet by mouth q 6 hrs PRN Patient taking differently: Take 0.5 tablets by mouth every 6 (six) hours as needed for moderate pain. Give **1/2 tablet by mouth q 6 hrs PRN  11/13/19  Yes Medina-Vargas, Monina C, NP  levothyroxine (SYNTHROID) 25 MCG tablet Take 25 mcg by mouth daily before breakfast.   Yes [provider]  Multiple Vitamins-Minerals (MULTIVITAMIN WITH MINERALS) tablet Take 1 tablet by mouth daily.   Yes [provider]  ondansetron (ZOFRAN) 4 MG tablet Take 4 mg by mouth every 8 (eight) hours as needed for nausea.    Yes [provider]  OXYGEN Inhale 2 L into the lungs daily as needed (oxygen).    Yes [provider]  pantoprazole (PROTONIX) 40 MG tablet Take 40 mg by mouth daily.   Yes [provider]  polyethylene glycol (MIRALAX / GLYCOLAX) packet Take 17 g by mouth daily.   Yes [provider]  polyvinyl alcohol (ARTIFICIAL TEARS) 1.4 % ophthalmic solution Place 2 drops into both eyes in the morning, at noon, in the evening, and at bedtime. For dry eyes    Yes [provider]  predniSONE (DELTASONE) 5 MG tablet Take 5 mg by mouth daily with breakfast. TAKE 1 TABLET BY MOUTH DAILY (WITH BREAKFAST) WG:NFAO   Yes [provider]  primidone (MYSOLINE) 50 MG tablet  Take 100 mg by mouth at bedtime.    Yes [provider]  propranolol ER (INDERAL LA) 60 MG 24 hr capsule Take 1 capsule (60 mg total) by mouth daily. 03/14/19  Yes Marcial Pacas, MD  sennosides-docusate sodium (SENOKOT-S) 8.6-50 MG tablet Take 1 tablet by mouth 2 (two) times daily.    Yes [provider]  traZODone (DESYREL) 50 MG tablet Take 1/2 tab =25 mg by mouth at bedtime Patient taking differently: Take 25 mg by mouth at bedtime. Take 1/2 tab =25 mg by mouth at bedtime 11/01/18  Yes Medina-Vargas, Monina C, NP  traZODone (DESYREL) 50 MG tablet Take 12.5 mg by mouth 2 (two) times daily. 1/4 tablet to = 12.5 mg 03/14/19  Yes [provider]  umeclidinium bromide (INCRUSE ELLIPTA) 62.5 MCG/INH AEPB Inhale 1 puff into the lungs daily. ([place])   Yes [provider]     Family History  Problem Relation Age of Onset  . Heart  attack Mother   . Diabetes Father   . Breast cancer Sister   . Thyroid disease Neg Hx     Social History   Socioeconomic History  . Marital status: Divorced    Spouse name: Not on file  . Number of children: 1  . Years of education: 11th grade  . Highest education level: Not on file  Occupational History  . 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 Smoking status: Former Smoker    Quit date: 12/11/2015    Years since quitting: 3.9  . Smokeless tobacco: Never Used  . Tobacco comment: Smoked age [age] up to < 1 ppd, usually half a pack per day, mainly one half pack per day  Vaping Use  . Vaping Use: Never used  Substance and Sexual Activity  . Alcohol use: No  . Drug use: No  . Sexual activity: Not Currently  Other Topics Concern  . Not on file  Social History Narrative   She is a long-term care resident of [facility].   Right handed.   Occasional caffeine.         Social Determinants of Health   Financial Resource Strain:   . Difficulty  of Paying Living Expenses: Not on file  Food Insecurity:   . Worried About Charity fundraiser in the Last Year: Not on file  . Ran Out of Food in the Last Year: Not on file  Transportation Needs:   . Lack of Transportation (Medical): Not on file  . Lack of Transportation (Non-Medical): Not on file  Physical Activity:   . Days of Exercise per Week: Not on file  . Minutes of Exercise per Session: Not on file  Stress:   . Feeling of Stress : Not on file  Social Connections:   . Frequency of Communication with Friends and Family: Not on file  . Frequency of Social Gatherings with Friends and Family: Not on file  . Attends Religious Services: Not on file  . Active Member of Clubs or Organizations: Not on file  . Attends Archivist Meetings: Not on file  . Marital Status: Not on file     Review of Systems: A 12 point ROS discussed and pertinent positives are indicated in the HPI above.  All other systems are negative.  Review of Systems  Constitutional: Positive for appetite change and fatigue. Negative for chills and fever.  Respiratory: Positive for shortness of breath (baseline - wears 3L oxygen continuous). Negative for cough.   Cardiovascular: Negative for chest pain.  Gastrointestinal: Positive for constipation. Negative for abdominal distention, abdominal pain, blood in stool, diarrhea, nausea and vomiting.  Musculoskeletal: Positive for back pain.  Skin: Negative for color change.  Neurological: Negative for headaches.    Vital Signs: BP 109/67   Pulse (!) 56   Temp 98.1 F (36.7 C)   Resp 18   SpO2 98%   Physical Exam Vitals and nursing note reviewed.  Constitutional:      General: She is not in acute distress. HENT:     Head: Normocephalic.     Mouth/Throat:     Mouth: Mucous membranes are dry.     Pharynx: Oropharynx is clear. No oropharyngeal exudate or posterior oropharyngeal erythema.  Cardiovascular:     Rate and Rhythm: Regular rhythm.  Bradycardia present.  Pulmonary:     Effort: Pulmonary effort is normal.  Comments: Decreased breath sounds bilaterally, anterior exam only 2/2 pain with movement. 2L O2 via [place]. Abdominal:     General: There is no distension.     Palpations: Abdomen is soft.     Tenderness: There is no abdominal tenderness.  Skin:    General: Skin is warm and dry.     Coloration: Skin is not jaundiced.  Neurological:     Mental Status: She is alert and oriented to person, place, and time.  Psychiatric:        Mood and Affect: Mood normal.        Behavior: Behavior normal.        Thought Content: Thought content normal.        Judgment: Judgment normal.      MD Evaluation Airway: WNL Heart: WNL Abdomen: WNL Chest/ Lungs: WNL ASA  Classification: 3 Mallampati/Airway Score: Two   Imaging: DG Chest Portable 1 View  Result Date: 11/14/2019 CLINICAL DATA:  Pain EXAM: PORTABLE CHEST 1 VIEW COMPARISON:  10/04/2016 FINDINGS: Rounded density projects over the right lower lung. Mild hyperinflation/COPD. Heart is borderline in size. Left basilar linear densities, likely scarring. No effusions. No acute bony abnormality. IMPRESSION: COPD, cardiomegaly. Rounded density projects over the right lower lung. This could be further evaluated with chest CT. Electronically Signed   By: Rolm Baptise M.D.   On: 11/14/2019 03:36   CT Angio Chest/Abd/Pel for Dissection W and/or Wo Contrast  Result Date: 11/14/2019 CLINICAL DATA:  Abdominal pain with aortic dissection suspected EXAM: CT ANGIOGRAPHY CHEST, ABDOMEN AND PELVIS TECHNIQUE: Non-contrast CT of the chest was initially obtained. Multidetector CT imaging through the chest, abdomen and pelvis was performed using the standard protocol during bolus administration of intravenous contrast. Multiplanar reconstructed images and MIPs were obtained and reviewed to evaluate the vascular anatomy. CONTRAST:  30mL OMNIPAQUE IOHEXOL 350 MG/ML SOLN COMPARISON:  Abdomen and  pelvis CT 01/26/2016 FINDINGS: CTA CHEST FINDINGS Cardiovascular: No intramural hematoma on the noncontrast phase. Cardiomegaly without pericardial effusion. Large appearance of the right heart and central pulmonary arteries, suggesting pulmonary hypertension. No aortic dissection or aneurysm. Aortic and coronary atherosclerosis. Mediastinum/Nodes: Right hilar/bronchial adenopathy with 15 mm diameter node. No enlarged mediastinal nodes are seen. Negative esophagus. Lungs/Pleura: Lobulated 3 cm mass in the right middle lobe. Emphysema and scratch the emphysema. There is no edema, consolidation, effusion, or pneumothorax. Musculoskeletal: No acute or aggressive finding. Degeneration of the thoracic spine. Review of the MIP images confirms the above findings. CTA ABDOMEN AND PELVIS FINDINGS VASCULAR Aorta: Diffuse atheromatous plaque.  No dissection or aneurysm. Celiac: Plaque at the ostium without flow limiting stenosis. No branch occlusion or beading. SMA: Atheromatous plaque at the ostium. No branch occlusion or beading. Renals: Atheromatous plaque at both proximal renal arteries with at least moderate narrowing on the right. Negative for aneurysm or dissection. IMA: Small, patent vessel. Inflow: Multifocal atheromatous plaque without flow limiting stenosis, aneurysm, or dissection Veins: Negative in the arterial phase Review of the MIP images confirms the above findings. NON-VASCULAR Hepatobiliary: Hypodense masses throughout the liver, measuring up to approximately 3 cm. Negative gallbladder and biliary tree. Pancreas: Small calcifications at the pancreatic head. Negative for pancreatitis or visible mass. Spleen: Negative Adrenals/Urinary Tract: Negative adrenals. Patchy renal cortical scarring greater on the right at the upper pole. Renal cystic densities measuring up to 4.9 cm on the left. Stomach/Bowel: Negative for obstruction or visible inflammation. Colonic fluid levels reach the distal colon. There are a  few areas of luminal narrowing without  discrete mass, likely from luminal collapse. Lymphatic: Negative for adenopathy. Reproductive: Hysterectomy and pelvic floor laxity Other: No ascites or pneumoperitoneum Musculoskeletal: Right hip arthroplasty. No acute or aggressive finding. Prominent lumbar spine degeneration with L5-S1 anterolisthesis and mild scoliosis. Review of the MIP images confirms the above findings. IMPRESSION: 1. 3 cm right middle lobe mass with right hilar adenopathy and extensive metastatic appearance in the liver. 2. No evidence of acute aortic syndrome. 3. Aortic Atherosclerosis (ICD10-I70.0) and Emphysema (ICD10-J43.9). Electronically Signed   By: Monte Fantasia M.D.   On: 11/14/2019 05:21    Labs:  CBC: Recent Labs    11/23/18 0000 11/23/18 0000 01/09/19 0000 06/14/19 0000 11/14/19 0338 11/14/19 0339  WBC 4.4  --  5.0 7.3 9.1  --   HGB 10.3*   < > 9.7* 9.9* 7.3* 7.5*  HCT 30*   < > 29* 29* 24.1* 22.0*  PLT 225  --  236 276 288  --    < > = values in this interval not displayed.    COAGS: No results for input(s): INR, APTT in the last 8760 hours.  BMP: Recent Labs    11/23/18 0000 11/23/18 0000 01/05/19 0000 01/05/19 0000 06/14/19 0000 07/02/19 0000 11/14/19 0338 11/14/19 0339  NA 137   < > 135*   < > 128* 135* 130* 131*  K 4.7   < > 4.6   < > 4.5 4.4 4.6 4.6  CL 99   < > 96*   < > 87* 96* 91* 91*  CO2 28*   < > 23*  --  28* 30* 27  --   GLUCOSE  --   --   --   --   --   --  102* 100*  BUN 14   < > 12   < > 17 10 22 23   CALCIUM 8.7   < > 9.2  --  9.4 9.3 9.1  --   CREATININE 1.0   < > 0.9   < > 1.0 0.9 1.26* 1.40*  GFRNONAA 55.58   < > 60.80  --  50.89 60.59 43*  --   GFRAA 64.42  --  70.46  --  58.98 70.22  --   --    < > = values in this interval not displayed.    LIVER FUNCTION TESTS: Recent Labs    11/14/19 0338  BILITOT 0.4  AST 30  ALT 20  ALKPHOS 122  PROT 6.8  ALBUMIN 3.6    TUMOR MARKERS: No results for input(s): AFPTM, CEA,  CA199, CHROMGRNA in the last 8760 hours.  Assessment and Plan:  79 y/o F who presented to the ED today with complaints of intractable back pain found to have a right lung mass and likely metastatic liver disease. IR has been asked to perform a liver lesion biopsy to further direct care.  Patient history and imaging reviewed by Dr. Annamaria Boots today who agrees to procedure - plan for US guided liver lesion biopsy tomorrow (11/5) with moderate sedation. Patient to be NPO at midnight, hold anticoagulation/antiplatetet medications until post procedure, AM labs ordered, IR will call for patient when ready.  Risks and benefits of liver lesion biopsy was discussed with the patient and/or patient's family including, but not limited to bleeding, infection, damage to adjacent structures or low yield requiring additional tests.  All of the questions were answered and there is agreement to proceed.  Consent signed and in IR control room.   Thank you for  this interesting consult.  I greatly enjoyed meeting ELEXA KIVI and look forward to participating in their care.  A copy of this report was sent to the requesting provider on this date.  Electronically Signed: Joaquim Nam, PA-C 11/14/2019, 2:48 PM   I spent a total of 20 Minutes  in face to face in clinical consultation, greater than 50% of which was counseling/coordinating care for liver lesion biopsy.

## 2019-11-14 NOTE — ED Notes (Addendum)
Pt resting in bed. NADN 

## 2019-11-14 NOTE — H&P (Signed)
History and Physical    Dawn [name] SJG:[id number] DOB: May 05, 1940 DOA: 11/14/2019  PCP: [name], MD   Patient coming from: Skilled nursing facility I have personally briefly reviewed patient's old medical records in [place]  Chief Complaint: Low back pain  HPI: Dawn [name] is a 79 y.o. female with medical history significant for COPD with chronic respiratory failure on 3 L of oxygen, chronic pain syndrome, stage III chronic kidney disease, hypertension, history of spinal stenosis, hypothyroidism who was brought into the emergency room by EMS for evaluation of severe pain in her lower thoracic spine/upper lumbar spine.  She rated the pain a 10 x 10 in intensity at its worst and has no relieving or aggravating factors.  She denies having any urinary or fecal incontinence.  She has no lower extremity swelling.  She has no pelvic pain or paresthesia. She denies having any trauma or any falls. She denies having any chest pain, no shortness of breath, no nausea, no dizziness, no lightheadedness, no abdominal pain, no urinary symptoms. Labs show sodium 131, potassium 4.6, chloride 91, glucose 100, BUN 23, creatinine 1.40, calcium 9.1, alkaline phosphatase 122, albumin 3.6, AST 30, ALT 20, total protein 6.8, white count 9.1, hemoglobin 7.5, hematocrit 22, MCV 92.7, RDW 14.5, platelet count 288, digoxin level 0.8, TSH 3.40 Chest x-ray reviewed by me shows COPD, cardiomegaly.Rounded density projects over the right lower lung. This could be further evaluated with chest CT. CT angiogram of chest/abdomen/pelvis showed 3 cm right middle lobe mass with right hilar adenopathy and extensive metastatic appearance in the liver. No evidence of acute aortic syndrome. Aortic Atherosclerosis Twelve-lead EKG reviewed by me shows sinus rhythm with PACs and a right bundle branch block    ED Course: Patient is an 79 year old Caucasian female who was sent from the skilled nursing  facility for evaluation of severe pain in the thoracic/lumbar area.  She rates her pain a 10 x 10 in intensity at its worst and denies having any trauma.  Chest x-ray showed rounded density projecting over the right lower lung.  CT angiogram of the chest/abdomen and pelvis shows a 3 cm right middle lobe mass with right hilar adenopathy and extensive metastatic appearance in the liver.  Patient also has worsening anemia when compared to her baseline 9.9 >> 7.3g/dl  Review of Systems: As per HPI otherwise 10 point review of systems negative.    Past Medical History:  Diagnosis Date  . Anemia   . Chronic diastolic heart failure ([place])   . Chronic kidney disease, stage 3 ([place])   . Chronic pain syndrome   . Constipation   . COPD (chronic obstructive pulmonary disease) ([place])   . Dysphagia   . Frequent falls   . Generalized muscle weakness   . GERD (gastroesophageal reflux disease)   . Headache   . Hip pain, right   . History of anxiety   . History of depression   . Hyperglycemia   . Hypothyroid   . Insomnia   . Metabolic encephalopathy   . Metastatic disease ([place]) 11/14/2019  . Palpitation   . Polyneuropathy   . Spinal stenosis   . Spinal stenosis, cervical region   . Supraventricular tachycardia ([place])   . Thyrotoxicosis, unspecified with thyrotoxic crisis or storm   . Tremor   . Unsteadiness on feet   . UTI (urinary tract infection)     Past Surgical History:  Procedure Laterality Date  . ANKLE FRACTURE SURGERY Left   .  CATARACT EXTRACTION, BILATERAL    . FLEXIBLE SIGMOIDOSCOPY Left 11/18/2015   Procedure: FLEXIBLE SIGMOIDOSCOPY;  Surgeon: [name], MD;  Location: [facility];  Service: Endoscopy;  Laterality: Left;  . FLEXIBLE SIGMOIDOSCOPY N/A 11/20/2015   Procedure: FLEXIBLE SIGMOIDOSCOPY;  Surgeon: [name], MD;  Location: [facility] ENDOSCOPY;  Service: Endoscopy;  Laterality: N/A;  . HIP ARTHROPLASTY Right 01/12/2016   Procedure: ARTHROPLASTY BIPOLAR HIP (HEMIARTHROPLASTY);   Surgeon: [name], MD;  Location: [facility];  Service: Orthopedics;  Laterality: Right;     reports that she quit smoking about 3 years ago. She has never used smokeless tobacco. She reports that she does not drink alcohol and does not use drugs.  Allergies  Allergen Reactions  . Biofreeze [Menthol (Topical Analgesic)] Rash    By history Aspercreme does not cause a rash    Family History  Problem Relation Age of Onset  . Heart attack Mother   . Diabetes Father   . Breast cancer Sister   . Thyroid disease Neg Hx      Prior to Admission medications   Medication Sig Start Date End Date Taking? Authorizing Provider  acetaminophen (TYLENOL) 325 MG tablet Take 650 mg by mouth every 6 (six) hours as needed.    Yes [provider]  albuterol (PROVENTIL) (2.5 MG/3ML) 0.083% nebulizer solution Take 2.5 mg by nebulization every 6 (six) hours as needed for wheezing or shortness of breath.   Yes [provider]  aspirin 81 MG chewable tablet Chew 81 mg by mouth daily.    Yes [provider]  calcium carbonate (TUMS - DOSED IN MG ELEMENTAL CALCIUM) 500 MG chewable tablet Chew 2 tablets by mouth every 8 (eight) hours as needed for indigestion or heartburn.   Yes [provider]  digoxin (LANOXIN) 0.125 MG tablet Take 0.125 mg by mouth daily. Hold if HR <60   Yes [provider]  DULoxetine (CYMBALTA) 60 MG capsule Take 120 mg by mouth daily. For Depression   Yes [provider]  Eyelid Cleansers (STERILID EX) Apply 1 each topically in the morning. Apply to closed eyelids and gently rub it in then wipe it.   Yes [provider]  fexofenadine (ALLEGRA) 180 MG tablet Take 180 mg by mouth daily. 06/15/19  Yes [provider]  flecainide (TAMBOCOR) 50 MG tablet Take 50 mg by mouth 2 (two) times daily.   Yes [provider]  fluticasone (FLONASE ALLERGY RELIEF) 50 MCG/ACT nasal spray Place 1 spray into both nostrils daily.     Yes [provider]  furosemide (LASIX) 20 MG tablet Take 20 mg by mouth every Monday, Wednesday, and Friday.   Yes [provider]  guaiFENesin (MUCINEX) 600 MG 12 hr tablet Take 600 mg by mouth 2 (two) times daily. COPD cough   Yes [provider]  HYDROcodone-acetaminophen (NORCO/VICODIN) 5-325 MG tablet Give **1/2 tablet by mouth q 6 hrs PRN Patient taking differently: Take 0.5 tablets by mouth every 6 (six) hours as needed for moderate pain. Give **1/2 tablet by mouth q 6 hrs PRN  11/13/19  Yes Medina-Vargas, Monina C, NP  levothyroxine (SYNTHROID) 25 MCG tablet Take 25 mcg by mouth daily before breakfast.   Yes [provider]  Multiple Vitamins-Minerals (MULTIVITAMIN WITH MINERALS) tablet Take 1 tablet by mouth daily.   Yes [provider]  ondansetron (ZOFRAN) 4 MG tablet Take 4 mg by mouth every 8 (eight) hours as needed for nausea.    Yes [provider]  OXYGEN Inhale 2 L into the lungs daily as needed (oxygen).    Yes [provider]  pantoprazole (PROTONIX) 40 MG tablet Take 40 mg by mouth daily.   Yes [provider]  polyethylene glycol (MIRALAX / GLYCOLAX) packet Take 17 g by mouth daily.   Yes [provider]  polyvinyl alcohol (ARTIFICIAL TEARS) 1.4 % ophthalmic solution Place 2 drops into both eyes in the morning, at noon, in the evening, and at bedtime. For dry eyes    Yes [provider]  predniSONE (DELTASONE) 5 MG tablet Take 5 mg by mouth daily with breakfast. TAKE 1 TABLET BY MOUTH DAILY (WITH BREAKFAST) JK:DTOI   Yes [provider]  primidone (MYSOLINE) 50 MG tablet Take 100 mg by mouth at bedtime.    Yes [provider]  propranolol ER (INDERAL LA) 60 MG 24 hr capsule Take 1 capsule (60 mg total) by mouth daily. 03/14/19  Yes Marcial Pacas, MD  sennosides-docusate sodium (SENOKOT-S) 8.6-50 MG tablet Take 1 tablet by mouth 2 (two) times daily.    Yes [provider]   traZODone (DESYREL) 50 MG tablet Take 1/2 tab =25 mg by mouth at bedtime Patient taking differently: Take 25 mg by mouth at bedtime. Take 1/2 tab =25 mg by mouth at bedtime 11/01/18  Yes Medina-Vargas, Monina C, NP  traZODone (DESYREL) 50 MG tablet Take 12.5 mg by mouth 2 (two) times daily. 1/4 tablet to = 12.5 mg 03/14/19  Yes [provider]  umeclidinium bromide (INCRUSE ELLIPTA) 62.5 MCG/INH AEPB Inhale 1 puff into the lungs daily. [facility])   Yes [provider]    Physical Exam: Vitals:   11/14/19 0815 11/14/19 0830 11/14/19 0845 11/14/19 0900  BP: (!) 129/94 133/65 124/78 134/65  Pulse: (!) 34 65 67 66  Resp:      Temp:      TempSrc:      SpO2: 95% 100% 100% 100%     Vitals:   11/14/19 0815 11/14/19 0830 11/14/19 0845 11/14/19 0900  BP: (!) 129/94 133/65 124/78 134/65  Pulse: (!) 34 65 67 66  Resp:      Temp:      TempSrc:      SpO2: 95% 100% 100% 100%    Constitutional: NAD, alert and oriented x 3.  Appears to be in painful distress Eyes: PERRL, lids and conjunctivae pallor ENMT: Mucous membranes are moist.  Neck: normal, supple, no masses, no thyromegaly Respiratory: clear to auscultation bilaterally, no wheezing, no crackles. Normal respiratory effort. No accessory muscle use.  Cardiovascular: Regular rate and rhythm, no murmurs / rubs / gallops. No extremity edema. 2+ pedal pulses. No carotid bruits.  Abdomen: no tenderness, no masses palpated. No hepatosplenomegaly. Bowel sounds positive.  Musculoskeletal: no clubbing / cyanosis. No joint deformity upper and lower extremities.  Skin: no rashes, lesions, ulcers.  Neurologic: No gross focal neurologic deficit. Able to move all extremities Psychiatric: Normal mood and affect.   Labs on Admission: I have personally reviewed following labs and imaging studies  CBC: Recent Labs  Lab 11/14/19 0338 11/14/19 0339  WBC 9.1  --   NEUTROABS 7.6  --   HGB 7.3* 7.5*  HCT 24.1* 22.0*  MCV 92.7  --    PLT 288  --    Basic Metabolic Panel: Recent Labs  Lab 11/14/19 0338 11/14/19 0339  NA 130* 131*  K 4.6 4.6  CL 91* 91*  CO2 27  --   GLUCOSE 102* 100*  BUN 22 23  CREATININE 1.26* 1.40*  CALCIUM 9.1  --    GFR: Estimated Creatinine Clearance: 30.5 mL/min (A) (by C-G formula based on SCr of 1.4 mg/dL (H)). Liver Function Tests: Recent Labs  Lab 11/14/19 0338  AST 30  ALT 20  ALKPHOS 122  BILITOT 0.4  PROT 6.8  ALBUMIN 3.6   No results for input(s): LIPASE, AMYLASE in the last 168 hours. No results for input(s): AMMONIA in the last 168 hours. Coagulation Profile: No results for input(s): INR, PROTIME in the last 168 hours. Cardiac Enzymes: No results for input(s): CKTOTAL, CKMB, CKMBINDEX, TROPONINI in the last 168 hours. BNP (last 3 results) No results for input(s): PROBNP in the last 8760 hours. HbA1C: No results for input(s): HGBA1C in the last 72 hours. CBG: No results for input(s): GLUCAP in the last 168 hours. Lipid Profile: No results for input(s): CHOL, HDL, LDLCALC, TRIG, CHOLHDL, LDLDIRECT in the last 72 hours. Thyroid Function Tests: No results for input(s): TSH, T4TOTAL, FREET4, T3FREE, THYROIDAB in the last 72 hours. Anemia Panel: No results for input(s): VITAMINB12, FOLATE, FERRITIN, TIBC, IRON, RETICCTPCT in the last 72 hours. Urine analysis:    Component Value Date/Time   COLORURINE AMBER (A) 11/14/2019 0338   APPEARANCEUR CLEAR 11/14/2019 0338   LABSPEC 1.023 11/14/2019 0338   PHURINE 5.0 11/14/2019 0338   GLUCOSEU NEGATIVE 11/14/2019 0338   HGBUR NEGATIVE 11/14/2019 0338   BILIRUBINUR SMALL (A) 11/14/2019 0338   KETONESUR 5 (A) 11/14/2019 0338   PROTEINUR NEGATIVE 11/14/2019 0338   UROBILINOGEN 0.2 04/29/2008 2235   NITRITE NEGATIVE 11/14/2019 0338   LEUKOCYTESUR NEGATIVE 11/14/2019 0338    Radiological Exams on Admission: DG Chest Portable 1 View  Result Date: 11/14/2019 CLINICAL DATA:  Pain EXAM: PORTABLE CHEST 1 VIEW  COMPARISON:  10/04/2016 FINDINGS: Rounded density projects over the right lower lung. Mild hyperinflation/COPD. Heart is borderline in size. Left basilar linear densities, likely scarring. No effusions. No acute bony abnormality. IMPRESSION: COPD, cardiomegaly. Rounded density projects over the right lower lung. This could be further evaluated with chest CT. Electronically Signed   By: Rolm Baptise M.D.   On: 11/14/2019 03:36   CT Angio Chest/Abd/Pel for Dissection W and/or Wo Contrast  Result Date: 11/14/2019 CLINICAL DATA:  Abdominal pain with aortic dissection suspected EXAM: CT ANGIOGRAPHY CHEST, ABDOMEN AND PELVIS TECHNIQUE: Non-contrast CT of the chest was initially obtained. Multidetector CT imaging through the chest, abdomen and pelvis was performed using the standard protocol during bolus administration of intravenous contrast. Multiplanar reconstructed images and MIPs were obtained and reviewed to evaluate the vascular anatomy. CONTRAST:  81mL OMNIPAQUE IOHEXOL 350 MG/ML SOLN COMPARISON:  Abdomen and pelvis CT 01/26/2016 FINDINGS: CTA CHEST FINDINGS Cardiovascular: No intramural hematoma on the noncontrast phase. Cardiomegaly without pericardial effusion. Large appearance of the right heart and central pulmonary arteries, suggesting pulmonary hypertension. No aortic dissection or aneurysm. Aortic and coronary atherosclerosis. Mediastinum/Nodes: Right hilar/bronchial adenopathy with 15 mm diameter node. No enlarged mediastinal nodes are seen. Negative esophagus. Lungs/Pleura: Lobulated 3 cm mass in the right middle lobe. Emphysema and scratch the emphysema. There is no edema, consolidation, effusion, or pneumothorax. Musculoskeletal: No acute or aggressive finding. Degeneration of the thoracic spine. Review of the MIP images confirms the above findings. CTA ABDOMEN AND PELVIS FINDINGS VASCULAR Aorta: Diffuse atheromatous plaque.  No dissection or aneurysm. Celiac: Plaque at the ostium without flow  limiting stenosis. No branch occlusion or beading. SMA: Atheromatous plaque at the ostium. No branch occlusion or beading. Renals:  Atheromatous plaque at both proximal renal arteries with at least moderate narrowing on the right. Negative for aneurysm or dissection. IMA: Small, patent vessel. Inflow: Multifocal atheromatous plaque without flow limiting stenosis, aneurysm, or dissection Veins: Negative in the arterial phase Review of the MIP images confirms the above findings. NON-VASCULAR Hepatobiliary: Hypodense masses throughout the liver, measuring up to approximately 3 cm. Negative gallbladder and biliary tree. Pancreas: Small calcifications at the pancreatic head. Negative for pancreatitis or visible mass. Spleen: Negative Adrenals/Urinary Tract: Negative adrenals. Patchy renal cortical scarring greater on the right at the upper pole. Renal cystic densities measuring up to 4.9 cm on the left. Stomach/Bowel: Negative for obstruction or visible inflammation. Colonic fluid levels reach the distal colon. There are a few areas of luminal narrowing without discrete mass, likely from luminal collapse. Lymphatic: Negative for adenopathy. Reproductive: Hysterectomy and pelvic floor laxity Other: No ascites or pneumoperitoneum Musculoskeletal: Right hip arthroplasty. No acute or aggressive finding. Prominent lumbar spine degeneration with L5-S1 anterolisthesis and mild scoliosis. Review of the MIP images confirms the above findings. IMPRESSION: 1. 3 cm right middle lobe mass with right hilar adenopathy and extensive metastatic appearance in the liver. 2. No evidence of acute aortic syndrome. 3. Aortic Atherosclerosis (ICD10-I70.0) and Emphysema (ICD10-J43.9). Electronically Signed   By: Monte Fantasia M.D.   On: 11/14/2019 05:21    EKG: Independently reviewed.  Sinus rhythm with PACs Right bundle branch block  Assessment/Plan Principal Problem:   Mass of middle lobe of right lung Active Problems:   COPD  (chronic obstructive pulmonary disease) (HCC)   CKD (chronic kidney disease) stage 3, GFR 30-59 ml/min (HCC)   Narrow complex tachycardia (HCC)   HTN (hypertension)   SVT (supraventricular tachycardia) (HCC)   Chronic diastolic CHF (congestive heart failure) ([place])   Metastatic disease (HCC)   Chronic respiratory failure with hypoxia (HCC)   Anemia in other chronic diseases classified elsewhere     Mass of middle lobe of right lung ??  Metastatic disease to the liver Patient does not have a known history of any malignancy. We will request oncology consult for further evaluation and work-up   Acute on chronic anemia of chronic disease At baseline patient has a hemoglobin of 9.9g/dl Today on admission hemoglobin is 7.5g/dl Obtain Hemoccult and serum iron studies Will transfuse as needed    Back pain Involving the thoracic and upper lumbar spine Unclear etiology ??  Metastatic disease to the spine Pain control   History of SVT Continue digoxin and flecainide   Hypothyroidism Continue Synthroid   Chronic diastolic dysfunction CHF Continue propranolol and furosemide   Depression Continue Cymbalta   COPD with chronic respiratory failure Continue oxygen supplementation at 2-3L to maintain pulse oximetry greater than 92% Continue as needed bronchodilator therapy as well as inhaled steroids     DVT prophylaxis: SCD Code Status: Full code Family Communication: Greater than 50% of time was spent discussing plan of care with patient and her daughter at the bedside.  All questions and concerns have been addressed.  They verbalized understanding and agree with the plan.  CODE STATUS was discussed and patient is a full code Disposition Plan: Back to previous home environment Consults called: Oncology    Collier Bullock MD [facility]     11/14/2019, 9:58 AM

## 2019-11-14 NOTE — ED Notes (Signed)
Pt resting in bed. NADN 

## 2019-11-14 NOTE — ED Notes (Signed)
Change of shift. First contact. Pt resting in bed. NADN

## 2019-11-14 NOTE — ED Notes (Signed)
Pt placed on bedpan

## 2019-11-14 NOTE — ED Triage Notes (Signed)
Per [facility], pt coming from [facility]. C/o severe mid thoracic back pain, sharp stabbing pain. No prior hx. Pt states not having bowel movement in a day, pt given a laxative at facility about 78min ago. Pt 120/57, pulse 67, spo2 95%, hx copd.

## 2019-11-14 NOTE — ED Notes (Signed)
Lunch Tray Ordered @ 1016. 

## 2019-11-14 NOTE — Consult Note (Addendum)
[place]  Telephone:([phone]) [phone] [phone]   MEDICAL ONCOLOGY - INITIAL CONSULTATION  Referral MD: Dr. Gean Birchwood  Reason for Referral: Right middle lobe of the lung mass with right hilar adenopathy and extensive metastatic appearance of the liver  HPI: 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  She resides at a skilled nursing facility.  She was brought to the emergency room for severe lower thoracic spine/upper lumbar spine pain which she rated as 10/10.  Labs on admission showed a hemoglobin of 7.3 with a normal MCV of 92.7, sodium 130, creatinine 1.26.  Stool for occult blood has been ordered and is pending.  Chest x-ray showed COPD and cardiomegaly as well as a rounded density over the right lower lung.  A CTA chest/abdomen/pelvis demonstrated a 3 cm right middle lobe mass with right hilar adenopathy and extensive metastatic appearance of the liver.  There were no acute or aggressive findings on CT in the bones but there was prominent lumbar spine degeneration with L5-S1 anterolisthesis and mild scoliosis.  The patient was seen in the emergency room.  Her daughter was at the bedside.  The patient reports that she has had a poor appetite and has lost weight recently.  She is uncertain how much weight she has lost but states that she is weighed regularly at the long-term care facility and staff have told her that she has lost weight.  She reports a mild headache but this has been present for many months and she even had an MRI of the brain performed on 07/24/2019 which was negative for malignancy.  She denies chest pain but reports mild shortness of breath.  She states that she wears O2 at 3 L routinely.  Denies abdominal pain, nausea, vomiting, constipation, diarrhea.  She  has not noticed any bleeding such as epistaxis, hemoptysis, hematemesis, hematuria, melena, hematochezia.  She reports right hip pain and low back pain.  Pain currently controlled with IV pain medication.  The patient resides in a long-term care facility and has lived there for 3 to 4 years.  The patient reports that she stays in bed most of the day.  On occasion, she will get up to a wheelchair and move about the facility.  Depends on facility staff for most of her ADLs. She has 1 daughter.  Denies alcohol use.  She smoked 1/2 to 1 pack of cigarettes per day for about 50 years.  She quit smoking only a few years ago when she moved to the long-term care facility.  Reports that she had a sister who died from metastatic breast cancer.  Medical oncology was asked to see the patient to make recommendations regarding her abnormal CT scan findings.   Past Medical History:  Diagnosis Date   Anemia    Chronic diastolic heart failure (HCC)    Chronic kidney disease, stage 3 (HCC)    Chronic pain syndrome    Constipation    COPD (chronic obstructive pulmonary disease) (HCC)    Dysphagia    Frequent falls    Generalized muscle weakness    GERD (gastroesophageal reflux disease)    Headache    Hip pain, right    History of anxiety    History of depression    Hyperglycemia    Hypothyroid    Insomnia  Metabolic encephalopathy    Metastatic disease ([place]) 11/14/2019   Palpitation    Polyneuropathy    Spinal stenosis    Spinal stenosis, cervical region    Supraventricular tachycardia ([place])    Thyrotoxicosis, unspecified with thyrotoxic crisis or storm    Tremor    Unsteadiness on feet    UTI (urinary tract infection)   :  Past Surgical History:  Procedure Laterality Date   ANKLE FRACTURE SURGERY Left    CATARACT EXTRACTION, BILATERAL     FLEXIBLE SIGMOIDOSCOPY Left 11/18/2015   Procedure: FLEXIBLE SIGMOIDOSCOPY;  Surgeon: [name], MD;  Location: [facility];   Service: Endoscopy;  Laterality: Left;   FLEXIBLE SIGMOIDOSCOPY N/A 11/20/2015   Procedure: FLEXIBLE SIGMOIDOSCOPY;  Surgeon: [name], MD;  Location: [facility] ENDOSCOPY;  Service: Endoscopy;  Laterality: N/A;   HIP ARTHROPLASTY Right 01/12/2016   Procedure: ARTHROPLASTY BIPOLAR HIP (HEMIARTHROPLASTY);  Surgeon: [name], MD;  Location: [facility];  Service: Orthopedics;  Laterality: Right;  :  Current Facility-Administered Medications  Medication Dose Route Frequency Provider Last Rate Last Admin   0.9 %  sodium chloride infusion  250 mL Intravenous PRN Agbata, Tochukwu, MD       acetaminophen (TYLENOL) tablet 650 mg  650 mg Oral Q6H PRN Agbata, Tochukwu, MD       albuterol (PROVENTIL) (2.5 MG/3ML) 0.083% nebulizer solution 2.5 mg  2.5 mg Nebulization Q6H PRN Agbata, Tochukwu, MD       aspirin chewable tablet 81 mg  81 mg Oral Daily Agbata, Tochukwu, MD   81 mg at 11/14/19 1032   calcium carbonate (TUMS - dosed in mg elemental calcium) chewable tablet 400 mg of elemental calcium  2 tablet Oral Q8H PRN Agbata, Tochukwu, MD       digoxin (LANOXIN) tablet 0.125 mg  0.125 mg Oral Daily Agbata, Tochukwu, MD   0.125 mg at 11/14/19 1035   DULoxetine (CYMBALTA) DR capsule 120 mg  120 mg Oral Daily Agbata, Tochukwu, MD       flecainide (TAMBOCOR) tablet 50 mg  50 mg Oral BID Agbata, Tochukwu, MD       fluticasone (FLONASE) 50 MCG/ACT nasal spray 1 spray  1 spray Each Nare Daily Agbata, Tochukwu, MD       [START ON 11/15/2019] furosemide (LASIX) tablet 20 mg  20 mg Oral Q M,W,F Agbata, Tochukwu, MD       guaiFENesin (MUCINEX) 12 hr tablet 600 mg  600 mg Oral BID Agbata, Tochukwu, MD       HYDROmorphone (DILAUDID) injection 1 mg  1 mg Intravenous Q4H PRN Agbata, Tochukwu, MD       [START ON 11/15/2019] levothyroxine (SYNTHROID) tablet 25 mcg  25 mcg Oral QAC breakfast Agbata, Tochukwu, MD       loratadine (CLARITIN) tablet 10 mg  10 mg Oral Daily Agbata, Tochukwu, MD   10 mg at 11/14/19 1033    multivitamin with minerals tablet 1 tablet  1 tablet Oral Daily [name], MD   1 tablet at 11/14/19 1033   ondansetron (ZOFRAN) tablet 4 mg  4 mg Oral Q6H PRN Agbata, Tochukwu, MD       Or   ondansetron (ZOFRAN) injection 4 mg  4 mg Intravenous Q6H PRN Agbata, Tochukwu, MD       pantoprazole (PROTONIX) EC tablet 40 mg  40 mg Oral Daily Agbata, Tochukwu, MD   40 mg at 11/14/19 1032   polyethylene glycol (MIRALAX / GLYCOLAX) packet 17 g  17 g Oral Daily Agbata,  Tochukwu, MD       polyvinyl alcohol (LIQUIFILM TEARS) 1.4 % ophthalmic solution 2 drop  2 drop Both Eyes TID Agbata, Tochukwu, MD       [START ON 11/15/2019] predniSONE (DELTASONE) tablet 5 mg  5 mg Oral Q breakfast Agbata, Tochukwu, MD       primidone (MYSOLINE) tablet 100 mg  100 mg Oral QHS Agbata, Tochukwu, MD       propranolol ER (INDERAL LA) 24 hr capsule 60 mg  60 mg Oral Daily Agbata, Tochukwu, MD       senna-docusate (Senokot-S) tablet 1 tablet  1 tablet Oral BID [name], MD       sodium chloride flush (NS) 0.9 % injection 3 mL  3 mL Intravenous Q12H Agbata, Tochukwu, MD   3 mL at 11/14/19 1038   sodium chloride flush (NS) 0.9 % injection 3 mL  3 mL Intravenous PRN Agbata, Tochukwu, MD       traZODone (DESYREL) tablet 25 mg  25 mg Oral QHS Agbata, Tochukwu, MD       umeclidinium bromide (INCRUSE ELLIPTA) 62.5 MCG/INH 1 puff  1 puff Inhalation Daily Agbata, Tochukwu, MD       Current Outpatient Medications  Medication Sig Dispense Refill   acetaminophen (TYLENOL) 325 MG tablet Take 650 mg by mouth every 6 (six) hours as needed.      albuterol (PROVENTIL) (2.5 MG/3ML) 0.083% nebulizer solution Take 2.5 mg by nebulization every 6 (six) hours as needed for wheezing or shortness of breath.     aspirin 81 MG chewable tablet Chew 81 mg by mouth daily.      calcium carbonate (TUMS - DOSED IN MG ELEMENTAL CALCIUM) 500 MG chewable tablet Chew 2 tablets by mouth every 8 (eight) hours as needed for  indigestion or heartburn.     digoxin (LANOXIN) 0.125 MG tablet Take 0.125 mg by mouth daily. Hold if HR <60     DULoxetine (CYMBALTA) 60 MG capsule Take 120 mg by mouth daily. For Depression     Eyelid Cleansers (STERILID EX) Apply 1 each topically in the morning. Apply to closed eyelids and gently rub it in then wipe it.     fexofenadine (ALLEGRA) 180 MG tablet Take 180 mg by mouth daily.     flecainide (TAMBOCOR) 50 MG tablet Take 50 mg by mouth 2 (two) times daily.     fluticasone (FLONASE ALLERGY RELIEF) 50 MCG/ACT nasal spray Place 1 spray into both nostrils daily.      furosemide (LASIX) 20 MG tablet Take 20 mg by mouth every Monday, Wednesday, and Friday.     guaiFENesin (MUCINEX) 600 MG 12 hr tablet Take 600 mg by mouth 2 (two) times daily. COPD cough     HYDROcodone-acetaminophen (NORCO/VICODIN) 5-325 MG tablet Give **1/2 tablet by mouth q 6 hrs PRN (Patient taking differently: Take 0.5 tablets by mouth every 6 (six) hours as needed for moderate pain. Give **1/2 tablet by mouth q 6 hrs PRN ) 60 tablet 0   levothyroxine (SYNTHROID) 25 MCG tablet Take 25 mcg by mouth daily before breakfast.     Multiple Vitamins-Minerals (MULTIVITAMIN WITH MINERALS) tablet Take 1 tablet by mouth daily.     ondansetron (ZOFRAN) 4 MG tablet Take 4 mg by mouth every 8 (eight) hours as needed for nausea.      OXYGEN Inhale 2 L into the lungs daily as needed (oxygen).      pantoprazole (PROTONIX) 40 MG tablet Take 40 mg by  mouth daily.     polyethylene glycol (MIRALAX / GLYCOLAX) packet Take 17 g by mouth daily.     polyvinyl alcohol (ARTIFICIAL TEARS) 1.4 % ophthalmic solution Place 2 drops into both eyes in the morning, at noon, in the evening, and at bedtime. For dry eyes      predniSONE (DELTASONE) 5 MG tablet Take 5 mg by mouth daily with breakfast. TAKE 1 TABLET BY MOUTH DAILY (WITH BREAKFAST) WJ:[id number]     primidone (MYSOLINE) 50 MG tablet Take 100 mg by mouth at bedtime.       propranolol ER (INDERAL LA) 60 MG 24 hr capsule Take 1 capsule (60 mg total) by mouth daily. 30 capsule 11   sennosides-docusate sodium (SENOKOT-S) 8.6-50 MG tablet Take 1 tablet by mouth 2 (two) times daily.      traZODone (DESYREL) 50 MG tablet Take 1/2 tab =25 mg by mouth at bedtime (Patient taking differently: Take 25 mg by mouth at bedtime. Take 1/2 tab =25 mg by mouth at bedtime) 30 tablet 3   traZODone (DESYREL) 50 MG tablet Take 12.5 mg by mouth 2 (two) times daily. 1/4 tablet to = 12.5 mg     umeclidinium bromide (INCRUSE ELLIPTA) 62.5 MCG/INH AEPB Inhale 1 puff into the lungs daily. 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   Right handed.   Occasional caffeine.         Social Determinants of Health   Financial Resource Strain:    Difficulty of Paying Living Expenses: Not on file  Food Insecurity:     Worried About Charity fundraiser in the Last Year: Not on file   [employer] of Food in the Last Year: Not on file  Transportation Needs:    Lack of Transportation (Medical): Not on file   Lack of Transportation (Non-Medical): Not on file  Physical Activity:    Days of Exercise per Week: Not on file   Minutes of Exercise per Session: Not on file  Stress:    Feeling of Stress : Not on file  Social Connections:    Frequency of Communication with Friends and Family: Not on file   Frequency  of Social Gatherings with Friends and Family: Not on file   Attends Religious Services: Not on file   Active Member of Clubs or Organizations: Not on file   Attends Archivist Meetings: Not on file   Marital Status: Not on file  Intimate Partner Violence:    Fear of Current or Ex-Partner: Not on file   Emotionally Abused: Not on file   Physically Abused: Not on file   Sexually Abused: Not on file  :  Review of Systems: A comprehensive 14 point review of systems was negative except as noted in the HPI.  Exam: Patient Vitals for the past 24 hrs:  BP Temp Temp src Pulse Resp SpO2  11/14/19 1035 -- -- -- 68 -- --  11/14/19 1000 139/70 -- -- 62 -- 98 %  11/14/19 0945 134/70 -- -- 62 -- 99 %  11/14/19 0930 128/72 -- -- -- -- --  11/14/19 0915 131/69 -- -- -- -- --  11/14/19 0900 134/65 -- -- 66 -- 100 %  11/14/19 0845 124/78 -- -- 67 -- 100 %  11/14/19 0830 133/65 -- -- 65 -- 100 %  11/14/19 0815 (!) 129/94 -- -- (!) 34 -- 95 %  11/14/19 0800 (!) 126/98 -- -- (!) 57 20 100 %  11/14/19 0745 134/69 -- -- 65 16 100 %  11/14/19 0730 134/74 -- -- 68 17 100 %  11/14/19 0715 (!) 122/54 98.2 F (36.8 C) -- (!) 58 20 100 %  11/14/19 0700 135/68 -- -- (!) 55 19 100 %  11/14/19 0509 -- -- -- -- -- 99 %  11/14/19 0459 133/64 -- -- 73 18 100 %  11/14/19 0254 (!) 110/59 98.4 F (36.9 C) Oral 71 19 91 %    General: Chronically ill-appearing female, no distress. Eyes:  no  scleral icterus.   ENT:  There were no oropharyngeal lesions.   Lymphatics:  Negative cervical, supraclavicular or axillary adenopathy.   Respiratory: lungs were clear bilaterally without wheezing or crackles.   Cardiovascular:  Regular rate and rhythm, S1/S2, without murmur, rub or gallop.  There was no pedal edema.    GI:  abdomen was soft, flat, nontender, nondistended, without organomegaly.   Musculoskeletal: Pain with palpation to the right hip and lumbar spine. Skin exam was without echymosis, petichae.   Neuro exam was nonfocal. Patient was alert and oriented.  Attention was good.   Language was appropriate.  Mood was normal without depression.  Speech was not pressured.  Thought content was not tangential.     Lab Results  Component Value Date   WBC 9.1 11/14/2019   HGB 7.5 (L) 11/14/2019   HCT 22.0 (L) 11/14/2019   PLT 288 11/14/2019   GLUCOSE 100 (H) 11/14/2019   CHOL 107 12/01/2015   TRIG 107 12/01/2015   HDL 27 (L) 12/01/2015   LDLCALC 59 12/01/2015   ALT 20 11/14/2019   AST 30 11/14/2019   NA 131 (L) 11/14/2019   K 4.6 11/14/2019   CL 91 (L) 11/14/2019   CREATININE 1.40 (H) 11/14/2019   BUN 23 11/14/2019   CO2 27 11/14/2019    DG Chest Portable 1 View  Result Date: 11/14/2019 CLINICAL DATA:  Pain EXAM: PORTABLE CHEST 1 VIEW COMPARISON:  10/04/2016 FINDINGS: Rounded density projects over the right lower lung. Mild hyperinflation/COPD. Heart is borderline in size. Left basilar linear densities, likely scarring. No effusions. No acute bony abnormality. IMPRESSION: COPD, cardiomegaly. Rounded density projects over the right lower lung.  This could be further evaluated with chest CT. Electronically Signed   By: Rolm Baptise M.D.   On: 11/14/2019 03:36   CT Angio Chest/Abd/Pel for Dissection W and/or Wo Contrast  Result Date: 11/14/2019 CLINICAL DATA:  Abdominal pain with aortic dissection suspected EXAM: CT ANGIOGRAPHY CHEST, ABDOMEN AND PELVIS TECHNIQUE: Non-contrast CT  of the chest was initially obtained. Multidetector CT imaging through the chest, abdomen and pelvis was performed using the standard protocol during bolus administration of intravenous contrast. Multiplanar reconstructed images and MIPs were obtained and reviewed to evaluate the vascular anatomy. CONTRAST:  34mL OMNIPAQUE IOHEXOL 350 MG/ML SOLN COMPARISON:  Abdomen and pelvis CT 01/26/2016 FINDINGS: CTA CHEST FINDINGS Cardiovascular: No intramural hematoma on the noncontrast phase. Cardiomegaly without pericardial effusion. Large appearance of the right heart and central pulmonary arteries, suggesting pulmonary hypertension. No aortic dissection or aneurysm. Aortic and coronary atherosclerosis. Mediastinum/Nodes: Right hilar/bronchial adenopathy with 15 mm diameter node. No enlarged mediastinal nodes are seen. Negative esophagus. Lungs/Pleura: Lobulated 3 cm mass in the right middle lobe. Emphysema and scratch the emphysema. There is no edema, consolidation, effusion, or pneumothorax. Musculoskeletal: No acute or aggressive finding. Degeneration of the thoracic spine. Review of the MIP images confirms the above findings. CTA ABDOMEN AND PELVIS FINDINGS VASCULAR Aorta: Diffuse atheromatous plaque.  No dissection or aneurysm. Celiac: Plaque at the ostium without flow limiting stenosis. No branch occlusion or beading. SMA: Atheromatous plaque at the ostium. No branch occlusion or beading. Renals: Atheromatous plaque at both proximal renal arteries with at least moderate narrowing on the right. Negative for aneurysm or dissection. IMA: Small, patent vessel. Inflow: Multifocal atheromatous plaque without flow limiting stenosis, aneurysm, or dissection Veins: Negative in the arterial phase Review of the MIP images confirms the above findings. NON-VASCULAR Hepatobiliary: Hypodense masses throughout the liver, measuring up to approximately 3 cm. Negative gallbladder and biliary tree. Pancreas: Small calcifications at the  pancreatic head. Negative for pancreatitis or visible mass. Spleen: Negative Adrenals/Urinary Tract: Negative adrenals. Patchy renal cortical scarring greater on the right at the upper pole. Renal cystic densities measuring up to 4.9 cm on the left. Stomach/Bowel: Negative for obstruction or visible inflammation. Colonic fluid levels reach the distal colon. There are a few areas of luminal narrowing without discrete mass, likely from luminal collapse. Lymphatic: Negative for adenopathy. Reproductive: Hysterectomy and pelvic floor laxity Other: No ascites or pneumoperitoneum Musculoskeletal: Right hip arthroplasty. No acute or aggressive finding. Prominent lumbar spine degeneration with L5-S1 anterolisthesis and mild scoliosis. Review of the MIP images confirms the above findings. IMPRESSION: 1. 3 cm right middle lobe mass with right hilar adenopathy and extensive metastatic appearance in the liver. 2. No evidence of acute aortic syndrome. 3. Aortic Atherosclerosis (ICD10-I70.0) and Emphysema (ICD10-J43.9). Electronically Signed   By: Monte Fantasia M.D.   On: 11/14/2019 05:21     DG Chest Portable 1 View  Result Date: 11/14/2019 CLINICAL DATA:  Pain EXAM: PORTABLE CHEST 1 VIEW COMPARISON:  10/04/2016 FINDINGS: Rounded density projects over the right lower lung. Mild hyperinflation/COPD. Heart is borderline in size. Left basilar linear densities, likely scarring. No effusions. No acute bony abnormality. IMPRESSION: COPD, cardiomegaly. Rounded density projects over the right lower lung. This could be further evaluated with chest CT. Electronically Signed   By: Rolm Baptise M.D.   On: 11/14/2019 03:36   CT Angio Chest/Abd/Pel for Dissection W and/or Wo Contrast  Result Date: 11/14/2019 CLINICAL DATA:  Abdominal pain with aortic dissection suspected EXAM: CT ANGIOGRAPHY CHEST, ABDOMEN AND PELVIS TECHNIQUE:  Non-contrast CT of the chest was initially obtained. Multidetector CT imaging through the chest, abdomen  and pelvis was performed using the standard protocol during bolus administration of intravenous contrast. Multiplanar reconstructed images and MIPs were obtained and reviewed to evaluate the vascular anatomy. CONTRAST:  79mL OMNIPAQUE IOHEXOL 350 MG/ML SOLN COMPARISON:  Abdomen and pelvis CT 01/26/2016 FINDINGS: CTA CHEST FINDINGS Cardiovascular: No intramural hematoma on the noncontrast phase. Cardiomegaly without pericardial effusion. Large appearance of the right heart and central pulmonary arteries, suggesting pulmonary hypertension. No aortic dissection or aneurysm. Aortic and coronary atherosclerosis. Mediastinum/Nodes: Right hilar/bronchial adenopathy with 15 mm diameter node. No enlarged mediastinal nodes are seen. Negative esophagus. Lungs/Pleura: Lobulated 3 cm mass in the right middle lobe. Emphysema and scratch the emphysema. There is no edema, consolidation, effusion, or pneumothorax. Musculoskeletal: No acute or aggressive finding. Degeneration of the thoracic spine. Review of the MIP images confirms the above findings. CTA ABDOMEN AND PELVIS FINDINGS VASCULAR Aorta: Diffuse atheromatous plaque.  No dissection or aneurysm. Celiac: Plaque at the ostium without flow limiting stenosis. No branch occlusion or beading. SMA: Atheromatous plaque at the ostium. No branch occlusion or beading. Renals: Atheromatous plaque at both proximal renal arteries with at least moderate narrowing on the right. Negative for aneurysm or dissection. IMA: Small, patent vessel. Inflow: Multifocal atheromatous plaque without flow limiting stenosis, aneurysm, or dissection Veins: Negative in the arterial phase Review of the MIP images confirms the above findings. NON-VASCULAR Hepatobiliary: Hypodense masses throughout the liver, measuring up to approximately 3 cm. Negative gallbladder and biliary tree. Pancreas: Small calcifications at the pancreatic head. Negative for pancreatitis or visible mass. Spleen: Negative  Adrenals/Urinary Tract: Negative adrenals. Patchy renal cortical scarring greater on the right at the upper pole. Renal cystic densities measuring up to 4.9 cm on the left. Stomach/Bowel: Negative for obstruction or visible inflammation. Colonic fluid levels reach the distal colon. There are a few areas of luminal narrowing without discrete mass, likely from luminal collapse. Lymphatic: Negative for adenopathy. Reproductive: Hysterectomy and pelvic floor laxity Other: No ascites or pneumoperitoneum Musculoskeletal: Right hip arthroplasty. No acute or aggressive finding. Prominent lumbar spine degeneration with L5-S1 anterolisthesis and mild scoliosis. Review of the MIP images confirms the above findings. IMPRESSION: 1. 3 cm right middle lobe mass with right hilar adenopathy and extensive metastatic appearance in the liver. 2. No evidence of acute aortic syndrome. 3. Aortic Atherosclerosis (ICD10-I70.0) and Emphysema (ICD10-J43.9). Electronically Signed   By: Monte Fantasia M.D.   On: 11/14/2019 05:21   Assessment and Plan:  This is an 79 year old female with  1.  Abnormal CTA of the chest/abdomen/pelvis with a right middle lobe lung mass, right hilar adenopathy, and findings concerning for metastatic disease in the liver 2.  Severe low back pain 3.  Anemia 4.  History of SVT 5.  Hypothyroidism 6.  COPD with chronic respiratory failure 7.  Depression 8.  Chronic diastolic CHF  -Discussed CT scan findings with the patient and her daughter.  We discussed that findings are concerning for metastatic lung cancer.  We discussed additional work-up including obtaining a liver biopsy to confirm the diagnosis.  We discussed that given her multiple medical issues and poor performance status (ECOG performance status of 3), she may not be a candidate for systemic treatment.  We also discussed not pursuing work-up and focusing on comfort and considering hospice referral.  The patient and her daughter would like to  pursue a biopsy to confirm the diagnosis and then discuss potential next steps.  They understand that she may not be a treatment candidate.  The patient's daughter did ask if there was a role for surgery, and I advised her that surgery would not be indicated.  I have placed an order for an ultrasound-guided liver biopsy by IR. -Recommend further imaging of her lower back with an MRI of the lumbar spine.  There were no bone lesions noted on the CT scan.  If additional imaging shows findings concerning for bony metastatic disease, we can consider a course of palliative radiation. -Additional work-up pending for anemia.  Recommend checking stool for occult blood, ferritin, iron studies, vitamin B12 level, and folate.  Transfuse for hemoglobin less than 7.  Thank you for this referral.   Mikey Bussing, DNP, AGPCNP-BC, AOCNP  Attending Note  I personally saw the patient, reviewed the chart and examined the patient. The plan of care was discussed with the patient and the admitting team. I agree with the assessment and plan as documented above. Thank you very much for the consultation.  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 She is fairly elderly with poor performance status and is not a candidate for any systemic therapies.  However the family is requesting that we obtain a biopsy to confirm the cancer. For this reason she will be set up as an outpatient to undergo ultrasound-guided liver biopsy. I will follow the patient in my clinic to discuss the pathology report. Unless it is something like an estrogen receptor positive breast cancer which can be treated with palliative intent antiestrogen therapies systemic chemotherapy is not an option.  Multiple comorbidities including COPD, chronic respiratory failure on oxygen, stage III CKD,  SVT, hypertension, spinal stenosis, hypothyroidism and resides at a skilled nursing facility.  She has chronic pain 10 out of 10 in the lumbar spine.  We are available further questions or concerns.

## 2019-11-14 NOTE — ED Notes (Signed)
Report to Mordecai Rasmussen, [place]

## 2019-11-14 NOTE — ED Provider Notes (Signed)
[facility] EMERGENCY DEPARTMENT Provider Note   CSN: [id number] Arrival date & time: 11/14/19  0254     History Chief Complaint  Patient presents with  . Back Pain    Dawn [name] is a 79 y.o. female.  The history is provided by the patient.  Back Pain Location:  Thoracic spine Quality:  Stabbing Radiates to:  Does not radiate Pain severity:  Severe Pain is:  Same all the time Onset quality:  Sudden Duration:  1 day Timing:  Constant Progression:  Unchanged Chronicity:  New Relieved by:  Nothing Worsened by:  Nothing Ineffective treatments:  None tried Associated symptoms: no abdominal pain, no abdominal swelling, no bladder incontinence, no bowel incontinence, no chest pain, no dysuria, no fever, no headaches, no leg pain, no numbness, no paresthesias, no pelvic pain, no perianal numbness, no tingling, no weakness and no weight loss   Risk factors: hx of osteoporosis and lack of exercise        Past Medical History:  Diagnosis Date  . Anemia   . Chronic diastolic heart failure ([place])   . Chronic kidney disease, stage 3 ([place])   . Chronic pain syndrome   . Constipation   . COPD (chronic obstructive pulmonary disease) ([place])   . Dysphagia   . Frequent falls   . Generalized muscle weakness   . GERD (gastroesophageal reflux disease)   . Headache   . Hip pain, right   . History of anxiety   . History of depression   . Hyperglycemia   . Hypothyroid   . Insomnia   . Metabolic encephalopathy   . Palpitation   . Polyneuropathy   . Spinal stenosis   . Spinal stenosis, cervical region   . Supraventricular tachycardia ([place])   . Thyrotoxicosis, unspecified with thyrotoxic crisis or storm   . Tremor   . Unsteadiness on feet   . UTI (urinary tract infection)     Patient Active Problem List   Diagnosis Date Noted  . Chronic intractable headache 03/14/2019  . Essential tremor 03/14/2019  . Osteoarthritis 11/01/2018  . Somnolence, daytime  11/01/2018  . Constipation 10/05/2018  . Poor appetite 10/05/2018  . Palliative care patient 08/09/2018  . Hypothyroidism 07/24/2018  . Seasonal allergies 06/30/2018  . Chronic radicular cervical pain 04/26/2018  . Chronic right-sided low back pain with right-sided sciatica 03/21/2018  . Spinal stenosis 11/23/2017  . Headache 08/31/2017  . Elevated TSH 08/31/2017  . Pain in right hip 07/18/2017  . Right-sided low back pain with right-sided sciatica 07/18/2017  . Abnormal chest x-ray 04/04/2017  . Tremor 04/04/2017  . Bimalleolar ankle fracture, left, sequela 10/11/2016  . Altered mental status 10/03/2016  . Neuropathy 10/03/2016  . Chronic diastolic CHF (congestive heart failure) ([place]) 10/03/2016  . Acute lower UTI   . Recurrent falls 02/02/2016  . 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 Upper GI bleed 01/23/2016  . Blood loss anemia 01/23/2016  . Acute diastolic CHF (congestive heart failure) ([place]) 01/23/2016  . Heme + stool   . Abnormal CT scan, colon   . 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 Hypoxia   . At risk for adverse drug reaction 01/18/2016  . Subcapital fracture of right hip, closed 01/12/2016  . Alteration consciousness 11/30/2015  . SVT (supraventricular tachycardia) ([place]) 11/27/2015  . Diarrhea 11/27/2015  . Depression, recurrent ([place]) 11/27/2015  . Polyneuropathy 11/27/2015  . Renal insufficiency   . Protein-calorie malnutrition, severe 11/18/2015  . Intra-abdominal fluid collection   .  Supraventricular premature beats   . Functional diarrhea   . Sepsis ([place])   . Hyperthyroidism   . Hypotension   . Septic shock ([place])   . Acute encephalopathy   . Hypokalemia   . Acute delirium 11/10/2015  . AKI (acute kidney injury) ([place]) 11/10/2015  . Anemia, iron deficiency 11/10/2015  . Anxiety 11/10/2015  . Chronic pain syndrome 11/10/2015  . Recurrent UTI 11/10/2015  . Acute respiratory failure with hypoxia ([place]) 11/10/2015  . COPD (chronic obstructive  pulmonary disease) ([place]) 11/10/2015  . Acute hyperglycemia 11/10/2015  . CKD (chronic kidney disease) stage 3, GFR 30-59 ml/min ([facility]) 11/10/2015  . Narrow complex tachycardia ([place]) 11/10/2015  . HTN (hypertension) 11/10/2015  . Fatigue 11/10/2015  . Anemia   . Delirium   . Hypoxemia   . Tachycardia     Past Surgical History:  Procedure Laterality Date  . ANKLE FRACTURE SURGERY Left   . CATARACT EXTRACTION, BILATERAL    . FLEXIBLE SIGMOIDOSCOPY Left 11/18/2015   Procedure: FLEXIBLE SIGMOIDOSCOPY;  Surgeon: [name], MD;  Location: [facility];  Service: Endoscopy;  Laterality: Left;  . FLEXIBLE SIGMOIDOSCOPY N/A 11/20/2015   Procedure: FLEXIBLE SIGMOIDOSCOPY;  Surgeon: [name], MD;  [facility] ENDOSCOPY;  Service: Endoscopy;  Laterality: N/A;  . HIP ARTHROPLASTY Right 01/12/2016   Procedure: ARTHROPLASTY BIPOLAR HIP (HEMIARTHROPLASTY);  Surgeon: [name], MD;  Location: [facility];  Service: Orthopedics;  Laterality: Right;     OB History   No obstetric history on file.     Family History  Problem Relation Age of Onset  . Heart attack Mother   . Diabetes Father   . Breast cancer Sister   . Thyroid disease Neg Hx     Social History   Tobacco Use  . Smoking status: Former Smoker    Quit date: 12/11/2015    Years since quitting: 3.9  . Smokeless tobacco: Never Used  . Tobacco comment: Smoked age [age] up to < 1 ppd, usually half a pack per day, mainly one half pack per day  Vaping Use  . Vaping Use: Never used  Substance Use Topics  . Alcohol use: No  . Drug use: No    Home Medications Prior to Admission medications   Medication Sig Start Date End Date Taking? Authorizing Provider  acetaminophen (TYLENOL) 325 MG tablet Take 650 mg by mouth every 6 (six) hours as needed.     [provider]  albuterol (PROVENTIL) (2.5 MG/3ML) 0.083% nebulizer solution Take 2.5 mg by nebulization every 6 (six) hours as needed for wheezing or shortness of breath.     [provider]  alum & mag hydroxide-simeth (MAALOX PLUS) 400-400-40 MG/5ML suspension Give 30cc PO q2H PRN x 24H (call MD if indigestion is not relieved w/i 24H)    [provider]  aspirin 81 MG chewable tablet Chew 81 mg by mouth daily.     [provider]  calcium carbonate (TUMS - DOSED IN MG ELEMENTAL CALCIUM) 500 MG chewable tablet Chew 2 tablets by mouth every 8 (eight) hours as needed for indigestion or heartburn.    [provider]  digoxin (LANOXIN) 0.125 MG tablet Take 0.125 mg by mouth daily. Hold if HR <60    [provider]  DULoxetine (CYMBALTA) 60 MG capsule Take 120 mg by mouth daily. For Depression    [provider]  Eyelid Cleansers (STERILID EX) Apply 1 each topically in the morning. Apply to closed eyelids and gently rub it in then  wipe it.    [provider]  fexofenadine (ALLEGRA) 180 MG tablet Take 180 mg by mouth daily. 06/15/19   [provider]  flecainide (TAMBOCOR) 50 MG tablet Take 50 mg by mouth every 12 (twelve) hours.    [provider]  fluticasone (FLONASE ALLERGY RELIEF) 50 MCG/ACT nasal spray Place 1 spray into both nostrils daily as needed.     [provider]  furosemide (LASIX) 20 MG tablet Take 20 mg by mouth every Monday, Wednesday, and Friday. For CHF  06/24/19   [provider]  guaiFENesin (MUCINEX) 600 MG 12 hr tablet Take 600 mg by mouth 2 (two) times daily. COPD cough    [provider]  HYDROcodone-acetaminophen (NORCO/VICODIN) 5-325 MG tablet Give **1/2 tablet by mouth q 6 hrs PRN 11/13/19   Medina-Vargas, Monina C, NP  levothyroxine (SYNTHROID) 25 MCG tablet Take 25 mcg by mouth daily before breakfast.    [provider]  Multiple Vitamins-Minerals (MULTIVITAMIN WITH MINERALS) tablet Take 1 tablet by mouth daily.    [provider]  ondansetron (ZOFRAN) 4 MG tablet Take 4 mg by mouth every 8 (eight) hours as needed for nausea.      [provider]  OXYGEN Inhale 2 L into the lungs at bedtime.    [provider]  pantoprazole (PROTONIX) 40 MG tablet Take 40 mg by mouth daily.    [provider]  polyethylene glycol (MIRALAX / GLYCOLAX) packet Take 17 g by mouth daily.    [provider]  polyvinyl alcohol (ARTIFICIAL TEARS) 1.4 % ophthalmic solution Place 2 drops into both eyes in the morning, at noon, in the evening, and at bedtime. For dry eyes     [provider]  predniSONE (DELTASONE) 5 MG tablet TAKE 1 TABLET BY MOUTH DAILY (WITH BREAKFAST) AO:ZHYQ    [provider]  primidone (MYSOLINE) 50 MG tablet Take 100 mg by mouth at bedtime.     [provider]  propranolol ER (INDERAL LA) 60 MG 24 hr capsule Take 1 capsule (60 mg total) by mouth daily. 03/14/19   Marcial Pacas, MD  sennosides-docusate sodium (SENOKOT-S) 8.6-50 MG tablet Take 1 tablet by mouth 2 (two) times daily.     [provider]  traZODone (DESYREL) 50 MG tablet Take 1/2 tab =25 mg by mouth at bedtime 11/01/18   Medina-Vargas, Monina C, NP  traZODone (DESYREL) 50 MG tablet Take 12.5 mg by mouth 2 (two) times daily. 1/4 tablet to = 12.5 mg 03/14/19   [provider]  umeclidinium bromide (INCRUSE ELLIPTA) 62.5 MCG/INH AEPB Inhale 1 puff into the lungs daily. ([place])    [provider]    Allergies    Biofreeze [menthol (topical analgesic)]  Review of Systems   Review of Systems  Constitutional: Negative for fever and weight loss.  HENT: Negative for congestion.   Eyes: Negative for visual disturbance.  Respiratory: Negative for shortness of breath.   Cardiovascular: Negative for chest pain.  Gastrointestinal: Negative for abdominal pain and bowel incontinence.  Genitourinary: Negative for bladder incontinence, difficulty urinating, dysuria and pelvic pain.  Musculoskeletal: Positive for back pain. Negative for neck stiffness.  Skin: Negative for rash.    Neurological: Negative for tingling, weakness, numbness, headaches and paresthesias.  Psychiatric/Behavioral: Negative for agitation.  All other systems reviewed and are negative.   Physical Exam Updated Vital Signs BP (!) 110/59 (BP Location: Right Arm)   Pulse 71   Temp 98.4 F (36.9 C) (  Oral)   Resp 19   SpO2 91%   Physical Exam Vitals and nursing note reviewed.  Constitutional:      General: She is not in acute distress.    Appearance: Normal appearance.  HENT:     Head: Normocephalic and atraumatic.     Nose: Nose normal.  Eyes:     Conjunctiva/sclera: Conjunctivae normal.     Pupils: Pupils are equal, round, and reactive to light.  Cardiovascular:     Rate and Rhythm: Normal rate and regular rhythm.     Pulses: Normal pulses.     Heart sounds: Normal heart sounds.  Pulmonary:     Effort: Pulmonary effort is normal.     Breath sounds: Normal breath sounds.  Abdominal:     General: Abdomen is flat. Bowel sounds are normal.     Palpations: Abdomen is soft.     Tenderness: There is no abdominal tenderness. There is no guarding or rebound.  Musculoskeletal:        General: Normal range of motion.     Cervical back: Normal, normal range of motion and neck supple.     Thoracic back: Normal.     Lumbar back: Normal.  Skin:    General: Skin is warm and dry.     Capillary Refill: Capillary refill takes less than 2 seconds.  Neurological:     General: No focal deficit present.     Mental Status: She is alert and oriented to person, place, and time.     Deep Tendon Reflexes: Reflexes normal.  Psychiatric:        Mood and Affect: Mood normal.        Behavior: Behavior normal.     ED Results / Procedures / Treatments   Labs (all labs ordered are listed, but only abnormal results are displayed) Results for orders placed or performed during the hospital encounter of 11/14/19  CBC with Differential/Platelet  Result Value Ref Range   WBC 9.1 4.0 - 10.5 K/uL   RBC  2.60 (L) 3.87 - 5.11 MIL/uL   Hemoglobin 7.3 (L) 12.0 - 15.0 g/dL   HCT 24.1 (L) 36 - 46 %   MCV 92.7 80.0 - 100.0 fL   MCH 28.1 26.0 - 34.0 pg   MCHC 30.3 30.0 - 36.0 g/dL   RDW 14.5 11.5 - 15.5 %   Platelets 288 150 - 400 K/uL   nRBC 0.0 0.0 - 0.2 %   Neutrophils Relative % 84 %   Neutro Abs 7.6 1.7 - 7.7 K/uL   Lymphocytes Relative 6 %   Lymphs Abs 0.6 (L) 0.7 - 4.0 K/uL   Monocytes Relative 8 %   Monocytes Absolute 0.8 0.1 - 1.0 K/uL   Eosinophils Relative 1 %   Eosinophils Absolute 0.1 0.0 - 0.5 K/uL   Basophils Relative 1 %   Basophils Absolute 0.1 0.0 - 0.1 K/uL   Immature Granulocytes 0 %   Abs Immature Granulocytes 0.04 0.00 - 0.07 K/uL  Comprehensive metabolic panel  Result Value Ref Range   Sodium 130 (L) 135 - 145 mmol/L   Potassium 4.6 3.5 - 5.1 mmol/L   Chloride 91 (L) 98 - 111 mmol/L   CO2 27 22 - 32 mmol/L   Glucose, Bld 102 (H) 70 - 99 mg/dL   BUN 22 8 - 23 mg/dL   Creatinine, Ser 1.26 (H) 0.44 - 1.00 mg/dL   Calcium 9.1 8.9 - 10.3 mg/dL   Total Protein 6.8 6.5 - 8.1  g/dL   Albumin 3.6 3.5 - 5.0 g/dL   AST 30 15 - 41 U/L   ALT 20 0 - 44 U/L   Alkaline Phosphatase 122 38 - 126 U/L   Total Bilirubin 0.4 0.3 - 1.2 mg/dL   GFR, Estimated 43 (L) >60 mL/min   Anion gap 12 5 - 15  Digoxin level  Result Value Ref Range   Digoxin Level 0.8 (L) 1.0 - 2.0 ng/mL  Urinalysis, Routine w reflex microscopic Urine, Clean Catch  Result Value Ref Range   Color, Urine AMBER (A) YELLOW   APPearance CLEAR CLEAR   Specific Gravity, Urine 1.023 1.005 - 1.030   pH 5.0 5.0 - 8.0   Glucose, UA NEGATIVE NEGATIVE mg/dL   Hgb urine dipstick NEGATIVE NEGATIVE   Bilirubin Urine SMALL (A) NEGATIVE   Ketones, ur 5 (A) NEGATIVE mg/dL   Protein, ur NEGATIVE NEGATIVE mg/dL   Nitrite NEGATIVE NEGATIVE   Leukocytes,Ua NEGATIVE NEGATIVE  I-stat chem 8, ED (not at [facility] or [facility])  Result Value Ref Range   Sodium 131 (L) 135 - 145 mmol/L   Potassium 4.6 3.5 - 5.1 mmol/L   Chloride 91  (L) 98 - 111 mmol/L   BUN 23 8 - 23 mg/dL   Creatinine, Ser 1.40 (H) 0.44 - 1.00 mg/dL   Glucose, Bld 100 (H) 70 - 99 mg/dL   Calcium, Ion 1.20 1.15 - 1.40 mmol/L   TCO2 32 22 - 32 mmol/L   Hemoglobin 7.5 (L) 12.0 - 15.0 g/dL   HCT 22.0 (L) 36 - 46 %  Troponin I (High Sensitivity)  Result Value Ref Range   Troponin I (High Sensitivity) 5 <18 ng/L   DG Chest Portable 1 View  Result Date: 11/14/2019 CLINICAL DATA:  Pain EXAM: PORTABLE CHEST 1 VIEW COMPARISON:  10/04/2016 FINDINGS: Rounded density projects over the right lower lung. Mild hyperinflation/COPD. Heart is borderline in size. Left basilar linear densities, likely scarring. No effusions. No acute bony abnormality. IMPRESSION: COPD, cardiomegaly. Rounded density projects over the right lower lung. This could be further evaluated with chest CT. Electronically Signed   By: Rolm Baptise M.D.   On: 11/14/2019 03:36    EKG  EKG Interpretation  Date/Time:  Thursday November 14 2019 03:30:26 EDT Ventricular Rate:  67 PR Interval:    QRS Duration: 147 QT Interval:  410 QTC Calculation: 433 R Axis:   51 Text Interpretation: Sinus rhythm Atrial premature complex Right bundle branch block non-specific ST changes diffuse unchanged Confirmed by Randal Buba, Gavon Majano ([id number]) on 11/14/2019 4:21:47 AM       Radiology No results found.  Procedures Procedures (including critical care time)  Medications Ordered in ED Medications - No data to display  ED Course  I have reviewed the triage vital signs and the nursing notes.  Pertinent labs & imaging results that were available during my care of the patient were reviewed by me and considered in my medical decision making (see chart for details).    Patient will need admission for intractable pain and low hemoglobin.   Final Clinical Impression(s) / ED Diagnoses Final diagnoses:  None   Admit to medicine    Dalyah Pla, MD 11/14/19 [phone]

## 2019-11-15 ENCOUNTER — Inpatient Hospital Stay (HOSPITAL_COMMUNITY): Payer: Medicare Other

## 2019-11-15 DIAGNOSIS — Z833 Family history of diabetes mellitus: Secondary | ICD-10-CM | POA: Diagnosis not present

## 2019-11-15 DIAGNOSIS — M4317 Spondylolisthesis, lumbosacral region: Secondary | ICD-10-CM | POA: Diagnosis not present

## 2019-11-15 DIAGNOSIS — M2550 Pain in unspecified joint: Secondary | ICD-10-CM | POA: Diagnosis not present

## 2019-11-15 DIAGNOSIS — Z87891 Personal history of nicotine dependence: Secondary | ICD-10-CM | POA: Diagnosis not present

## 2019-11-15 DIAGNOSIS — I471 Supraventricular tachycardia: Secondary | ICD-10-CM | POA: Diagnosis present

## 2019-11-15 DIAGNOSIS — Z888 Allergy status to other drugs, medicaments and biological substances status: Secondary | ICD-10-CM | POA: Diagnosis not present

## 2019-11-15 DIAGNOSIS — G893 Neoplasm related pain (acute) (chronic): Secondary | ICD-10-CM | POA: Diagnosis not present

## 2019-11-15 DIAGNOSIS — J449 Chronic obstructive pulmonary disease, unspecified: Secondary | ICD-10-CM | POA: Diagnosis present

## 2019-11-15 DIAGNOSIS — Z7401 Bed confinement status: Secondary | ICD-10-CM | POA: Diagnosis not present

## 2019-11-15 DIAGNOSIS — E0590 Thyrotoxicosis, unspecified without thyrotoxic crisis or storm: Secondary | ICD-10-CM | POA: Diagnosis present

## 2019-11-15 DIAGNOSIS — N179 Acute kidney failure, unspecified: Secondary | ICD-10-CM | POA: Diagnosis present

## 2019-11-15 DIAGNOSIS — Z9981 Dependence on supplemental oxygen: Secondary | ICD-10-CM | POA: Diagnosis not present

## 2019-11-15 DIAGNOSIS — R918 Other nonspecific abnormal finding of lung field: Secondary | ICD-10-CM

## 2019-11-15 DIAGNOSIS — I130 Hypertensive heart and chronic kidney disease with heart failure and stage 1 through stage 4 chronic kidney disease, or unspecified chronic kidney disease: Secondary | ICD-10-CM | POA: Diagnosis present

## 2019-11-15 DIAGNOSIS — G894 Chronic pain syndrome: Secondary | ICD-10-CM | POA: Diagnosis present

## 2019-11-15 DIAGNOSIS — C787 Secondary malignant neoplasm of liver and intrahepatic bile duct: Secondary | ICD-10-CM | POA: Diagnosis present

## 2019-11-15 DIAGNOSIS — D630 Anemia in neoplastic disease: Secondary | ICD-10-CM | POA: Diagnosis present

## 2019-11-15 DIAGNOSIS — R5381 Other malaise: Secondary | ICD-10-CM | POA: Diagnosis not present

## 2019-11-15 DIAGNOSIS — N183 Chronic kidney disease, stage 3 unspecified: Secondary | ICD-10-CM | POA: Diagnosis present

## 2019-11-15 DIAGNOSIS — Z79899 Other long term (current) drug therapy: Secondary | ICD-10-CM | POA: Diagnosis not present

## 2019-11-15 DIAGNOSIS — J069 Acute upper respiratory infection, unspecified: Secondary | ICD-10-CM | POA: Diagnosis not present

## 2019-11-15 DIAGNOSIS — I5032 Chronic diastolic (congestive) heart failure: Secondary | ICD-10-CM | POA: Diagnosis present

## 2019-11-15 DIAGNOSIS — M4804 Spinal stenosis, thoracic region: Secondary | ICD-10-CM | POA: Diagnosis not present

## 2019-11-15 DIAGNOSIS — R531 Weakness: Secondary | ICD-10-CM | POA: Diagnosis not present

## 2019-11-15 DIAGNOSIS — J9611 Chronic respiratory failure with hypoxia: Secondary | ICD-10-CM | POA: Diagnosis present

## 2019-11-15 DIAGNOSIS — M5136 Other intervertebral disc degeneration, lumbar region: Secondary | ICD-10-CM | POA: Diagnosis not present

## 2019-11-15 DIAGNOSIS — C3490 Malignant neoplasm of unspecified part of unspecified bronchus or lung: Secondary | ICD-10-CM | POA: Diagnosis present

## 2019-11-15 DIAGNOSIS — R195 Other fecal abnormalities: Secondary | ICD-10-CM | POA: Diagnosis not present

## 2019-11-15 DIAGNOSIS — Z87440 Personal history of urinary (tract) infections: Secondary | ICD-10-CM | POA: Diagnosis not present

## 2019-11-15 DIAGNOSIS — C229 Malignant neoplasm of liver, not specified as primary or secondary: Secondary | ICD-10-CM | POA: Diagnosis not present

## 2019-11-15 DIAGNOSIS — I700 Atherosclerosis of aorta: Secondary | ICD-10-CM | POA: Diagnosis present

## 2019-11-15 DIAGNOSIS — D1809 Hemangioma of other sites: Secondary | ICD-10-CM | POA: Diagnosis not present

## 2019-11-15 DIAGNOSIS — Z20822 Contact with and (suspected) exposure to covid-19: Secondary | ICD-10-CM | POA: Diagnosis present

## 2019-11-15 DIAGNOSIS — Z8249 Family history of ischemic heart disease and other diseases of the circulatory system: Secondary | ICD-10-CM | POA: Diagnosis not present

## 2019-11-15 DIAGNOSIS — M47814 Spondylosis without myelopathy or radiculopathy, thoracic region: Secondary | ICD-10-CM | POA: Diagnosis not present

## 2019-11-15 DIAGNOSIS — D509 Iron deficiency anemia, unspecified: Secondary | ICD-10-CM | POA: Diagnosis present

## 2019-11-15 DIAGNOSIS — C7951 Secondary malignant neoplasm of bone: Secondary | ICD-10-CM | POA: Diagnosis present

## 2019-11-15 DIAGNOSIS — C342 Malignant neoplasm of middle lobe, bronchus or lung: Secondary | ICD-10-CM | POA: Diagnosis not present

## 2019-11-15 DIAGNOSIS — Z79890 Hormone replacement therapy (postmenopausal): Secondary | ICD-10-CM | POA: Diagnosis not present

## 2019-11-15 DIAGNOSIS — R160 Hepatomegaly, not elsewhere classified: Secondary | ICD-10-CM | POA: Diagnosis not present

## 2019-11-15 DIAGNOSIS — K219 Gastro-esophageal reflux disease without esophagitis: Secondary | ICD-10-CM | POA: Diagnosis present

## 2019-11-15 LAB — BASIC METABOLIC PANEL
Anion gap: 10 (ref 5–15)
BUN: 13 mg/dL (ref 8–23)
CO2: 29 mmol/L (ref 22–32)
Calcium: 8.7 mg/dL — ABNORMAL LOW (ref 8.9–10.3)
Chloride: 95 mmol/L — ABNORMAL LOW (ref 98–111)
Creatinine, Ser: 1.01 mg/dL — ABNORMAL HIGH (ref 0.44–1.00)
GFR, Estimated: 56 mL/min — ABNORMAL LOW (ref >60)
Glucose, Bld: 82 mg/dL (ref 70–99)
Potassium: 4.5 mmol/L (ref 3.5–5.1)
Sodium: 134 mmol/L — ABNORMAL LOW (ref 135–145)

## 2019-11-15 LAB — CBC
HCT: 21.1 % — ABNORMAL LOW (ref 36.0–46.0)
Hemoglobin: 6.4 g/dL — CL (ref 12.0–15.0)
MCH: 27.8 pg (ref 26.0–34.0)
MCHC: 30.3 g/dL (ref 30.0–36.0)
MCV: 91.7 fL (ref 80.0–100.0)
Platelets: 211 10*3/uL (ref 150–400)
RBC: 2.3 MIL/uL — ABNORMAL LOW (ref 3.87–5.11)
RDW: 14.4 % (ref 11.5–15.5)
WBC: 4.5 10*3/uL (ref 4.0–10.5)
nRBC: 0 % (ref 0.0–0.2)

## 2019-11-15 LAB — PREPARE RBC (CROSSMATCH)

## 2019-11-15 LAB — PROTIME-INR
INR: 1 (ref 0.8–1.2)
Prothrombin Time: 12.8 seconds (ref 11.4–15.2)

## 2019-11-15 MED ORDER — OXYCODONE-ACETAMINOPHEN 7.5-325 MG PO TABS
2.0000 | ORAL_TABLET | ORAL | Status: DC | PRN
  Administered 2019-11-15 – 2019-11-19 (×12): 2 via ORAL
  Filled 2019-11-15 (×12): qty 2

## 2019-11-15 MED ORDER — SODIUM CHLORIDE 0.9% IV SOLUTION: Freq: Once | INTRAVENOUS | Status: DC

## 2019-11-15 NOTE — Progress Notes (Signed)
CRITICAL VALUE ALERT  Critical Value: Hemoglobin 6.4  Date & Time Notied:11/15/19  0335  Provider Notified: X. Blount, NP  Orders Received/Actions taken: Yes

## 2019-11-15 NOTE — Progress Notes (Signed)
Per patient request, called and updated daughter [name] and notified of need for blood transfusion.

## 2019-11-15 NOTE — Care Management Obs Status (Signed)
[facility] NOTIFICATION   Patient Details  Name: NAJAT OLAZABAL MRN: [id number] Date of Birth: 09-Jul-1940   Medicare Observation Status Notification Given:  Yes    Bethann Berkshire, [place] 11/15/2019, 12:48 PM

## 2019-11-15 NOTE — Progress Notes (Signed)
Patient planned for liver lesion biopsy today in IR - hgb this morning noted to be 6.4, reviewed with IR attending (Dr. Annamaria Boots) who wishes to postpone biopsy due to increased bleeding risk.  Will plan for liver lesion biopsy Monday (11/8) pending improvement in hgb. If patient is otherwise stable for discharge this can be performed as an outpatient. Ok to restart diet today from [employer] perspective.  Please call IR with questions or concerns.  Candiss Norse, PA-C

## 2019-11-15 NOTE — Progress Notes (Signed)
PROGRESS NOTE    ERCEL NORMOYLE  SNK:[id number] DOB: 11-30-1940 DOA: 11/14/2019 PCP: [name], MD  Brief Narrative  79 year old female resident of [facility]  COPD on home oxygen recurrent urinary tract infections Prior left ankle fracture 2018, hip fracture 01/2016 Prior SVT on digoxin and flecainide microcytic anemia Reflux  Prior benign sigmoid stricture related to diverticulosis diagnosed 2018 HFpEF Hyperthyroidism on methimazole  Presented to emergency room 11/14/2019 with sharp abdominal pain as well as a several second lumbar spinal pain On admission, sodium 131 BUN/creatinine 23/1.4 AST ALT 30/21 hemoglobin 7.5 CXR showed COPD CT angiogram chest pelvis showed 3 cm right middle lobe mass with right hilar adenopathy extensive metastatic parents and liver Oncology consulted IR consulted for work-up  Assessment & Plan:   Principal Problem:   Mass of middle lobe of right lung Active Problems:   COPD (chronic obstructive pulmonary disease) (HCC)   CKD (chronic kidney disease) stage 3, GFR 30-59 ml/min (HCC)   Narrow complex tachycardia (HCC)   HTN (hypertension)   SVT (supraventricular tachycardia) (HCC)   Chronic diastolic CHF (congestive heart failure) ([place])   Metastatic disease (HCC)   Chronic respiratory failure with hypoxia (HCC)   Anemia in other chronic diseases classified elsewhere   1. Metastatic ca unknown primary a. The biopsy of liver postponed until hemoglobin is improved per IR b. MRI thoracic lumbar spine ordered to rule out bony metastases--  if this is positive then I will ask radiation oncology to see c. Pain control changed to Percocet 7.5 every 4 as needed with the Dilaudid as backup d. If work-up cannot be performed in the hospital and hemoglobin remained stable with patient able to take p.o. probably can discharge home later this weekend  2. Anemia of malignancy a. No overt bleeding b. would Hemoccult next to see if there are  concerns persisting c. Likely will give IV iron on 11/6 but transfusing today for hemoglobin less than 7 d. Outpatient follow-up and consideration if this is positive then I will ask 3. aki on admit a. Improved from admission discontinue nephrotoxins  b. Would hold prior to admission Lasix Monday Wednesday Friday 20 4. COPD on home oxygen a. Stable at this time 5. CKD 3 a a. As above 6. SVT /HFpEF  a. continue flecainide 50 twice daily, digoxin 0.125 daily,Propanolol 60 ER daily 7. Hyperthyroid previously given methimazole now hypothyroid  a. continue Synthroid 25 mcg daily  DVT prophylaxis: None Code Status: .Full Family Communication: Discussed with daughter at bedside Disposition: Inpatient  Status is: Observation  The patient will require care spanning > 2 midnights and should be moved to inpatient because: Hemodynamically unstable, Persistent severe electrolyte disturbances and Ongoing active pain requiring inpatient pain management  Dispo: The patient is from: SNF              Anticipated d/c is to: SNF              Anticipated d/c date is: 2 days              Patient currently is not medically stable to d/c.  Consultants:   Oncology  IR  Procedures: None  Antimicrobials: None currently   Subjective: Awake alert coherent feels weak and does not really want to eat no chest pain no nausea since admission Pain in the lumbar spine is improved from 10/12 to about 6 or 7 out of 10 with current meds Patient is willing to try p.o. but has used oxycodone  at home according to daughter Daughter is focused mainly on his pain control  Objective: Vitals:   11/14/19 1831 11/14/19 2016 11/15/19 0156 11/15/19 0432  BP:  (!) 128/59 (!) 121/59 (!) 100/45  Pulse:  (!) 58 63 64  Resp:  17 16 16   Temp: 98 F (36.7 C) 98.2 F (36.8 C) 98.5 F (36.9 C) 98.4 F (36.9 C)  TempSrc: Rectal Oral Oral Oral  SpO2:  100% 98% 100%  Weight:      Height:        Intake/Output Summary  (Last 24 hours) at 11/15/2019 5852 Last data filed at 11/15/2019 0500 Gross per 24 hour  Intake 120 ml  Output 200 ml  Net -80 ml   Filed Weights   11/14/19 1628  Weight: 60.7 kg    Examination:  General exam: EOMI frail white female no distress Respiratory system: Clear no added sound no rales no rhonchi Cardiovascular system: S1-S2 no murmur no rub no gallop RRR seems to be in sinus on the monitor Gastrointestinal system: Soft nontender no rebound no guarding. Central nervous system: Neurologically intact Extremities: Soft Skin: No lower extremity edema Psychiatry: Euthymic pleasant  Data Reviewed: I have personally reviewed following labs and imaging studies  Na 134 k 4.5 Bun/creat 23/1.4--->13/1.01 Iron 17, Sat 5 Hemoglobin 7.5-->6.4   Radiology Studies: DG Chest Portable 1 View  Result Date: 11/14/2019 CLINICAL DATA:  Pain EXAM: PORTABLE CHEST 1 VIEW COMPARISON:  10/04/2016 FINDINGS: Rounded density projects over the right lower lung. Mild hyperinflation/COPD. Heart is borderline in size. Left basilar linear densities, likely scarring. No effusions. No acute bony abnormality. IMPRESSION: COPD, cardiomegaly. Rounded density projects over the right lower lung. This could be further evaluated with chest CT. Electronically Signed   By: Rolm Baptise M.D.   On: 11/14/2019 03:36   CT Angio Chest/Abd/Pel for Dissection W and/or Wo Contrast  Result Date: 11/14/2019 CLINICAL DATA:  Abdominal pain with aortic dissection suspected EXAM: CT ANGIOGRAPHY CHEST, ABDOMEN AND PELVIS TECHNIQUE: Non-contrast CT of the chest was initially obtained. Multidetector CT imaging through the chest, abdomen and pelvis was performed using the standard protocol during bolus administration of intravenous contrast. Multiplanar reconstructed images and MIPs were obtained and reviewed to evaluate the vascular anatomy. CONTRAST:  47mL OMNIPAQUE IOHEXOL 350 MG/ML SOLN COMPARISON:  Abdomen and pelvis CT  01/26/2016 FINDINGS: CTA CHEST FINDINGS Cardiovascular: No intramural hematoma on the noncontrast phase. Cardiomegaly without pericardial effusion. Large appearance of the right heart and central pulmonary arteries, suggesting pulmonary hypertension. No aortic dissection or aneurysm. Aortic and coronary atherosclerosis. Mediastinum/Nodes: Right hilar/bronchial adenopathy with 15 mm diameter node. No enlarged mediastinal nodes are seen. Negative esophagus. Lungs/Pleura: Lobulated 3 cm mass in the right middle lobe. Emphysema and scratch the emphysema. There is no edema, consolidation, effusion, or pneumothorax. Musculoskeletal: No acute or aggressive finding. Degeneration of the thoracic spine. Review of the MIP images confirms the above findings. CTA ABDOMEN AND PELVIS FINDINGS VASCULAR Aorta: Diffuse atheromatous plaque.  No dissection or aneurysm. Celiac: Plaque at the ostium without flow limiting stenosis. No branch occlusion or beading. SMA: Atheromatous plaque at the ostium. No branch occlusion or beading. Renals: Atheromatous plaque at both proximal renal arteries with at least moderate narrowing on the right. Negative for aneurysm or dissection. IMA: Small, patent vessel. Inflow: Multifocal atheromatous plaque without flow limiting stenosis, aneurysm, or dissection Veins: Negative in the arterial phase Review of the MIP images confirms the above findings. NON-VASCULAR Hepatobiliary: Hypodense masses throughout the liver, measuring up  to approximately 3 cm. Negative gallbladder and biliary tree. Pancreas: Small calcifications at the pancreatic head. Negative for pancreatitis or visible mass. Spleen: Negative Adrenals/Urinary Tract: Negative adrenals. Patchy renal cortical scarring greater on the right at the upper pole. Renal cystic densities measuring up to 4.9 cm on the left. Stomach/Bowel: Negative for obstruction or visible inflammation. Colonic fluid levels reach the distal colon. There are a few areas of  luminal narrowing without discrete mass, likely from luminal collapse. Lymphatic: Negative for adenopathy. Reproductive: Hysterectomy and pelvic floor laxity Other: No ascites or pneumoperitoneum Musculoskeletal: Right hip arthroplasty. No acute or aggressive finding. Prominent lumbar spine degeneration with L5-S1 anterolisthesis and mild scoliosis. Review of the MIP images confirms the above findings. IMPRESSION: 1. 3 cm right middle lobe mass with right hilar adenopathy and extensive metastatic appearance in the liver. 2. No evidence of acute aortic syndrome. 3. Aortic Atherosclerosis (ICD10-I70.0) and Emphysema (ICD10-J43.9). Electronically Signed   By: Monte Fantasia M.D.   On: 11/14/2019 05:21     Scheduled Meds: . sodium chloride   Intravenous Once  . aspirin  81 mg Oral Daily  . digoxin  0.125 mg Oral Daily  . DULoxetine  120 mg Oral Daily  . flecainide  50 mg Oral BID  . fluticasone  1 spray Each Nare Daily  . furosemide  20 mg Oral Q M,W,F  . guaiFENesin  600 mg Oral BID  . levothyroxine  25 mcg Oral QAC breakfast  . loratadine  10 mg Oral Daily  . multivitamin with minerals  1 tablet Oral Daily  . pantoprazole  40 mg Oral Daily  . polyethylene glycol  17 g Oral Daily  . polyvinyl alcohol  2 drop Both Eyes TID  . predniSONE  5 mg Oral Q breakfast  . primidone  100 mg Oral QHS  . propranolol ER  60 mg Oral Daily  . senna-docusate  1 tablet Oral BID  . sodium chloride flush  3 mL Intravenous Q12H  . traZODone  25 mg Oral QHS  . umeclidinium bromide  1 puff Inhalation Daily   Continuous Infusions: . sodium chloride 250 mL (11/14/19 1832)     LOS: 0 days    Time spent: 30  Nita Sells, MD [facility] To contact the attending provider between 7A-7P or the covering provider during after hours 7P-7A, please log into the web site [email or web address] and access using universal [place] password for that web site. If you do not have the password, please call the  hospital operator.  11/15/2019, 8:19 AM

## 2019-11-16 DIAGNOSIS — R918 Other nonspecific abnormal finding of lung field: Secondary | ICD-10-CM | POA: Diagnosis not present

## 2019-11-16 LAB — COMPREHENSIVE METABOLIC PANEL
ALT: 18 U/L (ref 0–44)
AST: 37 U/L (ref 15–41)
Albumin: 3 g/dL — ABNORMAL LOW (ref 3.5–5.0)
Alkaline Phosphatase: 121 U/L (ref 38–126)
Anion gap: 8 (ref 5–15)
BUN: 12 mg/dL (ref 8–23)
CO2: 32 mmol/L (ref 22–32)
Calcium: 8.5 mg/dL — ABNORMAL LOW (ref 8.9–10.3)
Chloride: 95 mmol/L — ABNORMAL LOW (ref 98–111)
Creatinine, Ser: 0.99 mg/dL (ref 0.44–1.00)
GFR, Estimated: 58 mL/min — ABNORMAL LOW (ref >60)
Glucose, Bld: 83 mg/dL (ref 70–99)
Potassium: 4.4 mmol/L (ref 3.5–5.1)
Sodium: 135 mmol/L (ref 135–145)
Total Bilirubin: 0.8 mg/dL (ref 0.3–1.2)
Total Protein: 5.9 g/dL — ABNORMAL LOW (ref 6.5–8.1)

## 2019-11-16 LAB — CBC WITH DIFFERENTIAL/PLATELET
Abs Immature Granulocytes: 0.01 10*3/uL (ref 0.00–0.07)
Basophils Absolute: 0 10*3/uL (ref 0.0–0.1)
Basophils Relative: 1 %
Eosinophils Absolute: 0.1 10*3/uL (ref 0.0–0.5)
Eosinophils Relative: 2 %
HCT: 29.5 % — ABNORMAL LOW (ref 36.0–46.0)
Hemoglobin: 9.6 g/dL — ABNORMAL LOW (ref 12.0–15.0)
Immature Granulocytes: 0 %
Lymphocytes Relative: 12 %
Lymphs Abs: 0.5 10*3/uL — ABNORMAL LOW (ref 0.7–4.0)
MCH: 29.1 pg (ref 26.0–34.0)
MCHC: 32.5 g/dL (ref 30.0–36.0)
MCV: 89.4 fL (ref 80.0–100.0)
Monocytes Absolute: 0.8 10*3/uL (ref 0.1–1.0)
Monocytes Relative: 16 %
Neutro Abs: 3.2 10*3/uL (ref 1.7–7.7)
Neutrophils Relative %: 69 %
Platelets: 202 10*3/uL (ref 150–400)
RBC: 3.3 MIL/uL — ABNORMAL LOW (ref 3.87–5.11)
RDW: 14.6 % (ref 11.5–15.5)
WBC: 4.7 10*3/uL (ref 4.0–10.5)
nRBC: 0 % (ref 0.0–0.2)

## 2019-11-16 LAB — TYPE AND SCREEN
ABO/RH(D): O POS
Antibody Screen: NEGATIVE
Unit division: 0
Unit division: 0

## 2019-11-16 LAB — BPAM RBC
Blood Product Expiration Date: 202111302359
Blood Product Expiration Date: 202112012359
ISSUE DATE / TIME: 202111051123
ISSUE DATE / TIME: 202111051736
Unit Type and Rh: 5100
Unit Type and Rh: 5100

## 2019-11-16 MED ORDER — SODIUM CHLORIDE 0.9 % IV SOLN
510.0000 mg | Freq: Once | INTRAVENOUS | Status: AC
  Administered 2019-11-16: 510 mg via INTRAVENOUS
  Filled 2019-11-16: qty 17

## 2019-11-16 NOTE — Progress Notes (Signed)
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  Presented to emergency room 11/14/2019 with sharp abdominal pain as well as a several second lumbar spinal pain On admission, sodium 131 BUN/creatinine 23/1.4 AST ALT 30/21 hemoglobin 7.5 CXR showed COPD CT angiogram chest pelvis showed 3 cm right middle lobe mass with right hilar adenopathy extensive metastatic parents and liver Oncology consulted IR consulted for work-up  Assessment & Plan:   Principal Problem:   Mass of middle lobe of right lung Active Problems:   COPD (chronic obstructive pulmonary disease) (HCC)   CKD (chronic kidney disease) stage 3, GFR 30-59 ml/min (HCC)   Narrow complex tachycardia (HCC)   HTN (hypertension)   SVT (supraventricular tachycardia) (HCC)   Chronic diastolic CHF (congestive heart failure) ([place])   Metastatic disease (HCC)   Chronic respiratory failure with hypoxia (HCC)   Anemia in other chronic diseases classified elsewhere   1. Metastatic ca unknown primary a. The biopsy of liver postponed until hemoglobin is improved per IR b. MRI thoracic lumbar spine shows concerns of metastases in L3-L4 spine 2.4 cm 8 mm met in S1 and METS which are very small in T8 and T9 c. Pain control changed to Percocet 7.5 every 4 as needed with the Dilaudid as backup although she has not been using the Dilaudid d. Ambulate patient if possible and to get therapy to see her e. If pain is severe and uncontrolled we will need to adjust pain management and may need to speak to radiation oncology about a radiation  of the area for symptomatic control 2. Anemia of malignancy a. No overt bleeding b. Transfused on 11/5 2 units PR VC with acceptable rise c. Give Feraheme 11/6 d. Outpatient follow-up and consideration if Hemoccult is positive for colonoscopy 3. aki on admit a. Improved from admission discontinue nephrotoxins  b. Would hold prior to admission Lasix Monday Wednesday Friday 20 4. COPD on home oxygen a. Stable at this time 5. CKD 3 a a. As above 6. SVT /HFpEF  a. continue flecainide 50 twice daily, digoxin 0.125 daily,Propanolol 60 ER daily 7. Hyperthyroid previously given methimazole now hypothyroid  a. continue Synthroid 25 mcg daily  DVT prophylaxis: None Code Status: .Full Family Communication: No family present called daughter on phone Disposition: Inpatient  Status is: Observation  The patient will require care spanning > 2 midnights and should be moved to inpatient because: Hemodynamically unstable, Persistent severe electrolyte disturbances and Ongoing active pain requiring inpatient pain management  Dispo: The patient is from: SNF              Anticipated d/c is to: SNF              Anticipated d/c date is: 2 days              Patient currently is not medically stable to d/c.  Consultants:   Oncology  IR  Procedures: None  Antimicrobials: None currently   Subjective: Coherent pleasant no distress Quite sleepy but states pain is better 6/7 on 10 No nausea no vomiting Has not been out of bed since admission  Objective: Vitals:   11/15/19  1927 11/15/19 2030 11/16/19 0431 11/16/19 0827  BP: (!) 119/58 (!) 122/59 (!) 120/58   Pulse: (!) 57 (!) 56 (!) 57 (!) 56  Resp: '16 17 17 16  ' Temp: 98.2 F (36.8 C) 98.1 F (36.7 C) 98 F (36.7 C)   TempSrc: Oral Oral Oral   SpO2: 100% 100% 100% 100%  Weight:      Height:        Intake/Output Summary (Last 24 hours) at 11/16/2019 0935 Last data filed at 11/16/2019 0400 Gross per 24 hour  Intake 1275.01 ml  Output  525 ml  Net 750.01 ml   Filed Weights   11/14/19 1628  Weight: 60.7 kg    Examination:  Awake coherent no distress sleepy Chest clear no added sound no rales no rhonchi Abdomen soft no rebound no guarding ROM intact lower extremities--straight leg raise is painful   Data Reviewed: I have personally reviewed following labs and imaging studies  Na 134-->135 k 4.5-->4.4 Bun/creat 23/1.4--->13/1.01-->12/0.9 Hemoglobin 7.5-->6.4-->2 units PRBC--Transend 9.6 White count 4.7   Radiology Studies: MR THORACIC SPINE WO CONTRAST  Result Date: 11/15/2019 CLINICAL DATA:  79 year old female with CTA chest abdomen and pelvis yesterday revealing right middle lobe lung mass with evidence of right hilar and hepatic metastatic disease. Staging. EXAM: MRI THORACIC SPINE WITHOUT CONTRAST TECHNIQUE: Multiplanar, multisequence MR imaging of the thoracic spine was performed. No intravenous contrast was administered. COMPARISON:  CTA chest abdomen and pelvis 01/14/2019. FINDINGS: Limited cervical spine imaging: Extensive cervical disc and endplate degeneration. No obvious cervical vertebral metastatic disease. Thoracic spine segmentation:  Normal. Alignment: Stable thoracic kyphosis and minimal dextroconvex scoliosis from the CTA yesterday. No significant spondylolisthesis. Vertebrae: Complete replacement of the T8 vertebral body by tumor (series 38, image 10). No loss of T8 vertebral body height and this is occult by CT. There is also a subtle T1 hypointense and STIR hyperintense 6 mm area in the left T9 inferior articulating facet suspicious for small metastasis, although atypical signal associated with benign hemangioma is also possible. Benign hemangioma with typical signal noted in the T11 body. Thoracic vertebral marrow signal elsewhere is within normal limits. Grossly normal visible posterior ribs. No other marrow edema or acute osseous abnormality. Incidental degenerative thoracic endplate Schmorl's nodes.  Cord: Normal. Capacious thoracic spinal canal at most levels. Conus medullaris at T12-L1. Paraspinal and other soft tissues: Stable visible thoracic and upper abdominal viscera from yesterday. No extraosseous extension of tumor at T8. The ventral epidural space is preserved at this time. Other thoracic paraspinal soft tissues remain within normal limits. Disc levels: Disc degeneration throughout the thoracic spine. Occasional small disc herniations (such as at T7-T8 on the left series 40, image 20) without degenerative spinal stenosis. Degenerative thoracic neural foraminal stenosis does occur in part due to facet arthropathy, and is severe on the left at the T9 and T10 nerve levels. IMPRESSION: 1. T8 metastasis completely replacing normal marrow vertebral body. No epidural or extraosseous extension. 2. Subtle 6 mm metastasis versus hemangioma of bone in the left T9 inferior articulating facet. 3. No other osseous metastatic disease identified in the thoracic spine. 4. Thoracic spine degeneration concordant with age not resulting in thoracic spinal stenosis. Electronically Signed   By: Genevie Ann M.D.   On: 11/15/2019 17:32   MR LUMBAR SPINE WO CONTRAST  Result Date: 11/15/2019 CLINICAL DATA:  79 year old female with CTA chest abdomen and pelvis yesterday revealing right middle lobe lung mass with evidence of right hilar and hepatic metastatic disease. Staging. EXAM:  MRI LUMBAR SPINE WITHOUT CONTRAST TECHNIQUE: Multiplanar, multisequence MR imaging of the lumbar spine was performed. No intravenous contrast was administered. COMPARISON:  CTA chest abdomen and pelvis yesterday. Thoracic MRI today reported separately. Lumbar MRI 11/22/2017. FINDINGS: Segmentation: Normal. This is concordant with thoracic numbering today, and is the same numbering system used on the 2019 MRI. Alignment: Stable lumbar lordosis and levoconvex lumbar scoliosis since the 2019 MRI. Stable mild grade 1 anterolisthesis of L5 on S1. Vertebrae:  There is an 18 mm round metastasis in the anterior L3 vertebral body on series 6, image 10. No extraosseous extension. There is a moderate size 2.4 cm metastasis of the right L4 pedicle and superior articulating facet (series 6, image 5 and series 8, image 13). No extraosseous or epidural extension of tumor from this lesion (series 7, image 13). Small L4 superior endplate Schmorl's node on series 6, image 8 is stable. As is a chronic L2 superior endplate Schmorl's node on image 9. Questionable subtle L1 vertebral body metastasis on image 11, could be artifact. No other lumbar vertebral metastasis identified. Lumbar marrow signal elsewhere is stable since 2019. A small 8 mm right S1 sacral ala metastasis is identified on series 8, image 22, and also on sagittal images, and is new since 2019. Elsewhere stable visible sacrum and medial iliac bones. Conus medullaris and cauda equina: Conus extends to the T12-L1 level. No lower spinal cord or conus signal abnormality. Paraspinal and other soft tissues: Stable visible abdominal and pelvic viscera. Lumbar paraspinal soft tissues remain within normal limits. Disc levels: Widespread lumbar spine degeneration has not significantly changed since the 2019 MRI. IMPRESSION: 1. L3 vertebral body and right L4 pedicle metastases in the lumbar spine, the latter measuring 2.4 cm. Questionable tiny L1 vertebral body metastasis also. No extraosseous or epidural extension of tumor. 2. Small 8 mm right sacral ala metastasis at S1. 3. Underlying lumbar spine degeneration not significantly changed since 2019. Electronically Signed   By: Genevie Ann M.D.   On: 11/15/2019 17:30     Scheduled Meds: . sodium chloride   Intravenous Once  . aspirin  81 mg Oral Daily  . digoxin  0.125 mg Oral Daily  . DULoxetine  120 mg Oral Daily  . flecainide  50 mg Oral BID  . fluticasone  1 spray Each Nare Daily  . guaiFENesin  600 mg Oral BID  . levothyroxine  25 mcg Oral QAC breakfast  . loratadine   10 mg Oral Daily  . multivitamin with minerals  1 tablet Oral Daily  . pantoprazole  40 mg Oral Daily  . polyethylene glycol  17 g Oral Daily  . polyvinyl alcohol  2 drop Both Eyes TID  . predniSONE  5 mg Oral Q breakfast  . primidone  100 mg Oral QHS  . propranolol ER  60 mg Oral Daily  . senna-docusate  1 tablet Oral BID  . sodium chloride flush  3 mL Intravenous Q12H  . traZODone  25 mg Oral QHS  . umeclidinium bromide  1 puff Inhalation Daily   Continuous Infusions: . sodium chloride Stopped (11/15/19 2244)  . ferumoxytol       LOS: 1 day    Time spent: [place], MD [facility] To contact the attending provider between 7A-7P or the covering provider during after hours 7P-7A, please log into the web site [email or web address] and access using universal [place] password for that web site. If you do not have the password, please call  the hospital operator.  11/16/2019, 9:35 AM

## 2019-11-17 DIAGNOSIS — R918 Other nonspecific abnormal finding of lung field: Secondary | ICD-10-CM | POA: Diagnosis not present

## 2019-11-17 LAB — COMPREHENSIVE METABOLIC PANEL
ALT: 20 U/L (ref 0–44)
AST: 37 U/L (ref 15–41)
Albumin: 2.9 g/dL — ABNORMAL LOW (ref 3.5–5.0)
Alkaline Phosphatase: 133 U/L — ABNORMAL HIGH (ref 38–126)
Anion gap: 8 (ref 5–15)
BUN: 9 mg/dL (ref 8–23)
CO2: 32 mmol/L (ref 22–32)
Calcium: 8.7 mg/dL — ABNORMAL LOW (ref 8.9–10.3)
Chloride: 95 mmol/L — ABNORMAL LOW (ref 98–111)
Creatinine, Ser: 0.95 mg/dL (ref 0.44–1.00)
GFR, Estimated: >60 mL/min (ref >60)
Glucose, Bld: 90 mg/dL (ref 70–99)
Potassium: 4.3 mmol/L (ref 3.5–5.1)
Sodium: 135 mmol/L (ref 135–145)
Total Bilirubin: 1 mg/dL (ref 0.3–1.2)
Total Protein: 5.8 g/dL — ABNORMAL LOW (ref 6.5–8.1)

## 2019-11-17 LAB — CBC WITH DIFFERENTIAL/PLATELET
Abs Immature Granulocytes: 0.01 10*3/uL (ref 0.00–0.07)
Basophils Absolute: 0 10*3/uL (ref 0.0–0.1)
Basophils Relative: 0 %
Eosinophils Absolute: 0.2 10*3/uL (ref 0.0–0.5)
Eosinophils Relative: 4 %
HCT: 32 % — ABNORMAL LOW (ref 36.0–46.0)
Hemoglobin: 10 g/dL — ABNORMAL LOW (ref 12.0–15.0)
Immature Granulocytes: 0 %
Lymphocytes Relative: 9 %
Lymphs Abs: 0.4 10*3/uL — ABNORMAL LOW (ref 0.7–4.0)
MCH: 28.6 pg (ref 26.0–34.0)
MCHC: 31.3 g/dL (ref 30.0–36.0)
MCV: 91.4 fL (ref 80.0–100.0)
Monocytes Absolute: 0.6 10*3/uL (ref 0.1–1.0)
Monocytes Relative: 14 %
Neutro Abs: 3.4 10*3/uL (ref 1.7–7.7)
Neutrophils Relative %: 73 %
Platelets: 189 10*3/uL (ref 150–400)
RBC: 3.5 MIL/uL — ABNORMAL LOW (ref 3.87–5.11)
RDW: 14.5 % (ref 11.5–15.5)
WBC: 4.6 10*3/uL (ref 4.0–10.5)
nRBC: 0 % (ref 0.0–0.2)

## 2019-11-17 LAB — OCCULT BLOOD X 1 CARD TO LAB, STOOL: Fecal Occult Bld: POSITIVE — AB

## 2019-11-17 NOTE — Evaluation (Signed)
Physical Therapy Evaluation Patient Details Name: Dawn [name] MRN: [id number] DOB: Jun 13, 1940 Today's Date: 11/17/2019   History of Present Illness   79 y.o. female with medical history significant for COPD with chronic respiratory failure on 3 L of oxygen, chronic pain syndrome, stage III chronic kidney disease, hypertension, history of spinal stenosis, hypothyroidism who was brought into the emergency room by EMS for evaluation of severe pain in her lower thoracic spine/upper lumbar spine. CT angiogram of chest/abdomen/pelvis showed 3 cm right middle lobe mass with right hilar adenopathy and extensive metastatic appearance in the liver. PT also found to have spinal metastases on MRI.  Clinical Impression  Pt presents to PT with deficits in functional mobility, gait, balance, strength, power, endurance, and with significant back pain. Pt is limited by pain at this time, aborting transfer attempts mid-way due to back pain. Pt perseverates over back pain, stating multiple times the she "doesn't want her pain to get bad". After mobilizing pt declines pain medicine however. Pt will benefit from continued acute PT services in an attempt to improve activity tolerance and mobility quality while reducing falls risk. PT recommends a return to SNF.    Follow Up Recommendations SNF (return to [facility])    Equipment Recommendations  None recommended by PT (DME at [facility])    Recommendations for Other Services       Precautions / Restrictions Precautions Precautions: Fall Restrictions Weight Bearing Restrictions: No      Mobility  Bed Mobility Overal bed mobility: Needs Assistance Bed Mobility: Rolling;Supine to Sit;Sit to Supine Rolling: Supervision   Supine to sit: Supervision Sit to supine: Supervision        Transfers Overall transfer level: Needs assistance Equipment used: Rolling walker (2 wheeled) Transfers: Sit to/from Stand Sit to Stand: Max assist         General  transfer comment: attempted transfer but pt aborts attempt 2/2 pain during 2 trials  Ambulation/Gait                Stairs            Wheelchair Mobility    Modified Rankin (Stroke Patients Only)       Balance Overall balance assessment: Needs assistance Sitting-balance support: Single extremity supported;Feet supported;No upper extremity supported Sitting balance-Leahy Scale: Fair Sitting balance - Comments: close supervision without UE support for static sitting                                     Pertinent Vitals/Pain Pain Assessment: Faces Faces Pain Scale: Hurts whole lot Pain Location: back Pain Descriptors / Indicators: Grimacing Pain Intervention(s): Monitored during session    Home Living Family/patient expects to be discharged to:: Skilled nursing facility                      Prior Function Level of Independence: Needs assistance   Gait / Transfers Assistance Needed: pt reports ambulating very short distances to bathroom with walker, otherwise transferring from bed to wheelchair and mobilizing independently in wheelchair  ADL's / Homemaking Assistance Needed: pt reports bathing and dressing herself, does not complete IADLs        Hand Dominance   Dominant Hand: Right    Extremity/Trunk Assessment   Upper Extremity Assessment Upper Extremity Assessment: Generalized weakness    Lower Extremity Assessment Lower Extremity Assessment: Generalized weakness    Cervical / Trunk Assessment Cervical /  Trunk Assessment: Kyphotic  Communication   Communication: No difficulties  Cognition Arousal/Alertness: Awake/alert Behavior During Therapy: WFL for tasks assessed/performed Overall Cognitive Status: Within Functional Limits for tasks assessed                                        General Comments General comments (skin integrity, edema, etc.): VSS on 3L [place]    Exercises     Assessment/Plan     PT Assessment Patient needs continued PT services  PT Problem List Decreased strength;Decreased activity tolerance;Decreased balance;Decreased mobility;Decreased knowledge of use of DME;Decreased knowledge of precautions;Pain       PT Treatment Interventions DME instruction;Functional mobility training;Therapeutic activities;Therapeutic exercise;Balance training;Neuromuscular re-education;Patient/family education;Wheelchair mobility training    PT Goals (Current goals can be found in the Care Plan section)  Acute Rehab PT Goals Patient Stated Goal: To reduce pain PT Goal Formulation: With patient Time For Goal Achievement: 12/01/19 Potential to Achieve Goals: Poor Additional Goals Additional Goal #1: Pt will mobilize in manual wheelchair for 50' with supervision    Frequency Min 2X/week   Barriers to discharge        Co-evaluation               AM-PAC PT "6 Clicks" Mobility  Outcome Measure Help needed turning from your back to your side while in a flat bed without using bedrails?: None Help needed moving from lying on your back to sitting on the side of a flat bed without using bedrails?: None Help needed moving to and from a bed to a chair (including a wheelchair)?: Total Help needed standing up from a chair using your arms (e.g., wheelchair or bedside chair)?: Total Help needed to walk in hospital room?: Total Help needed climbing 3-5 steps with a railing? : Total 6 Click Score: 12    End of Session   Activity Tolerance: Patient limited by pain Patient left: in bed;with call bell/phone within reach;with bed alarm set Nurse Communication: Mobility status PT Visit Diagnosis: Unsteadiness on feet (R26.81);Other abnormalities of gait and mobility (R26.89);Pain Pain - part of body:  (back)    Time: 3546-5681 PT Time Calculation (min) (ACUTE ONLY): 12 min   Charges:   PT Evaluation $PT Eval Moderate Complexity: 1 Mod          Zenaida Niece, PT, DPT Acute  Rehabilitation Pager: [phone]   Zenaida Niece 11/17/2019, 1:57 PM

## 2019-11-17 NOTE — Progress Notes (Signed)
   Pt is scheduled for liver lesion biopsy in Radiology 11/8  Hg stable; 10.0 today Will recheck again in am  Pt has been seen and consented for procedure

## 2019-11-17 NOTE — Progress Notes (Signed)
PROGRESS NOTE    Dawn [name]  PXT:[id number] DOB: 10-30-1940 DOA: 11/14/2019 PCP: [name], MD  Brief Narrative  79 year old female resident of [facility]  COPD on home oxygen recurrent urinary tract infections Prior left ankle fracture 2018, hip fracture 01/2016 Prior SVT on digoxin and flecainide microcytic anemia Reflux  Prior benign sigmoid stricture related to diverticulosis diagnosed 2018 HFpEF Hyperthyroidism on methimazole  Presented to emergency room 11/14/2019 with sharp abdominal pain as well as a several second lumbar spinal pain On admission, sodium 131 BUN/creatinine 23/1.4 AST ALT 30/21 hemoglobin 7.5 CXR showed COPD CT angiogram chest pelvis showed 3 cm right middle lobe mass with right hilar adenopathy extensive metastatic parents and liver Oncology consulted IR consulted for work-up  Assessment & Plan:   Principal Problem:   Mass of middle lobe of right lung Active Problems:   COPD (chronic obstructive pulmonary disease) (HCC)   CKD (chronic kidney disease) stage 3, GFR 30-59 ml/min (HCC)   Narrow complex tachycardia (HCC)   HTN (hypertension)   SVT (supraventricular tachycardia) (HCC)   Chronic diastolic CHF (congestive heart failure) ([place])   Metastatic disease (HCC)   Chronic respiratory failure with hypoxia (HCC)   Anemia in other chronic diseases classified elsewhere   1. Metastatic ca unknown primary a. Liver biopsy scheduled 11/18/2019 b. MRI thoracic lumbar spine shows concerns of metastases in L3-L4 spine 2.4 cm 8 mm met in S1 and METS which are very small in T8 and T9 she is not ambulating states her pain is severe-- c. Pain control changed to Percocet 7.5 every 4 as needed with the Dilaudid as backup although she has not been using the Dilaudid since 11/6 2. Anemia of malignancy a. No overt bleeding b. Transfused on 11/5 2 units PR VC with acceptable rise c. Give Feraheme 11/6 d. Hemoccult stool and check for  blood 3. aki on admit a. Improved from admission discontinue nephrotoxins  b. Would hold prior to admission Lasix Monday Wednesday Friday 20 4. COPD on home oxygen a. Stable at this time 5. CKD 3 a a. As above 6. SVT /HFpEF  a. continue flecainide 50 twice daily, digoxin 0.125 daily,Propanolol 60 ER daily b. Sinus rhythm on monitors 7. Hyperthyroid previously given methimazole now hypothyroid  a. continue Synthroid 25 mcg daily  DVT prophylaxis: None Code Status: .Full Family Communication: No family present called daughter on phone and discussed in detail on 11/6 Disposition: Inpatient  Status is: Observation  The patient will require care spanning > 2 midnights and should be moved to inpatient because: Hemodynamically unstable, Persistent severe electrolyte disturbances and Ongoing active pain requiring inpatient pain management  Dispo: The patient is from: SNF              Anticipated d/c is to: SNF              Anticipated d/c date is: 2 days              Patient currently is not medically stable to d/c.  Consultants:   Oncology  IR  Procedures: None  Antimicrobials: None currently   Subjective:  Sleepy, states that the pain is occasionally 10/10 but right now it is controlled Unwilling to get out of bed unclear if she is worked with therapy Not really eating or drinking waiting for daughter to bring her some food Some right foot pain and also complaining of headache  Objective: Vitals:   11/17/19 0407 11/17/19 0748 11/17/19 0800 11/17/19 6270  BP: 125/60  123/63 (!) 133/58  Pulse: (!) 50  (!) 54 (!) 50  Resp: 16  18   Temp: 97.6 F (36.4 C)  97.9 F (36.6 C)   TempSrc:   Oral   SpO2: 100% 99% 100% 100%  Weight:      Height:        Intake/Output Summary (Last 24 hours) at 11/17/2019 1012 Last data filed at 11/17/2019 0410 Gross per 24 hour  Intake 0 ml  Output 700 ml  Net -700 ml   Filed Weights   11/14/19 1628  Weight: 60.7 kg     Examination:  Awake coherent Chest clear no rales no rhonchi on nasal cannula Abdomen soft no rebound no guarding ROM intact Neurologically intact no focal deficit   Data Reviewed: I have personally reviewed following labs and imaging studies  Na 134-->135 K 4.5-->4.3 Bun/creat 23/1.4--->13/1.01--> 9/0.9 Hemoglobin 7.5-->6.4-->2 units PRBC--10.0 White count 4.6   Radiology Studies: MR THORACIC SPINE WO CONTRAST  Result Date: 11/15/2019 CLINICAL DATA:  79 year old female with CTA chest abdomen and pelvis yesterday revealing right middle lobe lung mass with evidence of right hilar and hepatic metastatic disease. Staging. EXAM: MRI THORACIC SPINE WITHOUT CONTRAST TECHNIQUE: Multiplanar, multisequence MR imaging of the thoracic spine was performed. No intravenous contrast was administered. COMPARISON:  CTA chest abdomen and pelvis 01/14/2019. FINDINGS: Limited cervical spine imaging: Extensive cervical disc and endplate degeneration. No obvious cervical vertebral metastatic disease. Thoracic spine segmentation:  Normal. Alignment: Stable thoracic kyphosis and minimal dextroconvex scoliosis from the CTA yesterday. No significant spondylolisthesis. Vertebrae: Complete replacement of the T8 vertebral body by tumor (series 38, image 10). No loss of T8 vertebral body height and this is occult by CT. There is also a subtle T1 hypointense and STIR hyperintense 6 mm area in the left T9 inferior articulating facet suspicious for small metastasis, although atypical signal associated with benign hemangioma is also possible. Benign hemangioma with typical signal noted in the T11 body. Thoracic vertebral marrow signal elsewhere is within normal limits. Grossly normal visible posterior ribs. No other marrow edema or acute osseous abnormality. Incidental degenerative thoracic endplate Schmorl's nodes. Cord: Normal. Capacious thoracic spinal canal at most levels. Conus medullaris at T12-L1. Paraspinal and  other soft tissues: Stable visible thoracic and upper abdominal viscera from yesterday. No extraosseous extension of tumor at T8. The ventral epidural space is preserved at this time. Other thoracic paraspinal soft tissues remain within normal limits. Disc levels: Disc degeneration throughout the thoracic spine. Occasional small disc herniations (such as at T7-T8 on the left series 40, image 20) without degenerative spinal stenosis. Degenerative thoracic neural foraminal stenosis does occur in part due to facet arthropathy, and is severe on the left at the T9 and T10 nerve levels. IMPRESSION: 1. T8 metastasis completely replacing normal marrow vertebral body. No epidural or extraosseous extension. 2. Subtle 6 mm metastasis versus hemangioma of bone in the left T9 inferior articulating facet. 3. No other osseous metastatic disease identified in the thoracic spine. 4. Thoracic spine degeneration concordant with age not resulting in thoracic spinal stenosis. Electronically Signed   By: Genevie Ann M.D.   On: 11/15/2019 17:32   MR LUMBAR SPINE WO CONTRAST  Result Date: 11/15/2019 CLINICAL DATA:  79 year old female with CTA chest abdomen and pelvis yesterday revealing right middle lobe lung mass with evidence of right hilar and hepatic metastatic disease. Staging. EXAM: MRI LUMBAR SPINE WITHOUT CONTRAST TECHNIQUE: Multiplanar, multisequence MR imaging of the lumbar spine was performed. No intravenous  contrast was administered. COMPARISON:  CTA chest abdomen and pelvis yesterday. Thoracic MRI today reported separately. Lumbar MRI 11/22/2017. FINDINGS: Segmentation: Normal. This is concordant with thoracic numbering today, and is the same numbering system used on the 2019 MRI. Alignment: Stable lumbar lordosis and levoconvex lumbar scoliosis since the 2019 MRI. Stable mild grade 1 anterolisthesis of L5 on S1. Vertebrae: There is an 18 mm round metastasis in the anterior L3 vertebral body on series 6, image 10. No  extraosseous extension. There is a moderate size 2.4 cm metastasis of the right L4 pedicle and superior articulating facet (series 6, image 5 and series 8, image 13). No extraosseous or epidural extension of tumor from this lesion (series 7, image 13). Small L4 superior endplate Schmorl's node on series 6, image 8 is stable. As is a chronic L2 superior endplate Schmorl's node on image 9. Questionable subtle L1 vertebral body metastasis on image 11, could be artifact. No other lumbar vertebral metastasis identified. Lumbar marrow signal elsewhere is stable since 2019. A small 8 mm right S1 sacral ala metastasis is identified on series 8, image 22, and also on sagittal images, and is new since 2019. Elsewhere stable visible sacrum and medial iliac bones. Conus medullaris and cauda equina: Conus extends to the T12-L1 level. No lower spinal cord or conus signal abnormality. Paraspinal and other soft tissues: Stable visible abdominal and pelvic viscera. Lumbar paraspinal soft tissues remain within normal limits. Disc levels: Widespread lumbar spine degeneration has not significantly changed since the 2019 MRI. IMPRESSION: 1. L3 vertebral body and right L4 pedicle metastases in the lumbar spine, the latter measuring 2.4 cm. Questionable tiny L1 vertebral body metastasis also. No extraosseous or epidural extension of tumor. 2. Small 8 mm right sacral ala metastasis at S1. 3. Underlying lumbar spine degeneration not significantly changed since 2019. Electronically Signed   By: Genevie Ann M.D.   On: 11/15/2019 17:30     Scheduled Meds: . sodium chloride   Intravenous Once  . aspirin  81 mg Oral Daily  . digoxin  0.125 mg Oral Daily  . DULoxetine  120 mg Oral Daily  . flecainide  50 mg Oral BID  . fluticasone  1 spray Each Nare Daily  . guaiFENesin  600 mg Oral BID  . levothyroxine  25 mcg Oral QAC breakfast  . loratadine  10 mg Oral Daily  . multivitamin with minerals  1 tablet Oral Daily  . pantoprazole  40 mg  Oral Daily  . polyethylene glycol  17 g Oral Daily  . polyvinyl alcohol  2 drop Both Eyes TID  . predniSONE  5 mg Oral Q breakfast  . primidone  100 mg Oral QHS  . propranolol ER  60 mg Oral Daily  . senna-docusate  1 tablet Oral BID  . sodium chloride flush  3 mL Intravenous Q12H  . traZODone  25 mg Oral QHS  . umeclidinium bromide  1 puff Inhalation Daily   Continuous Infusions: . sodium chloride Stopped (11/15/19 2244)     LOS: 2 days    Time spent: [place], MD [facility] To contact the attending provider between 7A-7P or the covering provider during after hours 7P-7A, please log into the web site [email or web address] and access using universal [place] password for that web site. If you do not have the password, please call the hospital operator.  11/17/2019, 10:12 AM

## 2019-11-17 NOTE — Progress Notes (Signed)
Stool sent earlier and was positive for blood, Dr. Verlon Au notified.

## 2019-11-18 ENCOUNTER — Inpatient Hospital Stay (HOSPITAL_COMMUNITY): Payer: Medicare Other

## 2019-11-18 ENCOUNTER — Ambulatory Visit
Admit: 2019-11-18 | Discharge: 2019-11-18 | Disposition: A | Discharge status: Home / Self Care | Payer: Medicare Other | Attending: Radiation Oncology | Admitting: Radiation Oncology

## 2019-11-18 ENCOUNTER — Other Ambulatory Visit: Payer: Self-pay

## 2019-11-18 DIAGNOSIS — C3491 Malignant neoplasm of unspecified part of right bronchus or lung: Secondary | ICD-10-CM | POA: Insufficient documentation

## 2019-11-18 DIAGNOSIS — C7951 Secondary malignant neoplasm of bone: Secondary | ICD-10-CM

## 2019-11-18 DIAGNOSIS — C342 Malignant neoplasm of middle lobe, bronchus or lung: Secondary | ICD-10-CM | POA: Insufficient documentation

## 2019-11-18 DIAGNOSIS — R918 Other nonspecific abnormal finding of lung field: Secondary | ICD-10-CM | POA: Diagnosis not present

## 2019-11-18 DIAGNOSIS — G893 Neoplasm related pain (acute) (chronic): Secondary | ICD-10-CM | POA: Insufficient documentation

## 2019-11-18 LAB — CBC WITH DIFFERENTIAL/PLATELET
Abs Immature Granulocytes: 0.02 10*3/uL (ref 0.00–0.07)
Basophils Absolute: 0 10*3/uL (ref 0.0–0.1)
Basophils Relative: 1 %
Eosinophils Absolute: 0.2 10*3/uL (ref 0.0–0.5)
Eosinophils Relative: 2 %
HCT: 33.1 % — ABNORMAL LOW (ref 36.0–46.0)
Hemoglobin: 10.2 g/dL — ABNORMAL LOW (ref 12.0–15.0)
Immature Granulocytes: 0 %
Lymphocytes Relative: 9 %
Lymphs Abs: 0.6 10*3/uL — ABNORMAL LOW (ref 0.7–4.0)
MCH: 28.6 pg (ref 26.0–34.0)
MCHC: 30.8 g/dL (ref 30.0–36.0)
MCV: 92.7 fL (ref 80.0–100.0)
Monocytes Absolute: 1 10*3/uL (ref 0.1–1.0)
Monocytes Relative: 14 %
Neutro Abs: 5.1 10*3/uL (ref 1.7–7.7)
Neutrophils Relative %: 74 %
Platelets: 212 10*3/uL (ref 150–400)
RBC: 3.57 MIL/uL — ABNORMAL LOW (ref 3.87–5.11)
RDW: 14.4 % (ref 11.5–15.5)
WBC: 6.9 10*3/uL (ref 4.0–10.5)
nRBC: 0 % (ref 0.0–0.2)

## 2019-11-18 LAB — COMPREHENSIVE METABOLIC PANEL
ALT: 22 U/L (ref 0–44)
AST: 43 U/L — ABNORMAL HIGH (ref 15–41)
Albumin: 2.9 g/dL — ABNORMAL LOW (ref 3.5–5.0)
Alkaline Phosphatase: 140 U/L — ABNORMAL HIGH (ref 38–126)
Anion gap: 9 (ref 5–15)
BUN: 12 mg/dL (ref 8–23)
CO2: 35 mmol/L — ABNORMAL HIGH (ref 22–32)
Calcium: 8.8 mg/dL — ABNORMAL LOW (ref 8.9–10.3)
Chloride: 94 mmol/L — ABNORMAL LOW (ref 98–111)
Creatinine, Ser: 1.11 mg/dL — ABNORMAL HIGH (ref 0.44–1.00)
GFR, Estimated: 50 mL/min — ABNORMAL LOW (ref >60)
Glucose, Bld: 128 mg/dL — ABNORMAL HIGH (ref 70–99)
Potassium: 4.1 mmol/L (ref 3.5–5.1)
Sodium: 138 mmol/L (ref 135–145)
Total Bilirubin: 0.7 mg/dL (ref 0.3–1.2)
Total Protein: 5.8 g/dL — ABNORMAL LOW (ref 6.5–8.1)

## 2019-11-18 MED ORDER — PROPRANOLOL HCL 20 MG PO TABS
20.0000 mg | ORAL_TABLET | Freq: Two times a day (BID) | ORAL | Status: DC
  Administered 2019-11-18 – 2019-11-19 (×2): 20 mg via ORAL
  Filled 2019-11-18 (×2): qty 1

## 2019-11-18 MED ORDER — MIDAZOLAM HCL 2 MG/2ML IJ SOLN
INTRAMUSCULAR | Status: AC | PRN
  Administered 2019-11-18: 1 mg via INTRAVENOUS

## 2019-11-18 MED ORDER — LIDOCAINE HCL (PF) 1 % IJ SOLN
INTRAMUSCULAR | Status: AC
  Filled 2019-11-18: qty 30

## 2019-11-18 MED ORDER — FENTANYL CITRATE (PF) 100 MCG/2ML IJ SOLN
INTRAMUSCULAR | Status: AC
  Filled 2019-11-18: qty 2

## 2019-11-18 MED ORDER — MIDAZOLAM HCL 2 MG/2ML IJ SOLN
INTRAMUSCULAR | Status: AC
  Filled 2019-11-18: qty 2

## 2019-11-18 MED ORDER — GELATIN ABSORBABLE 12-7 MM EX MISC
CUTANEOUS | Status: AC
  Filled 2019-11-18: qty 1

## 2019-11-18 MED ORDER — FENTANYL CITRATE (PF) 100 MCG/2ML IJ SOLN
INTRAMUSCULAR | Status: AC | PRN
Start: 2019-11-18 — End: 2019-11-18
  Administered 2019-11-18: 25 ug via INTRAVENOUS

## 2019-11-18 NOTE — Consult Note (Signed)
   [facility] CM Inpatient Consult   11/18/2019  Prichard 12/29/1940 [id number]   Patient screened for high risk score for unplanned readmission. Chart reviewed to assess for potential [place] [facility] community service needs. Per review, patient is resident at [facility] and current disposition is for return to [place]. No [facility] Management follow up needs.  Netta Cedars, MSN, [facility] Liaison Nurse Mobile Phone [phone]  Toll free office [phone]

## 2019-11-18 NOTE — Consult Note (Signed)
Radiation Oncology         [phone] ________________________________  Name: Dawn [name]        MRN: [id number]  Date of Service: 11/18/19 DOB: Sep 05, 1940  CC:[name], MD    REFERRING PHYSICIAN: Dr. Verlon Au  DIAGNOSIS: The primary encounter diagnosis was Iron deficiency anemia, unspecified iron deficiency anemia type. Diagnoses of Malignant neoplasm metastatic to liver [facility]) and Liver metastases ([place]) were also pertinent to this visit.   HISTORY OF PRESENT ILLNESS: Dawn [name] is a 79 y.o. female seen at the request of Dr. Verlon Au for a probable Stage IV lung cancer. The patient has multiple comorbidities and resides in a skilled facility where she requires assistance with many ADLs. She has had some modest weight loss in the last few months, though details are unknown. She presented via EMS from her facility given progressive mid back pain. Work up in the ED on 11/14/19 showed concern for a new 3 cm mass in the RML of the lung with hilar adenopathy and extensive metastatic disease in the liver. She had an MRI of the lumbar and thoracic spine and a large mass at T8 that replaces the whole vertebral body without cord compromise. She also has an 18 mm round metastatic lesion at L3, and also a 2.4 cm mass in the right L4 pedicle. She does not have cord compromise in the lumbar spine. She also had some dark stool and hemocult is positive. She has gone for her liver biopsy this morning, but we've been consulted to consider palliative radiotherapy for her back pain.  PREVIOUS RADIATION THERAPY: No   PAST MEDICAL HISTORY:  Past Medical History:  Diagnosis Date  . Anemia   . Back pain 11/14/2019  . Chronic diastolic heart failure ([place])   . Chronic kidney disease, stage 3 ([place])   . Chronic pain syndrome   . Constipation   . COPD (chronic obstructive pulmonary disease) ([place])   . Dysphagia   . Frequent falls   . Generalized muscle weakness   . GERD (gastroesophageal  reflux disease)   . Headache   . Hip pain, right   . History of anxiety   . History of depression   . Hyperglycemia   . Hypothyroid   . Insomnia   . Metabolic encephalopathy   . Metastatic disease ([place]) 11/14/2019  . Palpitation   . Polyneuropathy   . Spinal stenosis   . Spinal stenosis, cervical region   . Supraventricular tachycardia ([place])   . Thyrotoxicosis, unspecified with thyrotoxic crisis or storm   . Tremor   . Unsteadiness on feet   . UTI (urinary tract infection)        PAST SURGICAL HISTORY: Past Surgical History:  Procedure Laterality Date  . ANKLE FRACTURE SURGERY Left   . CATARACT EXTRACTION, BILATERAL    . FLEXIBLE SIGMOIDOSCOPY Left 11/18/2015   Procedure: FLEXIBLE SIGMOIDOSCOPY;  Surgeon: [name], MD;  Location: [facility];  Service: Endoscopy;  Laterality: Left;  . FLEXIBLE SIGMOIDOSCOPY N/A 11/20/2015   Procedure: FLEXIBLE SIGMOIDOSCOPY;  Surgeon: [name], MD;  Location: [facility];  Service: Endoscopy;  Laterality: N/A;  . HIP ARTHROPLASTY Right 01/12/2016   Procedure: ARTHROPLASTY BIPOLAR HIP (HEMIARTHROPLASTY);  Surgeon: [name], MD;  Location: [facility];  Service: Orthopedics;  Laterality: Right;     FAMILY HISTORY:  Family History  Problem Relation Age of Onset  . Heart attack Mother   . Diabetes Father   . Breast cancer Sister   . Thyroid  disease Neg Hx      SOCIAL HISTORY:  reports that she quit smoking about 3 years ago. She has never used smokeless tobacco. She reports that she does not drink alcohol and does not use drugs. The patient is divorced and she lives in [place] rehabilitation for about 3 years since a hip fracture. 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    ALLERGIES: Biofreeze [menthol (topical analgesic)]   MEDICATIONS:  Current Facility-Administered Medications  Medication Dose Route Frequency Provider Last Rate Last Admin  . 0.9 %  sodium chloride infusion (Manually program via Guardrails IV Fluids)    Intravenous Once Blount, Scarlette Shorts T, NP      . 0.9 %  sodium chloride infusion  250 mL Intravenous PRN Collier Bullock, MD   Stopped at 11/15/19 2244  . acetaminophen (TYLENOL) tablet 650 mg  650 mg Oral Q6H PRN Agbata, Tochukwu, MD      . albuterol (PROVENTIL) (2.5 MG/3ML) 0.083% nebulizer solution 2.5 mg  2.5 mg Nebulization Q6H PRN Agbata, Tochukwu, MD      . aspirin chewable tablet 81 mg  81 mg Oral Daily Agbata, Tochukwu, MD   81 mg at 11/17/19 1042  . calcium carbonate (TUMS - dosed in mg elemental calcium) chewable tablet 400 mg of elemental calcium  2 tablet Oral Q8H PRN Agbata, Tochukwu, MD      . digoxin (LANOXIN) tablet 0.125 mg  0.125 mg Oral Daily Agbata, Tochukwu, MD   0.125 mg at 11/15/19 0957  . DULoxetine (CYMBALTA) DR capsule 120 mg  120 mg Oral Daily Agbata, Tochukwu, MD   120 mg at 11/17/19 1040  . flecainide (TAMBOCOR) tablet 50 mg  50 mg Oral BID Agbata, Tochukwu, MD   50 mg at 11/17/19 2009  . fluticasone (FLONASE) 50 MCG/ACT nasal spray 1 spray  1 spray Each Nare Daily Agbata, Tochukwu, MD   1 spray at 11/17/19 1045  . guaiFENesin (MUCINEX) 12 hr tablet 600 mg  600 mg Oral BID Agbata, Tochukwu, MD   600 mg at 11/17/19 2009  . HYDROmorphone (DILAUDID) injection 1 mg  1 mg Intravenous Q4H PRN Agbata, Tochukwu, MD   1 mg at 11/16/19 0952  . levothyroxine (SYNTHROID) tablet 25 mcg  25 mcg Oral QAC breakfast Agbata, Tochukwu, MD   25 mcg at 11/18/19 0635  . loratadine (CLARITIN) tablet 10 mg  10 mg Oral Daily Agbata, Tochukwu, MD   10 mg at 11/17/19 1038  . multivitamin with minerals tablet 1 tablet  1 tablet Oral Daily [name], MD   1 tablet at 11/17/19 1038  . ondansetron (ZOFRAN) tablet 4 mg  4 mg Oral Q6H PRN Agbata, Tochukwu, MD       Or  . ondansetron (ZOFRAN) injection 4 mg  4 mg Intravenous Q6H PRN Agbata, Tochukwu, MD      . oxyCODONE-acetaminophen (PERCOCET) 7.5-325 MG per tablet 2 tablet  2 tablet Oral Q4H PRN Nita Sells, MD   2 tablet at 11/18/19  0344  . pantoprazole (PROTONIX) EC tablet 40 mg  40 mg Oral Daily Agbata, Tochukwu, MD   40 mg at 11/17/19 1039  . polyethylene glycol (MIRALAX / GLYCOLAX) packet 17 g  17 g Oral Daily Agbata, Tochukwu, MD   17 g at 11/17/19 1046  . polyvinyl alcohol (LIQUIFILM TEARS) 1.4 % ophthalmic solution 2 drop  2 drop Both Eyes TID Agbata, Tochukwu, MD   2 drop at 11/17/19 2009  . predniSONE (DELTASONE) tablet 5 mg  5 mg  Oral Q breakfast Agbata, Tochukwu, MD   5 mg at 11/17/19 1042  . primidone (MYSOLINE) tablet 100 mg  100 mg Oral QHS Agbata, Tochukwu, MD   100 mg at 11/17/19 2009  . propranolol ER (INDERAL LA) 24 hr capsule 60 mg  60 mg Oral Daily Agbata, Tochukwu, MD   60 mg at 11/17/19 1538  . senna-docusate (Senokot-S) tablet 1 tablet  1 tablet Oral BID [name], MD   1 tablet at 11/17/19 2009  . sodium chloride flush (NS) 0.9 % injection 3 mL  3 mL Intravenous Q12H Agbata, Tochukwu, MD   3 mL at 11/17/19 2013  . sodium chloride flush (NS) 0.9 % injection 3 mL  3 mL Intravenous PRN Agbata, Tochukwu, MD      . traZODone (DESYREL) tablet 25 mg  25 mg Oral QHS Agbata, Tochukwu, MD   25 mg at 11/17/19 2009  . umeclidinium bromide (INCRUSE ELLIPTA) 62.5 MCG/INH 1 puff  1 puff Inhalation Daily Agbata, Tochukwu, MD   1 puff at 11/17/19 0748     REVIEW OF SYSTEMS: On review of systems, the patient reports that she is having pain in her back particularly in her low back. She denies any loss of sensation, numbness or tingling or burning pain. She states her pain is also more diffuse throughout the spine. No other complaints are verbalized.     PHYSICAL EXAM:  Wt Readings from Last 3 Encounters:  11/14/19 133 lb 13.1 oz (60.7 kg)  11/05/19 133 lb (60.3 kg)  10/18/19 131 lb 9.6 oz (59.7 kg)   Temp Readings from Last 3 Encounters:  11/18/19 98.2 F (36.8 C) (Oral)  11/05/19 (!) 97.5 F (36.4 C) (Oral)  10/18/19 (!) 97.5 F (36.4 C) (Oral)   BP Readings from Last 3 Encounters:  11/18/19  122/65  11/05/19 134/68  10/18/19 128/70   Pulse Readings from Last 3 Encounters:  11/18/19 (!) 51  11/05/19 68  10/18/19 61   Pain Assessment Pain Score: Asleep/10  Unable to assess given encounter type.  ECOG = 4  0 - Asymptomatic (Fully active, able to carry on all predisease activities without restriction)  1 - Symptomatic but completely ambulatory (Restricted in physically strenuous activity but ambulatory and able to carry out work of a light or sedentary nature. For example, light housework, office work)  2 - Symptomatic, <50% in bed during the day (Ambulatory and capable of all self care but unable to carry out any work activities. Up and about more than 50% of waking hours)  3 - Symptomatic, >50% in bed, but not bedbound (Capable of only limited self-care, confined to bed or chair 50% or more of waking hours)  4 - Bedbound (Completely disabled. Cannot carry on any self-care. Totally confined to bed or chair)  5 - Death   Eustace Pen MM, Creech RH, Tormey DC, et al. [phone]). "Toxicity and response criteria of the [facility] Group". [place] Oncol. 5 (6): 649-55    LABORATORY DATA:  Lab Results  Component Value Date   WBC 6.9 11/18/2019   HGB 10.2 (L) 11/18/2019   HCT 33.1 (L) 11/18/2019   MCV 92.7 11/18/2019   PLT 212 11/18/2019   Lab Results  Component Value Date   NA 138 11/18/2019   K 4.1 11/18/2019   CL 94 (L) 11/18/2019   CO2 35 (H) 11/18/2019   Lab Results  Component Value Date   ALT 22 11/18/2019   AST 43 (H) 11/18/2019  ALKPHOS 140 (H) 11/18/2019   BILITOT 0.7 11/18/2019      RADIOGRAPHY: MR THORACIC SPINE WO CONTRAST  Result Date: 11/15/2019 CLINICAL DATA:  79 year old female with CTA chest abdomen and pelvis yesterday revealing right middle lobe lung mass with evidence of right hilar and hepatic metastatic disease. Staging. EXAM: MRI THORACIC SPINE WITHOUT CONTRAST TECHNIQUE: Multiplanar, multisequence MR imaging of the  thoracic spine was performed. No intravenous contrast was administered. COMPARISON:  CTA chest abdomen and pelvis 01/14/2019. FINDINGS: Limited cervical spine imaging: Extensive cervical disc and endplate degeneration. No obvious cervical vertebral metastatic disease. Thoracic spine segmentation:  Normal. Alignment: Stable thoracic kyphosis and minimal dextroconvex scoliosis from the CTA yesterday. No significant spondylolisthesis. Vertebrae: Complete replacement of the T8 vertebral body by tumor (series 38, image 10). No loss of T8 vertebral body height and this is occult by CT. There is also a subtle T1 hypointense and STIR hyperintense 6 mm area in the left T9 inferior articulating facet suspicious for small metastasis, although atypical signal associated with benign hemangioma is also possible. Benign hemangioma with typical signal noted in the T11 body. Thoracic vertebral marrow signal elsewhere is within normal limits. Grossly normal visible posterior ribs. No other marrow edema or acute osseous abnormality. Incidental degenerative thoracic endplate Schmorl's nodes. Cord: Normal. Capacious thoracic spinal canal at most levels. Conus medullaris at T12-L1. Paraspinal and other soft tissues: Stable visible thoracic and upper abdominal viscera from yesterday. No extraosseous extension of tumor at T8. The ventral epidural space is preserved at this time. Other thoracic paraspinal soft tissues remain within normal limits. Disc levels: Disc degeneration throughout the thoracic spine. Occasional small disc herniations (such as at T7-T8 on the left series 40, image 20) without degenerative spinal stenosis. Degenerative thoracic neural foraminal stenosis does occur in part due to facet arthropathy, and is severe on the left at the T9 and T10 nerve levels. IMPRESSION: 1. T8 metastasis completely replacing normal marrow vertebral body. No epidural or extraosseous extension. 2. Subtle 6 mm metastasis versus hemangioma of  bone in the left T9 inferior articulating facet. 3. No other osseous metastatic disease identified in the thoracic spine. 4. Thoracic spine degeneration concordant with age not resulting in thoracic spinal stenosis. Electronically Signed   By: Genevie Ann M.D.   On: 11/15/2019 17:32   MR LUMBAR SPINE WO CONTRAST  Result Date: 11/15/2019 CLINICAL DATA:  79 year old female with CTA chest abdomen and pelvis yesterday revealing right middle lobe lung mass with evidence of right hilar and hepatic metastatic disease. Staging. EXAM: MRI LUMBAR SPINE WITHOUT CONTRAST TECHNIQUE: Multiplanar, multisequence MR imaging of the lumbar spine was performed. No intravenous contrast was administered. COMPARISON:  CTA chest abdomen and pelvis yesterday. Thoracic MRI today reported separately. Lumbar MRI 11/22/2017. FINDINGS: Segmentation: Normal. This is concordant with thoracic numbering today, and is the same numbering system used on the 2019 MRI. Alignment: Stable lumbar lordosis and levoconvex lumbar scoliosis since the 2019 MRI. Stable mild grade 1 anterolisthesis of L5 on S1. Vertebrae: There is an 18 mm round metastasis in the anterior L3 vertebral body on series 6, image 10. No extraosseous extension. There is a moderate size 2.4 cm metastasis of the right L4 pedicle and superior articulating facet (series 6, image 5 and series 8, image 13). No extraosseous or epidural extension of tumor from this lesion (series 7, image 13). Small L4 superior endplate Schmorl's node on series 6, image 8 is stable. As is a chronic L2 superior endplate Schmorl's node on image 9.  Questionable subtle L1 vertebral body metastasis on image 11, could be artifact. No other lumbar vertebral metastasis identified. Lumbar marrow signal elsewhere is stable since 2019. A small 8 mm right S1 sacral ala metastasis is identified on series 8, image 22, and also on sagittal images, and is new since 2019. Elsewhere stable visible sacrum and medial iliac bones.  Conus medullaris and cauda equina: Conus extends to the T12-L1 level. No lower spinal cord or conus signal abnormality. Paraspinal and other soft tissues: Stable visible abdominal and pelvic viscera. Lumbar paraspinal soft tissues remain within normal limits. Disc levels: Widespread lumbar spine degeneration has not significantly changed since the 2019 MRI. IMPRESSION: 1. L3 vertebral body and right L4 pedicle metastases in the lumbar spine, the latter measuring 2.4 cm. Questionable tiny L1 vertebral body metastasis also. No extraosseous or epidural extension of tumor. 2. Small 8 mm right sacral ala metastasis at S1. 3. Underlying lumbar spine degeneration not significantly changed since 2019. Electronically Signed   By: Genevie Ann M.D.   On: 11/15/2019 17:30   DG Chest Portable 1 View  Result Date: 11/14/2019 CLINICAL DATA:  Pain EXAM: PORTABLE CHEST 1 VIEW COMPARISON:  10/04/2016 FINDINGS: Rounded density projects over the right lower lung. Mild hyperinflation/COPD. Heart is borderline in size. Left basilar linear densities, likely scarring. No effusions. No acute bony abnormality. IMPRESSION: COPD, cardiomegaly. Rounded density projects over the right lower lung. This could be further evaluated with chest CT. Electronically Signed   By: Rolm Baptise M.D.   On: 11/14/2019 03:36   CT Angio Chest/Abd/Pel for Dissection W and/or Wo Contrast  Result Date: 11/14/2019 CLINICAL DATA:  Abdominal pain with aortic dissection suspected EXAM: CT ANGIOGRAPHY CHEST, ABDOMEN AND PELVIS TECHNIQUE: Non-contrast CT of the chest was initially obtained. Multidetector CT imaging through the chest, abdomen and pelvis was performed using the standard protocol during bolus administration of intravenous contrast. Multiplanar reconstructed images and MIPs were obtained and reviewed to evaluate the vascular anatomy. CONTRAST:  51mL OMNIPAQUE IOHEXOL 350 MG/ML SOLN COMPARISON:  Abdomen and pelvis CT 01/26/2016 FINDINGS: CTA CHEST  FINDINGS Cardiovascular: No intramural hematoma on the noncontrast phase. Cardiomegaly without pericardial effusion. Large appearance of the right heart and central pulmonary arteries, suggesting pulmonary hypertension. No aortic dissection or aneurysm. Aortic and coronary atherosclerosis. Mediastinum/Nodes: Right hilar/bronchial adenopathy with 15 mm diameter node. No enlarged mediastinal nodes are seen. Negative esophagus. Lungs/Pleura: Lobulated 3 cm mass in the right middle lobe. Emphysema and scratch the emphysema. There is no edema, consolidation, effusion, or pneumothorax. Musculoskeletal: No acute or aggressive finding. Degeneration of the thoracic spine. Review of the MIP images confirms the above findings. CTA ABDOMEN AND PELVIS FINDINGS VASCULAR Aorta: Diffuse atheromatous plaque.  No dissection or aneurysm. Celiac: Plaque at the ostium without flow limiting stenosis. No branch occlusion or beading. SMA: Atheromatous plaque at the ostium. No branch occlusion or beading. Renals: Atheromatous plaque at both proximal renal arteries with at least moderate narrowing on the right. Negative for aneurysm or dissection. IMA: Small, patent vessel. Inflow: Multifocal atheromatous plaque without flow limiting stenosis, aneurysm, or dissection Veins: Negative in the arterial phase Review of the MIP images confirms the above findings. NON-VASCULAR Hepatobiliary: Hypodense masses throughout the liver, measuring up to approximately 3 cm. Negative gallbladder and biliary tree. Pancreas: Small calcifications at the pancreatic head. Negative for pancreatitis or visible mass. Spleen: Negative Adrenals/Urinary Tract: Negative adrenals. Patchy renal cortical scarring greater on the right at the upper pole. Renal cystic densities measuring up to 4.9  cm on the left. Stomach/Bowel: Negative for obstruction or visible inflammation. Colonic fluid levels reach the distal colon. There are a few areas of luminal narrowing without  discrete mass, likely from luminal collapse. Lymphatic: Negative for adenopathy. Reproductive: Hysterectomy and pelvic floor laxity Other: No ascites or pneumoperitoneum Musculoskeletal: Right hip arthroplasty. No acute or aggressive finding. Prominent lumbar spine degeneration with L5-S1 anterolisthesis and mild scoliosis. Review of the MIP images confirms the above findings. IMPRESSION: 1. 3 cm right middle lobe mass with right hilar adenopathy and extensive metastatic appearance in the liver. 2. No evidence of acute aortic syndrome. 3. Aortic Atherosclerosis (ICD10-I70.0) and Emphysema (ICD10-J43.9). Electronically Signed   By: Monte Fantasia M.D.   On: 11/14/2019 05:21       IMPRESSION/PLAN: 1. Probable Stage IV lung cancer with bony metastases. Dr. Lisbeth Renshaw has reviewed her imaging and work up to date. Dr. Lindi Adie has already discussed that systemic therapy would not be appropriate unless her disease was breast cancer. However it appears more consistent with lung cancer by imaging. We will await the results of her liver biopsy which is scheduled for today. She appears to be a good candidate for a course of palliative radiotherapy to the thoracic (T8) and lumbar (L3-4) spine given her symptoms.  If her inpt team thinks she will need to remain inpt status, we can proceed with carelink to [facility] tomorrow and begin treatment on Thursday.  We discussed the risks, benefits, short, and long term effects of radiotherapy, and the patient is interested in proceeding. Dr. Lisbeth Renshaw discusses the delivery and logistics of radiotherapy and anticipates a course of 5 fractions of radiotherapy to the thoracic and lumbar spine targets. She will consent for treatment at the time of simulation. I did review with her daughter that they would benefit from a goals of care discussion with palliative medicine and with oncology, given that she is not a candidate for chemotherapy in the setting of metastatic cancer. 2. Pain  secondary to #1. We appreciate the pain medication recommendations by her inpt team. Hopefully we will see improvement in her symptoms with the addition of radiotherapy. 3. Positive Hemocult. We will defer further work up of this to the primary team.   In a visit lasting 60 minutes, greater than 50% of the time was spent by phone and in floor time discussing the patient's condition, in preparation for the discussion, and coordinating the patient's care.     Carola Rhine, PAC

## 2019-11-18 NOTE — NC FL2 (Signed)
[place] LEVEL OF CARE SCREENING TOOL     IDENTIFICATION  Patient Name: Dawn [name] Birthdate: [birth year] Sex: female Admission Date (Current Location): 11/14/2019  [facility] and [place] Number:  Herbalist and Address:  [facility] [place] [address] [place], [place] [address]      Provider Number: [id number]  Attending Physician Name and Address:  Nita Sells, MD  Relative Name and Phone Number:  Delene Ruffini (Daughter) [phone] [facility])    Current Level of Care: Hospital Recommended Level of Care: [place] Prior Approval Number:    Date Approved/Denied: 11/18/19 PASRR Number: [id number]  Discharge Plan: SNF    Current Diagnoses: Patient Active Problem List   Diagnosis Date Noted  . 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 Pain from bone metastases ([place]) 11/18/2019  . Bone metastases ([place]) 11/14/2019  . Chronic respiratory failure with hypoxia ([place]) 11/14/2019  . Anemia in other chronic diseases classified elsewhere 11/14/2019  . Mass of middle lobe of right lung 11/14/2019  . Chronic intractable headache 03/14/2019  . Essential tremor 03/14/2019  . Osteoarthritis 11/01/2018  . Somnolence, daytime 11/01/2018  . Constipation 10/05/2018  . Poor appetite 10/05/2018  . Palliative care patient 08/09/2018  . Hypothyroidism 07/24/2018  . Seasonal allergies 06/30/2018  . Chronic radicular cervical pain 04/26/2018  . Chronic right-sided low back pain with right-sided sciatica 03/21/2018  . Spinal stenosis 11/23/2017  . Headache 08/31/2017  . Elevated TSH 08/31/2017  . Pain in right hip 07/18/2017  . Right-sided low back pain with right-sided sciatica 07/18/2017  . Abnormal chest x-ray 04/04/2017  . Tremor 04/04/2017  . Bimalleolar ankle fracture, left, sequela 10/11/2016  . Altered mental status 10/03/2016  . Neuropathy 10/03/2016  . Chronic  diastolic CHF (congestive heart failure) ([place]) 10/03/2016  . Acute lower UTI   . Recurrent falls 02/02/2016  . 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 Upper GI bleed 01/23/2016  . Blood loss anemia 01/23/2016  . Acute diastolic CHF (congestive heart failure) ([place]) 01/23/2016  . Heme + stool   . Abnormal CT scan, colon   . Paroxysmal junctional tachycardia ([place])   . Hypoxia   . At risk for adverse drug reaction 01/18/2016  . Subcapital fracture of right hip, closed 01/12/2016  . Alteration consciousness 11/30/2015  . SVT (supraventricular tachycardia) ([place]) 11/27/2015  . Diarrhea 11/27/2015  . Depression, recurrent ([place]) 11/27/2015  . Polyneuropathy 11/27/2015  . Renal insufficiency   . Protein-calorie malnutrition, severe 11/18/2015  . Intra-abdominal fluid collection   . Supraventricular premature beats   . Functional diarrhea   . Sepsis ([place])   . Hyperthyroidism   . Hypotension   . Septic shock ([place])   . Acute encephalopathy   . Hypokalemia   . Acute delirium 11/10/2015  . AKI (acute kidney injury) ([place]) 11/10/2015  . Anemia, iron deficiency 11/10/2015  . Anxiety 11/10/2015  . Chronic pain syndrome 11/10/2015  . Recurrent UTI 11/10/2015  . Acute respiratory failure with hypoxia ([place]) 11/10/2015  . COPD (chronic obstructive pulmonary disease) ([place]) 11/10/2015  . Acute hyperglycemia 11/10/2015  . CKD (chronic kidney disease) stage 3, GFR 30-59 ml/min (HCC) 11/10/2015  . Narrow complex tachycardia ([place]) 11/10/2015  . HTN (hypertension) 11/10/2015  . Fatigue 11/10/2015  . Anemia   . Delirium   . Hypoxemia   . Tachycardia     Orientation RESPIRATION BLADDER Height & Weight     Self, Time, Situation, Place  O2 (3L/min) External catheter Weight: 133 lb 13.1 oz (60.7 kg) Height:  5\' 8"  (172.7 cm)  BEHAVIORAL SYMPTOMS/MOOD NEUROLOGICAL BOWEL NUTRITION STATUS      Continent Diet (see d/c summary)  AMBULATORY STATUS COMMUNICATION OF NEEDS Skin   Extensive Assist  Verbally Surgical wounds (Puncture, abdomen, liver biopsy)                       Personal Care Assistance Level of Assistance  Bathing, Feeding, Dressing Bathing Assistance: Limited assistance Feeding assistance: Independent Dressing Assistance: Limited assistance     Functional Limitations Info  Sight, Speech, Hearing Sight Info: Adequate Hearing Info: Adequate Speech Info: Adequate    SPECIAL CARE FACTORS FREQUENCY  PT (By licensed PT), OT (By licensed OT)     PT Frequency: 5x/week OT Frequency: 5x/week            Contractures Contractures Info: Not present    Additional Factors Info  Code Status, Allergies Code Status Info: Full code Allergies Info: Biofreeze (menthol (topical Analgesic))           Current Medications (11/18/2019):  This is the current hospital active medication list Current Facility-Administered Medications  Medication Dose Route Frequency Provider Last Rate Last Admin  . 0.9 %  sodium chloride infusion (Manually program via Guardrails IV Fluids)   Intravenous Once Blount, Scarlette Shorts T, NP      . 0.9 %  sodium chloride infusion  250 mL Intravenous PRN Collier Bullock, MD   Stopped at 11/15/19 2244  . acetaminophen (TYLENOL) tablet 650 mg  650 mg Oral Q6H PRN Agbata, Tochukwu, MD      . albuterol (PROVENTIL) (2.5 MG/3ML) 0.083% nebulizer solution 2.5 mg  2.5 mg Nebulization Q6H PRN Agbata, Tochukwu, MD      . aspirin chewable tablet 81 mg  81 mg Oral Daily Agbata, Tochukwu, MD   81 mg at 11/18/19 1107  . calcium carbonate (TUMS - dosed in mg elemental calcium) chewable tablet 400 mg of elemental calcium  2 tablet Oral Q8H PRN Agbata, Tochukwu, MD      . digoxin (LANOXIN) tablet 0.125 mg  0.125 mg Oral Daily Agbata, Tochukwu, MD   0.125 mg at 11/18/19 1108  . DULoxetine (CYMBALTA) DR capsule 120 mg  120 mg Oral Daily Agbata, Tochukwu, MD   120 mg at 11/18/19 1107  . fentaNYL (SUBLIMAZE) 100 MCG/2ML injection           . flecainide (TAMBOCOR) tablet  50 mg  50 mg Oral BID Agbata, Tochukwu, MD   50 mg at 11/18/19 1107  . fluticasone (FLONASE) 50 MCG/ACT nasal spray 1 spray  1 spray Each Nare Daily Agbata, Tochukwu, MD   1 spray at 11/18/19 1112  . gelatin adsorbable (GELFOAM/SURGIFOAM) 12-7 MM sponge 12-7 mm           . guaiFENesin (MUCINEX) 12 hr tablet 600 mg  600 mg Oral BID Agbata, Tochukwu, MD   600 mg at 11/18/19 1107  . HYDROmorphone (DILAUDID) injection 1 mg  1 mg Intravenous Q4H PRN Agbata, Tochukwu, MD   1 mg at 11/18/19 1315  . levothyroxine (SYNTHROID) tablet 25 mcg  25 mcg Oral QAC breakfast Agbata, Tochukwu, MD   25 mcg at 11/18/19 0635  . lidocaine (PF) (XYLOCAINE) 1 % injection           . loratadine (CLARITIN) tablet 10 mg  10 mg Oral Daily Agbata, Tochukwu, MD   10 mg at 11/18/19 1107  . midazolam (  VERSED) 2 MG/2ML injection           . multivitamin with minerals tablet 1 tablet  1 tablet Oral Daily [name], MD   1 tablet at 11/18/19 1106  . ondansetron (ZOFRAN) tablet 4 mg  4 mg Oral Q6H PRN Agbata, Tochukwu, MD       Or  . ondansetron (ZOFRAN) injection 4 mg  4 mg Intravenous Q6H PRN Agbata, Tochukwu, MD      . oxyCODONE-acetaminophen (PERCOCET) 7.5-325 MG per tablet 2 tablet  2 tablet Oral Q4H PRN Nita Sells, MD   2 tablet at 11/18/19 1106  . pantoprazole (PROTONIX) EC tablet 40 mg  40 mg Oral Daily Agbata, Tochukwu, MD   40 mg at 11/18/19 1107  . polyethylene glycol (MIRALAX / GLYCOLAX) packet 17 g  17 g Oral Daily Agbata, Tochukwu, MD   17 g at 11/17/19 1046  . polyvinyl alcohol (LIQUIFILM TEARS) 1.4 % ophthalmic solution 2 drop  2 drop Both Eyes TID Agbata, Tochukwu, MD   2 drop at 11/18/19 1111  . predniSONE (DELTASONE) tablet 5 mg  5 mg Oral Q breakfast Agbata, Tochukwu, MD   5 mg at 11/18/19 1107  . primidone (MYSOLINE) tablet 100 mg  100 mg Oral QHS Agbata, Tochukwu, MD   100 mg at 11/17/19 2009  . propranolol ER (INDERAL LA) 24 hr capsule 60 mg  60 mg Oral Daily Agbata, Tochukwu, MD   60 mg at  11/18/19 1107  . senna-docusate (Senokot-S) tablet 1 tablet  1 tablet Oral BID [name], MD   1 tablet at 11/18/19 1107  . sodium chloride flush (NS) 0.9 % injection 3 mL  3 mL Intravenous Q12H Agbata, Tochukwu, MD   3 mL at 11/18/19 1112  . sodium chloride flush (NS) 0.9 % injection 3 mL  3 mL Intravenous PRN Agbata, Tochukwu, MD      . traZODone (DESYREL) tablet 25 mg  25 mg Oral QHS Agbata, Tochukwu, MD   25 mg at 11/17/19 2009  . umeclidinium bromide (INCRUSE ELLIPTA) 62.5 MCG/INH 1 puff  1 puff Inhalation Daily Agbata, Tochukwu, MD   1 puff at 11/17/19 0748     Discharge Medications: Please see discharge summary for a list of discharge medications.  Relevant Imaging Results:  Relevant Lab Results:   Additional Information SSN:  [id number]  Bethann Berkshire, LCSW

## 2019-11-18 NOTE — Progress Notes (Signed)
PROGRESS NOTE    Dawn [name]  ZOX:[id number] DOB: 12-Mar-1940 DOA: 11/14/2019 PCP: [name], MD  Brief Narrative   79 year old female resident of [facility]  COPD on home oxygen recurrent urinary tract infections Prior left ankle fracture 2018, hip fracture 01/2016 Prior SVT on digoxin and flecainide microcytic anemia Reflux  Prior benign sigmoid stricture related to diverticulosis diagnosed 2018 HFpEF Hyperthyroidism on methimazole  Presented to emergency room 11/14/2019 with sharp abdominal pain as well as a several second lumbar spinal pain On admission, sodium 131 BUN/creatinine 23/1.4 AST ALT 30/21 hemoglobin 7.5 CXR showed COPD CT angiogram chest pelvis showed 3 cm right middle lobe mass with right hilar adenopathy extensive metastatic parents and liver Oncology consulted IR consulted for work-up--patient underwent liver biopsy on 11/8 and radiation oncology was consulted repossibility XRT  Assessment & Plan:   Principal Problem:   Mass of middle lobe of right lung Active Problems:   COPD (chronic obstructive pulmonary disease) (HCC)   CKD (chronic kidney disease) stage 3, GFR 30-59 ml/min (HCC)   Narrow complex tachycardia (HCC)   HTN (hypertension)   SVT (supraventricular tachycardia) (HCC)   Chronic diastolic CHF (congestive heart failure) (HCC)   Bone metastases (HCC)   Chronic respiratory failure with hypoxia (HCC)   Anemia in other chronic diseases classified elsewhere   1. Metastatic ca unknown primary a. Liver biopsy performed 11/18/2019 b. MRI thoracic lumbar spine shows concerns of metastases in L3-L4 spine 2.4 cm 8 mm met in S1 and METS which are very small in T8 and T9 she is not ambulating states her pain is severe-- c. Pain control changed to Percocet 7.5 every 4 as needed with the Dilaudid as backup-patient not requiring IV Dilaudid 2. Anemia of malignancy a. No overt bleeding b. Transfused on 11/5 2 units PR VC with acceptable  rise c. Give Feraheme 11/6 d. Hemoccult stool was positive for blood but no gross overt bleeding e. I discussed the case with Dr. Michail Sermon who feels we can defer any invasive type of work-up such as colonoscopy for this time and can be referred as an outpatient to them if overt bleeding f. Would hold any nonsteroidal 3. aki on admit a. Improved from admission discontinue nephrotoxins  b. Would hold prior to admission Lasix Monday Wednesday Friday 20 4. COPD on home oxygen a. Stable at this time 5. CKD 3 a a. As above 6. SVT /HFpEF  a. continue flecainide 50 twice daily, digoxin 0.125 daily, propanolol cut back to 40 twice daily b. Sinus rhythm on monitors 7. Hyperthyroid previously given methimazole now hypothyroid  a. continue Synthroid 25 mcg daily  DVT prophylaxis: None Code Status: .Full Family Communication: Discussed with the family and the daughter in detail at bedside 79 8   Disposition: Inpatient  Status is: Observation  The patient will require care spanning > 2 midnights and should be moved to inpatient because: Hemodynamically unstable, Persistent severe electrolyte disturbances and Ongoing active pain requiring inpatient pain management  Dispo: The patient is from: SNF              Anticipated d/c is to: SNF              Anticipated d/c date is: 2 days              Patient currently is not medically stable to d/c.  Consultants:   Oncology  IR  Procedures: None  Antimicrobials: None currently   Subjective:  Awake alert coherent no  distress EOMI NCAT no focal deficit She ate some of her meal although daughter complains she is not eating much She has no chest pain no fever She remains in bed  Objective: Vitals:   11/18/19 1035 11/18/19 1040 11/18/19 1045 11/18/19 1418  BP: (!) 127/50 (!) 127/57 (!) 119/59 137/65  Pulse: 60 64 62 (!) 55  Resp: _0 Temp:    (!) 97.5 F (36.4 C)  TempSrc:      SpO2: 100% 100% 100% 98%  Weight:      Height:         Intake/Output Summary (Last 24 hours) at 11/18/2019 1503 Last data filed at 11/18/2019 0500 Gross per 24 hour  Intake 240 ml  Output 200 ml  Net 40 ml   Filed Weights   11/14/19 1628  Weight: 60.7 kg    Examination:  Awake coherent Chest clear no added sound no rales rhonchi abdomen soft no rebound no guarding ROM intact S1-S2 no murmur rub or gallop abdomen soft no rebound No lower extremity edema  Data Reviewed: I have personally reviewed following labs and imaging studies  Na 134-->135-->138  K 4.5-->4.3-->4.1 Bun/creat 23/1.4--->13/1.01--> 9/0.9-->12/1.11 Hemoglobin 7.5-->6.4-->2 units PRBC--10.0-->10.2 White count 4.6   Radiology Studies: US BIOPSY (LIVER)  Result Date: 11/18/2019 INDICATION: 3 cm right lung mass with associated multiple liver masses likely consistent with metastatic disease. The patient presents for liver lesion biopsy. EXAM: ULTRASOUND GUIDED CORE BIOPSY OF LIVER MASS MEDICATIONS: None. ANESTHESIA/SEDATION: Fentanyl 25 mcg IV; Versed 1.0 mg IV Moderate Sedation Time:  15 minutes. The patient was continuously monitored during the procedure by the interventional radiology nurse under my direct supervision. PROCEDURE: The procedure, risks, benefits, and alternatives were explained to the patient. Questions regarding the procedure were encouraged and answered. The patient understands and consents to the procedure. A time-out was performed prior to initiating the procedure. Ultrasound was performed to localize liver lesions. The abdominal wall was prepped with chlorhexidine in a sterile fashion, and a sterile drape was applied covering the operative field. A sterile gown and sterile gloves were used for the procedure. Local anesthesia was provided with 1% Lidocaine. Under ultrasound guidance, a 17 gauge trocar needle was advanced to the level of a left lobe liver mass. After confirming needle tip position, 4 separate coaxial 18 gauge core biopsy samples were  obtained and submitted in formalin. Gel-Foam pledgets were advanced through the outer needle as it was retracted and removed. Additional ultrasound was performed. COMPLICATIONS: None immediate. FINDINGS: Multiple rounded masses are seen throughout the liver parenchyma. A lesion in the lateral segment of the left lobe of the liver was chosen for sampling and measures approximately 4.2 x 3.8 cm in the transverse plane. Solid tissue samples were obtained from the mass. IMPRESSION: Ultrasound-guided core biopsy performed of a mass in the left lobe of the liver measuring up to approximately 4.2 cm in diameter. Electronically Signed   By: Aletta Edouard M.D.   On: 11/18/2019 11:33     Scheduled Meds: . sodium chloride   Intravenous Once  . aspirin  81 mg Oral Daily  . digoxin  0.125 mg Oral Daily  . DULoxetine  120 mg Oral Daily  . fentaNYL      . flecainide  50 mg Oral BID  . fluticasone  1 spray Each Nare Daily  . gelatin adsorbable      . guaiFENesin  600 mg Oral BID  . levothyroxine  25 mcg Oral QAC breakfast  .  lidocaine (PF)      . loratadine  10 mg Oral Daily  . midazolam      . multivitamin with minerals  1 tablet Oral Daily  . pantoprazole  40 mg Oral Daily  . polyethylene glycol  17 g Oral Daily  . polyvinyl alcohol  2 drop Both Eyes TID  . predniSONE  5 mg Oral Q breakfast  . primidone  100 mg Oral QHS  . propranolol  20 mg Oral BID  . senna-docusate  1 tablet Oral BID  . sodium chloride flush  3 mL Intravenous Q12H  . traZODone  25 mg Oral QHS  . umeclidinium bromide  1 puff Inhalation Daily   Continuous Infusions: . sodium chloride Stopped (11/15/19 2244)     LOS: 3 days    Time spent: 25  Nita Sells, MD [facility] To contact the attending provider between 7A-7P or the covering provider during after hours 7P-7A, please log into the web site [email or web address] and access using universal [employer] password for that web site. If you do not have the  password, please call the hospital operator.  11/18/2019, 3:03 PM

## 2019-11-18 NOTE — Progress Notes (Deleted)
Radiation Oncology         ([phone]) [phone] ________________________________  Name: Dawn [name]        MRN: [id number]  Date of Service: 11/18/19 DOB: Jul 13, 1940  CC:[name], MD    REFERRING PHYSICIAN: Dr. Verlon Au  DIAGNOSIS: The primary encounter diagnosis was Iron deficiency anemia, unspecified iron deficiency anemia type. Diagnoses of Malignant neoplasm metastatic to liver [facility]) and Liver metastases ([place]) were also pertinent to this visit.   HISTORY OF PRESENT ILLNESS: Dawn [name] is a 79 y.o. female seen at the request of Dr. Verlon Au for a probable Stage IV lung cancer. The patient has multiple comorbidities and resides in a skilled facility where she requires assistance with many ADLs. She has had some modest weight loss in the last few months, though details are unknown. She presented via EMS from her facility given progressive mid back pain. Work up in the ED on 11/14/19 showed concern for a new 3 cm mass in the RML of the lung with hilar adenopathy and extensive metastatic disease in the liver. She had an MRI of the lumbar and thoracic spine and a large mass at T8 that replaces the whole vertebral body without cord compromise. She also has an 18 mm round metastatic lesion at L3, and also a 2.4 cm mass in the right L4 pedicle. She does not have cord compromise in the lumbar spine. She also had some dark stool and hemocult is positive. She has gone for her liver biopsy this morning, but we've been consulted to consider palliative radiotherapy for her back pain.  PREVIOUS RADIATION THERAPY: No   PAST MEDICAL HISTORY:  Past Medical History:  Diagnosis Date  . Anemia   . Back pain 11/14/2019  . Chronic diastolic heart failure ([place])   . Chronic kidney disease, stage 3 ([place])   . Chronic pain syndrome   . Constipation   . COPD (chronic obstructive pulmonary disease) ([place])   . Dysphagia   . Frequent falls   . Generalized muscle weakness   . GERD (gastroesophageal  reflux disease)   . Headache   . Hip pain, right   . History of anxiety   . History of depression   . Hyperglycemia   . Hypothyroid   . Insomnia   . Metabolic encephalopathy   . Metastatic disease ([place]) 11/14/2019  . Palpitation   . Polyneuropathy   . Spinal stenosis   . Spinal stenosis, cervical region   . Supraventricular tachycardia ([place])   . Thyrotoxicosis, unspecified with thyrotoxic crisis or storm   . Tremor   . Unsteadiness on feet   . UTI (urinary tract infection)        PAST SURGICAL HISTORY: Past Surgical History:  Procedure Laterality Date  . ANKLE FRACTURE SURGERY Left   . CATARACT EXTRACTION, BILATERAL    . FLEXIBLE SIGMOIDOSCOPY Left 11/18/2015   Procedure: FLEXIBLE SIGMOIDOSCOPY;  Surgeon: [name], MD;  Location: [place];  Service: Endoscopy;  Laterality: Left;  . FLEXIBLE SIGMOIDOSCOPY N/A 11/20/2015   Procedure: FLEXIBLE SIGMOIDOSCOPY;  Surgeon: [name], MD;  Location: [facility] ENDOSCOPY;  Service: Endoscopy;  Laterality: N/A;  . HIP ARTHROPLASTY Right 01/12/2016   Procedure: ARTHROPLASTY BIPOLAR HIP (HEMIARTHROPLASTY);  Surgeon: [name], MD;  Location: [facility];  Service: Orthopedics;  Laterality: Right;     FAMILY HISTORY:  Family History  Problem Relation Age of Onset  . Heart attack Mother   . Diabetes Father   . Breast cancer Sister   . Thyroid  disease Neg Hx      SOCIAL HISTORY:  reports that she quit smoking about 3 years ago. She has never used smokeless tobacco. She reports that she does not drink alcohol and does not use drugs. The patient is divorced and she lives in [facility] for about 3 years since a hip fracture. 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    ALLERGIES: Biofreeze [menthol (topical analgesic)]   MEDICATIONS:  Current Facility-Administered Medications  Medication Dose Route Frequency Provider Last Rate Last Admin  . 0.9 %  sodium chloride infusion (Manually program via Guardrails IV Fluids)    Intravenous Once Blount, Scarlette Shorts T, NP      . 0.9 %  sodium chloride infusion  250 mL Intravenous PRN Collier Bullock, MD   Stopped at 11/15/19 2244  . acetaminophen (TYLENOL) tablet 650 mg  650 mg Oral Q6H PRN Agbata, Tochukwu, MD      . albuterol (PROVENTIL) (2.5 MG/3ML) 0.083% nebulizer solution 2.5 mg  2.5 mg Nebulization Q6H PRN Agbata, Tochukwu, MD      . aspirin chewable tablet 81 mg  81 mg Oral Daily Agbata, Tochukwu, MD   81 mg at 11/17/19 1042  . calcium carbonate (TUMS - dosed in mg elemental calcium) chewable tablet 400 mg of elemental calcium  2 tablet Oral Q8H PRN Agbata, Tochukwu, MD      . digoxin (LANOXIN) tablet 0.125 mg  0.125 mg Oral Daily Agbata, Tochukwu, MD   0.125 mg at 11/15/19 0957  . DULoxetine (CYMBALTA) DR capsule 120 mg  120 mg Oral Daily Agbata, Tochukwu, MD   120 mg at 11/17/19 1040  . flecainide (TAMBOCOR) tablet 50 mg  50 mg Oral BID Agbata, Tochukwu, MD   50 mg at 11/17/19 2009  . fluticasone (FLONASE) 50 MCG/ACT nasal spray 1 spray  1 spray Each Nare Daily Agbata, Tochukwu, MD   1 spray at 11/17/19 1045  . guaiFENesin (MUCINEX) 12 hr tablet 600 mg  600 mg Oral BID Agbata, Tochukwu, MD   600 mg at 11/17/19 2009  . HYDROmorphone (DILAUDID) injection 1 mg  1 mg Intravenous Q4H PRN Agbata, Tochukwu, MD   1 mg at 11/16/19 0952  . levothyroxine (SYNTHROID) tablet 25 mcg  25 mcg Oral QAC breakfast Agbata, Tochukwu, MD   25 mcg at 11/18/19 0635  . loratadine (CLARITIN) tablet 10 mg  10 mg Oral Daily Agbata, Tochukwu, MD   10 mg at 11/17/19 1038  . multivitamin with minerals tablet 1 tablet  1 tablet Oral Daily [name], MD   1 tablet at 11/17/19 1038  . ondansetron (ZOFRAN) tablet 4 mg  4 mg Oral Q6H PRN Agbata, Tochukwu, MD       Or  . ondansetron (ZOFRAN) injection 4 mg  4 mg Intravenous Q6H PRN Agbata, Tochukwu, MD      . oxyCODONE-acetaminophen (PERCOCET) 7.5-325 MG per tablet 2 tablet  2 tablet Oral Q4H PRN Nita Sells, MD   2 tablet at 11/18/19  0344  . pantoprazole (PROTONIX) EC tablet 40 mg  40 mg Oral Daily Agbata, Tochukwu, MD   40 mg at 11/17/19 1039  . polyethylene glycol (MIRALAX / GLYCOLAX) packet 17 g  17 g Oral Daily Agbata, Tochukwu, MD   17 g at 11/17/19 1046  . polyvinyl alcohol (LIQUIFILM TEARS) 1.4 % ophthalmic solution 2 drop  2 drop Both Eyes TID Agbata, Tochukwu, MD   2 drop at 11/17/19 2009  . predniSONE (DELTASONE) tablet 5 mg  5 mg  Oral Q breakfast Agbata, Tochukwu, MD   5 mg at 11/17/19 1042  . primidone (MYSOLINE) tablet 100 mg  100 mg Oral QHS Agbata, Tochukwu, MD   100 mg at 11/17/19 2009  . propranolol ER (INDERAL LA) 24 hr capsule 60 mg  60 mg Oral Daily Agbata, Tochukwu, MD   60 mg at 11/17/19 1538  . senna-docusate (Senokot-S) tablet 1 tablet  1 tablet Oral BID [name], MD   1 tablet at 11/17/19 2009  . sodium chloride flush (NS) 0.9 % injection 3 mL  3 mL Intravenous Q12H Agbata, Tochukwu, MD   3 mL at 11/17/19 2013  . sodium chloride flush (NS) 0.9 % injection 3 mL  3 mL Intravenous PRN Agbata, Tochukwu, MD      . traZODone (DESYREL) tablet 25 mg  25 mg Oral QHS Agbata, Tochukwu, MD   25 mg at 11/17/19 2009  . umeclidinium bromide (INCRUSE ELLIPTA) 62.5 MCG/INH 1 puff  1 puff Inhalation Daily Agbata, Tochukwu, MD   1 puff at 11/17/19 0748     REVIEW OF SYSTEMS: On review of systems, the patient reports that she is having pain in her back particularly in her low back. She denies any loss of sensation, numbness or tingling or burning pain. She states her pain is also more diffuse throughout the spine. No other complaints are verbalized.     PHYSICAL EXAM:  Wt Readings from Last 3 Encounters:  11/14/19 133 lb 13.1 oz (60.7 kg)  11/05/19 133 lb (60.3 kg)  10/18/19 131 lb 9.6 oz (59.7 kg)   Temp Readings from Last 3 Encounters:  11/18/19 98.2 F (36.8 C) (Oral)  11/05/19 (!) 97.5 F (36.4 C) (Oral)  10/18/19 (!) 97.5 F (36.4 C) (Oral)   BP Readings from Last 3 Encounters:  11/18/19  122/65  11/05/19 134/68  10/18/19 128/70   Pulse Readings from Last 3 Encounters:  11/18/19 (!) 51  11/05/19 68  10/18/19 61   Pain Assessment Pain Score: Asleep/10  Unable to assess given encounter type.  ECOG = 4  0 - Asymptomatic (Fully active, able to carry on all predisease activities without restriction)  1 - Symptomatic but completely ambulatory (Restricted in physically strenuous activity but ambulatory and able to carry out work of a light or sedentary nature. For example, light housework, office work)  2 - Symptomatic, <50% in bed during the day (Ambulatory and capable of all self care but unable to carry out any work activities. Up and about more than 50% of waking hours)  3 - Symptomatic, >50% in bed, but not bedbound (Capable of only limited self-care, confined to bed or chair 50% or more of waking hours)  4 - Bedbound (Completely disabled. Cannot carry on any self-care. Totally confined to bed or chair)  5 - Death   Eustace Pen MM, Creech RH, Tormey DC, et al. [phone]). "Toxicity and response criteria of the [facility]". [place] Oncol. 5 (6): 649-55    LABORATORY DATA:  Lab Results  Component Value Date   WBC 6.9 11/18/2019   HGB 10.2 (L) 11/18/2019   HCT 33.1 (L) 11/18/2019   MCV 92.7 11/18/2019   PLT 212 11/18/2019   Lab Results  Component Value Date   NA 138 11/18/2019   K 4.1 11/18/2019   CL 94 (L) 11/18/2019   CO2 35 (H) 11/18/2019   Lab Results  Component Value Date   ALT 22 11/18/2019   AST 43 (H) 11/18/2019  ALKPHOS 140 (H) 11/18/2019   BILITOT 0.7 11/18/2019      RADIOGRAPHY: MR THORACIC SPINE WO CONTRAST  Result Date: 11/15/2019 CLINICAL DATA:  79 year old female with CTA chest abdomen and pelvis yesterday revealing right middle lobe lung mass with evidence of right hilar and hepatic metastatic disease. Staging. EXAM: MRI THORACIC SPINE WITHOUT CONTRAST TECHNIQUE: Multiplanar, multisequence MR imaging of the  thoracic spine was performed. No intravenous contrast was administered. COMPARISON:  CTA chest abdomen and pelvis 01/14/2019. FINDINGS: Limited cervical spine imaging: Extensive cervical disc and endplate degeneration. No obvious cervical vertebral metastatic disease. Thoracic spine segmentation:  Normal. Alignment: Stable thoracic kyphosis and minimal dextroconvex scoliosis from the CTA yesterday. No significant spondylolisthesis. Vertebrae: Complete replacement of the T8 vertebral body by tumor (series 38, image 10). No loss of T8 vertebral body height and this is occult by CT. There is also a subtle T1 hypointense and STIR hyperintense 6 mm area in the left T9 inferior articulating facet suspicious for small metastasis, although atypical signal associated with benign hemangioma is also possible. Benign hemangioma with typical signal noted in the T11 body. Thoracic vertebral marrow signal elsewhere is within normal limits. Grossly normal visible posterior ribs. No other marrow edema or acute osseous abnormality. Incidental degenerative thoracic endplate Schmorl's nodes. Cord: Normal. Capacious thoracic spinal canal at most levels. Conus medullaris at T12-L1. Paraspinal and other soft tissues: Stable visible thoracic and upper abdominal viscera from yesterday. No extraosseous extension of tumor at T8. The ventral epidural space is preserved at this time. Other thoracic paraspinal soft tissues remain within normal limits. Disc levels: Disc degeneration throughout the thoracic spine. Occasional small disc herniations (such as at T7-T8 on the left series 40, image 20) without degenerative spinal stenosis. Degenerative thoracic neural foraminal stenosis does occur in part due to facet arthropathy, and is severe on the left at the T9 and T10 nerve levels. IMPRESSION: 1. T8 metastasis completely replacing normal marrow vertebral body. No epidural or extraosseous extension. 2. Subtle 6 mm metastasis versus hemangioma of  bone in the left T9 inferior articulating facet. 3. No other osseous metastatic disease identified in the thoracic spine. 4. Thoracic spine degeneration concordant with age not resulting in thoracic spinal stenosis. Electronically Signed   By: Genevie Ann M.D.   On: 11/15/2019 17:32   MR LUMBAR SPINE WO CONTRAST  Result Date: 11/15/2019 CLINICAL DATA:  79 year old female with CTA chest abdomen and pelvis yesterday revealing right middle lobe lung mass with evidence of right hilar and hepatic metastatic disease. Staging. EXAM: MRI LUMBAR SPINE WITHOUT CONTRAST TECHNIQUE: Multiplanar, multisequence MR imaging of the lumbar spine was performed. No intravenous contrast was administered. COMPARISON:  CTA chest abdomen and pelvis yesterday. Thoracic MRI today reported separately. Lumbar MRI 11/22/2017. FINDINGS: Segmentation: Normal. This is concordant with thoracic numbering today, and is the same numbering system used on the 2019 MRI. Alignment: Stable lumbar lordosis and levoconvex lumbar scoliosis since the 2019 MRI. Stable mild grade 1 anterolisthesis of L5 on S1. Vertebrae: There is an 18 mm round metastasis in the anterior L3 vertebral body on series 6, image 10. No extraosseous extension. There is a moderate size 2.4 cm metastasis of the right L4 pedicle and superior articulating facet (series 6, image 5 and series 8, image 13). No extraosseous or epidural extension of tumor from this lesion (series 7, image 13). Small L4 superior endplate Schmorl's node on series 6, image 8 is stable. As is a chronic L2 superior endplate Schmorl's node on image 9.  Questionable subtle L1 vertebral body metastasis on image 11, could be artifact. No other lumbar vertebral metastasis identified. Lumbar marrow signal elsewhere is stable since 2019. A small 8 mm right S1 sacral ala metastasis is identified on series 8, image 22, and also on sagittal images, and is new since 2019. Elsewhere stable visible sacrum and medial iliac bones.  Conus medullaris and cauda equina: Conus extends to the T12-L1 level. No lower spinal cord or conus signal abnormality. Paraspinal and other soft tissues: Stable visible abdominal and pelvic viscera. Lumbar paraspinal soft tissues remain within normal limits. Disc levels: Widespread lumbar spine degeneration has not significantly changed since the 2019 MRI. IMPRESSION: 1. L3 vertebral body and right L4 pedicle metastases in the lumbar spine, the latter measuring 2.4 cm. Questionable tiny L1 vertebral body metastasis also. No extraosseous or epidural extension of tumor. 2. Small 8 mm right sacral ala metastasis at S1. 3. Underlying lumbar spine degeneration not significantly changed since 2019. Electronically Signed   By: Genevie Ann M.D.   On: 11/15/2019 17:30   DG Chest Portable 1 View  Result Date: 11/14/2019 CLINICAL DATA:  Pain EXAM: PORTABLE CHEST 1 VIEW COMPARISON:  10/04/2016 FINDINGS: Rounded density projects over the right lower lung. Mild hyperinflation/COPD. Heart is borderline in size. Left basilar linear densities, likely scarring. No effusions. No acute bony abnormality. IMPRESSION: COPD, cardiomegaly. Rounded density projects over the right lower lung. This could be further evaluated with chest CT. Electronically Signed   By: Rolm Baptise M.D.   On: 11/14/2019 03:36   CT Angio Chest/Abd/Pel for Dissection W and/or Wo Contrast  Result Date: 11/14/2019 CLINICAL DATA:  Abdominal pain with aortic dissection suspected EXAM: CT ANGIOGRAPHY CHEST, ABDOMEN AND PELVIS TECHNIQUE: Non-contrast CT of the chest was initially obtained. Multidetector CT imaging through the chest, abdomen and pelvis was performed using the standard protocol during bolus administration of intravenous contrast. Multiplanar reconstructed images and MIPs were obtained and reviewed to evaluate the vascular anatomy. CONTRAST:  43mL OMNIPAQUE IOHEXOL 350 MG/ML SOLN COMPARISON:  Abdomen and pelvis CT 01/26/2016 FINDINGS: CTA CHEST  FINDINGS Cardiovascular: No intramural hematoma on the noncontrast phase. Cardiomegaly without pericardial effusion. Large appearance of the right heart and central pulmonary arteries, suggesting pulmonary hypertension. No aortic dissection or aneurysm. Aortic and coronary atherosclerosis. Mediastinum/Nodes: Right hilar/bronchial adenopathy with 15 mm diameter node. No enlarged mediastinal nodes are seen. Negative esophagus. Lungs/Pleura: Lobulated 3 cm mass in the right middle lobe. Emphysema and scratch the emphysema. There is no edema, consolidation, effusion, or pneumothorax. Musculoskeletal: No acute or aggressive finding. Degeneration of the thoracic spine. Review of the MIP images confirms the above findings. CTA ABDOMEN AND PELVIS FINDINGS VASCULAR Aorta: Diffuse atheromatous plaque.  No dissection or aneurysm. Celiac: Plaque at the ostium without flow limiting stenosis. No branch occlusion or beading. SMA: Atheromatous plaque at the ostium. No branch occlusion or beading. Renals: Atheromatous plaque at both proximal renal arteries with at least moderate narrowing on the right. Negative for aneurysm or dissection. IMA: Small, patent vessel. Inflow: Multifocal atheromatous plaque without flow limiting stenosis, aneurysm, or dissection Veins: Negative in the arterial phase Review of the MIP images confirms the above findings. NON-VASCULAR Hepatobiliary: Hypodense masses throughout the liver, measuring up to approximately 3 cm. Negative gallbladder and biliary tree. Pancreas: Small calcifications at the pancreatic head. Negative for pancreatitis or visible mass. Spleen: Negative Adrenals/Urinary Tract: Negative adrenals. Patchy renal cortical scarring greater on the right at the upper pole. Renal cystic densities measuring up to 4.9  cm on the left. Stomach/Bowel: Negative for obstruction or visible inflammation. Colonic fluid levels reach the distal colon. There are a few areas of luminal narrowing without  discrete mass, likely from luminal collapse. Lymphatic: Negative for adenopathy. Reproductive: Hysterectomy and pelvic floor laxity Other: No ascites or pneumoperitoneum Musculoskeletal: Right hip arthroplasty. No acute or aggressive finding. Prominent lumbar spine degeneration with L5-S1 anterolisthesis and mild scoliosis. Review of the MIP images confirms the above findings. IMPRESSION: 1. 3 cm right middle lobe mass with right hilar adenopathy and extensive metastatic appearance in the liver. 2. No evidence of acute aortic syndrome. 3. Aortic Atherosclerosis (ICD10-I70.0) and Emphysema (ICD10-J43.9). Electronically Signed   By: Monte Fantasia M.D.   On: 11/14/2019 05:21       IMPRESSION/PLAN: 1. Probable Stage IV lung cancer with bony metastases. Dr. Lisbeth Renshaw has reviewed her imaging and work up to date. Dr. Lindi Adie has already discussed that systemic therapy would not be appropriate unless her disease was breast cancer. However it appears more consistent with lung cancer by imaging. We will await the results of her liver biopsy which is scheduled for today. She appears to be a good candidate for a course of palliative radiotherapy to the thoracic (T8) and lumbar (L3-4) spine given her symptoms.  If her inpt team thinks she will need to remain inpt status, we can proceed with carelink to [facility] tomorrow and begin treatment on Thursday.  We discussed the risks, benefits, short, and long term effects of radiotherapy, and the patient is interested in proceeding. Dr. Lisbeth Renshaw discusses the delivery and logistics of radiotherapy and anticipates a course of 5 fractions of radiotherapy to the thoracic and lumbar spine targets. She will consent for treatment at the time of simulation. I did review with her daughter that they would benefit from a goals of care discussion with palliative medicine and with oncology, given that she is not a candidate for chemotherapy in the setting of metastatic cancer. 2. Pain  secondary to #1. We appreciate the pain medication recommendations by her inpt team. Hopefully we will see improvement in her symptoms with the addition of radiotherapy. 3. Positive Hemocult. We will defer further work up of this to the primary team.   In a visit lasting 60 minutes, greater than 50% of the time was spent by phone and in floor time discussing the patient's condition, in preparation for the discussion, and coordinating the patient's care.     Carola Rhine, PAC

## 2019-11-18 NOTE — Procedures (Signed)
Interventional Radiology Procedure Note  Procedure: US Guided Biopsy of liver lesion  Complications: None  Estimated Blood Loss: < 10 mL  Findings: 18 G core biopsy of left lobe liver mass performed under US guidance.  Four core samples obtained and sent to Pathology.  Venetia Night. Kathlene Cote, M.D Pager:  [phone]

## 2019-11-19 ENCOUNTER — Ambulatory Visit
Admit: 2019-11-19 | Discharge: 2019-11-19 | Disposition: A | Discharge status: Home / Self Care | Payer: Medicare Other | Source: Ambulatory Visit | Attending: Radiation Oncology | Admitting: Radiation Oncology

## 2019-11-19 ENCOUNTER — Encounter: Payer: Self-pay | Admitting: Licensed Clinical Social Worker

## 2019-11-19 ENCOUNTER — Other Ambulatory Visit: Payer: Self-pay | Admitting: *Deleted

## 2019-11-19 ENCOUNTER — Ambulatory Visit: Payer: Medicare Other | Admitting: Neurology

## 2019-11-19 DIAGNOSIS — Z510 Encounter for antineoplastic radiation therapy: Secondary | ICD-10-CM | POA: Insufficient documentation

## 2019-11-19 DIAGNOSIS — R918 Other nonspecific abnormal finding of lung field: Secondary | ICD-10-CM | POA: Diagnosis not present

## 2019-11-19 DIAGNOSIS — C787 Secondary malignant neoplasm of liver and intrahepatic bile duct: Secondary | ICD-10-CM | POA: Insufficient documentation

## 2019-11-19 DIAGNOSIS — C7951 Secondary malignant neoplasm of bone: Secondary | ICD-10-CM | POA: Insufficient documentation

## 2019-11-19 DIAGNOSIS — D509 Iron deficiency anemia, unspecified: Secondary | ICD-10-CM | POA: Insufficient documentation

## 2019-11-19 DIAGNOSIS — C801 Malignant (primary) neoplasm, unspecified: Secondary | ICD-10-CM | POA: Insufficient documentation

## 2019-11-19 DIAGNOSIS — G893 Neoplasm related pain (acute) (chronic): Secondary | ICD-10-CM | POA: Insufficient documentation

## 2019-11-19 MED ORDER — PROPRANOLOL HCL 20 MG PO TABS
20.0000 mg | ORAL_TABLET | Freq: Two times a day (BID) | ORAL | Status: DC
Start: 2019-11-19 — End: 2019-12-30

## 2019-11-19 MED ORDER — TRAZODONE HCL 50 MG PO TABS: 25.0000 mg | ORAL_TABLET | Freq: Two times a day (BID) | ORAL | 0 refills | Status: DC

## 2019-11-19 MED ORDER — PRIMIDONE 50 MG PO TABS: 100.0000 mg | ORAL_TABLET | Freq: Every day | ORAL | 0 refills | Status: AC

## 2019-11-19 MED ORDER — OXYCODONE-ACETAMINOPHEN 7.5-325 MG PO TABS: 2.0000 | ORAL_TABLET | ORAL | 0 refills | Status: DC | PRN

## 2019-11-19 MED ORDER — TRAZODONE HCL 50 MG PO TABS: 25.0000 mg | ORAL_TABLET | Freq: Every day | ORAL | 0 refills | Status: DC

## 2019-11-19 MED ORDER — DULOXETINE HCL 60 MG PO CPEP: 120.0000 mg | ORAL_CAPSULE | Freq: Every day | ORAL | 0 refills | Status: AC

## 2019-11-19 NOTE — Progress Notes (Signed)
[place] Clinical Social Work  Received request from inpatient [place] to help facilitate [facility] transport to SNF [facility]) for after pt's radiation sim appt.  CSW contacted [facility] [phone]) and arranged for pick-up post-appt. [place] [phone]) and updated regarding transportation.  Provided paperwork from inpatient CSW to RN Quincy Simmonds to have with patient for her pick-up.   Christeen Douglas, LCSW

## 2019-11-19 NOTE — Discharge Summary (Signed)
Physician Discharge Summary  JENIN BIRDSALL HKV:[id number] DOB: 06-Nov-1940 DOA: 11/14/2019  PCP: [name], MD  Admit date: 11/14/2019 Discharge date: 11/19/2019  Time spent: 25 minutes  Recommendations for Outpatient Follow-up:  1. Recommend goals of care as an outpatient guided by oncology as patient has a poor prognosis 2. Recommend Chem-12 CBC in 1 week and TSH in 3 weeks as she is on thyroxine 3. Continue on oxygen 2 L at all times for severe COPD 4. Note dosage changes of her propranolol and discontinuation of Lasix this admission because of AKI 5. Recommend possible GI referral if she has overt dark stools-Hemoccult was positive with this admission but it was not felt to be the source of her anemia 6. Patient will need scheduling with XRT for radiation to her lower back for pain control please coordinate with radiation oncology 7. Patient's liver biopsy from 11/8 should be followed to determine next steps for chemoradiation although there may be no real options and patient should follow-up with Dr. 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   Discharge Condition: Fair to guarded  Diet recommendation: Low-salt  Filed Weights   11/14/19 1628  Weight: 60.7 kg    History of present illness:  79 year old female resident of [facility]  COPD on home oxygen recurrent urinary tract infections Prior left ankle fracture 2018, hip fracture 01/2016 Prior SVT on digoxin and  flecainide microcytic anemia Reflux  Prior benign sigmoid stricture related to diverticulosis diagnosed 2018 HFpEF Hyperthyroidism on methimazole  Presented to emergency room 11/14/2019 with sharp abdominal pain as well as a several second lumbar spinal pain On admission, sodium 131 BUN/creatinine 23/1.4 AST ALT 30/21 hemoglobin 7.5 CXR showed COPD CT angiogram chest pelvis showed 3 cm right middle lobe mass with right hilar adenopathy extensive metastatic parents and liver Oncology consulted IR consulted for work-up--patient underwent liver biopsy on 11/8 and radiation oncology was consulted repossibility XRT   Hospital Course:  1. Metastatic ca unknown primary a. Liver biopsy performed 11/18/2019 b. MRI thoracic lumbar spine shows concerns of metastases in L3-L4 spine 2.4 cm 8 mm met in S1 and METS which are very small in T8 and T9 she is not ambulating states her pain is severe--radiation oncology will start stimulation and consider fractions of radiation to the area to see if this helps with pain c. Pain control changed to Percocet 7.5 every 4 as needed with the Dilaudid as backup-patient not requiring IV Dilaudid-prescription of Percocet were given on discharge 2. Anemia of malignancy a. No overt bleeding b. Transfused on 11/5 2 units PR VC with acceptable rise c. Give Feraheme 11/6 d. Hemoccult stool was positive for blood but no gross overt bleeding e. I discussed the case with Dr. Michail Sermon who feels we can defer any invasive type of work-up such as colonoscopy for this time and can be referred as an outpatient to them if overt bleeding f. Would hold any nonsteroidal 3. aki on admit a. Improved from admission discontinue nephrotoxins such as Lasix b. Would  hold prior to admission Lasix Monday Wednesday Friday 20 4. COPD on home oxygen a. Stable at this time b. We will continue on home prednisone etc. and other inhalers 5. CKD 3 a a. As above 6. SVT /HFpEF  a. continue  flecainide 50 twice daily, digoxin 0.125 daily, propanolol cut back to 20 twice daily b. Sinus rhythm on monitors 7. Hyperthyroid previously given methimazole now hypothyroid  a. continue Synthroid 25 mcg daily b. Will need TSH in the outpatient setting  Procedures:  Multiple   Consultations:  Oncology  Discharge Exam: Vitals:   11/19/19 0539 11/19/19 0813  BP: (!) 145/70   Pulse:    Resp: 20   Temp: 98.7 F (37.1 C)   SpO2: 99% 99%    General: Awake coherent occasional confusion at times no distress EOMI NCAT no focal deficit remains on oxygen Cardiovascular: S1-S2 no murmur no rub no gallop Respiratory: Clinically clear no added sound Abdomen soft no rebound No lower extremity edema  Discharge Instructions   Discharge Instructions    Diet - low sodium heart healthy   Complete by: As directed    Increase activity slowly   Complete by: As directed    No wound care   Complete by: As directed      Allergies as of 11/19/2019      Reactions   Biofreeze [menthol (topical Analgesic)] Rash   By history Aspercreme does not cause a rash      Medication List    STOP taking these medications   furosemide 20 MG tablet Commonly known as: LASIX   HYDROcodone-acetaminophen 5-325 MG tablet Commonly known as: NORCO/VICODIN   propranolol ER 60 MG 24 hr capsule Commonly known as: Inderal LA Replaced by: propranolol 20 MG tablet   STERILID EX     TAKE these medications   acetaminophen 325 MG tablet Commonly known as: TYLENOL Take 650 mg by mouth every 6 (six) hours as needed.   albuterol (2.5 MG/3ML) 0.083% nebulizer solution Commonly known as: PROVENTIL Take 2.5 mg by nebulization every 6 (six) hours as needed for wheezing or shortness of breath.   Artificial Tears 1.4 % ophthalmic solution Generic drug: polyvinyl alcohol Place 2 drops into both eyes in the morning, at noon, in the evening, and at bedtime. For dry eyes   aspirin 81 MG chewable tablet Chew 81  mg by mouth daily.   calcium carbonate 500 MG chewable tablet Commonly known as: TUMS - dosed in mg elemental calcium Chew 2 tablets by mouth every 8 (eight) hours as needed for indigestion or heartburn.   digoxin 0.125 MG tablet Commonly known as: LANOXIN Take 0.125 mg by mouth daily. Hold if HR <60   DULoxetine 60 MG capsule Commonly known as: CYMBALTA Take 2 capsules (120 mg total) by mouth daily. For Depression   fexofenadine 180 MG tablet Commonly known as: ALLEGRA Take 180 mg by mouth daily.   flecainide 50 MG tablet Commonly known as: TAMBOCOR Take 50 mg by mouth 2 (two) times daily.   Flonase Allergy Relief 50 MCG/ACT nasal spray Generic drug: fluticasone Place 1 spray into both nostrils daily.   guaiFENesin 600 MG 12 hr tablet Commonly known as: MUCINEX Take 600 mg by mouth 2 (two) times daily. COPD cough   Incruse Ellipta 62.5 MCG/INH Aepb Generic drug: umeclidinium bromide Inhale 1 puff into the lungs daily. (SHAKE WELL)   levothyroxine 25 MCG tablet Commonly known as: SYNTHROID Take 25 mcg by mouth daily before breakfast.  multivitamin with minerals tablet Take 1 tablet by mouth daily.   ondansetron 4 MG tablet Commonly known as: ZOFRAN Take 4 mg by mouth every 8 (eight) hours as needed for nausea.   oxyCODONE-acetaminophen 7.5-325 MG tablet Commonly known as: PERCOCET Take 2 tablets by mouth every 4 (four) hours as needed for moderate pain.   OXYGEN Inhale 2 L into the lungs daily as needed (oxygen).   pantoprazole 40 MG tablet Commonly known as: PROTONIX Take 40 mg by mouth daily.   polyethylene glycol 17 g packet Commonly known as: MIRALAX / GLYCOLAX Take 17 g by mouth daily.   predniSONE 5 MG tablet Commonly known as: DELTASONE Take 5 mg by mouth daily with breakfast. TAKE 1 TABLET BY MOUTH DAILY (WITH BREAKFAST) NA:TFTD   primidone 50 MG tablet Commonly known as: MYSOLINE Take 2 tablets (100 mg total) by mouth at bedtime.    propranolol 20 MG tablet Commonly known as: INDERAL Take 1 tablet (20 mg total) by mouth 2 (two) times daily. Replaces: propranolol ER 60 MG 24 hr capsule   sennosides-docusate sodium 8.6-50 MG tablet Commonly known as: SENOKOT-S Take 1 tablet by mouth 2 (two) times daily.   traZODone 50 MG tablet Commonly known as: DESYREL Take 0.5 tablets (25 mg total) by mouth at bedtime. Take 1/2 tab =25 mg by mouth at bedtime What changed:   how much to take  how to take this  when to take this   traZODone 50 MG tablet Commonly known as: DESYREL Take 0.5 tablets (25 mg total) by mouth 2 (two) times daily. What changed:   how much to take  additional instructions      Allergies  Allergen Reactions  . Biofreeze [Menthol (Topical Analgesic)] Rash    By history Aspercreme does not cause a rash      The results of significant diagnostics from this hospitalization (including imaging, microbiology, ancillary and laboratory) are listed below for reference.    Significant Diagnostic Studies: MR THORACIC SPINE WO CONTRAST  Result Date: 11/15/2019 CLINICAL DATA:  79 year old female with CTA chest abdomen and pelvis yesterday revealing right middle lobe lung mass with evidence of right hilar and hepatic metastatic disease. Staging. EXAM: MRI THORACIC SPINE WITHOUT CONTRAST TECHNIQUE: Multiplanar, multisequence MR imaging of the thoracic spine was performed. No intravenous contrast was administered. COMPARISON:  CTA chest abdomen and pelvis 01/14/2019. FINDINGS: Limited cervical spine imaging: Extensive cervical disc and endplate degeneration. No obvious cervical vertebral metastatic disease. Thoracic spine segmentation:  Normal. Alignment: Stable thoracic kyphosis and minimal dextroconvex scoliosis from the CTA yesterday. No significant spondylolisthesis. Vertebrae: Complete replacement of the T8 vertebral body by tumor (series 38, image 10). No loss of T8 vertebral body height and this is  occult by CT. There is also a subtle T1 hypointense and STIR hyperintense 6 mm area in the left T9 inferior articulating facet suspicious for small metastasis, although atypical signal associated with benign hemangioma is also possible. Benign hemangioma with typical signal noted in the T11 body. Thoracic vertebral marrow signal elsewhere is within normal limits. Grossly normal visible posterior ribs. No other marrow edema or acute osseous abnormality. Incidental degenerative thoracic endplate Schmorl's nodes. Cord: Normal. Capacious thoracic spinal canal at most levels. Conus medullaris at T12-L1. Paraspinal and other soft tissues: Stable visible thoracic and upper abdominal viscera from yesterday. No extraosseous extension of tumor at T8. The ventral epidural space is preserved at this time. Other thoracic paraspinal soft tissues remain within normal limits. Disc levels: Disc degeneration  throughout the thoracic spine. Occasional small disc herniations (such as at T7-T8 on the left series 40, image 20) without degenerative spinal stenosis. Degenerative thoracic neural foraminal stenosis does occur in part due to facet arthropathy, and is severe on the left at the T9 and T10 nerve levels. IMPRESSION: 1. T8 metastasis completely replacing normal marrow vertebral body. No epidural or extraosseous extension. 2. Subtle 6 mm metastasis versus hemangioma of bone in the left T9 inferior articulating facet. 3. No other osseous metastatic disease identified in the thoracic spine. 4. Thoracic spine degeneration concordant with age not resulting in thoracic spinal stenosis. Electronically Signed   By: Genevie Ann M.D.   On: 11/15/2019 17:32   MR LUMBAR SPINE WO CONTRAST  Result Date: 11/15/2019 CLINICAL DATA:  79 year old female with CTA chest abdomen and pelvis yesterday revealing right middle lobe lung mass with evidence of right hilar and hepatic metastatic disease. Staging. EXAM: MRI LUMBAR SPINE WITHOUT CONTRAST  TECHNIQUE: Multiplanar, multisequence MR imaging of the lumbar spine was performed. No intravenous contrast was administered. COMPARISON:  CTA chest abdomen and pelvis yesterday. Thoracic MRI today reported separately. Lumbar MRI 11/22/2017. FINDINGS: Segmentation: Normal. This is concordant with thoracic numbering today, and is the same numbering system used on the 2019 MRI. Alignment: Stable lumbar lordosis and levoconvex lumbar scoliosis since the 2019 MRI. Stable mild grade 1 anterolisthesis of L5 on S1. Vertebrae: There is an 18 mm round metastasis in the anterior L3 vertebral body on series 6, image 10. No extraosseous extension. There is a moderate size 2.4 cm metastasis of the right L4 pedicle and superior articulating facet (series 6, image 5 and series 8, image 13). No extraosseous or epidural extension of tumor from this lesion (series 7, image 13). Small L4 superior endplate Schmorl's node on series 6, image 8 is stable. As is a chronic L2 superior endplate Schmorl's node on image 9. Questionable subtle L1 vertebral body metastasis on image 11, could be artifact. No other lumbar vertebral metastasis identified. Lumbar marrow signal elsewhere is stable since 2019. A small 8 mm right S1 sacral ala metastasis is identified on series 8, image 22, and also on sagittal images, and is new since 2019. Elsewhere stable visible sacrum and medial iliac bones. Conus medullaris and cauda equina: Conus extends to the T12-L1 level. No lower spinal cord or conus signal abnormality. Paraspinal and other soft tissues: Stable visible abdominal and pelvic viscera. Lumbar paraspinal soft tissues remain within normal limits. Disc levels: Widespread lumbar spine degeneration has not significantly changed since the 2019 MRI. IMPRESSION: 1. L3 vertebral body and right L4 pedicle metastases in the lumbar spine, the latter measuring 2.4 cm. Questionable tiny L1 vertebral body metastasis also. No extraosseous or epidural extension  of tumor. 2. Small 8 mm right sacral ala metastasis at S1. 3. Underlying lumbar spine degeneration not significantly changed since 2019. Electronically Signed   By: Genevie Ann M.D.   On: 11/15/2019 17:30   US BIOPSY (LIVER)  Result Date: 11/18/2019 INDICATION: 3 cm right lung mass with associated multiple liver masses likely consistent with metastatic disease. The patient presents for liver lesion biopsy. EXAM: ULTRASOUND GUIDED CORE BIOPSY OF LIVER MASS MEDICATIONS: None. ANESTHESIA/SEDATION: Fentanyl 25 mcg IV; Versed 1.0 mg IV Moderate Sedation Time:  15 minutes. The patient was continuously monitored during the procedure by the interventional radiology nurse under my direct supervision. PROCEDURE: The procedure, risks, benefits, and alternatives were explained to the patient. Questions regarding the procedure were encouraged and answered. The  patient understands and consents to the procedure. A time-out was performed prior to initiating the procedure. Ultrasound was performed to localize liver lesions. The abdominal wall was prepped with chlorhexidine in a sterile fashion, and a sterile drape was applied covering the operative field. A sterile gown and sterile gloves were used for the procedure. Local anesthesia was provided with 1% Lidocaine. Under ultrasound guidance, a 17 gauge trocar needle was advanced to the level of a left lobe liver mass. After confirming needle tip position, 4 separate coaxial 18 gauge core biopsy samples were obtained and submitted in formalin. Gel-Foam pledgets were advanced through the outer needle as it was retracted and removed. Additional ultrasound was performed. COMPLICATIONS: None immediate. FINDINGS: Multiple rounded masses are seen throughout the liver parenchyma. A lesion in the lateral segment of the left lobe of the liver was chosen for sampling and measures approximately 4.2 x 3.8 cm in the transverse plane. Solid tissue samples were obtained from the mass. IMPRESSION:  Ultrasound-guided core biopsy performed of a mass in the left lobe of the liver measuring up to approximately 4.2 cm in diameter. Electronically Signed   By: Aletta Edouard M.D.   On: 11/18/2019 11:33   DG Chest Portable 1 View  Result Date: 11/14/2019 CLINICAL DATA:  Pain EXAM: PORTABLE CHEST 1 VIEW COMPARISON:  10/04/2016 FINDINGS: Rounded density projects over the right lower lung. Mild hyperinflation/COPD. Heart is borderline in size. Left basilar linear densities, likely scarring. No effusions. No acute bony abnormality. IMPRESSION: COPD, cardiomegaly. Rounded density projects over the right lower lung. This could be further evaluated with chest CT. Electronically Signed   By: Rolm Baptise M.D.   On: 11/14/2019 03:36   CT Angio Chest/Abd/Pel for Dissection W and/or Wo Contrast  Result Date: 11/14/2019 CLINICAL DATA:  Abdominal pain with aortic dissection suspected EXAM: CT ANGIOGRAPHY CHEST, ABDOMEN AND PELVIS TECHNIQUE: Non-contrast CT of the chest was initially obtained. Multidetector CT imaging through the chest, abdomen and pelvis was performed using the standard protocol during bolus administration of intravenous contrast. Multiplanar reconstructed images and MIPs were obtained and reviewed to evaluate the vascular anatomy. CONTRAST:  41m OMNIPAQUE IOHEXOL 350 MG/ML SOLN COMPARISON:  Abdomen and pelvis CT 01/26/2016 FINDINGS: CTA CHEST FINDINGS Cardiovascular: No intramural hematoma on the noncontrast phase. Cardiomegaly without pericardial effusion. Large appearance of the right heart and central pulmonary arteries, suggesting pulmonary hypertension. No aortic dissection or aneurysm. Aortic and coronary atherosclerosis. Mediastinum/Nodes: Right hilar/bronchial adenopathy with 15 mm diameter node. No enlarged mediastinal nodes are seen. Negative esophagus. Lungs/Pleura: Lobulated 3 cm mass in the right middle lobe. Emphysema and scratch the emphysema. There is no edema, consolidation, effusion,  or pneumothorax. Musculoskeletal: No acute or aggressive finding. Degeneration of the thoracic spine. Review of the MIP images confirms the above findings. CTA ABDOMEN AND PELVIS FINDINGS VASCULAR Aorta: Diffuse atheromatous plaque.  No dissection or aneurysm. Celiac: Plaque at the ostium without flow limiting stenosis. No branch occlusion or beading. SMA: Atheromatous plaque at the ostium. No branch occlusion or beading. Renals: Atheromatous plaque at both proximal renal arteries with at least moderate narrowing on the right. Negative for aneurysm or dissection. IMA: Small, patent vessel. Inflow: Multifocal atheromatous plaque without flow limiting stenosis, aneurysm, or dissection Veins: Negative in the arterial phase Review of the MIP images confirms the above findings. NON-VASCULAR Hepatobiliary: Hypodense masses throughout the liver, measuring up to approximately 3 cm. Negative gallbladder and biliary tree. Pancreas: Small calcifications at the pancreatic head. Negative for pancreatitis or visible mass.  Spleen: Negative Adrenals/Urinary Tract: Negative adrenals. Patchy renal cortical scarring greater on the right at the upper pole. Renal cystic densities measuring up to 4.9 cm on the left. Stomach/Bowel: Negative for obstruction or visible inflammation. Colonic fluid levels reach the distal colon. There are a few areas of luminal narrowing without discrete mass, likely from luminal collapse. Lymphatic: Negative for adenopathy. Reproductive: Hysterectomy and pelvic floor laxity Other: No ascites or pneumoperitoneum Musculoskeletal: Right hip arthroplasty. No acute or aggressive finding. Prominent lumbar spine degeneration with L5-S1 anterolisthesis and mild scoliosis. Review of the MIP images confirms the above findings. IMPRESSION: 1. 3 cm right middle lobe mass with right hilar adenopathy and extensive metastatic appearance in the liver. 2. No evidence of acute aortic syndrome. 3. Aortic Atherosclerosis  (ICD10-I70.0) and Emphysema (ICD10-J43.9). Electronically Signed   By: Monte Fantasia M.D.   On: 11/14/2019 05:21    Microbiology: Recent Results (from the past 240 hour(s))  Respiratory Panel by RT PCR (Flu A&B, Covid) - Nasopharyngeal Swab     Status: None   Collection Time: 11/14/19  5:50 AM   Specimen: Nasopharyngeal Swab  Result Value Ref Range Status   SARS Coronavirus 2 by RT PCR NEGATIVE NEGATIVE Final    Comment: (NOTE) SARS-CoV-2 target nucleic acids are NOT DETECTED.  The SARS-CoV-2 RNA is generally detectable in upper respiratoy specimens during the acute phase of infection. The lowest concentration of SARS-CoV-2 viral copies this assay can detect is 131 copies/mL. A negative result does not preclude SARS-Cov-2 infection and should not be used as the sole basis for treatment or other patient management decisions. A negative result may occur with  improper specimen collection/handling, submission of specimen other than nasopharyngeal swab, presence of viral mutation(s) within the areas targeted by this assay, and inadequate number of viral copies (<131 copies/mL). A negative result must be combined with clinical observations, patient history, and epidemiological information. The expected result is Negative.  Fact Sheet for Patients:  PinkCheek.be  Fact Sheet for Healthcare Providers:  GravelBags.it  This test is no t yet approved or cleared by the [place] FDA and  has been authorized for detection and/or diagnosis of SARS-CoV-2 by FDA under an Emergency Use Authorization (EUA). This EUA will remain  in effect (meaning this test can be used) for the duration of the COVID-19 declaration under Section 564(b)(1) of the Act, 21 U.S.C. section 360bbb-3(b)(1), unless the authorization is terminated or revoked sooner.     Influenza A by PCR NEGATIVE NEGATIVE Final   Influenza B by PCR NEGATIVE NEGATIVE Final     Comment: (NOTE) The Xpert Xpress SARS-CoV-2/FLU/RSV assay is intended as an aid in  the diagnosis of influenza from Nasopharyngeal swab specimens and  should not be used as a sole basis for treatment. Nasal washings and  aspirates are unacceptable for Xpert Xpress SARS-CoV-2/FLU/RSV  testing.  Fact Sheet for Patients: PinkCheek.be  Fact Sheet for Healthcare Providers: GravelBags.it  This test is not yet approved or cleared by the [place] FDA and  has been authorized for detection and/or diagnosis of SARS-CoV-2 by  FDA under an Emergency Use Authorization (EUA). This EUA will remain  in effect (meaning this test can be used) for the duration of the  Covid-19 declaration under Section 564(b)(1) of the Act, 21  U.S.C. section 360bbb-3(b)(1), unless the authorization is  terminated or revoked. Performed at [facility], [place] [address], [place], [place] [address]      Labs: Basic Metabolic Panel: Recent Labs  Lab 11/14/19 0338 11/14/19 0338 11/14/19 0339 11/15/19 0139 11/16/19 0058 11/17/19 0639 11/18/19 0208  NA 130*   < > 131* 134* 135 135 138  K 4.6   < > 4.6 4.5 4.4 4.3 4.1  CL 91*   < > 91* 95* 95* 95* 94*  CO2 27  --   --  29 32 32 35*  GLUCOSE 102*   < > 100* 82 83 90 128*  BUN 22   < > '23 13 12 9 12  ' CREATININE 1.26*   < > 1.40* 1.01* 0.99 0.95 1.11*  CALCIUM 9.1  --   --  8.7* 8.5* 8.7* 8.8*   < > = values in this interval not displayed.   Liver Function Tests: Recent Labs  Lab 11/14/19 0338 11/16/19 0058 11/17/19 0639 11/18/19 0208  AST 30 37 37 43*  ALT '20 18 20 22  ' ALKPHOS 122 121 133* 140*  BILITOT 0.4 0.8 1.0 0.7  PROT 6.8 5.9* 5.8* 5.8*  ALBUMIN 3.6 3.0* 2.9* 2.9*   No results for input(s): LIPASE, AMYLASE in the last 168 hours. No results for input(s): AMMONIA in the last 168 hours. CBC: Recent Labs  Lab 11/14/19 0338 11/14/19 0338 11/14/19 0339 11/15/19 0139  11/16/19 0058 11/17/19 0639 11/18/19 0208  WBC 9.1  --   --  4.5 4.7 4.6 6.9  NEUTROABS 7.6  --   --   --  3.2 3.4 5.1  HGB 7.3*   < > 7.5* 6.4* 9.6* 10.0* 10.2*  HCT 24.1*   < > 22.0* 21.1* 29.5* 32.0* 33.1*  MCV 92.7  --   --  91.7 89.4 91.4 92.7  PLT 288  --   --  211 202 189 212   < > = values in this interval not displayed.   Cardiac Enzymes: No results for input(s): CKTOTAL, CKMB, CKMBINDEX, TROPONINI in the last 168 hours. BNP: BNP (last 3 results) No results for input(s): BNP in the last 8760 hours.  ProBNP (last 3 results) No results for input(s): PROBNP in the last 8760 hours.  CBG: No results for input(s): GLUCAP in the last 168 hours.     Signed:  Nita Sells MD   [facility] 11/19/2019, 8:20 AM

## 2019-11-19 NOTE — Consult Note (Addendum)
   [facility] CM Inpatient Consult   11/19/2019  Jenicka Coxe Derrington 1940-11-29 [id number]   Follow up:  Medicare NextGen ACO  Updated noted, can alert [facility] RN PAC of transition to [place], as a resident.  For questions, please contact:  Natividad Brood, RN BSN [facility] Liaison  [phone] business mobile phone Toll free office [phone]  Fax number: [phone] [email or web address]

## 2019-11-19 NOTE — Care Management Important Message (Signed)
Important Message  Patient Details  Name: Dawn [name] MRN: [id number] Date of Birth: 10-07-1940   Medicare Important Message Given:  Yes  Patient left Prior to IM delivery IM mailed to the patient home address.    Eldine Rencher 11/19/2019, 1:24 PM

## 2019-11-19 NOTE — Patient Outreach (Signed)
Member screened for potential [facility] Care Management needs as a benefit of [place] Medicare.  Ms. Backs recently readmitted to [facility] Communication sent to [place] to inquire about transition plan and if member will return to LTC.  If member remains in SNF for long term care, there will be no identifiable [facility] Care Management needs.   Marthenia Rolling, MSN-Ed, RN,BSN [place] Acute Care Coordinator [phone] [facility]) [phone]  (Toll free office)

## 2019-11-19 NOTE — TOC Transition Note (Addendum)
Transition of Care [facility]) - CM/SW Discharge Note   Patient Details  Name: Dawn [name] MRN: [id number] Date of Birth: 03-18-1940  [facility]) CM/SW Contact:  Bethann [place], LCSW Phone Number: 11/19/2019, 11:49 AM   Clinical Narrative:     Pt missed her CT Simulation appointment this morning. Appointment rescheduled for 1pm. Pt will be transported to [facility] via [place]. Upon completion of CT Simulation pt will return to [facility] via [place].   CSW is coordinating with social work at [employer] to arrange [place] transport to SNF.   CSW confirmed with Helene Kelp that pt can return today following CT Simulation. D/C summary sent. Call report to [facility] after [place] at [phone].   Final next level of care: [place] Barriers to Discharge: No Barriers Identified   Patient Goals and CMS Choice        Discharge Placement  [facility] SNF              Patient to be transferred to facility by: [facility] by [facility] and then to [facility] SNF via [place]      Discharge Plan and Services               Pt will go to [facility] for [place] via [place]. Upon completion of CT Simulation, pt will return to [facility] via [place].                      Social Determinants of Health (SDOH) Interventions     Readmission Risk Interventions No flowsheet data found.

## 2019-11-19 NOTE — Progress Notes (Signed)
Spoke with the patient's nurse Sarah to let her know that the patient is scheduled for a radiation simulation today at 1 pm.  I asked that she get transportation set up via carelink to have the patient here at 12:15 pm.  She states the patient is awaiting a bed here at [facility].  As of right now the patient does not have a bed assigned.  I informed Judson Roch that we would send the patient back to [facility] via carelink unless a bed was assigned to her once we are done.  Call back number for questions: [phone].  Gloriajean Dell. Leonie Green, BSN

## 2019-11-20 ENCOUNTER — Encounter: Payer: Self-pay | Admitting: Internal Medicine

## 2019-11-20 ENCOUNTER — Non-Acute Institutional Stay (SKILLED_NURSING_FACILITY): Payer: Medicare Other | Admitting: Adult Health

## 2019-11-20 ENCOUNTER — Encounter: Payer: Self-pay | Admitting: Adult Health

## 2019-11-20 DIAGNOSIS — D638 Anemia in other chronic diseases classified elsewhere: Secondary | ICD-10-CM | POA: Diagnosis not present

## 2019-11-20 DIAGNOSIS — D509 Iron deficiency anemia, unspecified: Secondary | ICD-10-CM | POA: Diagnosis not present

## 2019-11-20 DIAGNOSIS — C801 Malignant (primary) neoplasm, unspecified: Secondary | ICD-10-CM | POA: Diagnosis not present

## 2019-11-20 DIAGNOSIS — C7951 Secondary malignant neoplasm of bone: Secondary | ICD-10-CM | POA: Diagnosis not present

## 2019-11-20 DIAGNOSIS — C3491 Malignant neoplasm of unspecified part of right bronchus or lung: Secondary | ICD-10-CM | POA: Diagnosis not present

## 2019-11-20 DIAGNOSIS — Z510 Encounter for antineoplastic radiation therapy: Secondary | ICD-10-CM | POA: Diagnosis not present

## 2019-11-20 DIAGNOSIS — I471 Supraventricular tachycardia: Secondary | ICD-10-CM | POA: Diagnosis not present

## 2019-11-20 DIAGNOSIS — N179 Acute kidney failure, unspecified: Secondary | ICD-10-CM

## 2019-11-20 DIAGNOSIS — E032 Hypothyroidism due to medicaments and other exogenous substances: Secondary | ICD-10-CM

## 2019-11-20 DIAGNOSIS — J449 Chronic obstructive pulmonary disease, unspecified: Secondary | ICD-10-CM

## 2019-11-20 DIAGNOSIS — G893 Neoplasm related pain (acute) (chronic): Secondary | ICD-10-CM | POA: Diagnosis not present

## 2019-11-20 DIAGNOSIS — C787 Secondary malignant neoplasm of liver and intrahepatic bile duct: Secondary | ICD-10-CM | POA: Diagnosis not present

## 2019-11-20 DIAGNOSIS — R918 Other nonspecific abnormal finding of lung field: Secondary | ICD-10-CM | POA: Diagnosis not present

## 2019-11-20 LAB — SURGICAL PATHOLOGY

## 2019-11-20 NOTE — Progress Notes (Signed)
Location:  [place] Room Number: 540-G Place of Service:  SNF (31) Provider:  Durenda Age, DNP, FNP-BC  Patient Care Team: [name], MD as PCP - General (Internal Medicine) Medina-Vargas, [name], NP as Nurse Practitioner (Internal Medicine)  Extended Emergency Contact Information Primary Emergency Contact: Hatfield,Lisa Address: [address]          [place], [place] [address] Johnnette Litter of [place] Phone: [phone] Mobile Phone: [phone] Relation: Daughter  Code Status:  FULL CODE  Goals of care: Advanced Directive information Advanced Directives 11/14/2019  Does Patient Have a Medical Advance Directive? No  Type of Advance Directive -  Does patient want to make changes to medical advance directive? -  Would patient like information on creating a medical advance directive? No - Patient declined     Chief Complaint  Patient presents with   Acute Visit    Patient is seen for hospital followup, status post admission at [facility] 11/4-11/9/21 for a right middle lobe lung mass    HPI:  Dawn [name] is an 79 y.o. female seen today for hospital follow-up.  She was readmitted to [facility] on 11/19/2019 for long-term care post [facility] hospitalization 11/14/2019 to 11/19/2019 for mass of middle lobe of right lung.  She has a PMH of COPD with chronic respiratory failure on 3 L oxygen, chronic pain syndrome, chronic kidney disease is stage III, hypertension, history of spinal stenosis and hypothyroidism. She presented to ED with sharp abdominal pain and lumbar spinal pain.  Chest x-ray showed COPD.  CT angiogram chest pelvis showed 3 cm right middle lobe mass with right hilar adenopathy extensive metastatic appearance in the liver.  Oncology and IR were consulted for work-up. She had liver biopsy on 11/18/2019 and radiation oncology was consulted regarding possibility of XRT.  She was noted to have hemoglobin of 7.5, compared to  baseline 9.9, with positive Hemoccult stool.  No overt bleeding was noted she was transfused 2 units of PRBC on 11/15/2019 and was given Feraheme on 11/16/2019.  Case discussed with Dr. Michail Sermon, GI, who feels invasive type of work-up such as colonoscopy can be deferred as an outpatient in case of overt bleeding.    On 11/20/2019, liver, left lobe, biopsy result showed small cell carcinoma. She seen in her room today.  She stated that she is sleepy.  Past Medical History:  Diagnosis Date   Anemia    Back pain 11/14/2019   Chronic diastolic heart failure (HCC)    Chronic kidney disease, stage 3 (HCC)    Chronic pain syndrome    Constipation    COPD (chronic obstructive pulmonary disease) (HCC)    Dysphagia    Frequent falls    Generalized muscle weakness    GERD (gastroesophageal reflux disease)    Headache    Hip pain, right    History of anxiety    History of depression    Hyperglycemia    Hypothyroid    Insomnia    Metabolic encephalopathy    Metastatic disease ([place]) 11/14/2019   Palpitation    Polyneuropathy    Spinal stenosis    Spinal stenosis, cervical region    Supraventricular tachycardia (HCC)    Thyrotoxicosis, unspecified with thyrotoxic crisis or storm    Tremor    Unsteadiness on feet    UTI (urinary tract infection)    Past Surgical History:  Procedure Laterality Date   ANKLE FRACTURE SURGERY Left    CATARACT EXTRACTION, BILATERAL  FLEXIBLE SIGMOIDOSCOPY Left 11/18/2015   Procedure: FLEXIBLE SIGMOIDOSCOPY;  Surgeon: [name], MD;  Location: [facility];  Service: Endoscopy;  Laterality: Left;   FLEXIBLE SIGMOIDOSCOPY N/A 11/20/2015   Procedure: FLEXIBLE SIGMOIDOSCOPY;  Surgeon: [name], MD;  Location: [facility] ENDOSCOPY;  Service: Endoscopy;  Laterality: N/A;   HIP ARTHROPLASTY Right 01/12/2016   Procedure: ARTHROPLASTY BIPOLAR HIP (HEMIARTHROPLASTY);  Surgeon: [name], MD;  Location: [facility];  Service: Orthopedics;   Laterality: Right;    Allergies  Allergen Reactions   Biofreeze [Menthol (Topical Analgesic)] Rash    By history Aspercreme does not cause a rash    Outpatient Encounter Medications as of 11/20/2019  Medication Sig   acetaminophen (TYLENOL) 325 MG tablet Take 650 mg by mouth every 6 (six) hours as needed.    albuterol (PROVENTIL) (2.5 MG/3ML) 0.083% nebulizer solution Take 2.5 mg by nebulization every 6 (six) hours as needed for wheezing or shortness of breath.   aspirin 81 MG chewable tablet Chew 81 mg by mouth daily.    calcium carbonate (TUMS - DOSED IN MG ELEMENTAL CALCIUM) 500 MG chewable tablet Chew 2 tablets by mouth every 8 (eight) hours as needed for indigestion or heartburn.   digoxin (LANOXIN) 0.125 MG tablet Take 0.125 mg by mouth daily. Hold if HR <60   DULoxetine (CYMBALTA) 60 MG capsule Take 2 capsules (120 mg total) by mouth daily. For Depression   fexofenadine (ALLEGRA) 180 MG tablet Take 180 mg by mouth daily.   flecainide (TAMBOCOR) 50 MG tablet Take 50 mg by mouth 2 (two) times daily.   fluticasone (FLONASE ALLERGY RELIEF) 50 MCG/ACT nasal spray Place 1 spray into both nostrils daily.    guaiFENesin (MUCINEX) 600 MG 12 hr tablet Take 600 mg by mouth 2 (two) times daily. COPD cough   levothyroxine (SYNTHROID) 25 MCG tablet Take 25 mcg by mouth daily before breakfast.   Multiple Vitamins-Minerals (MULTIVITAMIN WITH MINERALS) tablet Take 1 tablet by mouth daily.   NON FORMULARY Med Pass 138ml po TID -to promote wt. loss and malnutrion   ondansetron (ZOFRAN) 4 MG tablet Take 4 mg by mouth every 8 (eight) hours as needed for nausea.    oxyCODONE-acetaminophen (PERCOCET) 7.5-325 MG tablet Take 2 tablets by mouth every 4 (four) hours as needed for moderate pain.   pantoprazole (PROTONIX) 40 MG tablet Take 40 mg by mouth daily.   polyethylene glycol (MIRALAX / GLYCOLAX) packet Take 17 g by mouth daily.   polyvinyl alcohol (ARTIFICIAL TEARS) 1.4 % ophthalmic  solution Place 2 drops into both eyes in the morning, at noon, in the evening, and at bedtime. For dry eyes    predniSONE (DELTASONE) 5 MG tablet Take 5 mg by mouth daily with breakfast. TAKE 1 TABLET BY MOUTH DAILY (WITH BREAKFAST) UX:LKGM   primidone (MYSOLINE) 50 MG tablet Take 2 tablets (100 mg total) by mouth at bedtime.   propranolol (INDERAL) 20 MG tablet Take 1 tablet (20 mg total) by mouth 2 (two) times daily.   sennosides-docusate sodium (SENOKOT-S) 8.6-50 MG tablet Take 1 tablet by mouth 2 (two) times daily.    traZODone (DESYREL) 50 MG tablet Take 0.5 tablets (25 mg total) by mouth at bedtime. Take 1/2 tab =25 mg by mouth at bedtime   traZODone (DESYREL) 50 MG tablet Take 0.5 tablets (25 mg total) by mouth 2 (two) times daily.   umeclidinium bromide (INCRUSE ELLIPTA) 62.5 MCG/INH AEPB Inhale 1 puff into the lungs daily. (SHAKE WELL)   [DISCONTINUED] OXYGEN Inhale 2 L  into the lungs daily as needed (oxygen).    No facility-administered encounter medications on file as of 11/20/2019.    Review of Systems  GENERAL: No change in appetite, no fatigue, no weight changes, no fever, chills or weakness MOUTH and THROAT: Denies oral discomfort, gingival pain or bleeding RESPIRATORY: no cough, SOB, DOE, wheezing, hemoptysis CARDIAC: No chest pain, edema or palpitations GI: No abdominal pain, diarrhea, constipation, heart burn, nausea or vomiting GU: Denies dysuria, frequency, hematuria, incontinence, or discharge NEUROLOGICAL: Denies dizziness, syncope, numbness, or headache PSYCHIATRIC: No report of hallucinations, insomnia, paranoia, or agitation   Immunization History  Administered Date(s) Administered   Influenza, High Dose Seasonal PF 10/04/2016   Influenza,inj,Quad PF,6+ Mos 01/13/2016   Influenza-Unspecified 11/10/2017, 10/09/2018   Moderna SARS-COVID-2 Vaccination 01/28/2019, 02/25/2019   Pneumococcal-Unspecified 10/11/2011   Tdap 01/23/2016   Pertinent   Health Maintenance Due  Topic Date Due   INFLUENZA VACCINE  04/09/2020 (Originally 08/11/2019)   PNA vac Low Risk Adult  Completed   DEXA SCAN  Discontinued   Fall Risk  11/05/2019 10/24/2018 07/19/2018 01/17/2018 11/09/2017  Falls in the past year? 1 0 0 0 No  Number falls in past yr: 0 0 0 0 -  Injury with Fall? 0 0 0 0 -  Comment - - - - -  Risk Factor Category  - - - - -  Risk for fall due to : History of fall(s);Impaired balance/gait;Impaired mobility - - - -  Follow up Falls evaluation completed;Education provided - - Falls evaluation completed -     Vitals:   11/20/19 1230  BP: (!) 97/56  Pulse: (!) 54  Resp: 18  Temp: 97.9 F (36.6 C)  TempSrc: Oral  SpO2: 97%  Weight: 133 lb (60.3 kg)  Height: 5\' 8"  (1.727 m)   Body mass index is 20.22 kg/m.  Physical Exam  GENERAL APPEARANCE: Well nourished. In no acute distress. Normal body habitus SKIN:  Skin is warm and dry.  MOUTH and THROAT: Lips are without lesions. Oral mucosa is moist and without lesions.  RESPIRATORY: Breathing is even & unlabored, BS CTAB CARDIAC: RRR, no murmur,no extra heart sounds, no edema GI: Abdomen soft, normal BS, no masses, no tenderness EXTREMITIES:  Able to move X 4 extremities NEUROLOGICAL: There is no tremor. Speech is clear. PSYCHIATRIC:  Affect and behavior are appropriate  Labs reviewed: Recent Labs    11/16/19 0058 11/17/19 0639 11/18/19 0208  NA 135 135 138  K 4.4 4.3 4.1  CL 95* 95* 94*  CO2 32 32 35*  GLUCOSE 83 90 128*  BUN 12 9 12   CREATININE 0.99 0.95 1.11*  CALCIUM 8.5* 8.7* 8.8*   Recent Labs    11/16/19 0058 11/17/19 0639 11/18/19 0208  AST 37 37 43*  ALT 18 20 22   ALKPHOS 121 133* 140*  BILITOT 0.8 1.0 0.7  PROT 5.9* 5.8* 5.8*  ALBUMIN 3.0* 2.9* 2.9*   Recent Labs    11/16/19 0058 11/17/19 0639 11/18/19 0208  WBC 4.7 4.6 6.9  NEUTROABS 3.2 3.4 5.1  HGB 9.6* 10.0* 10.2*  HCT 29.5* 32.0* 33.1*  MCV 89.4 91.4 92.7  PLT 202 189 212   Lab Results   Component Value Date   TSH 3.40 07/11/2019   Lab Results  Component Value Date   HGBA1C 5.2 12/01/2015   Lab Results  Component Value Date   CHOL 107 12/01/2015   HDL 27 (L) 12/01/2015   LDLCALC 59 12/01/2015   TRIG 107 12/01/2015  CHOLHDL 4.0 12/01/2015    Significant Diagnostic Results in last 30 days:  MR THORACIC SPINE WO CONTRAST  Result Date: 11/15/2019 CLINICAL DATA:  79 year old female with CTA chest abdomen and pelvis yesterday revealing right middle lobe lung mass with evidence of right hilar and hepatic metastatic disease. Staging. EXAM: MRI THORACIC SPINE WITHOUT CONTRAST TECHNIQUE: Multiplanar, multisequence MR imaging of the thoracic spine was performed. No intravenous contrast was administered. COMPARISON:  CTA chest abdomen and pelvis 01/14/2019. FINDINGS: Limited cervical spine imaging: Extensive cervical disc and endplate degeneration. No obvious cervical vertebral metastatic disease. Thoracic spine segmentation:  Normal. Alignment: Stable thoracic kyphosis and minimal dextroconvex scoliosis from the CTA yesterday. No significant spondylolisthesis. Vertebrae: Complete replacement of the T8 vertebral body by tumor (series 38, image 10). No loss of T8 vertebral body height and this is occult by CT. There is also a subtle T1 hypointense and STIR hyperintense 6 mm area in the left T9 inferior articulating facet suspicious for small metastasis, although atypical signal associated with benign hemangioma is also possible. Benign hemangioma with typical signal noted in the T11 body. Thoracic vertebral marrow signal elsewhere is within normal limits. Grossly normal visible posterior ribs. No other marrow edema or acute osseous abnormality. Incidental degenerative thoracic endplate Schmorl's nodes. Cord: Normal. Capacious thoracic spinal canal at most levels. Conus medullaris at T12-L1. Paraspinal and other soft tissues: Stable visible thoracic and upper abdominal viscera from  yesterday. No extraosseous extension of tumor at T8. The ventral epidural space is preserved at this time. Other thoracic paraspinal soft tissues remain within normal limits. Disc levels: Disc degeneration throughout the thoracic spine. Occasional small disc herniations (such as at T7-T8 on the left series 40, image 20) without degenerative spinal stenosis. Degenerative thoracic neural foraminal stenosis does occur in part due to facet arthropathy, and is severe on the left at the T9 and T10 nerve levels. IMPRESSION: 1. T8 metastasis completely replacing normal marrow vertebral body. No epidural or extraosseous extension. 2. Subtle 6 mm metastasis versus hemangioma of bone in the left T9 inferior articulating facet. 3. No other osseous metastatic disease identified in the thoracic spine. 4. Thoracic spine degeneration concordant with age not resulting in thoracic spinal stenosis. Electronically Signed   By: Genevie Ann M.D.   On: 11/15/2019 17:32   MR LUMBAR SPINE WO CONTRAST  Result Date: 11/15/2019 CLINICAL DATA:  79 year old female with CTA chest abdomen and pelvis yesterday revealing right middle lobe lung mass with evidence of right hilar and hepatic metastatic disease. Staging. EXAM: MRI LUMBAR SPINE WITHOUT CONTRAST TECHNIQUE: Multiplanar, multisequence MR imaging of the lumbar spine was performed. No intravenous contrast was administered. COMPARISON:  CTA chest abdomen and pelvis yesterday. Thoracic MRI today reported separately. Lumbar MRI 11/22/2017. FINDINGS: Segmentation: Normal. This is concordant with thoracic numbering today, and is the same numbering system used on the 2019 MRI. Alignment: Stable lumbar lordosis and levoconvex lumbar scoliosis since the 2019 MRI. Stable mild grade 1 anterolisthesis of L5 on S1. Vertebrae: There is an 18 mm round metastasis in the anterior L3 vertebral body on series 6, image 10. No extraosseous extension. There is a moderate size 2.4 cm metastasis of the right L4  pedicle and superior articulating facet (series 6, image 5 and series 8, image 13). No extraosseous or epidural extension of tumor from this lesion (series 7, image 13). Small L4 superior endplate Schmorl's node on series 6, image 8 is stable. As is a chronic L2 superior endplate Schmorl's node on image 9. Questionable  subtle L1 vertebral body metastasis on image 11, could be artifact. No other lumbar vertebral metastasis identified. Lumbar marrow signal elsewhere is stable since 2019. A small 8 mm right S1 sacral ala metastasis is identified on series 8, image 22, and also on sagittal images, and is new since 2019. Elsewhere stable visible sacrum and medial iliac bones. Conus medullaris and cauda equina: Conus extends to the T12-L1 level. No lower spinal cord or conus signal abnormality. Paraspinal and other soft tissues: Stable visible abdominal and pelvic viscera. Lumbar paraspinal soft tissues remain within normal limits. Disc levels: Widespread lumbar spine degeneration has not significantly changed since the 2019 MRI. IMPRESSION: 1. L3 vertebral body and right L4 pedicle metastases in the lumbar spine, the latter measuring 2.4 cm. Questionable tiny L1 vertebral body metastasis also. No extraosseous or epidural extension of tumor. 2. Small 8 mm right sacral ala metastasis at S1. 3. Underlying lumbar spine degeneration not significantly changed since 2019. Electronically Signed   By: Genevie Ann M.D.   On: 11/15/2019 17:30   US BIOPSY (LIVER)  Result Date: 11/18/2019 INDICATION: 3 cm right lung mass with associated multiple liver masses likely consistent with metastatic disease. The patient presents for liver lesion biopsy. EXAM: ULTRASOUND GUIDED CORE BIOPSY OF LIVER MASS MEDICATIONS: None. ANESTHESIA/SEDATION: Fentanyl 25 mcg IV; Versed 1.0 mg IV Moderate Sedation Time:  15 minutes. The patient was continuously monitored during the procedure by the interventional radiology nurse under my direct supervision.  PROCEDURE: The procedure, risks, benefits, and alternatives were explained to the patient. Questions regarding the procedure were encouraged and answered. The patient understands and consents to the procedure. A time-out was performed prior to initiating the procedure. Ultrasound was performed to localize liver lesions. The abdominal wall was prepped with chlorhexidine in a sterile fashion, and a sterile drape was applied covering the operative field. A sterile gown and sterile gloves were used for the procedure. Local anesthesia was provided with 1% Lidocaine. Under ultrasound guidance, a 17 gauge trocar needle was advanced to the level of a left lobe liver mass. After confirming needle tip position, 4 separate coaxial 18 gauge core biopsy samples were obtained and submitted in formalin. Gel-Foam pledgets were advanced through the outer needle as it was retracted and removed. Additional ultrasound was performed. COMPLICATIONS: None immediate. FINDINGS: Multiple rounded masses are seen throughout the liver parenchyma. A lesion in the lateral segment of the left lobe of the liver was chosen for sampling and measures approximately 4.2 x 3.8 cm in the transverse plane. Solid tissue samples were obtained from the mass. IMPRESSION: Ultrasound-guided core biopsy performed of a mass in the left lobe of the liver measuring up to approximately 4.2 cm in diameter. Electronically Signed   By: Aletta Edouard M.D.   On: 11/18/2019 11:33   DG Chest Portable 1 View  Result Date: 11/14/2019 CLINICAL DATA:  Pain EXAM: PORTABLE CHEST 1 VIEW COMPARISON:  10/04/2016 FINDINGS: Rounded density projects over the right lower lung. Mild hyperinflation/COPD. Heart is borderline in size. Left basilar linear densities, likely scarring. No effusions. No acute bony abnormality. IMPRESSION: COPD, cardiomegaly. Rounded density projects over the right lower lung. This could be further evaluated with chest CT. Electronically Signed   By: Rolm Baptise M.D.   On: 11/14/2019 03:36   CT Angio Chest/Abd/Pel for Dissection W and/or Wo Contrast  Result Date: 11/14/2019 CLINICAL DATA:  Abdominal pain with aortic dissection suspected EXAM: CT ANGIOGRAPHY CHEST, ABDOMEN AND PELVIS TECHNIQUE: Non-contrast CT of the chest  was initially obtained. Multidetector CT imaging through the chest, abdomen and pelvis was performed using the standard protocol during bolus administration of intravenous contrast. Multiplanar reconstructed images and MIPs were obtained and reviewed to evaluate the vascular anatomy. CONTRAST:  68mL OMNIPAQUE IOHEXOL 350 MG/ML SOLN COMPARISON:  Abdomen and pelvis CT 01/26/2016 FINDINGS: CTA CHEST FINDINGS Cardiovascular: No intramural hematoma on the noncontrast phase. Cardiomegaly without pericardial effusion. Large appearance of the right heart and central pulmonary arteries, suggesting pulmonary hypertension. No aortic dissection or aneurysm. Aortic and coronary atherosclerosis. Mediastinum/Nodes: Right hilar/bronchial adenopathy with 15 mm diameter node. No enlarged mediastinal nodes are seen. Negative esophagus. Lungs/Pleura: Lobulated 3 cm mass in the right middle lobe. Emphysema and scratch the emphysema. There is no edema, consolidation, effusion, or pneumothorax. Musculoskeletal: No acute or aggressive finding. Degeneration of the thoracic spine. Review of the MIP images confirms the above findings. CTA ABDOMEN AND PELVIS FINDINGS VASCULAR Aorta: Diffuse atheromatous plaque.  No dissection or aneurysm. Celiac: Plaque at the ostium without flow limiting stenosis. No branch occlusion or beading. SMA: Atheromatous plaque at the ostium. No branch occlusion or beading. Renals: Atheromatous plaque at both proximal renal arteries with at least moderate narrowing on the right. Negative for aneurysm or dissection. IMA: Small, patent vessel. Inflow: Multifocal atheromatous plaque without flow limiting stenosis, aneurysm, or dissection Veins:  Negative in the arterial phase Review of the MIP images confirms the above findings. NON-VASCULAR Hepatobiliary: Hypodense masses throughout the liver, measuring up to approximately 3 cm. Negative gallbladder and biliary tree. Pancreas: Small calcifications at the pancreatic head. Negative for pancreatitis or visible mass. Spleen: Negative Adrenals/Urinary Tract: Negative adrenals. Patchy renal cortical scarring greater on the right at the upper pole. Renal cystic densities measuring up to 4.9 cm on the left. Stomach/Bowel: Negative for obstruction or visible inflammation. Colonic fluid levels reach the distal colon. There are a few areas of luminal narrowing without discrete mass, likely from luminal collapse. Lymphatic: Negative for adenopathy. Reproductive: Hysterectomy and pelvic floor laxity Other: No ascites or pneumoperitoneum Musculoskeletal: Right hip arthroplasty. No acute or aggressive finding. Prominent lumbar spine degeneration with L5-S1 anterolisthesis and mild scoliosis. Review of the MIP images confirms the above findings. IMPRESSION: 1. 3 cm right middle lobe mass with right hilar adenopathy and extensive metastatic appearance in the liver. 2. No evidence of acute aortic syndrome. 3. Aortic Atherosclerosis (ICD10-I70.0) and Emphysema (ICD10-J43.9). Electronically Signed   By: Monte Fantasia M.D.   On: 11/14/2019 05:21    Assessment/Plan  1. 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 Anemia in other chronic diseases classified elsewhere Lab Results  Component Value Date   HGB 10.2 (L) 11/18/2019   - S/P transfusion of 2 units PRBC on 11/5 and was given Feraheme on 11/6  3. AKI (acute kidney injury) [facility]) Lab Results  Component Value Date   NA 138 11/18/2019   K 4.1 11/18/2019     CO2 35 (H) 11/18/2019   BUN 12 11/18/2019   CREATININE 1.11 (H) 11/18/2019   CALCIUM 8.8 (L) 11/18/2019   GLUCOSE 128 (H) 11/18/2019   -Lasix was discontinued  4. 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 SVT (supraventricular tachycardia) (HCC) -Rate controlled, continue digoxin and flecainide  6. Hypothyroidism due to  medication Lab Results  Component Value Date   TSH 3.40 07/11/2019   -Continue levothyroxine     Family/ staff Communication: Discussed plan of care with resident and charge nurse.  Labs/tests ordered: None  Goals of care: Long-term care  Durenda Age, DNP, MSN, FNP-BC [facility] [phone] (Monday-Friday 8:00 a.m. - 5:00 P.M.) [phone] (after hours)

## 2019-11-20 NOTE — Progress Notes (Signed)
Patient Care Team: [name], MD as PCP - General (Internal Medicine) Medina-Vargas, [name], NP as Nurse Practitioner (Internal Medicine)  DIAGNOSIS:    ICD-10-CM   1. Liver metastases ([facility])  C78.7   2. Small cell lung cancer, right middle lobe ([facility])  C34.2     SUMMARY OF ONCOLOGIC HISTORY: Oncology History  Small cell lung cancer, right middle lobe ([facility])  11/18/2019 Initial Diagnosis   Patient was admitted 11/14/2019 to 11/19/2019 with severe low back pain scans revealed bone and liver metastases.  Liver biopsy revealed small cell lung cancer.      CHIEF COMPLIANT: Follow-up of recent hospitalization, right lung mass  INTERVAL HISTORY: Dawn [name] is a 79 y.o. with above-mentioned history of right middle lobe of the lung mass with right hilar adenopathy and extensive metastatic appearance of the liver. She underwent a liver biopsy on 11/18/19 for which pathology showed small cell carcinoma. She presents to the clinic today to discuss the pathology report.   ALLERGIES:  is allergic to biofreeze [menthol (topical analgesic)].  MEDICATIONS:  Current Outpatient Medications  Medication Sig Dispense Refill  . acetaminophen (TYLENOL) 325 MG tablet Take 650 mg by mouth every 6 (six) hours as needed.     Marland Kitchen albuterol (PROVENTIL) (2.5 MG/3ML) 0.083% nebulizer solution Take 2.5 mg by nebulization every 6 (six) hours as needed for wheezing or shortness of breath.    Marland Kitchen aspirin 81 MG chewable tablet Chew 81 mg by mouth daily.     . calcium carbonate (TUMS - DOSED IN MG ELEMENTAL CALCIUM) 500 MG chewable tablet Chew 2 tablets by mouth every 8 (eight) hours as needed for indigestion or heartburn.    . digoxin (LANOXIN) 0.125 MG tablet Take 0.125 mg by mouth daily. Hold if HR <60    . DULoxetine (CYMBALTA) 60 MG capsule Take 2 capsules (120 mg total) by mouth daily. For Depression 4 capsule 0  . fexofenadine (ALLEGRA) 180 MG tablet Take 180 mg by mouth daily.    . flecainide  (TAMBOCOR) 50 MG tablet Take 50 mg by mouth 2 (two) times daily.    . fluticasone (FLONASE ALLERGY RELIEF) 50 MCG/ACT nasal spray Place 1 spray into both nostrils daily.     Marland Kitchen guaiFENesin (MUCINEX) 600 MG 12 hr tablet Take 600 mg by mouth 2 (two) times daily. COPD cough    . levothyroxine (SYNTHROID) 25 MCG tablet Take 25 mcg by mouth daily before breakfast.    . Multiple Vitamins-Minerals (MULTIVITAMIN WITH MINERALS) tablet Take 1 tablet by mouth daily.    . NON FORMULARY Med Pass 123ml po TID -to promote wt. loss and malnutrion    . ondansetron (ZOFRAN) 4 MG tablet Take 4 mg by mouth every 8 (eight) hours as needed for nausea.     Marland Kitchen oxyCODONE-acetaminophen (PERCOCET) 7.5-325 MG tablet Take 2 tablets by mouth every 4 (four) hours as needed for moderate pain. 12 tablet 0  . pantoprazole (PROTONIX) 40 MG tablet Take 40 mg by mouth daily.    . polyethylene glycol (MIRALAX / GLYCOLAX) packet Take 17 g by mouth daily.    . polyvinyl alcohol (ARTIFICIAL TEARS) 1.4 % ophthalmic solution Place 2 drops into both eyes in the morning, at noon, in the evening, and at bedtime. For dry eyes     . predniSONE (DELTASONE) 5 MG tablet Take 5 mg by mouth daily with breakfast. TAKE 1 TABLET BY MOUTH DAILY (WITH BREAKFAST) HQ:PRFF    . primidone (MYSOLINE) 50 MG tablet  Take 2 tablets (100 mg total) by mouth at bedtime. 4 tablet 0  . propranolol (INDERAL) 20 MG tablet Take 1 tablet (20 mg total) by mouth 2 (two) times daily.    . sennosides-docusate sodium (SENOKOT-S) 8.6-50 MG tablet Take 1 tablet by mouth 2 (two) times daily.     Marland Kitchen umeclidinium bromide (INCRUSE ELLIPTA) 62.5 MCG/INH AEPB Inhale 1 puff into the lungs daily. (SHAKE WELL)     No current facility-administered medications for this visit.    PHYSICAL EXAMINATION: ECOG PERFORMANCE STATUS: 3 - Symptomatic, >50% confined to bed  Vitals:   11/21/19 1403  BP: 116/61  Pulse: (!) 55  Resp: 17  Temp: (!) 97.3 F (36.3 C)  SpO2: 100%   There were no  vitals filed for this visit.f  LABORATORY DATA:  I have reviewed the data as listed CMP Latest Ref Rng & Units 11/18/2019 11/17/2019 11/16/2019  Glucose 70 - 99 mg/dL 128(H) 90 83  BUN 8 - 23 mg/dL 12 9 12   Creatinine 0.44 - 1.00 mg/dL 1.11(H) 0.95 0.99  Sodium 135 - 145 mmol/L 138 135 135  Potassium 3.5 - 5.1 mmol/L 4.1 4.3 4.4  Chloride 98 - 111 mmol/L 94(L) 95(L) 95(L)  CO2 22 - 32 mmol/L 35(H) 32 32  Calcium 8.9 - 10.3 mg/dL 8.8(L) 8.7(L) 8.5(L)  Total Protein 6.5 - 8.1 g/dL 5.8(L) 5.8(L) 5.9(L)  Total Bilirubin 0.3 - 1.2 mg/dL 0.7 1.0 0.8  Alkaline Phos 38 - 126 U/L 140(H) 133(H) 121  AST 15 - 41 U/L 43(H) 37 37  ALT 0 - 44 U/L 22 20 18     Lab Results  Component Value Date   WBC 6.9 11/18/2019   HGB 10.2 (L) 11/18/2019   HCT 33.1 (L) 11/18/2019   MCV 92.7 11/18/2019   PLT 212 11/18/2019   NEUTROABS 5.1 11/18/2019    ASSESSMENT & PLAN:  Small cell lung cancer, right middle lobe (H[facility]) RML mass Liver & spine mets (T8, ? T9 vs hemangioma,? L1, L3,L4, & S1) 11/18/2019 liver biopsy: small cell cancer (Immunochemistry + TIF-1, CD56 & synaptophysin . Negative for cytokeratin 5/6 & p40 ) Extensive comorbidities: COPD with chronic respiratory failure, stage III CKD, hypertension, spinal stenosis, hypothyroidism: Resides at skilled nursing facility  CT chest 11/14/2019: 3 cm right middle lobe mass with right hilar adenopathy and extensive metastatic disease in the liver. MRI thoracic spine 11/15/2019: T8 metastases, T9 6 mm mass versus hemangioma  Counseling: I discussed with the patient and her daughter that small cell lung cancer is unfortunately a universally fatal disease when it is metastatic extensively.  Given her age health and comorbidities, she is not going to be able to tolerate systemic chemotherapy.  I recommended hospice care for her.  Palliative radiation to the back for pain control Return to clinic on an as-needed basis.   No orders of the defined types were placed  in this encounter.  The patient has a good understanding of the overall plan. she agrees with it. she will call with any problems that may develop before the next visit here.  Total time spent: 30 mins including face to face time and time spent for planning, charting and coordination of care  Nicholas Lose, MD 11/21/2019  I, Cloyde Reams Dorshimer, am acting as scribe for Dr. Nicholas Lose.  I have reviewed the above documentation for accuracy and completeness, and I agree with the above.

## 2019-11-21 ENCOUNTER — Other Ambulatory Visit: Payer: Self-pay

## 2019-11-21 ENCOUNTER — Other Ambulatory Visit: Payer: Self-pay | Admitting: Adult Health

## 2019-11-21 ENCOUNTER — Inpatient Hospital Stay: Payer: Medicare Other | Attending: Hematology and Oncology | Admitting: Hematology and Oncology

## 2019-11-21 ENCOUNTER — Encounter: Payer: Self-pay | Admitting: *Deleted

## 2019-11-21 ENCOUNTER — Non-Acute Institutional Stay (SKILLED_NURSING_FACILITY): Payer: Medicare Other | Admitting: Internal Medicine

## 2019-11-21 ENCOUNTER — Encounter: Payer: Self-pay | Admitting: Internal Medicine

## 2019-11-21 ENCOUNTER — Ambulatory Visit
Admission: RE | Admit: 2019-11-21 | Discharge: 2019-11-21 | Disposition: A | Discharge status: Home / Self Care | Payer: Medicare Other | Source: Ambulatory Visit | Attending: Radiation Oncology | Admitting: Radiation Oncology

## 2019-11-21 DIAGNOSIS — C787 Secondary malignant neoplasm of liver and intrahepatic bile duct: Secondary | ICD-10-CM | POA: Diagnosis not present

## 2019-11-21 DIAGNOSIS — D509 Iron deficiency anemia, unspecified: Secondary | ICD-10-CM | POA: Diagnosis not present

## 2019-11-21 DIAGNOSIS — E039 Hypothyroidism, unspecified: Secondary | ICD-10-CM | POA: Insufficient documentation

## 2019-11-21 DIAGNOSIS — Z510 Encounter for antineoplastic radiation therapy: Secondary | ICD-10-CM | POA: Diagnosis not present

## 2019-11-21 DIAGNOSIS — N179 Acute kidney failure, unspecified: Secondary | ICD-10-CM | POA: Diagnosis not present

## 2019-11-21 DIAGNOSIS — J449 Chronic obstructive pulmonary disease, unspecified: Secondary | ICD-10-CM | POA: Insufficient documentation

## 2019-11-21 DIAGNOSIS — G893 Neoplasm related pain (acute) (chronic): Secondary | ICD-10-CM | POA: Diagnosis not present

## 2019-11-21 DIAGNOSIS — C7951 Secondary malignant neoplasm of bone: Secondary | ICD-10-CM | POA: Insufficient documentation

## 2019-11-21 DIAGNOSIS — I129 Hypertensive chronic kidney disease with stage 1 through stage 4 chronic kidney disease, or unspecified chronic kidney disease: Secondary | ICD-10-CM | POA: Insufficient documentation

## 2019-11-21 DIAGNOSIS — I471 Supraventricular tachycardia: Secondary | ICD-10-CM

## 2019-11-21 DIAGNOSIS — C801 Malignant (primary) neoplasm, unspecified: Secondary | ICD-10-CM | POA: Diagnosis not present

## 2019-11-21 DIAGNOSIS — N189 Chronic kidney disease, unspecified: Secondary | ICD-10-CM | POA: Diagnosis not present

## 2019-11-21 DIAGNOSIS — M4800 Spinal stenosis, site unspecified: Secondary | ICD-10-CM | POA: Insufficient documentation

## 2019-11-21 DIAGNOSIS — J439 Emphysema, unspecified: Secondary | ICD-10-CM | POA: Diagnosis not present

## 2019-11-21 DIAGNOSIS — M6281 Muscle weakness (generalized): Secondary | ICD-10-CM | POA: Diagnosis not present

## 2019-11-21 DIAGNOSIS — J9610 Chronic respiratory failure, unspecified whether with hypoxia or hypercapnia: Secondary | ICD-10-CM | POA: Insufficient documentation

## 2019-11-21 DIAGNOSIS — C342 Malignant neoplasm of middle lobe, bronchus or lung: Secondary | ICD-10-CM

## 2019-11-21 DIAGNOSIS — R918 Other nonspecific abnormal finding of lung field: Secondary | ICD-10-CM | POA: Diagnosis not present

## 2019-11-21 MED ORDER — OXYCODONE-ACETAMINOPHEN 7.5-325 MG PO TABS: 2.0000 | ORAL_TABLET | ORAL | 0 refills | Status: DC | PRN

## 2019-11-21 NOTE — Assessment & Plan Note (Addendum)
11/14/2019 EKG revealed sinus rhythm with a rate of 67.  She remains on flecainide 50 mg twice daily, digoxin 0.125 mg daily and propanolol ER 60 mg daily.  The propranolol will be changed to metoprolol because of the risk of a nonselective beta-blocker with advanced COPD.

## 2019-11-21 NOTE — Progress Notes (Signed)
RN spoke with pt coordinator at [facility] and [facility] regarding MD adivce on pt transitioning to hospice care.  Care coordinator states she will place a hospice consult and let the attending physician at the facility know there has been a change in pt condition.

## 2019-11-21 NOTE — Patient Instructions (Signed)
See assessment and plan under each diagnosis in the problem list and acutely for this visit 

## 2019-11-21 NOTE — Progress Notes (Signed)
NURSING HOME LOCATION:  [facility] ROOM NUMBER:  [id number]  CODE STATUS:  FULL CODE  PCP:  [name], MD  [place] [id number]  This is a [place] readmission within 30 days.  Interim medical record and care since last [facility] visit was updated with review of diagnostic studies and change in clinical status since last visit were documented.  HPI: She was hospitalized 11/4-11/09/2019 with severe lower back pain.  Chest film revealed a mass in the right middle lobe.  Extensive imaging revealed hepatic metastases as well as spinal mets.  Hemoglobin was 7.5;she was transfused 2 units of packed red cells on 11/5 and given Feraheme on 11/6.  FOB was positive without gross overt bleeding.  Dr. Michail Sermon, GI felt that a GI work-up should be deferred in the setting of documented lung mass.   Liver biopsy 11/8 revealed small cell lung cancer.This is in the context of advanced COPD which is oxygen dependent. Creatinine was 1.4, AST/ALT 30/21.The AKI improved with discontinuation of furosemide; this was subsequently scheduled at 20 mg Monday, Wednesday, and Friday. EKG 11/4 revealed sinus rhythm with a rate of 67.  There is a history of SVT/diastolic congestive heart failure; flecainide 50 mg twice daily, digoxin 0.125 mg daily and propranolol  were continued. Synthroid 25 mcg daily was prescribed for hypothyroidism in the setting of methimazole treated hypothyroidism. Other comorbidities include history of GERD and benign sigmoid stricture related to diverticulosis.  Review of systems: She states that she feels " terrible"; her major complaint is ongoing low back pain.  She confirmed that this started shortly before being transferred to the hospital.  She describes dyspnea with minimal exertion.  She was aware that FOB was positive but denied any melena or rectal bleeding. She questioned why she was not told she had lung cancer.  At that point I explained the  nature of the small cell cancer which is an aggressive malignancy often asymptomatic before metastases are documented. She initially started to ask me about her prognosis.  When I asked for reassurance that she wanted to discuss this frankly; she stated "I do not want to talk about it".  Constitutional: No fever, significant weight change, fatigue  Cardiovascular: No chest pain, palpitations, paroxysmal nocturnal dyspnea, claudication, edema  Respiratory: No cough, sputum production, hemoptysis,  significant snoring, apnea   Gastrointestinal: No heartburn, dysphagia, abdominal pain, nausea /vomiting, rectal bleeding, melena, change in bowels Genitourinary: No dysuria, hematuria, pyuria, incontinence, nocturia Dermatologic: No rash, pruritus, change in appearance of skin Neurologic: No dizziness, syncope, seizures, numbness, tingling Hematologic/lymphatic: No significant bruising, lymphadenopathy, abnormal bleeding  Physical exam:  Pertinent or positive findings: She appears chronically ill and somewhat cachectic.  Hair is disheveled.  She is markedly pale.  Eyebrows are absent.  She was sitting on the side of the bed supporting herself with her forearms with some tremor of the right upper extremity.  There was some use of accessory muscles for respiration.  She does wear nasal oxygen.  The 2nd heart sound is increased and slightly split.  Breath sounds are decreased and there is increased AP diameter of the chest.  There is a bandage over the right upper quadrant at the site of the liver biopsy.  Limbs are thin.  Minimal clubbing of the nailbeds is suggested.  General appearance:  no acute distress, increased work of breathing is present.   Lymphatic: No lymphadenopathy about the head, neck, axilla. Eyes: No conjunctival inflammation or  lid edema is present. There is no scleral icterus. Ears:  External ear exam shows no significant lesions or deformities.   Nose:  External nasal examination shows  no deformity or inflammation. Nasal mucosa are pink and moist without lesions, exudates Oral exam:  Lips and gums are healthy appearing. There is no oropharyngeal erythema or exudate. Neck:  No thyromegaly, masses, tenderness noted.    Heart:  No gallop, murmur, click, rub .  Lungs:  without wheezes, rhonchi, rales, rubs. Abdomen: Bowel sounds are normal. Abdomen is soft and nontender with no organomegaly, hernias, masses. GU: Deferred  Extremities:  No cyanosis, edema  Neurologic exam :Balance, Rhomberg, finger to nose testing could not be completed due to clinical state Skin: Warm & dry w/o tenting. No significant lesions or rash.  See summary under each active problem in the Problem List with associated updated therapeutic plan

## 2019-11-21 NOTE — Assessment & Plan Note (Signed)
Oncology follow-up appointment today.  Anticipate discussion of prognosis and present CODE STATUS by Dr.Gudena.  She did not wish to discuss prognosis with me at today's visit.

## 2019-11-21 NOTE — Assessment & Plan Note (Addendum)
Post transfusion H/H 10.2-33.1 She is not colonoscopy candidate in context of metastatic small cell lung cancer

## 2019-11-21 NOTE — Assessment & Plan Note (Addendum)
Initial creatinine 1.4; at discharge creatinine 1.11 with GFR of 50 indicating CKD stage III A.  Avoid nephrotoxic medications.

## 2019-11-21 NOTE — Assessment & Plan Note (Signed)
Clinically stable on present regimen despite the newly diagnosed small cell cancer of the right middle lobe.  Nonselective beta-blocker will be changed to metoprolol because of the risk of COPD exacerbation.

## 2019-11-21 NOTE — Assessment & Plan Note (Signed)
RML mass Liver & spine mets (T8, ? T9 vs hemangioma,? L1, L3,L4, & S1) 11/18/2019 liver biopsy: small cell cancer (Immunochemistry + TIF-1, CD56 & synaptophysin . Negative for cytokeratin 5/6 & p40 ) Extensive comorbidities: COPD with chronic respiratory failure, stage III CKD, hypertension, spinal stenosis, hypothyroidism: Resides at skilled nursing facility  CT chest 11/14/2019: 3 cm right middle lobe mass with right hilar adenopathy and extensive metastatic disease in the liver. MRI thoracic spine 11/15/2019: T8 metastases, T9 6 mm mass versus hemangioma  Counseling: I discussed with the patient and her daughter that small cell lung cancer is unfortunately a universally fatal disease when it is metastatic extensively.  Given her age health and comorbidities, she is not going to be able to tolerate systemic chemotherapy.  I recommended hospice care for her.  Palliative radiation to the back for pain control Return to clinic on an as-needed basis.

## 2019-11-22 ENCOUNTER — Ambulatory Visit: Payer: Medicare Other

## 2019-11-25 ENCOUNTER — Ambulatory Visit: Payer: Medicare Other

## 2019-11-25 ENCOUNTER — Telehealth: Payer: Self-pay | Admitting: Hematology and Oncology

## 2019-11-25 NOTE — Telephone Encounter (Signed)
No 11/11 los, no changes made to pt schedule

## 2019-11-26 ENCOUNTER — Ambulatory Visit: Payer: Medicare Other

## 2019-11-26 DIAGNOSIS — I10 Essential (primary) hypertension: Secondary | ICD-10-CM | POA: Diagnosis not present

## 2019-11-26 LAB — BASIC METABOLIC PANEL
BUN: 21 (ref 4–21)
CO2: 28 — AB (ref 13–22)
Chloride: 102 (ref 99–108)
Creatinine: 1 (ref 0.5–1.1)
Glucose: 128
Potassium: 4.2 (ref 3.4–5.3)
Sodium: 141 (ref 137–147)

## 2019-11-26 LAB — CBC AND DIFFERENTIAL
HCT: 31 — AB (ref 36–46)
Hemoglobin: 10.4 — AB (ref 12.0–16.0)
Neutrophils Absolute: 2.8
Platelets: 236 (ref 150–399)
WBC: 3.8

## 2019-11-26 LAB — COMPREHENSIVE METABOLIC PANEL
Albumin: 3.2 — AB (ref 3.5–5.0)
Calcium: 8.5 — AB (ref 8.7–10.7)
GFR calc Af Amer: 62.4
GFR calc non Af Amer: 53.84

## 2019-11-26 LAB — HEPATIC FUNCTION PANEL
ALT: 23 (ref 7–35)
AST: 45 — AB (ref 13–35)
Alkaline Phosphatase: 305 — AB (ref 25–125)
Bilirubin, Total: 0.2

## 2019-11-26 LAB — CBC: RBC: 3.44 — AB (ref 3.87–5.11)

## 2019-11-27 ENCOUNTER — Ambulatory Visit: Payer: Medicare Other

## 2019-11-27 ENCOUNTER — Other Ambulatory Visit: Payer: Self-pay | Admitting: *Deleted

## 2019-11-27 DIAGNOSIS — G4700 Insomnia, unspecified: Secondary | ICD-10-CM | POA: Diagnosis not present

## 2019-11-27 DIAGNOSIS — F411 Generalized anxiety disorder: Secondary | ICD-10-CM | POA: Diagnosis not present

## 2019-11-27 DIAGNOSIS — F331 Major depressive disorder, recurrent, moderate: Secondary | ICD-10-CM | POA: Diagnosis not present

## 2019-11-27 NOTE — Patient Outreach (Signed)
[facility] Post- Acute Care Coordinator follow up. Member screened for potential [facility] Care Management needs as a benefit of [place] [employer].  Verified in Patient [place] that member remains in [place] for [place]. Per chart records, member has transitioned to hospice services.   No identifiable [facility] Care Management needs.   Marthenia Rolling, MSN, RN,BSN [place] Acute Care Coordinator [phone] [facility]) [phone]  (Toll free office)

## 2019-11-28 ENCOUNTER — Ambulatory Visit: Payer: Medicare Other

## 2019-11-28 ENCOUNTER — Telehealth: Payer: Self-pay | Admitting: Radiation Oncology

## 2019-11-28 NOTE — Telephone Encounter (Signed)
I called and spoke with the patient's daughter and the patient has elected for hospice care. They have decided to forgo additional treatment and request cancellation of further radiation.    Carola Rhine, PAC

## 2019-11-29 ENCOUNTER — Ambulatory Visit: Payer: Medicare Other

## 2019-12-02 ENCOUNTER — Ambulatory Visit: Payer: Medicare Other

## 2019-12-02 ENCOUNTER — Other Ambulatory Visit: Payer: Self-pay | Admitting: Adult Health

## 2019-12-02 MED ORDER — OXYCODONE-ACETAMINOPHEN 7.5-325 MG PO TABS: 2.0000 | ORAL_TABLET | ORAL | 0 refills | Status: DC | PRN

## 2019-12-03 ENCOUNTER — Ambulatory Visit: Payer: Medicare Other

## 2019-12-13 DIAGNOSIS — J069 Acute upper respiratory infection, unspecified: Secondary | ICD-10-CM | POA: Diagnosis not present

## 2019-12-13 DIAGNOSIS — Z20818 Contact with and (suspected) exposure to other bacterial communicable diseases: Secondary | ICD-10-CM | POA: Diagnosis not present

## 2019-12-14 ENCOUNTER — Other Ambulatory Visit: Payer: Self-pay | Admitting: Adult Health

## 2019-12-14 MED ORDER — OXYCODONE-ACETAMINOPHEN 7.5-325 MG PO TABS: 2.0000 | ORAL_TABLET | ORAL | 0 refills | Status: DC | PRN

## 2019-12-18 ENCOUNTER — Encounter: Payer: Self-pay | Admitting: Adult Health

## 2019-12-18 NOTE — Progress Notes (Signed)
This encounter was created in error - please disregard.

## 2019-12-25 ENCOUNTER — Other Ambulatory Visit: Payer: Self-pay | Admitting: Adult Health

## 2019-12-25 MED ORDER — OXYCODONE-ACETAMINOPHEN 7.5-325 MG PO TABS: 2.0000 | ORAL_TABLET | ORAL | 0 refills | Status: AC | PRN

## 2019-12-30 ENCOUNTER — Encounter: Payer: Self-pay | Admitting: Adult Health

## 2019-12-30 ENCOUNTER — Non-Acute Institutional Stay (SKILLED_NURSING_FACILITY): Payer: Medicare Other | Admitting: Adult Health

## 2019-12-30 DIAGNOSIS — I471 Supraventricular tachycardia: Secondary | ICD-10-CM | POA: Diagnosis not present

## 2019-12-30 DIAGNOSIS — I959 Hypotension, unspecified: Secondary | ICD-10-CM

## 2019-12-30 DIAGNOSIS — R630 Anorexia: Secondary | ICD-10-CM | POA: Diagnosis not present

## 2019-12-30 DIAGNOSIS — C342 Malignant neoplasm of middle lobe, bronchus or lung: Secondary | ICD-10-CM

## 2019-12-30 DIAGNOSIS — J449 Chronic obstructive pulmonary disease, unspecified: Secondary | ICD-10-CM | POA: Diagnosis not present

## 2019-12-30 NOTE — Progress Notes (Signed)
Location:  [place] Room Number: 546-F Place of Service:  SNF (31) Provider:  Durenda Age, DNP, FNP-BC  Patient Care Team: [name], MD as PCP - General (Internal Medicine) Medina-Vargas, [name], NP as Nurse Practitioner (Internal Medicine)  Extended Emergency Contact Information Primary Emergency Contact: Hatfield,Lisa Address: [address]          [place], [place] [address] Johnnette Litter of [place] Phone: [phone] Mobile Phone: [phone] Relation: Daughter  Code Status:  FULL CODE  Goals of care: Advanced Directive information Advanced Directives 11/14/2019  Does Patient Have a Medical Advance Directive? No  Type of Advance Directive -  Does patient want to make changes to medical advance directive? -  Would patient like information on creating a medical advance directive? No - Patient declined     Chief Complaint  Patient presents with   Medical Management of Chronic Issues    Routine [facility] visit    HPI:  Pt is an 79 y.o. female seen today for medical management of chronic diseases.  She is a long-term care resident of [facility].  She is followed by hospice.  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 SBPs ranging from 89 to 114 with outlier 153. She takes Metoprolol Succinate ER 25 mg BID for SVT. HRs ranging from 58 to 89. She stated that she has poor appetite and would like to take Ensure instead of Medpass. Latest weight is 131.2 lbs. She continues to use O2 @ 2L/min via [place]. No wheezing noted. She take Prednisone 5 mg daily,Incruise Ellipta INH 62.5 mcg inhale 1 puff daily and PRN Albuterol for COPD.   Past Medical History:  Diagnosis Date   Anemia    Back pain 11/14/2019   Chronic diastolic heart failure (HCC)    Chronic kidney  disease, stage 3 (HCC)    Chronic pain syndrome    Constipation    COPD (chronic obstructive pulmonary disease) (HCC)    Dysphagia    Frequent falls    Generalized muscle weakness    GERD (gastroesophageal reflux disease)    Headache    Hip pain, right    History of anxiety    History of depression    Hyperglycemia    Hypothyroid    Insomnia    Metabolic encephalopathy    Metastatic disease ([place]) 11/14/2019   Palpitation    Polyneuropathy    Spinal stenosis    Spinal stenosis, cervical region    Supraventricular tachycardia ([place])    Thyrotoxicosis, unspecified with thyrotoxic crisis or storm    Tremor    Unsteadiness on feet    UTI (urinary tract infection)    Past Surgical History:  Procedure Laterality Date   ANKLE FRACTURE SURGERY Left    CATARACT EXTRACTION, BILATERAL     FLEXIBLE SIGMOIDOSCOPY Left 11/18/2015   Procedure: FLEXIBLE SIGMOIDOSCOPY;  Surgeon: [name], MD;  [facility] END[facility];  Service: Endoscopy;  Laterality: Left;   FLEXIBLE SIGMOIDOSCOPY N/A 11/20/2015   Procedure: FLEXIBLE SIGMOIDOSCOPY;  Surgeon: [name], MD;  Location: [facility] ENDOSCOPY;  Service: Endoscopy;  Laterality: N/A;   HIP ARTHROPLASTY Right 01/12/2016   Procedure: ARTHROPLASTY BIPOLAR HIP (HEMIARTHROPLASTY);  Surgeon: [name], MD;  Location: [facility] ORS;  Service: Orthopedics;  Laterality: Right;    Allergies  Allergen Reactions   Biofreeze [Menthol (Topical Analgesic)] Rash  By history Aspercreme does not cause a rash    Outpatient Encounter Medications as of 12/30/2019  Medication Sig   albuterol (PROVENTIL) (2.5 MG/3ML) 0.083% nebulizer solution Take 2.5 mg by nebulization every 6 (six) hours as needed for wheezing or shortness of breath.   aspirin 81 MG chewable tablet Chew 81 mg by mouth daily.    calcium carbonate (TUMS - DOSED IN MG ELEMENTAL CALCIUM) 500 MG chewable tablet Chew 2 tablets by mouth every 8 (eight) hours as needed for  indigestion or heartburn.   digoxin (LANOXIN) 0.125 MG tablet Take 0.125 mg by mouth daily. Hold if HR <60   DULoxetine (CYMBALTA) 60 MG capsule Take 2 capsules (120 mg total) by mouth daily. For Depression   fexofenadine (ALLEGRA) 180 MG tablet Take 180 mg by mouth daily.   flecainide (TAMBOCOR) 50 MG tablet Take 50 mg by mouth 2 (two) times daily.   fluticasone (FLONASE) 50 MCG/ACT nasal spray Place 1 spray into both nostrils daily.    guaiFENesin (MUCINEX) 600 MG 12 hr tablet Take 600 mg by mouth 2 (two) times daily. COPD cough   levothyroxine (SYNTHROID) 25 MCG tablet Take 25 mcg by mouth daily before breakfast.   metoprolol succinate (TOPROL-XL) 25 MG 24 hr tablet Take 25 mg by mouth 2 (two) times daily.   Multiple Vitamins-Minerals (MULTIVITAMIN WITH MINERALS) tablet Take 1 tablet by mouth daily.   NON FORMULARY Med Pass 166ml po TID -to promote wt. loss and malnutrion   ondansetron (ZOFRAN) 4 MG tablet Take 4 mg by mouth every 8 (eight) hours as needed for nausea.    oxyCODONE-acetaminophen (PERCOCET) 7.5-325 MG tablet Take 2 tablets by mouth every 4 (four) hours as needed for moderate pain.   pantoprazole (PROTONIX) 40 MG tablet Take 40 mg by mouth daily.   polyethylene glycol (MIRALAX / GLYCOLAX) packet Take 17 g by mouth daily.   polyvinyl alcohol (LIQUIFILM TEARS) 1.4 % ophthalmic solution Place 2 drops into both eyes in the morning, at noon, in the evening, and at bedtime. For dry eyes   predniSONE (DELTASONE) 5 MG tablet Take 5 mg by mouth daily with breakfast. TAKE 1 TABLET BY MOUTH DAILY (WITH BREAKFAST) WR:UEAV   primidone (MYSOLINE) 50 MG tablet Take 2 tablets (100 mg total) by mouth at bedtime.   sennosides-docusate sodium (SENOKOT-S) 8.6-50 MG tablet Take 1 tablet by mouth 2 (two) times daily.    umeclidinium bromide (INCRUSE ELLIPTA) 62.5 MCG/INH AEPB Inhale 1 puff into the lungs daily. (SHAKE WELL)   [DISCONTINUED] propranolol (INDERAL) 20 MG tablet Take 1  tablet (20 mg total) by mouth 2 (two) times daily.   No facility-administered encounter medications on file as of 12/30/2019.    Review of Systems  GENERAL: No appetite, no fever or chills  MOUTH and THROAT: Denies oral discomfort, gingival pain or bleeding RESPIRATORY: no cough, SOB, DOE, wheezing, hemoptysis CARDIAC: No chest pain, edema or palpitations GI: No abdominal pain, diarrhea, constipation, heart burn, nausea or vomiting GU: Denies dysuria, frequency, hematuria or discharge NEUROLOGICAL: Denies dizziness, syncope, numbness, or headache PSYCHIATRIC: Denies feelings of depression or anxiety. No report of hallucinations, insomnia, paranoia, or agitation   Immunization History  Administered Date(s) Administered   Influenza, High Dose Seasonal PF 10/04/2016   Influenza,inj,Quad PF,6+ Mos 01/13/2016   Influenza-Unspecified 11/10/2017, 10/09/2018, 10/31/2019   Moderna Sars-Covid-2 Vaccination 01/28/2019, 02/25/2019   Pneumococcal-Unspecified 10/11/2011   Tdap 01/23/2016   Pertinent  Health Maintenance Due  Topic Date Due   INFLUENZA VACCINE  Completed   PNA vac Low Risk Adult  Completed   DEXA SCAN  Discontinued   Fall Risk  11/05/2019 10/24/2018 07/19/2018 01/17/2018 11/09/2017  Falls in the past year? 1 0 0 0 No  Number falls in past yr: 0 0 0 0 -  Injury with Fall? 0 0 0 0 -  Comment - - - - -  Risk Factor Category  - - - - -  Risk for fall due to : History of fall(s);Impaired balance/gait;Impaired mobility - - - -  Follow up Falls evaluation completed;Education provided - - Falls evaluation completed -     Vitals:   12/30/19 1151 12/30/19 1454  BP: (!) 153/63 (!) 89/60  Pulse: 84   Resp: 18   Temp: 97.6 F (36.4 C)   TempSrc: Oral   Weight: 133 lb (60.3 kg)   Height: 5\' 8"  (1.727 m)    Body mass index is 20.22 kg/m.  Physical Exam  GENERAL APPEARANCE: Well nourished. In no acute distress. Normal body habitus SKIN:  Skin is warm and dry.  MOUTH  and THROAT: Lips are without lesions. Oral mucosa is moist and without lesions. Tongue is normal in shape, size, and color and without lesions RESPIRATORY: Breathing is even & unlabored, BS CTAB CARDIAC:  no murmur,no extra heart sounds, no edema GI: Abdomen soft, normal BS, no masses, no tenderness NEUROLOGICAL:  Speech is clear. PSYCHIATRIC:  Affect and behavior are appropriate  Labs reviewed: Recent Labs    11/16/19 0058 11/17/19 0639 11/18/19 0208  NA 135 135 138  K 4.4 4.3 4.1  CL 95* 95* 94*  CO2 32 32 35*  GLUCOSE 83 90 128*  BUN 12 9 12   CREATININE 0.99 0.95 1.11*  CALCIUM 8.5* 8.7* 8.8*   Recent Labs    11/16/19 0058 11/17/19 0639 11/18/19 0208  AST 37 37 43*  ALT 18 20 22   ALKPHOS 121 133* 140*  BILITOT 0.8 1.0 0.7  PROT 5.9* 5.8* 5.8*  ALBUMIN 3.0* 2.9* 2.9*   Recent Labs    11/16/19 0058 11/17/19 0639 11/18/19 0208  WBC 4.7 4.6 6.9  NEUTROABS 3.2 3.4 5.1  HGB 9.6* 10.0* 10.2*  HCT 29.5* 32.0* 33.1*  MCV 89.4 91.4 92.7  PLT 202 189 212   Lab Results  Component Value Date   TSH 3.40 07/11/2019   Lab Results  Component Value Date   HGBA1C 5.2 12/01/2015   Lab Results  Component Value Date   CHOL 107 12/01/2015   HDL 27 (L) 12/01/2015   LDLCALC 59 12/01/2015   TRIG 107 12/01/2015   CHOLHDL 4.0 12/01/2015    Assessment/Plan  1. Hypotension, unspecified hypotension type BPs low, will decrease Metoprolol succinate ER from BID to daily -  Will monitor BPs  2. Small cell lung cancer, right middle lobe (HCC)  -  Followed by hospice -   Continue PRN Percocet  3. SVT (supraventricular tachycardia) (HCC)  -   HRs ranging from 58 to 89 -    Will decrease Metoprolol succinate ER from 25 mg BID to 25 mg daily -     Continue Flecainide and Digoxin  4. Poor appetite -   Will start on Ensure 237 ml TID  And discontinue Medpass per resident's preference  5. Mixed type COPD -  No wheezing, continue Incruise, PRN Albuterol and  Prednisone    Family/ staff Communication:  Discussed plan of care with resident and charge nurse.  Labs/tests ordered:  None  Goals of care:  Long-term care/hospice  Durenda Age, DNP, MSN, FNP-BC [facility] [phone] (Monday-Friday 8:00 a.m. - 5:00 p.m.) [phone] (after hours)

## 2020-01-01 ENCOUNTER — Encounter: Payer: Self-pay | Admitting: Adult Health

## 2020-01-01 ENCOUNTER — Non-Acute Institutional Stay (SKILLED_NURSING_FACILITY): Payer: Medicare Other | Admitting: Adult Health

## 2020-01-01 DIAGNOSIS — I471 Supraventricular tachycardia: Secondary | ICD-10-CM | POA: Diagnosis not present

## 2020-01-01 DIAGNOSIS — R630 Anorexia: Secondary | ICD-10-CM

## 2020-01-01 DIAGNOSIS — J449 Chronic obstructive pulmonary disease, unspecified: Secondary | ICD-10-CM | POA: Diagnosis not present

## 2020-01-01 DIAGNOSIS — Z7189 Other specified counseling: Secondary | ICD-10-CM

## 2020-01-01 DIAGNOSIS — G4700 Insomnia, unspecified: Secondary | ICD-10-CM | POA: Diagnosis not present

## 2020-01-01 DIAGNOSIS — C342 Malignant neoplasm of middle lobe, bronchus or lung: Secondary | ICD-10-CM

## 2020-01-01 DIAGNOSIS — F331 Major depressive disorder, recurrent, moderate: Secondary | ICD-10-CM | POA: Diagnosis not present

## 2020-01-01 DIAGNOSIS — E032 Hypothyroidism due to medicaments and other exogenous substances: Secondary | ICD-10-CM

## 2020-01-01 DIAGNOSIS — F411 Generalized anxiety disorder: Secondary | ICD-10-CM | POA: Diagnosis not present

## 2020-01-01 NOTE — Progress Notes (Signed)
Location:  [place] Room Number: 295-A Place of Service:  SNF (31) Provider:  Durenda Age, DNP, FNP-BC  Patient Care Team: [name], MD as PCP - General (Internal Medicine) Medina-Vargas, [name], NP as Nurse Practitioner (Internal Medicine)  Extended Emergency Contact Information Primary Emergency Contact: Hatfield,Lisa Address: [address]          [place], [place] [address] Johnnette Litter of [place] Phone: [phone] Mobile Phone: [phone] Relation: Daughter  Code Status:  FULL CODE  Goals of care: Advanced Directive information Advanced Directives 11/14/2019  Does Patient Have a Medical Advance Directive? No  Type of Advance Directive -  Does patient want to make changes to medical advance directive? -  Would patient like information on creating a medical advance directive? No - Patient declined     Chief Complaint  Patient presents with  . Advanced Directive    Patient is seen for a Care Plan Meeting    HPI:  Pt is an 79 y.o. female seen today for a care plan meeting. She is a long-term care resident of [facility]. She has a PMH of COPD with chronic respiratory failure, chronic pain syndrome, history of spinal stenosis and hypothyroidism. Resident was invited to the meeting but declined. Daughter was called but did not answer her telephone. The meeting was attended by [place] coordinator, Activity director, social worker, NP and dietician. She remains to be full code and currently on hospice.Discussed medications, vital signs and weights. She has refused to be weighed since November. Staff reported that she has poor appetite. She recently requested Ensure TID instead of medpass. BP today 120/62. Metoprolol succinate ER 25 mg was decreased from BID to daily.  The meeting lasted for 25 minutes.   Past Medical History:  Diagnosis Date  . Anemia   . Back pain 11/14/2019  . Chronic diastolic heart  failure ([place])   . Chronic kidney disease, stage 3 ([place])   . Chronic pain syndrome   . Constipation   . COPD (chronic obstructive pulmonary disease) ([place])   . Dysphagia   . Frequent falls   . Generalized muscle weakness   . GERD (gastroesophageal reflux disease)   . Headache   . Hip pain, right   . History of anxiety   . History of depression   . Hyperglycemia   . Hypothyroid   . Insomnia   . Metabolic encephalopathy   . Metastatic disease ([place]) 11/14/2019  . Palpitation   . Polyneuropathy   . Spinal stenosis   . Spinal stenosis, cervical region   . Supraventricular tachycardia ([place])   . Thyrotoxicosis, unspecified with thyrotoxic crisis or storm   . Tremor   . Unsteadiness on feet   . UTI (urinary tract infection)    Past Surgical History:  Procedure Laterality Date  . ANKLE FRACTURE SURGERY Left   . CATARACT EXTRACTION, BILATERAL    . FLEXIBLE SIGMOIDOSCOPY Left 11/18/2015   Procedure: FLEXIBLE SIGMOIDOSCOPY;  Surgeon: [name], MD;  Location: [place];  Service: Endoscopy;  Laterality: Left;  . FLEXIBLE SIGMOIDOSCOPY N/A 11/20/2015   Procedure: FLEXIBLE SIGMOIDOSCOPY;  Surgeon: [name], MD;  Location: [facility] ENDOSCOPY;  Service: Endoscopy;  Laterality: N/A;  . HIP ARTHROPLASTY Right 01/12/2016   Procedure: ARTHROPLASTY BIPOLAR HIP (HEMIARTHROPLASTY);  Surgeon: [name], MD;  [facility];  Service: Orthopedics;  Laterality: Right;    Allergies  Allergen Reactions  . Biofreeze [Menthol (Topical Analgesic)] Rash    By history Aspercreme  does not cause a rash    Outpatient Encounter Medications as of 01/01/2020  Medication Sig  . albuterol (PROVENTIL) (2.5 MG/3ML) 0.083% nebulizer solution Take 2.5 mg by nebulization every 6 (six) hours as needed for wheezing or shortness of breath.  Marland Kitchen aspirin 81 MG chewable tablet Chew 81 mg by mouth daily.   . calcium carbonate (TUMS - DOSED IN MG ELEMENTAL CALCIUM) 500 MG chewable tablet Chew 2 tablets by mouth every 8  (eight) hours as needed for indigestion or heartburn.  . digoxin (LANOXIN) 0.125 MG tablet Take 0.125 mg by mouth daily. Hold if HR <60  . DULoxetine (CYMBALTA) 60 MG capsule Take 2 capsules (120 mg total) by mouth daily. For Depression  . Ensure (ENSURE) Take 237 mLs by mouth in the morning, at noon, and at bedtime.  . fexofenadine (ALLEGRA) 180 MG tablet Take 180 mg by mouth daily.  . flecainide (TAMBOCOR) 50 MG tablet Take 50 mg by mouth 2 (two) times daily.  . fluticasone (FLONASE) 50 MCG/ACT nasal spray Place 1 spray into both nostrils daily.   Marland Kitchen guaiFENesin (MUCINEX) 600 MG 12 hr tablet Take 600 mg by mouth 2 (two) times daily. COPD cough  . levothyroxine (SYNTHROID) 25 MCG tablet Take 25 mcg by mouth daily before breakfast.  . metoprolol succinate (TOPROL-XL) 25 MG 24 hr tablet Take 25 mg by mouth daily.  . Multiple Vitamins-Minerals (MULTIVITAMIN WITH MINERALS) tablet Take 1 tablet by mouth daily.  . NON FORMULARY Med Pass 170ml po TID -to promote wt. loss and malnutrion  . ondansetron (ZOFRAN) 4 MG tablet Take 4 mg by mouth every 8 (eight) hours as needed for nausea.   Marland Kitchen oxyCODONE-acetaminophen (PERCOCET) 7.5-325 MG tablet Take 2 tablets by mouth every 4 (four) hours as needed for moderate pain.  . pantoprazole (PROTONIX) 40 MG tablet Take 40 mg by mouth daily.  . polyethylene glycol (MIRALAX / GLYCOLAX) packet Take 17 g by mouth daily.  . polyvinyl alcohol (LIQUIFILM TEARS) 1.4 % ophthalmic solution Place 2 drops into both eyes 3 (three) times daily. For dry eyes  . predniSONE (DELTASONE) 5 MG tablet Take 5 mg by mouth daily with breakfast. TAKE 1 TABLET BY MOUTH DAILY (WITH BREAKFAST) ZC:HYIF  . primidone (MYSOLINE) 50 MG tablet Take 2 tablets (100 mg total) by mouth at bedtime.  . sennosides-docusate sodium (SENOKOT-S) 8.6-50 MG tablet Take 1 tablet by mouth 2 (two) times daily.   Marland Kitchen umeclidinium bromide (INCRUSE ELLIPTA) 62.5 MCG/INH AEPB Inhale 1 puff into the lungs daily. (SHAKE  WELL)   No facility-administered encounter medications on file as of 01/01/2020.    Review of Systems  GENERAL: No appetite,  no fever, chills  MOUTH and THROAT: Denies oral discomfort, gingival pain or bleeding RESPIRATORY: no cough, SOB, DOE, wheezing, hemoptysis CARDIAC: No chest pain, edema or palpitations GI: No abdominal pain, diarrhea, constipation, heart burn, nausea or vomiting GU: Denies dysuria, frequency, hematuria or discharge NEUROLOGICAL: Denies dizziness, syncope, numbness, or headache PSYCHIATRIC: Denies feelings of depression or anxiety. No report of hallucinations, insomnia, paranoia, or agitation   Immunization History  Administered Date(s) Administered  . Influenza, High Dose Seasonal PF 10/04/2016  . Influenza,inj,Quad PF,6+ Mos 01/13/2016  . Influenza-Unspecified 11/10/2017, 10/09/2018, 10/31/2019  . Moderna Sars-Covid-2 Vaccination 01/28/2019, 02/25/2019  . Pneumococcal-Unspecified 10/11/2011  . Tdap 01/23/2016   Pertinent  Health Maintenance Due  Topic Date Due  . INFLUENZA VACCINE  Completed  . PNA vac Low Risk Adult  Completed  . DEXA SCAN  Discontinued  Fall Risk  11/05/2019 10/24/2018 07/19/2018 01/17/2018 11/09/2017  Falls in the past year? 1 0 0 0 No  Number falls in past yr: 0 0 0 0 -  Injury with Fall? 0 0 0 0 -  Comment - - - - -  Risk Factor Category  - - - - -  Risk for fall due to : History of fall(s);Impaired balance/gait;Impaired mobility - - - -  Follow up Falls evaluation completed;Education provided - - Falls evaluation completed -     Vitals:   01/01/20 1247  BP: 120/62  Pulse: 62  Resp: 18  Temp: 97.6 F (36.4 C)  TempSrc: Oral  Weight: 133 lb (60.3 kg)  Height: 5\' 8"  (1.727 m)   Body mass index is 20.22 kg/m.  Physical Exam  GENERAL APPEARANCE:  In no acute distress.  SKIN:  Skin is warm and dry. T MOUTH and THROAT: Lips are without lesions. Oral mucosa is moist and without lesions.  RESPIRATORY: Breathing is even  & unlabored, BS CTAB CARDIAC:  no murmur,no extra heart sounds, no edema GI: Abdomen soft, normal BS, no masses, no tenderness NEUROLOGICAL: There is no tremor. Speech is clear. PSYCHIATRIC: Affect and behavior are appropriate  Labs reviewed: Recent Labs    11/16/19 0058 11/17/19 0639 11/18/19 0208 11/26/19 0000  NA 135 135 138 141  K 4.4 4.3 4.1 4.2  CL 95* 95* 94* 102  CO2 32 32 35* 28*  GLUCOSE 83 90 128*  --   BUN 12 9 12 21   CREATININE 0.99 0.95 1.11* 1.0  CALCIUM 8.5* 8.7* 8.8* 8.5*   Recent Labs    11/16/19 0058 11/17/19 0639 11/18/19 0208 11/26/19 0000  AST 37 37 43* 45*  ALT 18 20 22 23   ALKPHOS 121 133* 140* 305*  BILITOT 0.8 1.0 0.7  --   PROT 5.9* 5.8* 5.8*  --   ALBUMIN 3.0* 2.9* 2.9* 3.2*   Recent Labs    11/16/19 0058 11/17/19 0639 11/18/19 0208 11/26/19 0000  WBC 4.7 4.6 6.9 3.8  NEUTROABS 3.2 3.4 5.1 2.80  HGB 9.6* 10.0* 10.2* 10.4*  HCT 29.5* 32.0* 33.1* 31*  MCV 89.4 91.4 92.7  --   PLT 202 189 212 236   Lab Results  Component Value Date   TSH 3.40 07/11/2019   Lab Results  Component Value Date   HGBA1C 5.2 12/01/2015   Lab Results  Component Value Date   CHOL 107 12/01/2015   HDL 27 (L) 12/01/2015   LDLCALC 59 12/01/2015   TRIG 107 12/01/2015   CHOLHDL 4.0 12/01/2015    Assessment/Plan  1. ACP (advance care planning) -  Remains to be full code -  Discussed medications, vital signs and weights  2. Small cell lung cancer, right middle lobe ([place]) -  On hospice care -  Continue supportive care and PRN Percocet  3. Hypothyroidism due to medication Lab Results  Component Value Date   TSH 3.40 07/11/2019   -  Continue Levothyroxine  4. Mixed type COPD (chronic obstructive pulmonary disease) (HCC) -  No wheezing, continue Incruise Ellipta INH, Prednisone and PRN Albuterol  5. Poor appetite -  Refusing weights, continue Ensure  6. SVT (supraventricular tachycardia) (HCC) -  Rate-controlled, continue Flecainide and  Metoprolol succinate ER     Family/ staff Communication:  Discussed plan of care with resident and IDT.  Labs/tests ordered:  None  Goals of care:   Long-term care/Hospice   Durenda Age, DNP, MSN, FNP-BC [facility] [phone] (Monday-Friday 8:00 a.m. - 5:00 p.m.) [phone] (after hours)

## 2020-01-11 DEATH — deceased
